# Patient Record
Sex: Male | Born: 1935 | Race: White | Hispanic: No | Marital: Married | Smoking: Former smoker
Health system: Southern US, Community
[De-identification: ages and names within clinical notes are randomized; demographics above are authoritative.]

## PROBLEM LIST (undated history)

## (undated) DIAGNOSIS — G4733 Obstructive sleep apnea (adult) (pediatric): Secondary | ICD-10-CM

## (undated) DIAGNOSIS — E785 Hyperlipidemia, unspecified: Secondary | ICD-10-CM

## (undated) DIAGNOSIS — I2510 Atherosclerotic heart disease of native coronary artery without angina pectoris: Secondary | ICD-10-CM

## (undated) DIAGNOSIS — I4821 Permanent atrial fibrillation: Secondary | ICD-10-CM

## (undated) DIAGNOSIS — Z7901 Long term (current) use of anticoagulants: Secondary | ICD-10-CM

## (undated) DIAGNOSIS — I4891 Unspecified atrial fibrillation: Secondary | ICD-10-CM

## (undated) DIAGNOSIS — I4892 Unspecified atrial flutter: Secondary | ICD-10-CM

## (undated) DIAGNOSIS — I255 Ischemic cardiomyopathy: Secondary | ICD-10-CM

## (undated) DIAGNOSIS — Z95810 Presence of automatic (implantable) cardiac defibrillator: Secondary | ICD-10-CM

## (undated) DIAGNOSIS — E119 Type 2 diabetes mellitus without complications: Secondary | ICD-10-CM

## (undated) DIAGNOSIS — J449 Chronic obstructive pulmonary disease, unspecified: Secondary | ICD-10-CM

## (undated) DIAGNOSIS — I509 Heart failure, unspecified: Secondary | ICD-10-CM

## (undated) DIAGNOSIS — N200 Calculus of kidney: Secondary | ICD-10-CM

## (undated) DIAGNOSIS — M1990 Unspecified osteoarthritis, unspecified site: Secondary | ICD-10-CM

## (undated) DIAGNOSIS — I609 Nontraumatic subarachnoid hemorrhage, unspecified: Secondary | ICD-10-CM

## (undated) DIAGNOSIS — Z9189 Other specified personal risk factors, not elsewhere classified: Secondary | ICD-10-CM

## (undated) DIAGNOSIS — I219 Acute myocardial infarction, unspecified: Secondary | ICD-10-CM

## (undated) DIAGNOSIS — I428 Other cardiomyopathies: Secondary | ICD-10-CM

## (undated) HISTORY — DX: Unspecified atrial fibrillation: I48.91

## (undated) HISTORY — DX: Obstructive sleep apnea (adult) (pediatric): G47.33

## (undated) HISTORY — PX: VASECTOMY: SHX75

## (undated) HISTORY — DX: Hyperlipidemia, unspecified: E78.5

## (undated) HISTORY — DX: Atherosclerotic heart disease of native coronary artery without angina pectoris: I25.10

## (undated) HISTORY — DX: Long term (current) use of anticoagulants: Z79.01

## (undated) HISTORY — PX: CATARACT EXTRACTION W/ INTRAOCULAR LENS  IMPLANT, BILATERAL: SHX1307

## (undated) SURGERY — CYSTOSCOPY, WITH CALCULUS REMOVAL USING BASKET
Anesthesia: Spinal | Laterality: Right

---

## 1976-04-08 HISTORY — PX: KNEE ARTHROPLASTY: SHX992

## 1996-04-08 DIAGNOSIS — I219 Acute myocardial infarction, unspecified: Secondary | ICD-10-CM

## 1996-04-08 HISTORY — DX: Acute myocardial infarction, unspecified: I21.9

## 1996-07-14 HISTORY — PX: CORONARY ANGIOPLASTY WITH STENT PLACEMENT: SHX49

## 2000-08-08 ENCOUNTER — Ambulatory Visit (HOSPITAL_COMMUNITY): Admission: RE | Admit: 2000-08-08 | Discharge: 2000-08-08 | Payer: Self-pay | Admitting: Neurology

## 2000-08-08 ENCOUNTER — Encounter: Payer: Self-pay | Admitting: Neurology

## 2001-10-15 ENCOUNTER — Encounter: Payer: Self-pay | Admitting: Emergency Medicine

## 2001-10-15 ENCOUNTER — Emergency Department (HOSPITAL_COMMUNITY): Admission: EM | Admit: 2001-10-15 | Discharge: 2001-10-15 | Payer: Self-pay | Admitting: Emergency Medicine

## 2003-01-23 ENCOUNTER — Emergency Department (HOSPITAL_COMMUNITY): Admission: EM | Admit: 2003-01-23 | Discharge: 2003-01-23 | Payer: Self-pay | Admitting: Emergency Medicine

## 2003-02-07 HISTORY — PX: CARDIAC CATHETERIZATION: SHX172

## 2003-02-08 ENCOUNTER — Ambulatory Visit (HOSPITAL_COMMUNITY): Admission: RE | Admit: 2003-02-08 | Discharge: 2003-02-09 | Payer: Self-pay | Admitting: Cardiovascular Disease

## 2003-09-13 ENCOUNTER — Ambulatory Visit (HOSPITAL_COMMUNITY): Admission: RE | Admit: 2003-09-13 | Discharge: 2003-09-13 | Payer: Self-pay | Admitting: Internal Medicine

## 2003-11-15 ENCOUNTER — Ambulatory Visit (HOSPITAL_COMMUNITY): Admission: RE | Admit: 2003-11-15 | Discharge: 2003-11-15 | Payer: Self-pay | Admitting: Family Medicine

## 2003-11-17 ENCOUNTER — Ambulatory Visit (HOSPITAL_COMMUNITY): Admission: RE | Admit: 2003-11-17 | Discharge: 2003-11-17 | Payer: Self-pay | Admitting: Family Medicine

## 2006-10-30 ENCOUNTER — Ambulatory Visit (HOSPITAL_COMMUNITY): Admission: RE | Admit: 2006-10-30 | Discharge: 2006-10-30 | Payer: Self-pay | Admitting: Family Medicine

## 2006-11-06 ENCOUNTER — Inpatient Hospital Stay (HOSPITAL_COMMUNITY): Admission: AD | Admit: 2006-11-06 | Discharge: 2006-11-08 | Payer: Self-pay | Admitting: Family Medicine

## 2006-11-07 ENCOUNTER — Ambulatory Visit: Payer: Self-pay | Admitting: Internal Medicine

## 2006-11-08 ENCOUNTER — Ambulatory Visit: Payer: Self-pay | Admitting: Gastroenterology

## 2006-11-12 ENCOUNTER — Encounter (HOSPITAL_COMMUNITY): Admission: RE | Admit: 2006-11-12 | Discharge: 2006-12-12 | Payer: Self-pay | Admitting: Internal Medicine

## 2007-04-09 HISTORY — PX: CHOLECYSTECTOMY: SHX55

## 2007-05-22 ENCOUNTER — Ambulatory Visit: Payer: Self-pay | Admitting: Cardiology

## 2007-06-19 ENCOUNTER — Ambulatory Visit: Payer: Self-pay | Admitting: Cardiology

## 2007-10-06 ENCOUNTER — Emergency Department (HOSPITAL_COMMUNITY): Admission: EM | Admit: 2007-10-06 | Discharge: 2007-10-06 | Payer: Self-pay | Admitting: Emergency Medicine

## 2007-10-16 ENCOUNTER — Emergency Department (HOSPITAL_COMMUNITY): Admission: EM | Admit: 2007-10-16 | Discharge: 2007-10-16 | Payer: Self-pay | Admitting: Emergency Medicine

## 2007-10-26 ENCOUNTER — Ambulatory Visit (HOSPITAL_COMMUNITY): Admission: RE | Admit: 2007-10-26 | Discharge: 2007-10-26 | Payer: Self-pay | Admitting: Family Medicine

## 2008-05-26 ENCOUNTER — Inpatient Hospital Stay (HOSPITAL_COMMUNITY): Admission: EM | Admit: 2008-05-26 | Discharge: 2008-05-27 | Payer: Self-pay | Admitting: Emergency Medicine

## 2008-08-10 ENCOUNTER — Ambulatory Visit (HOSPITAL_COMMUNITY): Admission: RE | Admit: 2008-08-10 | Discharge: 2008-08-10 | Payer: Self-pay | Admitting: Family Medicine

## 2008-08-14 ENCOUNTER — Observation Stay (HOSPITAL_COMMUNITY): Admission: EM | Admit: 2008-08-14 | Discharge: 2008-08-16 | Payer: Self-pay | Admitting: Emergency Medicine

## 2008-08-15 ENCOUNTER — Ambulatory Visit: Payer: Self-pay | Admitting: Gastroenterology

## 2008-08-16 ENCOUNTER — Ambulatory Visit: Payer: Self-pay | Admitting: Internal Medicine

## 2008-08-22 ENCOUNTER — Ambulatory Visit: Payer: Self-pay | Admitting: Cardiology

## 2008-08-22 ENCOUNTER — Encounter: Payer: Self-pay | Admitting: Physician Assistant

## 2008-08-23 ENCOUNTER — Ambulatory Visit (HOSPITAL_COMMUNITY): Admission: RE | Admit: 2008-08-23 | Discharge: 2008-08-23 | Payer: Self-pay | Admitting: Cardiology

## 2008-08-23 ENCOUNTER — Encounter: Payer: Self-pay | Admitting: Cardiology

## 2008-08-23 ENCOUNTER — Ambulatory Visit: Payer: Self-pay | Admitting: Cardiovascular Disease

## 2008-08-31 ENCOUNTER — Encounter (INDEPENDENT_AMBULATORY_CARE_PROVIDER_SITE_OTHER): Payer: Self-pay | Admitting: General Surgery

## 2008-08-31 ENCOUNTER — Ambulatory Visit: Payer: Self-pay | Admitting: Cardiology

## 2008-08-31 ENCOUNTER — Ambulatory Visit (HOSPITAL_COMMUNITY): Admission: RE | Admit: 2008-08-31 | Discharge: 2008-09-01 | Payer: Self-pay | Admitting: General Surgery

## 2008-09-23 ENCOUNTER — Encounter: Payer: Self-pay | Admitting: Gastroenterology

## 2008-10-07 DIAGNOSIS — E119 Type 2 diabetes mellitus without complications: Secondary | ICD-10-CM | POA: Insufficient documentation

## 2008-10-07 DIAGNOSIS — I2510 Atherosclerotic heart disease of native coronary artery without angina pectoris: Secondary | ICD-10-CM | POA: Insufficient documentation

## 2008-10-07 DIAGNOSIS — G4733 Obstructive sleep apnea (adult) (pediatric): Secondary | ICD-10-CM

## 2008-12-20 ENCOUNTER — Ambulatory Visit (HOSPITAL_COMMUNITY): Admission: RE | Admit: 2008-12-20 | Discharge: 2008-12-20 | Payer: Self-pay | Admitting: Ophthalmology

## 2009-01-03 ENCOUNTER — Ambulatory Visit (HOSPITAL_COMMUNITY): Admission: RE | Admit: 2009-01-03 | Discharge: 2009-01-03 | Payer: Self-pay | Admitting: Ophthalmology

## 2010-04-08 DIAGNOSIS — Z7901 Long term (current) use of anticoagulants: Secondary | ICD-10-CM

## 2010-04-08 HISTORY — DX: Long term (current) use of anticoagulants: Z79.01

## 2010-07-13 LAB — BASIC METABOLIC PANEL
BUN: 8 mg/dL (ref 6–23)
Calcium: 9.5 mg/dL (ref 8.4–10.5)
GFR calc non Af Amer: >60 mL/min (ref >60)
Glucose, Bld: 157 mg/dL — ABNORMAL HIGH (ref 70–99)

## 2010-07-13 LAB — GLUCOSE, CAPILLARY: Glucose-Capillary: 119 mg/dL — ABNORMAL HIGH (ref 70–99)

## 2010-07-17 LAB — POCT CARDIAC MARKERS
CKMB, poc: 3.8 ng/mL (ref 1.0–8.0)
Myoglobin, poc: 193 ng/mL (ref 12–200)
Troponin i, poc: <0.05 ng/mL (ref 0.00–0.09)

## 2010-07-17 LAB — BASIC METABOLIC PANEL
BUN: 18 mg/dL (ref 6–23)
CO2: 26 mEq/L (ref 19–32)
CO2: 30 mEq/L (ref 19–32)
CO2: 28 mEq/L (ref 19–32)
Calcium: 8.8 mg/dL (ref 8.4–10.5)
Chloride: 101 mEq/L (ref 96–112)
Chloride: 101 mEq/L (ref 96–112)
Chloride: 103 mEq/L (ref 96–112)
Creatinine, Ser: 0.88 mg/dL (ref 0.4–1.5)
Creatinine, Ser: 1.07 mg/dL (ref 0.4–1.5)
GFR calc Af Amer: >60 mL/min (ref >60)
GFR calc Af Amer: >60 mL/min (ref >60)
GFR calc non Af Amer: >60 mL/min (ref >60)
GFR calc non Af Amer: >60 mL/min (ref >60)
Glucose, Bld: 120 mg/dL — ABNORMAL HIGH (ref 70–99)
Glucose, Bld: 158 mg/dL — ABNORMAL HIGH (ref 70–99)
Potassium: 4.5 mEq/L (ref 3.5–5.1)
Potassium: 4.9 mEq/L (ref 3.5–5.1)
Potassium: 5.1 mEq/L (ref 3.5–5.1)
Sodium: 138 mEq/L (ref 135–145)

## 2010-07-17 LAB — HEPATIC FUNCTION PANEL
ALT: 40 U/L (ref 0–53)
Albumin: 3.7 g/dL (ref 3.5–5.2)
Albumin: 3.1 g/dL — ABNORMAL LOW (ref 3.5–5.2)
Alkaline Phosphatase: 43 U/L (ref 39–117)
Alkaline Phosphatase: 59 U/L (ref 39–117)
Bilirubin, Direct: 0.2 mg/dL (ref 0.0–0.3)
Bilirubin, Direct: 0.2 mg/dL (ref 0.0–0.3)
Bilirubin, Direct: 0.1 mg/dL (ref 0.0–0.3)
Indirect Bilirubin: 0.3 mg/dL (ref 0.3–0.9)
Indirect Bilirubin: 0.6 mg/dL (ref 0.3–0.9)
Total Bilirubin: 0.5 mg/dL (ref 0.3–1.2)
Total Bilirubin: 0.8 mg/dL (ref 0.3–1.2)
Total Bilirubin: 0.3 mg/dL (ref 0.3–1.2)

## 2010-07-17 LAB — COMPREHENSIVE METABOLIC PANEL
AST: 50 U/L — ABNORMAL HIGH (ref 0–37)
Albumin: 3.7 g/dL (ref 3.5–5.2)
Calcium: 9.8 mg/dL (ref 8.4–10.5)
Creatinine, Ser: 1.81 mg/dL — ABNORMAL HIGH (ref 0.4–1.5)
GFR calc Af Amer: 45 mL/min — ABNORMAL LOW (ref >60)
Total Protein: 5.8 g/dL — ABNORMAL LOW (ref 6.0–8.3)

## 2010-07-17 LAB — DIFFERENTIAL
Basophils Absolute: 0 10*3/uL (ref 0.0–0.1)
Basophils Relative: 0 % (ref 0–1)
Basophils Relative: 0 % (ref 0–1)
Eosinophils Absolute: 0.2 10*3/uL (ref 0.0–0.7)
Eosinophils Absolute: 0.2 10*3/uL (ref 0.0–0.7)
Eosinophils Relative: 2 % (ref 0–5)
Eosinophils Relative: 3 % (ref 0–5)
Eosinophils Relative: 1 % (ref 0–5)
Eosinophils Relative: 3 % (ref 0–5)
Lymphocytes Relative: 22 % (ref 12–46)
Lymphocytes Relative: 13 % (ref 12–46)
Lymphs Abs: 1.3 10*3/uL (ref 0.7–4.0)
Lymphs Abs: 2.9 10*3/uL (ref 0.7–4.0)
Lymphs Abs: 2.9 10*3/uL (ref 0.7–4.0)
Monocytes Absolute: 0.5 10*3/uL (ref 0.1–1.0)
Monocytes Absolute: 0.6 10*3/uL (ref 0.1–1.0)
Monocytes Absolute: 0.6 10*3/uL (ref 0.1–1.0)
Monocytes Absolute: 0.8 10*3/uL (ref 0.1–1.0)
Monocytes Relative: 8 % (ref 3–12)
Monocytes Relative: 11 % (ref 3–12)
Monocytes Relative: 9 % (ref 3–12)
Neutro Abs: 4.6 10*3/uL (ref 1.7–7.7)
Neutro Abs: 8.2 10*3/uL — ABNORMAL HIGH (ref 1.7–7.7)
Neutrophils Relative %: 53 % (ref 43–77)
Neutrophils Relative %: 54 % (ref 43–77)

## 2010-07-17 LAB — HEMOGLOBIN A1C
Hgb A1c MFr Bld: 9.2 % — ABNORMAL HIGH (ref 4.6–6.1)
Mean Plasma Glucose: 217 mg/dL
Mean Plasma Glucose: 217 mg/dL

## 2010-07-17 LAB — CBC
HCT: 38.5 % — ABNORMAL LOW (ref 39.0–52.0)
HCT: 41.5 % (ref 39.0–52.0)
Hemoglobin: 13.9 g/dL (ref 13.0–17.0)
Hemoglobin: 14.4 g/dL (ref 13.0–17.0)
Hemoglobin: 14.7 g/dL (ref 13.0–17.0)
MCHC: 35.5 g/dL (ref 30.0–36.0)
MCHC: 34.5 g/dL (ref 30.0–36.0)
MCHC: 34.7 g/dL (ref 30.0–36.0)
MCV: 99.2 fL (ref 78.0–100.0)
MCV: 99.3 fL (ref 78.0–100.0)
MCV: 99.5 fL (ref 78.0–100.0)
Platelets: 156 10*3/uL (ref 150–400)
RBC: 4.34 MIL/uL (ref 4.22–5.81)
RBC: 4.18 MIL/uL — ABNORMAL LOW (ref 4.22–5.81)
RBC: 4.21 MIL/uL — ABNORMAL LOW (ref 4.22–5.81)
RDW: 13.5 % (ref 11.5–15.5)
RDW: 13.2 % (ref 11.5–15.5)
WBC: 10.1 10*3/uL (ref 4.0–10.5)
WBC: 7.4 10*3/uL (ref 4.0–10.5)

## 2010-07-17 LAB — GLUCOSE, CAPILLARY
Glucose-Capillary: 120 mg/dL — ABNORMAL HIGH (ref 70–99)
Glucose-Capillary: 125 mg/dL — ABNORMAL HIGH (ref 70–99)
Glucose-Capillary: 174 mg/dL — ABNORMAL HIGH (ref 70–99)
Glucose-Capillary: 157 mg/dL — ABNORMAL HIGH (ref 70–99)
Glucose-Capillary: 160 mg/dL — ABNORMAL HIGH (ref 70–99)
Glucose-Capillary: 162 mg/dL — ABNORMAL HIGH (ref 70–99)

## 2010-07-17 LAB — PROTIME-INR
INR: 1.1 (ref 0.00–1.49)
Prothrombin Time: 14.4 seconds (ref 11.6–15.2)

## 2010-07-17 LAB — URINALYSIS, ROUTINE W REFLEX MICROSCOPIC
Nitrite: NEGATIVE
Protein, ur: NEGATIVE mg/dL
Specific Gravity, Urine: >1.03 — ABNORMAL HIGH (ref 1.005–1.030)
Urobilinogen, UA: 0.2 mg/dL (ref 0.0–1.0)

## 2010-07-17 LAB — LIPASE, BLOOD: Lipase: 18 U/L (ref 11–59)

## 2010-08-21 NOTE — Group Therapy Note (Signed)
NAMEMARGIE, BRINK                  ACCOUNT NO.:  [id number]   MEDICAL RECORD NO.:  [id number]          PATIENT TYPE:  INP   LOCATION:  IC02                          FACILITY:  [facility]   PHYSICIAN:  [name], MD   DATE OF BIRTH:  [birth year]   DATE OF PROCEDURE:  DATE OF DISCHARGE:                                 PROGRESS NOTE   This patient was admitted with bronchitis, COPD, subconjunctival  hemorrhage left eye.  He continues to have some cough and congestion,  upper respiratory passages   OBJECTIVE:  VITAL SIGNS:  Blood pressure 101/58, respirations 18, pulse  71, temperature 98.  LUNGS:  Rhonchi and wheezes bilaterally.  HEART:  Regular rhythm.  ABDOMEN:  No palpable organs or masses.   ASSESSMENT:  The patient was admitted with subconjunctival hemorrhage  left eye, bronchitis, and emphysema.   PLAN:  To continue current regimen.      [name], MD  Electronically Signed     AGM/MEDQ  D:  05/27/2008  T:  05/27/2008  Job:  [id number]

## 2010-08-21 NOTE — H&P (Signed)
[name], [name]                  ACCOUNT NO.:  [id number]   MEDICAL RECORD NO.:  [id number]           PATIENT TYPE:  INP   LOCATION:  IC02                          FACILITY:  [facility]   PHYSICIAN:  [name], MD   DATE OF BIRTH:  Aug 26, 1934   DATE OF ADMISSION:  DATE OF DISCHARGE:  LH                              HISTORY & PHYSICAL   A 76 year old white male was brought to the hospital with pain in left  eye and left ear and upper respiratory congestion.  He was seen in  emergency room and evaluated.  Because of his breathing problems, it was  felt he should be admitted to the hospital.  X-rays of chest showed  chronic changes without acute pulmonary disease, borderline heart size.  CT of the head, no acute intracranial abnormality, mild atrophy and  chronic ischemic white matter disease, and chronic right mastoiditis.  The patient has been coughing some as well.   SOCIAL HISTORY:  The patient does not smoke now or drink.   PAST MEDICAL HISTORY:  The patient has a history of coronary artery  disease, hypertension, and previous myocardial infarction.   SURGICAL HISTORY:  The patient has history of coronary stent, knee  surgery, and vasectomy.   ALLERGIES:  NOVOCAIN, nausea and vomiting.   MEDICATION LIST:  1. Metoprolol 25 mg b.i.d.  2. Niaspan 500 mg daily.  3. Simvastatin 40 mg daily.  4. Plavix 75 mg daily.  5. Vitamin E 400 mg daily.  6. Lisinopril 20 mg daily.  7. Fish oil 1000 mg daily.  8. Robitussin every 4-8 hours.   REVIEW OF SYSTEMS:  HEENT:  Negative.  CARDIOPULMONARY:  The patient has  had increased cough and dyspnea for several days.  GI:  No bowel  irregularity or bleeding.  GU:  No dysuria or hematuria.   PHYSICAL EXAMINATION:  VITAL SIGNS:  Blood pressure 101/58, respirations  18, pulse 71, temperature 98.  HEENT:  Eyes:  PERRLA.  The patient has subconjunctival hemorrhage in  the left eye.  NECK:  Supple.  No JVD or thyroid abnormalities.  HEART:   Regular rhythm.  No murmurs.  LUNGS:  Rhonchi and wheezes bilaterally.  ABDOMEN:  No palpable organs or masses.   ASSESSMENT:  The patient was admitted to hospital with:  1. Subconjunctival hemorrhage, left eye.  2. Upper respiratory infection.  3. Bronchitis, asthmatic type.  4. Left base atelectasis.  5. Chronic obstructive pulmonary disease.      [name], MD  Electronically Signed     AGM/MEDQ  D:  05/27/2008  T:  05/27/2008  Job:  [id number]

## 2010-08-21 NOTE — Consult Note (Signed)
NAMEBALRAJ, [name]                  ACCOUNT NO.:  [id number]   MEDICAL RECORD NO.:  [id number]          PATIENT TYPE:  INP   LOCATION:  A206                          FACILITY:  [facility]   PHYSICIAN:  R. Roetta Sessions, M.D. DATE OF BIRTH:  1934-10-20   DATE OF CONSULTATION:  11/07/2006  DATE OF DISCHARGE:                                 CONSULTATION   REASON FOR CONSULTATION:  Epigastric pain, nausea, vomiting.   HISTORY OF PRESENT ILLNESS:  [name] is a very pleasant 76-year-  old Caucasian male who was in his usual state of health until three days  ago when he developed rather acute onset of epigastric medial right  upper quadrant abdominal pain with nausea and belching.  He denies  actually frank vomiting, although he was nauseated.  Symptoms have waxed  and waned over the past couple of days.  States he has been unable to  eat.  When he eats, these symptoms worsen.  He has not had any melena or  hematochezia.  He had a couple of loose stools but denies any  significant watery diarrhea.  He has not had a fever or chills.  Denies  yellow jaundice, clay-colored stools, dark-colored urine.  He had  apparently an upper respiratory tract infection with some congestion a  week earlier, which resolved.   Prior to this week, [name] denies any problems with abdominal pain,  nausea, vomiting, or change in bowel habits.  His appetite previously  has been well maintained, and he continues to be hungry now, although he  has distress if he tries to eat as outlined above.  Back in 2005 he had  a gallbladder ultrasound which was negative.  A CT abdomen and pelvis  this admission failed to demonstrate anything acute or anything that  would explain his current symptoms.  He did have a fatty-appearing liver  on the scan.  He denies reflux symptoms, odynophagia and dysphagia.  There is no history of peptic ulcer disease.  There has been no change  in medications.  He denies NSAID use aside from  one aspirin a day.  The  has been taking Niaspan for the past three years but there has been no  change in that regimen.   Admitting laboratory data did reveal slightly elevated white count of  12.5, H&H of 16.5 and 49.3.  His LFTs were all normal except for  slightly elevated AST of 50 on 11/06/2006.  Cardiac enzymes were normal.  PSA normal at 1.16.   PAST MEDICAL HISTORY:  Coronary artery disease, status post MI in 1998.  He has multivessel coronary disease.   PAST SURGICAL HISTORY:  Left knee surgery in 1970, vasectomy in 1968,  colonoscopy by me with removal of multiple colonic polyps 2005, both  adenomatous and inflammatory.  He is due for surveillance exam in 2010.   MEDICATIONS ON ADMISSION:  Vytorin, Niaspan, aspirin, isosorbide,  verapamil, Plavix.   DRUG ALLERGIES:  No known drug allergies.   FAMILY HISTORY:  Negative for chronic GI or liver illness.   SOCIAL HISTORY:  Patient  does not smoke.  He does not use alcohol or  illicit drugs.  He is married and has a very supportive family.   REVIEW OF SYSTEMS:  Has had recent dyspnea and chest pain on exertion.  No fever or chills.  No change in weight.  Has no clay-colored stools,  dark-colored urine.   PHYSICAL EXAMINATION:  GENERAL:  Alert, conversant gentleman in no acute  distress, accompanied by numerous family members in Room 206.  VITAL SIGNS:  Admission weight 94.9 kg.  Temperature 97.1, pulse 62,  respirations 16, blood pressure 117/63, O2 saturation 95% on room air.  HEENT:  No scleral icterus.  Conjunctivae are pink.  JVD is not  prominent.  Oral cavity with no lesions.  CHEST:  Lungs are clear to auscultation.  CARDIAC:  Regular rate and rhythm without murmur, gallop or rub.  ABDOMEN:  Obese.  Positive bowel sounds.  He has localized  epigastric/right upper quadrant tenderness to palpation.  No obvious  mass or organomegaly.  EXTREMITIES:  No edema.   ADDITIONAL LABORATORY DATA:  Sodium 132, potassium 4.7,  chloride 94, CO2  of 29, glucose 197, BUN 20, creatinine 0.097.  Bilirubin 0.8, alkaline  phosphatase 61, AST 47, total protein 7.1, albumin 4.0.  H&H of 16.5,  49.3, MCV 97.3, platelet count 267,000.   IMPRESSION:  [name] is a very pleasant 76 year old gentleman  admitted to the hospital with fairly acute onset of nausea, vomiting,  epigastric right upper quadrant abdominal pain.  Certainly his symptoms  are exacerbated by eating.  I find his symptoms suspicious for occult  biliary tract disease.  CT of the abdomen did not demonstrate any  obvious explanation for his symptoms.  I doubt he has luminal pathology  in his upper GI tract to account for his symptoms.  He has isolated  elevation of his SGOT which is fairly nonspecific.  There is no evidence  of pancreatitis on recent CT scanning.   RECOMMENDATIONS:  Proceed with a gallbladder ultrasound to evaluate for  occult gallbladder disease.  CT may miss a good 30% of stone disease.  Will go ahead and recheck a hepatic profile now and obtain a serum  amylase and lipase.  He has been started on Protonix 40 mg IV q.12h.  I  agree with this approach for the time being.  Hopefully we can drop him  back to once daily therapy in the near future.  Further recommendations  to follow.  Dr. Cira Servant will be looking in on [name] as needed in my  absence over the weekend.   I would like to thank Dr. Butch Penny for allowing me to see this nice  gentleman.      [name], M.D.  Electronically Signed     RMR/MEDQ  D:  11/07/2006  T:  11/07/2006  Job:  [id number]   cc:   [name], MD  Fax: [phone]

## 2010-08-21 NOTE — Group Therapy Note (Signed)
NAMEMONICA, [name]                  ACCOUNT NO.:  [id number]   MEDICAL RECORD NO.:  [id number]          PATIENT TYPE:  INP   LOCATION:  A206                          FACILITY:  [facility]   PHYSICIAN:  [name], MD   DATE OF BIRTH:  [birth year]   DATE OF PROCEDURE:  DATE OF DISCHARGE:                                 PROGRESS NOTE   This patient was admitted to hospital yesterday with episodes of nausea,  vomiting and mild epigastric pain.  This has been persistent for several  days and he continues to have nausea and episodes of vomiting.   OBJECTIVE:  VITAL SIGNS:  Blood pressure 118/74, respirations 24,  temperature 96.9.   The patient's lab work: CBC 12.5, hemoglobin 16.5, hematocrit 49.3.  Chemistries: Sodium 132, potassium 4.7, chloride 94, CO2 29, glucose  197, BUN 20, creatinine 0.97.  Liver enzymes within normal limits.  Cardiac markers within normal limits.   LUNGS:  Diminished breath sounds bilaterally.  HEART:  Regular rhythm.  ABDOMEN:  Slightly distended with fullness in right upper quadrant.   ASSESSMENT:  Patient admitted with abdominal pain, nausea, vomiting and  He does have a slightly low serum potassium, slightly elevated white  count. Abdominal CT scans pending.   PLAN:  Continue current regimen.  Will obtain reports on abdominal CT  scan, continue IV fluids.  Will replace potassium.      [name], MD  Electronically Signed     AGM/MEDQ  D:  11/07/2006  T:  11/07/2006  Job:  [id number]

## 2010-08-21 NOTE — Consult Note (Signed)
NAMEDONALD, [name]                  ACCOUNT NO.:  [id number]   MEDICAL RECORD NO.:  [id number]          PATIENT TYPE:  INP   LOCATION:  A328                          FACILITY:  [facility]   PHYSICIAN:  Kassie Mends, M.D.      DATE OF BIRTH:  08-10-1934   DATE OF CONSULTATION:  08/15/2008  DATE OF DISCHARGE:                                 CONSULTATION   REFERRING PHYSICIAN:  Angus G. McInnis, MD.   REASON FOR CONSULTATION:  Right upper quadrant pain, nausea and  vomiting.   HISTORY OF PRESENT ILLNESS:  The patient is a pleasant 76 year old obese  Caucasian gentleman who was admitted with a 2-3 day history of  persistent nausea, vomiting and right upper quadrant pain.  He states he  has had some right upper quadrant pain which radiates around the right  flank and into his back for several months.  The family says up to a  year.  He tells me usually his pain is brought on by movement.  He has  more pain if he goes down stairs stepping with the right foot first.  He  has more pain with bowel movements and when he tries to reach around and  clean himself.  He really denies a postprandial component.  He does  usually have some nausea associated with the pain, according to him and  the family.  More recently, his symptoms have been frequent.  He has had  a couple of episodes that seem to be intensified postprandially.  He had  a gallbladder ultrasound last week by Dr. [name] and had a borderline  thick gallbladder wall but no pericholecystic fluid or Murphy's sign.  He also had right rib films which were negative for any significant  findings.  The patient had some blood work as an outpatient but we do  not have these for review at this time.  Friday evening he states that  he went to [employer] and had burgers, fries, apple pie.  He went to bed  that evening.  He woke up the next morning to do a job and felt sick on  his stomach.  He felt more nauseated throughout the day.  He eventually  ate  a hotdog and shortly after that he started vomiting.  He vomited  several times on Saturday.  Sunday he tried to go out to eat for  Mother's Day and shortly after he started having dry heaves.  He noted  that his urine was very dark colored and malodorous.  He denies any  burning or gross hematuria.  He has had a history of urinary frequency  and incontinence, but states that Vesicare has been helpful in  controlling those symptoms.  He is currently on sulfamethoxazole for 14  days for a possible prostate infection.   He denies any fever or chills.  He presented to the emergency department  with intractable vomiting and diaphoresis.  Upon evaluation he was found  to have a normal white count and hemoglobin.  His AST was slightly high  at 50 and this has been seen  back in 2008 as well.  He has a history of  fatty liver.  His creatinine was up at 1.81, his glucose was 298, his  sodium 127.  Lipase was normal.  Today, his sodium is better at 131, his  creatinine is down to 1.18, his glucose is 158.  He had a urinalysis  that showed small bilirubin, trace ketones.  He had a CT this morning  but results are pending.   MEDICATIONS AT HOME:  1. Niaspan 500 mg nightly.  2. Sulfamethoxazole b.i.d.  3. Zocor 40 mg daily.  4. Oxycodone 5/325 mg every 4 hours as needed.  5. Plavix 75 mg daily.  6. Lisinopril 20 mg daily.  7. Metoprolol 25 mg b.i.d.  8. Vesicare 5 mg daily.  9. Fish Oil 1000 mg daily.  10.Vitamin E 400 IU daily.   ALLERGIES:  NOVOCAIN causes nausea and vomiting.   PAST MEDICAL HISTORY:  1. Hypertension.  2. Hyperlipidemia.  3. CAD status post MI in 1998 with stenting in 1998 and 2004.  4. Apparently, diabetes undiagnosed.  5. Knee surgery in the 1970s.  6. Vasectomy.  7. Admission in February 2010 for bronchitis.  8. Colonoscopy in 2005, he had adenomatous polyps and is due for      surveillance in 2010.  The patient states he had a colonoscopy last      year, but I am  not sure if this is true or not.   FAMILY HISTORY:  Negative for chronic GI illnesses, liver disease.   SOCIAL HISTORY:  He is married, he has 3 adult children.  He quit  smoking in 1994.  No alcohol use.  He works with sound systems locally.   REVIEW OF SYSTEMS:  See HPI for GI.  In addition, he denies any  heartburn, dysphagia, or odynophagia.  His bowel movements generally  occur twice daily.  No blood in the stool or melena.  GU:  See HPI.  CARDIOPULMONARY:  Denies shortness of breath or chest pain.   PHYSICAL EXAMINATION:  Temperature 98, pulse 72, respirations 20, blood  pressure is 93/55, O2 saturations 96% on room air.  Height 64 inches,  weight 94.3 kg.  GENERAL:  A pleasant, obese Caucasian gentleman in no acute distress.  He is accompanied by his wife, daughter and grandson.  SKIN:  Warm and dry, no jaundice.  HEENT:  Sclerae nonicteric.  Oropharyngeal mucosa moist and pink.  No  lesions, erythema or exudate.  No lymphadenopathy, thyromegaly.  CHEST:  Lungs are clear to auscultation.  CARDIAC EXAM:  Reveals regular rate and rhythm.  No murmurs, rubs or  gallops.  ABDOMEN:  Obese, positive bowel sounds.  He has ventral weakness with  some herniation, nontender, easily reducible.  He has mild right upper  quadrant tenderness to deep palpation.  Do not appreciate organomegaly  or masses but limited due to body habitus.  No abdominal bruits.  LOWER EXTREMITIES:  No edema.   LABS:  As outlined above.   IMPRESSION:  The patient is a 76 year old gentleman with right upper  quadrant abdominal discomfort, nausea and vomiting.  His right upper  quadrant abdominal pain has been occurring for several months, often  associated with movement or bowel movement.  More recently, may have  been associated with meals.  He has had recent right upper quadrant pain  which he describes as mild, associated with intractable vomiting.  Differential diagnosis at this point of his abdominal pain  would include  musculoskeletal pain,  underlying gallbladder disease.  Nausea, vomiting  may be related to foodborne illness for recurrent episode or even  underlying gastroparesis, biliary disease.  Recent abdominal ultrasound  failed to show acute cholecystitis.  He had a borderline gallbladder  wall thickness, but no other signs to indicate acute cholecystitis.   He appears to have undiagnosed diabetes.  His glucose was up on  admission and was up back in 2008 as well.   Fatty liver with elevated AST in the 50 range.  This was also 50 in July  of 2008.   RECOMMENDATIONS:  1. Will follow up CT findings and make further recommendations.  Based      on findings, he may need to have a HIDA scan plus or minus EGD to      sort out his symptoms.  2. Hemoglobin A1c.  3. PPI.  4. Will recheck his LFTs in the morning.   I would like to thank Dr. Butch Penny for allowing [place] to take part in  the care of this patient.   ADDENDUM [id number]:  OPV in AUG 2010, RE: abd pain      Tana Coast, P.A.      Kassie Mends, M.D.  Electronically Signed    LL/MEDQ  D:  08/15/2008  T:  08/15/2008  Job:  [id number]   cc:   [name], MD  Fax: [phone]

## 2010-08-21 NOTE — Group Therapy Note (Signed)
[name], [name]                  ACCOUNT NO.:  [id number]   MEDICAL RECORD NO.:  [id number]          PATIENT TYPE:  INP   LOCATION:  A328                          FACILITY:  [facility]   PHYSICIAN:  [name], MD   DATE OF BIRTH:  12-25-1934   DATE OF PROCEDURE:  DATE OF DISCHARGE:                                 PROGRESS NOTE   This patient was admitted to the hospital with abdominal pain, nausea,  and vomiting.  Pain this morning has resolved some.  Pain was  characterized as right upper quadrant pain, sharp, and stabbing.  He did  apparently have elevated blood sugar early as well but on today's labs  sugar was 158, it had been 298 on admission.  CBC, WBC 10,100 with  hemoglobin 15.5, hematocrit 44.9.  A prior ultrasound showed borderline  wall thickening and gallbladder with absent Murphy sign.   OBJECTIVE:  VITAL SIGNS:  Blood pressure 98/68, respirations 20, pulse  72, temp 97.0.  LUNGS:  Clear to P&A.  HEART:  Regular rhythm.  No murmurs.  ABDOMEN:  No palpable organs or masses.   ASSESSMENT:  The patient was admitted with upper abdominal pain right  upper quadrant, acute gastritis to rule out underlying gallbladder  disease.   PLAN:  To obtain GI consult.  Continue current intravenous fluids.      [name], MD  Electronically Signed     AGM/MEDQ  D:  08/15/2008  T:  08/15/2008  Job:  [id number]

## 2010-08-21 NOTE — Letter (Signed)
May 22, 2007    [name], MD  [address]  [place], [place] [address]   RE:  CEVIN, RUBINSTEIN  MRN:  [id number]  /  DOB:  [birth year]   Dear Thalia Party:   Mr. Tumbleson was evaluated in the office in consultation today at your kind  request.  As you know, this nice gentleman presented with an acute  myocardial infarction in 1998 requiring stent placement in the proximal  LAD and percutaneous angioplasty of the circumflex.  An abnormal stress  test in 2004 lead to repeat coronary angiography revealing a mid 95% LAD  lesion and a 70% RCA stenosis.  He was subsequently scheduled for  intervention.  At that time, there had been substantial spontaneous  improvement in the LAD.  A stent was placed in the RCA.  He has done  well with no cardiopulmonary symptoms since.  He discontinued followup  with [facility] as the result of a billing dispute.   His cardiovascular risk factors include only dyslipidemia and obesity.  He does not recall being told of hypertension.  He has had no problems  with diabetes.  He was last admitted to the hospital in mid 2008 with GI  symptoms.  These resolved spontaneously.  Admission labs at that time  revealed sodium of 132, glucose of 197, normal LFTs and normal cardiac  markers.   PAST MEDICAL HISTORY:  Past medical history is notable for a vasectomy  and left knee surgery in 1970.   ALLERGIES:  The patient reports an allergy to NOVOCAIN.   CURRENT MEDICATIONS:  1. Include simvastatin 40 mg daily.  2. Niaspan 500 mg daily at bedtime.  3. Verapamil 120 mg daily.  4. Metoprolol 25 mg b.i.d.  5. Isosorbide 20 mg b.i.d.  6. Clopidogrel 75 mg daily.  7. Lisinopril 20 mg daily.   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  He exercises regularly  at the gym.  He is married with 3 adult children.   FAMILY HISTORY:  Father died at advanced age with myocardial  infarction.  Mother died in her 98s with congestive heart failure.  Of 8 siblings,  all are deceased.  Three had coronary disease, 1 neoplastic disease and  1 pneumonia.   REVIEW OF SYSTEMS:  Is positive for the need for corrective lenses,  stable weight and appetite.  All other systems reviewed and are  negative.   PHYSICAL EXAMINATION:  GENERAL:  On exam, a pleasant, garrulous  gentleman in no acute distress.  VITAL SIGNS:  The weight is 223, blood pressure 100/70, heart rate 75  and regular, respirations 18.  NECK:  No jugular venous distention; normal carotid upstrokes without  bruits.  HEENT:  Anicteric sclerae; normal lids and conjunctivae; normal oral  mucosa.  LUNGS:  Clear.  CARDIOVASCULAR:  Normal first and second heart sounds; fourth heart  sound present.  ABDOMEN:  Soft and nontender; obese; no organomegaly.  EXTREMITIES:  No edema; distal pulses intact.  NEUROLOGICAL:  Symmetric strength and tone; normal cranial nerves.  SKIN:  No significant lesions.  ENDOCRINE:  No thyromegaly.  HEMATOPOIETIC:  No adenopathy.   EKG:  Normal sinus rhythm; borderline left atrial abnormality; prior  anteroseptal myocardial infarction.  No prior tracing for comparison.   IMPRESSION:  Mr. Fitzgerald is doing quite well overall.  In the absence of a  history of hypertension and with a quite low  blood pressure and no  cardiac symptoms, he is probably taking more medication than is  absolutely necessary.  We will stop isosorbide and verapamil and have  him monitor blood pressure.  A last fasting lipid profile and metabolic  profile will be  obtained as well as a hemoglobin A1c level.  I will reassess this nice  gentleman in 1 month and then annually thereafter.   Thanks so much for sending him to see me.    Sincerely,      [name], MD, [facility]  Electronically Signed    RMR/MedQ  DD: 05/22/2007  DT: 05/24/2007  Job #: [id number]

## 2010-08-21 NOTE — Group Therapy Note (Signed)
NAMEARISTIDIS, TALERICO                  ACCOUNT NO.:  [id number]   MEDICAL RECORD NO.:  [id number]          PATIENT TYPE:  INP   LOCATION:  A206                          FACILITY:  [facility]   PHYSICIAN:  [name], MD   DATE OF BIRTH:  December 22, 1934   DATE OF PROCEDURE:  DATE OF DISCHARGE:  11/08/2006                                 PROGRESS NOTE   This patient was admitted to hospital with episodes of nausea,  eructation, and epigastric discomfort.  The patient has had a CT, which  was essentially normal.  Also ultrasound of abdomen was essentially  normal with no evidence of gallstones.  Lipase was 25, amylase was 42.  The patient has been seen by gastroenterology service as well remains on  IV fluids.  Seems to be feeling some better.   OBJECTIVE:  VITAL SIGNS:  Blood pressure 128/80, respirations 24, pulse  72, temperature 97.2.  LUNGS:  Clear to P&A.  HEART:  Regular rhythm.  ABDOMEN:  No palpable organs or masses   ASSESSMENT:  Patient admitted with vomiting, epigastric discomfort,  probable gastritis.  The patient does have a history of coronary artery  disease and myocardial infarction in 1998.   PLAN:  To advance diet and attempt to get patient discharged.      [name], MD  Electronically Signed     AGM/MEDQ  D:  11/08/2006  T:  11/08/2006  Job:  [id number]

## 2010-08-21 NOTE — Group Therapy Note (Signed)
NAMEZACHARIUS, FUNARI                  ACCOUNT NO.:  [id number]   MEDICAL RECORD NO.:  [id number]          PATIENT TYPE:  INP   LOCATION:  A328                          FACILITY:  [facility]   PHYSICIAN:  [name], MD   DATE OF BIRTH:  1934-05-24   DATE OF PROCEDURE:  DATE OF DISCHARGE:                                 PROGRESS NOTE   The patient was admitted to the hospital with abdominal pain, nausea,  and vomiting.  Pain was characterized as right upper quadrant pain,  sharp, and stabbing.  He did have elevated blood sugar.  The patient is  showing improvement.  He did have CT of the abdomen done yesterday,  which showed fatty infiltration of liver.  No acute intra-abdominal  abnormalities.  Prior ultrasound showed thickening of gallbladder wall  without evidence of stones.   OBJECTIVE:  VITAL SIGNS:  Blood pressure 101/58, respiration 20, pulse  65, and temp 97.6.   LABORATORY DATA:  WBC 7.4 with 14.4 hemoglobin and 41.5 hematocrit.  Sugars ranged from 157-186.  Chemistries remain within normal range.  Hemoglobin A1c is 9.2.   ASSESSMENT:  The patient is being evaluated for right upper quadrant  pain, recent acute nausea and vomiting, rule out underlying gallbladder  disease.  The patient is being scheduled for hepatobiliary scan today.  Continue current regimen, otherwise.      [name], MD  Electronically Signed     AGM/MEDQ  D:  08/16/2008  T:  08/16/2008  Job:  [id number]

## 2010-08-21 NOTE — Letter (Signed)
June 19, 2007    [name], MD  [address]  [place], [place] [address]   RE:  [name], [name]  MRN:  [id number]  /  DOB:  1934-06-16   Dear Thalia Party:   Mr. Able returns to the office for continued management of  cardiovascular risk factors.  Since his last visit, he has done well.  He has recorded blood pressures with systolics ranging from 120-140 and  diastolics from 70-91.  He notes no symptoms.  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  He can walk hills or walk  up a flight of stairs.  He does yard work without difficulty.   MEDICATIONS:  Unchanged from his last visit except for discontinuation  of verapamil and isosorbide.   PHYSICAL EXAMINATION:  VITAL SIGNS:  Weight is 228, 5 pounds more than  at his last visit.  Blood pressure 100/70, heart rate 65 and regular,  respirations 18.  NECK:  No jugular venous distention; no carotid bruits.  LUNGS:  Clear.  CARDIAC:  Normal first and second heart sounds; fourth heart sound  present.  ABDOMEN:  Obese; otherwise benign.  EXTREMITIES:  No edema.   IMPRESSION:  Mr. Barham is doing well on an abbreviated medical regime.  I am not inclined to reduce his medications further.  Control of  hyperlipidemia is excellent.  I suggested that he  discontinue vitamin E and we will plan to see this nice gentleman again  in 1 year.  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  I suspect he will need a stress test, but he can  take that up with the examiner should he proceed with an application for  a commercial driver's license.    Sincerely,      [name], MD, [facility]  Electronically Signed    RMR/MedQ  DD: 06/19/2007  DT: 06/20/2007  Job #: [id number]

## 2010-08-21 NOTE — H&P (Signed)
NAMERAYWOOD, WAILES                  ACCOUNT NO.:  [id number]   MEDICAL RECORD NO.:  [id number]          PATIENT TYPE:  INP   LOCATION:  A328                          FACILITY:  [facility]   PHYSICIAN:  Mila Homer. Sudie Bailey, M.D.DATE OF BIRTH:  [birth year]   DATE OF ADMISSION:  08/14/2008  DATE OF DISCHARGE:  LH                              HISTORY & PHYSICAL   HISTORY OF PRESENT ILLNESS:  This 76 year old had developed right upper  quadrant abdominal pain mainly noticed after eating, starting at least 3-  6 months prior to admission.  Some of this may have gone on longer since  he was admitted to this hospital in July 2008 with nausea, vomiting, and  epigastric discomfort.  However, at that time it was felt that he had  gastritis and gastroduodenitis.  At that time, his gallbladder  ultrasound was negative and a HIDA scan was discussed.   The patient did see Dr. [name], his LMD, in the office about 5 days  ago, blood work and a gallbladder ultrasound were ordered.  This showed  diffuse fatty infiltration of the liver, and borderline thickening of  the bladder wall without sonographic Murphy sign or pericholecystic  fluid.  He was due to have a followup with Dr. [name] 2 days ago,  particularly if symptoms persisted, however, the patient deferred on  this and then that night developed nausea and vomiting along with the  abdominal pain that persisted yesterday and it was so severe today that  his daughter called me, Sunday morning Mother's Day, and conveyed his  symptoms to me.  In my function as on-call physician for Dr. [name], I  recommended he be seen immediately at [facility]  Emergency Department for further workup.   The patient has a fairly complicated medical history.  He had a major  anterior myocardial infarction in 1998.  He had left anterior descending  stenting for this.  He has had left ventricular dysfunction with  ejection fraction of 30%, no evidence of  stress test in May 2000.  In  2004, he also had abnormal Cardiolite study and found to have 70% right  coronary artery blockage, treated with a CYPHER stent.   Other medical problems include benign essential hypertension,  hyperlipidemia, and obesity.   The patient gives more symptoms today.  He said he has been peeing all  over myself.  The last apparently few months he was seen by Urology for  workup of this last week.  He has been thirsty.  He has had polyuria and  polydipsia.  He also notes he had a sister who had diabetes, went on  developing an infection of her toe, lost her foot, and eventually died  of complications of diabetes.   Other than being weak, he has had no CNS symptomatology.  He notes he  has for some time had 5  bowel movements a day.  He has had urinary  symptoms as mentioned above.  He has had no real problem with starting  and stopping his urine, but has had more of a  problem with holding  urine, but urinates copiously.  He has had no palpitations or chest pain  along with all of this.  His lungs have done well.   CURRENT MEDICATIONS:  1. Metoprolol 25 mg b.i.d.  2. Niaspan 500 mg nightly.  3. Simvastatin 40 mg nightly.  4. Plavix 75 mg daily.  5. Vitamin E 400 mg daily.  6. Lisinopril 20 mg daily.  7. Fish oil 1000 mg daily.  8. Oxycodone and acetaminophen 5/325 q.4 h. pain.  9. Septra.   PHYSICAL EXAMINATION:  GENERAL:  Pleasant, 76 year old man who looked  younger than his stated age.  He is a good historian.  His affect was  normal.  Speech normal.  Sentence structure intact.  VITAL SIGNS:  Temperature is 97.7, blood pressure 103/53, pulse 66,  respiratory rate 20, and O2 sat is 98% on room air.  HEENT:  Mucous membranes appeared to be moist.  SKIN:  Turgor was normal.  HEART:  Regular rhythm, rate of about 80.  LUNGS:  Appeared clear throughout.  He is moving air well.  ABDOMEN:  Soft and obese without definite organomegaly or mass.  He was   tender on palpation on the right upper quadrant.  Bowel sounds are  normal.  EXTREMITIES:  He had 2+ dorsalis pedis pulses bilaterally.  No edema of  the ankles and feet.   His white cell count today is 10,100 with 80% neutrophils, 13 lymphs.  His hemoglobin was 15.5, platelet count of 156,000.  CMP showed a sodium  127, potassium 5.0, chloride 93, glucose 298, BUN 29, and creatinine  1.81.  UA was essentially negative except for glucose in the urine and  trace ketones.   ADMISSION DIAGNOSES:  1. Cholelithiasis, possibly mild cholecystitis.  2. Type 2 diabetes.  3. Morbid obesity (currently he is 5 feet 4 inches and weighs about      225 pounds).  4. Coronary artery disease status post myocardial infarction in 1998      with a stent in 1998 and stent in 2004 and decreased ejection      fraction of 30% at that time.  5. Benign essential hypertension.  6. Hyperlipidemia.  7. Electrolyte abnormalities.   I discussed all with the patient, his wife, and family members.  He will  be admitted to the hospital, on IV fluids, started him on Rocephin a  gram IV q.24 h. and will be seen by his LMD, Dr. [name], tomorrow and  at that time his family can discuss a Careers adviser.  We have already had  partial discussion of this.  His renal function is slightly off but that  may be due to dehydration.  We would like to get a CT scan with  contrast, if his renal function improves tomorrow, also I think  hepatobiliary study.  Meanwhile, we will recheck his glucoses in a.c.  and h.s. and put him on sensitive sliding scale insulin.  I have added  A1c and a C peptide to his blood tests.  Further, we will have the  dietician to see him concerning his diet.  He is a devotee of sausage  biscuits, cream potatoes and such and we will need to make significant  changes in his diet in order to control his diabetes.  All this was  discussed at length. The time of discussion with the PA working on the  case,  nursing, family, ER physician, and reviewing electronic record was  1 hour.  Mila Homer. Sudie Bailey, M.D.  Electronically Signed    SDK/MEDQ  D:  08/14/2008  T:  08/15/2008  Job:  [id number]

## 2010-08-21 NOTE — Op Note (Signed)
[name], [name]                  ACCOUNT NO.:  [id number]   MEDICAL RECORD NO.:  [id number]          PATIENT TYPE:  OIB   LOCATION:  A313                          FACILITY:  [facility]   PHYSICIAN:  Barbaraann Barthel, M.D. DATE OF BIRTH:  1934/05/24   DATE OF PROCEDURE:  08/31/2008  DATE OF DISCHARGE:                               OPERATIVE REPORT   SURGEON:  Barbaraann Barthel, MD   PREOPERATIVE DIAGNOSIS:  Cholecystitis secondary to biliary dyskinesia.   POSTOPERATIVE DIAGNOSIS:  Cholecystitis secondary to biliary dyskinesia.   PROCEDURE:  Laparoscopic cholecystectomy.   SPECIMEN:  Gallbladder.   WOUND CLASSIFICATION:  Clean contaminated.   Note, this is a 76 year old white male who had recurrent episodes of  right upper quadrant postprandial pain for approximately a year.  He was  recently hospitalized for right upper quadrant pain.  He was found on  sonogram to have some minimal thickening of the gallbladder and fatty  infiltration of the gallbladder; however, on hepatobiliary scan he had a  nonvisualization of his gallbladder which constituted significant change  when he previously had an ejection fraction of 80%.  As his symptoms  were consistent with gallbladder disease, we discussed cholecystectomy  with him.  He needed to be seen preoperatively by the [facility] as he had a previous history of myocardial infarction in 1998 and  had a stent placed at that time as well as a stent placed in 2004.  He  was cleared for surgery.  His preoperative blood sugar as he was a type  2 diabetic was acceptable, and we had discussed complications with him  preoperatively discussing complications not limited to but including  bleeding, infection, damage to bile ducts, perforation of organs, and  transitory diarrhea.  Informed consent was obtained.   GROSS OPERATIVE FINDINGS:  The patient had a great deal of fatty  infiltration of the liver, a small cystic duct which was not  cannulated.  Otherwise, the right upper quadrant appeared to be within normal limits.   TECHNIQUE:  The patient was placed in supine position.  After the  adequate administration of general anesthesia via endotracheal  intubation, a Foley catheter was aseptically inserted.  After prepping  with Betadine solution and draping in the usual manner, a periumbilical  incision was carried out over the superior aspect of the umbilicus  through, and the fascia was grasped with a sharp towel clip and  elevated, and a Veress needle was inserted and confirmed in position  with a saline drop test.  We then placed an 11-mm cannula in the  umbilicus area.  Then, under direct vision placed an 11-mm cannula in  the epigastrium and two 5-mm cannulas in the right upper quadrant  laterally.  The gallbladder was grasped.  Its adhesions were taken down.  The cystic duct and the cystic artery were clearly visualized, triply  silver clipped, and divided.  The gallbladder was removed without  spillage using the hook cautery device from the liver bed and then  removed from the abdomen using the Endosac device.  We then checked for  hemostasis.  We irrigated, and I elected to leave some Surgicel on the  liver bed and a Jackson-Pratt drain in as well which exited through one  of the 5-mm cannula sites laterally.  The abdomen was then desufflated,  and we closed the fascia in the area of the epigastrium and the  umbilicus where the 11-mm cannulas were placed with 0 Polysorb suture,  and then I used 0.5% Sensorcaine to help with postoperative comfort.  The wounds were then closed with a stapling device.  Prior to closure,  all sponge, needle, instrument counts were found to be correct.  The  drain was sutured in place with 3-0 nylon.  The patient received  approximately a liter of crystalloids intraoperatively.  There were no  complications.  We will follow him perioperatively.      Barbaraann Barthel, M.D.   Electronically Signed     WB/MEDQ  D:  08/31/2008  T:  09/01/2008  Job:  [id number]   cc:   [name], MD  Fax: [phone]    Cardiology Group.

## 2010-08-21 NOTE — H&P (Signed)
[name], [name]                  ACCOUNT NO.:  [id number]   MEDICAL RECORD NO.:  [id number]          PATIENT TYPE:  INP   LOCATION:  A206                          FACILITY:  [facility]   PHYSICIAN:  [name], MD   DATE OF BIRTH:  1935-02-19   DATE OF ADMISSION:  11/06/2006  DATE OF DISCHARGE:  LH                              HISTORY & PHYSICAL   HISTORY OF PRESENT ILLNESS:  A 76 year old white male was seen in the  office with a history of nausea, vomiting and epigastric discomfort of  approximately 3 days.  The patient states he has been unable to eat or  drink any liquids.  Has had no bowel movement.  Has episodes of  eructation, and constant nausea in spite of Phenergan.  The patient was  examined in the office and found to have some fullness in the upper  abdomen, particularly right upper quadrant.  He did have a recent  illness approximately one week ago with cough and upper respiratory  congestion.  These symptoms have improved.  The patient does have past  history of coronary artery disease with stents, dyslipidemia and  thrombolic syndrome.  Recent chest x-ray showed no evidence of pneumonia  but mild peribronchial thickening.  The patient did have ultrasound of  gallbladder in 2005 which was essentially negative.  Fatty change noted  in the liver and pancreas at that particular time.  The patient is  admitted for intravenous fluids and further evaluation of upper  abdominal pain and discomfort.   SOCIAL HISTORY:  The patient was a former cigarette smoker.  Does not  smoke now and does not use drugs or alcohol.   FAMILY HISTORY:  See previous record.   PAST MEDICAL AND SURGICAL HISTORY:  1. Left knee surgery in 1978.  2. Vasectomy in 1968.  3. History of coronary artery disease with myocardial infarction 1998.  4. Has two-vessel disease.   ALLERGIES:  No known allergies.   MEDICATION LIST:  1. Vytorin 10/21 daily.  2. Niaspan 500 mg daily.  3. Aspirin 81 mg  daily.  4. Isosorbide 30 mg daily.  5. Verapamil SA 120 mg daily.  6. Plavix 75 mg daily.   ALLERGIES:  Patient has no drug allergies.   REVIEW OF SYSTEMS:  HEENT: Negative.  CARDIOPULMONARY:  The patient has  slight cough.  No chest pain.  GI: Nausea and vomiting over a period of  several days and retching.  Also, constipation for 2-3 days, discomfort  in the upper abdomen.  GU: No dysuria, hematuria.   PHYSICAL EXAMINATION:  GENERAL:  Uncomfortable white male.  VITAL SIGNS:  Blood pressure 110/80, pulse 60, respirations 18,  temperature 98.  HEENT: Eyes: PERRLA.  TMs negative.  Oropharynx benign.  NECK:  Supple.  No JVD or thyroid abnormalities.  HEART:  Regular rhythm.  No murmurs.  No cardiomegaly.  LUNGS:  Clear to P&A.  ABDOMEN:  Distended, fullness in right upper quadrant.  No organomegaly  with exception of possible liver enlargement and minimal tenderness.  EXTREMITIES:  Free of edema.  GENITALIA:  Normal.   DIAGNOSES:  Epigastric pain of undetermined etiology, possible  underlying gallbladder disease or liver disease.  The patient does have  coronary artery disease with previous MI.   PLAN:  Obtain CT scan of abdomen today, start intravenous fluids, IV  Zofran for control of nausea.  Will obtain GI consult.  Continue to  monitor cardiac markers.      [name], MD  Electronically Signed     AGM/MEDQ  D:  11/06/2006  T:  11/07/2006  Job:  [id number]

## 2010-08-21 NOTE — Discharge Summary (Signed)
[name], [name]                  ACCOUNT NO.:  [id number]   MEDICAL RECORD NO.:  [id number]          PATIENT TYPE:  INP   LOCATION:  A206                          FACILITY:  [facility]   PHYSICIAN:  [name], MD   DATE OF BIRTH:  11/27/1934   DATE OF ADMISSION:  11/06/2006  DATE OF DISCHARGE:  08/02/2008LH                               DISCHARGE SUMMARY   DIAGNOSES:  1. Gastritis.  2. Gastroduodenitis.  3. History of hyperlipidemia.  4. History of previous coronary artery disease.  5. Prior coronary angioplasty.   CONDITION:  Stable and improved at the time of his discharge.   This 76 year old white male was seen in the office for nausea, vomiting  and epigastric discomfort for approximately 3 days.  The patient states  he is unable to eat or drink any liquids, he has had no bowel movement.  He has had episodes of eructation and constant nausea in spite of having  Phenergan.  The patient was examined in the office and found to have  some fullness in upper abdomen, particularly the right upper quadrant,  did have recent illness approximately 1 week prior to this admission  with cough and upper respiratory congestion.  These symptoms have  improved.  The patient does have a history of coronary artery disease  with previous stent placement, dyslipidemia, a recent chest x-ray showed  no evidence of pneumonia but mild peribronchial thickening.  The patient  did have ultrasound of the gallbladder in 2005, which was essentially  negative.  Fatty changes were noted in the liver.  The patient was  admitted for intravenous fluids and further evaluation of abdominal pain  and discomfort.   PHYSICAL EXAMINATION:  GENERAL APPEARANCE:  Uncomfortable white male  with constant nausea.  VITAL SIGNS:  Blood pressure 110/80, pulse 60, respiration 18,  temperature 98.  HEENT:  Eyes: PERRLA.  TMs negative.  Oropharynx benign.  NECK:  Supple.  No JVD or thyroid abnormalities.  HEART:  Regular  rhythm, no murmurs.  No cardiomegaly.  LUNGS:  Clear to P&A.  ABDOMEN:  Distended with fullness in right upper quadrant.  No  organomegaly with the exception of possible liver enlargement and  minimal tenderness.  EXTREMITIES:  Free of edema.   LABORATORY DATA:  Admission CBC with wbc 12,500, hemoglobin 16.5,  hematocrit 49.3, 77% neutrophils, 9.6 absolute neutrophils.  Chemistries  with sodium 132, potassium 4.7, chloride 94, CO2 0.29, glucose 197, BUN  20, creatinine 0.97.  GFR greater than 60.  Liver enzymes with SGOT 50,  SGPT 47, alkaline phosphatase 61, bilirubin 0.8.  On November 07, 2006,  SGOT 53, SGPT 47, alkaline phosphatase 54, bilirubin 0.8.  Cardiac  markers with CK 88, CK-MB 2.6, troponin 0.02.  Subsequent markers of CK  58, CK-MB 2.7, a troponin of 0.02.  PSA 1.16.   X-RAYS:  Abdominal CT with no acute finding, fatty liver, small hiatal  hernia, coronary and aortic atherosclerotic vascular disease. Abdominal  ultrasound with no acute findings.   HOSPITAL COURSE:  The patient, at time of his admission, was placed on  half-normal saline 125 mL/hour.  Vital signs were monitored q.i.d.  [facility] standing orders.  Cardiac markers x3.  He was started on IV  Zofran 4 mg every 6 hours p.r.n. for nausea.  His home meds were  continued, Vytorin 10/20 one daily, Niaspan 500 mg h.s., aspirin 81 mg  daily, isosorbide 30 mg daily, verapamil SA 120 mg daily and Plavix 75  mg daily, IV Protonix was started 40 mg every 12 hours.  The patient,  during his hospital stay, improved.  His nausea persisted initially.  The patient was seen by GI service and they ordered a gallbladder  ultrasound which was essentially negative.  A HIDA scan was discussed  and scheduled as an outpatient.  His diet was advanced to a soft diet  and the patient improved with his hospitalization.  The patient remained  in the hospital 2 days and showed gradual improvement.   DISCHARGE MEDICATIONS:  He was discharged  on the following medications  1. Plavix 75 mg daily.  2. Aspirin 81 mg was held.  3. Verapamil SA 120 mg daily.  4. Isosorbide 30 mg daily.  5. Vytorin 10/20 one daily.  6. Niaspan 500 mg one daily.  7. Metoprolol 25 mg daily.  8. Altace 5 mg b.i.d.  9. Protonix 40 mg b.i.d.  10.Phenergan 25 mg every 4 hours p.r.n. for nausea.   The patient was instructed to return to physician's office for follow-  up.      [name], MD  Electronically Signed     AGM/MEDQ  D:  11/25/2006  T:  11/25/2006  Job:  [id number]

## 2010-08-21 NOTE — Assessment & Plan Note (Signed)
[facility]                       [name] CARDIOLOGY OFFICE NOTE   [name], [name]                         MRN:          [id number]  DATE:08/22/2008                            DOB:          October 11, 1934    CARDIOLOGIST:  [name], M.D., [facility].   PRIMARY CARE PHYSICIAN:  [name], M.D.   REASON FOR VISIT:  Preop clearance.   HISTORY OF PRESENT ILLNESS:  [name] is a 76 year old male patient  with a history of coronary artery disease status post large anterior  wall myocardial infarction in April 1998 treated with angioplasty and  stenting of the LAD and angioplasty of the circumflex, who was  previously followed by [name] of the [facility].  He had an abnormal Myoview study in October 2004 that demonstrated  inferolateral ischemia and an EF of 35%.  Diagnostic catheterization  demonstrated an EF of 46% and patent stent in the LAD, new 90-95% mid  LAD, total occlusion of the mid third diagonal, patent circumflex, and a  60-70% stenosis in the proximal to mid RCA.  At his intervention, his 35-  95% mid LAD stenosis had improved to 30-40% stenosis and no intervention  was required.  However, the patient did undergo drug-eluting stent  placement with a Cypher stent to the RCA.  He apparently had Myoview  study with [name] shortly before transferring to [name]  service of [facility].  I believe this was done sometime in  2007, and it demonstrated a large area of scar anteroseptally and  inferiorly extending from the base towards the apex, with minimal peri-  infarction ischemia, primarily in the anterior wall.  His EF on his  nuclear study was 29%.  I do not see any evidence of an echocardiogram  performed in the recent past.  He has recently been diagnosed with  significant cholelithiasis and what appears to be cholestasis by recent  HIDA scan.  He is in need of an elective  cholecystectomy.  In the office  today, he does continue to note right upper quadrant pain.  However, he  denies any chest discomfort or significant shortness of breath.  He  denies any symptoms reminiscent of his previous angina.  He denies any  orthopnea, PND or pedal edema.  He denies syncope.   CURRENT MEDICATIONS:  1. Simvastatin 40 mg daily.  2. Niaspan 500 mg bedtime.  3. Metoprolol 25 mg b.i.d.  4. Plavix 75 mg daily, this has been on hold for the last several days      due to his upcoming surgery.  5. Lisinopril 20 mg daily.  6. Vitamin E.  7. Fish oil 1 g daily.  8. VESIcare 5 mg daily.  9. Metformin 500 mg b.i.d.  10.Tylenol p.r.n.   ALLERGIES:  NOVOCAIN causes nausea and vomiting.   SOCIAL HISTORY:  He is an ex-smoker.  He quit in 1990s after a 40 pack-  year history of smoking.   REVIEW OF SYSTEMS:  Please see HPI.  Denies fevers, cough, melena,  hematochezia.  All other systems  reviewed and negative.   PHYSICAL EXAMINATION:  He is a well-nourished, well-developed male in no  acute stress.  Blood pressure is 88/60, pulse 67, weight 209 pounds.  HEENT:  Normal.  NECK:  Without JVD.  CARDIAC:  Normal S1-S2, regular rate and rhythm.  LUNGS:  Clear to auscultation bilaterally.  ABDOMEN:  Soft, nontender.  EXTREMITIES:  Without edema.  NEUROLOGIC:  He is alert and oriented x3.  Cranial nerves II-XII are  grossly intact.  SKIN:  Warm and dry.   Electrocardiogram reveals sinus rhythm with a heart rate of 67, normal  axis, poor R-wave progression, frequent PVCs, nonspecific ST-T wave  changes.  No significant change since previous tracing dated May 22, 2007.   ASSESSMENT AND PLAN:  1. Cholelithiasis.  The patient needs cholecystectomy.  I discussed      the case further with [name] today.  According to ACC/AHA      guidelines, he requires no further cardiac workup prior to his      noncardiac surgery.  Given his multiple comorbidities and known       history of coronary disease, he is certainly at increased but      acceptable risk.  Our service will certainly be available in the      perioperative period as necessary.  His beta blocker should be      continued.  He should continue on aspirin therapy as well, given      his history of prior PCI and drug-eluting stent placement.  He is      more than 2 years out since his drug-eluting stent placement to the      RCA.  Therefore, it should be safe for him to be off of Plavix for      a short period of time for his upcoming surgery.  This should be      restarted postoperatively when felt to be safe.  The patient has      been advised to go ahead and restart his aspirin 81 mg a day.  We      will update [name] on this.  He will have an echocardiogram      performed tomorrow to reassess his LV function so that we can have      this information in the postoperative period as necessary.  Careful      attention should be given to avoid excessive fluid resuscitation to      avoid acute systolic congestive heart failure.  Our service will      certainly be available in the perioperative period as necessary.  2. Coronary disease status post prior stenting to the left anterior      descending and right coronary artery.  As noted above, he had a      drug-eluting stent placed to the RCA in 2004.  He has been asked to      restart his aspirin while he is off his Plavix.  As noted above,      this should be continued throughout the perioperative period.  3. Ischemic cardiomyopathy.  As noted above, we are going to assess      this further with an echocardiogram to obtain his current LV      function.  When we see him back postoperatively, if his EF remains      below 35%, we can certainly consider adjusting his metoprolol over      to carvedilol and possibly referring him to  electrophysiology for      consideration of prophylactic ICD implantation.  4. Dyslipidemia.  This is followed by Dr.  [name].  His last set of      lipids obtained February 2009 demonstrated optimal LDL of 60.  5. Hypotension.  This patient is asymptomatic.  However, in the      preoperative setting, we will adjust his lisinopril to 10 mg a day      to allow his blood pressure some room to move up prior to surgery.   DISPOSITION:  The patient will be brought back in followup with either  me or Dr. Dietrich Pates 2 weeks postoperatively.  Our service will certainly  be available in the perioperative period as necessary.      Tereso Newcomer, PA-C  Electronically Signed      Jesse Sans. Daleen Squibb, MD, [facility]  Electronically Signed   SW/MedQ  DD: 08/22/2008  DT: 08/22/2008  Job #: [id number]   cc:   [name], MD  Barbaraann Barthel, M.D.

## 2010-08-24 NOTE — Cardiovascular Report (Signed)
NAME:  [name], [name]                            ACCOUNT NO.:  [id number]   MEDICAL RECORD NO.:  [id number]                   PATIENT TYPE:  OIB   LOCATION:  2866                                 FACILITY:  [facility]   PHYSICIAN:  Nicki Guadalajara, M.D.                  DATE OF BIRTH:  Apr 05, 1935   DATE OF PROCEDURE:  02/08/2003  DATE OF DISCHARGE:                              CARDIAC CATHETERIZATION   INDICATIONS:  [name] is a 76 year old gentleman who suffered a large  anterior wall myocardial infarction in April 1998.  At that time, he underwent percutaneous transluminal coronary angioplasty of  his left anterior descending artery.  In April 1998, he also underwent  stenting of his LAD and percutaneous transluminal coronary angioplasty of  his circumflex marginal vessel.  He has a history of obesity, hyperlipidemia  and recently has noticed some episodes of vague chest discomfort.  A  Cardiolite study was done which showed anteroseptal and inferior septal wall  scar extending from base to apex.  However, there now is a new area of  inferolateral and mild lateral ischemia noted at the base towards the mid  ventricular level.  Ejection fraction was 35%.  Because of new ischemia, he  underwent repeat catheterization which was done on February 03, 2003 at the  [facility].  This showed mild LV dysfunction with an EF of 46%  with hypocontractility involving the anterior wall, mid posterior lateral  wall and inferior segment.  He was noted to have two-vessel coronary artery  disease with mild 10-20% distal tapering of his left main.  There was a  widely patent stent in a proximal left anterior descending artery between  the first and second diagonal vessel.  There was smooth intimal hyperplasia  of less than 20% within the segment.  He now had a new 90-95% somewhat  diffuse focal stenosis in the mid LAD between the second and third diagonal  vessel.  The third diagonal vessel  was very small and had a total mid  diagonal occlusion.  His circumflex vessel had no evidence of restenosis at  the site of prior intervention.  The right coronary artery had irregularity  in its proximal to mid segment, but there was a 60-70% stenosis focally just  prior to the crux with 30% scattered irregularity in the distal RCA.  The  patient was started on Plavix therapy as well as increased nitrates.  He is  now referred for coronary intervention.   DESCRIPTION OF PROCEDURE:  After premedication with Valium 5 mg  intravenously, the patient was prepped and draped in the usual fashion.  His  right femoral artery was punctured anteriorly and a 7 French sheath was  inserted.  Attention was initially directed at probable intervention  involving the left anterior descending artery.  For this reason, a 7 [place]  FL-4 guide was inserted.  Several views of the left system, however, did not  show the previously noted 90-95% mid LAD stenosis and this had significantly  improved to perhaps 30-40% and was smooth in contour.  For this reason, it  was not felt that intervention of the LAD vessel was necessary.  Attention  was therefore directed at the right coronary artery.  An FR-4 guide was  used.  A scout angiography again confirmed the 70% stenosis in the region  just proximal to the crux in this patient with new documented ischemia  inferolaterally most likely due to this RCA lesion.  There was no change in  the previously noted 30% mid RCA irregularity as well as scattered 30%  distal RCA irregularities.  The patient had previously consisted to undergo  the STEEPLE trial.  He was enrolled and randomized to receive heparin arm.  Glycoprotein 2b3a inhibition was done with double bolus Integrelin.  A  Patriot wire was advanced down the right coronary artery.  A 3.5 x 81-mm  drug-eluting Cypher stent was then successfully deployed up to 17  atmospheres in this 70% stenotic region.  Post stent  dilatation was done  with a 3.75 x 12-mm Maverick balloon within the stented segment.  Scout  angiography confirmed an excellent angiographic result with 70% diameter  stenosis being reduced to 0%.  ACT at the completion of the study was  therapeutic at 278.  The arterial sheath was sutured in place with plans for  sheath removal later today.   HEMODYNAMIC DATA:  Central aortic pressure was 105/63, mean 80.  During the  procedure, the patient received several doses of intracoronary  nitroglycerin.   At the start of the intervention, the right coronary was a large caliber  vessel that had proximal luminal irregularities.  There was 30% luminal  irregularity in the mid segment followed by 70% stenosis just proximal to  the crux with additional 20-30% narrowings beyond the crux prior to the PDA  take off.  Following primary stenting with post stent dilatation, the 70%  stenosis was reduced to 0%.  There was no evidence for dissection.  There  was TIMI-3 flow.   IMPRESSION:  Successful percutaneous coronary intervention with primary  stenting of the right coronary artery with a 70% stenosis being reduced to  0% utilizing a 3.5 x 18-mm drug-eluting Cypher post dilated utilizing a 3.75  mm Quantum balloon with the patient enrolled in the STEEPLE trial  randomized to the heparin arm and adjunctive 2b3a inhibition with  Integrelin.                                               Nicki Guadalajara, M.D.    TK/MEDQ  D:  02/08/2003  T:  02/08/2003  Job:  [id number]   cc:   [name], M.D.  [address]  [place]  [place] [address]  Fax: [phone]   [facility]

## 2010-08-24 NOTE — Discharge Summary (Signed)
NAMEBREVON, DEWALD                  ACCOUNT NO.:  [id number]   MEDICAL RECORD NO.:  [id number]          PATIENT TYPE:  INP   LOCATION:  IC02                          FACILITY:  [facility]   PHYSICIAN:  [name], MD   DATE OF BIRTH:  [birth year]   DATE OF ADMISSION:  05/26/2008  DATE OF DISCHARGE:  02/19/2010LH                               DISCHARGE SUMMARY   1 day hospitalization.   DIAGNOSES:  1. Bronchitis, asthmatic type.  2. Left base atelectasis.  3. Chronic obstructive pulmonary disease.  4. Subconjunctival hemorrhage.   CONDITION:  Stable and improved at the time of his discharge.   This 76 year old white male was brought to the hospital with pain in his  left eye, left ear, and upper respiratory congestion.  He was seen in  the emergency room and evaluated.  Because of his breathing problems, it  was felt he should be admitted to the hospital.  X-rays of his chest  showed chronic changes without acute pulmonary disease and borderline  heart size.  CT of the head showed no acute intracranial abnormality,  mild atrophy, and chronic ischemic white matter disease, chronic right  mastoiditis.   PHYSICAL EXAMINATION:  VITAL SIGNS:  Blood pressure 101/58, respirations  18, pulse 71, temp 98.  HEENT:  Eyes PERRLA.  The patient has subconjunctival hemorrhage in left  eye.  NECK:  Supple.  No JVD or thyroid abnormalities.  HEART:  Regular rhythm.  No murmurs.  LUNGS:  Rhonchi and wheezes in both lower lung fields.  ABDOMEN:  No palpable organs or masses.   Dictation Ended At [employer].      [name], MD  Electronically Signed     AGM/MEDQ  D:  06/14/2008  T:  06/14/2008  Job:  [id number]

## 2010-08-24 NOTE — Op Note (Signed)
[name], [name]                            ACCOUNT NO.:  [id number]   MEDICAL RECORD NO.:  [id number]                   PATIENT TYPE:  AMB   LOCATION:  DAY                                  FACILITY:  [facility]   PHYSICIAN:  R. Roetta Sessions, M.D.              DATE OF BIRTH:  Jul 26, 1934   DATE OF PROCEDURE:  09/13/2003  DATE OF DISCHARGE:                                 OPERATIVE REPORT   PROCEDURE:  Colonoscopy with snare polypectomy.   INDICATIONS FOR PROCEDURE:  The patient is a 76 year old gentleman sent over  by the courtesy of Dr. Butch Penny for colorectal cancer screening.  He is  devoid of any lower GI tract symptoms.  He has never had a colonoscopy.  There is no family history of colorectal neoplasia.  Colonoscopy is now  being done as a standard screening maneuver.  This approach has been  discussed with the patient at length.  The potential risks, benefits, and  alternatives have been reviewed and questions answered.   PROCEDURE:  O2 saturation, blood pressure, pulses, and respirations were  monitored throughout the entirety of the procedure.  Conscious sedation was  with Versed 3 mg IV, Demerol 50 mg IV in divided doses.  The instrument used  was the Olympus video chip system.   FINDINGS:  Digital rectal examination revealed no abnormalities.   ENDOSCOPIC FINDINGS:  The prep was adequate.   Rectum:  Examination of the rectal mucosa including retroflex view of the  anal verge revealed no abnormalities.   Colon:  The colonic mucosa was surveyed from the rectosigmoid junction  through the left, transverse, right colon to the area of the appendiceal  orifice, ileocecal valve, and cecum.  These structures were well-seen and  photographed for the record.  From this level, the scope was slowly  withdrawn.  All previously mentioned mucosal surfaces were again seen.  The  patient was noted to have pancolonic diverticula.  The patient had a 7-mm  polyp on a stalk at the  mid-ascending colon which was cold snared.  The  patient had a second 5-mm polyp at the splenic flexure and a third polyp  measuring 5-mm in the mid-descending colon, all of which were cold snared  and recovered.  The remainder of the colonic mucosa appeared normal.  The  patient tolerated the procedure well and was reactive in endoscopy.   IMPRESSION:  1. Normal rectum.  2. Pancolonic diverticula.  3. Polyps throughout the colon, as outlined above, removed with the snare.   RECOMMENDATIONS:  1. No aspirin for the next five days.  He may continue Plavix.  2. Follow up on pathology.  3. Diverticulosis literature provided to [name].  4. Further recommendations to follow.      ___________________________________________  Jonathon Bellows, M.D.   RMR/MEDQ  D:  09/13/2003  T:  09/14/2003  Job:  [id number]   cc:   Nicki Guadalajara, M.D.  [phone] [address]  [place], [place] [address]  Fax: [phone]   [name], M.D.  [address]  [place]  [place] [address]  Fax: [phone]

## 2010-08-24 NOTE — Discharge Summary (Signed)
   [name], [name]                            ACCOUNT NO.:  [id number]   MEDICAL RECORD NO.:  [id number]                   PATIENT TYPE:  OIB   LOCATION:  6533                                 FACILITY:  [facility]   PHYSICIAN:  Nicki Guadalajara, M.D.                  DATE OF BIRTH:  1935/03/08   DATE OF ADMISSION:  02/08/2003  DATE OF DISCHARGE:  02/09/2003                                 DISCHARGE SUMMARY   DISCHARGE DIAGNOSES:  1. Abnormal Cardiolite study, catheterization this admission revealing a 70%     right coronary artery treated with CYPHER stent.  2. History of coronary disease, anterior wall myocardial infarction in 1998     treated with left anterior descending stenting.  3. Left-ventricular dysfunction, ejection fraction 30% by stress test in May     of 2000.  4. Hypertension.  5. Hyperlipidemia.  6. Obesity.   HOSPITAL COURSE:  The patient was admitted for elective intervention after  having heart catheterization February 03, 2003, at [facility].  This revealed 80% distal RCA, 10-20% left main narrowing, and patent LAD  stent site, but a 95% mid LAD.  EF at the time of his catheterization was  41%.  He was admitted for elective LAD and RCA intervention, but the LAD was  now 30-40% on February 08, 2003.  The RCA was dilated and stented.  The plan  is for continued medical therapy.  He was discharged February 09, 2003,   DISCHARGE MEDICATIONS:  1. Imdur 60 mg a day.  2. Coated aspirin once a day.  3. Plavix 75 mg once a day.  4. Lipitor 10 mg a day.  5. Niacin 500 mg h.s.  6. Altace 5 mg a day.  7. Verapamil 120 mg a day.  8. Toprol XL 25 mg a day.  9. Nitroglycerin sublingual p.r.n.   LABORATORY DATA:  White count 9.4, hemoglobin 14.9, hematocrit 43.2,  platelets 204.  Sodium 139, potassium 4.5, BUN 12, creatinine 1.0, INR 1.1.  CK-MB and troponin negative x1.   EKG showed sinus rhythm, poor anterior R wave progression.   DISPOSITION:  The patient is  discharged in stable condition.    FOLLOWUP:  He will follow up with the office PA on November 17, at 9:15 a.m.  and then [name] in about one month.      Abelino Derrick, P.A.                      Nicki Guadalajara, M.D.    Lenard Lance  D:  02/09/2003  T:  02/10/2003  Job:  [id number]

## 2010-08-24 NOTE — Op Note (Signed)
NAMEMELBURN, TREIBER                            ACCOUNT NO.:  [id number]   MEDICAL RECORD NO.:  [id number]                   PATIENT TYPE:  AMB   LOCATION:  DAY                                  FACILITY:  [facility]   PHYSICIAN:  R. Roetta Sessions, M.D.              DATE OF BIRTH:  1934/10/03   DATE OF PROCEDURE:  09/13/2003  DATE OF DISCHARGE:                                 OPERATIVE REPORT   PROCEDURE:  Colonoscopy with snare polypectomy.   INDICATIONS:  The patient is a 76 year old gentleman sent by Dr. [name] for  colorectal cancer screening.  He has never had any bowel symptoms and no  family history of colorectal neoplasia.  Never has had colon imaging  previously.  Colonoscopy is now being done as a standard screening maneuver.  This procedure has been discussed with the patient at length at the bedside.  The potential risks, benefits and alternatives have been reviewed.  Please  see the documentation in the medical record for more information.   DESCRIPTION OF PROCEDURE:  Oxygen saturation, blood pressure, pulse and  respiration were monitored   Dictation cut off here.      ___________________________________________                                            Jonathon Bellows, M.D.   RMR/MEDQ  D:  09/13/2003  T:  09/14/2003  Job:  [id number]

## 2010-08-24 NOTE — Discharge Summary (Signed)
NAMEKORI, COLIN                  ACCOUNT NO.:  [id number]   MEDICAL RECORD NO.:  [id number]          PATIENT TYPE:  INP   LOCATION:  IC02                          FACILITY:  [facility]   PHYSICIAN:  [name], MD   DATE OF BIRTH:  02/14/1935   DATE OF ADMISSION:  05/26/2008  DATE OF DISCHARGE:  02/19/2010LH                               DISCHARGE SUMMARY   ADDENDUM   HOSPITAL COURSE:  This patient on admission was placed on a heart-  healthy diet, IV fluids at 100 mL/hour, vital signs q.i.d.  He was  placed on:  1. IV Rocephin 1 g daily.  2. Zithromax 250 mg daily.  3. Albuterol/Atrovent neb treatments every 4 hours while awake.  4. Robitussin teaspoon q.4 h. p.r.n. for cough.  5. Continued on metoprolol 25 mg b.i.d.  6. Niaspan 500 mg at bedtime.  7. Simvastatin 40 mg bedtime.  8. Lisinopril 20 mg daily.   This patient rapidly improved to his hospital stay.  He was able to be  discharged after 1 day of hospitalization.  He was discharged to home  on:  1. Metoprolol 25 mg b.i.d.  2. Niaspan 500 mg daily.  3. Simvastatin 40 mg daily.  4. Plavix 75 mg daily.  5. Vitamin D 400 mg daily.  6. Lisinopril 20 mg daily.  7. Fish oil 1000 mg daily.  8. Robitussin teaspoon every 4 hours p.r.n. for cough.  9. Levaquin 500 mg daily.      [name], MD  Electronically Signed     AGM/MEDQ  D:  06/23/2008  T:  06/23/2008  Job:  [id number]

## 2010-10-05 ENCOUNTER — Emergency Department (HOSPITAL_COMMUNITY)
Admission: EM | Admit: 2010-10-05 | Discharge: 2010-10-06 | Disposition: A | Discharge status: Home / Self Care | Payer: Medicare Other | Attending: Emergency Medicine | Admitting: Emergency Medicine

## 2010-10-05 ENCOUNTER — Emergency Department (HOSPITAL_COMMUNITY): Payer: Medicare Other

## 2010-10-05 DIAGNOSIS — Y93H2 Activity, gardening and landscaping: Secondary | ICD-10-CM | POA: Insufficient documentation

## 2010-10-05 DIAGNOSIS — Z9861 Coronary angioplasty status: Secondary | ICD-10-CM | POA: Insufficient documentation

## 2010-10-05 DIAGNOSIS — Z79899 Other long term (current) drug therapy: Secondary | ICD-10-CM | POA: Insufficient documentation

## 2010-10-05 DIAGNOSIS — E785 Hyperlipidemia, unspecified: Secondary | ICD-10-CM | POA: Insufficient documentation

## 2010-10-05 DIAGNOSIS — R110 Nausea: Secondary | ICD-10-CM | POA: Insufficient documentation

## 2010-10-05 DIAGNOSIS — R070 Pain in throat: Secondary | ICD-10-CM | POA: Insufficient documentation

## 2010-10-05 DIAGNOSIS — R5383 Other fatigue: Secondary | ICD-10-CM | POA: Insufficient documentation

## 2010-10-05 DIAGNOSIS — R5381 Other malaise: Secondary | ICD-10-CM | POA: Insufficient documentation

## 2010-10-05 DIAGNOSIS — X30XXXA Exposure to excessive natural heat, initial encounter: Secondary | ICD-10-CM | POA: Insufficient documentation

## 2010-10-05 DIAGNOSIS — I2510 Atherosclerotic heart disease of native coronary artery without angina pectoris: Secondary | ICD-10-CM | POA: Insufficient documentation

## 2010-10-05 DIAGNOSIS — T675XXA Heat exhaustion, unspecified, initial encounter: Secondary | ICD-10-CM | POA: Insufficient documentation

## 2010-10-05 DIAGNOSIS — Z7982 Long term (current) use of aspirin: Secondary | ICD-10-CM | POA: Insufficient documentation

## 2010-10-06 LAB — DIFFERENTIAL
Basophils Relative: 0 % (ref 0–1)
Eosinophils Absolute: 0.1 10*3/uL (ref 0.0–0.7)
Lymphs Abs: 2.8 10*3/uL (ref 0.7–4.0)
Monocytes Relative: 9 % (ref 3–12)
Neutro Abs: 5.8 10*3/uL (ref 1.7–7.7)
Neutrophils Relative %: 61 % (ref 43–77)

## 2010-10-06 LAB — CBC
Hemoglobin: 13.8 g/dL (ref 13.0–17.0)
MCV: 96.8 fL (ref 78.0–100.0)
Platelets: 167 10*3/uL (ref 150–400)
RBC: 4.04 MIL/uL — ABNORMAL LOW (ref 4.22–5.81)
WBC: 9.5 10*3/uL (ref 4.0–10.5)

## 2010-10-06 LAB — URINALYSIS, ROUTINE W REFLEX MICROSCOPIC
Glucose, UA: NEGATIVE mg/dL
Leukocytes, UA: NEGATIVE
pH: 5.5 (ref 5.0–8.0)

## 2010-10-06 LAB — URINE MICROSCOPIC-ADD ON

## 2010-10-06 LAB — BASIC METABOLIC PANEL
CO2: 29 mEq/L (ref 19–32)
Chloride: 100 mEq/L (ref 96–112)
Glucose, Bld: 140 mg/dL — ABNORMAL HIGH (ref 70–99)
Sodium: 138 mEq/L (ref 135–145)

## 2011-01-21 LAB — CARDIAC PANEL(CRET KIN+CKTOT+MB+TROPI)
CK, MB: 2.7
Relative Index: INVALID
Total CK: 58
Total CK: 88
Troponin I: 0.02

## 2011-01-21 LAB — COMPREHENSIVE METABOLIC PANEL
ALT: 47
BUN: 20
Calcium: 9.5
Glucose, Bld: 197 — ABNORMAL HIGH
Sodium: 132 — ABNORMAL LOW
Total Protein: 7.1

## 2011-01-21 LAB — DIFFERENTIAL
Lymphs Abs: 1.9
Monocytes Relative: 7
Neutro Abs: 9.6 — ABNORMAL HIGH
Neutrophils Relative %: 77

## 2011-01-21 LAB — CBC
Hemoglobin: 16.5
MCHC: 33.6
RDW: 13.8

## 2011-01-21 LAB — HEPATIC FUNCTION PANEL
Albumin: 3.7
Total Bilirubin: 0.8
Total Protein: 6.2

## 2011-01-21 LAB — LIPASE, BLOOD: Lipase: 25

## 2011-01-21 LAB — PSA
PSA: 1.1
PSA: 1.16

## 2011-01-21 LAB — AMYLASE: Amylase: 42

## 2011-02-15 ENCOUNTER — Other Ambulatory Visit: Payer: Self-pay

## 2011-02-15 ENCOUNTER — Inpatient Hospital Stay (HOSPITAL_COMMUNITY): Payer: Medicare Other

## 2011-02-15 ENCOUNTER — Encounter: Payer: Self-pay | Admitting: *Deleted

## 2011-02-15 ENCOUNTER — Inpatient Hospital Stay (HOSPITAL_COMMUNITY)
Admission: EM | Admit: 2011-02-15 | Discharge: 2011-02-20 | DRG: 310 | Disposition: A | Discharge status: Home / Self Care | Payer: Medicare Other | Attending: Family Medicine | Admitting: Family Medicine

## 2011-02-15 DIAGNOSIS — I4891 Unspecified atrial fibrillation: Secondary | ICD-10-CM

## 2011-02-15 DIAGNOSIS — I2510 Atherosclerotic heart disease of native coronary artery without angina pectoris: Secondary | ICD-10-CM | POA: Diagnosis present

## 2011-02-15 DIAGNOSIS — I517 Cardiomegaly: Secondary | ICD-10-CM

## 2011-02-15 DIAGNOSIS — I10 Essential (primary) hypertension: Secondary | ICD-10-CM | POA: Diagnosis present

## 2011-02-15 DIAGNOSIS — E119 Type 2 diabetes mellitus without complications: Secondary | ICD-10-CM | POA: Diagnosis present

## 2011-02-15 DIAGNOSIS — I4821 Permanent atrial fibrillation: Secondary | ICD-10-CM

## 2011-02-15 DIAGNOSIS — I252 Old myocardial infarction: Secondary | ICD-10-CM

## 2011-02-15 DIAGNOSIS — I2589 Other forms of chronic ischemic heart disease: Secondary | ICD-10-CM | POA: Diagnosis present

## 2011-02-15 DIAGNOSIS — E876 Hypokalemia: Secondary | ICD-10-CM | POA: Diagnosis not present

## 2011-02-15 DIAGNOSIS — E785 Hyperlipidemia, unspecified: Secondary | ICD-10-CM | POA: Diagnosis present

## 2011-02-15 HISTORY — DX: Permanent atrial fibrillation: I48.21

## 2011-02-15 HISTORY — DX: Chronic obstructive pulmonary disease, unspecified: J44.9

## 2011-02-15 HISTORY — DX: Unspecified osteoarthritis, unspecified site: M19.90

## 2011-02-15 LAB — BASIC METABOLIC PANEL
Calcium: 9.6 mg/dL (ref 8.4–10.5)
Creatinine, Ser: 0.85 mg/dL (ref 0.50–1.35)
GFR calc Af Amer: >90 mL/min (ref >90)

## 2011-02-15 LAB — URINALYSIS, ROUTINE W REFLEX MICROSCOPIC
Nitrite: NEGATIVE
Urobilinogen, UA: 0.2 mg/dL (ref 0.0–1.0)

## 2011-02-15 LAB — LIPID PANEL
HDL: 38 mg/dL — ABNORMAL LOW (ref >39)
LDL Cholesterol: 48 mg/dL (ref 0–99)
Total CHOL/HDL Ratio: 3.1 RATIO
VLDL: 31 mg/dL (ref 0–40)

## 2011-02-15 LAB — HEPATIC FUNCTION PANEL
AST: 20 U/L (ref 0–37)
Albumin: 3.8 g/dL (ref 3.5–5.2)
Alkaline Phosphatase: 62 U/L (ref 39–117)
Total Bilirubin: 0.9 mg/dL (ref 0.3–1.2)

## 2011-02-15 LAB — CARDIAC PANEL(CRET KIN+CKTOT+MB+TROPI)
CK, MB: 5.9 ng/mL — ABNORMAL HIGH (ref 0.3–4.0)
Troponin I: <0.3 ng/mL (ref <0.30)

## 2011-02-15 LAB — CBC
HCT: 45.9 % (ref 39.0–52.0)
MCHC: 33.1 g/dL (ref 30.0–36.0)
MCV: 98.7 fL (ref 78.0–100.0)
RDW: 13.9 % (ref 11.5–15.5)

## 2011-02-15 LAB — URINE MICROSCOPIC-ADD ON

## 2011-02-15 LAB — GLUCOSE, CAPILLARY: Glucose-Capillary: 174 mg/dL — ABNORMAL HIGH (ref 70–99)

## 2011-02-15 LAB — TROPONIN I: Troponin I: <0.3 ng/mL (ref <0.30)

## 2011-02-15 LAB — DIFFERENTIAL
Basophils Absolute: 0 10*3/uL (ref 0.0–0.1)
Basophils Relative: 0 % (ref 0–1)
Eosinophils Absolute: 0.1 10*3/uL (ref 0.0–0.7)
Eosinophils Relative: 1 % (ref 0–5)
Monocytes Absolute: 0.8 10*3/uL (ref 0.1–1.0)

## 2011-02-15 LAB — TSH: TSH: 1.509 u[IU]/mL (ref 0.350–4.500)

## 2011-02-15 MED ORDER — POTASSIUM CHLORIDE CRYS ER 20 MEQ PO TBCR
40.0000 meq | EXTENDED_RELEASE_TABLET | Freq: Once | ORAL | Status: AC
  Administered 2011-02-15: 40 meq via ORAL
  Filled 2011-02-15: qty 2

## 2011-02-15 MED ORDER — CARVEDILOL 12.5 MG PO TABS
12.5000 mg | ORAL_TABLET | Freq: Two times a day (BID) | ORAL | Status: DC
  Administered 2011-02-15 – 2011-02-16 (×2): 12.5 mg via ORAL
  Filled 2011-02-15 (×2): qty 1

## 2011-02-15 MED ORDER — TAMSULOSIN HCL 0.4 MG PO CAPS
0.4000 mg | ORAL_CAPSULE | Freq: Every day | ORAL | Status: DC
  Administered 2011-02-15 – 2011-02-20 (×6): 0.4 mg via ORAL
  Filled 2011-02-15 (×6): qty 1

## 2011-02-15 MED ORDER — SODIUM CHLORIDE 0.9 % IV BOLUS (SEPSIS)
500.0000 mL | Freq: Once | INTRAVENOUS | Status: AC
  Administered 2011-02-15: 500 mL via INTRAVENOUS

## 2011-02-15 MED ORDER — DILTIAZEM HCL 25 MG/5ML IV SOLN
25.0000 mg | Freq: Once | INTRAVENOUS | Status: AC
  Administered 2011-02-15: 25 mg via INTRAVENOUS
  Filled 2011-02-15: qty 5

## 2011-02-15 MED ORDER — FUROSEMIDE 10 MG/ML IJ SOLN
40.0000 mg | Freq: Once | INTRAMUSCULAR | Status: AC
  Administered 2011-02-15: 40 mg via INTRAVENOUS
  Filled 2011-02-15: qty 4

## 2011-02-15 MED ORDER — WARFARIN SODIUM 5 MG PO TABS
5.0000 mg | ORAL_TABLET | Freq: Once | ORAL | Status: AC
  Administered 2011-02-15: 5 mg via ORAL
  Filled 2011-02-15: qty 1

## 2011-02-15 MED ORDER — PRAVASTATIN SODIUM 40 MG PO TABS
40.0000 mg | ORAL_TABLET | Freq: Every day | ORAL | Status: DC
  Administered 2011-02-15 – 2011-02-19 (×5): 40 mg via ORAL
  Filled 2011-02-15 (×6): qty 1

## 2011-02-15 MED ORDER — ENOXAPARIN SODIUM 100 MG/ML ~~LOC~~ SOLN
1.0000 mg/kg | Freq: Two times a day (BID) | SUBCUTANEOUS | Status: DC
  Administered 2011-02-15 – 2011-02-19 (×9): 90 mg via SUBCUTANEOUS
  Filled 2011-02-15 (×10): qty 1

## 2011-02-15 MED ORDER — SODIUM CHLORIDE 0.9 % IV SOLN
INTRAVENOUS | Status: DC
  Administered 2011-02-15: 12:00:00 via INTRAVENOUS

## 2011-02-15 MED ORDER — METFORMIN HCL 500 MG PO TABS
500.0000 mg | ORAL_TABLET | Freq: Two times a day (BID) | ORAL | Status: DC
  Administered 2011-02-15 – 2011-02-20 (×11): 500 mg via ORAL
  Filled 2011-02-15 (×11): qty 1

## 2011-02-15 MED ORDER — DILTIAZEM HCL ER COATED BEADS 180 MG PO CP24
180.0000 mg | ORAL_CAPSULE | Freq: Every day | ORAL | Status: DC
Start: 2011-02-15 — End: 2011-02-20
  Administered 2011-02-15 – 2011-02-20 (×6): 180 mg via ORAL
  Filled 2011-02-15 (×7): qty 1

## 2011-02-15 MED ORDER — ENOXAPARIN SODIUM 100 MG/ML ~~LOC~~ SOLN
1.0000 mg/kg | Freq: Once | SUBCUTANEOUS | Status: AC
  Administered 2011-02-15: 90 mg via SUBCUTANEOUS
  Filled 2011-02-15: qty 1

## 2011-02-15 MED ORDER — FUROSEMIDE 10 MG/ML IJ SOLN
40.0000 mg | Freq: Every day | INTRAMUSCULAR | Status: DC
  Administered 2011-02-16 – 2011-02-18 (×3): 40 mg via INTRAVENOUS
  Filled 2011-02-15 (×3): qty 4

## 2011-02-15 MED ORDER — SODIUM CHLORIDE 0.9 % IV SOLN
Freq: Once | INTRAVENOUS | Status: AC
  Administered 2011-02-15: 09:00:00 via INTRAVENOUS

## 2011-02-15 MED ORDER — LISINOPRIL 5 MG PO TABS
5.0000 mg | ORAL_TABLET | Freq: Two times a day (BID) | ORAL | Status: DC
  Administered 2011-02-15 – 2011-02-16 (×2): 5 mg via ORAL
  Filled 2011-02-15 (×2): qty 1

## 2011-02-15 MED ORDER — ASPIRIN EC 81 MG PO TBEC
81.0000 mg | DELAYED_RELEASE_TABLET | Freq: Every day | ORAL | Status: DC
  Administered 2011-02-15 – 2011-02-19 (×5): 81 mg via ORAL
  Filled 2011-02-15 (×5): qty 1

## 2011-02-15 MED ORDER — DILTIAZEM HCL 25 MG/5ML IV SOLN
20.0000 mg | Freq: Once | INTRAVENOUS | Status: AC
  Administered 2011-02-15: 20 mg via INTRAVENOUS
  Filled 2011-02-15: qty 5

## 2011-02-15 NOTE — ED Notes (Signed)
Pt states SOB x 2 days.

## 2011-02-15 NOTE — ED Provider Notes (Signed)
History  Scribed for [employer]. Colon Branch, MD, the patient was seen in room APA15. This chart was scribed by Hillery Hunter.   CSN: [id number] Arrival date & time: 02/15/2011  7:51 AM   First MD Initiated Contact with Patient 02/15/11 [phone]      Chief Complaint  Patient presents with  . Shortness of Breath   The history is provided by the patient.    [name] is a 76 y.o. male who presents to the Emergency Department complaining of shortness of breath for two days. He states that he was reclining and watching television two days ago when he first felt short of breath. He describes associated chest pressure across the middle of his chest that worsens with occasional coughing. He states he felt cold two nights ago and hot last night with sweating and difficulty sleeping due to new orthopnea. He denies palpitations, nausea, vomiting, prior dx of afib. He has had a history of MI in 1998 and cardiac stents placed. He reports that he quit smoking cigarettes and quit drinking alcohol 18 years ago.  Time seen:  Past Medical History  Diagnosis Date  . Myocardial infarct   . Diabetes mellitus     Past Surgical History  Procedure Date  . Cholecystectomy   . Knee surgery   . Vasectomy   Stents placed [facility] - last seen in 2008)  No family history on file.  History  Substance Use Topics  . Smoking status: Former Games developer  . Smokeless tobacco: Not on file  . Alcohol Use: No      Review of Systems  Constitutional: Positive for chills and diaphoresis.  Respiratory: Positive for cough and shortness of breath.   Cardiovascular: Positive for chest pain. Negative for palpitations and leg swelling.  Gastrointestinal: Negative for nausea, vomiting and abdominal pain.  All other systems reviewed and are negative.    Allergies  Novocain  Home Medications   Current Outpatient Rx  Name Route Sig Dispense Refill  . ASPIRIN EC 81 MG PO TBEC Oral Take 81 mg by  mouth daily.      Marland Kitchen CLOPIDOGREL BISULFATE 75 MG PO TABS Oral Take 75 mg by mouth daily.      Marland Kitchen LISINOPRIL 20 MG PO TABS Oral Take 10 mg by mouth daily.      Marland Kitchen METFORMIN HCL 500 MG PO TABS Oral Take 500 mg by mouth 2 (two) times daily with a meal.      . METOPROLOL TARTRATE 25 MG PO TABS Oral Take 25 mg by mouth 2 (two) times daily.      Marland Kitchen NIACIN (ANTIHYPERLIPIDEMIC) 500 MG PO TBCR Oral Take 500 mg by mouth at bedtime.      Marland Kitchen FISH OIL BURP-LESS 1000 MG PO CAPS Oral Take 1,000 mg by mouth daily.      Marland Kitchen VITAMIN E 400 UNITS PO CAPS Oral Take 400 Units by mouth daily.        Triage vitals: BP 111/76  Pulse 147  Temp(Src) 97.8 F (36.6 C) (Oral)  Resp 16  Ht 5\' 4"  (1.626 m)  Wt 200 lb (90.719 kg)  BMI 34.33 kg/m2  SpO2 93%  Physical Exam  Constitutional: He is oriented to person, place, and time. He appears well-developed and well-nourished.  HENT:  Head: Normocephalic and atraumatic.  Eyes: Conjunctivae are normal. Right eye exhibits no discharge. Left eye exhibits no discharge.  Neck: Neck supple. No tracheal deviation present. No thyromegaly present.  No carotid bruits  Cardiovascular: An irregularly irregular rhythm present.  Pulmonary/Chest: He is in respiratory distress (mild). He has rales (both bases L > R).       Speaks in full sentences  Abdominal: Soft. He exhibits no distension. There is no tenderness.       obese  Musculoskeletal: He exhibits edema (1+ pitting BLE).  Neurological: He is alert and oriented to person, place, and time.  Skin: Skin is warm and dry.  Psychiatric: He has a normal mood and affect. His behavior is normal.    ED Course  Procedures   Labs Reviewed  CBC - Abnormal; Notable for the following:    WBC 10.8 (*)    All other components within normal limits  BASIC METABOLIC PANEL - Abnormal; Notable for the following:    Glucose, Bld 182 (*)    GFR calc non Af Amer 84 (*)    All other components within normal limits  URINALYSIS, ROUTINE W  REFLEX MICROSCOPIC - Abnormal; Notable for the following:    Color, Urine STRAW (*)    Specific Gravity, Urine >1.030 (*)    Hgb urine dipstick MODERATE (*)    Bilirubin Urine SMALL (*)    Ketones, ur TRACE (*)    Protein, ur 30 (*)    All other components within normal limits  URINE MICROSCOPIC-ADD ON - Abnormal; Notable for the following:    Bacteria, UA FEW (*)    All other components within normal limits  DIFFERENTIAL  TROPONIN I   No results found.   OTHER DATA REVIEWED: Nursing notes, vital signs, and past medical records reviewed.   DIAGNOSTIC STUDIES: Oxygen Saturation is 93% on room air, hypoxic by my interpretation.    08:05. EKG interpreted by EDMD: Afib with rapid ventricular response. Incomplete LBBB, flattening of ST waves in inferior and lateral leads. Compared with Aug 14, 2008: Afib is new.  ED COURSE / COORDINATION OF CARE: 08:20. Cardizem 20mg  IV bolus, IV NS 500cc bolus, diltiazem (CARDIZEM) injection 20 mg ; 0.9 % sodium chloride infusion ; CBC ; Differential ; Basic metabolic panel ; Troponin I ; Urinalysis with microscopic.  09:38. Rechecked patient. Cardiac monitor shows atrial fibrillation with improved rate between 88 and 95 beats per minute. Patient feels much improved and reports shortness of breath is improved.  Repeat pulse Ox shows 95% on 2L nasal cannula which is improved and adequate.  09:42. Discussed with Dr. Delbert Harness who agreed to admit patient.   MDM  Patient presents with shortness of breath in the setting of atrial fibrillation which is new for this patient. Cardizem effected a decrease in rate, improvement in breathing. O2 sats improved. Arrangement for admission with PCP. Dr. Janna Arch and cardiology consult with [place].Pt stable in ED with no significant deterioration in condition.The patient appears reasonably screened and/or stabilized for discharge and I doubt any other medical condition or other [facility] requiring further screening,  evaluation, or treatment in the ED at this time prior to discharge.  No diagnosis found. MDM Reviewed: nursing note and vitals Reviewed previous: labs Interpretation: labs, ECG and x-ray Total time providing critical care: 40.   I personally performed the services described in this documentation, which was scribed in my presence. The recorded information has been reviewed and considered.       Nicoletta Dress. Colon Branch, MD 02/15/11 [phone]

## 2011-02-15 NOTE — ED Notes (Signed)
Waiting for pharmacy to bring ordered Cardizem.

## 2011-02-15 NOTE — Progress Notes (Signed)
*  PRELIMINARY RESULTS* Echocardiogram 2D Echocardiogram has been performed.  Conrad Alachua 02/15/2011, 2:10 PM

## 2011-02-15 NOTE — Consult Note (Addendum)
ANTICOAGULATION CONSULT NOTE - Initial Consult  Pharmacy Consult for Lovenox and Warfarin Indication: atrial fibrillation  Allergies  Allergen Reactions  . Novocain    Patient Measurements: Height: 5\' 4"  (162.6 cm) Weight: 200 lb 9.9 oz (91 kg) IBW/kg (Calculated) : 59.2   Vital Signs: Temp: 97.3 F (36.3 C) (11/09 1114) Temp src: Oral (11/09 1114) BP: 108/72 mmHg (11/09 1114) Pulse Rate: 104  (11/09 1114)  Labs:  [facility] 02/15/11 0824  HGB 15.2  HCT 45.9  PLT 183  APTT --  LABPROT --  INR --  HEPARINUNFRC --  CREATININE 0.85  CKTOTAL --  CKMB --  TROPONINI <0.30   Estimated Creatinine Clearance: 77.5 ml/min (by C-G formula based on Cr of 0.85).  Medical History: Past Medical History  Diagnosis Date  . Myocardial infarct   . Diabetes mellitus   . Dysrhythmia     afib  . Coronary artery disease   . Shortness of breath    Medications:  Scheduled:    . sodium chloride   Intravenous Once  . aspirin EC  81 mg Oral Daily  . diltiazem  180 mg Oral Daily  . diltiazem  20 mg Intravenous Once  . diltiazem  25 mg Intravenous Once  . enoxaparin (LOVENOX) injection  1 mg/kg Subcutaneous Once  . enoxaparin (LOVENOX) injection  1 mg/kg Subcutaneous Q12H  . sodium chloride  500 mL Intravenous Once   Assessment: Renal fxn OK  Goal of Therapy: Full anticoagulation with Lovenox INR 2-3   Plan: Coumadin 5mg  today x 1 Lovenox 1mg /kg sq q12hrs. CBC per protocol  [name] A 02/15/2011,11:37 AM

## 2011-02-15 NOTE — H&P (Signed)
[id number] 

## 2011-02-15 NOTE — Consult Note (Signed)
CARDIOLOGY CONSULT NOTE  Patient ID: ENIS RIECKE MRN: [id number] DOB/AGE: Oct 12, 1934 76 y.o.  Admit date: 02/15/2011 Referring Physician: Delbert Harness Primary PhysicianDONDIEGO,[name] M, MD Primary Cardiologist: Hillarie Harrigan-lost to followup; previously [facility] Reason for Consultation: New Onset Afib Active Problems:  * No active hospital problems. *   HPI:  Mr. Lucena is a 76 year old male patient with a history of coronary artery disease status post large anterior wall myocardial infarction in April 1998 treated with angioplasty and stenting of the LAD.  He subsequently required angioplasty of the circumflex in 2004 and then stent placement in the RCA.  He is followed by Dr. Dietrich Pates. He has a history of ischemic CM with most recent EF of 30-40% in May of 2010.  I have no documentation of follow-up office visits since that time. He states he did not follow-up because he was feeling well and did not feel this to be necessary.   He was admitted through ER with progressive shortness of breath over 2 days with associated chest pressure. The night of admission he had orthopnea, causing him to have to sit in recliner to breath. He felt his chest squeezing when he tried to take a deep breath. On arrival to ER he was found to be in Atrial fib with HR of 147 bpm documented.Marland Kitchen  He was given IV diltiazem 20 mg and is now on po Cardizem CD 180 mg daily per admission orders. He unfortunately did not have CXR in ER or BNP. At this time he is feeling well, breathing much better.  He is unaware of irregular HR or how long he may been in this rhythm.  He denies chest pain other than the pressure he felt trying to breath.   Review of systems complete and found to be negative unless listed above   Past Medical History  Diagnosis Date  . Myocardial infarct   . Diabetes mellitus   . Dysrhythmia     afib  . Coronary artery disease   . Shortness of breath     History reviewed. No pertinent family history.    History   Social History  . Marital Status: Married    Spouse Name: N/A    Number of Children: N/A  . Years of Education: N/A   Occupational History  . Not on file.   Social History Main Topics  . Smoking status: Former Games developer  . Smokeless tobacco: Not on file  . Alcohol Use: No  . Drug Use: No  . Sexually Active:    Other Topics Concern  . Not on file   Social History Narrative  . No narrative on file    Past Surgical History  Procedure Date  . Cholecystectomy   . Knee surgery   . Vasectomy      Prescriptions prior to admission  Medication Sig Dispense Refill  . aspirin EC 81 MG tablet Take 81 mg by mouth daily.        . clopidogrel (PLAVIX) 75 MG tablet Take 75 mg by mouth daily.        Marland Kitchen lisinopril (PRINIVIL,ZESTRIL) 20 MG tablet Take 10 mg by mouth daily.        . metFORMIN (GLUCOPHAGE) 500 MG tablet Take 500 mg by mouth 2 (two) times daily with a meal.        . metoprolol tartrate (LOPRESSOR) 25 MG tablet Take 25 mg by mouth 2 (two) times daily.        . niacin (NIASPAN) 500 MG CR  tablet Take 500 mg by mouth at bedtime.        . Omega-3 Fatty Acids (FISH OIL BURP-LESS) 1000 MG CAPS Take 1,000 mg by mouth daily.        . vitamin E 400 UNIT capsule Take 400 Units by mouth daily.          Physical Exam: Blood pressure 108/72, pulse 104, temperature 97.3 F (36.3 C), temperature source Oral, resp. rate 20, height 5\' 4"  (1.626 m), weight 200 lb 9.9 oz (91 kg), SpO2 94.00%.  General: Well developed, well nourished, in no acute distress Head: Eyes PERRLA, No xanthomas.   Normal cephalic and atramatic  Lungs:  Bibasilar crackles without wheezes or rhonchi Heart: HRIR S1 S2, without MRG.  Pulses are 2+ & equal.           Soft carotid bruit. Mild JVD.  No abdominal bruits. No femoral bruits. Abdomen: Bowel sounds are positive, abdomen soft and non-tender without masses or                  Hernia's noted. Msk:  Back normal, normal gait. Normal strength and tone for  age. Extremities: No clubbing, cyanosis.  DP +1; trace edema Neuro: Alert and oriented X 3. Psych:  Good affect, responds appropriately  Labs:   Lab Results  Component Value Date   WBC 10.8* 02/15/2011   HGB 15.2 02/15/2011   HCT 45.9 02/15/2011   MCV 98.7 02/15/2011   PLT 183 02/15/2011    Lab 02/15/11 0824  NA 137  K 3.7  CL 99  CO2 26  BUN 12  CREATININE 0.85  CALCIUM 9.6  PROT --  BILITOT --  ALKPHOS --  ALT --  AST --  GLUCOSE 182*   Lab Results  Component Value Date   CKTOTAL 58 11/07/2006   CKMB 2.7 11/07/2006   TROPONINI <0.30 02/15/2011    Radiology: Dg Chest 2 View  02/15/2011  *RADIOLOGY REPORT*  Clinical Data: Congestive heart failure  CHEST - 2 VIEW  Comparison: Chest radiograph 08/10/2008  Findings: Cardiac silhouette is similar to prior.  There is a left and right mild basilar atelectasis.  There are small bilateral pleural effusions which are increased compared to prior.  No evidence of pulmonary edema.  No pneumothorax peri  IMPRESSION: 1 .  Basilar atelectasis and small effusions.  2.  Cardiomegaly.  Original Report Authenticated By: Genevive Bi, M.D.   RUE:[id number] fibrillation with rapid ventricular response Incomplete left bundle branch block Nonspecific T wave abnormality Rate of 147 bpm.   Echocardiogram:  Sizable area of the apical infarction; severely impaired left ventricular systolic function; no significant valvular abnormalities.  ASSESSMENT AND PLAN:   1. Atrial fib with RVR: New onset with unknown duration.  He became symptomatic with DOE, and PND over last two days. He cannot tell that his heart rate is irregular.  CHADs Score 3,(HTN, AGE, DM)  possibly 4 if he is found to have CHF.  He is currently rate controlled on p.o. cardizem CD. BP is tolerating this.  Lovenox is being utilized for bridging to coumadin which is being followed by the pharmacy. Consideration for TEE will be decided by Dr.Tarri Guilfoil after echo is read.   2. CAD:  Known  history of anterior wall MI with PCI in 1998, and 2004.  Cardiac enzymes are negative so far. Will continue to cycle these.  Keep on ASA and statin.  Will check fasting lipids and LFTs  3.  CHF:  There  is some evidence of fluid overload, with mild edema in the extremities and crackles in the lungs.  Check CXR and BNP .  He is not on any diuretic at this time. May need to consider this in the setting of Atrial fib precipitating CHF.  Will begin Lasix 40 mg IV daily for now and monitor lytes. Stop IV fluids. Would not check D-dimer as he is already on anticoagulation.   Signed: Bettey Mare. Lyman Bishop NP [facility] 02/15/2011, 12:55 PM   Cardiology Attending Patient interviewed and examined. Discussed with Joni Reining, NP.  Above note annotated and modified based upon my findings.   It is unclear whether atrial fibrillation is the result of decompensation of his long-standing ischemic cardiomyopathy or whether it is the cause of his CHF. In any case, heart rate control remains suboptimal. He has enough  LV dysfunction to merit long-term treatment with beta blockers. Carvedilol will be added to his regime.   Addition of an ACE inhibitor is also appropriate. Anticoagulation has been initiated with full dose enoxaparin pending adjustment of warfarin dosage. Spironolactone will be useful at some point as well. In the absence of pulmonary edema, modest doses of furosemide will be utilized to achieve a gentle diuresis.  Kennedale Bing, MD

## 2011-02-15 NOTE — H&P (Signed)
[name], [name]                  ACCOUNT NO.:  [id number]  MEDICAL RECORD NO.:  [id number]  LOCATION:  A320                          FACILITY:  [facility]  PHYSICIAN:  Melvyn Novas, MDDATE OF BIRTH:  1934/11/22  DATE OF ADMISSION:  02/15/2011 DATE OF DISCHARGE:  LH                             HISTORY & PHYSICAL   The patient is a 76 year old white male status post MI in 1997 status post stenting x2 in 2 different vessels with a history of hypertension, hyperlipidemia, and type 2 diabetes who complained of a 2-day history of increasing dyspnea and orthopnea.  He specifically denied anginal chest pain.  Denied palpitations, syncope, or dizziness.  He was seen in the ER, found to have AFib with a rapid ventricular response, ventricular rate approximately 160, given IV Cardizem, and then subsequently controlled on p.o. Cardizem.  Initial troponins were negative.  He is admitted for control of AFib, which is new presumably several days old and anticoagulation fully with Lovenox and an oral agent.  PAST MEDICAL HISTORY:  Significant for the aforementioned type 2 diabetes, hypertension, erectile dysfunction, COPD, gout, hyperlipidemia, coronary artery disease status post MI in 1998, status post stenting x2.  He also has left knee degenerative joint disease.  PAST SURGICAL HISTORY:  He has had a coronary artery stenting x2.  He has had left knee arthroscopic surgery and a vasectomy and cholecystectomy and cataract excision of the right eye.  ALLERGIES:  He has no known allergies.  CURRENT MEDICINES: 1. Fish oil 1000 mg p.o. daily. 2. Pravachol 40 mg p.o. daily. 3. Niaspan 500 mg p.o. daily. 4. Lisinopril 20/12.5 p.o. daily. 5. Metoprolol 25 mg p.o. b.i.d. 6. Plavix 75 mg p.o. daily. 7. Vitamin E 400 units p.o. daily.  SOCIAL HISTORY:  He smoked for 40 years.  He quit in 1998.  He is currently in [employer] and worked as a Music therapist for many years.  He has a  history of working with asbestos also.  FAMILY HISTORY:  Essentially unremarkable.  REVIEW OF SYSTEMS:  Negative for polyuria, polydipsia, nausea, vomiting, melena, hematemesis, hematochezia, diaphoresis, weight loss, or anginal chest pain.  PHYSICAL EXAMINATION:  VITAL SIGNS:  Blood pressure 108/72, temperature 97.3, pulse 104 and irregularly irregular, respiratory rate is 20. EYES:  PERRLA.  EOMs intact.  Sclerae clear.  Conjunctivae pink. NECK:  No JVD.  No carotid bruits.  No thyromegaly or thyroid bruits. LUNGS:  Diminished breath sounds at the bases.  Prolonged expiratory phase.  No rales, wheeze, or rhonchi appreciable. HEART:  Irregularly irregular.  No S3 gallop auscultated.  No heaves, thrills, or rubs. ABDOMEN:  Obese, soft.  No detectable organomegaly.  No guarding or rebound. EXTREMITIES:  Trace to 1+ pedal edema.  Peripheral pulses are 1+ and intact bilaterally. NEUROLOGIC:  Cranial nerves II through XII grossly intact.  The patient moves all 4 extremities.  Plantars are downgoing.  IMPRESSION: 1. New onset atrial fibrillation with rapid ventricular response,     currently controlled. 2. Coronary artery disease status post myocardial infarction with     subsequent stenting x2 in 2 vessels. 3. Hyperlipidemia. 4. Type 2 diabetes. 5. Hypertension. 6. Forty-pack year  history of smoking, quit in 1998. 7. Presumed benign prostatic hypertrophy. 8. Erectile dysfunction. 9. Left knee degenerative joint disease.  PLAN:  At present is to do cardiac enzymes.  Get 2D echocardiogram, thyroid testing.  Continue Cardizem 180 p.o. daily and all other current medicines.  Cardiology consultation and Lovenox full dose for anticoagulation with subsequent addition of oral agent Coumadin.  We will also add Flomax 0.4 p.o. daily.     Melvyn Novas, MD     RMD/MEDQ  D:  02/15/2011  T:  02/15/2011  Job:  [id number]

## 2011-02-15 NOTE — ED Notes (Signed)
EDP aware of VS after Cardizem given, specialized orders given.

## 2011-02-16 ENCOUNTER — Encounter (HOSPITAL_COMMUNITY): Payer: Self-pay | Admitting: *Deleted

## 2011-02-16 LAB — HEPATIC FUNCTION PANEL
ALT: 12 U/L (ref 0–53)
AST: 15 U/L (ref 0–37)
Bilirubin, Direct: 0.2 mg/dL (ref 0.0–0.3)
Total Bilirubin: 0.8 mg/dL (ref 0.3–1.2)

## 2011-02-16 LAB — CBC
HCT: 41.3 % (ref 39.0–52.0)
Hemoglobin: 13.8 g/dL (ref 13.0–17.0)
MCV: 100.2 fL — ABNORMAL HIGH (ref 78.0–100.0)
RBC: 4.12 MIL/uL — ABNORMAL LOW (ref 4.22–5.81)
WBC: 7.7 10*3/uL (ref 4.0–10.5)

## 2011-02-16 LAB — BASIC METABOLIC PANEL
BUN: 13 mg/dL (ref 6–23)
Creatinine, Ser: 1.05 mg/dL (ref 0.50–1.35)
GFR calc Af Amer: 79 mL/min — ABNORMAL LOW (ref >90)
GFR calc non Af Amer: 68 mL/min — ABNORMAL LOW (ref >90)

## 2011-02-16 LAB — GLUCOSE, CAPILLARY: Glucose-Capillary: 150 mg/dL — ABNORMAL HIGH (ref 70–99)

## 2011-02-16 LAB — CARDIAC PANEL(CRET KIN+CKTOT+MB+TROPI)
CK, MB: 6.3 ng/mL (ref 0.3–4.0)
Relative Index: INVALID (ref 0.0–2.5)

## 2011-02-16 LAB — HEMOGLOBIN A1C: Hgb A1c MFr Bld: 7.2 % — ABNORMAL HIGH (ref <5.7)

## 2011-02-16 MED ORDER — CARVEDILOL 3.125 MG PO TABS
6.2500 mg | ORAL_TABLET | Freq: Two times a day (BID) | ORAL | Status: DC
  Administered 2011-02-17 – 2011-02-18 (×3): 6.25 mg via ORAL
  Filled 2011-02-16 (×4): qty 2

## 2011-02-16 MED ORDER — FUROSEMIDE 10 MG/ML IJ SOLN: 40.0000 mg | Freq: Once | INTRAMUSCULAR | Status: DC

## 2011-02-16 NOTE — Progress Notes (Signed)
[name], [name]                  ACCOUNT NO.:  [id number]  MEDICAL RECORD NO.:  [id number]  LOCATION:  A320                          FACILITY:  [facility]  PHYSICIAN:  Melvyn Novas, MDDATE OF BIRTH:  11/27/1934  DATE OF PROCEDURE:  02/16/2011 DATE OF DISCHARGE:                                PROGRESS NOTE   PROBLEMS: 1. New onset atrial fibrillation with rapid ventricular response. 2. Ischemic cardiomyopathy, status post myocardial infarction in 1998     with severely reduced ejection fraction akinetic anteroseptal wall     and apex and hypokinesis in other segments. 3. Hypertension 4. Hyperlipidemia. 5. Type 2 diabetes.  The patient's rate is controlled with oral Cardizem 180 p.o. daily.  PHYSICAL EXAMINATION:  VITAL SIGNS:  Blood pressure 114/76, temperature 97, pulse is 104 and regular, and respiratory rate is 24. NECK:  No JVD. LUNGS:  Diminished breath sounds at bases.  No rales, wheezes, or rhonchi appreciable. HEART:  Irregular regular.  No S3 auscultated.  Hemoglobin 15.2, potassium 3.9, creatinine 1.05.  PLAN:  Right now is to add Lasix 40 mg IV today and tomorrow just 2 doses, continue Cardizem, will consider carvedilol in addition low dose, continue Lovenox full dose and with bridging Coumadin therapy per pharmacy protocol, and we will monitor electrolytes and hemodynamics.     Melvyn Novas, MD     RMD/MEDQ  D:  02/16/2011  T:  02/16/2011  Job:  [id number]

## 2011-02-16 NOTE — Progress Notes (Signed)
[id number] 

## 2011-02-16 NOTE — Progress Notes (Signed)
CRITICAL VALUE ALERT  Critical value received:  MB 6.3   Date of notification:  02/16/11  Time of notification:  0020  Critical value read back:yes  Nurse who received alert:  N. Phylliss Bob, RN  MD notified (1st page):  Sudie Bailey  Time of first page:  0030  MD notified (2nd page):  Time of second page:  Responding MD:  Sudie Bailey  Time MD responded:  0030  Dr. Sudie Bailey notified of patients critical lab MB 6.3, previous 5.9. Patient sleeping with no complaints. No new orders at this time

## 2011-02-17 LAB — GLUCOSE, CAPILLARY
Glucose-Capillary: 177 mg/dL — ABNORMAL HIGH (ref 70–99)
Glucose-Capillary: 144 mg/dL — ABNORMAL HIGH (ref 70–99)
Glucose-Capillary: 138 mg/dL — ABNORMAL HIGH (ref 70–99)

## 2011-02-17 LAB — HEPATIC FUNCTION PANEL
ALT: 11 U/L (ref 0–53)
Albumin: 3.3 g/dL — ABNORMAL LOW (ref 3.5–5.2)
Alkaline Phosphatase: 58 U/L (ref 39–117)
Total Bilirubin: 0.5 mg/dL (ref 0.3–1.2)
Total Protein: 6.1 g/dL (ref 6.0–8.3)

## 2011-02-17 LAB — BASIC METABOLIC PANEL
BUN: 16 mg/dL (ref 6–23)
CO2: 28 mEq/L (ref 19–32)
Calcium: 9.3 mg/dL (ref 8.4–10.5)
Creatinine, Ser: 0.91 mg/dL (ref 0.50–1.35)
GFR calc non Af Amer: 81 mL/min — ABNORMAL LOW (ref >90)
Glucose, Bld: 165 mg/dL — ABNORMAL HIGH (ref 70–99)
Sodium: 135 mEq/L (ref 135–145)

## 2011-02-17 LAB — CBC
HCT: 40.9 % (ref 39.0–52.0)
Hemoglobin: 13.9 g/dL (ref 13.0–17.0)
MCHC: 34 g/dL (ref 30.0–36.0)
RBC: 4.15 MIL/uL — ABNORMAL LOW (ref 4.22–5.81)

## 2011-02-17 MED ORDER — POTASSIUM CHLORIDE CRYS ER 20 MEQ PO TBCR
20.0000 meq | EXTENDED_RELEASE_TABLET | Freq: Two times a day (BID) | ORAL | Status: DC
  Administered 2011-02-17 – 2011-02-20 (×7): 20 meq via ORAL
  Filled 2011-02-17 (×7): qty 1

## 2011-02-17 MED ORDER — WARFARIN VIDEO
Freq: Once | Status: AC
  Administered 2011-02-17: 1
  Filled 2011-02-17: qty 1

## 2011-02-17 MED ORDER — PATIENT'S GUIDE TO USING COUMADIN BOOK
Freq: Once | Status: AC
  Administered 2011-02-17: 1
  Filled 2011-02-17: qty 1

## 2011-02-17 MED ORDER — WARFARIN SODIUM 10 MG PO TABS
10.0000 mg | ORAL_TABLET | ORAL | Status: AC
  Administered 2011-02-17: 10 mg via ORAL
  Filled 2011-02-17 (×2): qty 1

## 2011-02-17 NOTE — Progress Notes (Signed)
[name], [name]                  ACCOUNT NO.:  [id number]  MEDICAL RECORD NO.:  [id number]  LOCATION:  A320                          FACILITY:  [facility]  PHYSICIAN:  Mila Homer. Sudie Bailey, M.D.DATE OF BIRTH:  Jul 17, 1934  DATE OF PROCEDURE: DATE OF DISCHARGE:                                PROGRESS NOTE   SUBJECTIVE:  The patient says he feels "100% better".  He was having a good deal of shortness of breath and chest heaviness before he came to the hospital and his atrial fibrillation/rapid ventricular response was diagnosed and treated.  OBJECTIVE:  VITAL SIGNS:  Pulse is 106, blood pressure 102/72. GENERAL:  He is sitting up in the chair.  His color is good.  He is oriented and alert. HEART:  Has an irregular irregularity, rate of about 90. LUNGS:  Show decreased breath sounds throughout but he is moving air well.  He has just got trace edema of the ankles.  Today his hemoglobin is 13.9.  His potassium had dropped from 3.9 to 3.3.  ASSESSMENT: 1. Atrial fibrillation/rapid ventricular response, currently rate     controlled. 2. Hypokalemia. 3. Ischemic cardiomyopathy. 4. Benign essential hypertension. 5. Type 2 diabetes.  PLAN:  Add KCl 20 mEq b.i.d.  Recheck a BMP tomorrow.  We discussed his warfarin anticoagulation and the risks and benefits of this.     Mila Homer. Sudie Bailey, M.D.     SDK/MEDQ  D:  02/17/2011  T:  02/17/2011  Job:  [id number]

## 2011-02-17 NOTE — Consult Note (Signed)
ANTICOAGULATION CONSULT NOTE -Consult  Pharmacy Consult for Lovenox and Warfarin Indication: atrial fibrillation  Allergies  Allergen Reactions  . Novocain    Patient Measurements: Height: 5\' 4"  (162.6 cm) Weight: 209 lb 8 oz (95.029 kg) IBW/kg (Calculated) : 59.2   Vital Signs: Temp: 97.6 F (36.4 C) (11/10 2045) Temp src: Oral (11/10 2045) BP: 102/70 mmHg (11/11 0800) Pulse Rate: 106  (11/11 0800)  Labs:  Basename 02/17/11 0415 02/16/11 0650 02/15/11 2313 02/15/11 1540 02/15/11 0824  HGB 13.9 13.8 -- -- --  HCT 40.9 41.3 -- -- 45.9  PLT 183 167 -- -- 183  APTT -- -- -- -- --  LABPROT 15.2 -- -- -- --  INR 1.18 -- -- -- --  HEPARINUNFRC -- -- -- -- --  CREATININE 0.91 1.05 -- -- 0.85  CKTOTAL -- 94 118 103 --  CKMB -- 5.4* 6.3* 5.9* --  TROPONINI -- <0.30 <0.30 <0.30 --   Estimated Creatinine Clearance: 74 ml/min (by C-G formula based on Cr of 0.91).  Medical History: Past Medical History  Diagnosis Date  . Myocardial infarct   . Diabetes mellitus   . Dysrhythmia     afib  . Coronary artery disease   . Shortness of breath   . COPD (chronic obstructive pulmonary disease)   . Gout   . DJD (degenerative joint disease)    Medications:  Scheduled:     . aspirin EC  81 mg Oral Daily  . carvedilol  6.25 mg Oral BID WC  . diltiazem  180 mg Oral Daily  . enoxaparin (LOVENOX) injection  1 mg/kg Subcutaneous Q12H  . furosemide  40 mg Intravenous Daily  . furosemide  40 mg Intravenous Once  . metFORMIN  500 mg Oral BID WC  . pravastatin  40 mg Oral Daily  . Tamsulosin HCl  0.4 mg Oral Daily  . warfarin  10 mg Oral NOW  . DISCONTD: carvedilol  12.5 mg Oral BID WC  . DISCONTD: lisinopril  5 mg Oral BID   Assessment: Renal function parameters are within normal limits and stable. INR subtherapeutic  Goal of Therapy: Full anticoagulation with Lovenox INR 2-3   Plan: Coumadin 10mg  today x 1 now Lovenox 1mg /kg sq q12hrs. CBC per protocol  Gilman Buttner,  [place] J 02/17/2011,8:44 AM

## 2011-02-18 ENCOUNTER — Other Ambulatory Visit: Payer: Self-pay

## 2011-02-18 LAB — GLUCOSE, CAPILLARY
Glucose-Capillary: 194 mg/dL — ABNORMAL HIGH (ref 70–99)
Glucose-Capillary: 155 mg/dL — ABNORMAL HIGH (ref 70–99)

## 2011-02-18 LAB — PROTIME-INR
INR: 1.37 (ref 0.00–1.49)
Prothrombin Time: 17.1 seconds — ABNORMAL HIGH (ref 11.6–15.2)

## 2011-02-18 LAB — CBC
Hemoglobin: 13.5 g/dL (ref 13.0–17.0)
MCH: 32.9 pg (ref 26.0–34.0)
MCHC: 33.3 g/dL (ref 30.0–36.0)
RDW: 13.7 % (ref 11.5–15.5)

## 2011-02-18 LAB — BASIC METABOLIC PANEL
CO2: 30 mEq/L (ref 19–32)
Calcium: 9.7 mg/dL (ref 8.4–10.5)
GFR calc non Af Amer: 82 mL/min — ABNORMAL LOW (ref >90)
Potassium: 3.9 mEq/L (ref 3.5–5.1)
Sodium: 136 mEq/L (ref 135–145)

## 2011-02-18 LAB — HEPATIC FUNCTION PANEL
Albumin: 3.4 g/dL — ABNORMAL LOW (ref 3.5–5.2)
Alkaline Phosphatase: 54 U/L (ref 39–117)
Total Protein: 6.3 g/dL (ref 6.0–8.3)

## 2011-02-18 MED ORDER — DIGOXIN 0.25 MG/ML IJ SOLN
0.5000 mg | Freq: Once | INTRAMUSCULAR | Status: AC
  Administered 2011-02-18: 0.5 mg via INTRAVENOUS

## 2011-02-18 MED ORDER — DIGOXIN 250 MCG PO TABS
0.2500 mg | ORAL_TABLET | Freq: Every day | ORAL | Status: DC
  Administered 2011-02-19 – 2011-02-20 (×2): 0.25 mg via ORAL
  Filled 2011-02-18 (×3): qty 1

## 2011-02-18 MED ORDER — CARVEDILOL 12.5 MG PO TABS
12.5000 mg | ORAL_TABLET | Freq: Two times a day (BID) | ORAL | Status: DC
  Administered 2011-02-19 – 2011-02-20 (×3): 12.5 mg via ORAL
  Filled 2011-02-18 (×3): qty 1

## 2011-02-18 MED ORDER — LISINOPRIL 5 MG PO TABS
2.5000 mg | ORAL_TABLET | Freq: Every day | ORAL | Status: DC
  Administered 2011-02-18 – 2011-02-20 (×3): 2.5 mg via ORAL
  Filled 2011-02-18 (×3): qty 1

## 2011-02-18 MED ORDER — WARFARIN SODIUM 10 MG PO TABS
10.0000 mg | ORAL_TABLET | Freq: Once | ORAL | Status: AC
  Administered 2011-02-18: 10 mg via ORAL
  Filled 2011-02-18: qty 1

## 2011-02-18 MED ORDER — DIGOXIN 0.25 MG/ML IJ SOLN
0.2500 mg | Freq: Once | INTRAMUSCULAR | Status: AC
  Administered 2011-02-18: 0.25 mg via INTRAVENOUS
  Filled 2011-02-18 (×2): qty 2

## 2011-02-18 MED ORDER — SPIRONOLACTONE 25 MG PO TABS
25.0000 mg | ORAL_TABLET | Freq: Every day | ORAL | Status: DC
  Administered 2011-02-18 – 2011-02-20 (×3): 25 mg via ORAL
  Filled 2011-02-18 (×3): qty 1

## 2011-02-18 MED ORDER — DIGOXIN 0.25 MG/ML IJ SOLN
0.2500 mg | Freq: Once | INTRAMUSCULAR | Status: AC
  Administered 2011-02-19: 0.25 mg via INTRAVENOUS
  Filled 2011-02-18: qty 2

## 2011-02-18 MED ORDER — FUROSEMIDE 40 MG PO TABS
40.0000 mg | ORAL_TABLET | Freq: Two times a day (BID) | ORAL | Status: DC
  Administered 2011-02-18 – 2011-02-20 (×4): 40 mg via ORAL
  Filled 2011-02-18 (×4): qty 1

## 2011-02-18 NOTE — Progress Notes (Addendum)
SUBJECTIVE:Feels better, breathing better. Wants to go home.  Active Problems:  Atrial fibrillation with RVR  CARDIOMYOPATHY, ISCHEMIC  Filed Vitals:   02/17/11 1500 02/17/11 1609 02/17/11 1700 02/18/11 0524  BP: 100/70  94/68 125/84  Pulse: 114  100 100  Temp: 98 F (36.7 C)   97.4 F (36.3 C)  TempSrc:    Oral  Resp: 18     Height:      Weight:      SpO2: 94% 97%  95%    Intake/Output Summary (Last 24 hours) at 02/18/11 1637 Last data filed at 02/18/11 1500  Gross per 24 hour  Intake 1078.33 ml  Output      0 ml  Net 1078.33 ml   WT: On admission 200 lbs.    LABS: Basic Metabolic Panel:  Basename 02/18/11 0401 02/17/11 0415  NA 136 135  K 3.9 3.3*  CL 99 100  CO2 30 28  GLUCOSE 153* 165*  BUN 15 16  CREATININE 0.89 0.91  CALCIUM 9.7 9.3  MG -- --  PHOS -- --   Liver Function Tests:  [facility] 02/18/11 0401 02/17/11 0415  AST 18 13  ALT 13 11  ALKPHOS 54 58  BILITOT 0.3 0.5  PROT 6.3 6.1  ALBUMIN 3.4* 3.3*   CBC:  Basename 02/18/11 0401 02/17/11 0415  WBC 7.6 7.7  NEUTROABS -- --  HGB 13.5 13.9  HCT 40.6 40.9  MCV 99.0 98.6  PLT 163 183   Cardiac Enzymes:  Basename 02/16/11 0650 02/15/11 2313  CKTOTAL 94 118  CKMB 5.4* 6.3*  CKMBINDEX -- --  TROPONINI <0.30 <0.30   ECHO:02/15/2011  Left ventricle: The cavity size was mildly dilated. Systolic function appeared to be severely reduced. Akinesis of the distal anteroseptal and apical myocardium. Hypokinesis of the proximal septum and lateral wall. - Aortic valve: Mildly calcified annulus. Mildly thickened, mildly calcified leaflets. - Mitral valve: Calcified annulus. - Left atrium: The atrium was mildly to moderately dilated. - Pericardium, extracardiac: A trivial pericardial effusion was identified along the right ventricular free wall.    RADIOLOGY: Dg Chest 2 View  02/15/2011  *RADIOLOGY REPORT*  Clinical Data: Congestive heart failure  CHEST - 2 VIEW  Comparison: Chest radiograph  08/10/2008  Findings: Cardiac silhouette is similar to prior.  There is a left and right mild basilar atelectasis.  There are small bilateral pleural effusions which are increased compared to prior.  No evidence of pulmonary edema.  No pneumothorax peri  IMPRESSION: 1 .  Basilar atelectasis and small effusions.  2.  Cardiomegaly.  Original Report Authenticated By: Genevive Bi, M.D.    PHYSICAL EXAM General: Well developed, well nourished, in no acute distress Head: Eyes PERRLA, No xanthomas.   Normal cephalic and atramatic Patient Active Problem List  Diagnoses  . DIABETES MELLITUS, TYPE II  . HYPERLIPIDEMIA-MIXED  . OBSTRUCTIVE SLEEP APNEA  . CARDIOMYOPATHY, ISCHEMIC  . Atrial fibrillation with RVR    Patient Active Problem List  Diagnoses  . DIABETES MELLITUS, TYPE II  . HYPERLIPIDEMIA-MIXED  . OBSTRUCTIVE SLEEP APNEA  . CARDIOMYOPATHY, ISCHEMIC  . Atrial fibrillation with RVR    Lungs: Clear bilaterally to auscultation and percussion. Heart: HRRR S1 S2, No MRG .  Pulses are 2+ & equal.            No carotid bruit. No JVD.  No abdominal bruits. No femoral bruits. Abdomen: Bowel sounds are positive, abdomen soft and non-tender without masses or  Hernia's noted. Msk:  Back normal, normal gait. Normal strength and tone for age. Extremities: No clubbing, cyanosis or edema.  DP +1 Neuro: Alert and oriented X 3. Psych:  Good affect, responds appropriately  TELEMETRY: Reviewed telemetry pt in Atrial fib rates 100-120 bpm.  ASSESSMENT AND PLAN:  1. Atrial fib with RVR: Rate is not well controlled at present. Will increase carvedilol to 12.5 mg BID.  Uncertain what EF % is as not clearly documented on recent echo.  Only states that EF appears to be severely reduced.  Would not recommend CCB increase in this setting. Add Digoxin and spironolactone to medication regimen.  Consider TEE for better view of EF vs Muga scan at [name] discretion for better  measurements. Repeat BMET in am and d/c IV fluids. He continues on coumadin per pharmacy. Will need close follow-up as OP in our coumadin clinic.  2. CAD: Had a large anterior wall myocardial infarction in April 1998 treated with angioplasty and stenting of the LAD. He subsequently required angioplasty of the circumflex in 2004 and then stent placement in the RCA.He has a history of ischemic CM with most recent EF of 30-40% in May of 2010. May consider repeat catheterization at Dr.Jaydrian Corpening's discretion.   3. CHF: He appears compensated at present.  LEE is much improved, however, there is some dusky color in dependent position. Decreased EF may be contributing to this.  I will take him off IV lasix and place him on po dosing at  40 mg BID (formerly on 40 mg IV BID). Will reinstate daily wts and I/O as this is not being consistantly completed. Wt is up 9 lbs since recorded wt on admission. Uncertain what his dry wt will eventually be.In 2010 wt was 94.3 kg (207 lbs).   Cardiology Attending Patient interviewed and examined. Discussed with Joni Reining, NP.  Above note annotated and modified based upon my findings.  Mr. Pondexter is improved, but ventricular rate is still too high.  Agree with plans for digoxin load and up titration of carvedilol while decreasing the dose of diltiazem. Urine output has not been measured. Weight has actually increased.  Chest x-ray showed only small pleural effusions; obtaining a repeat study would not likely provide [place] with much information regarding Mr. Iwasaki's fluid status. Further adjustments in medications can likely be performed successfully on an outpatient basis. Discharge likely can proceed in the morning, especially if heart rate is adequately controlled.   Bing, MD 02/18/2011, 5:53 PM

## 2011-02-18 NOTE — Clinical Documentation Improvement (Signed)
CHF DOCUMENTATION CLARIFICATION QUERY  Please update your documentation within the medical record to reflect your response to this query.                                                                                     02/18/11  Dear Dr.[facility],  In a better effort to capture your patient's severity of illness, reflect appropriate length of stay and utilization of resources, a review of the patient medical record has revealed the following indicators.    Based on your clinical judgment, please clarify and document in a progress note and/or discharge summary the clinical condition associated with the following supporting information:  In responding to this query please exercise your independent judgment.  The fact that a query is asked, does not imply that any particular answer is desired or expected.  Possible Clinical Conditions?  Chronic Systolic Congestive Heart Failure Chronic Diastolic Congestive Heart Failure Chronic Systolic & Diastolic Congestive Heart Failure Acute Systolic Congestive Heart Failure Acute Diastolic Congestive Heart Failure Acute Systolic & Diastolic Congestive Heart Failure Acute on Chronic Systolic Congestive Heart Failure Acute on Chronic Diastolic Congestive Heart Failure Acute on Chronic Systolic & Diastolic  Congestive Heart Failure Other Condition________________________________________ Cannot Clinically Determine  Clinical Information:   Risk Factors: 11/9 Cardiology consult one of the diagnosis is CHF Newly diagnosed A Fib with RVR Hx of Ischemic cardiomyopathy  Signs & Symptoms: Fluid overload Mild JVD Trace edema Cardiomegaly on CXR  Diagnostics: Lab: BNP: 2507  Echo results: EF: May 2010=30-40%  EKG: A Fib w/RVR  Radiology: CXR=Cardiomegaly  Treatment: Diuretics: IV Lasix 40mg  daily   Reviewed: No response to query  Thank You,  Sincerely, Harless Litten RN, MSN Clinical Documentation  Specialist Office# [phone] Health Information Management [place]

## 2011-02-18 NOTE — Consult Note (Addendum)
ANTICOAGULATION CONSULT NOTE -Consult  Pharmacy Consult for Lovenox and Warfarin Indication: atrial fibrillation  Allergies  Allergen Reactions  . Novocain    Patient Measurements: Height: 5\' 4"  (162.6 cm) Weight: 209 lb 8 oz (95.029 kg) IBW/kg (Calculated) : 59.2   Vital Signs: Temp: 97.4 F (36.3 C) (11/12 0524) Temp src: Oral (11/12 0524) BP: 125/84 mmHg (11/12 0524) Pulse Rate: 100  (11/12 0524)  Labs:  Basename 02/18/11 0401 02/17/11 0415 02/16/11 0650 02/15/11 2313 02/15/11 1540  HGB 13.5 13.9 -- -- --  HCT 40.6 40.9 41.3 -- --  PLT 163 183 167 -- --  APTT -- -- -- -- --  LABPROT 17.1* 15.2 -- -- --  INR 1.37 1.18 -- -- --  HEPARINUNFRC -- -- -- -- --  CREATININE 0.89 0.91 1.05 -- --  CKTOTAL -- -- 94 118 103  CKMB -- -- 5.4* 6.3* 5.9*  TROPONINI -- -- <0.30 <0.30 <0.30   Estimated Creatinine Clearance: 75.7 ml/min (by C-G formula based on Cr of 0.89).  Medical History: Past Medical History  Diagnosis Date  . Myocardial infarct   . Diabetes mellitus   . Dysrhythmia     afib  . Coronary artery disease   . Shortness of breath   . COPD (chronic obstructive pulmonary disease)   . Gout   . DJD (degenerative joint disease)    Medications:  Scheduled:     . aspirin EC  81 mg Oral Daily  . carvedilol  6.25 mg Oral BID WC  . diltiazem  180 mg Oral Daily  . enoxaparin (LOVENOX) injection  1 mg/kg Subcutaneous Q12H  . furosemide  40 mg Intravenous Daily  . furosemide  40 mg Intravenous Once  . metFORMIN  500 mg Oral BID WC  . patient's guide to using coumadin book   Does not apply Once  . potassium chloride  20 mEq Oral BID  . pravastatin  40 mg Oral Daily  . Tamsulosin HCl  0.4 mg Oral Daily  . warfarin  10 mg Oral NOW  . warfarin   Does not apply Once   Assessment: Renal function parameters are within normal limits and stable. INR subtherapeutic  Goal of Therapy: Full anticoagulation with Lovenox INR 2-3   Plan: Coumadin 10mg  today. Lovenox  1mg /kg sq q12hrs. CBC per protocol Patient and family educated.  Gilman Buttner, [place] J 02/18/2011,8:29 AM

## 2011-02-18 NOTE — Progress Notes (Signed)
[name], [name]                  ACCOUNT NO.:  [id number]  MEDICAL RECORD NO.:  [id number]  LOCATION:  A320                          FACILITY:  [facility]  PHYSICIAN:  Melvyn Novas, MDDATE OF BIRTH:  1934-10-15  DATE OF PROCEDURE: DATE OF DISCHARGE:                                PROGRESS NOTE   The patient has AFib with rapid ventricular response, better controlled, right now on carvedilol and diltiazem.  Blood pressure 125/84, temperature 97.4, pulse is 100 and regular, O2 sat is 95%.  Hemoglobin 13.5, creatinine 0.89.  The patient has less dyspnea.  Neck shows no JVD.  No carotid bruits.  No thyromegaly.  No thyroid bruits.  Lungs show diminished breath sounds at bases.  No rales, wheeze, or rhonchi appreciable.  Heart is regular rate and rhythm.  No S3 or gallop auscultated.  The patient has ischemic cardiomyopathy, severe depressed LV dysfunction from MIs in 1998.  He is status post stenting.  He likewise has type 2 diabetes, hypertension, hyperlipidemia.  All his other factors are well controlled.  PLAN:  Right now is to continue Lovenox full dose and Coumadin as per pharmacy protocol.  Allow the Coumadin to become therapeutic.  PT/INR is 1.37 this morning.     Melvyn Novas, MD     RMD/MEDQ  D:  02/18/2011  T:  02/18/2011  Job:  [phone]

## 2011-02-18 NOTE — Progress Notes (Signed)
[id number] 

## 2011-02-19 LAB — CBC
HCT: 45.4 % (ref 39.0–52.0)
Hemoglobin: 15.1 g/dL (ref 13.0–17.0)
MCV: 98.3 fL (ref 78.0–100.0)
WBC: 9 10*3/uL (ref 4.0–10.5)

## 2011-02-19 LAB — GLUCOSE, CAPILLARY
Glucose-Capillary: 187 mg/dL — ABNORMAL HIGH (ref 70–99)
Glucose-Capillary: 161 mg/dL — ABNORMAL HIGH (ref 70–99)

## 2011-02-19 LAB — BASIC METABOLIC PANEL
BUN: 16 mg/dL (ref 6–23)
Chloride: 96 mEq/L (ref 96–112)
Glucose, Bld: 148 mg/dL — ABNORMAL HIGH (ref 70–99)
Potassium: 3.8 mEq/L (ref 3.5–5.1)

## 2011-02-19 LAB — PRO B NATRIURETIC PEPTIDE: Pro B Natriuretic peptide (BNP): 1521 pg/mL — ABNORMAL HIGH (ref 0–125)

## 2011-02-19 MED ORDER — WARFARIN SODIUM 7.5 MG PO TABS
7.5000 mg | ORAL_TABLET | Freq: Once | ORAL | Status: AC
  Administered 2011-02-19: 7.5 mg via ORAL
  Filled 2011-02-19: qty 1

## 2011-02-19 MED ORDER — SODIUM CHLORIDE 0.9 % IJ SOLN
INTRAMUSCULAR | Status: AC
  Administered 2011-02-19: 10 mL
  Filled 2011-02-19: qty 3

## 2011-02-19 NOTE — Progress Notes (Signed)
SUBJECTIVE: I am ready to go home. I miss my friends, my animals!  No chest pain or shortness of breath. He denies any palpitations.  Filed Vitals:   02/19/11 0226 02/19/11 0428 02/19/11 0816 02/19/11 0844  BP:    110/68  Pulse: 82   110  Temp:    96.4 F (35.8 C)  TempSrc:    Axillary  Resp:    20  Height:      Weight:  91.445 kg (201 lb 9.6 oz)    SpO2:   96% 94%    Intake/Output Summary (Last 24 hours) at 02/19/11 0939 Last data filed at 02/19/11 0700  Gross per 24 hour  Intake    634 ml  Output      0 ml  Net    634 ml    LABS: Basic Metabolic Panel:  Basename 02/19/11 0500 02/19/11 0450 02/18/11 0401  NA -- 137 136  K -- 3.8 3.9  CL -- 96 99  CO2 -- 31 30  GLUCOSE -- 148* 153*  BUN -- 16 15  CREATININE -- 0.94 0.89  CALCIUM -- 10.2 9.7  MG 1.3* -- --  PHOS -- -- --   Liver Function Tests:  Basename 02/18/11 0401 02/17/11 0415  AST 18 13  ALT 13 11  ALKPHOS 54 58  BILITOT 0.3 0.5  PROT 6.3 6.1  ALBUMIN 3.4* 3.3*   No results found for this basename: LIPASE:2,AMYLASE:2 in the last 72 hours CBC:  Basename 02/19/11 0450 02/18/11 0401  WBC 9.0 7.6  NEUTROABS -- --  HGB 15.1 13.5  HCT 45.4 40.6  MCV 98.3 99.0  PLT 177 163   Cardiac Enzymes: No results found for this basename: CKTOTAL:3,CKMB:3,CKMBINDEX:3,TROPONINI:3 in the last 72 hours BNP:  Basename 02/19/11 0451  POCBNP 1521.0*   D-Dimer: No results found for this basename: DDIMER:2 in the last 72 hours Hemoglobin A1C: No results found for this basename: HGBA1C in the last 72 hours Fasting Lipid Panel: No results found for this basename: CHOL,HDL,LDLCALC,TRIG,CHOLHDL,LDLDIRECT in the last 72 hours Thyroid Function Tests: No results found for this basename: TSH,T4TOTAL,FREET3,T3FREE,THYROIDAB in the last 72 hours Anemia Panel: No results found for this basename: VITAMINB12,FOLATE,FERRITIN,TIBC,IRON,RETICCTPCT in the last 72 hours  RADIOLOGY: Dg Chest 2 View  02/15/2011  *RADIOLOGY  REPORT*  Clinical Data: Congestive heart failure  CHEST - 2 VIEW  Comparison: Chest radiograph 08/10/2008  Findings: Cardiac silhouette is similar to prior.  There is a left and right mild basilar atelectasis.  There are small bilateral pleural effusions which are increased compared to prior.  No evidence of pulmonary edema.  No pneumothorax peri  IMPRESSION: 1 .  Basilar atelectasis and small effusions.  2.  Cardiomegaly.  Original Report Authenticated By: Genevive Bi, M.D.    PHYSICAL EXAM General: Well developed, well nourished, in no acute distress, obese Head: Eyes PERRLA, No xanthomas.   Normal cephalic and atramatic  Lungs: Clear bilaterally to auscultation and percussion. Heart: U. Regular rate and rhythm, rate about 100-110 beats per minute S1 S2, with soft S4 murmur.  Pulses are 2+ & equal.            No carotid bruit. No JVD.  No abdominal bruits. No femoral bruits. Abdomen: Bowel sounds are positive, abdomen soft and non-tender without masses or                  Hernia's noted. Msk:  Back normal, normal gait. Normal strength and tone for age. Extremities: No clubbing, cyanosis ,  1+ pretibial edema.  DP +1 Neuro: Alert and oriented X 3. Psych:  Good affect, responds appropriately  TELEMETRY: Reviewed telemetry pt in Atrial fibrillation with a rate in the 100-110 range.  ASSESSMENT AND PLAN:  Active Problems:  CARDIOMYOPATHY, ISCHEMIC  Atrial fibrillation with RVR  His heart rate is still not well controlled. He was loaded with digoxin yesterday and receiving 0.25 mg this morning. It is carvedilol is also increased. Her continue on Lasix 40 mg twice a day. I suspect he will go home tomorrow. Continue overlapping Lovenox and Coumadin. We will see in the morning.  Valera Castle, MD 02/19/2011 9:39 AM

## 2011-02-19 NOTE — Progress Notes (Signed)
[id number] 

## 2011-02-19 NOTE — Progress Notes (Signed)
NAMEJACORION, [name]                  ACCOUNT NO.:  [id number]  MEDICAL RECORD NO.:  [id number]  LOCATION:  A320                          FACILITY:  [facility]  PHYSICIAN:  Melvyn Novas, MDDATE OF BIRTH:  1935-02-09  DATE OF PROCEDURE: DATE OF DISCHARGE:                                PROGRESS NOTE   Problems are AFib with rapid ventricular response, ischemic cardiomyopathy with severely depressed LV function due to anterior wall anteroapical MI in 1998, coronary artery disease, status post stenting of the LAD in 1998 and PTCA of circumflex in 2004 and subsequent stenting of RCA.  The patient has less dyspnea and no orthopnea, PND. No anginal chest pain.  Cardiac enzymes serially were negative.  Lungs showed diminished breath sounds at the bases, prolonged expiratory phase, scattered rhonchi and no rales, no wheeze appreciable.  Heart irregular regular and no S3 appreciated.  Blood pressure is 112/77, temperature 97.4, pulse is 82 and regular and respiratory rate is 18. INR is 1.73 currently on Lovenox full dose as well as Coumadin titrated by pharmacy.  PLAN:  Right now is to continue Coumadin until therapeutic.  Lasix increased to 40 p.o. b.i.d.  We will have to monitor her potassium and magnesium and consider possible cardiac catheterizations as per Dr. Dietrich Pates.  She deems clinically necessary, increase carvedilol to 12.5 p.o. b.i.d. continue Cardizem.  Monitor diabetes and lipids as well as sleep apnea.  We will maintain the patient on hospice 1 more day with BMET in a.m.     Melvyn Novas, MD     RMD/MEDQ  D:  02/19/2011  T:  02/19/2011  Job:  [id number]

## 2011-02-19 NOTE — Consult Note (Signed)
ANTICOAGULATION CONSULT NOTE -Consult  Pharmacy Consult for Lovenox and Warfarin Indication: atrial fibrillation  Allergies  Allergen Reactions  . Novocain    Patient Measurements: Height: 5\' 4"  (162.6 cm) Weight: 201 lb 9.6 oz (91.445 kg) IBW/kg (Calculated) : 59.2   Vital Signs: Temp: 96.4 F (35.8 C) (11/13 0844) Temp src: Axillary (11/13 0844) BP: 110/68 mmHg (11/13 0844) Pulse Rate: 110  (11/13 0844)  Labs:  Basename 02/19/11 0450 02/18/11 0401 02/17/11 0415  HGB 15.1 13.5 --  HCT 45.4 40.6 40.9  PLT 177 163 183  APTT -- -- --  LABPROT 20.6* 17.1* 15.2  INR 1.73* 1.37 1.18  HEPARINUNFRC -- -- --  CREATININE 0.94 0.89 0.91  CKTOTAL -- -- --  CKMB -- -- --  TROPONINI -- -- --   Estimated Creatinine Clearance: 70.3 ml/min (by C-G formula based on Cr of 0.94).  Medical History: Past Medical History  Diagnosis Date  . Myocardial infarct   . Diabetes mellitus   . Dysrhythmia     afib  . Coronary artery disease   . Shortness of breath   . COPD (chronic obstructive pulmonary disease)   . Gout   . DJD (degenerative joint disease)    Medications:  Scheduled:     . aspirin EC  81 mg Oral Daily  . carvedilol  12.5 mg Oral BID WC  . digoxin  0.5 mg Intravenous Once   Followed by  . digoxin  0.25 mg Intravenous Once   Followed by  . digoxin  0.25 mg Intravenous Once  . digoxin  0.25 mg Oral Daily  . diltiazem  180 mg Oral Daily  . enoxaparin (LOVENOX) injection  1 mg/kg Subcutaneous Q12H  . furosemide  40 mg Oral BID  . lisinopril  2.5 mg Oral Daily  . metFORMIN  500 mg Oral BID WC  . potassium chloride  20 mEq Oral BID  . pravastatin  40 mg Oral Daily  . sodium chloride      . spironolactone  25 mg Oral Daily  . Tamsulosin HCl  0.4 mg Oral Daily  . warfarin  10 mg Oral ONCE-1800  . warfarin  7.5 mg Oral ONCE-1800  . DISCONTD: carvedilol  6.25 mg Oral BID WC  . DISCONTD: furosemide  40 mg Intravenous Daily  . DISCONTD: furosemide  40 mg Intravenous  Once   Assessment: Renal function parameters are within normal limits and stable. INR subtherapeutic but approaching goal  Goal of Therapy: Full anticoagulation with Lovenox INR 2-3   Plan: Coumadin 7.5mg  today. Lovenox 1mg /kg sq q12hrs. CBC per protocol Patient and family educated.  [name] A 02/19/2011,9:01 AM

## 2011-02-19 NOTE — Progress Notes (Signed)
UR Chart Review Completed  

## 2011-02-20 LAB — CBC
MCV: 97.3 fL (ref 78.0–100.0)
Platelets: 173 10*3/uL (ref 150–400)
RBC: 4.44 MIL/uL (ref 4.22–5.81)
WBC: 9 10*3/uL (ref 4.0–10.5)

## 2011-02-20 MED ORDER — CARVEDILOL 12.5 MG PO TABS: 12.5000 mg | ORAL_TABLET | Freq: Two times a day (BID) | ORAL | Status: DC

## 2011-02-20 MED ORDER — TAMSULOSIN HCL 0.4 MG PO CAPS: 0.4000 mg | ORAL_CAPSULE | Freq: Every day | ORAL | Status: DC

## 2011-02-20 MED ORDER — SPIRONOLACTONE 25 MG PO TABS: 25.0000 mg | ORAL_TABLET | Freq: Every day | ORAL | Status: DC

## 2011-02-20 MED ORDER — PRAVASTATIN SODIUM 40 MG PO TABS: 40.0000 mg | ORAL_TABLET | Freq: Every day | ORAL | Status: DC

## 2011-02-20 MED ORDER — DILTIAZEM HCL ER COATED BEADS 180 MG PO CP24: 180.0000 mg | ORAL_CAPSULE | Freq: Every day | ORAL | Status: DC

## 2011-02-20 MED ORDER — ACETAMINOPHEN 325 MG PO TABS
650.0000 mg | ORAL_TABLET | Freq: Four times a day (QID) | ORAL | Status: DC | PRN
  Filled 2011-02-20: qty 2

## 2011-02-20 MED ORDER — WARFARIN SODIUM 5 MG PO TABS: 5.0000 mg | ORAL_TABLET | Freq: Every day | ORAL | Status: DC

## 2011-02-20 MED ORDER — FUROSEMIDE 40 MG PO TABS: 40.0000 mg | ORAL_TABLET | Freq: Two times a day (BID) | ORAL | Status: DC

## 2011-02-20 MED ORDER — POTASSIUM CHLORIDE CRYS ER 20 MEQ PO TBCR: 20.0000 meq | EXTENDED_RELEASE_TABLET | Freq: Two times a day (BID) | ORAL | Status: DC

## 2011-02-20 MED ORDER — DIGOXIN 250 MCG PO TABS: 0.2500 mg | ORAL_TABLET | Freq: Every day | ORAL | Status: DC

## 2011-02-20 NOTE — Discharge Summary (Signed)
NAMEANTIONO, ETTINGER                  ACCOUNT NO.:  [id number]  MEDICAL RECORD NO.:  [id number]  LOCATION:  A320                          FACILITY:  [facility]  PHYSICIAN:  Melvyn Novas, MDDATE OF BIRTH:  January 17, 1935  DATE OF ADMISSION:  02/15/2011 DATE OF DISCHARGE:  LH                              DISCHARGE SUMMARY   The patient has ischemic cardiomyopathy with severe LV dysfunction from MI in 1998 with severe anteroapical akinesis.  Also had stenting of his RCA and circumflex artery subsequently.  Has hypertension, hyperlipidemia, and diabetes.  The patient was admitted to hospital with increasing dyspnea over 48 hours preceding admission, found to be in AFib with a rapid ventricular response.  He came in and his rate was lowered, was given IV and then p.o.  Cardizem with the addition of digoxin and continuation of Lopressor.  His cardiac enzymes were negative.  He is hemodynamically stable.  His symptoms resolved over the ensuing 24-36 hours.  He was kept in the hospital, placed on Lovenox full dose anticoagulation, and then added Coumadin.  His Coumadin was therapeutic on the day of admission with an INR of 2.87.  His renal function was normal.  Hemoglobin was within normal parameters and blood pressure was 111/74.  The patient will be discharged on the following medicines: 1. Carvedilol 12.5 mg p.o. b.i.d. 2. Digoxin 0.25 mg p.o. daily. 3. Diltiazem CD 180 p.o. daily. 4. Lasix 40 mg p.o. b.i.d. 5. KCl 20 mEq p.o. b.i.d. 6. Pravachol 40 mg p.o. daily. 7. Spironolactone 25 mg p.o. daily. 8. Flomax 0.4 mg p.o. daily. 9. Coumadin 5 mg p.o. daily.  He will follow up my office in 3 days' time for checking the PT and INR.     Melvyn Novas, MD     RMD/MEDQ  D:  02/20/2011  T:  02/20/2011  Job:  [id number]

## 2011-02-20 NOTE — Progress Notes (Signed)
Pt discharged home via family; Pt and family given and explained all discharge instructions, carenotes, and prescriptions; pt and family stated understanding and denied questions/concerns; all f/u appointments in place; IV removed without complicaitons; pt stable at time of discharge  

## 2011-03-06 ENCOUNTER — Encounter: Payer: Self-pay | Admitting: Adult Health

## 2011-03-06 ENCOUNTER — Other Ambulatory Visit: Payer: Self-pay | Admitting: *Deleted

## 2011-03-06 ENCOUNTER — Ambulatory Visit (INDEPENDENT_AMBULATORY_CARE_PROVIDER_SITE_OTHER): Payer: Medicare Other | Admitting: Adult Health

## 2011-03-06 DIAGNOSIS — I4891 Unspecified atrial fibrillation: Secondary | ICD-10-CM

## 2011-03-06 DIAGNOSIS — I10 Essential (primary) hypertension: Secondary | ICD-10-CM

## 2011-03-06 DIAGNOSIS — I2589 Other forms of chronic ischemic heart disease: Secondary | ICD-10-CM

## 2011-03-06 MED ORDER — FUROSEMIDE 40 MG PO TABS: 40.0000 mg | ORAL_TABLET | Freq: Two times a day (BID) | ORAL | Status: DC

## 2011-03-06 NOTE — Patient Instructions (Signed)
Your physician recommends that you schedule a follow-up appointment in: 2 weeks  Your physician has recommended you make the following change in your medication:  RESTART LASIX (furosemide) - fluid pill - 40 mg daily  Low Salt Diet (See instructions)  Your physician recommends that you return for lab work in: 1 week (BMET)  Keep track of your weight daily and return record with you at your follow up appointment

## 2011-03-06 NOTE — Assessment & Plan Note (Signed)
Heart rate is well controlled at present on Digoxin and Coreg.  No changes in medications.  He follows Dr. Delbert Harness for PT/INR.

## 2011-03-06 NOTE — Progress Notes (Signed)
HPI: [name] is a 76 y/o patient of Dr. Dietrich [name] we are seeing on hospital follow-up after consultation for atrial fib and systolic CHF.  He has a history of CAD with large anterior wall Mi in 4/98 with stent to LAD, PCI of Cx i and PCI of RCA in 2004. He has significantly reduced systolic fx, but most recent echo was not able to calculate. Last echo in May 2010 reported EF of 30-40%.  He was appropriately diuresed during hospitalization and also placed on coumadin.  He is followed by Dr. Delbert [name] for PT/INR.  On follow-up with Dr. Delbert [name], he was taken completely off of lasix.  He had been on 40 mg BID.  He has since gained approx 6 lbs. He admits to dietary noncompliance over Thanksgiving holiday.  He denies chest pain, or significant shortness of breath.   Allergies  Allergen Reactions  . Novocain     Current Outpatient Prescriptions  Medication Sig Dispense Refill  . carvedilol (COREG) 12.5 MG tablet Take 1 tablet (12.5 mg total) by mouth 2 (two) times daily with a meal.  60 tablet  5  . digoxin (LANOXIN) 0.25 MG tablet Take 1 tablet (0.25 mg total) by mouth daily.  30 tablet  3  . diltiazem (CARDIZEM CD) 180 MG 24 hr capsule Take 1 capsule (180 mg total) by mouth daily.  30 capsule  5  . metFORMIN (GLUCOPHAGE) 500 MG tablet Take 500 mg by mouth 2 (two) times daily with a meal.        . Omega-3 Fatty Acids (FISH OIL BURP-LESS) 1000 MG CAPS Take 1,000 mg by mouth daily.        . pravastatin (PRAVACHOL) 40 MG tablet Take 1 tablet (40 mg total) by mouth daily.  30 tablet  3  . spironolactone (ALDACTONE) 25 MG tablet Take 1 tablet (25 mg total) by mouth daily.  30 tablet  3  . Tamsulosin HCl (FLOMAX) 0.4 MG CAPS Take 1 capsule (0.4 mg total) by mouth daily.  30 capsule  3  . vitamin E 400 UNIT capsule Take 400 Units by mouth daily.        Marland Kitchen warfarin (COUMADIN) 5 MG tablet Take 1 tablet (5 mg total) by mouth daily.  30 tablet  2    Past Medical History  Diagnosis Date  . Myocardial  infarct   . Diabetes mellitus   . Dysrhythmia     afib  . Coronary artery disease   . Shortness of breath   . COPD (chronic obstructive pulmonary disease)   . Gout   . DJD (degenerative joint disease)     Past Surgical History  Procedure Date  . Cholecystectomy   . Knee surgery   . Vasectomy     ZOX:WRUEAV of systems complete and found to be negative unless listed above PHYSICAL EXAM BP 112/76  Pulse 85  Ht 5\' 5"  (1.651 m)  Wt 201 lb (91.173 kg)  BMI 33.45 kg/m2  SpO2 96%  General: Well developed, well nourished, in no acute distress Head: Eyes PERRLA, No xanthomas.   Normal cephalic and atramatic Neck obese. Lungs: Clear bilaterally to auscultation and percussion. Heart: HRIR S1 S2, soft 1/6 systolic murmur.  Pulses are 2+ & equal.            No carotid bruit. No JVD.  No abdominal bruits. No femoral bruits. Abdomen: Bowel sounds are positive, abdomen soft, mild distention, and non-tender without masses or  Hernia's noted. Msk:  Back normal, normal gait. Normal strength and tone for age. Extremities: No clubbing, cyanosis 1+ edema ankles, nonpitting edema pretibial .  DP +1 Neuro: Alert and oriented X 3. Psych:  Good affect, responds appropriately  ZOX:WRUEAV fibrillation with flutter waves as well. Rate of 87 bpm.Lateral ST abnormal.  ASSESSMENT AND PLAN

## 2011-03-06 NOTE — Assessment & Plan Note (Signed)
He has gained approx 6 lbs since being taken off of lasix by Dr. Delbert Harness. With systolic dysfx and atrial fib, will place him back on lasix 40 mg once daily. He is advised to weigh himself daily on a digital scale, given written instructions on a low salt diet.  He will have a BMET in one week and follow with [place] in 2 weeks. I would like his weight to be 197 lbs as baseline.

## 2011-03-12 ENCOUNTER — Encounter: Payer: Self-pay | Admitting: Adult Health

## 2011-03-14 ENCOUNTER — Encounter: Payer: Self-pay | Admitting: Adult Health

## 2011-03-19 ENCOUNTER — Encounter: Payer: Self-pay | Admitting: *Deleted

## 2011-03-19 ENCOUNTER — Other Ambulatory Visit: Payer: Self-pay | Admitting: *Deleted

## 2011-03-19 ENCOUNTER — Encounter: Payer: Self-pay | Admitting: Adult Health

## 2011-03-19 ENCOUNTER — Ambulatory Visit (INDEPENDENT_AMBULATORY_CARE_PROVIDER_SITE_OTHER): Payer: Medicare Other | Admitting: Adult Health

## 2011-03-19 DIAGNOSIS — I10 Essential (primary) hypertension: Secondary | ICD-10-CM

## 2011-03-19 DIAGNOSIS — I2589 Other forms of chronic ischemic heart disease: Secondary | ICD-10-CM

## 2011-03-19 DIAGNOSIS — I4891 Unspecified atrial fibrillation: Secondary | ICD-10-CM

## 2011-03-19 NOTE — Assessment & Plan Note (Signed)
He is doing well. Lungs are clear, weight is down. I have reviewed recent labs. K+ 4.4, Creatinine 1.41.  Mg+ low at 1.2.  Will add magnesium 400 mg BID to medication regimen. Will repeat his labs in one month. He has been having some complaints of constipation. Magnesium should help with this.  If not, he is advised to take on occasional stool softner. He is advised to continue daily wts and salt restriction. Will see him in 6 weeks. Plan on repeating echo soon.

## 2011-03-19 NOTE — Patient Instructions (Signed)
Your physician recommends that you schedule a follow-up appointment in: 6 weeks  Your physician recommends that you return for lab work in: 1 month   Your physician has recommended you make the following change in your medication: Start Magnesium 400 mg twice a day

## 2011-03-19 NOTE — Progress Notes (Signed)
HPI: [name] is a pleasant, talkative 76 y/o patient of Dr. Dietrich Pates we are following for atrial fib and systolic dysfunction, CAD with large anterior wall MI in 4/98 and stent to the LAD, PCI of the CX with later PCI of the RCA in 2004. Miost recent echo in November was unable to calculate EF, but reported as significantly reduced. Prior echo had his EF of 30-40%. He is doing well and is following a low salt diet. He has lost an additional 7 lbs since being seen last and is weighing himself everyday. He denies shortness of breath or dizziness.  He is medically complaint.  Allergies  Allergen Reactions  . Novocain     Current Outpatient Prescriptions  Medication Sig Dispense Refill  . carvedilol (COREG) 12.5 MG tablet Take 1 tablet (12.5 mg total) by mouth 2 (two) times daily with a meal.  60 tablet  5  . digoxin (LANOXIN) 0.25 MG tablet Take 1 tablet (0.25 mg total) by mouth daily.  30 tablet  3  . diltiazem (CARDIZEM CD) 180 MG 24 hr capsule Take 1 capsule (180 mg total) by mouth daily.  30 capsule  5  . furosemide (LASIX) 40 MG tablet Take 40 mg by mouth daily.        . magnesium oxide (MAG-OX) 400 MG tablet Take 400 mg by mouth 2 (two) times daily.        . metFORMIN (GLUCOPHAGE) 500 MG tablet Take 500 mg by mouth 2 (two) times daily with a meal.        . Omega-3 Fatty Acids (FISH OIL BURP-LESS) 1000 MG CAPS Take 1,000 mg by mouth daily.        . pravastatin (PRAVACHOL) 40 MG tablet Take 1 tablet (40 mg total) by mouth daily.  30 tablet  3  . spironolactone (ALDACTONE) 25 MG tablet Take 1 tablet (25 mg total) by mouth daily.  30 tablet  3  . Tamsulosin HCl (FLOMAX) 0.4 MG CAPS Take 1 capsule (0.4 mg total) by mouth daily.  30 capsule  3  . vitamin E 400 UNIT capsule Take 400 Units by mouth daily.        Marland Kitchen warfarin (COUMADIN) 5 MG tablet Take 1 tablet (5 mg total) by mouth daily.  30 tablet  2    Past Medical History  Diagnosis Date  . Myocardial infarct   . Diabetes mellitus   .  Dysrhythmia     afib  . Coronary artery disease   . Shortness of breath   . COPD (chronic obstructive pulmonary disease)   . Gout   . DJD (degenerative joint disease)     Past Surgical History  Procedure Date  . Cholecystectomy   . Knee surgery   . Vasectomy     EAV:WUJWJX of systems complete and found to be negative unless listed above PHYSICAL EXAM BP 108/62  Pulse 64  Ht 5\' 4"  (1.626 m)  Wt 194 lb (87.998 kg)  BMI 33.30 kg/m2  General: Well developed, well nourished, in no acute distress Head: Eyes PERRLA, No xanthomas.   Normal cephalic and atramatic  Lungs: Clear bilaterally to auscultation and percussion. Heart: Irregular rhythm with soft 1/6 systolic murmur, without .  Pulses are 2+ & equal.            No carotid bruit. No JVD.  No abdominal bruits. No femoral bruits. Abdomen: Bowel sounds are positive, abdomen soft and non-tender without masses or  Hernia's noted. Msk:  Back normal, normal gait. Normal strength and tone for age. Extremities: No clubbing, cyanosis or edema.  DP +1 Neuro: Alert and oriented X 3. Psych:  Good affect, responds appropriately   ASSESSMENT AND PLAN

## 2011-03-19 NOTE — Assessment & Plan Note (Signed)
Heart rate is well controlled on digoxin and carvedilol. He is asymptomatic. He is followed by Dr. Delbert Harness for INR checks.

## 2011-04-07 ENCOUNTER — Emergency Department (HOSPITAL_COMMUNITY)
Admission: EM | Admit: 2011-04-07 | Discharge: 2011-04-07 | Disposition: A | Discharge status: Home / Self Care | Payer: Medicare Other | Attending: Emergency Medicine | Admitting: Emergency Medicine

## 2011-04-07 ENCOUNTER — Emergency Department (HOSPITAL_COMMUNITY): Payer: Medicare Other

## 2011-04-07 ENCOUNTER — Encounter (HOSPITAL_COMMUNITY): Payer: Self-pay | Admitting: Emergency Medicine

## 2011-04-07 DIAGNOSIS — J4489 Other specified chronic obstructive pulmonary disease: Secondary | ICD-10-CM | POA: Insufficient documentation

## 2011-04-07 DIAGNOSIS — J449 Chronic obstructive pulmonary disease, unspecified: Secondary | ICD-10-CM | POA: Insufficient documentation

## 2011-04-07 DIAGNOSIS — R197 Diarrhea, unspecified: Secondary | ICD-10-CM | POA: Insufficient documentation

## 2011-04-07 DIAGNOSIS — I252 Old myocardial infarction: Secondary | ICD-10-CM | POA: Insufficient documentation

## 2011-04-07 DIAGNOSIS — J069 Acute upper respiratory infection, unspecified: Secondary | ICD-10-CM | POA: Insufficient documentation

## 2011-04-07 DIAGNOSIS — N390 Urinary tract infection, site not specified: Secondary | ICD-10-CM | POA: Insufficient documentation

## 2011-04-07 DIAGNOSIS — I2510 Atherosclerotic heart disease of native coronary artery without angina pectoris: Secondary | ICD-10-CM | POA: Insufficient documentation

## 2011-04-07 DIAGNOSIS — Z79899 Other long term (current) drug therapy: Secondary | ICD-10-CM | POA: Insufficient documentation

## 2011-04-07 DIAGNOSIS — E119 Type 2 diabetes mellitus without complications: Secondary | ICD-10-CM | POA: Insufficient documentation

## 2011-04-07 LAB — COMPREHENSIVE METABOLIC PANEL
BUN: 15 mg/dL (ref 6–23)
CO2: 25 mEq/L (ref 19–32)
Chloride: 95 mEq/L — ABNORMAL LOW (ref 96–112)
Creatinine, Ser: 1.09 mg/dL (ref 0.50–1.35)
GFR calc Af Amer: 75 mL/min — ABNORMAL LOW (ref >90)
GFR calc non Af Amer: 65 mL/min — ABNORMAL LOW (ref >90)
Glucose, Bld: 203 mg/dL — ABNORMAL HIGH (ref 70–99)
Total Bilirubin: 0.6 mg/dL (ref 0.3–1.2)

## 2011-04-07 LAB — DIFFERENTIAL
Basophils Relative: 0 % (ref 0–1)
Lymphocytes Relative: 24 % (ref 12–46)
Monocytes Absolute: 1 10*3/uL (ref 0.1–1.0)
Monocytes Relative: 11 % (ref 3–12)
Neutro Abs: 6.3 10*3/uL (ref 1.7–7.7)

## 2011-04-07 LAB — URINALYSIS, ROUTINE W REFLEX MICROSCOPIC
Leukocytes, UA: NEGATIVE
Protein, ur: 30 mg/dL — AB
Urobilinogen, UA: 0.2 mg/dL (ref 0.0–1.0)

## 2011-04-07 LAB — CBC
HCT: 50 % (ref 39.0–52.0)
Hemoglobin: 17.3 g/dL — ABNORMAL HIGH (ref 13.0–17.0)
MCHC: 34.6 g/dL (ref 30.0–36.0)

## 2011-04-07 LAB — PROTIME-INR: Prothrombin Time: 17.2 seconds — ABNORMAL HIGH (ref 11.6–15.2)

## 2011-04-07 LAB — GLUCOSE, CAPILLARY: Glucose-Capillary: 212 mg/dL — ABNORMAL HIGH (ref 70–99)

## 2011-04-07 MED ORDER — DEXTROSE 5 % IV SOLN
INTRAVENOUS | Status: AC
  Filled 2011-04-07: qty 10

## 2011-04-07 MED ORDER — HYDROCODONE-ACETAMINOPHEN 5-325 MG PO TABS
1.0000 | ORAL_TABLET | Freq: Once | ORAL | Status: AC
  Administered 2011-04-07: 1 via ORAL
  Filled 2011-04-07: qty 1

## 2011-04-07 MED ORDER — LEVOFLOXACIN 500 MG PO TABS: 500.0000 mg | ORAL_TABLET | Freq: Every day | ORAL | Status: AC

## 2011-04-07 MED ORDER — DEXTROSE 5 % IV SOLN
1.0000 g | Freq: Once | INTRAVENOUS | Status: AC
  Administered 2011-04-07: 1 g via INTRAVENOUS
  Filled 2011-04-07: qty 10

## 2011-04-07 MED ORDER — SODIUM CHLORIDE 0.9 % IV BOLUS (SEPSIS): 1000.0000 mL | Freq: Once | INTRAVENOUS | Status: DC

## 2011-04-07 MED ORDER — SODIUM CHLORIDE 0.9 % IV BOLUS (SEPSIS)
1000.0000 mL | Freq: Once | INTRAVENOUS | Status: AC
  Administered 2011-04-07: 1000 mL via INTRAVENOUS

## 2011-04-07 NOTE — ED Notes (Signed)
Family at bedside. Pt resting 

## 2011-04-07 NOTE — ED Notes (Signed)
Received report and agree with previous assessment

## 2011-04-07 NOTE — ED Notes (Addendum)
Patient c/o nausea, vomiting, diarrhea, unable to swallow, weakness, dizziness, urinary incontinence, and fevers. Fever was 101.7 x2 days ago per patient. Patient reports taking tylenol at 1100 this am. Patient also taking mucinex every 4 hours.

## 2011-04-07 NOTE — ED Provider Notes (Signed)
History     CSN: [id number]  Arrival date & time 04/07/11  1500   First MD Initiated Contact with Patient 04/07/11 1632      Chief Complaint  Patient presents with  . Nausea  . Emesis  . Urinary Incontinence  . Diarrhea    (Consider location/radiation/quality/duration/timing/severity/associated sxs/prior treatment) Patient is a 76 y.o. male presenting with vomiting and diarrhea. The history is provided by the patient (The patient complains of weakness nausea vomiting diarrhea and cough).  Emesis  This is a new problem. The current episode started yesterday. The problem occurs 2 to 4 times per day. The problem has not changed since onset.The emesis has an appearance of stomach contents. Associated symptoms include diarrhea. Pertinent negatives include no abdominal pain, no cough and no headaches.  Diarrhea The primary symptoms include vomiting and diarrhea. Primary symptoms do not include fatigue, abdominal pain or rash.  The illness does not include back pain.    Past Medical History  Diagnosis Date  . Myocardial infarct   . Diabetes mellitus   . Dysrhythmia     afib  . Coronary artery disease   . Shortness of breath   . COPD (chronic obstructive pulmonary disease)   . Gout   . DJD (degenerative joint disease)     Past Surgical History  Procedure Date  . Cholecystectomy   . Knee surgery   . Vasectomy   . Coronary angioplasty with stent placement     x3    Family History  Problem Relation Age of Onset  . Heart failure Mother   . Heart failure Father     History  Substance Use Topics  . Smoking status: Former Smoker -- 1.0 packs/day for 40 years    Types: Cigarettes    Quit date: 04/08/1992  . Smokeless tobacco: Never Used  . Alcohol Use: No      Review of Systems  Constitutional: Negative for fatigue.  HENT: Negative for congestion, sinus pressure and ear discharge.   Eyes: Negative for discharge.  Respiratory: Negative for cough.     Cardiovascular: Negative for chest pain.  Gastrointestinal: Positive for vomiting and diarrhea. Negative for abdominal pain.  Genitourinary: Negative for frequency and hematuria.  Musculoskeletal: Negative for back pain.  Skin: Negative for rash.  Neurological: Negative for seizures and headaches.  Hematological: Negative.   Psychiatric/Behavioral: Negative for hallucinations.    Allergies  Novocain  Home Medications   Current Outpatient Rx  Name Route Sig Dispense Refill  . CARVEDILOL 12.5 MG PO TABS Oral Take 1 tablet (12.5 mg total) by mouth 2 (two) times daily with a meal. 60 tablet 5  . DIGOXIN 0.25 MG PO TABS Oral Take 1 tablet (0.25 mg total) by mouth daily. 30 tablet 3  . DILTIAZEM HCL ER COATED BEADS 180 MG PO CP24 Oral Take 1 capsule (180 mg total) by mouth daily. 30 capsule 5  . FUROSEMIDE 40 MG PO TABS Oral Take 40 mg by mouth daily.      Marland Kitchen MAGNESIUM OXIDE 400 MG PO TABS Oral Take 400 mg by mouth 2 (two) times daily.      Marland Kitchen METFORMIN HCL 500 MG PO TABS Oral Take 500 mg by mouth 2 (two) times daily with a meal.      . FISH OIL BURP-LESS 1000 MG PO CAPS Oral Take 1,000 mg by mouth daily.      Marland Kitchen PRAVASTATIN SODIUM 40 MG PO TABS Oral Take 1 tablet (40 mg total) by mouth daily.  30 tablet 3  . SPIRONOLACTONE 25 MG PO TABS Oral Take 1 tablet (25 mg total) by mouth daily. 30 tablet 3  . TAMSULOSIN HCL 0.4 MG PO CAPS Oral Take 1 capsule (0.4 mg total) by mouth daily. 30 capsule 3  . VITAMIN E 400 UNITS PO CAPS Oral Take 400 Units by mouth daily.      . WARFARIN SODIUM 5 MG PO TABS Oral Take 5 mg by mouth daily. Patient takes 1 tablet 1 day the 1/2 tablet for the next 2 days then repeat cycle.... Today 04/07/11 Patient took 1/2 tablet this morning...     . LEVOFLOXACIN 500 MG PO TABS Oral Take 1 tablet (500 mg total) by mouth daily. 10 tablet 0    BP 100/74  Pulse 63  Temp(Src) 98.1 F (36.7 C) (Oral)  Resp 20  Ht 5\' 5"  (1.651 m)  Wt 185 lb (83.915 kg)  BMI 30.79 kg/m2   SpO2 95%  Physical Exam  Constitutional: He is oriented to person, place, and time. He appears well-developed.  HENT:  Head: Normocephalic and atraumatic.  Eyes: Conjunctivae and EOM are normal. No scleral icterus.  Neck: Neck supple. No thyromegaly present.  Cardiovascular: Normal rate and regular rhythm.  Exam reveals no gallop and no friction rub.   No murmur heard. Pulmonary/Chest: No stridor. He has no wheezes. He has no rales. He exhibits no tenderness.  Abdominal: He exhibits no distension. There is no tenderness. There is no rebound.  Musculoskeletal: Normal range of motion. He exhibits no edema.  Lymphadenopathy:    He has no cervical adenopathy.  Neurological: He is oriented to person, place, and time. Coordination normal.  Skin: No rash noted. No erythema.  Psychiatric: He has a normal mood and affect. His behavior is normal.    ED Course  Procedures (including critical care time)  Labs Reviewed  GLUCOSE, CAPILLARY - Abnormal; Notable for the following:    Glucose-Capillary 212 (*)    All other components within normal limits  CBC - Abnormal; Notable for the following:    Hemoglobin 17.3 (*)    Platelets 147 (*)    All other components within normal limits  COMPREHENSIVE METABOLIC PANEL - Abnormal; Notable for the following:    Sodium 132 (*)    Chloride 95 (*)    Glucose, Bld 203 (*)    GFR calc non Af Amer 65 (*)    GFR calc Af Amer 75 (*)    All other components within normal limits  URINALYSIS, ROUTINE W REFLEX MICROSCOPIC - Abnormal; Notable for the following:    Color, Urine AMBER (*) BIOCHEMICALS MAY BE AFFECTED BY COLOR   Specific Gravity, Urine >1.030 (*)    Hgb urine dipstick LARGE (*)    Bilirubin Urine MODERATE (*)    Ketones, ur TRACE (*)    Protein, ur 30 (*)    All other components within normal limits  PROTIME-INR - Abnormal; Notable for the following:    Prothrombin Time 17.2 (*)    All other components within normal limits  URINE  MICROSCOPIC-ADD ON - Abnormal; Notable for the following:    Bacteria, UA MANY (*)    Casts HYALINE CASTS (*)    All other components within normal limits  DIFFERENTIAL  POCT CBG MONITORING  URINE CULTURE   Dg Abd Acute W/chest  04/07/2011  *RADIOLOGY REPORT*  Clinical Data: Nausea and vomiting.  ACUTE ABDOMEN SERIES (ABDOMEN 2 VIEW & CHEST 1 VIEW) 04/07/2011:  Comparison: Acute abdomen  series 10/05/2010.  CT abdomen pelvis 08/15/2008.  Two-view chest x-ray 02/15/2011.  Findings: Bowel gas pattern unremarkable without evidence of obstruction or significant ileus.  Expected stool burden.  Surgical clips in the right upper quadrant from prior cholecystectomy.  No evidence of free air or significant air fluid levels on the erect image; air-fluid levels in the colon consistent with liquid stool. Phleboliths in the pelvis.  Calcifications overlying the lower poles of both kidneys.  No visible opaque ureteral calculi. Degenerative changes involving the lower thoracic and lumbar spine.  Cardiac silhouette mildly enlarged but stable.  Thoracic aorta tortuous, unchanged.  Scarring in the left lung base, unchanged. Lungs otherwise clear.  IMPRESSION:  1.  No acute abdominal abnormality. 2.  Bilateral lower pole renal calculi.  No visible opaque ureteral calculus on either side. 3.  Stable mild cardiomegaly and left basilar scarring.  No acute cardiopulmonary disease.  Original Report Authenticated By: Arnell Sieving, M.D.     1. UTI (lower urinary tract infection)   2. URI (upper respiratory infection)      I spoke with his md and he will follow up the pt this week.  Pt felt good at dc and bp was 100/70 standing. MDM          Benny Lennert, MD 04/07/11 [phone]

## 2011-04-09 LAB — URINE CULTURE
Culture  Setup Time: 201212302015
Culture: NO GROWTH

## 2011-04-22 ENCOUNTER — Other Ambulatory Visit: Payer: Self-pay | Admitting: Adult Health

## 2011-04-23 LAB — BASIC METABOLIC PANEL
BUN: 16 mg/dL (ref 6–23)
CO2: 27 mEq/L (ref 19–32)
Calcium: 9.7 mg/dL (ref 8.4–10.5)
Creat: 1.15 mg/dL (ref 0.50–1.35)

## 2011-04-29 ENCOUNTER — Encounter: Payer: Self-pay | Admitting: Cardiology

## 2011-04-29 ENCOUNTER — Ambulatory Visit (INDEPENDENT_AMBULATORY_CARE_PROVIDER_SITE_OTHER): Payer: Medicare Other | Admitting: Cardiology

## 2011-04-29 VITALS — BP 103/73 | HR 64 | Resp 18 | Ht 64.0 in | Wt 195.0 lb

## 2011-04-29 DIAGNOSIS — Z0189 Encounter for other specified special examinations: Secondary | ICD-10-CM

## 2011-04-29 DIAGNOSIS — I2510 Atherosclerotic heart disease of native coronary artery without angina pectoris: Secondary | ICD-10-CM

## 2011-04-29 DIAGNOSIS — E119 Type 2 diabetes mellitus without complications: Secondary | ICD-10-CM

## 2011-04-29 DIAGNOSIS — G4733 Obstructive sleep apnea (adult) (pediatric): Secondary | ICD-10-CM

## 2011-04-29 DIAGNOSIS — J449 Chronic obstructive pulmonary disease, unspecified: Secondary | ICD-10-CM | POA: Insufficient documentation

## 2011-04-29 DIAGNOSIS — I4891 Unspecified atrial fibrillation: Secondary | ICD-10-CM

## 2011-04-29 DIAGNOSIS — Z7901 Long term (current) use of anticoagulants: Secondary | ICD-10-CM

## 2011-04-29 NOTE — Progress Notes (Signed)
Patient ID: [name], male   DOB: 06/03/1934, 77 y.o.   MRN: [id number] HPI: Scheduled return visit for this very nice gentleman with a remote myocardial infarction, ischemic cardiomyopathy and recent onset of atrial fibrillation.  With control of heart rate, all symptoms have resolved.  He remains fairly active, including performing some light resistance exercises daily and donating significant work effort to charity.  He has chronic class II exertional dyspnea, and but denies chest discomfort, orthopnea, PND or ankle edema.  He has a noted no palpitations and has experienced no lightheadedness or syncope.  Patient was seen in the emergency department for an apparent urinary tract infection.  Pyuria was demonstrated, but culture was negative.  He was treated with antibiotics, apparently with resolution.  Prior to Admission medications   Medication Sig Start Date End Date Taking? Authorizing Provider  carvedilol (COREG) 12.5 MG tablet Take 1 tablet (12.5 mg total) by mouth 2 (two) times daily with a meal. 02/20/11 02/20/12 Yes [name], MD  digoxin (LANOXIN) 0.25 MG tablet Take 1 tablet (0.25 mg total) by mouth daily. 02/20/11 02/20/12 Yes [name], MD  diltiazem (CARDIZEM CD) 180 MG 24 hr capsule Take 1 capsule (180 mg total) by mouth daily. 02/20/11 02/20/12 Yes [name], MD  furosemide (LASIX) 40 MG tablet Take 40 mg by mouth daily.     Yes Historical Provider, MD  magnesium oxide (MAG-OX) 400 MG tablet Take 400 mg by mouth 2 (two) times daily.     Yes Historical Provider, MD  metFORMIN (GLUCOPHAGE) 500 MG tablet Take 500 mg by mouth 2 (two) times daily with a meal.     Yes Historical Provider, MD  Omega-3 Fatty Acids (FISH OIL BURP-LESS) 1000 MG CAPS Take 1,000 mg by mouth daily.     Yes Historical Provider, MD  pravastatin (PRAVACHOL) 40 MG tablet Take 1 tablet (40 mg total) by mouth daily. 02/20/11 02/20/12 Yes [name], MD  spironolactone (ALDACTONE) 25  MG tablet Take 1 tablet (25 mg total) by mouth daily. 02/20/11 02/20/12 Yes [name], MD  Tamsulosin HCl (FLOMAX) 0.4 MG CAPS Take 1 capsule (0.4 mg total) by mouth daily. 02/20/11  Yes [name], MD  vitamin E 400 UNIT capsule Take 400 Units by mouth daily.     Yes Historical Provider, MD  warfarin (COUMADIN) 5 MG tablet Take 5 mg by mouth daily. Patient takes 1 tablet 1 day the 1/2 tablet for the next 2 days then repeat cycle.... Today 04/07/11 Patient took 1/2 tablet this morning...  02/20/11 02/20/12 Yes [name], MD    Allergies  Allergen Reactions  . Novocain   Past medical history, social history, and family history reviewed and updated.  ROS: See history of present illness.  PHYSICAL EXAM: BP 103/73  Pulse 64  Resp 18  Ht 5\' 4"  (1.626 m)  Wt 88.451 kg (195 lb)  BMI 33.47 kg/m2  General-Well developed; no acute distress Body habitus-mildly obese Neck-No JVD; no carotid bruits Lungs-clear lung fields; resonant to percussion Cardiovascular-normal PMI; normal S1 and S2; irregular rhythm Abdomen-normal bowel sounds; soft and non-tender without masses or organomegaly; ventral hernia present Musculoskeletal-No deformities, no cyanosis or clubbing Neurologic-Normal cranial nerves; symmetric strength and tone Skin-Warm, no significant lesions Extremities-distal pulses 1-2+; no edema  EKG: Atrial fibrillation with controlled ventricular response; ventricular rate of 79 bpm; incomplete left bundle branch block; nonspecific ST-T wave abnormality; delayed R-wave progression  ASSESSMENT AND PLAN:  [name],  MD 04/29/2011 10:57 AM

## 2011-04-29 NOTE — Patient Instructions (Signed)
Your physician recommends that you schedule a follow-up appointment in: 9 months (you will receive a letter)  Your physician has requested that you have an echocardiogram. Echocardiography is a painless test that uses sound waves to create images of your heart. It provides your doctor with information about the size and shape of your heart and how well your heart's chambers and valves are working. This procedure takes approximately one hour. There are no restrictions for this procedure. In 1 month  Your physician recommends that you return for lab work in: 3 and 6 months (you will receive a letter)

## 2011-04-29 NOTE — Assessment & Plan Note (Signed)
Diabetic control appears adequate.  Dr. Janna Arch is managing this problem.

## 2011-04-29 NOTE — Assessment & Plan Note (Signed)
CBC and FOBT will be monitored to exclude occult GI blood loss.

## 2011-04-29 NOTE — Assessment & Plan Note (Signed)
Patient has no symptoms at present to suggest myocardial ischemia.  He has not required intervention for coronary disease since 2004; however, recent echocardiogram indicated progression of ischemic cardiomyopathy with a decrease in ejection fraction from 35% to 25%.  This may have been related to the onset of atrial fibrillation.  A repeat echocardiogram will be obtained to reassess ejection fraction.  If severe impairment persists, patient will be referred to Dr. Ladona Ridgel for consideration of an AICD.

## 2011-04-29 NOTE — Assessment & Plan Note (Signed)
Patient is asymptomatic with respect to lung disease at present.

## 2011-04-29 NOTE — Assessment & Plan Note (Addendum)
Atrial fibrillation persists, but patient appears to have no symptoms referable to his arrhythmia now that heart rate has been adequately controlled.  He is doing well with chronic anticoagulation, which will be continued.

## 2011-04-30 ENCOUNTER — Encounter: Payer: Self-pay | Admitting: *Deleted

## 2011-05-29 ENCOUNTER — Other Ambulatory Visit: Payer: Self-pay | Admitting: *Deleted

## 2011-05-29 DIAGNOSIS — I10 Essential (primary) hypertension: Secondary | ICD-10-CM

## 2011-05-30 ENCOUNTER — Ambulatory Visit (HOSPITAL_COMMUNITY)
Admission: RE | Admit: 2011-05-30 | Discharge: 2011-05-30 | Disposition: A | Discharge status: Home / Self Care | Payer: Medicare Other | Source: Ambulatory Visit | Attending: Cardiology | Admitting: Cardiology

## 2011-05-30 DIAGNOSIS — J4489 Other specified chronic obstructive pulmonary disease: Secondary | ICD-10-CM | POA: Insufficient documentation

## 2011-05-30 DIAGNOSIS — I4891 Unspecified atrial fibrillation: Secondary | ICD-10-CM | POA: Insufficient documentation

## 2011-05-30 DIAGNOSIS — J449 Chronic obstructive pulmonary disease, unspecified: Secondary | ICD-10-CM | POA: Insufficient documentation

## 2011-05-30 DIAGNOSIS — G4733 Obstructive sleep apnea (adult) (pediatric): Secondary | ICD-10-CM | POA: Insufficient documentation

## 2011-05-30 DIAGNOSIS — E119 Type 2 diabetes mellitus without complications: Secondary | ICD-10-CM | POA: Insufficient documentation

## 2011-05-30 DIAGNOSIS — I059 Rheumatic mitral valve disease, unspecified: Secondary | ICD-10-CM

## 2011-05-30 DIAGNOSIS — I2510 Atherosclerotic heart disease of native coronary artery without angina pectoris: Secondary | ICD-10-CM | POA: Insufficient documentation

## 2011-05-30 NOTE — Progress Notes (Signed)
*  PRELIMINARY RESULTS* Echocardiogram 2D Echocardiogram has been performed.  Conrad Halstead 05/30/2011, 8:54 AM

## 2011-06-03 ENCOUNTER — Encounter: Payer: Self-pay | Admitting: *Deleted

## 2011-06-24 ENCOUNTER — Encounter (HOSPITAL_COMMUNITY): Payer: Self-pay | Admitting: *Deleted

## 2011-06-24 ENCOUNTER — Emergency Department (HOSPITAL_COMMUNITY): Payer: Medicare Other

## 2011-06-24 ENCOUNTER — Inpatient Hospital Stay (HOSPITAL_COMMUNITY)
Admission: EM | Admit: 2011-06-24 | Discharge: 2011-06-29 | DRG: 669 | Disposition: A | Discharge status: Home / Self Care | Payer: Medicare Other | Attending: Family Medicine | Admitting: Family Medicine

## 2011-06-24 DIAGNOSIS — I252 Old myocardial infarction: Secondary | ICD-10-CM

## 2011-06-24 DIAGNOSIS — I4821 Permanent atrial fibrillation: Secondary | ICD-10-CM | POA: Diagnosis present

## 2011-06-24 DIAGNOSIS — E875 Hyperkalemia: Secondary | ICD-10-CM | POA: Diagnosis present

## 2011-06-24 DIAGNOSIS — M109 Gout, unspecified: Secondary | ICD-10-CM | POA: Diagnosis present

## 2011-06-24 DIAGNOSIS — R791 Abnormal coagulation profile: Secondary | ICD-10-CM | POA: Diagnosis present

## 2011-06-24 DIAGNOSIS — I5022 Chronic systolic (congestive) heart failure: Secondary | ICD-10-CM | POA: Diagnosis present

## 2011-06-24 DIAGNOSIS — N200 Calculus of kidney: Secondary | ICD-10-CM | POA: Diagnosis present

## 2011-06-24 DIAGNOSIS — I2510 Atherosclerotic heart disease of native coronary artery without angina pectoris: Secondary | ICD-10-CM | POA: Diagnosis present

## 2011-06-24 DIAGNOSIS — G4733 Obstructive sleep apnea (adult) (pediatric): Secondary | ICD-10-CM | POA: Diagnosis present

## 2011-06-24 DIAGNOSIS — J4489 Other specified chronic obstructive pulmonary disease: Secondary | ICD-10-CM | POA: Diagnosis present

## 2011-06-24 DIAGNOSIS — E119 Type 2 diabetes mellitus without complications: Secondary | ICD-10-CM | POA: Diagnosis present

## 2011-06-24 DIAGNOSIS — R310 Gross hematuria: Secondary | ICD-10-CM | POA: Diagnosis present

## 2011-06-24 DIAGNOSIS — E785 Hyperlipidemia, unspecified: Secondary | ICD-10-CM | POA: Diagnosis present

## 2011-06-24 DIAGNOSIS — I2589 Other forms of chronic ischemic heart disease: Secondary | ICD-10-CM | POA: Diagnosis present

## 2011-06-24 DIAGNOSIS — M1990 Unspecified osteoarthritis, unspecified site: Secondary | ICD-10-CM | POA: Diagnosis present

## 2011-06-24 DIAGNOSIS — Z6832 Body mass index (BMI) 32.0-32.9, adult: Secondary | ICD-10-CM

## 2011-06-24 DIAGNOSIS — Z7901 Long term (current) use of anticoagulants: Secondary | ICD-10-CM

## 2011-06-24 DIAGNOSIS — I509 Heart failure, unspecified: Secondary | ICD-10-CM | POA: Diagnosis present

## 2011-06-24 DIAGNOSIS — N201 Calculus of ureter: Principal | ICD-10-CM | POA: Diagnosis present

## 2011-06-24 DIAGNOSIS — I4891 Unspecified atrial fibrillation: Secondary | ICD-10-CM | POA: Diagnosis present

## 2011-06-24 DIAGNOSIS — J449 Chronic obstructive pulmonary disease, unspecified: Secondary | ICD-10-CM | POA: Diagnosis present

## 2011-06-24 DIAGNOSIS — E669 Obesity, unspecified: Secondary | ICD-10-CM | POA: Diagnosis present

## 2011-06-24 DIAGNOSIS — Z87891 Personal history of nicotine dependence: Secondary | ICD-10-CM

## 2011-06-24 HISTORY — DX: Heart failure, unspecified: I50.9

## 2011-06-24 HISTORY — DX: Acute myocardial infarction, unspecified: I21.9

## 2011-06-24 LAB — CBC
HCT: 46 % (ref 39.0–52.0)
MCH: 33.8 pg (ref 26.0–34.0)
MCHC: 35 g/dL (ref 30.0–36.0)
MCV: 96.6 fL (ref 78.0–100.0)
Platelets: 176 10*3/uL (ref 150–400)
RDW: 13.3 % (ref 11.5–15.5)

## 2011-06-24 LAB — COMPREHENSIVE METABOLIC PANEL
AST: 35 U/L (ref 0–37)
Alkaline Phosphatase: 58 U/L (ref 39–117)
CO2: 24 mEq/L (ref 19–32)
Chloride: 93 mEq/L — ABNORMAL LOW (ref 96–112)
Creatinine, Ser: 0.98 mg/dL (ref 0.50–1.35)
GFR calc non Af Amer: 79 mL/min — ABNORMAL LOW (ref >90)
Potassium: 5.5 mEq/L — ABNORMAL HIGH (ref 3.5–5.1)
Total Bilirubin: 0.9 mg/dL (ref 0.3–1.2)

## 2011-06-24 LAB — URINALYSIS, ROUTINE W REFLEX MICROSCOPIC
Glucose, UA: NEGATIVE mg/dL
Ketones, ur: 40 mg/dL — AB
Protein, ur: 30 mg/dL — AB
Urobilinogen, UA: 0.2 mg/dL (ref 0.0–1.0)

## 2011-06-24 LAB — DIFFERENTIAL
Basophils Absolute: 0 10*3/uL (ref 0.0–0.1)
Eosinophils Absolute: 0 10*3/uL (ref 0.0–0.7)
Eosinophils Relative: 0 % (ref 0–5)
Monocytes Absolute: 0.5 10*3/uL (ref 0.1–1.0)

## 2011-06-24 LAB — URINE MICROSCOPIC-ADD ON

## 2011-06-24 MED ORDER — HYDROMORPHONE HCL PF 1 MG/ML IJ SOLN
1.0000 mg | Freq: Once | INTRAMUSCULAR | Status: AC
  Administered 2011-06-24: 1 mg via INTRAVENOUS
  Filled 2011-06-24: qty 1

## 2011-06-24 MED ORDER — SODIUM CHLORIDE 0.9 % IV BOLUS (SEPSIS)
250.0000 mL | Freq: Once | INTRAVENOUS | Status: AC
  Administered 2011-06-24: 250 mL via INTRAVENOUS

## 2011-06-24 MED ORDER — IOHEXOL 300 MG/ML  SOLN: 100.0000 mL | Freq: Once | INTRAMUSCULAR | Status: DC | PRN

## 2011-06-24 MED ORDER — SODIUM CHLORIDE 0.9 % IV SOLN
INTRAVENOUS | Status: DC
  Administered 2011-06-24: 23:00:00 via INTRAVENOUS

## 2011-06-24 MED ORDER — ONDANSETRON HCL 4 MG/2ML IJ SOLN
4.0000 mg | Freq: Once | INTRAMUSCULAR | Status: AC
  Administered 2011-06-24: 4 mg via INTRAVENOUS
  Filled 2011-06-24: qty 2

## 2011-06-24 MED ORDER — IOHEXOL 300 MG/ML  SOLN
100.0000 mL | Freq: Once | INTRAMUSCULAR | Status: AC | PRN
  Administered 2011-06-24: 100 mL via INTRAVENOUS

## 2011-06-24 NOTE — ED Notes (Signed)
Hematuria 3 days ago, low abdominal pain getting worse

## 2011-06-24 NOTE — ED Notes (Signed)
Pt medicated for pain and nausea.  Tolerating oral CT prep at this time.

## 2011-06-24 NOTE — ED Provider Notes (Signed)
History   This chart was scribed for Shelda Jakes, MD by Sofie Rower. The patient was seen in room APA01/APA01 and the patient's care was started at 9:18PM.    CSN: [id number]  Arrival date & time 06/24/11  1859   First MD Initiated Contact with Patient 06/24/11 2056      Chief Complaint  Patient presents with  . Abdominal Pain    (Consider location/radiation/quality/duration/timing/severity/associated sxs/prior treatment) HPI  [name] is a 77 y.o. male who presents to the Emergency Department complaining of moderate, constant abdominal pain located suprapubically onset today days ago with associated symptoms of hematuria, vomiting, dry mouth. Pt states he passed blood Saturday night, and pain began this morning (10:30AM), pain progressively became worse.  Pt denies any symptoms like these before, diarrhea, back pain, neck pain, sore throat, cough, congestion, headache, fever, chest pain, difficulty breathing, rash, swelling in the legs.   PCP is Dr. Janna Arch.   Past Medical History  Diagnosis Date  . Arteriosclerotic cardiovascular disease (ASCVD)   . Diabetes mellitus   . Atrial fibrillation     Onset in 2012  . COPD (chronic obstructive pulmonary disease)   . Gout   . DJD (degenerative joint disease)   . Hyperlipidemia   . Obstructive sleep apnea   . Chronic anticoagulation 2012    2012    Past Surgical History  Procedure Date  . Cholecystectomy   . Knee surgery   . Vasectomy     Family History  Problem Relation Age of Onset  . Heart failure Mother   . Heart failure Father     History  Substance Use Topics  . Smoking status: Former Smoker -- 1.0 packs/day for 40 years    Types: Cigarettes    Quit date: 04/08/1992  . Smokeless tobacco: Never Used  . Alcohol Use: No      Review of Systems  All other systems reviewed and are negative.    10 Systems reviewed and are negative for acute change except as noted in the HPI.   Allergies    Novocain  Home Medications   Current Outpatient Rx  Name Route Sig Dispense Refill  . CARVEDILOL 12.5 MG PO TABS Oral Take 1 tablet (12.5 mg total) by mouth 2 (two) times daily with a meal. 60 tablet 5  . DIGOXIN 0.25 MG PO TABS Oral Take 1 tablet (0.25 mg total) by mouth daily. 30 tablet 3  . DILTIAZEM HCL ER COATED BEADS 180 MG PO CP24 Oral Take 1 capsule (180 mg total) by mouth daily. 30 capsule 5  . FUROSEMIDE 40 MG PO TABS Oral Take 40 mg by mouth daily.      Marland Kitchen MAGNESIUM OXIDE 400 MG PO TABS Oral Take 400 mg by mouth 2 (two) times daily.      Marland Kitchen METFORMIN HCL 500 MG PO TABS Oral Take 500 mg by mouth 2 (two) times daily with a meal.      . FISH OIL BURP-LESS 1000 MG PO CAPS Oral Take 1,000 mg by mouth daily.      Marland Kitchen PRAVASTATIN SODIUM 40 MG PO TABS Oral Take 40 mg by mouth at bedtime.    . SPIRONOLACTONE 25 MG PO TABS Oral Take 1 tablet (25 mg total) by mouth daily. 30 tablet 3  . TAMSULOSIN HCL 0.4 MG PO CAPS Oral Take 1 capsule (0.4 mg total) by mouth daily. 30 capsule 3  . VITAMIN E 400 UNITS PO CAPS Oral Take 400 Units by  mouth daily.      . WARFARIN SODIUM 5 MG PO TABS Oral Take 5 mg by mouth daily. Patient takes 1 tablet (5mg ) 1 day then 1/2 tablet (2.5mg ) for the next 2 days then repeat cycle      BP 135/83  Pulse 67  Resp 16  Ht 5' 4.5" (1.638 m)  Wt 190 lb (86.183 kg)  BMI 32.11 kg/m2  SpO2 99%  Physical Exam  Nursing note and vitals reviewed. Constitutional: He is oriented to person, place, and time. He appears well-developed and well-nourished.  HENT:  Head: Normocephalic and atraumatic.  Right Ear: External ear normal.  Left Ear: External ear normal.  Nose: Nose normal.  Eyes: Conjunctivae and EOM are normal. No scleral icterus.  Neck: Normal range of motion. Neck supple. No thyromegaly present.  Cardiovascular: Normal rate, regular rhythm and normal heart sounds.  Exam reveals no gallop and no friction rub.   No murmur heard. Pulmonary/Chest: Breath sounds  normal. No stridor. He has no wheezes. He has no rales. He exhibits no tenderness.  Abdominal: Bowel sounds are normal. He exhibits no distension. There is tenderness (LLQ, RLQ, Suprapubically.). There is no rebound.  Musculoskeletal: Normal range of motion. He exhibits no edema.  Lymphadenopathy:    He has no cervical adenopathy.  Neurological: He is oriented to person, place, and time. Coordination normal.  Skin: Skin is warm and dry. No rash noted. No erythema.  Psychiatric: He has a normal mood and affect. His behavior is normal.    ED Course  Procedures (including critical care time)  DIAGNOSTIC STUDIES: Oxygen Saturation is 99% on room air, normal by my interpretation.    COORDINATION OF CARE:   Results for orders placed during the hospital encounter of 06/24/11  COMPREHENSIVE METABOLIC PANEL      Component Value Range   Sodium 133 (*) 135 - 145 (mEq/L)   Potassium 5.5 (*) 3.5 - 5.1 (mEq/L)   Chloride 93 (*) 96 - 112 (mEq/L)   CO2 24  19 - 32 (mEq/L)   Glucose, Bld 260 (*) 70 - 99 (mg/dL)   BUN 19  6 - 23 (mg/dL)   Creatinine, Ser 1.61  0.50 - 1.35 (mg/dL)   Calcium 09.6  8.4 - 10.5 (mg/dL)   Total Protein 7.6  6.0 - 8.3 (g/dL)   Albumin 4.2  3.5 - 5.2 (g/dL)   AST 35  0 - 37 (U/L)   ALT 29  0 - 53 (U/L)   Alkaline Phosphatase 58  39 - 117 (U/L)   Total Bilirubin 0.9  0.3 - 1.2 (mg/dL)   GFR calc non Af Amer 79 (*) >90 (mL/min)   GFR calc Af Amer >90  >90 (mL/min)  URINALYSIS, ROUTINE W REFLEX MICROSCOPIC      Component Value Range   Color, Urine BROWN (*) YELLOW    APPearance CLOUDY (*) CLEAR    Specific Gravity, Urine >1.030 (*) 1.005 - 1.030    pH 5.5  5.0 - 8.0    Glucose, UA NEGATIVE  NEGATIVE (mg/dL)   Hgb urine dipstick LARGE (*) NEGATIVE    Bilirubin Urine SMALL (*) NEGATIVE    Ketones, ur 40 (*) NEGATIVE (mg/dL)   Protein, ur 30 (*) NEGATIVE (mg/dL)   Urobilinogen, UA 0.2  0.0 - 1.0 (mg/dL)   Nitrite NEGATIVE  NEGATIVE    Leukocytes, UA NEGATIVE   NEGATIVE   CBC      Component Value Range   WBC 13.2 (*) 4.0 - 10.5 (  K/uL)   RBC 4.76  4.22 - 5.81 (MIL/uL)   Hemoglobin 16.1  13.0 - 17.0 (g/dL)   HCT 95.6  21.3 - 08.6 (%)   MCV 96.6  78.0 - 100.0 (fL)   MCH 33.8  26.0 - 34.0 (pg)   MCHC 35.0  30.0 - 36.0 (g/dL)   RDW 57.8  46.9 - 62.9 (%)   Platelets 176  150 - 400 (K/uL)  DIFFERENTIAL      Component Value Range   Neutrophils Relative 84 (*) 43 - 77 (%)   Neutro Abs 11.1 (*) 1.7 - 7.7 (K/uL)   Lymphocytes Relative 12  12 - 46 (%)   Lymphs Abs 1.6  0.7 - 4.0 (K/uL)   Monocytes Relative 4  3 - 12 (%)   Monocytes Absolute 0.5  0.1 - 1.0 (K/uL)   Eosinophils Relative 0  0 - 5 (%)   Eosinophils Absolute 0.0  0.0 - 0.7 (K/uL)   Basophils Relative 0  0 - 1 (%)   Basophils Absolute 0.0  0.0 - 0.1 (K/uL)  LIPASE, BLOOD      Component Value Range   Lipase 18  11 - 59 (U/L)  URINE MICROSCOPIC-ADD ON      Component Value Range   Squamous Epithelial / LPF RARE  RARE    WBC, UA 0-2  <3 (WBC/hpf)   RBC / HPF TOO NUMEROUS TO COUNT  <3 (RBC/hpf)   Bacteria, UA FEW (*) RARE    Urine-Other YEAST    POTASSIUM      Component Value Range   Potassium 5.8 (*) 3.5 - 5.1 (mEq/L)   Ct Abdomen Pelvis W Contrast  06/24/2011  *RADIOLOGY REPORT*  Clinical Data: Abdominal pain  CT ABDOMEN AND PELVIS WITH CONTRAST  Technique:  Multidetector CT imaging of the abdomen and pelvis was performed following the standard protocol during bolus administration of intravenous contrast.  Contrast: OMNIPAQUE IOHEXOL 300 MG/ML IJ SOLN  Comparison: 08/15/2008  Findings: The lung bases appear clear.  No pericardial or pleural effusion.  RCA, LAD, and left circumflex coronary artery calcifications noted.  No pericardial or pleural effusion noted.  Diffuse fatty infiltration of the liver is identified.  No focal liver abnormalities.  The spleen is normal.  The adrenal glands are normal.  Prior cholecystectomy.  The pancreas is unremarkable.  The adrenal glands both  appear normal.  There is a nonobstructing stone within the inferior pole of the left kidney measuring 4.4 mm, image 45.  Within the upper pole of the right kidney there is a small stone measuring 3.5 mm.  Within the distal third of the right ureter there is a 5-2 mm stone, image 77.  This causes a right-sided hydroureter and periureteral fat stranding.  The urinary bladder appears normal.  Prostate gland and seminal vesicles are normal.  The stomach and the small bowel loops are unremarkable.  The appendix is identified and appears normal.  Multiple colonic diverticula identified without acute inflammation.  No free fluid or fluid collections within the abdomen or pelvis.  IMPRESSION:  1.  Distal right ureteral calculus measures 5.2 mm. 2.  Prior cholecystectomy. 3.  Fatty infiltration of the liver. 4.  There is atherosclerosis of the thoracic aorta, the great vessels of the mediastinum and the coronary arteries, including calcified atherosclerotic plaque in the RCA, LAD, and left circumflex coronary arteries.  Atherosclerosis, including three-vessel coronary artery disease. Please note that although the presence of coronary artery calcium documents the presence of coronary  artery disease, the severity of this disease and any potential stenosis cannot be assessed on this non-gated CT examination.  Assessment for potential risk factor modification, dietary therapy or pharmacologic therapy may be warranted, if clinically indicated.  Original Report Authenticated By: Rosealee Albee, M.D.      Labs Reviewed  COMPREHENSIVE METABOLIC PANEL - Abnormal; Notable for the following:    Sodium 133 (*)    Potassium 5.5 (*)    Chloride 93 (*)    Glucose, Bld 260 (*)    GFR calc non Af Amer 79 (*)    All other components within normal limits  URINALYSIS, ROUTINE W REFLEX MICROSCOPIC - Abnormal; Notable for the following:    Color, Urine BROWN (*) BIOCHEMICALS MAY BE AFFECTED BY COLOR   APPearance CLOUDY (*)     Specific Gravity, Urine >1.030 (*)    Hgb urine dipstick LARGE (*)    Bilirubin Urine SMALL (*)    Ketones, ur 40 (*)    Protein, ur 30 (*)    All other components within normal limits  CBC - Abnormal; Notable for the following:    WBC 13.2 (*)    All other components within normal limits  DIFFERENTIAL - Abnormal; Notable for the following:    Neutrophils Relative 84 (*)    Neutro Abs 11.1 (*)    All other components within normal limits  URINE MICROSCOPIC-ADD ON - Abnormal; Notable for the following:    Bacteria, UA FEW (*)    All other components within normal limits  POTASSIUM - Abnormal; Notable for the following:    Potassium 5.8 (*)    All other components within normal limits  LIPASE, BLOOD  URINE CULTURE   Ct Abdomen Pelvis W Contrast  06/24/2011  *RADIOLOGY REPORT*  Clinical Data: Abdominal pain  CT ABDOMEN AND PELVIS WITH CONTRAST  Technique:  Multidetector CT imaging of the abdomen and pelvis was performed following the standard protocol during bolus administration of intravenous contrast.  Contrast: OMNIPAQUE IOHEXOL 300 MG/ML IJ SOLN  Comparison: 08/15/2008  Findings: The lung bases appear clear.  No pericardial or pleural effusion.  RCA, LAD, and left circumflex coronary artery calcifications noted.  No pericardial or pleural effusion noted.  Diffuse fatty infiltration of the liver is identified.  No focal liver abnormalities.  The spleen is normal.  The adrenal glands are normal.  Prior cholecystectomy.  The pancreas is unremarkable.  The adrenal glands both appear normal.  There is a nonobstructing stone within the inferior pole of the left kidney measuring 4.4 mm, image 45.  Within the upper pole of the right kidney there is a small stone measuring 3.5 mm.  Within the distal third of the right ureter there is a 5-2 mm stone, image 77.  This causes a right-sided hydroureter and periureteral fat stranding.  The urinary bladder appears normal.  Prostate gland and seminal  vesicles are normal.  The stomach and the small bowel loops are unremarkable.  The appendix is identified and appears normal.  Multiple colonic diverticula identified without acute inflammation.  No free fluid or fluid collections within the abdomen or pelvis.  IMPRESSION:  1.  Distal right ureteral calculus measures 5.2 mm. 2.  Prior cholecystectomy. 3.  Fatty infiltration of the liver. 4.  There is atherosclerosis of the thoracic aorta, the great vessels of the mediastinum and the coronary arteries, including calcified atherosclerotic plaque in the RCA, LAD, and left circumflex coronary arteries.  Atherosclerosis, including three-vessel coronary artery disease.  Please note that although the presence of coronary artery calcium documents the presence of coronary artery disease, the severity of this disease and any potential stenosis cannot be assessed on this non-gated CT examination.  Assessment for potential risk factor modification, dietary therapy or pharmacologic therapy may be warranted, if clinically indicated.  Original Report Authenticated By: Rosealee Albee, M.D.     1. Hyperkalemia   2. Ureteral stone     9:20PM- EDP at bedside discusses treatment plan.   MDM  CT scan clearly depicts right-sided distal ureter stone 5.2 mm and hematuria however patient has elevated potassium has not been elevated in the past BUN and creatinine are normal however GFR is a little abnormal may be related to some obstruction from the stone. Patient will require admission given Kayexalate for hyperkalemia here will need a monitored bed will be [facility] him overnight. Kayexalate ordered here in the emergency department orally. Patient's pain under control from the kidney stone. Other medications that he on that could be playing a role would be [email or web address]. Also patient is on Lasix but if anything that would make potassium lower.  Initially thought the potassium to be elevated due to  hemolysis or was repeated there went from 5.5-5.8 thus requiring the admission for observation. Patient will be admitted to an observation bed.      I personally performed the services described in this documentation, which was scribed in my presence. The recorded information has been reviewed and considered.     Shelda Jakes, MD 06/25/11 [phone]

## 2011-06-24 NOTE — ED Notes (Signed)
Pt reporting pain in lower abdomen.  Reporting nausea as well.  No vomiting.  Pt did report passing blood in urine on Friday, but states none at present.  Reports pain is localized to lower abdomen, denies pain in back.

## 2011-06-25 ENCOUNTER — Other Ambulatory Visit: Payer: Self-pay

## 2011-06-25 ENCOUNTER — Encounter (HOSPITAL_COMMUNITY): Payer: Self-pay | Admitting: *Deleted

## 2011-06-25 DIAGNOSIS — E875 Hyperkalemia: Secondary | ICD-10-CM | POA: Diagnosis present

## 2011-06-25 DIAGNOSIS — N201 Calculus of ureter: Secondary | ICD-10-CM | POA: Diagnosis present

## 2011-06-25 LAB — COMPREHENSIVE METABOLIC PANEL
Alkaline Phosphatase: 48 U/L (ref 39–117)
BUN: 19 mg/dL (ref 6–23)
CO2: 26 mEq/L (ref 19–32)
Chloride: 93 mEq/L — ABNORMAL LOW (ref 96–112)
Creatinine, Ser: 1.49 mg/dL — ABNORMAL HIGH (ref 0.50–1.35)
GFR calc Af Amer: 52 mL/min — ABNORMAL LOW (ref >90)
GFR calc non Af Amer: 44 mL/min — ABNORMAL LOW (ref >90)
Glucose, Bld: 191 mg/dL — ABNORMAL HIGH (ref 70–99)
Potassium: 4.4 mEq/L (ref 3.5–5.1)
Total Bilirubin: 0.8 mg/dL (ref 0.3–1.2)

## 2011-06-25 LAB — GLUCOSE, CAPILLARY: Glucose-Capillary: 171 mg/dL — ABNORMAL HIGH (ref 70–99)

## 2011-06-25 LAB — URINE CULTURE

## 2011-06-25 LAB — CBC
MCV: 97.3 fL (ref 78.0–100.0)
Platelets: 155 10*3/uL (ref 150–400)
RDW: 13.4 % (ref 11.5–15.5)
WBC: 13.4 10*3/uL — ABNORMAL HIGH (ref 4.0–10.5)

## 2011-06-25 LAB — PROTIME-INR
INR: 2.3 — ABNORMAL HIGH (ref 0.00–1.49)
Prothrombin Time: 25.7 seconds — ABNORMAL HIGH (ref 11.6–15.2)

## 2011-06-25 LAB — MRSA PCR SCREENING: MRSA by PCR: NEGATIVE

## 2011-06-25 LAB — POTASSIUM: Potassium: 5.8 mEq/L — ABNORMAL HIGH (ref 3.5–5.1)

## 2011-06-25 MED ORDER — ONDANSETRON HCL 4 MG/2ML IJ SOLN: 4.0000 mg | Freq: Three times a day (TID) | INTRAMUSCULAR | Status: DC | PRN

## 2011-06-25 MED ORDER — ALBUTEROL SULFATE (5 MG/ML) 0.5% IN NEBU: 2.5000 mg | INHALATION_SOLUTION | RESPIRATORY_TRACT | Status: DC | PRN

## 2011-06-25 MED ORDER — CIPROFLOXACIN IN D5W 400 MG/200ML IV SOLN
INTRAVENOUS | Status: AC
  Filled 2011-06-25: qty 200

## 2011-06-25 MED ORDER — SODIUM POLYSTYRENE SULFONATE 15 GM/60ML PO SUSP
15.0000 g | Freq: Once | ORAL | Status: AC
  Administered 2011-06-25: 30 g via ORAL
  Filled 2011-06-25: qty 120

## 2011-06-25 MED ORDER — FUROSEMIDE 40 MG PO TABS
40.0000 mg | ORAL_TABLET | Freq: Every day | ORAL | Status: DC
  Administered 2011-06-25 – 2011-06-29 (×4): 40 mg via ORAL
  Filled 2011-06-25 (×4): qty 1

## 2011-06-25 MED ORDER — WARFARIN SODIUM 2.5 MG PO TABS: 2.5000 mg | ORAL_TABLET | Freq: Once | ORAL | Status: AC

## 2011-06-25 MED ORDER — OXYCODONE HCL 5 MG PO TABS
5.0000 mg | ORAL_TABLET | ORAL | Status: DC | PRN
  Administered 2011-06-26 – 2011-06-29 (×3): 5 mg via ORAL
  Filled 2011-06-25 (×3): qty 1

## 2011-06-25 MED ORDER — SODIUM CHLORIDE 0.45 % IV SOLN
INTRAVENOUS | Status: DC
  Administered 2011-06-25: 03:00:00 via INTRAVENOUS
  Administered 2011-06-27: 20 mL/h via INTRAVENOUS

## 2011-06-25 MED ORDER — SODIUM CHLORIDE 0.9 % IJ SOLN
3.0000 mL | Freq: Two times a day (BID) | INTRAMUSCULAR | Status: DC
  Administered 2011-06-25 – 2011-06-26 (×4): 3 mL via INTRAVENOUS
  Filled 2011-06-25 (×5): qty 3

## 2011-06-25 MED ORDER — MAGNESIUM OXIDE 400 MG PO TABS
400.0000 mg | ORAL_TABLET | Freq: Two times a day (BID) | ORAL | Status: DC
  Administered 2011-06-25 – 2011-06-29 (×9): 400 mg via ORAL
  Filled 2011-06-25 (×9): qty 1

## 2011-06-25 MED ORDER — CIPROFLOXACIN IN D5W 400 MG/200ML IV SOLN
400.0000 mg | Freq: Two times a day (BID) | INTRAVENOUS | Status: DC
  Administered 2011-06-25 – 2011-06-29 (×9): 400 mg via INTRAVENOUS
  Filled 2011-06-25 (×13): qty 200

## 2011-06-25 MED ORDER — HYDROMORPHONE HCL PF 1 MG/ML IJ SOLN
1.0000 mg | Freq: Once | INTRAMUSCULAR | Status: AC
  Administered 2011-06-25: 1 mg via INTRAVENOUS
  Filled 2011-06-25 (×2): qty 1

## 2011-06-25 MED ORDER — SIMVASTATIN 20 MG PO TABS: 20.0000 mg | ORAL_TABLET | Freq: Every day | ORAL | Status: DC

## 2011-06-25 MED ORDER — DILTIAZEM HCL ER COATED BEADS 180 MG PO CP24
180.0000 mg | ORAL_CAPSULE | Freq: Every day | ORAL | Status: DC
  Administered 2011-06-25 – 2011-06-29 (×4): 180 mg via ORAL
  Filled 2011-06-25 (×4): qty 1

## 2011-06-25 MED ORDER — ONDANSETRON HCL 4 MG/2ML IJ SOLN
4.0000 mg | Freq: Four times a day (QID) | INTRAMUSCULAR | Status: DC | PRN
  Administered 2011-06-25 – 2011-06-28 (×4): 4 mg via INTRAVENOUS
  Filled 2011-06-25 (×3): qty 2

## 2011-06-25 MED ORDER — WARFARIN - PHARMACIST DOSING INPATIENT: Freq: Every day | Status: DC

## 2011-06-25 MED ORDER — CARVEDILOL 12.5 MG PO TABS
12.5000 mg | ORAL_TABLET | Freq: Two times a day (BID) | ORAL | Status: DC
  Administered 2011-06-25 – 2011-06-29 (×6): 12.5 mg via ORAL
  Filled 2011-06-25 (×7): qty 1

## 2011-06-25 MED ORDER — HYDROMORPHONE HCL PF 1 MG/ML IJ SOLN
1.0000 mg | INTRAMUSCULAR | Status: DC | PRN
  Administered 2011-06-25 – 2011-06-28 (×4): 1 mg via INTRAVENOUS
  Filled 2011-06-25 (×4): qty 1

## 2011-06-25 MED ORDER — ONDANSETRON HCL 4 MG/2ML IJ SOLN
4.0000 mg | Freq: Once | INTRAMUSCULAR | Status: AC
  Administered 2011-06-25: 4 mg via INTRAVENOUS
  Filled 2011-06-25 (×2): qty 2

## 2011-06-25 MED ORDER — ACETAMINOPHEN 650 MG RE SUPP: 650.0000 mg | Freq: Four times a day (QID) | RECTAL | Status: DC | PRN

## 2011-06-25 MED ORDER — TAMSULOSIN HCL 0.4 MG PO CAPS
0.4000 mg | ORAL_CAPSULE | Freq: Every day | ORAL | Status: DC
  Administered 2011-06-25 – 2011-06-29 (×4): 0.4 mg via ORAL
  Filled 2011-06-25 (×4): qty 1

## 2011-06-25 MED ORDER — SODIUM POLYSTYRENE SULFONATE 15 GM/60ML PO SUSP: 15.0000 g | Freq: Once | ORAL | Status: DC

## 2011-06-25 MED ORDER — INSULIN ASPART 100 UNIT/ML ~~LOC~~ SOLN
0.0000 [IU] | Freq: Three times a day (TID) | SUBCUTANEOUS | Status: DC
  Administered 2011-06-25: 5 [IU] via SUBCUTANEOUS
  Administered 2011-06-25: 3 [IU] via SUBCUTANEOUS
  Administered 2011-06-25: 5 [IU] via SUBCUTANEOUS
  Administered 2011-06-26: 3 [IU] via SUBCUTANEOUS
  Administered 2011-06-26: 5 [IU] via SUBCUTANEOUS
  Administered 2011-06-26: 3 [IU] via SUBCUTANEOUS
  Administered 2011-06-27: 5 [IU] via SUBCUTANEOUS
  Administered 2011-06-27: 3 [IU] via SUBCUTANEOUS
  Administered 2011-06-27: 11 [IU] via SUBCUTANEOUS
  Administered 2011-06-28: 5 [IU] via SUBCUTANEOUS
  Administered 2011-06-28 – 2011-06-29 (×2): 3 [IU] via SUBCUTANEOUS

## 2011-06-25 MED ORDER — ACETAMINOPHEN 325 MG PO TABS: 650.0000 mg | ORAL_TABLET | Freq: Four times a day (QID) | ORAL | Status: DC | PRN

## 2011-06-25 MED ORDER — ONDANSETRON HCL 4 MG PO TABS: 4.0000 mg | ORAL_TABLET | Freq: Four times a day (QID) | ORAL | Status: DC | PRN

## 2011-06-25 MED ORDER — ATORVASTATIN CALCIUM 10 MG PO TABS
10.0000 mg | ORAL_TABLET | Freq: Every day | ORAL | Status: DC
  Administered 2011-06-25 – 2011-06-28 (×4): 10 mg via ORAL
  Filled 2011-06-25 (×4): qty 1

## 2011-06-25 MED ORDER — HYDROMORPHONE HCL PF 1 MG/ML IJ SOLN: 1.0000 mg | INTRAMUSCULAR | Status: DC | PRN

## 2011-06-25 MED ORDER — SODIUM CHLORIDE 0.9 % IV SOLN: INTRAVENOUS | Status: DC

## 2011-06-25 NOTE — Progress Notes (Signed)
[id number] 

## 2011-06-25 NOTE — Progress Notes (Signed)
UR Chart Review Completed  

## 2011-06-25 NOTE — H&P (Signed)
Story [name] is an 77 y.o. male.    PCP: [name], MD, MD   Chief Complaint: Abdominal pain, nausea, vomiting, blood in the urine  HPI: This is a 77 year old, Caucasian male, with past medical history of native fibrillation, congestive heart failure, which is systolic in nature, Diabetes, Coronary artery disease who was in his usual state of health about 3 days ago, when he started noticing blood in the urine. Then, he did not have any further bleeding on the following day. And, then today he started having pain in the abdomen. Located mostly in the lower abdomen, but started radiating towards the rest of his abdomen. The pain was 10 out of 10 in intensity. He had one episode of bilious emesis. He felt like he needed to go to the bathroom to have a bowel movement, but was unable to do so. Had painful urination. Denies any fever, but has been having chills. He had a bowel movement earlier today, which was loose, which is chronic for him. Since his symptoms were getting worse, he decided to come in to the hospital. He's feeling somewhat better at this time. We were called for admission because of his elevated potassium levels.   Home Medications: Prior to Admission medications   Medication Sig Start Date End Date Taking? Authorizing Provider  carvedilol (COREG) 12.5 MG tablet Take 1 tablet (12.5 mg total) by mouth 2 (two) times daily with a meal. 02/20/11 02/20/12 Yes [name], MD  digoxin (LANOXIN) 0.25 MG tablet Take 1 tablet (0.25 mg total) by mouth daily. 02/20/11 02/20/12 Yes [name], MD  diltiazem (CARDIZEM CD) 180 MG 24 hr capsule Take 1 capsule (180 mg total) by mouth daily. 02/20/11 02/20/12 Yes [name], MD  furosemide (LASIX) 40 MG tablet Take 40 mg by mouth daily.     Yes Historical Provider, MD  magnesium oxide (MAG-OX) 400 MG tablet Take 400 mg by mouth 2 (two) times daily.     Yes Historical Provider, MD  metFORMIN (GLUCOPHAGE) 500 MG tablet  Take 500 mg by mouth 2 (two) times daily with a meal.     Yes Historical Provider, MD  Omega-3 Fatty Acids (FISH OIL BURP-LESS) 1000 MG CAPS Take 1,000 mg by mouth daily.     Yes Historical Provider, MD  pravastatin (PRAVACHOL) 40 MG tablet Take 40 mg by mouth at bedtime. 02/20/11 02/20/12 Yes [name], MD  spironolactone (ALDACTONE) 25 MG tablet Take 1 tablet (25 mg total) by mouth daily. 02/20/11 02/20/12 Yes [name], MD  Tamsulosin HCl (FLOMAX) 0.4 MG CAPS Take 1 capsule (0.4 mg total) by mouth daily. 02/20/11  Yes [name], MD  vitamin E 400 UNIT capsule Take 400 Units by mouth daily.     Yes Historical Provider, MD  warfarin (COUMADIN) 5 MG tablet Take 5 mg by mouth daily. Patient takes 1 tablet (5mg ) 1 day then 1/2 tablet (2.5mg ) for the next 2 days then repeat cycle 02/20/11 02/20/12 Yes [name], MD    Allergies:  Allergies  Allergen Reactions  . Novocain     Past Medical History: Past Medical History  Diagnosis Date  . Arteriosclerotic cardiovascular disease (ASCVD)   . Diabetes mellitus   . Atrial fibrillation     Onset in 2012  . COPD (chronic obstructive pulmonary disease)   . Gout   . DJD (degenerative joint disease)   . Hyperlipidemia   . Obstructive sleep apnea   . Chronic  anticoagulation 2012    2012    Past Surgical History  Procedure Date  . Cholecystectomy   . Knee surgery   . Vasectomy     Social History:  reports that he quit smoking about 19 years ago. His smoking use included Cigarettes. He has a 40 pack-year smoking history. He has never used smokeless tobacco. He reports that he does not drink alcohol or use illicit drugs.  Family History:  Family History  Problem Relation Age of Onset  . Heart failure Mother   . Heart failure Father     Review of Systems - History obtained from the patient General ROS: negative Psychological ROS: negative Ophthalmic ROS: negative ENT ROS: negative Allergy and  Immunology ROS: negative Hematological and Lymphatic ROS: negative Endocrine ROS: negative Respiratory ROS: no cough, shortness of breath, or wheezing Cardiovascular ROS: no chest pain or dyspnea on exertion Gastrointestinal ROS: positive for - nausea/vomiting Genito-Urinary ROS: positive for - dysuria, hematuria and urinary frequency/urgency Musculoskeletal ROS: negative Neurological ROS: negative Dermatological ROS: negative  Physical Examination Blood pressure 135/83, pulse 67, resp. rate 16, height 5' 4.5" (1.638 m), weight 86.183 kg (190 lb), SpO2 99.00%.  General appearance: alert, cooperative, appears stated age, no distress and moderately obese Head: Normocephalic, without obvious abnormality, atraumatic Eyes: conjunctivae/corneas clear. PERRL, EOM's intact.  Throat: lips, mucosa, and tongue normal; teeth and gums normal Neck: no adenopathy, no carotid bruit, no JVD, supple, symmetrical, trachea midline and thyroid not enlarged, symmetric, no tenderness/mass/nodules Resp: clear to auscultation bilaterally Cardio: regular rate and rhythm, S1, S2 normal, no murmur, click, rub or gallop GI: soft, tenderness, was present in the suprapubic area without any rebound, rigidity, or guarding. No masses, or organomegaly was appreciated. Extremities: extremities normal, atraumatic, no cyanosis or edema Pulses: 2+ and symmetric Skin: Skin color, texture, turgor normal. No rashes or lesions Lymph nodes: Cervical, supraclavicular, and axillary nodes normal. Neurologic: Grossly normal  Laboratory Data: Results for orders placed during the hospital encounter of 06/24/11 (from the past 48 hour(s))  COMPREHENSIVE METABOLIC PANEL     Status: Abnormal   Collection Time   06/24/11  9:31 PM      Component Value Range Comment   Sodium 133 (*) 135 - 145 (mEq/L)    Potassium 5.5 (*) 3.5 - 5.1 (mEq/L)    Chloride 93 (*) 96 - 112 (mEq/L)    CO2 24  19 - 32 (mEq/L)    Glucose, Bld 260 (*) 70 - 99  (mg/dL)    BUN 19  6 - 23 (mg/dL)    Creatinine, Ser 8.29  0.50 - 1.35 (mg/dL)    Calcium 56.2  8.4 - 10.5 (mg/dL)    Total Protein 7.6  6.0 - 8.3 (g/dL)    Albumin 4.2  3.5 - 5.2 (g/dL)    AST 35  0 - 37 (U/L)    ALT 29  0 - 53 (U/L)    Alkaline Phosphatase 58  39 - 117 (U/L)    Total Bilirubin 0.9  0.3 - 1.2 (mg/dL)    GFR calc non Af Amer 79 (*) >90 (mL/min)    GFR calc Af Amer >90  >90 (mL/min)   CBC     Status: Abnormal   Collection Time   06/24/11  9:31 PM      Component Value Range Comment   WBC 13.2 (*) 4.0 - 10.5 (K/uL)    RBC 4.76  4.22 - 5.81 (MIL/uL)    Hemoglobin 16.1  13.0 - 17.0 (  g/dL)    HCT 16.1  09.6 - 04.5 (%)    MCV 96.6  78.0 - 100.0 (fL)    MCH 33.8  26.0 - 34.0 (pg)    MCHC 35.0  30.0 - 36.0 (g/dL)    RDW 40.9  81.1 - 91.4 (%)    Platelets 176  150 - 400 (K/uL)   DIFFERENTIAL     Status: Abnormal   Collection Time   06/24/11  9:31 PM      Component Value Range Comment   Neutrophils Relative 84 (*) 43 - 77 (%)    Neutro Abs 11.1 (*) 1.7 - 7.7 (K/uL)    Lymphocytes Relative 12  12 - 46 (%)    Lymphs Abs 1.6  0.7 - 4.0 (K/uL)    Monocytes Relative 4  3 - 12 (%)    Monocytes Absolute 0.5  0.1 - 1.0 (K/uL)    Eosinophils Relative 0  0 - 5 (%)    Eosinophils Absolute 0.0  0.0 - 0.7 (K/uL)    Basophils Relative 0  0 - 1 (%)    Basophils Absolute 0.0  0.0 - 0.1 (K/uL)   LIPASE, BLOOD     Status: Normal   Collection Time   06/24/11  9:31 PM      Component Value Range Comment   Lipase 18  11 - 59 (U/L)   URINALYSIS, ROUTINE W REFLEX MICROSCOPIC     Status: Abnormal   Collection Time   06/24/11 10:21 PM      Component Value Range Comment   Color, Urine BROWN (*) YELLOW  BIOCHEMICALS MAY BE AFFECTED BY COLOR   APPearance CLOUDY (*) CLEAR     Specific Gravity, Urine >1.030 (*) 1.005 - 1.030     pH 5.5  5.0 - 8.0     Glucose, UA NEGATIVE  NEGATIVE (mg/dL)    Hgb urine dipstick LARGE (*) NEGATIVE     Bilirubin Urine SMALL (*) NEGATIVE     Ketones, ur 40 (*)  NEGATIVE (mg/dL)    Protein, ur 30 (*) NEGATIVE (mg/dL)    Urobilinogen, UA 0.2  0.0 - 1.0 (mg/dL)    Nitrite NEGATIVE  NEGATIVE     Leukocytes, UA NEGATIVE  NEGATIVE    URINE MICROSCOPIC-ADD ON     Status: Abnormal   Collection Time   06/24/11 10:21 PM      Component Value Range Comment   Squamous Epithelial / LPF RARE  RARE     WBC, UA 0-2  <3 (WBC/hpf)    RBC / HPF TOO NUMEROUS TO COUNT  <3 (RBC/hpf)    Bacteria, UA FEW (*) RARE     Urine-Other YEAST     POTASSIUM     Status: Abnormal   Collection Time   06/25/11 12:29 AM      Component Value Range Comment   Potassium 5.8 (*) 3.5 - 5.1 (mEq/L)     Radiology Reports: Ct Abdomen Pelvis W Contrast  06/24/2011  *RADIOLOGY REPORT*  Clinical Data: Abdominal pain  CT ABDOMEN AND PELVIS WITH CONTRAST  Technique:  Multidetector CT imaging of the abdomen and pelvis was performed following the standard protocol during bolus administration of intravenous contrast.  Contrast: OMNIPAQUE IOHEXOL 300 MG/ML IJ SOLN  Comparison: 08/15/2008  Findings: The lung bases appear clear.  No pericardial or pleural effusion.  RCA, LAD, and left circumflex coronary artery calcifications noted.  No pericardial or pleural effusion noted.  Diffuse fatty infiltration of the liver is identified.  No focal liver abnormalities.  The spleen is normal.  The adrenal glands are normal.  Prior cholecystectomy.  The pancreas is unremarkable.  The adrenal glands both appear normal.  There is a nonobstructing stone within the inferior pole of the left kidney measuring 4.4 mm, image 45.  Within the upper pole of the right kidney there is a small stone measuring 3.5 mm.  Within the distal third of the right ureter there is a 5-2 mm stone, image 77.  This causes a right-sided hydroureter and periureteral fat stranding.  The urinary bladder appears normal.  Prostate gland and seminal vesicles are normal.  The stomach and the small bowel loops are unremarkable.  The appendix is  identified and appears normal.  Multiple colonic diverticula identified without acute inflammation.  No free fluid or fluid collections within the abdomen or pelvis.  IMPRESSION:  1.  Distal right ureteral calculus measures 5.2 mm. 2.  Prior cholecystectomy. 3.  Fatty infiltration of the liver. 4.  There is atherosclerosis of the thoracic aorta, the great vessels of the mediastinum and the coronary arteries, including calcified atherosclerotic plaque in the RCA, LAD, and left circumflex coronary arteries.  Atherosclerosis, including three-vessel coronary artery disease. Please note that although the presence of coronary artery calcium documents the presence of coronary artery disease, the severity of this disease and any potential stenosis cannot be assessed on this non-gated CT examination.  Assessment for potential risk factor modification, dietary therapy or pharmacologic therapy may be warranted, if clinically indicated.  Original Report Authenticated By: Rosealee Albee, M.D.    Electrocardiogram: EKG shows atrial fibrillation at 96 beats per minute. Normal axis. Intervals appear to be normal. No Q waves. No concerning ST or T-wave changes are noted.  Assessment/Plan  Principal Problem:  *Hyperkalemia Active Problems:  DIABETES MELLITUS, TYPE II  OBSTRUCTIVE SLEEP APNEA  Arteriosclerotic cardiovascular disease (ASCVD)  Atrial fibrillation  COPD (chronic obstructive pulmonary disease)  Chronic anticoagulation  Ureterolithiasis   #1 hyperkalemia: This may be due to the fact, that he is on spironolactone. No other etiology is found at this time. He'll be given Kayexalate and his potassium level will be rechecked. Spironolactone will be held for now along with his digoxin.  #2 ureterolithiasis: This is the reason for his nausea, vomiting, and abdominal pain and hematuria. He has a 5.2 mm stone in the right ureter. Hopefully, he'll pass the stone soon. If his symptoms do not improve, urology  opinion may be considered in the morning.  #3 history of atrial fibrillation on chronic anticoagulation: His heart rate is well controlled. PT/INR will be checked. Diltiazem will be continued along with the beta blocker.  #4 systolic CHF, chronic: He appears to be well compensated. His EF is notably about 35% based on echocardiogram done in February of this year. He is followed by Dr. Dietrich Pates, and plans are for consideration of AICD if his EF does not improve. It remains unclear why the patient is not on ACE inhibitors, but with the hyperkalemia, I would not initiate at this time. And, I will defer this issue to his cardiologist and his primary care physician.  #5 history of coronary artery disease, is stable.   #6 history of, diabetes. He'll be put on a sliding scale insulin. Hold his metformin due to IV contrast that was given for CT.  Is a full code. Use CPAP for his sleep apnea.  Further management decisions will depend on results of further testing and patient's response to treatment.  Dr. Delbert Harness will assume care in the morning.  [facility]  [facility] Pager [phone]  06/25/2011, 2:21 AM

## 2011-06-25 NOTE — Progress Notes (Signed)
PATIENT IS REFUSING TO WEAR CPAP HS HE STATED AND HIS FAMILY THAT HE DOESN'T WEAR IT AT HOME AND HE DOES NOT WANT TO WEAR IT HERE. RN IS AWARE

## 2011-06-25 NOTE — ED Notes (Signed)
EKG completed, pt medicated for pain, nausea and elevated K. Pt tolerated.

## 2011-06-25 NOTE — Consult Note (Signed)
[phone]

## 2011-06-25 NOTE — ED Notes (Signed)
Pt reporting improvement in pain level. Denies nausea at present time.  Pt resting with eyes closed, respirations regular even and unlabored.  Pain and nausea medication declined at this time.

## 2011-06-25 NOTE — Progress Notes (Addendum)
ANTICOAGULATION CONSULT NOTE - Initial Consult  Pharmacy Consult for  Warfarin Indication: atrial fibrillation  Allergies  Allergen Reactions  . Novocain     Patient Measurements: Height: 5' 4.5" (163.8 cm) Weight: 194 lb 3.6 oz (88.1 kg) IBW/kg (Calculated) : 60.35    Vital Signs: Temp: 98.1 F (36.7 C) (03/19 0400) Temp src: Oral (03/19 0400) BP: 105/68 mmHg (03/19 0700) Pulse Rate: 89  (03/19 0700)  Labs:  Basename 06/25/11 0306 06/24/11 2131  HGB -- 16.1  HCT -- 46.0  PLT -- 176  APTT -- --  LABPROT 25.7* --  INR 2.30* --  HEPARINUNFRC -- --  CREATININE -- 0.98  CKTOTAL -- --  CKMB -- --  TROPONINI -- --   Estimated Creatinine Clearance: 66.9 ml/min (by C-G formula based on Cr of 0.98).  Medical History: Past Medical History  Diagnosis Date  . Arteriosclerotic cardiovascular disease (ASCVD)   . Diabetes mellitus   . Atrial fibrillation     Onset in 2012  . COPD (chronic obstructive pulmonary disease)   . Gout   . DJD (degenerative joint disease)   . Hyperlipidemia   . Obstructive sleep apnea   . Chronic anticoagulation 2012    2012  . Myocardial infarction   . CHF (congestive heart failure)     Medications:  Scheduled:    . carvedilol  12.5 mg Oral BID WC  . ciprofloxacin  400 mg Intravenous Q12H  . diltiazem  180 mg Oral Daily  . furosemide  40 mg Oral Daily  . HYDROmorphone  1 mg Intravenous Once  . HYDROmorphone  1 mg Intravenous Once  . HYDROmorphone  1 mg Intravenous Once  . insulin aspart  0-15 Units Subcutaneous TID WC  . magnesium oxide  400 mg Oral BID  . ondansetron  4 mg Intravenous Once  . ondansetron  4 mg Intravenous Once  . simvastatin  20 mg Oral q1800  . sodium chloride  250 mL Intravenous Once  . sodium chloride  3 mL Intravenous Q12H  . sodium polystyrene  15 g Oral Once  . Tamsulosin HCl  0.4 mg Oral Daily  . warfarin  2.5 mg Oral ONCE-1800  . Warfarin - Pharmacist Dosing Inpatient   Does not apply q1800  .  DISCONTD: sodium chloride   Intravenous STAT  . DISCONTD: sodium polystyrene  15 g Oral Once    Assessment:  Therapeutic INR. Continuation of home therapy. Home dose is a three day cycle of 5 mg-2.5 mg-2.5 mg. Blood in urine noted on admission. RN to clarify later today if Warfarin to be continued (proceed per Dr. Janna Arch).  Goal of Therapy:  INR 2-3   Plan:  Warfarin 2.5 mg today. Daily INR.  Caryl Asp 06/25/2011,8:44 AM

## 2011-06-25 NOTE — ED Notes (Signed)
Attempted to call report to RN on admitting unit.  Nurse unavailable.  Left message.

## 2011-06-25 NOTE — Progress Notes (Signed)
   CARE MANAGEMENT NOTE 06/25/2011  Patient:  [name], [name]   Account Number:  [id number]  Date Initiated:  06/25/2011  Documentation initiated by:  Sharrie Rothman  Subjective/Objective Assessment:   Pt admitted from home with hyperkalemia and kidney stones. Pt lives with wife and will return home at discharge.     Action/Plan:   Pt has no CM needs at this time. Urology consult.   Anticipated DC Date:  07/02/2011   Anticipated DC Plan:  HOME/SELF CARE      DC Planning Services  CM consult      Choice offered to / List presented to:             Status of service:  Completed, signed off Medicare Important Message given?   (If response is "NO", the following Medicare IM given date fields will be blank) Date Medicare IM given:   Date Additional Medicare IM given:    Discharge Disposition:  HOME/SELF CARE  Per UR Regulation:    If discussed at Long Length of Stay Meetings, dates discussed:    Comments:  06/25/11 1634 Arlyss Queen, RN BSN Cm Pt admitted from home with hyperkalemia and kidney stones. Pt has no CM needs.

## 2011-06-25 NOTE — Progress Notes (Signed)
Patient is refusing to wear cpap mask and bp cuff.  States "I don't want to wear anything in here, none of this stuff".  Patient currently agrees to wear monitor for heart but will not/refuses to wear anything else.

## 2011-06-26 LAB — BASIC METABOLIC PANEL
Calcium: 8.8 mg/dL (ref 8.4–10.5)
GFR calc Af Amer: 45 mL/min — ABNORMAL LOW (ref >90)
GFR calc non Af Amer: 39 mL/min — ABNORMAL LOW (ref >90)
Potassium: 3.6 mEq/L (ref 3.5–5.1)
Sodium: 131 mEq/L — ABNORMAL LOW (ref 135–145)

## 2011-06-26 LAB — GLUCOSE, CAPILLARY
Glucose-Capillary: 173 mg/dL — ABNORMAL HIGH (ref 70–99)
Glucose-Capillary: 222 mg/dL — ABNORMAL HIGH (ref 70–99)

## 2011-06-26 LAB — PROTIME-INR
INR: 2.03 — ABNORMAL HIGH (ref 0.00–1.49)
Prothrombin Time: 23.3 seconds — ABNORMAL HIGH (ref 11.6–15.2)

## 2011-06-26 NOTE — Progress Notes (Signed)
[phone]

## 2011-06-26 NOTE — Consult Note (Signed)
NAMELYAN, MOYANO                  ACCOUNT NO.:  [id number]  MEDICAL RECORD NO.:  [id number]  LOCATION:  IC01                          FACILITY:  [facility]  PHYSICIAN:  Ky Barban, M.D.DATE OF BIRTH:  1934/06/29  DATE OF CONSULTATION: DATE OF DISCHARGE:                                CONSULTATION   CHIEF COMPLAINT:  Gross hematuria, suprapubic pain, nausea, vomiting.  [name] is a 77 year old gentleman, who presented to the emergency room with severe pain in the suprapubic area.  He has other problems, history of having congestive heart failure, atrial fibrillation, diabetes, coronary artery disease, but the pain was in the suprapubic area.  It did not radiate.  Later on, it radiated in the right side.  He also started to have gross hematuria on and off.  He is not having any hematuria or any pain today.  CT scan was done, it showed that he has distal right ureteral calculus, 5.2 mm and also having smaller bilateral renal calculi.  The patient is admitted for further management.  He is taking several medications.  He was taking tablets for his diabetes, but he has been started on insulin coverage.  His past medical history is significant.  He has a history of cardiovascular atherosclerotic disease, diabetes, atrial fibrillation. He also has COPD, gout, degenerative joint disease, hyperlipidemia, obstructive sleep apnea.  He is on chronic anticoagulation.  SURGICAL HISTORY:  Include cholecystectomy, knee surgery, and vasectomy.  FAMILY HISTORY:  No history of prostate cancer.  SOCIAL HISTORY:  He quit smoking 19 years ago.  He does not drink any alcohol or take any illicit drugs.  REVIEW OF SYSTEMS:  Otherwise unremarkable.  PHYSICAL EXAMINATION:  GENERAL:  Moderately built and obese male, not in acute distress. VITAL SIGNS:  Blood pressure 135/83, temperature is normal. ABDOMEN:  Soft.  Liver, spleen, and kidneys are not palpable. EXTERNAL GENITALIA:  Circumcised,  meatus adequate.  Testicles are normal. RECTAL:  Deferred. EXTREMITIES:  Normal.  LABORATORY DATA:  Sodium 133, potassium 5.5, chloride 93, CO2 is 24, glucose 260, BUN is 19, creatinine 0.98.  WBC count is 13.2, hematocrit is 46%.  Urinalysis shows nitrite negative, does have a large amount of blood in it.  IMPRESSION:  Distal right ureteral calculus, bilateral small renal calculi, diabetes, cardiovascular atherosclerotic disease, atrial fibrillation, chronic obstructive pulmonary disease.  I think I want to go ahead and do stone basket.  Procedure limitation and complication discussed briefly with the patient and his wife.  I told them if they have a question, they can come in the morning in my office to talk about it.  I will schedule it for Friday.  In the mean time, I want to see if his Coumadin can be discontinued.  His INR is 2.5.  I appreciate Dr. Janna Arch.     Ky Barban, M.D.     MIJ/MEDQ  D:  06/25/2011  T:  06/26/2011  Job:  [id number]  cc:   Melvyn Novas, MD Fax: [phone]

## 2011-06-26 NOTE — Progress Notes (Signed)
DR JAVAID IN TO TALK WITH PT AND HIS WIFE ABOUT PLANNED SURGERY IN AM TO REMOVE KIDNEY STONE. PROCEDURE AND POSSIBLE COMPLICATIONS REVIEWED. PT AND WIFE UNDERSTOOD.  PT WILL BE NPO AFTER MN AND WILL HAVE A PT/INR DRAWN AT 0900 06/27/11. RESULTS TO BE CALLED TO DR JAVAID IN OR.

## 2011-06-26 NOTE — Progress Notes (Signed)
[name], [name]                  ACCOUNT NO.:  [id number]  MEDICAL RECORD NO.:  [id number]  LOCATION:                                 FACILITY:  PHYSICIAN:  Melvyn Novas, MDDATE OF BIRTH:  [birth year]  DATE OF PROCEDURE: DATE OF DISCHARGE:                                PROGRESS NOTE   The patient has chronic atrial fibrillation, anticoagulation on Coumadin, ischemic cardiomyopathy, ejection fraction 40-45%, hyperlipidemia, insulin-dependent diabetes, BPH.  The patient presented with hematuria last night, RBCs TNTC and right ureteral 5 mm calculus, pain, nausea, vomiting, so forth currently.  Pain is controlled.  INR is 2.3.  The patient was placed on Cipro 400 IV q.12h.  On admission, he also had hyperkalemia, currently potassium 4.4, WBCs 13.4, hemoglobin 15.5, creatinine 1.49.  PHYSICAL EXAMINATION:  NECK:  Shows no JVD. LUNGS:  Clear to A and P.  No rales, wheeze, or rhonchi. HEART:  Regular rhythm.  No S3 or S4. ABDOMEN:  Benign.  Plan right now is to continue Cipro, monitor PT/INR daily per Coumadin per pharmacy consult, obtain Urology consult for possible lithotripsy or surgical intervention if they deem appropriate or necessary.  Continue IV fluids.  The patient was on spironolactone, currently potassium is fine.  We will not add any further orders.     Melvyn Novas, MD     RMD/MEDQ  D:  06/25/2011  T:  06/25/2011  Job:  [id number]

## 2011-06-27 LAB — GLUCOSE, CAPILLARY
Glucose-Capillary: 248 mg/dL — ABNORMAL HIGH (ref 70–99)
Glucose-Capillary: 215 mg/dL — ABNORMAL HIGH (ref 70–99)

## 2011-06-27 LAB — PROTIME-INR
Prothrombin Time: 20.8 seconds — ABNORMAL HIGH (ref 11.6–15.2)
Prothrombin Time: 19.9 seconds — ABNORMAL HIGH (ref 11.6–15.2)

## 2011-06-27 LAB — BASIC METABOLIC PANEL
BUN: 21 mg/dL (ref 6–23)
Creatinine, Ser: 1.82 mg/dL — ABNORMAL HIGH (ref 0.50–1.35)
GFR calc Af Amer: 40 mL/min — ABNORMAL LOW (ref >90)
GFR calc non Af Amer: 35 mL/min — ABNORMAL LOW (ref >90)

## 2011-06-27 MED ORDER — SODIUM CHLORIDE 0.9 % IJ SOLN
INTRAMUSCULAR | Status: AC
  Administered 2011-06-27: 10 mL
  Filled 2011-06-27: qty 3

## 2011-06-27 MED ORDER — VITAMIN K1 10 MG/ML IJ SOLN: 10.0000 mg | Freq: Once | INTRAMUSCULAR | Status: DC

## 2011-06-27 MED ORDER — POTASSIUM CHLORIDE 10 MEQ/100ML IV SOLN
INTRAVENOUS | Status: AC
  Administered 2011-06-27: 10 meq via INTRAVENOUS
  Filled 2011-06-27: qty 400

## 2011-06-27 MED ORDER — POTASSIUM CHLORIDE 10 MEQ/100ML IV SOLN
10.0000 meq | INTRAVENOUS | Status: AC
  Administered 2011-06-27 (×4): 10 meq via INTRAVENOUS

## 2011-06-27 MED ORDER — VITAMIN K1 10 MG/ML IJ SOLN
1.0000 mg | Freq: Once | INTRAMUSCULAR | Status: AC
  Administered 2011-06-27: 1 mg via SUBCUTANEOUS
  Filled 2011-06-27: qty 1

## 2011-06-27 MED ORDER — VITAMIN K1 1 MG/0.5ML IJ SOLN
1.0000 mg | Freq: Once | INTRAMUSCULAR | Status: DC
  Filled 2011-06-27: qty 0.5

## 2011-06-27 NOTE — Progress Notes (Signed)
[phone]

## 2011-06-27 NOTE — Progress Notes (Signed)
UR Chart Review Completed  

## 2011-06-27 NOTE — Progress Notes (Signed)
Pt transferred to room 330. Report called to [facility] RN on 300.the patient ALERT AND ORIENTED. IV IN RT HAND AND AC BOTH PATENT. DENIES ANY PAIN. PT NOW FOR STONE EXTRACTION TOMORROW 06/28/11 AT 1300.

## 2011-06-27 NOTE — Progress Notes (Signed)
ANTICOAGULATION CONSULT NOTE   Pharmacy Consult for  Warfarin Indication: atrial fibrillation  Allergies  Allergen Reactions  . Novocain    Patient Measurements: Height: 5' 4.5" (163.8 cm) Weight: 194 lb 3.6 oz (88.1 kg) IBW/kg (Calculated) : 60.35    Vital Signs: Temp: 97.5 F (36.4 C) (03/21 0747) Temp src: Oral (03/21 0747) Pulse Rate: 123  (03/21 0800)  Labs:  [name] 06/27/11 0901 06/27/11 0526 06/26/11 0414 06/25/11 1125 06/24/11 2131  HGB -- -- -- 15.5 16.1  HCT -- -- -- 44.0 46.0  PLT -- -- -- 155 176  APTT -- 35 -- -- --  LABPROT 19.9* 20.8* 23.3* -- --  INR 1.66* 1.76* 2.03* -- --  HEPARINUNFRC -- -- -- -- --  CREATININE -- 1.82* 1.67* 1.49* --  CKTOTAL -- -- -- -- --  CKMB -- -- -- -- --  TROPONINI -- -- -- -- --   Estimated Creatinine Clearance: 36 ml/min (by C-G formula based on Cr of 1.82).  Medical History: Past Medical History  Diagnosis Date  . Arteriosclerotic cardiovascular disease (ASCVD)   . Diabetes mellitus   . Atrial fibrillation     Onset in 2012  . COPD (chronic obstructive pulmonary disease)   . Gout   . DJD (degenerative joint disease)   . Hyperlipidemia   . Obstructive sleep apnea   . Chronic anticoagulation 2012    2012  . Myocardial infarction   . CHF (congestive heart failure)    Medications:  Scheduled:     . atorvastatin  10 mg Oral q1800  . carvedilol  12.5 mg Oral BID WC  . ciprofloxacin  400 mg Intravenous Q12H  . diltiazem  180 mg Oral Daily  . furosemide  40 mg Oral Daily  . insulin aspart  0-15 Units Subcutaneous TID WC  . magnesium oxide  400 mg Oral BID  . phytonadione  1 mg Subcutaneous Once  . potassium chloride  10 mEq Intravenous Q1 Hr x 4  . sodium chloride  3 mL Intravenous Q12H  . Tamsulosin HCl  0.4 mg Oral Daily  . DISCONTD: phytonadione  1 mg Subcutaneous Once  . DISCONTD: phytonadione  10 mg Subcutaneous Once   Assessment: Coumadin currently on hold in anticipation of surgical procedure on  Friday per Urology.  INR trending down and pt given Vitamin K per MD  Goal of Therapy:  INR 2-3   Plan: Hold coumadin until after procedure.  Will await instructions from MD to resume. Daily INR.  Margo Aye, Zamiah Tollett A 06/27/2011,10:24 AM

## 2011-06-27 NOTE — Progress Notes (Signed)
Pt not wearing cpap

## 2011-06-27 NOTE — Progress Notes (Signed)
[id number] 

## 2011-06-27 NOTE — Progress Notes (Signed)
NAMELAVONTE, PALOS                  ACCOUNT NO.:  [id number]  MEDICAL RECORD NO.:  [id number]  LOCATION:  IC01                          FACILITY:  [facility]  PHYSICIAN:  Melvyn Novas, MDDATE OF BIRTH:  [birth year]  DATE OF PROCEDURE: DATE OF DISCHARGE:                                PROGRESS NOTE   The patient is status post surgical procedure for removal of right ureteral stone, renal colic.  VITAL SIGNS:  Blood pressure is 109/80, temperature 97.8, pulse 73 and regular, respiratory rate is 13 LUNGS:  Clear.  No rales, wheezes, or rhonchi. HEART:  Regular rate and rhythm.  No murmurs, gallops, or rubs. ABDOMEN:  Benign.  Potassium down today 3.0.  INR 1.76  PLAN:  Right now is to give KCl 10 mEq x4 runs.  Give vitamin K 2 mg supplementally right now subcu and proceed with surgical procedure after correction of electrolyte abnormalities.     Melvyn Novas, MD     RMD/MEDQ  D:  06/27/2011  T:  06/27/2011  Job:  [id number]

## 2011-06-27 NOTE — Progress Notes (Signed)
Inpatient Diabetes Program Recommendations  AACE/ADA: New Consensus Statement on Inpatient Glycemic Control  Target Ranges:  Prepandial:   less than 140 mg/dL      Peak postprandial:   less than 180 mg/dL (1-2 hours)      Critically ill patients:  140 - 180 mg/dL  Pager:  [phone] Hours:  8 am-10pm   Reason for Visit: Elevated glucose  Results for [name], [name] (MRN [id number]) as of 06/27/2011 08:52  Ref. Range 06/26/2011 07:50 06/26/2011 11:20 06/26/2011 16:33 06/26/2011 21:11  Glucose-Capillary Latest Range: 70-99 mg/dL 564 (H) 332 (H) 951 (H) 222 (H)    Inpatient Diabetes Program Recommendations Correction (SSI): Increase to Resistant Novolog correction q4hrs

## 2011-06-27 NOTE — Progress Notes (Signed)
RN CALLED OR. SURGERY HAS BEEN CANCELED UNTIL TOMORROM AT 1300.PT AND FAMILY IMFORMED. PT IS VERY UPSET, BUT COOPERATIVE.

## 2011-06-27 NOTE — Progress Notes (Signed)
[name], [name]                  ACCOUNT NO.:  [id number]  MEDICAL RECORD NO.:  [id number]  LOCATION:  IC01                          FACILITY:  [facility]  PHYSICIAN:  Ky Barban, M.D.DATE OF BIRTH:  [birth year]  DATE OF PROCEDURE: DATE OF DISCHARGE:                                PROGRESS NOTE   [name] is doing well.  Still having pain and I talked to Dr. Janna Arch.  He already stopped his Coumadin and we are going to repeat his protime in the morning.  If it is elevated, then I would not be able to do stone basket.  We have to do it next day, but we will see what his protime is.  I have told this to the patient.  He should be n.p.o. after midnight.     Ky Barban, M.D.     MIJ/MEDQ  D:  06/26/2011  T:  06/27/2011  Job:  [id number]

## 2011-06-27 NOTE — Progress Notes (Signed)
[name], [name]                  ACCOUNT NO.:  [id number]  MEDICAL RECORD NO.:  [id number]  LOCATION:                                 FACILITY:  PHYSICIAN:  Melvyn Novas, MDDATE OF BIRTH:  09-19-1934  DATE OF PROCEDURE: DATE OF DISCHARGE:                                PROGRESS NOTE   The patient has right ureteral calculus, renal colic, was anticoagulated on Coumadin for atrial fibrillation.  Coumadin has been held in the plans of doing a stone basket retrieval if the patient has not passed stone within the next 48 hours.  PHYSICAL EXAMINATION:  VITAL SIGNS:  Blood pressure 128/80, pulse is 70 and irregularly irregular.  No S3 gallop auscultated. LUNGS:  Clear to A and P.  No rales, wheeze, rhonchi. HEART:  Unremarkable. ABDOMEN:  Essentially benign.  PLAN:  Right now is to continue Cipro, hold Coumadin, and await urologic intervention if necessary.     Melvyn Novas, MD     RMD/MEDQ  D:  06/26/2011  T:  06/26/2011  Job:  [id number]

## 2011-06-28 ENCOUNTER — Encounter (HOSPITAL_COMMUNITY): Payer: Self-pay | Admitting: Anesthesiology

## 2011-06-28 ENCOUNTER — Encounter (HOSPITAL_COMMUNITY)
Admission: EM | Disposition: A | Discharge status: Home / Self Care | Payer: Self-pay | Source: Home / Self Care | Attending: Family Medicine

## 2011-06-28 ENCOUNTER — Encounter (HOSPITAL_COMMUNITY): Payer: Self-pay | Admitting: *Deleted

## 2011-06-28 ENCOUNTER — Inpatient Hospital Stay (HOSPITAL_COMMUNITY): Payer: Medicare Other | Admitting: Anesthesiology

## 2011-06-28 ENCOUNTER — Inpatient Hospital Stay (HOSPITAL_COMMUNITY): Payer: Medicare Other

## 2011-06-28 HISTORY — PX: CYSTOSCOPY/RETROGRADE/URETEROSCOPY: SHX5316

## 2011-06-28 HISTORY — PX: STONE EXTRACTION WITH BASKET: SHX5318

## 2011-06-28 LAB — BASIC METABOLIC PANEL
Calcium: 8.9 mg/dL (ref 8.4–10.5)
Creatinine, Ser: 2.03 mg/dL — ABNORMAL HIGH (ref 0.50–1.35)
GFR calc Af Amer: 35 mL/min — ABNORMAL LOW (ref >90)
GFR calc non Af Amer: 31 mL/min — ABNORMAL LOW (ref >90)
Sodium: 134 mEq/L — ABNORMAL LOW (ref 135–145)

## 2011-06-28 LAB — GLUCOSE, CAPILLARY: Glucose-Capillary: 284 mg/dL — ABNORMAL HIGH (ref 70–99)

## 2011-06-28 LAB — PROTIME-INR
INR: 1.25 (ref 0.00–1.49)
Prothrombin Time: 16 seconds — ABNORMAL HIGH (ref 11.6–15.2)

## 2011-06-28 SURGERY — CYSTOSCOPY/RETROGRADE/URETEROSCOPY
Anesthesia: Spinal | Site: Ureter | Laterality: Right | Wound class: Clean Contaminated

## 2011-06-28 MED ORDER — PROPOFOL 10 MG/ML IV EMUL
INTRAVENOUS | Status: DC | PRN
  Administered 2011-06-28: 20 ug/kg/min via INTRAVENOUS

## 2011-06-28 MED ORDER — SODIUM CHLORIDE 0.9 % IR SOLN
Status: DC | PRN
  Administered 2011-06-28: 3000 mL

## 2011-06-28 MED ORDER — MIDAZOLAM HCL 2 MG/2ML IJ SOLN
1.0000 mg | INTRAMUSCULAR | Status: DC | PRN
  Administered 2011-06-28: 2 mg via INTRAVENOUS

## 2011-06-28 MED ORDER — FENTANYL CITRATE 0.05 MG/ML IJ SOLN: 25.0000 ug | INTRAMUSCULAR | Status: DC | PRN

## 2011-06-28 MED ORDER — FENTANYL CITRATE 0.05 MG/ML IJ SOLN
INTRAMUSCULAR | Status: DC | PRN
  Administered 2011-06-28: 30 ug via INTRAVENOUS
  Administered 2011-06-28: 20 ug via INTRATHECAL
  Administered 2011-06-28: 50 ug via INTRAVENOUS

## 2011-06-28 MED ORDER — SODIUM CHLORIDE 0.9 % IJ SOLN
INTRAMUSCULAR | Status: AC
  Filled 2011-06-28: qty 3

## 2011-06-28 MED ORDER — BUPIVACAINE IN DEXTROSE 0.75-8.25 % IT SOLN
INTRATHECAL | Status: AC
  Filled 2011-06-28: qty 2

## 2011-06-28 MED ORDER — EPHEDRINE SULFATE 50 MG/ML IJ SOLN
INTRAMUSCULAR | Status: AC
  Filled 2011-06-28: qty 1

## 2011-06-28 MED ORDER — MIDAZOLAM HCL 2 MG/2ML IJ SOLN
INTRAMUSCULAR | Status: AC
  Administered 2011-06-28: 2 mg via INTRAVENOUS
  Filled 2011-06-28: qty 2

## 2011-06-28 MED ORDER — BUPIVACAINE HCL 0.75 % IJ SOLN
INTRAMUSCULAR | Status: DC | PRN
  Administered 2011-06-28: 13 mg via INTRATHECAL

## 2011-06-28 MED ORDER — LACTATED RINGERS IV SOLN
INTRAVENOUS | Status: DC
  Administered 2011-06-28: 1000 mL via INTRAVENOUS
  Administered 2011-06-28: 16:00:00 via INTRAVENOUS

## 2011-06-28 MED ORDER — IOHEXOL 350 MG/ML SOLN
INTRAVENOUS | Status: DC | PRN
  Administered 2011-06-28: 50 mL

## 2011-06-28 MED ORDER — FENTANYL CITRATE 0.05 MG/ML IJ SOLN
INTRAMUSCULAR | Status: AC
  Filled 2011-06-28: qty 2

## 2011-06-28 MED ORDER — EPHEDRINE SULFATE 50 MG/ML IJ SOLN
INTRAMUSCULAR | Status: DC | PRN
  Administered 2011-06-28 (×5): 5 mg via INTRAVENOUS

## 2011-06-28 MED ORDER — LIDOCAINE HCL (PF) 1 % IJ SOLN
INTRAMUSCULAR | Status: AC
  Filled 2011-06-28: qty 5

## 2011-06-28 MED ORDER — ACETAMINOPHEN 325 MG PO TABS: 325.0000 mg | ORAL_TABLET | ORAL | Status: DC | PRN

## 2011-06-28 MED ORDER — ONDANSETRON HCL 4 MG/2ML IJ SOLN
4.0000 mg | Freq: Once | INTRAMUSCULAR | Status: AC | PRN
  Filled 2011-06-28: qty 2

## 2011-06-28 MED ORDER — PROPOFOL 10 MG/ML IV EMUL
INTRAVENOUS | Status: AC
  Filled 2011-06-28: qty 20

## 2011-06-28 SURGICAL SUPPLY — 29 items
BAG DRAIN URO TABLE W/ADPT NS (DRAPE) ×3 IMPLANT
BAG DRN 8 ADPR NS SKTRN CSTL (DRAPE) ×2
CATH 5 FR WEDGE TIP (UROLOGICAL SUPPLIES) ×3 IMPLANT
CATH OPEN TIP 5FR (CATHETERS) ×2 IMPLANT
CLOTH BEACON ORANGE TIMEOUT ST (SAFETY) ×3 IMPLANT
DECANTER SPIKE VIAL GLASS SM (MISCELLANEOUS) ×1 IMPLANT
DILATOR BALLN URETERAL SET (BALLOONS) IMPLANT
DILATOR UROMAX ULTRA (MISCELLANEOUS) ×2 IMPLANT
FLOOR PAD 36X40 (MISCELLANEOUS)
GLOVE BIO SURGEON STRL SZ7 (GLOVE) ×3 IMPLANT
GLOVE BIOGEL PI IND STRL 7.0 (GLOVE) ×2 IMPLANT
GLOVE BIOGEL PI INDICATOR 7.0 (GLOVE) ×2
GLOVE ECLIPSE 6.5 STRL STRAW (GLOVE) ×2 IMPLANT
GLOVE EXAM NITRILE MD LF STRL (GLOVE) ×2 IMPLANT
GOWN STRL REIN XL XLG (GOWN DISPOSABLE) ×5 IMPLANT
IV NS IRRIG 3000ML ARTHROMATIC (IV SOLUTION) ×8 IMPLANT
KIT ROOM TURNOVER AP CYSTO (KITS) ×3 IMPLANT
LASER FIBER DISP (UROLOGICAL SUPPLIES) IMPLANT
LASER FIBER DISP 1000U (UROLOGICAL SUPPLIES) IMPLANT
MANIFOLD NEPTUNE II (INSTRUMENTS) ×3 IMPLANT
PACK CYSTO (CUSTOM PROCEDURE TRAY) ×3 IMPLANT
PAD ARMBOARD 7.5X6 YLW CONV (MISCELLANEOUS) ×3 IMPLANT
PAD FLOOR 36X40 (MISCELLANEOUS) ×1 IMPLANT
STENT PERCUFLEX 4.8FRX24 (STENTS) ×2 IMPLANT
STONE RETRIEVAL GEMINI 2.4 FR (MISCELLANEOUS) ×3 IMPLANT
TAPE CLOTH SILK CARING 3INX10 (GAUZE/BANDAGES/DRESSINGS) ×2 IMPLANT
TOWEL OR 17X26 4PK STRL BLUE (TOWEL DISPOSABLE) ×3 IMPLANT
WATER STERILE IRR 1000ML POUR (IV SOLUTION) ×3 IMPLANT
WIRE GUIDE BENTSON .035 15CM (WIRE) ×3 IMPLANT

## 2011-06-28 NOTE — Clinical Documentation Improvement (Signed)
RENAL FAILURE DOCUMENTATION CLARIFICATION QUERY  THIS DOCUMENT IS NOT A PERMANENT PART OF THE MEDICAL RECORD  TO RESPOND TO THE THIS QUERY, FOLLOW THE INSTRUCTIONS BELOW:  1. If needed, update documentation for the patient's encounter via the notes activity.  2. Access this query again and click edit on the In [employer].  3. After updating, or not, click F2 to complete all highlighted (required) fields concerning your review. Select "additional documentation in the medical record" OR "no additional documentation provided".  4. Click Sign note button.  5. The deficiency will fall out of your In Basket *Please let us know if you are not able to complete this workflow by phone or e-mail (listed below).  Please update your documentation within the medical record to reflect your response to this query.                                                                                    06/28/11  Dear Dr. Janna Arch,  In a better effort to capture your patient's severity of illness, reflect appropriate length of stay and utilization of resources, a review of the patient medical record has revealed the following indicators.  Please clarify and document in a progress note and/or discharge summary the clinical condition associated with the following supporting information. Please exercise your independent judgment.  The fact that a query is asked, does not imply that any particular answer is desired or expected.  Possible Clinical Conditions?  Acute Renal Failure Acute Kidney Injury Acute Tubular Necrosis Acute on Chronic Renal Failure Chronic Renal Failure Other Condition_____________ Cannot Clinically Determine   Clinical Information:  Risk Factors: Ureterolithiasis  Signs and Symptoms: Abd pain hematuria  Diagnostics: Component   Latest Ref Rng  06/24/2011  06/25/2011  06/26/2011  06/27/2011  06/28/2011 BUN    6 - 23 mg/dL   19   19   19    21   26  (H)   Creat    0.50 - 1.35 mg/dL  1.61    0.96 (H)  0.45 (H) 1.82 (H)  2.03 (H) 26 (H)  GFR calc non Af Amer >90 mL/min   79 (L)   44 (L)   39 (L)  35 (L)   31 (L)   Urinalysis  Component   Latest Ref Rng  06/24/2011  Color, Urine   YELLOW   BROWN (A)  Appearance   CLEAR   CLOUDY (A)  Specific Gravity, Urine 1.005 - 1.030   >1.030 (H)  PH    5.0 - 8.0   5.5  Glucose, UA   NEGATIVE mg/dL  NEGATIVE  Hgb urine dipstick  NEGATIVE   LARGE (A)  Bilirubin Urine   NEGATIVE   SMALL (A)  Ketones, ur   NEGATIVE mg/dL  40 (A)  Protein    NEGATIVE mg/dL  30 (A)  Urobilinogen, UA  0.0 - 1.0 mg/dL  0.2  Nitrite    NEGATIVE   NEGATIVE  Leukocytes, UA  NEGATIVE   NEGATIVE   Treatments: Urology consult Scheduled for basket extraction                    You  may use possible, probable, or suspect with inpatient documentation. possible, probable, suspected diagnoses MUST be documented at the time of discharge  Reviewed: No response by MD  Thank Bonita Quin,  Harless Litten RN, MSN Clinical Documentation Specialist: Office# [phone] [facility] Information Management [address]

## 2011-06-28 NOTE — Transfer of Care (Signed)
Immediate Anesthesia Transfer of Care Note  Patient: [name]  Procedure(s) Performed: Procedure(s) (LRB): CYSTOSCOPY/RETROGRADE/URETEROSCOPY (Right) STONE EXTRACTION WITH BASKET (Right)  Patient Location: PACU  Anesthesia Type: Spinal  Level of Consciousness: awake, alert  and oriented  Airway & Oxygen Therapy: Patient Spontanous Breathing and Patient connected to nasal cannula oxygen  Post-op Assessment: Report given to PACU RN  Post vital signs: Reviewed and stable  Complications: No apparent anesthesia complications

## 2011-06-28 NOTE — Anesthesia Procedure Notes (Addendum)
Spinal  Patient location during procedure: OR Start time: 06/28/2011 2:26 PM Staffing CRNA/Resident: Glynn Octave E Preanesthetic Checklist Completed: patient identified, site marked, surgical consent, pre-op evaluation, timeout performed, IV checked, risks and benefits discussed and monitors and equipment checked Spinal Block Patient position: right lateral decubitus Prep: Betadine Patient monitoring: heart rate, cardiac monitor, continuous pulse ox and blood pressure Approach: right paramedian Location: L3-4 Injection technique: single-shot Needle Needle type: Pencan  Needle gauge: 24 G Needle length: 9 cm Assessment Sensory level: T8 Additional Notes ATTEMPTS:1 TRAY [id number] TRAY EXPIRATION DATE:02/2012

## 2011-06-28 NOTE — Progress Notes (Signed)
[id number] 

## 2011-06-28 NOTE — Progress Notes (Signed)
[name], [name]                  ACCOUNT NO.:  [id number]  MEDICAL RECORD NO.:  [id number]  LOCATION:  A330                          FACILITY:  [facility]  PHYSICIAN:  Melvyn Novas, MDDATE OF BIRTH:  Oct 09, 1934  DATE OF PROCEDURE:  06/28/2011 DATE OF DISCHARGE:                                PROGRESS NOTE   SUBJECTIVE:  The patient has chronic atrial fibrillation, on anticoagulation, now off Coumadin for 2-1/2 days.  INR down to 1.66 despite vitamin K 2 mg yesterday.  The patient may be scheduled for of basket retrieval of right ureteral stone, I am not sure.  The patient was given 4 runs of potassium yesterday.  We will check his BMET this morning.  OBJECTIVE:  NECK:  No JVD. LUNGS:  Clear to A and P with diminished breath sounds at bases. HEART:  Irregularly regular.  No S3 or gallop.  ASSESSMENT AND PLAN:  The patient appears hemodynamically stable. Hemoglobin 15.5.  We will check BMET this morning and daily Pro-Times.     Melvyn Novas, MD     RMD/MEDQ  D:  06/28/2011  T:  06/28/2011  Job:  [id number]

## 2011-06-28 NOTE — Progress Notes (Signed)
Patient not in need of a CPAP unit, patient has had a sleep study and worn a unit in the past. He stated after he lost a large amount of weight he has had no need for any further assistance of a CPAP unit.

## 2011-06-28 NOTE — Progress Notes (Signed)
[name], [name]                  ACCOUNT NO.:  [id number]  MEDICAL RECORD NO.:  [id number]  LOCATION:  A330                          FACILITY:  [facility]  PHYSICIAN:  Ky Barban, M.D.DATE OF BIRTH:  12/27/1934  DATE OF PROCEDURE: DATE OF DISCHARGE:                                PROGRESS NOTE   Mr. Martino clinically is doing well, still having pain.  He was given IV pain medicine early this morning.  His INR is still elevated, it is 1.66 and pro time is 19.9, so the anesthesiologist told me that he cannot give him spinal anesthesia, so we canceled his procedure today.  He is on the schedule back again for tomorrow and hopefully by tomorrow, his INR will be better.     Ky Barban, M.D.     MIJ/MEDQ  D:  06/27/2011  T:  06/28/2011  Job:  [id number]

## 2011-06-28 NOTE — Addendum Note (Signed)
Addendum  created 06/28/11 1600 by Moshe Salisbury, CRNA   Modules edited:Anesthesia Medication Administration

## 2011-06-28 NOTE — Brief Op Note (Signed)
06/24/2011 - 06/28/2011  3:53 PM  PATIENT:  [name]  77 y.o. male  PRE-OPERATIVE DIAGNOSIS:  distal right ureteral stone  POST-OPERATIVE DIAGNOSIS:  distal right ureteral stone  PROCEDURE:  Procedure(s) (LRB): CYSTOSCOPY/RETROGRADE/URETEROSCOPY (Right) STONE EXTRACTION WITH BASKET (Right)  SURGEON:  Surgeon(s) and Role:    * Ky Barban, MD - Primary  PHYSICIAN ASSISTANT:   ASSISTANTS: none   ANESTHESIA:   spinal  EBL:  Total I/O In: 1000 [I.V.:1000] Out: -   BLOOD ADMINISTERED:none  DRAINS: none   LOCAL MEDICATIONS USED:  NONE  SPECIMEN:  Source of Specimen:  I gave the stone to pt to bring it in office for analysis  DISPOSITION OF SPECIMEN:  N/A  COUNTS:  YES  TOURNIQUET:  * No tourniquets in log *  DICTATION: .Other Dictation: Dictation Number dictated but forgot to get the #  PLAN OF CARE: Admit to inpatient   PATIENT DISPOSITION:  PACU - hemodynamically stable.   Delay start of Pharmacological VTE agent (>24hrs) due to surgical blood loss or risk of bleeding

## 2011-06-28 NOTE — Addendum Note (Signed)
Addendum  created 06/28/11 1601 by Moshe Salisbury, CRNA   Modules edited:Anesthesia Medication Administration

## 2011-06-28 NOTE — Anesthesia Postprocedure Evaluation (Signed)
  Anesthesia Post-op Note  Patient: [name]  Procedure(s) Performed: Procedure(s) (LRB): CYSTOSCOPY/RETROGRADE/URETEROSCOPY (Right) STONE EXTRACTION WITH BASKET (Right)  Patient Location: PACU  Anesthesia Type: Spinal  Level of Consciousness: awake, alert  and oriented  Airway and Oxygen Therapy: Patient Spontanous Breathing and Patient connected to nasal cannula oxygen  Post-op Pain: none  Post-op Assessment: Post-op Vital signs reviewed, Patient's Cardiovascular Status Stable, Respiratory Function Stable, Patent Airway and No signs of Nausea or vomiting  Post-op Vital Signs: Reviewed and stable  Complications: No apparent anesthesia complications T-12

## 2011-06-28 NOTE — Anesthesia Preprocedure Evaluation (Signed)
Anesthesia Evaluation  Patient identified by MRN, date of birth, ID band Patient awake    Reviewed: Allergy & Precautions, H&P , NPO status , Patient's Chart, lab work & pertinent test results, reviewed documented beta blocker date and time   Airway Mallampati: II TM Distance: >3 FB Neck ROM: Full    Dental No notable dental hx.    Pulmonary sleep apnea , COPD   Pulmonary exam normal       Cardiovascular + Past MI and +CHF + dysrhythmias Atrial Fibrillation Rhythm:Irregular Rate:Normal     Neuro/Psych negative neurological ROS  negative psych ROS   GI/Hepatic   Endo/Other  Diabetes mellitus-, Well Controlled, Type 2, Insulin Dependent  Renal/GU Renal InsufficiencyRenal disease     Musculoskeletal  (+) Arthritis -, Osteoarthritis,    Abdominal Normal abdominal exam  (+)   Peds  Hematology negative hematology ROS (+)   Anesthesia Other Findings   Reproductive/Obstetrics                           Anesthesia Physical Anesthesia Plan  ASA: III  Anesthesia Plan: Spinal   Post-op Pain Management:    Induction: Intravenous  Airway Management Planned: Nasal Cannula  Additional Equipment:   Intra-op Plan:   Post-operative Plan:   Informed Consent: I have reviewed the patients History and Physical, chart, labs and discussed the procedure including the risks, benefits and alternatives for the proposed anesthesia with the patient or authorized representative who has indicated his/her understanding and acceptance.     Plan Discussed with: CRNA  Anesthesia Plan Comments:         Anesthesia Quick Evaluation

## 2011-06-29 LAB — PROTIME-INR: INR: 1.2 (ref 0.00–1.49)

## 2011-06-29 LAB — GLUCOSE, CAPILLARY: Glucose-Capillary: 177 mg/dL — ABNORMAL HIGH (ref 70–99)

## 2011-06-29 NOTE — Addendum Note (Signed)
Addendum  created 06/29/11 1159 by Franco Nones, CRNA   Modules edited:Notes Section

## 2011-06-29 NOTE — Op Note (Signed)
[name], DAFT                  ACCOUNT NO.:  [id number]  MEDICAL RECORD NO.:  [id number]  LOCATION:  A330                          FACILITY:  [facility]  PHYSICIAN:  Ky Barban, M.D.DATE OF BIRTH:  10/31/1934  DATE OF PROCEDURE:  06/28/2011 DATE OF DISCHARGE:                              OPERATIVE REPORT   PREOPERATIVE DIAGNOSIS:  Right distal ureteral calculus.  POSTOPERATIVE DIAGNOSIS:  Right distal ureteral calculus.  PROCEDURE:  Cystoscopy, right retrograde pyelogram, ureteroscopic stone basket extraction, insertion of double-J stent size 5-French 24 cm with string attached.  ANESTHESIA:  Spinal.  DESCRIPTION OF PROCEDURE:  The patient under spinal anesthesia, in lithotomy position, after usual prep and drape, a #25 cystoscope introduced into the bladder.  Right ureteral orifice was catheterized with a wedge catheter.  Hypaque was injected under fluoroscopic control. The dye goes up into the upper ureter and the renal pelvis.  In the distal ureter, I did not see any filling defect, but I was not sure. Ureter was slightly dilated, so I decided to take a look.  A guidewire was passed up into the renal pelvis.  Over the guidewire, 15 balloon was introduced and intramural ureter was dilated.  Once the intramural ureter was dilated, I used ureteroscope short rigid that was introduced and I can see the stone in the right ureterovesical junction.  A stone basket was introduced under direct vision, and the stone was extracted without any difficulty.  Then, I inserted the ureteroscope, looked into the distal part of the ureter, it looks fine.  There was no residual stone.  I decided to leave a double-J stent, so the ureteroscope was removed, and guidewire was back fed into the cystoscope and a 5-French 24-cm double-J stent was positioned between the renal pelvis and the bladder.  The nice loop was obtained in the renal pelvis and the bladder.  String was taped to his private  area. All the instruments were removed.  The patient left the operating room in satisfactory condition.     Ky Barban, M.D.     MIJ/MEDQ  D:  06/28/2011  T:  06/29/2011  Job:  [id number]  cc:   Melvyn Novas, MD Fax: [phone]

## 2011-06-29 NOTE — Discharge Summary (Signed)
[name], [name]                  ACCOUNT NO.:  [id number]  MEDICAL RECORD NO.:  [id number]  LOCATION:  A330                          FACILITY:  [facility]  PHYSICIAN:  Melvyn Novas, MDDATE OF BIRTH:  [birth year]  DATE OF ADMISSION:  06/24/2011 DATE OF DISCHARGE:  LH                              DISCHARGE SUMMARY   The patient is a 77 year old white male with chronic atrial fibrillation on anticoagulation, hypertension, hyperlipidemia, and type 2 non-insulin- dependent diabetes, who was admitted with a right ureteral stone, renal colic.  He was given analgesia, fluids and he was failed to pass the stones.  Seen in consultation by Urology, who we decided to stop Coumadin.  On the 3rd hospital day, he was went for surgical procedure for balloon extraction of the right ureteral stone.  This seemed to go well without complications.  Bilateral retrograde pyelogram which revealed good flow anterograde and retrograde.  The patient was essentially asymptomatic.  He was told to resume his Coumadin and he was subsequently discharged on the following medicines: 1. Carvedilol 12.5 mg p.o. b.i.d. 2. Digoxin 0.25 mg p.o. daily. 3. Cardizem CD 180 p.o. daily. 4. Lasix 40 mg p.o. daily. 5. Magnesium oxide 400 mg p.o. daily. 6. Glucophage 500 mg p.o. b.i.d. 7. Omega-3 1000 mg p.o. b.i.d. 8. Pravachol 40 mg p.o. daily 9. Spironolactone 25 mg p.o. daily. 10.Flomax 0.4 mg p.o. daily. 11.Vitamin E 400 units daily. 12.Coumadin 4 mg p.o. daily.     Melvyn Novas, MD     RMD/MEDQ  D:  06/29/2011  T:  06/29/2011  Job:  [id number]

## 2011-06-29 NOTE — Anesthesia Postprocedure Evaluation (Signed)
Anesthesia Post Note  Patient: [name]  Procedure(s) Performed: Procedure(s) (LRB): CYSTOSCOPY/RETROGRADE/URETEROSCOPY (Right) STONE EXTRACTION WITH BASKET (Right)  Anesthesia type: Spinal  Patient location: 330  Post pain: Pain level controlled  Post assessment: Post-op Vital signs reviewed, Patient's Cardiovascular Status Stable, Respiratory Function Stable, Patent Airway, No signs of Nausea or vomiting and Pain level controlled  Last Vitals:  Filed Vitals:   06/29/11 0422  BP: 101/61  Pulse: 89  Temp: 36.7 C  Resp: 16    Post vital signs: Reviewed and stable  Level of consciousness: awake and alert   Complications: No apparent anesthesia complications

## 2011-07-01 LAB — GLUCOSE, CAPILLARY: Glucose-Capillary: 186 mg/dL — ABNORMAL HIGH (ref 70–99)

## 2011-07-01 NOTE — Progress Notes (Signed)
Discharge summary sent to payer through [facility]  

## 2011-07-02 ENCOUNTER — Encounter (HOSPITAL_COMMUNITY): Payer: Self-pay | Admitting: Urology

## 2011-07-26 ENCOUNTER — Other Ambulatory Visit: Payer: Self-pay | Admitting: *Deleted

## 2011-07-26 DIAGNOSIS — I10 Essential (primary) hypertension: Secondary | ICD-10-CM

## 2011-08-02 ENCOUNTER — Encounter: Payer: Self-pay | Admitting: *Deleted

## 2011-08-06 ENCOUNTER — Encounter: Payer: Self-pay | Admitting: *Deleted

## 2011-08-27 ENCOUNTER — Other Ambulatory Visit (HOSPITAL_COMMUNITY): Payer: Self-pay | Admitting: Family Medicine

## 2011-08-27 DIAGNOSIS — N6320 Unspecified lump in the left breast, unspecified quadrant: Secondary | ICD-10-CM

## 2011-08-28 ENCOUNTER — Ambulatory Visit (HOSPITAL_COMMUNITY)
Admission: RE | Admit: 2011-08-28 | Discharge: 2011-08-28 | Disposition: A | Discharge status: Home / Self Care | Payer: Medicare Other | Source: Ambulatory Visit | Attending: Family Medicine | Admitting: Family Medicine

## 2011-08-28 DIAGNOSIS — N62 Hypertrophy of breast: Secondary | ICD-10-CM | POA: Insufficient documentation

## 2011-08-28 DIAGNOSIS — N63 Unspecified lump in unspecified breast: Secondary | ICD-10-CM | POA: Insufficient documentation

## 2011-08-28 DIAGNOSIS — N6320 Unspecified lump in the left breast, unspecified quadrant: Secondary | ICD-10-CM

## 2011-10-28 ENCOUNTER — Other Ambulatory Visit: Payer: Self-pay | Admitting: *Deleted

## 2011-10-28 ENCOUNTER — Other Ambulatory Visit: Payer: Self-pay | Admitting: Cardiology

## 2011-10-28 DIAGNOSIS — I10 Essential (primary) hypertension: Secondary | ICD-10-CM

## 2011-10-28 LAB — BASIC METABOLIC PANEL
CO2: 29 mEq/L (ref 19–32)
Calcium: 9.3 mg/dL (ref 8.4–10.5)
Chloride: 100 mEq/L (ref 96–112)
Sodium: 138 mEq/L (ref 135–145)

## 2011-10-29 ENCOUNTER — Telehealth: Payer: Self-pay | Admitting: Cardiology

## 2011-10-29 NOTE — Telephone Encounter (Signed)
Patient's wife was given lab results this morning but did not understand them.  Patient would like to speak with nurse about results. / tg

## 2011-10-29 NOTE — Telephone Encounter (Signed)
Advised pt that lab work was ok

## 2011-11-01 ENCOUNTER — Encounter: Payer: Self-pay | Admitting: Adult Health

## 2011-11-01 ENCOUNTER — Ambulatory Visit (INDEPENDENT_AMBULATORY_CARE_PROVIDER_SITE_OTHER): Payer: Medicare Other | Admitting: Adult Health

## 2011-11-01 VITALS — BP 126/68 | HR 79 | Ht 64.5 in | Wt 199.8 lb

## 2011-11-01 DIAGNOSIS — I4891 Unspecified atrial fibrillation: Secondary | ICD-10-CM

## 2011-11-01 DIAGNOSIS — I2510 Atherosclerotic heart disease of native coronary artery without angina pectoris: Secondary | ICD-10-CM

## 2011-11-01 DIAGNOSIS — I7090 Unspecified atherosclerosis: Secondary | ICD-10-CM

## 2011-11-01 NOTE — Assessment & Plan Note (Addendum)
He has no symptoms to suggest cardiac  Ischemia. Repeat echocardiogram completed in February of 2013, read by Dr. Dietrich Pates, revealed upper normal left ventricular chamber size, with mild left ventricular hypertrophy. The LVEF was reduced in the range of 25-30%. There is akinesis and scarring of the anterior septal and apical wall, akinesis of the inferior apical wall as well. Left atrium was moderately dilated. He did state that April fibrillation was present limiting the assessment of diastolic function.  No medication changes are recommended at this time. Low EF probably related to atrial fibrillation. No evidence of CHF. Weight is stable. Review of labs completed on 7/22/ 2013 per Dr. Roe Coombs [place]'s office demonstrating sodium of 138, potassium 4.5, creatinine 0.95, we will continue to monitor this patient closely. He will follow with Dr. Dietrich Pates on next appointment.

## 2011-11-01 NOTE — Progress Notes (Signed)
HPI:  [name] is a 77 year old patient of Dr.  Bing, that we follow for ongoing assessment and treatment of myocardial infarction, ischemic cardiomyopathy, with recent onset of atrial fibrillation. The patient is followed by Dr. Janna Arch for Coumadin dosing adjustments. The patient has been doing well without complaints of rapid heart rhythm, bleeding, or dyspnea on exertion. He is interested in joining a research study for diabetic patients on metformin. This is being done through [facility], in [place]. He denies any complaints of chest discomfort or fatigue. He is in good spirits and is quite talkative.  Allergies  Allergen Reactions  . Procaine Hcl     Current Outpatient Prescriptions  Medication Sig Dispense Refill  . carvedilol (COREG) 12.5 MG tablet Take 1 tablet (12.5 mg total) by mouth 2 (two) times daily with a meal.  60 tablet  5  . digoxin (LANOXIN) 0.25 MG tablet Take 1 tablet (0.25 mg total) by mouth daily.  30 tablet  3  . diltiazem (CARDIZEM CD) 180 MG 24 hr capsule Take 1 capsule (180 mg total) by mouth daily.  30 capsule  5  . FORA V12 BLOOD GLUCOSE TEST test strip 1 each by Other route as needed.       [name] Devices (LANCING DEVICE) MISC 1 each by Other route as needed.       . magnesium oxide (MAG-OX) 400 MG tablet Take 400 mg by mouth 2 (two) times daily.        . metFORMIN (GLUCOPHAGE) 500 MG tablet Take 500 mg by mouth 2 (two) times daily with a meal.        . Omega-3 Fatty Acids (FISH OIL BURP-LESS) 1000 MG CAPS Take 1,000 mg by mouth daily.        . pravastatin (PRAVACHOL) 40 MG tablet Take 40 mg by mouth at bedtime.      . Tamsulosin HCl (FLOMAX) 0.4 MG CAPS Take 1 capsule (0.4 mg total) by mouth daily.  30 capsule  3  . vitamin E 400 UNIT capsule Take 400 Units by mouth daily.        Marland Kitchen warfarin (COUMADIN) 4 MG tablet Take 4 mg by mouth daily.      [name] 3.75 G PACK Take 3.75 g by mouth daily.         Past Medical History  Diagnosis  Date  . Arteriosclerotic cardiovascular disease (ASCVD)   . Diabetes mellitus   . Atrial fibrillation     Onset in 2012  . COPD (chronic obstructive pulmonary disease)   . Gout   . DJD (degenerative joint disease)   . Hyperlipidemia   . Obstructive sleep apnea   . Chronic anticoagulation 2012    2012  . Myocardial infarction   . CHF (congestive heart failure)     Past Surgical History  Procedure Date  . Cholecystectomy   . Knee surgery   . Vasectomy   . Cystoscopy/retrograde/ureteroscopy 06/28/2011    Procedure: CYSTOSCOPY/RETROGRADE/URETEROSCOPY;  Surgeon: Ky Barban, MD;  Location: [facility];  Service: Urology;  Laterality: Right;  . Stone extraction with basket 06/28/2011    Procedure: STONE EXTRACTION WITH BASKET;  Surgeon: Ky Barban, MD;  Location: [facility];  Service: Urology;  Laterality: Right;  specimen given to family per MD    ZOX:WRUEAV of systems complete and found to be negative unless listed above PHYSICAL EXAM BP 126/68  Pulse 79  Ht 5' 4.5" (1.638 m)  Wt 199 lb 12.8 oz (90.629 kg)  BMI 33.77 kg/m2  SpO2 94%  General: Well developed, well nourished, in no acute distress, obsese Head: Eyes PERRLA, No xanthomas.   Normal cephalic and atraumatic  Lungs: Clear bilaterally to auscultation and percussion. Heart: HRRR S1 S2, irregular at times.  Pulses are 2+ & equal.            No carotid bruit. No JVD.  No abdominal bruits. No femoral bruits. Abdomen: Bowel sounds are positive, abdomen soft and non-tender without masses or  Hernia's noted. Central obesity. Msk:  Back normal, normal gait. Normal strength and tone for age. Extremities: No clubbing, cyanosis or edema.  DP +1 Neuro: Alert and oriented X 3. Psych:  Good affect, responds appropriately :  ASSESSMENT AND PLAN

## 2011-11-01 NOTE — Patient Instructions (Addendum)
Your physician recommends that you schedule a follow-up appointment in: 6 months  

## 2011-11-01 NOTE — Assessment & Plan Note (Signed)
Heart rate is well-controlled. He has no complaints of rapid heart rate or palpitations. He is followed by Dr. Delbert Harness in his office for Coumadin dosing. No evidence of bleeding. No evidence of fatigue. Would continue current medications as they are working well for him with no symptoms. He will be seen in 6 months unless he requires cardiac evaluation sooner.

## 2012-02-02 ENCOUNTER — Emergency Department (HOSPITAL_COMMUNITY)
Admission: EM | Admit: 2012-02-02 | Discharge: 2012-02-03 | Disposition: A | Discharge status: Home / Self Care | Payer: Medicare Other | Attending: Emergency Medicine | Admitting: Emergency Medicine

## 2012-02-02 ENCOUNTER — Other Ambulatory Visit: Payer: Self-pay

## 2012-02-02 ENCOUNTER — Emergency Department (HOSPITAL_COMMUNITY): Payer: Medicare Other

## 2012-02-02 ENCOUNTER — Encounter (HOSPITAL_COMMUNITY): Payer: Self-pay | Admitting: *Deleted

## 2012-02-02 DIAGNOSIS — R51 Headache: Secondary | ICD-10-CM | POA: Insufficient documentation

## 2012-02-02 DIAGNOSIS — Z79899 Other long term (current) drug therapy: Secondary | ICD-10-CM | POA: Insufficient documentation

## 2012-02-02 DIAGNOSIS — I2510 Atherosclerotic heart disease of native coronary artery without angina pectoris: Secondary | ICD-10-CM | POA: Insufficient documentation

## 2012-02-02 DIAGNOSIS — I4891 Unspecified atrial fibrillation: Secondary | ICD-10-CM | POA: Insufficient documentation

## 2012-02-02 DIAGNOSIS — I252 Old myocardial infarction: Secondary | ICD-10-CM | POA: Insufficient documentation

## 2012-02-02 DIAGNOSIS — M109 Gout, unspecified: Secondary | ICD-10-CM | POA: Insufficient documentation

## 2012-02-02 DIAGNOSIS — E785 Hyperlipidemia, unspecified: Secondary | ICD-10-CM | POA: Insufficient documentation

## 2012-02-02 DIAGNOSIS — J4489 Other specified chronic obstructive pulmonary disease: Secondary | ICD-10-CM | POA: Insufficient documentation

## 2012-02-02 DIAGNOSIS — J069 Acute upper respiratory infection, unspecified: Secondary | ICD-10-CM | POA: Insufficient documentation

## 2012-02-02 DIAGNOSIS — Z7901 Long term (current) use of anticoagulants: Secondary | ICD-10-CM | POA: Insufficient documentation

## 2012-02-02 DIAGNOSIS — G4733 Obstructive sleep apnea (adult) (pediatric): Secondary | ICD-10-CM | POA: Insufficient documentation

## 2012-02-02 DIAGNOSIS — D689 Coagulation defect, unspecified: Secondary | ICD-10-CM | POA: Insufficient documentation

## 2012-02-02 DIAGNOSIS — M1990 Unspecified osteoarthritis, unspecified site: Secondary | ICD-10-CM | POA: Insufficient documentation

## 2012-02-02 DIAGNOSIS — I509 Heart failure, unspecified: Secondary | ICD-10-CM | POA: Insufficient documentation

## 2012-02-02 DIAGNOSIS — N390 Urinary tract infection, site not specified: Secondary | ICD-10-CM | POA: Insufficient documentation

## 2012-02-02 DIAGNOSIS — J449 Chronic obstructive pulmonary disease, unspecified: Secondary | ICD-10-CM | POA: Insufficient documentation

## 2012-02-02 DIAGNOSIS — Z87891 Personal history of nicotine dependence: Secondary | ICD-10-CM | POA: Insufficient documentation

## 2012-02-02 DIAGNOSIS — E119 Type 2 diabetes mellitus without complications: Secondary | ICD-10-CM | POA: Insufficient documentation

## 2012-02-02 LAB — URINALYSIS, ROUTINE W REFLEX MICROSCOPIC
Nitrite: NEGATIVE
Specific Gravity, Urine: 1.025 (ref 1.005–1.030)
Urobilinogen, UA: 0.2 mg/dL (ref 0.0–1.0)
pH: 6 (ref 5.0–8.0)

## 2012-02-02 LAB — CBC WITH DIFFERENTIAL/PLATELET
Basophils Absolute: 0 10*3/uL (ref 0.0–0.1)
Basophils Relative: 0 % (ref 0–1)
Eosinophils Absolute: 0.1 10*3/uL (ref 0.0–0.7)
Hemoglobin: 15.7 g/dL (ref 13.0–17.0)
MCH: 33.2 pg (ref 26.0–34.0)
MCHC: 34.4 g/dL (ref 30.0–36.0)
Monocytes Relative: 9 % (ref 3–12)
Neutro Abs: 6.8 10*3/uL (ref 1.7–7.7)
Neutrophils Relative %: 72 % (ref 43–77)
Platelets: 148 10*3/uL — ABNORMAL LOW (ref 150–400)
RDW: 13.5 % (ref 11.5–15.5)

## 2012-02-02 LAB — COMPREHENSIVE METABOLIC PANEL
ALT: 24 U/L (ref 0–53)
AST: 34 U/L (ref 0–37)
Albumin: 4 g/dL (ref 3.5–5.2)
Alkaline Phosphatase: 61 U/L (ref 39–117)
BUN: 10 mg/dL (ref 6–23)
Chloride: 97 mEq/L (ref 96–112)
Potassium: 4.1 mEq/L (ref 3.5–5.1)
Sodium: 136 mEq/L (ref 135–145)
Total Bilirubin: 0.8 mg/dL (ref 0.3–1.2)
Total Protein: 7 g/dL (ref 6.0–8.3)

## 2012-02-02 LAB — URINE MICROSCOPIC-ADD ON

## 2012-02-02 MED ORDER — CEPHALEXIN 500 MG PO CAPS: 500.0000 mg | ORAL_CAPSULE | Freq: Three times a day (TID) | ORAL | Status: DC

## 2012-02-02 MED ORDER — SODIUM CHLORIDE 0.9 % IV BOLUS (SEPSIS)
1000.0000 mL | Freq: Once | INTRAVENOUS | Status: AC
  Administered 2012-02-02: 1000 mL via INTRAVENOUS

## 2012-02-02 NOTE — ED Notes (Signed)
Per nurse - okay to give patient some ice chips.

## 2012-02-02 NOTE — ED Notes (Signed)
Family members requested an unopened box of Kleenex and cups of ice.

## 2012-02-02 NOTE — ED Provider Notes (Signed)
History   This chart was scribed for Benny Lennert, MD by Melba Coon. The patient was seen in room [facility] and the patient's care was started at 8:57PM.    CSN: [id number]  Arrival date & time 02/02/12  1858   First MD Initiated Contact with Patient 02/02/12 2056      Chief Complaint  Patient presents with  . Headache  . Shortness of Breath  . Fatigue  . Nasal Congestion    (Consider location/radiation/quality/duration/timing/severity/associated sxs/prior treatment) Patient is a 77 y.o. male presenting with headaches and shortness of breath. The history is provided by the patient. No language interpreter was used.  Headache  This is a new problem. The current episode started 2 days ago. The problem occurs constantly. The problem has been gradually worsening. The headache is associated with coughing. The pain is moderate. Associated symptoms include shortness of breath. Pertinent negatives include no fever.  Shortness of Breath  The current episode started 2 days ago. The problem occurs occasionally. The problem has been unchanged. The problem is mild. Nothing relieves the symptoms. Associated symptoms include rhinorrhea, cough and shortness of breath. Pertinent negatives include no chest pain and no fever. There was no intake of a foreign body. His past medical history does not include asthma.  He has not had a flu shot this year. No other pertinent medical symptoms.  Past Medical History  Diagnosis Date  . Arteriosclerotic cardiovascular disease (ASCVD)   . Diabetes mellitus   . Atrial fibrillation     Onset in 2012  . COPD (chronic obstructive pulmonary disease)   . Gout   . DJD (degenerative joint disease)   . Hyperlipidemia   . Obstructive sleep apnea   . Chronic anticoagulation 2012    2012  . Myocardial infarction   . CHF (congestive heart failure)     Past Surgical History  Procedure Date  . Cholecystectomy   . Knee surgery   . Vasectomy   .  Cystoscopy/retrograde/ureteroscopy 06/28/2011    Procedure: CYSTOSCOPY/RETROGRADE/URETEROSCOPY;  Surgeon: Ky Barban, MD;  Location: [facility];  Service: Urology;  Laterality: Right;  . Stone extraction with basket 06/28/2011    Procedure: STONE EXTRACTION WITH BASKET;  Surgeon: Ky Barban, MD;  Location: [facility];  Service: Urology;  Laterality: Right;  specimen given to family per MD    Family History  Problem Relation Age of Onset  . Heart failure Mother   . Heart failure Father     History  Substance Use Topics  . Smoking status: Former Smoker -- 1.0 packs/day for 40 years    Types: Cigarettes    Quit date: 04/08/1992  . Smokeless tobacco: Never Used  . Alcohol Use: No      Review of Systems  Constitutional: Negative for fever and fatigue.  HENT: Positive for congestion, rhinorrhea and sneezing. Negative for sinus pressure and ear discharge.   Eyes: Negative for discharge.  Respiratory: Positive for cough and shortness of breath.   Cardiovascular: Negative for chest pain.  Gastrointestinal: Negative for abdominal pain and diarrhea.  Genitourinary: Positive for hematuria (per daughter of pt). Negative for frequency.  Musculoskeletal: Negative for back pain.  Skin: Negative for rash.  Neurological: Positive for headaches. Negative for seizures.  Hematological: Negative.   Psychiatric/Behavioral: Negative for hallucinations.  All other systems reviewed and are negative.    Allergies  Procaine hcl  Home Medications   Current Outpatient Rx  Name Route Sig Dispense Refill  . CARVEDILOL 12.5 MG  PO TABS Oral Take 1 tablet (12.5 mg total) by mouth 2 (two) times daily with a meal. 60 tablet 5  . DIGOXIN 0.25 MG PO TABS Oral Take 1 tablet (0.25 mg total) by mouth daily. 30 tablet 3  . DILTIAZEM HCL ER COATED BEADS 180 MG PO CP24 Oral Take 1 capsule (180 mg total) by mouth daily. 30 capsule 5  . FORA V12 BLOOD GLUCOSE TEST VI STRP Other 1 each by Other route as  needed.     Marland Kitchen LANCING DEVICE MISC Other 1 each by Other route as needed.     Marland Kitchen MAGNESIUM OXIDE 400 MG PO TABS Oral Take 400 mg by mouth 2 (two) times daily.      Marland Kitchen METFORMIN HCL 500 MG PO TABS Oral Take 500 mg by mouth 2 (two) times daily with a meal.      . FISH OIL BURP-LESS 1000 MG PO CAPS Oral Take 1,000 mg by mouth daily.      Marland Kitchen PRAVASTATIN SODIUM 40 MG PO TABS Oral Take 40 mg by mouth at bedtime.    . TAMSULOSIN HCL 0.4 MG PO CAPS Oral Take 1 capsule (0.4 mg total) by mouth daily. 30 capsule 3  . VITAMIN E 400 UNITS PO CAPS Oral Take 400 Units by mouth daily.      . WARFARIN SODIUM 4 MG PO TABS Oral Take 4 mg by mouth daily.    Lilian Kapur 3.75 G PO PACK Oral Take 3.75 g by mouth daily.       BP 134/73  Pulse 76  Temp 98.7 F (37.1 C) (Oral)  Resp 20  Ht 5\' 5"  (1.651 m)  Wt 200 lb (90.719 kg)  BMI 33.28 kg/m2  SpO2 96%  Physical Exam  Nursing note and vitals reviewed. Constitutional: He is oriented to person, place, and time. He appears well-developed.  HENT:  Head: Normocephalic and atraumatic.  Eyes: Conjunctivae normal and EOM are normal. No scleral icterus.  Neck: Neck supple. No thyromegaly present.  Cardiovascular: Normal rate and regular rhythm.  Exam reveals no gallop and no friction rub.   No murmur heard. Pulmonary/Chest: No stridor. He has no wheezes. He has no rales. He exhibits no tenderness.  Abdominal: He exhibits no distension. There is no tenderness. There is no rebound.  Musculoskeletal: Normal range of motion. He exhibits no edema.  Lymphadenopathy:    He has no cervical adenopathy.  Neurological: He is oriented to person, place, and time. Coordination normal.  Skin: No rash noted. No erythema.  Psychiatric: He has a normal mood and affect. His behavior is normal.    ED Course  Procedures (including critical care time)  DIAGNOSTIC STUDIES: Oxygen Saturation is 99% on room air, normal by my interpretation.    COORDINATION OF CARE:  9:00PM - IV  fluids, CXR, blood w/u, and UA will be ordered for Mr Bodin.   Labs Reviewed  URINALYSIS, ROUTINE W REFLEX MICROSCOPIC - Abnormal; Notable for the following:    Hgb urine dipstick LARGE (*)     Bilirubin Urine SMALL (*)     All other components within normal limits  URINE MICROSCOPIC-ADD ON   No results found.   No diagnosis found.    MDM    The chart was scribed for me under my direct supervision.  I personally performed the history, physical, and medical decision making and all procedures in the evaluation of this patient.Benny Lennert, MD 02/02/12 [phone]

## 2012-02-02 NOTE — ED Notes (Signed)
Pt c/o headache, runny nose, ear ache, throat hurts, decreased appetite, sob, and feels like his lower legs are giving out on him.

## 2012-02-03 NOTE — ED Notes (Signed)
Pt discharged. Pt stable at time of discharge. Medications reviewed pt has no questions regarding discharge at this time. Pt voiced understanding of discharge instructions.  

## 2012-02-04 LAB — URINE CULTURE

## 2012-02-17 ENCOUNTER — Encounter: Payer: Self-pay | Admitting: Cardiology

## 2012-02-19 LAB — URINALYSIS, ROUTINE W REFLEX MICROSCOPIC

## 2012-02-27 ENCOUNTER — Emergency Department (HOSPITAL_COMMUNITY): Payer: Medicare Other

## 2012-02-27 ENCOUNTER — Observation Stay (HOSPITAL_COMMUNITY)
Admission: EM | Admit: 2012-02-27 | Discharge: 2012-02-28 | DRG: 086 | Disposition: A | Discharge status: Home / Self Care | Payer: Medicare Other | Attending: General Surgery | Admitting: General Surgery

## 2012-02-27 ENCOUNTER — Encounter (HOSPITAL_COMMUNITY): Payer: Self-pay | Admitting: *Deleted

## 2012-02-27 DIAGNOSIS — S066X0A Traumatic subarachnoid hemorrhage without loss of consciousness, initial encounter: Principal | ICD-10-CM | POA: Diagnosis present

## 2012-02-27 DIAGNOSIS — M109 Gout, unspecified: Secondary | ICD-10-CM | POA: Diagnosis present

## 2012-02-27 DIAGNOSIS — Z87442 Personal history of urinary calculi: Secondary | ICD-10-CM

## 2012-02-27 DIAGNOSIS — Z87891 Personal history of nicotine dependence: Secondary | ICD-10-CM

## 2012-02-27 DIAGNOSIS — Y9289 Other specified places as the place of occurrence of the external cause: Secondary | ICD-10-CM

## 2012-02-27 DIAGNOSIS — W19XXXA Unspecified fall, initial encounter: Secondary | ICD-10-CM

## 2012-02-27 DIAGNOSIS — I4891 Unspecified atrial fibrillation: Secondary | ICD-10-CM | POA: Diagnosis present

## 2012-02-27 DIAGNOSIS — W1809XA Striking against other object with subsequent fall, initial encounter: Secondary | ICD-10-CM | POA: Diagnosis present

## 2012-02-27 DIAGNOSIS — S2249XA Multiple fractures of ribs, unspecified side, initial encounter for closed fracture: Secondary | ICD-10-CM | POA: Diagnosis present

## 2012-02-27 DIAGNOSIS — E875 Hyperkalemia: Secondary | ICD-10-CM | POA: Diagnosis present

## 2012-02-27 DIAGNOSIS — E785 Hyperlipidemia, unspecified: Secondary | ICD-10-CM | POA: Diagnosis present

## 2012-02-27 DIAGNOSIS — I252 Old myocardial infarction: Secondary | ICD-10-CM

## 2012-02-27 DIAGNOSIS — S1093XA Contusion of unspecified part of neck, initial encounter: Secondary | ICD-10-CM | POA: Diagnosis present

## 2012-02-27 DIAGNOSIS — J4489 Other specified chronic obstructive pulmonary disease: Secondary | ICD-10-CM | POA: Diagnosis present

## 2012-02-27 DIAGNOSIS — G4733 Obstructive sleep apnea (adult) (pediatric): Secondary | ICD-10-CM | POA: Diagnosis present

## 2012-02-27 DIAGNOSIS — Z79899 Other long term (current) drug therapy: Secondary | ICD-10-CM

## 2012-02-27 DIAGNOSIS — S2239XA Fracture of one rib, unspecified side, initial encounter for closed fracture: Secondary | ICD-10-CM

## 2012-02-27 DIAGNOSIS — S2242XA Multiple fractures of ribs, left side, initial encounter for closed fracture: Secondary | ICD-10-CM | POA: Diagnosis present

## 2012-02-27 DIAGNOSIS — S066X9A Traumatic subarachnoid hemorrhage with loss of consciousness of unspecified duration, initial encounter: Secondary | ICD-10-CM | POA: Diagnosis present

## 2012-02-27 DIAGNOSIS — IMO0002 Reserved for concepts with insufficient information to code with codable children: Secondary | ICD-10-CM | POA: Diagnosis present

## 2012-02-27 DIAGNOSIS — S0003XA Contusion of scalp, initial encounter: Secondary | ICD-10-CM | POA: Diagnosis present

## 2012-02-27 DIAGNOSIS — E119 Type 2 diabetes mellitus without complications: Secondary | ICD-10-CM | POA: Diagnosis present

## 2012-02-27 DIAGNOSIS — J449 Chronic obstructive pulmonary disease, unspecified: Secondary | ICD-10-CM | POA: Diagnosis present

## 2012-02-27 DIAGNOSIS — I2510 Atherosclerotic heart disease of native coronary artery without angina pectoris: Secondary | ICD-10-CM | POA: Diagnosis present

## 2012-02-27 LAB — BASIC METABOLIC PANEL
BUN: 14 mg/dL (ref 6–23)
CO2: 27 mEq/L (ref 19–32)
Calcium: 9.4 mg/dL (ref 8.4–10.5)
Chloride: 99 mEq/L (ref 96–112)
Creatinine, Ser: 0.92 mg/dL (ref 0.50–1.35)

## 2012-02-27 LAB — CBC WITH DIFFERENTIAL/PLATELET
Basophils Relative: 0 % (ref 0–1)
Eosinophils Relative: 1 % (ref 0–5)
HCT: 41.4 % (ref 39.0–52.0)
Hemoglobin: 13.7 g/dL (ref 13.0–17.0)
Lymphocytes Relative: 17 % (ref 12–46)
MCHC: 33.1 g/dL (ref 30.0–36.0)
MCV: 97.4 fL (ref 78.0–100.0)
Monocytes Absolute: 0.8 10*3/uL (ref 0.1–1.0)
Monocytes Relative: 9 % (ref 3–12)
Neutro Abs: 6 10*3/uL (ref 1.7–7.7)

## 2012-02-27 MED ORDER — SODIUM CHLORIDE 0.9 % IV SOLN
Freq: Once | INTRAVENOUS | Status: AC
  Administered 2012-02-27: 22:00:00 via INTRAVENOUS

## 2012-02-27 MED ORDER — HYDROMORPHONE HCL PF 1 MG/ML IJ SOLN
0.5000 mg | Freq: Once | INTRAMUSCULAR | Status: AC
  Administered 2012-02-27: 0.5 mg via INTRAVENOUS
  Filled 2012-02-27: qty 1

## 2012-02-27 MED ORDER — ONDANSETRON HCL 4 MG/2ML IJ SOLN
4.0000 mg | Freq: Once | INTRAMUSCULAR | Status: AC
  Administered 2012-02-27: 4 mg via INTRAVENOUS
  Filled 2012-02-27: qty 2

## 2012-02-27 NOTE — ED Notes (Addendum)
Placed on oxygen at 2 lpm via [place].  O2 saturations noted to occasionally drop to 87%, improved with deep breathing and coughing.

## 2012-02-27 NOTE — ED Notes (Signed)
Remains neurologically intact.  PEARL.  Prefers to lay almost flat related to the pain in his left chest.  Family at bedside.

## 2012-02-27 NOTE — ED Notes (Signed)
Pt has several small abrasions on both knees and hands and down the arms.  Superficial laceration to the left shin.  Abrasions to the back of the head.  C/o left knee pain, and left rib pain.  Denies loss of consciousness at time of fall.

## 2012-02-27 NOTE — ED Notes (Signed)
States fell down an 8 foot embankment; pt states he had pulled over to use the bathroom and fell backward. C/o left rib pain, right hip pain, left lower leg pain.

## 2012-02-27 NOTE — Consult Note (Addendum)
History    CSN: [id number]  Arrival date & time 02/27/12 1924  First MD Initiated Contact with Patient 02/27/12 1943  Chief Complaint   Patient presents with   .  Fall    (Consider location/radiation/quality/duration/timing/severity/associated sxs/prior  treatment)  Patient is a 77 y.o. male presenting with fall. The history is provided by the patient (the pt states he fell down a hill and hit his head. no loc). No language interpreter was used.  Fall  The accident occurred 3 to 5 hours ago. The fall occurred while walking. He fell from an unknown height. He landed on grass. There was no blood loss. The point of impact was the head. The pain is present in the head (chest also hurts). The pain is at a severity of 3/10. The pain is moderate. He was not ambulatory at the scene. There was no entrapment after the fall. There was no drug use involved in the accident. Associated symptoms include headaches. Pertinent negatives include no abdominal pain and no hematuria. I was called by Dr. Agapito Games because of [facility] identified on CT.  Given that patient is on coumadin, we will transfer to [facility] and reverse anticoagulation, observe and repeat head CT in am to make sure SAH is stable.  Past Medical History   Diagnosis  Date   .  Arteriosclerotic cardiovascular disease (ASCVD)    .  Diabetes mellitus    .  Atrial fibrillation      Onset in 2012   .  COPD (chronic obstructive pulmonary disease)    .  Gout    .  DJD (degenerative joint disease)    .  Hyperlipidemia    .  Obstructive sleep apnea    .  Chronic anticoagulation  2012     2012   .  Myocardial infarction    .  CHF (congestive heart failure)     Past Surgical History   Procedure  Date   .  Cholecystectomy    .  Knee surgery    .  Vasectomy    .  Cystoscopy/retrograde/ureteroscopy  06/28/2011     Procedure: CYSTOSCOPY/RETROGRADE/URETEROSCOPY; Surgeon: Ky Barban, MD; Location: [facility]; Service: Urology; Laterality: Right;   .  Stone  extraction with basket  06/28/2011     Procedure: STONE EXTRACTION WITH BASKET; Surgeon: Ky Barban, MD; Location: [facility]; Service: Urology; Laterality: Right; specimen given to family per MD    Family History   Problem  Relation  Age of Onset   .  Heart failure  Mother    .  Heart failure  Father     History   Substance Use Topics   .  Smoking status:  Former Smoker -- 1.0 packs/day for 40 years     Types:  Cigarettes     Quit date:  04/08/1992   .  Smokeless tobacco:  Never Used   .  Alcohol Use:  No     Review of Systems  Constitutional: Negative for fatigue.  HENT: Negative for congestion, sinus pressure and ear discharge.  Eyes: Negative for discharge.  Respiratory: Negative for cough.  Cardiovascular: Positive for chest pain.  Gastrointestinal: Negative for abdominal pain and diarrhea.  Genitourinary: Negative for frequency and hematuria.  Musculoskeletal: Negative for back pain.  Skin: Negative for rash.  Neurological: Positive for headaches. Negative for seizures.  Hematological: Negative.  Psychiatric/Behavioral: Negative for hallucinations.   Allergies   Procaine hcl  Home Medications    Current Outpatient Rx  Name   Route   Sig   Dispense   Refill   .  CARVEDILOL 12.5 MG PO TABS   Oral   Take 1 tablet (12.5 mg total) by mouth 2 (two) times daily with a meal.   60 tablet   5   .  CEPHALEXIN 500 MG PO CAPS   Oral   Take 1 capsule (500 mg total) by mouth 3 (three) times daily.   21 capsule   0   .  DIGOXIN 0.25 MG PO TABS   Oral   Take 1 tablet (0.25 mg total) by mouth daily.   30 tablet   3   .  DILTIAZEM HCL ER COATED BEADS 180 MG PO CP24   Oral   Take 1 capsule (180 mg total) by mouth daily.   30 capsule   5   .  FORA V12 BLOOD GLUCOSE TEST VI STRP   Other   1 each by Other route as needed.       Marland Kitchen  LANCING DEVICE MISC   Other   1 each by Other route as needed.       Marland Kitchen  MAGNESIUM OXIDE 400 MG PO TABS   Oral   Take 400 mg by mouth 2 (two) times daily.         Marland Kitchen  METFORMIN HCL 500 MG PO TABS   Oral   Take 500 mg by mouth 2 (two) times daily with a meal.       .  FISH OIL BURP-LESS 1000 MG PO CAPS   Oral   Take 1,000 mg by mouth daily.       Marland Kitchen  PRAVASTATIN SODIUM 40 MG PO TABS   Oral   Take 40 mg by mouth at bedtime.       Marland Kitchen  VITAMIN E 400 UNITS PO CAPS   Oral   Take 400 Units by mouth daily.       .  WARFARIN SODIUM 4 MG PO TABS   Oral   Take 2-4 mg by mouth daily. Patient takes 1 tablet daily then 1/2 tablet the next day then 1/2 tablet the next day then bact to 1 tablet       .  WELCHOL 3.75 G PO PACK   Oral   Take 3.75 g by mouth daily.        BP 147/90  Pulse 87  Temp 97.7 F (36.5 C) (Oral)  Resp 20  SpO2 95%  Physical Exam  Constitutional: He is oriented to person, place, and time. He appears well-developed.  HENT:  Head: Normocephalic.  Contusion to occipital head  Eyes: Conjunctivae normal and EOM are normal. No scleral icterus.  Neck: Neck supple. No thyromegaly present.  Cardiovascular: Normal rate and regular rhythm. Exam reveals no gallop and no friction rub.  No murmur heard.  Pulmonary/Chest: No stridor. He has no wheezes. He has no rales. He exhibits tenderness.  Left lateral chest tender  Abdominal: He exhibits no distension. There is no tenderness. There is no rebound.  Musculoskeletal: Normal range of motion. He exhibits no edema.  Lymphadenopathy:  He has no cervical adenopathy.  Neurological: He is oriented to person, place, and time. Coordination normal.  Skin: No rash noted. No erythema.  Psychiatric: He has a normal mood and affect. His behavior is normal.   ED Course   Procedures (including critical care time)   Labs Reviewed   CBC WITH DIFFERENTIAL   BASIC METABOLIC PANEL  PROTIME-INR    Dg Ribs Unilateral W/chest Left  02/27/2012 *RADIOLOGY REPORT* Clinical Data: Post fall, now with pelvic left lateral and posterior rib pain LEFT RIBS AND CHEST - 3+ VIEW Comparison: 02/02/2012; 01/15/2011; 05/26/2008  Findings: Grossly unchanged enlarged cardiac silhouette and mediastinal contours with atherosclerotic calcifications within the thoracic aorta. Pulmonary venous congestion without frank evidence of edema. Grossly unchanged perihilar and bibasilar opacities. No pleural effusion or pneumothorax. Nondisplaced fractures of the lateral aspect of the left third, fifth, sixth and seventh ribs. IMPRESSION: 1. Nondisplaced fractures of the lateral aspect of the left third, fifth, sixth and seventh ribs. 2. Stable findings of enlarged cardiac silhouette and pulmonary venous congestion without frank evidence of pulmonary edema. Original Report Authenticated By: Tacey Ruiz, MD  Dg Pelvis 1-2 Views  02/27/2012 *RADIOLOGY REPORT* Clinical Data: FALL TODAY. PT C/O PELVIC PAIN AND LT LATERAL AND POSTERIOR RIB PAIN. PELVIS - 1-2 VIEW Comparison: 06/24/2011 Findings: Vascular calcifications noted. Lower lumbar facet arthropathy noted. No fracture or acute bony findings. IMPRESSION: 1. No acute bony findings. Lower lumbar facet arthropathy. 2. Atherosclerosis. Original Report Authenticated By: Gaylyn Rong, M.D.  Ct Head Wo Contrast  02/27/2012 *RADIOLOGY REPORT* Clinical Data: Larey Seat backwards, posterior head and neck pain CT HEAD WITHOUT CONTRAST CT CERVICAL SPINE WITHOUT CONTRAST Technique: Multidetector CT imaging of the head and cervical spine was performed following the standard protocol without intravenous contrast. Multiplanar CT image reconstructions of the cervical spine were also generated. Comparison: 05/26/2008 CT HEAD Findings: Generalized atrophy. Normal ventricular morphology. No midline shift or mass effect. Otherwise normal appearance of brain parenchyma. No mass lesion or evidence of acute infarction. New small focus of extra-axial high attenuation is identified at the high right frontal region 14 x 8 mm in size, measuring 73 Hounsfield units average attenuation, most consistent with a small focus of acute  subarachnoid hemorrhage. No additional extra-axial blood identified. Mucosal thickening left maxillary sinus. Skull intact. IMPRESSION: Generalized atrophy. Small focus of acute subarachnoid hemorrhage at the high right frontal region. CT CERVICAL SPINE Findings: Visualized skull base intact. Scattered atherosclerotic calcifications of the carotid systems. Small bilateral pleural effusions noted at lung apices. Vertebral body and disc space heights maintained. Tiny anterior spurs at C6-C7 endplates. Prevertebral soft tissues normal thickness. No acute fracture, subluxation or bone destruction. Small soft tissue calcification in the posterior cervical region incidentally noted. IMPRESSION: No acute cervical spine abnormalities. Critical Value/emergent results were called by telephone at the time of interpretation on 02/27/2012 at 2048 hrs to Dr. Estell Harpin, who verbally acknowledged these results. Original Report Authenticated By: Ulyses Southward, M.D.   1.  Subarachnoid hematoma   2.  Rib fractures   3.  Fall    I spoke with dr. Agapito Games who agreed with transfer to [facility] to admit. He also wanted to give 2 units of ffp in inr 2. Dr. Janee Morn called and accepted the pt   MDM    Monitor in NICU with frequent neuro checks, reversal of anticoagulation and repeat head CT in am. Given current INR of 1.58, we do not need to reverse patient's coumadin, but can let the levels drift down, since he is currently subtherapeutic.

## 2012-02-27 NOTE — ED Provider Notes (Signed)
History     CSN: [id number]  Arrival date & time 02/27/12  1924   First MD Initiated Contact with Patient 02/27/12 1943      Chief Complaint  Patient presents with  . Fall    (Consider location/radiation/quality/duration/timing/severity/associated sxs/prior treatment) Patient is a 77 y.o. male presenting with fall. The history is provided by the patient (the pt states he fell down a hill and hit his head.  no loc). No language interpreter was used.  Fall The accident occurred 3 to 5 hours ago. The fall occurred while walking. He fell from an unknown height. He landed on grass. There was no blood loss. The point of impact was the head. The pain is present in the head (chest also hurts). The pain is at a severity of 3/10. The pain is moderate. He was not ambulatory at the scene. There was no entrapment after the fall. There was no drug use involved in the accident. Associated symptoms include headaches. Pertinent negatives include no abdominal pain and no hematuria.    Past Medical History  Diagnosis Date  . Arteriosclerotic cardiovascular disease (ASCVD)   . Diabetes mellitus   . Atrial fibrillation     Onset in 2012  . COPD (chronic obstructive pulmonary disease)   . Gout   . DJD (degenerative joint disease)   . Hyperlipidemia   . Obstructive sleep apnea   . Chronic anticoagulation 2012    2012  . Myocardial infarction   . CHF (congestive heart failure)     Past Surgical History  Procedure Date  . Cholecystectomy   . Knee surgery   . Vasectomy   . Cystoscopy/retrograde/ureteroscopy 06/28/2011    Procedure: CYSTOSCOPY/RETROGRADE/URETEROSCOPY;  Surgeon: Ky Barban, MD;  Location: [facility];  Service: Urology;  Laterality: Right;  . Stone extraction with basket 06/28/2011    Procedure: STONE EXTRACTION WITH BASKET;  Surgeon: Ky Barban, MD;  Location: [facility];  Service: Urology;  Laterality: Right;  specimen given to family per MD    Family History  Problem  Relation Age of Onset  . Heart failure Mother   . Heart failure Father     History  Substance Use Topics  . Smoking status: Former Smoker -- 1.0 packs/day for 40 years    Types: Cigarettes    Quit date: 04/08/1992  . Smokeless tobacco: Never Used  . Alcohol Use: No      Review of Systems  Constitutional: Negative for fatigue.  HENT: Negative for congestion, sinus pressure and ear discharge.   Eyes: Negative for discharge.  Respiratory: Negative for cough.   Cardiovascular: Positive for chest pain.  Gastrointestinal: Negative for abdominal pain and diarrhea.  Genitourinary: Negative for frequency and hematuria.  Musculoskeletal: Negative for back pain.  Skin: Negative for rash.  Neurological: Positive for headaches. Negative for seizures.  Hematological: Negative.   Psychiatric/Behavioral: Negative for hallucinations.    Allergies  Procaine hcl  Home Medications   Current Outpatient Rx  Name  Route  Sig  Dispense  Refill  . CARVEDILOL 12.5 MG PO TABS   Oral   Take 1 tablet (12.5 mg total) by mouth 2 (two) times daily with a meal.   60 tablet   5   . CEPHALEXIN 500 MG PO CAPS   Oral   Take 1 capsule (500 mg total) by mouth 3 (three) times daily.   21 capsule   0   . DIGOXIN 0.25 MG PO TABS   Oral   Take 1  tablet (0.25 mg total) by mouth daily.   30 tablet   3   . DILTIAZEM HCL ER COATED BEADS 180 MG PO CP24   Oral   Take 1 capsule (180 mg total) by mouth daily.   30 capsule   5   . FORA V12 BLOOD GLUCOSE TEST VI STRP   Other   1 each by Other route as needed.          Marland Kitchen LANCING DEVICE MISC   Other   1 each by Other route as needed.          Marland Kitchen MAGNESIUM OXIDE 400 MG PO TABS   Oral   Take 400 mg by mouth 2 (two) times daily.           Marland Kitchen METFORMIN HCL 500 MG PO TABS   Oral   Take 500 mg by mouth 2 (two) times daily with a meal.           . FISH OIL BURP-LESS 1000 MG PO CAPS   Oral   Take 1,000 mg by mouth daily.           Marland Kitchen  PRAVASTATIN SODIUM 40 MG PO TABS   Oral   Take 40 mg by mouth at bedtime.         Marland Kitchen VITAMIN E 400 UNITS PO CAPS   Oral   Take 400 Units by mouth daily.           . WARFARIN SODIUM 4 MG PO TABS   Oral   Take 2-4 mg by mouth daily. Patient takes 1 tablet daily then 1/2 tablet the next day then 1/2 tablet the next day then bact to 1 tablet         . WELCHOL 3.75 G PO PACK   Oral   Take 3.75 g by mouth daily.            BP 147/90  Pulse 87  Temp 97.7 F (36.5 C) (Oral)  Resp 20  SpO2 95%  Physical Exam  Constitutional: He is oriented to person, place, and time. He appears well-developed.  HENT:  Head: Normocephalic.       Contusion to occipital head  Eyes: Conjunctivae normal and EOM are normal. No scleral icterus.  Neck: Neck supple. No thyromegaly present.  Cardiovascular: Normal rate and regular rhythm.  Exam reveals no gallop and no friction rub.   No murmur heard. Pulmonary/Chest: No stridor. He has no wheezes. He has no rales. He exhibits tenderness.       Left lateral chest tender  Abdominal: He exhibits no distension. There is no tenderness. There is no rebound.  Musculoskeletal: Normal range of motion. He exhibits no edema.  Lymphadenopathy:    He has no cervical adenopathy.  Neurological: He is oriented to person, place, and time. Coordination normal.  Skin: No rash noted. No erythema.  Psychiatric: He has a normal mood and affect. His behavior is normal.    ED Course  Procedures (including critical care time)   Labs Reviewed  CBC WITH DIFFERENTIAL  BASIC METABOLIC PANEL  PROTIME-INR   Dg Ribs Unilateral W/chest Left  02/27/2012  *RADIOLOGY REPORT*  Clinical Data: Post fall, now with pelvic left lateral and posterior rib pain  LEFT RIBS AND CHEST - 3+ VIEW  Comparison: 02/02/2012; 01/15/2011; 05/26/2008  Findings:  Grossly unchanged enlarged cardiac silhouette and mediastinal contours with atherosclerotic calcifications within the thoracic aorta.   Pulmonary venous congestion without frank evidence of edema.  Grossly unchanged perihilar and bibasilar opacities.  No pleural effusion or pneumothorax.  Nondisplaced fractures of the lateral aspect of the left third, fifth, sixth and seventh ribs.  IMPRESSION: 1.  Nondisplaced fractures of the lateral aspect of the left third, fifth, sixth and seventh ribs. 2.  Stable findings of enlarged cardiac silhouette and pulmonary venous congestion without frank evidence of pulmonary edema.   Original Report Authenticated By: Tacey Ruiz, MD    Dg Pelvis 1-2 Views  02/27/2012  *RADIOLOGY REPORT*  Clinical Data: FALL TODAY. PT C/O PELVIC PAIN AND LT LATERAL AND POSTERIOR RIB PAIN.  PELVIS - 1-2 VIEW  Comparison: 06/24/2011  Findings: Vascular calcifications noted.  Lower lumbar facet arthropathy noted.  No fracture or acute bony findings.  IMPRESSION:  1.  No acute bony findings.  Lower lumbar facet arthropathy. 2.  Atherosclerosis.   Original Report Authenticated By: Gaylyn Rong, M.D.    Ct Head Wo Contrast  02/27/2012  *RADIOLOGY REPORT*  Clinical Data:  Larey Seat backwards, posterior head and neck pain  CT HEAD WITHOUT CONTRAST CT CERVICAL SPINE WITHOUT CONTRAST  Technique:  Multidetector CT imaging of the head and cervical spine was performed following the standard protocol without intravenous contrast.  Multiplanar CT image reconstructions of the cervical spine were also generated.  Comparison:   05/26/2008  CT HEAD  Findings: Generalized atrophy. Normal ventricular morphology. No midline shift or mass effect. Otherwise normal appearance of brain parenchyma. No mass lesion or evidence of acute infarction. New small focus of extra-axial high attenuation is identified at the high right frontal region 14 x 8 mm in size, measuring 73 Hounsfield units average attenuation, most consistent with a small focus of acute subarachnoid hemorrhage. No additional extra-axial blood identified. Mucosal thickening left maxillary  sinus. Skull intact.  IMPRESSION: Generalized atrophy. Small focus of acute subarachnoid hemorrhage at the high right frontal region.  CT CERVICAL SPINE  Findings: Visualized skull base intact. Scattered atherosclerotic calcifications of the carotid systems. Small bilateral pleural effusions noted at lung apices. Vertebral body and disc space heights maintained. Tiny anterior spurs at C6-C7 endplates. Prevertebral soft tissues normal thickness. No acute fracture, subluxation or bone destruction. Small soft tissue calcification in the posterior cervical region incidentally noted.  IMPRESSION: No acute cervical spine abnormalities.  Critical Value/emergent results were called by telephone at the time of interpretation on 02/27/2012 at 2048 hrs to Dr. Estell Harpin, who verbally acknowledged these results.   Original Report Authenticated By: Ulyses Southward, M.D.      1. Subarachnoid hematoma   2. Rib fractures   3. Fall    I spoke with dr. Venetia Maxon who agreed with transfer to [facility] to admit.  He also wanted to give 2 units of ffp in inr 2.  Dr. Janee Morn called and accepted the pt CRITICAL CARE Performed by: Elea Holtzclaw L   Total critical care time40  Critical care time was exclusive of separately billable procedures and treating other patients.  Critical care was necessary to treat or prevent imminent or life-threatening deterioration.  Critical care was time spent personally by me on the following activities: development of treatment plan with patient and/or surrogate as well as nursing, discussions with consultants, evaluation of patient's response to treatment, examination of patient, obtaining history from patient or surrogate, ordering and performing treatments and interventions, ordering and review of laboratory studies, ordering and review of radiographic studies, pulse oximetry and re-evaluation of patient's condition.\   MDM  Benny Lennert, MD 02/27/12 2130

## 2012-02-28 ENCOUNTER — Encounter (HOSPITAL_COMMUNITY): Payer: Self-pay

## 2012-02-28 ENCOUNTER — Inpatient Hospital Stay (HOSPITAL_COMMUNITY): Payer: Medicare Other

## 2012-02-28 DIAGNOSIS — W19XXXA Unspecified fall, initial encounter: Secondary | ICD-10-CM | POA: Diagnosis present

## 2012-02-28 DIAGNOSIS — S066XAA Traumatic subarachnoid hemorrhage with loss of consciousness status unknown, initial encounter: Secondary | ICD-10-CM | POA: Diagnosis present

## 2012-02-28 DIAGNOSIS — S066X9A Traumatic subarachnoid hemorrhage with loss of consciousness of unspecified duration, initial encounter: Secondary | ICD-10-CM

## 2012-02-28 DIAGNOSIS — S2239XA Fracture of one rib, unspecified side, initial encounter for closed fracture: Secondary | ICD-10-CM

## 2012-02-28 DIAGNOSIS — S2242XA Multiple fractures of ribs, left side, initial encounter for closed fracture: Secondary | ICD-10-CM | POA: Diagnosis present

## 2012-02-28 LAB — CBC
MCHC: 32.6 g/dL (ref 30.0–36.0)
Platelets: 136 10*3/uL — ABNORMAL LOW (ref 150–400)
RDW: 14 % (ref 11.5–15.5)
WBC: 10.5 10*3/uL (ref 4.0–10.5)

## 2012-02-28 LAB — BASIC METABOLIC PANEL
Chloride: 100 mEq/L (ref 96–112)
GFR calc Af Amer: >90 mL/min (ref >90)
GFR calc non Af Amer: 88 mL/min — ABNORMAL LOW (ref >90)
Potassium: 3.8 mEq/L (ref 3.5–5.1)

## 2012-02-28 LAB — GLUCOSE, CAPILLARY
Glucose-Capillary: 137 mg/dL — ABNORMAL HIGH (ref 70–99)
Glucose-Capillary: 202 mg/dL — ABNORMAL HIGH (ref 70–99)

## 2012-02-28 LAB — PROTIME-INR
INR: 1.63 — ABNORMAL HIGH (ref 0.00–1.49)
Prothrombin Time: 18.8 seconds — ABNORMAL HIGH (ref 11.6–15.2)

## 2012-02-28 LAB — HEMOGLOBIN A1C: Hgb A1c MFr Bld: 8.1 % — ABNORMAL HIGH (ref <5.7)

## 2012-02-28 MED ORDER — ONDANSETRON HCL 4 MG PO TABS: 4.0000 mg | ORAL_TABLET | Freq: Four times a day (QID) | ORAL | Status: DC | PRN

## 2012-02-28 MED ORDER — INSULIN ASPART 100 UNIT/ML ~~LOC~~ SOLN
0.0000 [IU] | Freq: Three times a day (TID) | SUBCUTANEOUS | Status: DC
  Administered 2012-02-28: 3 [IU] via SUBCUTANEOUS

## 2012-02-28 MED ORDER — DILTIAZEM HCL ER COATED BEADS 180 MG PO CP24
180.0000 mg | ORAL_CAPSULE | Freq: Every day | ORAL | Status: DC
  Administered 2012-02-28: 180 mg via ORAL
  Filled 2012-02-28: qty 1

## 2012-02-28 MED ORDER — OXYCODONE HCL 5 MG PO TABS: 5.0000 mg | ORAL_TABLET | ORAL | Status: DC | PRN

## 2012-02-28 MED ORDER — DIGOXIN 250 MCG PO TABS: 0.2500 mg | ORAL_TABLET | Freq: Every day | ORAL | Status: DC

## 2012-02-28 MED ORDER — OXYCODONE HCL 5 MG PO TABS: 10.0000 mg | ORAL_TABLET | ORAL | Status: DC | PRN

## 2012-02-28 MED ORDER — IPRATROPIUM-ALBUTEROL 18-103 MCG/ACT IN AERO
2.0000 | INHALATION_SPRAY | Freq: Four times a day (QID) | RESPIRATORY_TRACT | Status: DC
  Administered 2012-02-28: 2 via RESPIRATORY_TRACT
  Filled 2012-02-28: qty 14.7

## 2012-02-28 MED ORDER — CARVEDILOL 12.5 MG PO TABS: 12.5000 mg | ORAL_TABLET | Freq: Two times a day (BID) | ORAL | Status: DC

## 2012-02-28 MED ORDER — PANTOPRAZOLE SODIUM 40 MG PO TBEC
40.0000 mg | DELAYED_RELEASE_TABLET | Freq: Every day | ORAL | Status: DC
  Administered 2012-02-28: 40 mg via ORAL
  Filled 2012-02-28: qty 1

## 2012-02-28 MED ORDER — PANTOPRAZOLE SODIUM 40 MG IV SOLR
40.0000 mg | Freq: Every day | INTRAVENOUS | Status: DC
  Filled 2012-02-28: qty 40

## 2012-02-28 MED ORDER — ACETAMINOPHEN 325 MG PO TABS: 650.0000 mg | ORAL_TABLET | ORAL | Status: DC | PRN

## 2012-02-28 MED ORDER — DIGOXIN 250 MCG PO TABS
0.2500 mg | ORAL_TABLET | Freq: Every day | ORAL | Status: DC
  Administered 2012-02-28: 0.25 mg via ORAL
  Filled 2012-02-28: qty 1

## 2012-02-28 MED ORDER — DILTIAZEM HCL ER COATED BEADS 180 MG PO CP24: 180.0000 mg | ORAL_CAPSULE | Freq: Every day | ORAL | Status: DC

## 2012-02-28 MED ORDER — HYDROMORPHONE HCL PF 1 MG/ML IJ SOLN
0.5000 mg | INTRAMUSCULAR | Status: DC | PRN
  Administered 2012-02-28 (×2): 0.5 mg via INTRAVENOUS
  Filled 2012-02-28 (×2): qty 1

## 2012-02-28 MED ORDER — MAGNESIUM OXIDE 400 (241.3 MG) MG PO TABS
400.0000 mg | ORAL_TABLET | Freq: Two times a day (BID) | ORAL | Status: DC
  Administered 2012-02-28: 400 mg via ORAL
  Filled 2012-02-28 (×2): qty 1

## 2012-02-28 MED ORDER — ONDANSETRON HCL 4 MG/2ML IJ SOLN: 4.0000 mg | Freq: Four times a day (QID) | INTRAMUSCULAR | Status: DC | PRN

## 2012-02-28 MED ORDER — TRAMADOL HCL 50 MG PO TABS: 50.0000 mg | ORAL_TABLET | Freq: Four times a day (QID) | ORAL | Status: DC | PRN

## 2012-02-28 MED ORDER — POTASSIUM CHLORIDE IN NACL 20-0.9 MEQ/L-% IV SOLN
INTRAVENOUS | Status: DC
  Administered 2012-02-28: 02:00:00 via INTRAVENOUS
  Filled 2012-02-28 (×3): qty 1000

## 2012-02-28 MED ORDER — PRAVASTATIN SODIUM 40 MG PO TABS: 40.0000 mg | ORAL_TABLET | Freq: Every day | ORAL | Status: DC

## 2012-02-28 MED ORDER — CARVEDILOL 12.5 MG PO TABS
12.5000 mg | ORAL_TABLET | Freq: Two times a day (BID) | ORAL | Status: DC
  Administered 2012-02-28: 12.5 mg via ORAL
  Filled 2012-02-28 (×3): qty 1

## 2012-02-28 NOTE — Discharge Summary (Signed)
Physician Discharge Summary  Patient ID: [name] MRN: [id number] DOB/AGE: 05/04/1934 77 y.o.  Admit date: 02/27/2012 Discharge date: 02/28/2012  Discharge Diagnoses Patient Active Problem List   Diagnosis Date Noted  . Fall 02/28/2012  . Traumatic subarachnoid hemorrhage 02/28/2012  . Multiple fractures of ribs of left side 02/28/2012  . Hyperkalemia 06/25/2011  . Ureterolithiasis 06/25/2011  . Laboratory test 04/29/2011  . COPD (chronic obstructive pulmonary disease)   . Chronic anticoagulation   . Atrial fibrillation 02/15/2011  . DIABETES MELLITUS, TYPE II 10/07/2008  . OBSTRUCTIVE SLEEP APNEA 10/07/2008  . Arteriosclerotic cardiovascular disease (ASCVD) 10/07/2008    Consultants Dr. Maeola Harman for neurosurgery   Procedures None   HPI: Patient was driving and he felt a very urgent need to have a bowel movement. He did not feel he could make it to a restroom. He pulled the car over and walked down into the woods. He sat down to go to the bathroom and he fell over backwards striking his head and left side. His grandson came down and help him. He did not have a loss of consciousness. He went to [facility] emergency department for evaluation. He was found to have right frontal subarachnoid hemorrhage and fractures of the left third, fifth, sixth, and seventh rib. Accepted him in transfer to the trauma service. He was seen by Dr. Venetia Maxon from neurosurgery. In light of his subtherapeutic INR, Dr. Venetia Maxon recommends allowing that to drift down gradually and checking followup CT in the morning.   Hospital Course: [name] did very well in the hospital. His pain was controlled and his follow-up CT was improved. He was evaluated by physical therapy and passed with flying colors. He never suffered any neurologic or respiratory compromise. He was able to be discharged in stable condition.   The patient mentions he has an appointment next week with his PCP. In light of his subtherapeutic  INR on admission as well as his history of subtherapeutic INR's we recommend he discuss with his PCP his current treatment with coumadin and whether it needs to be adjusted or altered. Neurosurgery is comfortable with starting anticoagulation again at the time of that appointment.      Medication List     As of 02/28/2012  3:43 PM    STOP taking these medications         warfarin 4 MG tablet   Commonly known as: COUMADIN      TAKE these medications         carvedilol 12.5 MG tablet   Commonly known as: COREG   Take 1 tablet (12.5 mg total) by mouth 2 (two) times daily with a meal.      cephALEXin 500 MG capsule   Commonly known as: KEFLEX   Take 1 capsule (500 mg total) by mouth 3 (three) times daily.      digoxin 0.25 MG tablet   Commonly known as: LANOXIN   Take 1 tablet (0.25 mg total) by mouth daily.      diltiazem 180 MG 24 hr capsule   Commonly known as: CARDIZEM CD   Take 1 capsule (180 mg total) by mouth daily.      FISH OIL BURP-LESS 1000 MG Caps   Take 1,000 mg by mouth daily.      magnesium oxide 400 MG tablet   Commonly known as: MAG-OX   Take 400 mg by mouth 2 (two) times daily.      metFORMIN 500 MG tablet   Commonly  known as: GLUCOPHAGE   Take 500 mg by mouth 2 (two) times daily with a meal.      pravastatin 40 MG tablet   Commonly known as: PRAVACHOL   Take 1 tablet (40 mg total) by mouth at bedtime.      traMADol 50 MG tablet   Commonly known as: ULTRAM   Take 1-2 tablets (50-100 mg total) by mouth every 6 (six) hours as needed for pain.      vitamin E 400 UNIT capsule   Take 400 Units by mouth daily.      WELCHOL 3.75 G Pack   Generic drug: Colesevelam HCl   Take 3.75 g by mouth daily.             Follow-up Information    Follow up with DONDIEGO,[name] M, MD. Schedule an appointment as soon as possible for a visit in 2 weeks.   Contact information:   [address] [place] [address] [phone]       Call [facility]. (As needed)    Contact information:   [address] [place] [address] [phone]          Signed: Freeman Caldron, PA-C Pager: [phone] [facility] Pager: [phone]  02/28/2012, 3:43 PM

## 2012-02-28 NOTE — Progress Notes (Signed)
UR completed 

## 2012-02-28 NOTE — Progress Notes (Signed)
Subjective: Complains of soreness over left ribs. Otherwise he seems to be ok and he wants to go home  Objective: Vital signs in last 24 hours: Temp:  [97.7 F (36.5 C)-98.2 F (36.8 C)] 98.2 F (36.8 C) (11/21 2221) Pulse Rate:  [63-87] 65  (11/22 0700) Resp:  [9-20] 10  (11/22 0700) BP: (108-148)/(60-90) 110/78 mmHg (11/22 0700) SpO2:  [93 %-100 %] 100 % (11/22 0700) Weight:  [199 lb 11.8 oz (90.6 kg)] 199 lb 11.8 oz (90.6 kg) (11/21 2300)    Intake/Output from previous day: 11/21 0701 - 11/22 0700 In: 60 [I.V.:60] Out: 200 [Urine:200] Intake/Output this shift:    Resp: clear to auscultation bilaterally GI: soft, non-tender; bowel sounds normal; no masses,  no organomegaly  Lab Results:   [facility] 02/28/12 0425 02/27/12 2115  WBC 10.5 8.2  HGB 13.4 13.7  HCT 41.1 41.4  PLT 136* 146*   BMET  Basename 02/28/12 0425 02/27/12 2115  NA 135 135  K 3.8 4.0  CL 100 99  CO2 25 27  GLUCOSE 164* 263*  BUN 12 14  CREATININE 0.75 0.92  CALCIUM 8.8 9.4   PT/INR  Basename 02/28/12 0425 02/27/12 2115  LABPROT 18.8* 18.4*  INR 1.63* 1.58*   ABG No results found for this basename: PHART:2,PCO2:2,PO2:2,HCO3:2 in the last 72 hours  Studies/Results: Dg Ribs Unilateral W/chest Left  02/27/2012  *RADIOLOGY REPORT*  Clinical Data: Post fall, now with pelvic left lateral and posterior rib pain  LEFT RIBS AND CHEST - 3+ VIEW  Comparison: 02/02/2012; 01/15/2011; 05/26/2008  Findings:  Grossly unchanged enlarged cardiac silhouette and mediastinal contours with atherosclerotic calcifications within the thoracic aorta.  Pulmonary venous congestion without frank evidence of edema.  Grossly unchanged perihilar and bibasilar opacities.  No pleural effusion or pneumothorax.  Nondisplaced fractures of the lateral aspect of the left third, fifth, sixth and seventh ribs.  IMPRESSION: 1.  Nondisplaced fractures of the lateral aspect of the left third, fifth, sixth and seventh ribs. 2.   Stable findings of enlarged cardiac silhouette and pulmonary venous congestion without frank evidence of pulmonary edema.   Original Report Authenticated By: Tacey Ruiz, MD    Dg Pelvis 1-2 Views  02/27/2012  *RADIOLOGY REPORT*  Clinical Data: FALL TODAY. PT C/O PELVIC PAIN AND LT LATERAL AND POSTERIOR RIB PAIN.  PELVIS - 1-2 VIEW  Comparison: 06/24/2011  Findings: Vascular calcifications noted.  Lower lumbar facet arthropathy noted.  No fracture or acute bony findings.  IMPRESSION:  1.  No acute bony findings.  Lower lumbar facet arthropathy. 2.  Atherosclerosis.   Original Report Authenticated By: Gaylyn Rong, M.D.    Ct Head Wo Contrast  02/27/2012  *RADIOLOGY REPORT*  Clinical Data:  Larey Seat backwards, posterior head and neck pain  CT HEAD WITHOUT CONTRAST CT CERVICAL SPINE WITHOUT CONTRAST  Technique:  Multidetector CT imaging of the head and cervical spine was performed following the standard protocol without intravenous contrast.  Multiplanar CT image reconstructions of the cervical spine were also generated.  Comparison:   05/26/2008  CT HEAD  Findings: Generalized atrophy. Normal ventricular morphology. No midline shift or mass effect. Otherwise normal appearance of brain parenchyma. No mass lesion or evidence of acute infarction. New small focus of extra-axial high attenuation is identified at the high right frontal region 14 x 8 mm in size, measuring 73 Hounsfield units average attenuation, most consistent with a small focus of acute subarachnoid hemorrhage. No additional extra-axial blood identified. Mucosal thickening left maxillary sinus. Skull intact.  IMPRESSION: Generalized atrophy. Small focus of acute subarachnoid hemorrhage at the high right frontal region.  CT CERVICAL SPINE  Findings: Visualized skull base intact. Scattered atherosclerotic calcifications of the carotid systems. Small bilateral pleural effusions noted at lung apices. Vertebral body and disc space heights maintained.  Tiny anterior spurs at C6-C7 endplates. Prevertebral soft tissues normal thickness. No acute fracture, subluxation or bone destruction. Small soft tissue calcification in the posterior cervical region incidentally noted.  IMPRESSION: No acute cervical spine abnormalities.  Critical Value/emergent results were called by telephone at the time of interpretation on 02/27/2012 at 2048 hrs to Dr. Estell Harpin, who verbally acknowledged these results.   Original Report Authenticated By: Ulyses Southward, M.D.     Anti-infectives: Anti-infectives    None      Assessment/Plan: s/p * No surgery found * Advance diet PT eval to make sure he is strong and steady on his feet SSI for his diabetes If he does well it may be possible for him to be discharged later today  LOS: 1 day    TOTH III,PAUL S 02/28/2012

## 2012-02-28 NOTE — Progress Notes (Signed)
Subjective: Patient reports "I've felt bad, I've felt worse, but right now I feel ok"  Objective: Vital signs in last 24 hours: Temp:  [97.7 F (36.5 C)-98.2 F (36.8 C)] 98.2 F (36.8 C) (11/21 2221) Pulse Rate:  [63-87] 65  (11/22 0700) Resp:  [9-20] 10  (11/22 0700) BP: (108-148)/(60-90) 110/78 mmHg (11/22 0700) SpO2:  [93 %-100 %] 100 % (11/22 0700) Weight:  [90.6 kg (199 lb 11.8 oz)] 90.6 kg (199 lb 11.8 oz) (11/21 2300)  Intake/Output from previous day: 11/21 0701 - 11/22 0700 In: 60 [I.V.:60] Out: 200 [Urine:200] Intake/Output this shift:    Awakens to voice. Reports no pain. PEARL. No drift. MAEW. CT reviewed by Dr. Venetia Maxon - stable.  Lab Results:  Basename 02/28/12 0425 02/27/12 2115  WBC 10.5 8.2  HGB 13.4 13.7  HCT 41.1 41.4  PLT 136* 146*   BMET  Basename 02/28/12 0425 02/27/12 2115  NA 135 135  K 3.8 4.0  CL 100 99  CO2 25 27  GLUCOSE 164* 263*  BUN 12 14  CREATININE 0.75 0.92  CALCIUM 8.8 9.4    Studies/Results: Dg Ribs Unilateral W/chest Left  02/27/2012  *RADIOLOGY REPORT*  Clinical Data: Post fall, now with pelvic left lateral and posterior rib pain  LEFT RIBS AND CHEST - 3+ VIEW  Comparison: 02/02/2012; 01/15/2011; 05/26/2008  Findings:  Grossly unchanged enlarged cardiac silhouette and mediastinal contours with atherosclerotic calcifications within the thoracic aorta.  Pulmonary venous congestion without frank evidence of edema.  Grossly unchanged perihilar and bibasilar opacities.  No pleural effusion or pneumothorax.  Nondisplaced fractures of the lateral aspect of the left third, fifth, sixth and seventh ribs.  IMPRESSION: 1.  Nondisplaced fractures of the lateral aspect of the left third, fifth, sixth and seventh ribs. 2.  Stable findings of enlarged cardiac silhouette and pulmonary venous congestion without frank evidence of pulmonary edema.   Original Report Authenticated By: Tacey Ruiz, MD    Dg Pelvis 1-2 Views  02/27/2012  *RADIOLOGY  REPORT*  Clinical Data: FALL TODAY. PT C/O PELVIC PAIN AND LT LATERAL AND POSTERIOR RIB PAIN.  PELVIS - 1-2 VIEW  Comparison: 06/24/2011  Findings: Vascular calcifications noted.  Lower lumbar facet arthropathy noted.  No fracture or acute bony findings.  IMPRESSION:  1.  No acute bony findings.  Lower lumbar facet arthropathy. 2.  Atherosclerosis.   Original Report Authenticated By: Gaylyn Rong, M.D.    Ct Head Wo Contrast  02/27/2012  *RADIOLOGY REPORT*  Clinical Data:  Larey Seat backwards, posterior head and neck pain  CT HEAD WITHOUT CONTRAST CT CERVICAL SPINE WITHOUT CONTRAST  Technique:  Multidetector CT imaging of the head and cervical spine was performed following the standard protocol without intravenous contrast.  Multiplanar CT image reconstructions of the cervical spine were also generated.  Comparison:   05/26/2008  CT HEAD  Findings: Generalized atrophy. Normal ventricular morphology. No midline shift or mass effect. Otherwise normal appearance of brain parenchyma. No mass lesion or evidence of acute infarction. New small focus of extra-axial high attenuation is identified at the high right frontal region 14 x 8 mm in size, measuring 73 Hounsfield units average attenuation, most consistent with a small focus of acute subarachnoid hemorrhage. No additional extra-axial blood identified. Mucosal thickening left maxillary sinus. Skull intact.  IMPRESSION: Generalized atrophy. Small focus of acute subarachnoid hemorrhage at the high right frontal region.  CT CERVICAL SPINE  Findings: Visualized skull base intact. Scattered atherosclerotic calcifications of the carotid systems. Small bilateral pleural  effusions noted at lung apices. Vertebral body and disc space heights maintained. Tiny anterior spurs at C6-C7 endplates. Prevertebral soft tissues normal thickness. No acute fracture, subluxation or bone destruction. Small soft tissue calcification in the posterior cervical region incidentally noted.   IMPRESSION: No acute cervical spine abnormalities.  Critical Value/emergent results were called by telephone at the time of interpretation on 02/27/2012 at 2048 hrs to Dr. Estell Harpin, who verbally acknowledged these results.   Original Report Authenticated By: Ulyses Southward, M.D.     Assessment/Plan: Improving  LOS: 1 day  Continue supportive care. Stable neurologically; NS will sign off. Patient will need to review anticoagulation with his PCP (Dr. Delbert Harness).  He has been sub-therapeutic on coumadin, but states he is compliant with medication usage.  Because of fall, he may need to continue on sub-therapeutic dosing, but if falls infrequent, then probably should go on higher dose of coumadin. An alternative option would be xeralto or another newer agent for which blood levels are monitored.    [name] 02/28/2012, 7:56 AM

## 2012-02-28 NOTE — H&P (Addendum)
[name] is an 77 y.o. male.   Chief Complaint: L rib pain HPI: Patient was driving and he felt a very urgent need to have a bowel movement. He did not feel he could make it to a restroom. He pulled the car over and walked down into the woods. He sat down to go to the bathroom and he fell over backwards striking his head and left side. His grandson came down and help him. He did not have a loss of consciousness. He went to [facility] emergency department for evaluation. He was found to have right frontal subarachnoid hemorrhage and fractures of the left third, Fifth, sixth, and seventh rib.  Accepted him in transfer to the trauma service. He was seen by Dr. Venetia Maxon from neurosurgery. In light of his subtherapeutic INR, Dr. Venetia Maxon recommends allowing that to drift down gradually and checking followup CT in the morning.  Past Medical History  Diagnosis Date  . Arteriosclerotic cardiovascular disease (ASCVD)   . Diabetes mellitus   . Atrial fibrillation     Onset in 2012  . COPD (chronic obstructive pulmonary disease)   . Gout   . DJD (degenerative joint disease)   . Hyperlipidemia   . Obstructive sleep apnea   . Chronic anticoagulation 2012    2012  . Myocardial infarction   . CHF (congestive heart failure)     Past Surgical History  Procedure Date  . Cholecystectomy   . Knee surgery   . Vasectomy   . Cystoscopy/retrograde/ureteroscopy 06/28/2011    Procedure: CYSTOSCOPY/RETROGRADE/URETEROSCOPY;  Surgeon: Ky Barban, MD;  Location: [facility];  Service: Urology;  Laterality: Right;  . Stone extraction with basket 06/28/2011    Procedure: STONE EXTRACTION WITH BASKET;  Surgeon: Ky Barban, MD;  Location: [facility];  Service: Urology;  Laterality: Right;  specimen given to family per MD    Family History  Problem Relation Age of Onset  . Heart failure Mother   . Heart failure Father    Social History:  reports that he quit smoking about 19 years ago. His smoking use included  Cigarettes. He has a 40 pack-year smoking history. He has never used smokeless tobacco. He reports that he does not drink alcohol or use illicit drugs.  Allergies:  Allergies  Allergen Reactions  . Procaine Hcl     Medications Prior to Admission  Medication Sig Dispense Refill  . warfarin (COUMADIN) 4 MG tablet Take 2-4 mg by mouth daily. Patient takes 1 tablet daily then 1/2 tablet the next day then 1/2 tablet the next day then bact to 1 tablet      . carvedilol (COREG) 12.5 MG tablet Take 1 tablet (12.5 mg total) by mouth 2 (two) times daily with a meal.  60 tablet  5  . cephALEXin (KEFLEX) 500 MG capsule Take 1 capsule (500 mg total) by mouth 3 (three) times daily.  21 capsule  0  . digoxin (LANOXIN) 0.25 MG tablet Take 1 tablet (0.25 mg total) by mouth daily.  30 tablet  3  . diltiazem (CARDIZEM CD) 180 MG 24 hr capsule Take 1 capsule (180 mg total) by mouth daily.  30 capsule  5  . magnesium oxide (MAG-OX) 400 MG tablet Take 400 mg by mouth 2 (two) times daily.        . metFORMIN (GLUCOPHAGE) 500 MG tablet Take 500 mg by mouth 2 (two) times daily with a meal.        . Omega-3 Fatty Acids (  FISH OIL BURP-LESS) 1000 MG CAPS Take 1,000 mg by mouth daily.        . pravastatin (PRAVACHOL) 40 MG tablet Take 40 mg by mouth at bedtime.      . vitamin E 400 UNIT capsule Take 400 Units by mouth daily.        Lilian Kapur 3.75 G PACK Take 3.75 g by mouth daily.         Results for orders placed during the hospital encounter of 02/27/12 (from the past 48 hour(s))  CBC WITH DIFFERENTIAL     Status: Abnormal   Collection Time   02/27/12  9:15 PM      Component Value Range Comment   WBC 8.2  4.0 - 10.5 K/uL    RBC 4.25  4.22 - 5.81 MIL/uL    Hemoglobin 13.7  13.0 - 17.0 g/dL    HCT 40.9  81.1 - 91.4 %    MCV 97.4  78.0 - 100.0 fL    MCH 32.2  26.0 - 34.0 pg    MCHC 33.1  30.0 - 36.0 g/dL    RDW 78.2  95.6 - 21.3 %    Platelets 146 (*) 150 - 400 K/uL    Neutrophils Relative 73  43 - 77 %     Neutro Abs 6.0  1.7 - 7.7 K/uL    Lymphocytes Relative 17  12 - 46 %    Lymphs Abs 1.4  0.7 - 4.0 K/uL    Monocytes Relative 9  3 - 12 %    Monocytes Absolute 0.8  0.1 - 1.0 K/uL    Eosinophils Relative 1  0 - 5 %    Eosinophils Absolute 0.1  0.0 - 0.7 K/uL    Basophils Relative 0  0 - 1 %    Basophils Absolute 0.0  0.0 - 0.1 K/uL   BASIC METABOLIC PANEL     Status: Abnormal   Collection Time   02/27/12  9:15 PM      Component Value Range Comment   Sodium 135  135 - 145 mEq/L    Potassium 4.0  3.5 - 5.1 mEq/L    Chloride 99  96 - 112 mEq/L    CO2 27  19 - 32 mEq/L    Glucose, Bld 263 (*) 70 - 99 mg/dL    BUN 14  6 - 23 mg/dL    Creatinine, Ser 0.86  0.50 - 1.35 mg/dL    Calcium 9.4  8.4 - 57.8 mg/dL    GFR calc non Af Amer 80 (*) >90 mL/min    GFR calc Af Amer >90  >90 mL/min   PROTIME-INR     Status: Abnormal   Collection Time   02/27/12  9:15 PM      Component Value Range Comment   Prothrombin Time 18.4 (*) 11.6 - 15.2 seconds    INR 1.58 (*) 0.00 - 1.49    Dg Ribs Unilateral W/chest Left  02/27/2012  *RADIOLOGY REPORT*  Clinical Data: Post fall, now with pelvic left lateral and posterior rib pain  LEFT RIBS AND CHEST - 3+ VIEW  Comparison: 02/02/2012; 01/15/2011; 05/26/2008  Findings:  Grossly unchanged enlarged cardiac silhouette and mediastinal contours with atherosclerotic calcifications within the thoracic aorta.  Pulmonary venous congestion without frank evidence of edema.  Grossly unchanged perihilar and bibasilar opacities.  No pleural effusion or pneumothorax.  Nondisplaced fractures of the lateral aspect of the left third, fifth, sixth and seventh ribs.  IMPRESSION: 1.  Nondisplaced fractures of the lateral aspect of the left third, fifth, sixth and seventh ribs. 2.  Stable findings of enlarged cardiac silhouette and pulmonary venous congestion without frank evidence of pulmonary edema.   Original Report Authenticated By: Tacey Ruiz, MD    Dg Pelvis 1-2  Views  02/27/2012  *RADIOLOGY REPORT*  Clinical Data: FALL TODAY. PT C/O PELVIC PAIN AND LT LATERAL AND POSTERIOR RIB PAIN.  PELVIS - 1-2 VIEW  Comparison: 06/24/2011  Findings: Vascular calcifications noted.  Lower lumbar facet arthropathy noted.  No fracture or acute bony findings.  IMPRESSION:  1.  No acute bony findings.  Lower lumbar facet arthropathy. 2.  Atherosclerosis.   Original Report Authenticated By: Gaylyn Rong, M.D.    Ct Head Wo Contrast  02/27/2012  *RADIOLOGY REPORT*  Clinical Data:  Larey Seat backwards, posterior head and neck pain  CT HEAD WITHOUT CONTRAST CT CERVICAL SPINE WITHOUT CONTRAST  Technique:  Multidetector CT imaging of the head and cervical spine was performed following the standard protocol without intravenous contrast.  Multiplanar CT image reconstructions of the cervical spine were also generated.  Comparison:   05/26/2008  CT HEAD  Findings: Generalized atrophy. Normal ventricular morphology. No midline shift or mass effect. Otherwise normal appearance of brain parenchyma. No mass lesion or evidence of acute infarction. New small focus of extra-axial high attenuation is identified at the high right frontal region 14 x 8 mm in size, measuring 73 Hounsfield units average attenuation, most consistent with a small focus of acute subarachnoid hemorrhage. No additional extra-axial blood identified. Mucosal thickening left maxillary sinus. Skull intact.  IMPRESSION: Generalized atrophy. Small focus of acute subarachnoid hemorrhage at the high right frontal region.  CT CERVICAL SPINE  Findings: Visualized skull base intact. Scattered atherosclerotic calcifications of the carotid systems. Small bilateral pleural effusions noted at lung apices. Vertebral body and disc space heights maintained. Tiny anterior spurs at C6-C7 endplates. Prevertebral soft tissues normal thickness. No acute fracture, subluxation or bone destruction. Small soft tissue calcification in the posterior cervical  region incidentally noted.  IMPRESSION: No acute cervical spine abnormalities.  Critical Value/emergent results were called by telephone at the time of interpretation on 02/27/2012 at 2048 hrs to Dr. Estell Harpin, who verbally acknowledged these results.   Original Report Authenticated By: Ulyses Southward, M.D.     Review of Systems  Constitutional: Negative.   HENT:       HA  Eyes: Negative.   Respiratory:       See above  Cardiovascular: Positive for chest pain.       Left chest pain with inspiration  Gastrointestinal:       Urgency to have a bowel movement after eating since his gallbladder was removed  Genitourinary:       History of kidney stones  Musculoskeletal:       Multiple abrasions  bilateral upper extremities, bilateral thighs  Skin: Negative.   Neurological: Negative.   Endo/Heme/Allergies: Bruises/bleeds easily.    Blood pressure 119/76, pulse 75, temperature 98.2 F (36.8 C), temperature source Oral, resp. rate 19, height 5\' 5"  (1.651 m), weight 90.6 kg (199 lb 11.8 oz), SpO2 96.00%. Physical Exam  Constitutional: He appears well-developed and well-nourished. No distress.  HENT:  Mouth/Throat: No oropharyngeal exudate.       Small abrasion left scalp  Eyes: EOM are normal. Pupils are equal, round, and reactive to light. Right eye exhibits no discharge. Left eye exhibits no discharge. No scleral icterus.  Neck: Normal range of motion. Neck  supple. No tracheal deviation present. No thyromegaly present.  Cardiovascular: Intact distal pulses.   No murmur heard.      Irregularly irregular  Respiratory: Effort normal. No stridor. No respiratory distress. He has no wheezes. He has no rales.       Left chest wall tenderness  GI: Soft. He exhibits no distension. There is no tenderness. There is no rebound and no guarding.  Musculoskeletal: Normal range of motion.       Multiple abrasions right hand, left elbow, bilateral proximal thighs, left calf  Neurological: He has normal  strength. No sensory deficit. GCS eye subscore is 4. GCS verbal subscore is 5. GCS motor subscore is 6.  Skin: He is not diaphoretic.       See above     Assessment/Plan Status post fall with left rib fractures3, 5-7, Traumatic brain injury with Right frontal subarachnoid hemorrhage. Plan admission to the neurosurgical intensive care unit. Appreciate Dr. Fredrich Birks consultation. He recommends allowing INR to normalize gradually and followup CT scan of the head in the morning. Patient will need pulmonary toilet for his rib fractures. We will continue home medications as well with the exception of Coumadin.  Maelyn Berrey E 02/28/2012, 12:44 AM

## 2012-02-28 NOTE — Clinical Social Work Note (Signed)
Clinical Social Work Department BRIEF PSYCHOSOCIAL ASSESSMENT 02/28/2012  Patient:  [name], [name]     Account Number:  [id number]     Admit date:  02/27/2012  Clinical Social Worker:  Pearson Forster  Date/Time:  02/28/2012 03:15 PM  Referred by:  Physician  Date Referred:  02/28/2012 Referred for  Psychosocial assessment   Other Referral:   Interview type:  Patient Other interview type:    PSYCHOSOCIAL DATA Living Status:  WIFE Admitted from facility:   Level of care:   Primary support name:  Llanas,Margaret  [phone] Primary support relationship to patient:  SPOUSE Degree of support available:   Adequate    CURRENT CONCERNS Current Concerns  None Noted   Other Concerns:   Patient plans to discharge today    SOCIAL WORK ASSESSMENT / PLAN Clinical Social Worker met with patient at bedside to offer support and discuss patient plans at discharge.  Patient states that he was on his way home from his cousins funeral when they stopped to eat dinner at [facility].  Patient was driving from [employer] back home when he felt a sudden urge to go to bathroom and had to pull over.  Patient went to squat on the side of the road when he fell backwards down an embankment.  Patient states that ever since he had his gallbladder out 4-5 years ago "when I gotta go, I gotta go."  Patient family was in the car and assisted patient back up the embankment and into the car.  Patient drove himself and the family to [facility] to be checked out. Patient was then transferred to [facility] for neurological observation.  Patient states "I feel stupid and next time I will carry toilet paper, flash light, and a rope."    Clinical Social Worker inquired about patient current substance use.  Patient clearly states, "I haven't had a drop of alcohol since 1989 and I haven't had a single cigarette since 1994.  Patient is proud of his accomplishments and his family has been a big supporter of his decisions.   SBIRT complete.  No resources needed at this time.  Patient with orders to discharge home and transportation is on the way.    Clinical Social Worker signing off due to no further social work needs identified at this time.   Assessment/plan status:  No Further Intervention Required Other assessment/ plan:   Information/referral to community resources:   Patient states that him and his family are very resourceful and have been successful this far.  Further resources declined.    PATIENT'S/FAMILY'S RESPONSE TO PLAN OF CARE: Patient alert and oriented sitting up in the chair. Patient is very animated with his story but feels "silly" for how it happened.  Patient with good family support. Patient has a son that he adopted when he was 6 and 2 grown daugthers.  Patient is proud of his family and feels that they will support him when he returns home.  Patient verbalized his appreciation for CSW support and involvement.    Macario Golds, [place] [phone]

## 2012-02-28 NOTE — Progress Notes (Signed)
Patient being discharged home. Patient given discharge instructions. Patient and family verbalizes understanding of discharge instructions. IV taken out and belongings sent with patient. Patient taken out via wheelchair by NT.

## 2012-02-28 NOTE — Evaluation (Signed)
Physical Therapy Evaluation Patient Details Name: [name] MRN: [id number] DOB: 20-Jan-1935 Today's Date: 02/28/2012 Time: 1340-1409 PT Time Calculation (min): 29 min  PT Assessment / Plan / Recommendation Clinical Impression  77 yo adm after circumstancial fall (in a ditch) with Rt frontal SAH and Lt rib fxs. Mobility only slightly impaired due to pain and is near baseline. OK to d/c home from PT standpoint. No followup needed.    PT Assessment  Patent does not need any further PT services    Follow Up Recommendations  No PT follow up    Does the patient have the potential to tolerate intense rehabilitation      Barriers to Discharge        Equipment Recommendations  None recommended by PT    Recommendations for Other Services     Frequency      Precautions / Restrictions Precautions Precautions: None   Pertinent Vitals/Pain Rib pain, not rated      Mobility  Bed Mobility Bed Mobility: Not assessed Details for Bed Mobility Assistance: Pt reports he used HOB elevated to exit hospital bed; discussed option of sleeping in a recliner vs transitioning sit to side and side to sit Transfers Transfers: Sit to Stand;Stand to Sit Sit to Stand: 7: Independent;Without upper extremity assist;From toilet Stand to Sit: 7: Independent;Without upper extremity assist;To chair/3-in-1 Ambulation/Gait Ambulation/Gait Assistance: 7: Independent Ambulation Distance (Feet): 150 Feet Assistive device: None Ambulation/Gait Assistance Details: wide base of support with incr lateral sway (as prior to injury per pt) Gait Pattern: Decreased stride length;Lateral trunk lean to right;Lateral trunk lean to left;Wide base of support    Shoulder Instructions     Exercises     PT Diagnosis:    PT Problem List:   PT Treatment Interventions:     PT Goals    Visit Information  Last PT Received On: 02/28/12 Assistance Needed: +1    Subjective Data  Subjective: Pt reports he feels no  difference in mobility except he is sore all over Patient Stated Goal: go home to his beautiful wife   Prior Functioning  Home Living Lives With: Spouse Available Help at Discharge: Family;Available 24 hours/day (wife can only provide supervision (and has dementia)) Type of Home: Mobile home Home Access: Stairs to enter Entrance Stairs-Number of Steps: 1 Entrance Stairs-Rails: None Home Layout: One level Bathroom Shower/Tub: Engineer, manufacturing systems: Standard Home Adaptive Equipment: Crutches;Straight cane Prior Function Level of Independence: Independent Able to Take Stairs?: Yes Driving: Yes 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) Communication Communication: No difficulties Dominant Hand: Right    Cognition  Overall Cognitive Status: Appears within functional limits for tasks assessed/performed Arousal/Alertness: Awake/alert Orientation Level: Oriented X4 / Intact Behavior During Session: [facility] for tasks performed    Extremity/Trunk Assessment Right Lower Extremity Assessment RLE ROM/Strength/Tone: [facility] for tasks assessed Left Lower Extremity Assessment LLE ROM/Strength/Tone: WFL for tasks assessed   Balance Balance Balance Assessed: Yes Standardized Balance Assessment Standardized Balance Assessment: Berg Balance Test Berg Balance Test Sit to Stand: Able to stand without using hands and stabilize independently Standing Unsupported: Able to stand safely 2 minutes Sitting with Back Unsupported but Feet Supported on Floor or Stool: Able to sit safely and securely 2 minutes Stand to Sit: Sits safely with minimal use of hands Transfers: Able to transfer safely, minor use of hands Standing Unsupported with Eyes Closed: Able to stand 10 seconds safely Standing Ubsupported with Feet Together: Able to place feet together independently but unable to hold for 30  seconds From Standing, Reach Forward with Outstretched Arm: Can reach confidently >25 cm (10") From Standing  Position, Pick up Object from Floor: Able to pick up shoe safely and easily From Standing Position, Turn to Look Behind Over each Shoulder: Looks behind from both sides and weight shifts well Turn 360 Degrees: Able to turn 360 degrees safely in 4 seconds or less Standing Unsupported, Alternately Place Feet on Step/Stool: Able to stand independently and safely and complete 8 steps in 20 seconds Standing Unsupported, One Foot in Front: Able to place foot tandem independently and hold 30 seconds Standing on One Leg: Tries to lift leg/unable to hold 3 seconds but remains standing independently Total Score: 51   End of Session PT - End of Session Equipment Utilized During Treatment: Gait belt Activity Tolerance: Patient tolerated treatment well Patient left: in chair;with call bell/phone within reach Nurse Communication: Mobility status;Other (comment) (Ok to d/c from PT standpoint)  GP     Zakhi Dupre 02/28/2012, 2:26 PM  Pager [phone]

## 2012-03-03 ENCOUNTER — Telehealth: Payer: Self-pay | Admitting: General Surgery

## 2012-03-03 ENCOUNTER — Telehealth (HOSPITAL_COMMUNITY): Payer: Self-pay | Admitting: General Surgery

## 2012-03-03 ENCOUNTER — Telehealth (INDEPENDENT_AMBULATORY_CARE_PROVIDER_SITE_OTHER): Payer: Self-pay

## 2012-03-03 NOTE — Telephone Encounter (Signed)
The daughter has called upset that her dad was discharged so quickly after a head injury.  He has broken ribs and has fallen twice.  She was concerned that a rib could puncture his lungs.  I offered to have someone from the trauma service call her.  She has already called [phone] and a message was sent to Meagan to call her.  I paged Dr Janee Morn to call her

## 2012-03-03 NOTE — Telephone Encounter (Signed)
I returned a call to his daughter regarding how he is doing.  She is very angry that he was discharged "too soon".  After a lengthy discussion, it seems the patient is now doing OK with his pain controlled on Tramadol.  MS OK.  She requests Dr. Lindie Spruce call her tomorrow to discuss the situation.  I strongly advised her to call EMS or take him to the ED if he has any problems.  I will ask Dr. Lindie Spruce to call her tomorrow.

## 2012-03-06 ENCOUNTER — Encounter (HOSPITAL_COMMUNITY): Payer: Self-pay | Admitting: Emergency Medicine

## 2012-03-06 ENCOUNTER — Emergency Department (HOSPITAL_COMMUNITY)
Admission: EM | Admit: 2012-03-06 | Discharge: 2012-03-06 | Disposition: A | Discharge status: Home / Self Care | Payer: Medicare Other | Attending: Emergency Medicine | Admitting: Emergency Medicine

## 2012-03-06 ENCOUNTER — Emergency Department (HOSPITAL_COMMUNITY): Payer: Medicare Other

## 2012-03-06 DIAGNOSIS — F0781 Postconcussional syndrome: Secondary | ICD-10-CM | POA: Insufficient documentation

## 2012-03-06 DIAGNOSIS — R110 Nausea: Secondary | ICD-10-CM | POA: Insufficient documentation

## 2012-03-06 DIAGNOSIS — Z79899 Other long term (current) drug therapy: Secondary | ICD-10-CM | POA: Insufficient documentation

## 2012-03-06 DIAGNOSIS — M1990 Unspecified osteoarthritis, unspecified site: Secondary | ICD-10-CM | POA: Insufficient documentation

## 2012-03-06 DIAGNOSIS — E119 Type 2 diabetes mellitus without complications: Secondary | ICD-10-CM | POA: Insufficient documentation

## 2012-03-06 DIAGNOSIS — Z87891 Personal history of nicotine dependence: Secondary | ICD-10-CM | POA: Insufficient documentation

## 2012-03-06 DIAGNOSIS — J449 Chronic obstructive pulmonary disease, unspecified: Secondary | ICD-10-CM | POA: Insufficient documentation

## 2012-03-06 DIAGNOSIS — E785 Hyperlipidemia, unspecified: Secondary | ICD-10-CM | POA: Insufficient documentation

## 2012-03-06 DIAGNOSIS — G4730 Sleep apnea, unspecified: Secondary | ICD-10-CM | POA: Insufficient documentation

## 2012-03-06 DIAGNOSIS — I2510 Atherosclerotic heart disease of native coronary artery without angina pectoris: Secondary | ICD-10-CM | POA: Insufficient documentation

## 2012-03-06 DIAGNOSIS — I509 Heart failure, unspecified: Secondary | ICD-10-CM | POA: Insufficient documentation

## 2012-03-06 DIAGNOSIS — I252 Old myocardial infarction: Secondary | ICD-10-CM | POA: Insufficient documentation

## 2012-03-06 DIAGNOSIS — D689 Coagulation defect, unspecified: Secondary | ICD-10-CM | POA: Insufficient documentation

## 2012-03-06 DIAGNOSIS — I4891 Unspecified atrial fibrillation: Secondary | ICD-10-CM | POA: Insufficient documentation

## 2012-03-06 DIAGNOSIS — Z862 Personal history of diseases of the blood and blood-forming organs and certain disorders involving the immune mechanism: Secondary | ICD-10-CM | POA: Insufficient documentation

## 2012-03-06 DIAGNOSIS — J4489 Other specified chronic obstructive pulmonary disease: Secondary | ICD-10-CM | POA: Insufficient documentation

## 2012-03-06 DIAGNOSIS — Z8639 Personal history of other endocrine, nutritional and metabolic disease: Secondary | ICD-10-CM | POA: Insufficient documentation

## 2012-03-06 LAB — CBC WITH DIFFERENTIAL/PLATELET
Hemoglobin: 14.9 g/dL (ref 13.0–17.0)
Lymphocytes Relative: 31 % (ref 12–46)
Lymphs Abs: 2.4 10*3/uL (ref 0.7–4.0)
Monocytes Relative: 9 % (ref 3–12)
Neutro Abs: 4.5 10*3/uL (ref 1.7–7.7)
Neutrophils Relative %: 59 % (ref 43–77)
Platelets: 168 10*3/uL (ref 150–400)
RBC: 4.59 MIL/uL (ref 4.22–5.81)
WBC: 7.7 10*3/uL (ref 4.0–10.5)

## 2012-03-06 LAB — BASIC METABOLIC PANEL
BUN: 10 mg/dL (ref 6–23)
CO2: 30 mEq/L (ref 19–32)
Calcium: 9.9 mg/dL (ref 8.4–10.5)
Chloride: 98 mEq/L (ref 96–112)
Creatinine, Ser: 0.92 mg/dL (ref 0.50–1.35)

## 2012-03-06 MED ORDER — ONDANSETRON HCL 4 MG PO TABS: 4.0000 mg | ORAL_TABLET | Freq: Four times a day (QID) | ORAL | Status: DC | PRN

## 2012-03-06 NOTE — ED Provider Notes (Signed)
History     CSN: [id number]  Arrival date & time 03/06/12  1409   First MD Initiated Contact with Patient 03/06/12 1444      Chief Complaint  Patient presents with  . Fall    (Consider location/radiation/quality/duration/timing/severity/associated sxs/prior treatment) Patient is a 77 y.o. male presenting with fall. The history is provided by the patient.  Fall  He had fallen 8 days ago and was seen in emergency department and found the broken ribs and bleeding around the brain in transfer to [facility]. He was discharged to [facility] 7 days ago. The day after he went home, he got dizzy on one occasion when he stood up. He also got dizzy on standing up 2 days ago. There were no other episodes of dizziness. He continues to have intermittent headaches which have been as severe as 10/10 and are currently rated at 7/10. He has nausea when he takes his pain medication, but the pain medication doesn't good job of relieving pain. He denies vomiting and he has not fallen. His headaches got worse after a near fall 2 days ago. He is very upset because after one doctor had discharged him from the hospital, another doctor told him that he shouldn't have been discharged. He also says that his insurance company told him he shouldn't have been discharged. He is also continuing to have pain in the left rib cage where he had fractured his ribs.  Past Medical History  Diagnosis Date  . Arteriosclerotic cardiovascular disease (ASCVD)   . Diabetes mellitus   . Atrial fibrillation     Onset in 2012  . COPD (chronic obstructive pulmonary disease)   . Gout   . DJD (degenerative joint disease)   . Hyperlipidemia   . Obstructive sleep apnea   . Chronic anticoagulation 2012    2012  . Myocardial infarction   . CHF (congestive heart failure)     Past Surgical History  Procedure Date  . Cholecystectomy   . Knee surgery   . Vasectomy   . Cystoscopy/retrograde/ureteroscopy 06/28/2011    Procedure: CYSTOSCOPY/RETROGRADE/URETEROSCOPY;  Surgeon: Ky Barban, MD;  Location: [facility];  Service: Urology;  Laterality: Right;  . Stone extraction with basket 06/28/2011    Procedure: STONE EXTRACTION WITH BASKET;  Surgeon: Ky Barban, MD;  Location: [facility];  Service: Urology;  Laterality: Right;  specimen given to family per MD    Family History  Problem Relation Age of Onset  . Heart failure Mother   . Heart failure Father     History  Substance Use Topics  . Smoking status: Former Smoker -- 1.0 packs/day for 40 years    Types: Cigarettes    Quit date: 04/08/1992  . Smokeless tobacco: Never Used  . Alcohol Use: No      Review of Systems  Cardiovascular: Positive for leg swelling.  All other systems reviewed and are negative.    Allergies  Procaine hcl and Tramadol  Home Medications   Current Outpatient Rx  Name  Route  Sig  Dispense  Refill  . CARVEDILOL 12.5 MG PO TABS   Oral   Take 1 tablet (12.5 mg total) by mouth 2 (two) times daily with a meal.   60 tablet   5   . DIGOXIN 0.25 MG PO TABS   Oral   Take 1 tablet (0.25 mg total) by mouth daily.   30 tablet   3   . DILTIAZEM HCL ER COATED BEADS 180  MG PO CP24   Oral   Take 1 capsule (180 mg total) by mouth daily.   30 capsule   5   . MAGNESIUM OXIDE 400 MG PO TABS   Oral   Take 400 mg by mouth 2 (two) times daily.           Marland Kitchen METFORMIN HCL 500 MG PO TABS   Oral   Take 500 mg by mouth 2 (two) times daily with a meal.           . FISH OIL BURP-LESS 1000 MG PO CAPS   Oral   Take 1,000 mg by mouth daily.           Marland Kitchen PRAVASTATIN SODIUM 40 MG PO TABS   Oral   Take 1 tablet (40 mg total) by mouth at bedtime.         Marland Kitchen VITAMIN E 400 UNITS PO CAPS   Oral   Take 400 Units by mouth daily.           Lilian Kapur 3.75 G PO PACK   Oral   Take 3.75 g by mouth daily.            BP 120/77  Pulse 62  Temp 97.5 F (36.4 C) (Oral)  Resp 19  Ht 5\' 4"  (1.626 m)  Wt 197 lb  (89.359 kg)  BMI 33.82 kg/m2  SpO2 97%  Physical Exam  Nursing note and vitals reviewed. 77 year old male, resting comfortably and in no acute distress. Vital signs are normal. Oxygen saturation is 97%, which is normal. Head is normocephalic and atraumatic. PERRLA, EOMI. Oropharynx is clear. Fundi show no hemorrhage, exudate, or papilledema. Neck is nontender and supple without adenopathy or JVD. Back is nontender and there is no CVA tenderness. Lungs are clear without rales, wheezes, or rhonchi. Chest is moderately tender on the left side consistent with recent rib fractures. Heart has regular rate and rhythm without murmur. Abdomen is soft, flat, nontender without masses or hepatosplenomegaly and peristalsis is normoactive. Extremities have  2+ pitting edema, no cyanosis. There is full passive range of motion.. Skin is warm and dry without rash. Neurologic: Mental status is normal, cranial nerves are intact, there are no motor or sensory deficits.   ED Course  Procedures (including critical care time)  Results for orders placed during the hospital encounter of 03/06/12  CBC WITH DIFFERENTIAL      Component Value Range   WBC    4.0 - 10.5 K/uL   Value: PATIENT IDENTIFICATION ERROR. PLEASE DISREGARD RESULTS. ACCOUNT WILL BE CREDITED.   RBC 4.93  4.22 - 5.81 MIL/uL   Hemoglobin 14.6  13.0 - 17.0 g/dL   HCT 40.3  47.4 - 25.9 %   MCV 83.0  78.0 - 100.0 fL   MCH 29.6  26.0 - 34.0 pg   MCHC 35.7  30.0 - 36.0 g/dL   RDW 56.3  87.5 - 64.3 %   Platelets 261  150 - 400 K/uL   Neutrophils Relative 72  43 - 77 %   Neutro Abs 5.2  1.7 - 7.7 K/uL   Lymphocytes Relative 19  12 - 46 %   Lymphs Abs 1.4  0.7 - 4.0 K/uL   Monocytes Relative 8  3 - 12 %   Monocytes Absolute 0.6  0.1 - 1.0 K/uL   Eosinophils Relative 1  0 - 5 %   Eosinophils Absolute 0.1  0.0 - 0.7 K/uL   Basophils Relative 0  0 - 1 %   Basophils Absolute 0.0  0.0 - 0.1 K/uL   Ct Head Wo Contrast  03/06/2012  *RADIOLOGY  REPORT*  Clinical Data: Pain, altered mental status  CT HEAD WITHOUT CONTRAST  Technique:  Contiguous axial images were obtained from the base of the skull through the vertex without contrast.  Comparison: 02/28/2012  Findings: Prior focal subarachnoid hemorrhage overlying the right frontal vertex has resolved.  No evidence of parenchymal hemorrhage or extra-axial fluid collection. No mass lesion, mass effect, or midline shift.  No CT evidence of acute infarction.  Mild global cortical atrophy with overlying extra-axial prominence.  The visualized paranasal sinuses are essentially clear.  Minimal partial opacification of the right mastoid air cells, unchanged.  No evidence of calvarial fracture.  IMPRESSION: Prior focal subarachnoid hemorrhage overlying the right frontal vertex has resolved.  No evidence of acute intracranial abnormality.   Original Report Authenticated By: Charline Bills, M.D.     Images viewed by me.   Date: 03/06/2012  Rate: 55  Rhythm: atrial fibrillation  QRS Axis: normal  Intervals: normal  ST/T Wave abnormalities: nonspecific ST/T changes  Conduction Disutrbances:nonspecific intraventricular conduction delay  Narrative Interpretation: Atrial fibrillation, nonspecific intraventricular conduction delay, nonspecific ST and T changes. When compared with ECG of 02/02/2012, no significant changes are seen. Please note that there are 2 ECGs that are labeled as belonging to this patient on  02/02/2012, and one of them is significantly different from all the other patient's ECGs and is probably mislabeled and belongs to another patient.   Old EKG Reviewed: unchanged    1. Post concussion syndrome   2. Nausea       MDM  Ongoing headache status post traumatic subarachnoid hemorrhage. I'm not sure what the cause of his 2 episodes of dizziness where. Old records are reviewed, and he was admitted with a traumatic subarachnoid hemorrhage and traumatic rib fractures 8 days ago and  discharged 7 days ago after he seemed to be doing much better. CT scan will be repeated beer. Because of his episodes of dizziness, orthostatic vital signs will be obtained and CBC and basic metabolic panel will be checked.  CT shows complete resolution of subarachnoid bleeding. Orthostatic vital signs are unremarkable. CBC is normal. Electrolytes are pending at this point and she will be discharged if no significant findings. He is requesting medication for nausea so prescription is written for ondansetron and he is to followup with his PCP and with his neurosurgeon.     Dione Booze, MD 03/06/12 [phone]

## 2012-03-06 NOTE — ED Notes (Signed)
Pt with c/o HA since Wednesday, continued left rib pain since fall, recent admission at [facility] due "bleeding on the brain"

## 2012-03-06 NOTE — ED Notes (Signed)
Pt fell x 1 week ago and d/c yesterday due to bleeding in brain. Pt daughter states a doctor at [facility] told her to bring him to ed today for a repeat ct head to check the bleeding. Pt alert/oriented. Denies any changes since last fall. Was taken off coumadin last Thursday. Has been dizzy since fall but better. C/o intermittant headache, now "slightly". Broken ribs from fall also.

## 2012-03-06 NOTE — ED Provider Notes (Signed)
I was asked by Dr Preston Fleeting to evaluate the electrolytes prior to D/C. The glucose is mildly elevated, near baseline values. No intervention required.  [name] Melter, MD 03/06/12 [phone]

## 2012-03-07 LAB — CBC WITH DIFFERENTIAL/PLATELET

## 2012-03-24 ENCOUNTER — Encounter (HOSPITAL_COMMUNITY): Payer: Self-pay

## 2012-03-24 ENCOUNTER — Emergency Department (HOSPITAL_COMMUNITY): Payer: Medicare Other

## 2012-03-24 ENCOUNTER — Observation Stay (HOSPITAL_COMMUNITY)
Admission: EM | Admit: 2012-03-24 | Discharge: 2012-03-26 | Disposition: A | Discharge status: Home Health | Payer: Medicare Other | Attending: Family Medicine | Admitting: Family Medicine

## 2012-03-24 ENCOUNTER — Telehealth: Payer: Self-pay | Admitting: *Deleted

## 2012-03-24 ENCOUNTER — Telehealth: Payer: Self-pay | Admitting: Adult Health

## 2012-03-24 DIAGNOSIS — I4891 Unspecified atrial fibrillation: Secondary | ICD-10-CM | POA: Insufficient documentation

## 2012-03-24 DIAGNOSIS — I509 Heart failure, unspecified: Secondary | ICD-10-CM | POA: Insufficient documentation

## 2012-03-24 DIAGNOSIS — J441 Chronic obstructive pulmonary disease with (acute) exacerbation: Principal | ICD-10-CM | POA: Insufficient documentation

## 2012-03-24 DIAGNOSIS — E119 Type 2 diabetes mellitus without complications: Secondary | ICD-10-CM | POA: Insufficient documentation

## 2012-03-24 DIAGNOSIS — R0602 Shortness of breath: Secondary | ICD-10-CM | POA: Insufficient documentation

## 2012-03-24 DIAGNOSIS — I2589 Other forms of chronic ischemic heart disease: Secondary | ICD-10-CM | POA: Insufficient documentation

## 2012-03-24 LAB — PROTIME-INR: INR: 1.16 (ref 0.00–1.49)

## 2012-03-24 LAB — BASIC METABOLIC PANEL
BUN: 12 mg/dL (ref 6–23)
CO2: 30 mEq/L (ref 19–32)
GFR calc non Af Amer: 64 mL/min — ABNORMAL LOW (ref >90)
Glucose, Bld: 184 mg/dL — ABNORMAL HIGH (ref 70–99)
Potassium: 4.1 mEq/L (ref 3.5–5.1)
Sodium: 139 mEq/L (ref 135–145)

## 2012-03-24 LAB — CBC WITH DIFFERENTIAL/PLATELET
Eosinophils Absolute: 0.1 10*3/uL (ref 0.0–0.7)
Eosinophils Relative: 1 % (ref 0–5)
Hemoglobin: 14.9 g/dL (ref 13.0–17.0)
Lymphocytes Relative: 23 % (ref 12–46)
Lymphs Abs: 1.8 10*3/uL (ref 0.7–4.0)
MCH: 32 pg (ref 26.0–34.0)
MCV: 97.8 fL (ref 78.0–100.0)
Monocytes Relative: 9 % (ref 3–12)
Platelets: 154 10*3/uL (ref 150–400)
RBC: 4.65 MIL/uL (ref 4.22–5.81)
WBC: 7.8 10*3/uL (ref 4.0–10.5)

## 2012-03-24 LAB — MAGNESIUM: Magnesium: 1.5 mg/dL (ref 1.5–2.5)

## 2012-03-24 LAB — GLUCOSE, CAPILLARY: Glucose-Capillary: 121 mg/dL — ABNORMAL HIGH (ref 70–99)

## 2012-03-24 MED ORDER — ALBUTEROL SULFATE (5 MG/ML) 0.5% IN NEBU
2.5000 mg | INHALATION_SOLUTION | Freq: Three times a day (TID) | RESPIRATORY_TRACT | Status: DC
  Administered 2012-03-25 – 2012-03-26 (×5): 2.5 mg via RESPIRATORY_TRACT
  Filled 2012-03-24 (×6): qty 0.5

## 2012-03-24 MED ORDER — ASPIRIN 81 MG PO CHEW
81.0000 mg | CHEWABLE_TABLET | Freq: Every day | ORAL | Status: DC
  Administered 2012-03-24 – 2012-03-26 (×3): 81 mg via ORAL
  Filled 2012-03-24 (×4): qty 1

## 2012-03-24 MED ORDER — METFORMIN HCL 500 MG PO TABS
500.0000 mg | ORAL_TABLET | Freq: Two times a day (BID) | ORAL | Status: DC
  Administered 2012-03-24 – 2012-03-26 (×5): 500 mg via ORAL
  Filled 2012-03-24 (×5): qty 1

## 2012-03-24 MED ORDER — METOPROLOL TARTRATE 25 MG PO TABS
25.0000 mg | ORAL_TABLET | Freq: Two times a day (BID) | ORAL | Status: DC
  Administered 2012-03-24 – 2012-03-26 (×5): 25 mg via ORAL
  Filled 2012-03-24 (×5): qty 1

## 2012-03-24 MED ORDER — IPRATROPIUM BROMIDE 0.02 % IN SOLN
0.5000 mg | RESPIRATORY_TRACT | Status: DC
  Administered 2012-03-24: 0.5 mg via RESPIRATORY_TRACT
  Filled 2012-03-24 (×2): qty 2.5

## 2012-03-24 MED ORDER — ENOXAPARIN SODIUM 100 MG/ML ~~LOC~~ SOLN
90.0000 mg | Freq: Two times a day (BID) | SUBCUTANEOUS | Status: DC
  Administered 2012-03-24 – 2012-03-26 (×4): 90 mg via SUBCUTANEOUS
  Filled 2012-03-24 (×4): qty 1

## 2012-03-24 MED ORDER — CARVEDILOL 12.5 MG PO TABS
12.5000 mg | ORAL_TABLET | Freq: Two times a day (BID) | ORAL | Status: DC
  Administered 2012-03-24 – 2012-03-26 (×4): 12.5 mg via ORAL
  Filled 2012-03-24 (×4): qty 1

## 2012-03-24 MED ORDER — IPRATROPIUM BROMIDE 0.02 % IN SOLN
0.5000 mg | Freq: Once | RESPIRATORY_TRACT | Status: AC
  Administered 2012-03-24: 0.5 mg via RESPIRATORY_TRACT
  Filled 2012-03-24: qty 2.5

## 2012-03-24 MED ORDER — WARFARIN - PHARMACIST DOSING INPATIENT
Freq: Every day | Status: DC
  Administered 2012-03-25: 18:00:00

## 2012-03-24 MED ORDER — DIGOXIN 125 MCG PO TABS
0.1250 mg | ORAL_TABLET | Freq: Every day | ORAL | Status: DC
  Administered 2012-03-24 – 2012-03-26 (×3): 0.125 mg via ORAL
  Filled 2012-03-24 (×3): qty 1

## 2012-03-24 MED ORDER — IPRATROPIUM-ALBUTEROL 0.5-2.5 (3) MG/3ML IN SOLN: 3.0000 mL | RESPIRATORY_TRACT | Status: DC

## 2012-03-24 MED ORDER — ALBUTEROL SULFATE (5 MG/ML) 0.5% IN NEBU
2.5000 mg | INHALATION_SOLUTION | RESPIRATORY_TRACT | Status: DC
  Administered 2012-03-24: 2.5 mg via RESPIRATORY_TRACT
  Filled 2012-03-24 (×2): qty 0.5

## 2012-03-24 MED ORDER — TAMSULOSIN HCL 0.4 MG PO CAPS
0.4000 mg | ORAL_CAPSULE | Freq: Every day | ORAL | Status: DC
  Administered 2012-03-24 – 2012-03-26 (×3): 0.4 mg via ORAL
  Filled 2012-03-24 (×3): qty 1

## 2012-03-24 MED ORDER — WARFARIN SODIUM 7.5 MG PO TABS
7.5000 mg | ORAL_TABLET | Freq: Once | ORAL | Status: AC
  Administered 2012-03-24: 7.5 mg via ORAL
  Filled 2012-03-24: qty 1

## 2012-03-24 MED ORDER — OXYCODONE-ACETAMINOPHEN 5-325 MG PO TABS: 1.0000 | ORAL_TABLET | ORAL | Status: DC | PRN

## 2012-03-24 MED ORDER — ALBUTEROL SULFATE (5 MG/ML) 0.5% IN NEBU
5.0000 mg | INHALATION_SOLUTION | Freq: Once | RESPIRATORY_TRACT | Status: AC
  Administered 2012-03-24: 5 mg via RESPIRATORY_TRACT
  Filled 2012-03-24: qty 1

## 2012-03-24 MED ORDER — IPRATROPIUM BROMIDE 0.02 % IN SOLN
0.5000 mg | Freq: Three times a day (TID) | RESPIRATORY_TRACT | Status: DC
  Administered 2012-03-25 – 2012-03-26 (×6): 0.5 mg via RESPIRATORY_TRACT
  Filled 2012-03-24 (×5): qty 2.5

## 2012-03-24 NOTE — ED Provider Notes (Signed)
History   This chart was scribed for Donnetta Hutching, MD by Sofie Rower, ED Scribe. The patient was seen in room [facility] and the patient's care was started at 10:02AM.    CSN: [id number]  Arrival date & time 03/24/12  0945   First MD Initiated Contact with Patient 03/24/12 1002      Chief Complaint  Patient presents with  . Shortness of Breath    (Consider location/radiation/quality/duration/timing/severity/associated sxs/prior treatment) The history is provided by the patient and the spouse. No language interpreter was used.    [name] is a 77 y.o. male , with a hx of brain bleed, atrial fibrillation, COPD, and rib fracture (02/27/12), who presents to the Emergency Department complaining of sudden, progressively worsening, shortness of breath, onset one week ago. The pt reports he was evaluated by Dr. Janna Arch yesterday, 03/23/12, where he was placed on Coumadin. Furthermore, the informs he is experiencing a constant, moderate rib pain, located at the left rib cage, due to falling, while trying to relieve is bowels on 02/27/12. The pt reports he cannot walk a long distance before he becomes tired and short of breath. Modifying factors include certain movements and positions which intensify the rib pain.  The pt does not smoke or drink alcohol.   PCP is Dr. Janna Arch.    Past Medical History  Diagnosis Date  . Arteriosclerotic cardiovascular disease (ASCVD)   . Diabetes mellitus   . Atrial fibrillation     Onset in 2012  . COPD (chronic obstructive pulmonary disease)   . Gout   . DJD (degenerative joint disease)   . Hyperlipidemia   . Obstructive sleep apnea   . Chronic anticoagulation 2012    2012  . Myocardial infarction   . CHF (congestive heart failure)     Past Surgical History  Procedure Date  . Cholecystectomy   . Knee surgery   . Vasectomy   . Cystoscopy/retrograde/ureteroscopy 06/28/2011    Procedure: CYSTOSCOPY/RETROGRADE/URETEROSCOPY;  Surgeon: Ky Barban, MD;  Location: [facility];  Service: Urology;  Laterality: Right;  . Stone extraction with basket 06/28/2011    Procedure: STONE EXTRACTION WITH BASKET;  Surgeon: Ky Barban, MD;  Location: [facility];  Service: Urology;  Laterality: Right;  specimen given to family per MD    Family History  Problem Relation Age of Onset  . Heart failure Mother   . Heart failure Father     History  Substance Use Topics  . Smoking status: Former Smoker -- 1.0 packs/day for 40 years    Types: Cigarettes    Quit date: 04/08/1992  . Smokeless tobacco: Never Used  . Alcohol Use: No      Review of Systems  Respiratory: Positive for shortness of breath.   All other systems reviewed and are negative.    Allergies  Procaine hcl and Tramadol  Home Medications   Current Outpatient Rx  Name  Route  Sig  Dispense  Refill  . CARVEDILOL 12.5 MG PO TABS   Oral   Take 1 tablet (12.5 mg total) by mouth 2 (two) times daily with a meal.   60 tablet   5   . COLESEVELAM HCL 625 MG PO TABS   Oral   Take 1,875 mg by mouth as directed. Takes 4 tablets day 1, then 2 tablets for the next two days. Then repeat.         Marland Kitchen DIGOXIN 0.25 MG PO TABS   Oral   Take 1 tablet (  0.25 mg total) by mouth daily.   30 tablet   3   . DOCUSATE SODIUM 100 MG PO CAPS   Oral   Take 100 mg by mouth 2 (two) times daily.         Marland Kitchen MAGNESIUM OXIDE 400 MG PO TABS   Oral   Take 400 mg by mouth 2 (two) times daily.           Marland Kitchen METFORMIN HCL 500 MG PO TABS   Oral   Take 500 mg by mouth 2 (two) times daily with a meal.           . METOPROLOL TARTRATE 25 MG PO TABS   Oral   Take 25 mg by mouth 2 (two) times daily.         Marland Kitchen FISH OIL BURP-LESS 1000 MG PO CAPS   Oral   Take 1,000 mg by mouth daily.           . OXYCODONE HCL 5 MG PO TABS   Oral   Take 5 mg by mouth 4 (four) times daily.         Marland Kitchen PRAVASTATIN SODIUM 40 MG PO TABS   Oral   Take 40 mg by mouth daily.         Marland Kitchen TAMSULOSIN HCL 0.4  MG PO CAPS   Oral   Take 0.4 mg by mouth daily after breakfast.         . WARFARIN SODIUM 4 MG PO TABS   Oral   Take 4 mg by mouth daily.           BP 126/100  Pulse 100  Temp 98 F (36.7 C) (Oral)  Ht 5\' 5"  (1.651 m)  Wt 203 lb (92.08 kg)  BMI 33.78 kg/m2  SpO2 95%  Physical Exam  Nursing note and vitals reviewed. Constitutional: He is oriented to person, place, and time. He appears well-developed and well-nourished.       Obese.   HENT:  Head: Normocephalic and atraumatic.  Eyes: Conjunctivae normal and EOM are normal. Pupils are equal, round, and reactive to light.  Neck: Normal range of motion. Neck supple.  Cardiovascular: Normal rate and normal heart sounds.  An irregularly irregular rhythm present.  Pulmonary/Chest: Effort normal and breath sounds normal.  Abdominal: Soft. Bowel sounds are normal.       Protuberant.   Musculoskeletal: Normal range of motion.  Neurological: He is alert and oriented to person, place, and time.  Skin: Skin is warm and dry.  Psychiatric: He has a normal mood and affect.    ED Course  Procedures (including critical care time)  DIAGNOSTIC STUDIES: Oxygen Saturation is 95% on room air, normal by my interpretation.    COORDINATION OF CARE:  10:23 AM- Treatment plan discussed with patient. Pt agrees with treatment.      Results for orders placed during the hospital encounter of 03/24/12  CBC WITH DIFFERENTIAL      Component Value Range   WBC 7.8  4.0 - 10.5 K/uL   RBC 4.65  4.22 - 5.81 MIL/uL   Hemoglobin 14.9  13.0 - 17.0 g/dL   HCT 25.9  56.3 - 87.5 %   MCV 97.8  78.0 - 100.0 fL   MCH 32.0  26.0 - 34.0 pg   MCHC 32.7  30.0 - 36.0 g/dL   RDW 64.3  32.9 - 51.8 %   Platelets 154  150 - 400 K/uL   Neutrophils Relative 67  43 - 77 %   Neutro Abs 5.2  1.7 - 7.7 K/uL   Lymphocytes Relative 23  12 - 46 %   Lymphs Abs 1.8  0.7 - 4.0 K/uL   Monocytes Relative 9  3 - 12 %   Monocytes Absolute 0.7  0.1 - 1.0 K/uL    Eosinophils Relative 1  0 - 5 %   Eosinophils Absolute 0.1  0.0 - 0.7 K/uL   Basophils Relative 0  0 - 1 %   Basophils Absolute 0.0  0.0 - 0.1 K/uL  BASIC METABOLIC PANEL      Component Value Range   Sodium 139  135 - 145 mEq/L   Potassium 4.1  3.5 - 5.1 mEq/L   Chloride 100  96 - 112 mEq/L   CO2 30  19 - 32 mEq/L   Glucose, Bld 184 (*) 70 - 99 mg/dL   BUN 12  6 - 23 mg/dL   Creatinine, Ser 1.61  0.50 - 1.35 mg/dL   Calcium 9.3  8.4 - 09.6 mg/dL   GFR calc non Af Amer 64 (*) >90 mL/min   GFR calc Af Amer 74 (*) >90 mL/min  TROPONIN I      Component Value Range   Troponin I <0.30  <0.30 ng/mL      Dg Chest Portable 1 View  03/24/2012  *RADIOLOGY REPORT*  Clinical Data: Shortness of breath  PORTABLE CHEST - 1 VIEW  Comparison: 02/27/2012  Findings: Cardiomegaly again noted.  No acute infiltrate or pulmonary edema.  Question small right pleural effusion with blunting of the right costophrenic angle.  IMPRESSION: Cardiomegaly again noted.  No acute infiltrate or pulmonary edema. Question small right pleural effusion.   Original Report Authenticated By: Natasha Mead, M.D.       No diagnosis found.   Date: 03/24/2012  Rate: 111  Rhythm: atrial fibrillation  QRS Axis: normal  Intervals: normal  ST/T Wave abnormalities: normal  Conduction Disutrbances:nonspecific intraventricular conduction delay  Narrative Interpretation:   Old EKG Reviewed: changes noted   MDM  Patient presents with worsening dyspnea.  Diagnostic workup shows no acute problem.  Family is uncomfortable taking him home.  We'll admit.  discussed with internist      I personally performed the services described in this documentation, which was scribed in my presence. The recorded information has been reviewed and is accurate.    Donnetta Hutching, MD 03/24/12 [phone]

## 2012-03-24 NOTE — Progress Notes (Signed)
ANTICOAGULATION CONSULT NOTE - Initial Consult  Pharmacy Consult for Lovenox and Warfarin Indication: atrial fibrillation  Allergies  Allergen Reactions  . Procaine Hcl Nausea And Vomiting  . Tramadol Nausea And Vomiting    Patient Measurements: Height: 5\' 5"  (165.1 cm) Weight: 203 lb (92.08 kg) IBW/kg (Calculated) : 61.5   Vital Signs: Temp: 98 F (36.7 C) (12/17 0954) Temp src: Oral (12/17 0954) BP: 127/89 mmHg (12/17 1630) Pulse Rate: 88  (12/17 1630)  Labs:  Basename 03/24/12 1015  HGB 14.9  HCT 45.5  PLT 154  APTT --  LABPROT 14.6  INR 1.16  HEPARINUNFRC --  CREATININE 1.10  CKTOTAL --  CKMB --  TROPONINI <0.30    Estimated Creatinine Clearance: 60.5 ml/min (by C-G formula based on Cr of 1.1).   Medical History: Past Medical History  Diagnosis Date  . Arteriosclerotic cardiovascular disease (ASCVD)   . Diabetes mellitus   . Atrial fibrillation     Onset in 2012  . COPD (chronic obstructive pulmonary disease)   . Gout   . DJD (degenerative joint disease)   . Hyperlipidemia   . Obstructive sleep apnea   . Chronic anticoagulation 2012    2012  . Myocardial infarction   . CHF (congestive heart failure)     Medications:  Prescriptions prior to admission  Medication Sig Dispense Refill  . carvedilol (COREG) 12.5 MG tablet Take 1 tablet (12.5 mg total) by mouth 2 (two) times daily with a meal.  60 tablet  5  . colesevelam (WELCHOL) 625 MG tablet Take 1,875 mg by mouth as directed. Takes 4 tablets day 1, then 2 tablets for the next two days. Then repeat.      . digoxin (LANOXIN) 0.25 MG tablet Take 1 tablet (0.25 mg total) by mouth daily.  30 tablet  3  . docusate sodium (COLACE) 100 MG capsule Take 100 mg by mouth 2 (two) times daily.      . magnesium oxide (MAG-OX) 400 MG tablet Take 400 mg by mouth 2 (two) times daily.        . metFORMIN (GLUCOPHAGE) 500 MG tablet Take 500 mg by mouth 2 (two) times daily with a meal.        . metoprolol tartrate  (LOPRESSOR) 25 MG tablet Take 25 mg by mouth 2 (two) times daily.      . Omega-3 Fatty Acids (FISH OIL BURP-LESS) 1000 MG CAPS Take 1,000 mg by mouth daily.        Marland Kitchen oxyCODONE (OXY IR/ROXICODONE) 5 MG immediate release tablet Take 5 mg by mouth 4 (four) times daily.      . pravastatin (PRAVACHOL) 40 MG tablet Take 40 mg by mouth daily.      . Tamsulosin HCl (FLOMAX) 0.4 MG CAPS Take 0.4 mg by mouth daily after breakfast.      . warfarin (COUMADIN) 4 MG tablet Take 4 mg by mouth daily.        Assessment: 77yo male on chronic warfarin for AFib.  INR is at baseline on admission.  Cover with full dose Lovenox until INR is therapeutic per MD. Goal of Therapy:  INR 2-3 Monitor platelets by anticoagulation protocol: Yes   Plan: Lovenox 1mg /Kg sq q12hrs until INR therapeutic Coumadin 7.5mg  today x 1  INR daily CBC per protocol  [name] A 03/24/2012,4:59 PM

## 2012-03-24 NOTE — ED Notes (Signed)
Socks given

## 2012-03-24 NOTE — Telephone Encounter (Signed)
Noted pt was treated with nebulizer treatment at 1400, echocardiogram ordered, also noted pt PCP has been changed to pt care per pt is now listed as admitted to [facility], thus pt concerns are being addressed properly

## 2012-03-24 NOTE — Telephone Encounter (Signed)
Received incoming call from pt room in ED and wondered why the ED has done nothing for him, apologized to pt for the frustration however while reviewing the pt current ED notations, the pt has been placed on telemetry monitoring, EKG and Chest X-ray have been performed as well as a IV port has been inserted in case of need for usage in future, pt stated that if someone does not do anything he is going to leave, I advised this is his right as a pt to leave however if he goes to another facility he will more than likely have to wait all over again and further testing or duplicate testing maybe performed thus causing a further delay in his diagnosis, I advised that he is being monitored by the machines as we are talking and the ED MD is taking all of the testing into account before his final DX, pt will need to push the call light to discuss further with the ED nurse, pt then stated "I only see brown curtains in this place, that is all I am seeing right now" this nurse then advised pt it is his right to leave however he will need to sign a form that is called AMA form that states he is leaving against medical advise, pt understood and stated he will wait it out to see what the MD says.

## 2012-03-24 NOTE — ED Notes (Signed)
Complain of being SOB since falling in October

## 2012-03-24 NOTE — Telephone Encounter (Signed)
Patient was seen by PCP yesterday but patient's daughter is still concerned about swelling in his feet and legs. / tg

## 2012-03-24 NOTE — ED Notes (Signed)
Family requesting Dr. Adriana Simas to come in and see pt. Dr. Adriana Simas advised. Family given information as directed.

## 2012-03-24 NOTE — ED Notes (Signed)
Meds admin that were available in ED. Awaiting bed assignment.

## 2012-03-24 NOTE — H&P (Signed)
[id number]  

## 2012-03-24 NOTE — Telephone Encounter (Signed)
Noted pt is currently being treated in [facility] ED to address current s/s

## 2012-03-24 NOTE — Telephone Encounter (Signed)
Called pt and daughter to clarify pt current s/s, pt noted at PCP office yesterday his HR was 110 and he fell in November where he cracked his ribs and he has not felt good every since, however he is now having problems breathing, everytime he takes 3-4 steps he feels that he ran a marathon as well as chest pains daily for a week now, his PCP advised yesterday to come back in a week, also noted his swelling in his feet and legs is worse than yesterday, advised pt he needs to be evaluated in ED, pt declined to to call 911, this nurse offered to call 911 for him and pt again declined, placed pt on hold and called pt daughter to advise, she agreed with ED evaluation however notes he car was lent out to a friend for the next 30 minutes, strongly urged daughter and pt to get to ED asap, pt agreed and pt daughter will take to ED when she gets her car back also stressed importance to pt if his s/s worsen before she can get there to please call 911, pt understood and stated he will call 911 then, however before completion of this note, pt daughter called office back to say her dad called her and is now on his way to [facility] ED, he could not wait for her and did not want to call 911 for EMS, pt was advised by daughter if he feels too bad while driving to the ED he will stop and call 911 from his cell phone, pt concerns will be addressed upon arrival at [facility]
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
## 2012-03-25 ENCOUNTER — Inpatient Hospital Stay (HOSPITAL_COMMUNITY): Payer: Medicare Other

## 2012-03-25 DIAGNOSIS — I059 Rheumatic mitral valve disease, unspecified: Secondary | ICD-10-CM

## 2012-03-25 LAB — BASIC METABOLIC PANEL
BUN: 12 mg/dL (ref 6–23)
CO2: 28 mEq/L (ref 19–32)
Chloride: 101 mEq/L (ref 96–112)
Creatinine, Ser: 0.99 mg/dL (ref 0.50–1.35)
GFR calc Af Amer: >90 mL/min (ref >90)
Potassium: 4 mEq/L (ref 3.5–5.1)

## 2012-03-25 LAB — HEPATIC FUNCTION PANEL
ALT: 13 U/L (ref 0–53)
AST: 19 U/L (ref 0–37)
Alkaline Phosphatase: 70 U/L (ref 39–117)
Indirect Bilirubin: 0.6 mg/dL (ref 0.3–0.9)
Total Protein: 6.2 g/dL (ref 6.0–8.3)

## 2012-03-25 LAB — PRO B NATRIURETIC PEPTIDE: Pro B Natriuretic peptide (BNP): 3742 pg/mL — ABNORMAL HIGH (ref 0–450)

## 2012-03-25 LAB — GLUCOSE, CAPILLARY
Glucose-Capillary: 122 mg/dL — ABNORMAL HIGH (ref 70–99)
Glucose-Capillary: 154 mg/dL — ABNORMAL HIGH (ref 70–99)
Glucose-Capillary: 127 mg/dL — ABNORMAL HIGH (ref 70–99)

## 2012-03-25 LAB — CBC
HCT: 42.3 % (ref 39.0–52.0)
Hemoglobin: 13.9 g/dL (ref 13.0–17.0)
MCV: 98.1 fL (ref 78.0–100.0)
Platelets: 152 10*3/uL (ref 150–400)
RDW: 14.3 % (ref 11.5–15.5)
WBC: 6.8 10*3/uL (ref 4.0–10.5)

## 2012-03-25 LAB — PROTIME-INR: INR: 1.2 (ref 0.00–1.49)

## 2012-03-25 MED ORDER — ALBUTEROL SULFATE (5 MG/ML) 0.5% IN NEBU
2.5000 mg | INHALATION_SOLUTION | RESPIRATORY_TRACT | Status: DC | PRN
  Administered 2012-03-25 (×2): 2.5 mg via RESPIRATORY_TRACT
  Filled 2012-03-25 (×2): qty 0.5

## 2012-03-25 MED ORDER — WARFARIN SODIUM 7.5 MG PO TABS
7.5000 mg | ORAL_TABLET | Freq: Once | ORAL | Status: AC
  Administered 2012-03-25: 7.5 mg via ORAL
  Filled 2012-03-25: qty 1

## 2012-03-25 MED ORDER — SODIUM CHLORIDE 0.9 % IJ SOLN: 3.0000 mL | INTRAMUSCULAR | Status: DC | PRN

## 2012-03-25 MED ORDER — SODIUM CHLORIDE 0.9 % IV SOLN: 250.0000 mL | INTRAVENOUS | Status: DC | PRN

## 2012-03-25 MED ORDER — IPRATROPIUM BROMIDE 0.02 % IN SOLN
0.5000 mg | RESPIRATORY_TRACT | Status: DC | PRN
  Administered 2012-03-25: 0.5 mg via RESPIRATORY_TRACT
  Filled 2012-03-25: qty 2.5

## 2012-03-25 MED ORDER — SODIUM CHLORIDE 0.9 % IJ SOLN
3.0000 mL | Freq: Two times a day (BID) | INTRAMUSCULAR | Status: DC
  Administered 2012-03-25 – 2012-03-26 (×2): 3 mL via INTRAVENOUS

## 2012-03-25 MED ORDER — IOHEXOL 300 MG/ML  SOLN
100.0000 mL | Freq: Once | INTRAMUSCULAR | Status: AC | PRN
  Administered 2012-03-25: 100 mL via INTRAVENOUS

## 2012-03-25 MED ORDER — FUROSEMIDE 20 MG PO TABS
20.0000 mg | ORAL_TABLET | Freq: Every day | ORAL | Status: DC
  Administered 2012-03-25 – 2012-03-26 (×2): 20 mg via ORAL
  Filled 2012-03-25 (×2): qty 1

## 2012-03-25 MED ORDER — SODIUM CHLORIDE 0.9 % IV SOLN
INTRAVENOUS | Status: DC
  Administered 2012-03-25: 10 mL/h via INTRAVENOUS

## 2012-03-25 NOTE — Progress Notes (Signed)
Complained of being unable to get his breath. O2 sats 96% on 2 l [place]. States nebulizer's helped him before. Wasgiven lasix in ED which helped him. Paged Dr Sudie Bailey. Orders received for PRN nebulizer's as well as a BNP and D-dimer.

## 2012-03-25 NOTE — Progress Notes (Signed)
[id number] 

## 2012-03-25 NOTE — Progress Notes (Signed)
UR Chart Review Completed  

## 2012-03-25 NOTE — Progress Notes (Signed)
*  PRELIMINARY RESULTS* Echocardiogram 2D Echocardiogram has been performed.  [name] 03/25/2012, 10:54 AM

## 2012-03-25 NOTE — H&P (Signed)
[name], [name]                  ACCOUNT NO.:  [id number]  MEDICAL RECORD NO.:  [id number]  LOCATION:  A339                          FACILITY:  [facility]  PHYSICIAN:  Melvyn Novas, MDDATE OF BIRTH:  1935-02-05  DATE OF ADMISSION:  03/24/2012 DATE OF DISCHARGE:  LH                             HISTORY & PHYSICAL   The patient is a 77 year old white male with history of chronic atrial fibrillation, status post recent rib fracture after a fall with mild subarachnoid hemorrhage, about a month ago, currently recovering, started on Coumadin again yesterday for his atrial fibrillation.  The patient complains of some increasing dyspnea.  Over the last 2-3 days has some increasing pedal edema, however, he is otherwise hemodynamically stable.  He denies anginal chest pain, orthopnea, PND. He came to the ER and his breathing nebulizer treatments stated it made him feel better.  He will be admitted for dyspnea multifactorial.  He does have 40 pack year history of smoking, chronic atrial fibrillation, and he did not take his medicines today.  There is a question of compliance.  PAST MEDICAL HISTORY:  Significant for atherosclerotic heart disease, diabetes mellitus, chronic atrial fibrillation, COPD, gout, degenerative joint disease, hyperlipidemia, obstructive sleep apnea, chronic anticoagulation, myocardial infarction, congestive heart failure by history.  PAST SURGICAL HISTORY:  Remarkable for cholecystectomy, right knee surgery, arthroscopic vasectomy, and cystoscopy with retrograde, ureteroscopy.  He also had stone extraction with basket in March 2013.  SOCIAL HISTORY:  He smoked 1 pack per day for 40 years.  He quit 15 years ago.  Works as a Technical sales engineer.  CURRENT MEDICINES: 1. Carvedilol 12.5 p.o. b.i.d. 2. Digoxin 0.25 mg p.o. daily. 3. Colace 100 mg p.o. b.i.d. 4. Magnesium oxide 400 mg p.o. b.i.d. 5. Metformin 500 p.o. b.i.d. 6. Metoprolol tartrate 25 p.o. b.i.d. 7. Fish  oil 1000 mg per day b.i.d. 8. Oxycodone 5 mg q.4 h. p.r.n. 9. Pravastatin 40 mg p.o. daily. 10.Flomax 0.4 mg p.o. daily. 11.Warfarin 4 alternating with 2 mg, cycling every 3 days.  This was     just resumed yesterday.  PHYSICAL EXAMINATION:  VITAL SIGNS:  Blood pressure is 128/64, pulse is 90 and irregularly irregular.  He is afebrile.  Respiratory rate is 16. EYES:  PERRLA intact.  Sclerae clear.  Conjunctivae pink. NECK:  No JVD.  No carotid bruits.  No thyromegaly.  No thyroid bruits. LUNGS:  Show no rales, no wheeze or rhonchi appreciable.  Some prolonged expiratory phase. HEART:  Irregularly irregular.  No S3.  No heaves, thrills, or rubs. ABDOMEN:  Soft, nontender.  Bowel sounds normoactive.  No guarding, rebound, mass, or megaly. EXTREMITIES:  2 to 3+ pedal edema which is progressive over 2-3 weeks.  PLAN:  Right now is to resume all current medicines, add nebulizer therapy, get CT angio to rule out pulmonary emboli if D-dimer is elevated and TED hose, anticoagulation with Lovenox and Coumadin at this point for atrial fibrillation and optimize his hemodynamics.     Melvyn Novas, MD     RMD/MEDQ  D:  03/24/2012  T:  03/25/2012  Job:  [id number]

## 2012-03-25 NOTE — Progress Notes (Signed)
ANTICOAGULATION CONSULT NOTE   Pharmacy Consult for Lovenox and Warfarin Indication: atrial fibrillation  Allergies  Allergen Reactions  . Procaine Hcl Nausea And Vomiting  . Tramadol Nausea And Vomiting    Patient Measurements: Height: 5\' 5"  (165.1 cm) Weight: 203 lb (92.08 kg) IBW/kg (Calculated) : 61.5   Vital Signs: Temp: 98.4 F (36.9 C) (12/18 0553) Temp src: Oral (12/18 0553) BP: 115/72 mmHg (12/18 1015) Pulse Rate: 82  (12/18 1015)  Labs:  [facility] 03/25/12 0513 03/24/12 1015  HGB 13.9 14.9  HCT 42.3 45.5  PLT 152 154  APTT -- --  LABPROT 15.0 14.6  INR 1.20 1.16  HEPARINUNFRC -- --  CREATININE 0.99 1.10  CKTOTAL -- --  CKMB -- --  TROPONINI -- <0.30   Estimated Creatinine Clearance: 67.2 ml/min (by C-G formula based on Cr of 0.99).  Medical History: Past Medical History  Diagnosis Date  . Arteriosclerotic cardiovascular disease (ASCVD)   . Diabetes mellitus   . Atrial fibrillation     Onset in 2012  . COPD (chronic obstructive pulmonary disease)   . Gout   . DJD (degenerative joint disease)   . Hyperlipidemia   . Obstructive sleep apnea   . Chronic anticoagulation 2012    2012  . Myocardial infarction   . CHF (congestive heart failure)     Medications:  Prescriptions prior to admission  Medication Sig Dispense Refill  . carvedilol (COREG) 12.5 MG tablet Take 1 tablet (12.5 mg total) by mouth 2 (two) times daily with a meal.  60 tablet  5  . colesevelam (WELCHOL) 625 MG tablet Take 1,875 mg by mouth as directed. Takes 4 tablets day 1, then 2 tablets for the next two days. Then repeat.      . digoxin (LANOXIN) 0.25 MG tablet Take 1 tablet (0.25 mg total) by mouth daily.  30 tablet  3  . docusate sodium (COLACE) 100 MG capsule Take 100 mg by mouth 2 (two) times daily.      . magnesium oxide (MAG-OX) 400 MG tablet Take 400 mg by mouth 2 (two) times daily.        . metFORMIN (GLUCOPHAGE) 500 MG tablet Take 500 mg by mouth 2 (two) times daily with  a meal.        . metoprolol tartrate (LOPRESSOR) 25 MG tablet Take 25 mg by mouth 2 (two) times daily.      . Omega-3 Fatty Acids (FISH OIL BURP-LESS) 1000 MG CAPS Take 1,000 mg by mouth daily.        Marland Kitchen oxyCODONE (OXY IR/ROXICODONE) 5 MG immediate release tablet Take 5 mg by mouth 4 (four) times daily.      . pravastatin (PRAVACHOL) 40 MG tablet Take 40 mg by mouth daily.      . Tamsulosin HCl (FLOMAX) 0.4 MG CAPS Take 0.4 mg by mouth daily after breakfast.      . warfarin (COUMADIN) 4 MG tablet Take 4 mg by mouth daily.        Assessment: 77yo male on chronic warfarin for AFib.  INR is at baseline on admission.  Cover with full dose Lovenox until INR is therapeutic per MD. Goal of Therapy:  INR 2-3 Monitor platelets by anticoagulation protocol: Yes   Plan: Lovenox 1mg /Kg sq q12hrs until INR therapeutic Coumadin 7.5mg  today x 1  INR daily CBC per protocol  [name] A 03/25/2012,11:34 AM

## 2012-03-26 LAB — HEPATIC FUNCTION PANEL
AST: 19 U/L (ref 0–37)
Bilirubin, Direct: 0.2 mg/dL (ref 0.0–0.3)
Indirect Bilirubin: 0.4 mg/dL (ref 0.3–0.9)

## 2012-03-26 LAB — BASIC METABOLIC PANEL
BUN: 14 mg/dL (ref 6–23)
Calcium: 9.8 mg/dL (ref 8.4–10.5)
Creatinine, Ser: 1.05 mg/dL (ref 0.50–1.35)
GFR calc Af Amer: 78 mL/min — ABNORMAL LOW (ref >90)
GFR calc non Af Amer: 67 mL/min — ABNORMAL LOW (ref >90)
Potassium: 3.9 mEq/L (ref 3.5–5.1)

## 2012-03-26 LAB — PROTIME-INR
INR: 1.29 (ref 0.00–1.49)
Prothrombin Time: 15.8 seconds — ABNORMAL HIGH (ref 11.6–15.2)

## 2012-03-26 MED ORDER — IPRATROPIUM BROMIDE 0.02 % IN SOLN: 0.5000 mg | Freq: Four times a day (QID) | RESPIRATORY_TRACT | Status: DC

## 2012-03-26 MED ORDER — ALBUTEROL SULFATE (5 MG/ML) 0.5% IN NEBU: 2.5000 mg | INHALATION_SOLUTION | Freq: Four times a day (QID) | RESPIRATORY_TRACT | Status: DC | PRN

## 2012-03-26 MED ORDER — WARFARIN SODIUM 7.5 MG PO TABS: 7.5000 mg | ORAL_TABLET | Freq: Once | ORAL | Status: DC

## 2012-03-26 MED ORDER — ASPIRIN 81 MG PO CHEW: 81.0000 mg | CHEWABLE_TABLET | Freq: Every day | ORAL | Status: DC

## 2012-03-26 NOTE — Care Management Note (Signed)
    Page 1 of 2   03/26/2012     3:47:43 PM   CARE MANAGEMENT NOTE 03/26/2012  Patient:  [name], [name]   Account Number:  [id number]  Date Initiated:  03/26/2012  Documentation initiated by:  Sharrie Rothman  Subjective/Objective Assessment:   Pt admitted from home with SOB. Pt lives with his wife and will return home at discharge. Pt is independent with ADL's.     Action/Plan:   Prescription given to pt for neb machine. Pt wants to go to [place] to pick up neb machine. HH arranged with [facility] RN. Alroy Bailiff of [facility] is aware and will collect the pts information from the chart.   Anticipated DC Date:  03/26/2012   Anticipated DC Plan:  HOME W HOME HEALTH SERVICES      DC Planning Services  CM consult      [facility] Choice  HOME HEALTH  DURABLE MEDICAL EQUIPMENT   Choice offered to / List presented to:  C-1 Patient   DME arranged  NEBULIZER MACHINE      DME agency  [facility]     HH arranged  HH-1 RN      [facility] agency  [facility]   Status of service:  Completed, signed off Medicare Important Message given?   (If response is "NO", the following Medicare IM given date fields will be blank) Date Medicare IM given:   Date Additional Medicare IM given:    Discharge Disposition:  HOME W HOME HEALTH SERVICES  Per UR Regulation:    If discussed at Long Length of Stay Meetings, dates discussed:    Comments:  03/26/12 1545 Arlyss Queen, RN BSN CM

## 2012-03-26 NOTE — Progress Notes (Signed)
ANTICOAGULATION CONSULT NOTE   Pharmacy Consult for Lovenox and Warfarin Indication: atrial fibrillation  Allergies  Allergen Reactions  . Procaine Hcl Nausea And Vomiting  . Tramadol Nausea And Vomiting    Patient Measurements: Height: 5\' 5"  (165.1 cm) Weight: 203 lb (92.08 kg) IBW/kg (Calculated) : 61.5   Vital Signs: Temp: 97.5 F (36.4 C) (12/19 0551) Temp src: Oral (12/19 0551) BP: 126/88 mmHg (12/19 0551) Pulse Rate: 99  (12/19 0952)  Labs:  [facility] 03/26/12 0515 03/25/12 0513 03/24/12 1015  HGB -- 13.9 14.9  HCT -- 42.3 45.5  PLT -- 152 154  APTT -- -- --  LABPROT 15.8* 15.0 14.6  INR 1.29 1.20 1.16  HEPARINUNFRC -- -- --  CREATININE 1.05 0.99 1.10  CKTOTAL -- -- --  CKMB -- -- --  TROPONINI -- -- <0.30   Estimated Creatinine Clearance: 63.4 ml/min (by C-G formula based on Cr of 1.05).  Medical History: Past Medical History  Diagnosis Date  . Arteriosclerotic cardiovascular disease (ASCVD)   . Diabetes mellitus   . Atrial fibrillation     Onset in 2012  . COPD (chronic obstructive pulmonary disease)   . Gout   . DJD (degenerative joint disease)   . Hyperlipidemia   . Obstructive sleep apnea   . Chronic anticoagulation 2012    2012  . Myocardial infarction   . CHF (congestive heart failure)     Medications:  Prescriptions prior to admission  Medication Sig Dispense Refill  . carvedilol (COREG) 12.5 MG tablet Take 1 tablet (12.5 mg total) by mouth 2 (two) times daily with a meal.  60 tablet  5  . colesevelam (WELCHOL) 625 MG tablet Take 1,875 mg by mouth as directed. Takes 4 tablets day 1, then 2 tablets for the next two days. Then repeat.      . digoxin (LANOXIN) 0.25 MG tablet Take 1 tablet (0.25 mg total) by mouth daily.  30 tablet  3  . docusate sodium (COLACE) 100 MG capsule Take 100 mg by mouth 2 (two) times daily.      . magnesium oxide (MAG-OX) 400 MG tablet Take 400 mg by mouth 2 (two) times daily.        . metFORMIN (GLUCOPHAGE) 500 MG  tablet Take 500 mg by mouth 2 (two) times daily with a meal.        . metoprolol tartrate (LOPRESSOR) 25 MG tablet Take 25 mg by mouth 2 (two) times daily.      . Omega-3 Fatty Acids (FISH OIL BURP-LESS) 1000 MG CAPS Take 1,000 mg by mouth daily.        Marland Kitchen oxyCODONE (OXY IR/ROXICODONE) 5 MG immediate release tablet Take 5 mg by mouth 4 (four) times daily.      . pravastatin (PRAVACHOL) 40 MG tablet Take 40 mg by mouth daily.      . Tamsulosin HCl (FLOMAX) 0.4 MG CAPS Take 0.4 mg by mouth daily after breakfast.      . warfarin (COUMADIN) 4 MG tablet Take 4 mg by mouth daily.        Assessment: 77yo male on chronic warfarin for AFib which was restarted on 12/16 after being held for [facility] after fall x 1 month.  INR still at baseline on admission.   INR rising slowly to goal.  No bleeding noted.  Cover with full dose Lovenox until INR is therapeutic per MD.  Goal of Therapy:  INR 2-3 Monitor platelets by anticoagulation protocol: Yes   Plan: Lovenox 1mg /Kg  sq q12hrs until INR therapeutic Coumadin 7.5mg  today x 1  INR daily CBC per protocol  Elson Clan 03/26/2012,10:05 AM

## 2012-03-26 NOTE — Progress Notes (Signed)
Discharge instructions and prescriptions given, verbalized understanding, out in stable condition via w/c with staff. 

## 2012-03-26 NOTE — Discharge Summary (Signed)
[id number] 

## 2012-03-26 NOTE — Progress Notes (Signed)
[name], [name]                  ACCOUNT NO.:  [id number]  MEDICAL RECORD NO.:  [id number]  LOCATION:  A339                          FACILITY:  [facility]  PHYSICIAN:  Melvyn Novas, MDDATE OF BIRTH:  [birth year]  DATE OF PROCEDURE: DATE OF DISCHARGE:                                PROGRESS NOTE   The patient was admitted with dyspnea, responded somewhat to nebulizer therapy.  Yesterday has ischemic cardiomyopathy, ejection fraction 25- 30%; has COPD, 40-pack-year history of smoking, hypertension, chronic AFib, on anticoagulation, resumed after fall and broken ribs and subarachnoid hemorrhage 1 month ago.  Coumadin was resumed 3 days ago. Currently also covered on Lovenox.  Lungs show diminished breath sounds at bases.  No rales, wheeze, or rhonchi.  Heart; irregularly regular.  No S3.  Blood pressure 98/65, temperature of 98.1, pulse 74 and regular, respiratory rate is 18.  D-dimer mildly elevated at 0.75 with a chest CT angio to rule out any PE.  Continue Lovenox, Coumadin, and nebulizer therapy.  We will look for early discharge if CT angio is negative.     Melvyn Novas, MD     RMD/MEDQ  D:  03/25/2012  T:  03/26/2012  Job:  [id number]

## 2012-03-27 NOTE — Discharge Summary (Signed)
[name], [name]                  ACCOUNT NO.:  [id number]  MEDICAL RECORD NO.:  [id number]  LOCATION:  A339                          FACILITY:  [facility]  PHYSICIAN:  Melvyn Novas, MDDATE OF BIRTH:  [birth year]  DATE OF ADMISSION:  03/24/2012 DATE OF DISCHARGE:  12/19/2013LH                              DISCHARGE SUMMARY   The patient has chronic atrial fibrillation, ischemic cardiomyopathy, ejection fraction of 25-30%.  Had bilateral pedal edema, some anxiety, has some COPD with a 40-pack-year history of smoking.  The patient was admitted, was taken off of Coumadin as a result of subarachnoid hemorrhage approximately 3-4 weeks ago after experiencing fall and fractured ribs.  This healed up well.  He has no neurologic impairment, doing well from that perspective.  He was admitted with some mild CHF and COPD exacerbation.  Given DuoNeb nebulizer, responded dramatically. Given TED hose, which resolved his pedal edema.  There was no evidence of myocardial infarction, was resumed on Coumadin of 4, 2 and 2 mg alternating every 3 days such as this.  The patient understands this, he will receive DuoNeb nebulizer at home, and is to continue all of his other home medicines, which include Coreg 12.5 mg p.o. b.i.d., WelChol 625 mg tablet 3 tablets p.o. b.i.d., digoxin 0.25 mg p.o. daily, Colace 100 mg b.i.d., magnesium oxide 400 mg p.o. b.i.d., metformin 500 p.o. b.i.d., Lopressor tartrate 25 p.o. b.i.d., omega-3 fatty acids 1000 mg p.o. daily, oxycodone 5 mg q.i.d. p.r.n., pravastatin 40 mg p.o. daily, Flomax 0.4 mg p.o. daily, Coumadin 4 mg alternating with 2 mg followed by 2 mg every 3 days rotation, albuterol nebulizer 0.5% 2.5 mg q.i.d. p.r.n., aspirin 81 mg per day, and Atrovent nebulizers 0.02% 0.5 mg q.i.d.  The patient will follow up in the office in 3 days' time to check his PT/INR and clinical status.     Melvyn Novas, MD     RMD/MEDQ  D:  03/26/2012  T:   03/27/2012  Job:  [id number]

## 2012-04-09 ENCOUNTER — Telehealth: Payer: Self-pay | Admitting: *Deleted

## 2012-04-09 NOTE — Telephone Encounter (Signed)
Received incoming call from [place] with [facility] and noted pt has noted a 7lb weightloss in 2 days, including 5lbs in last 24 hours however pt still notes feeling of fluid rushing into his belly when he lays down and a lot of fluid present, lungs are noted as clear via nurse however pt has noted increase in SOB periodically, currently 2-3 plus edema in both legs, pt was taken off lasix however request for lasix/and/or next steps to be called in to [facility], to be advised called into [phone],please advise

## 2012-04-09 NOTE — Telephone Encounter (Signed)
This nurse received verbal instructions from MD Ross to offer pt apt for today, if unavailable to come in, offer an apt for tomorrow, as well as advise if s/s worsen to seek treatment in the ED, pt declined apt for today, accepted apt for 2:30pm tomorrow 04-10-12 with MD Tenny Craw, pt stated " I will go to the ER if I start to feeling worse" Contacted Kristy with [facility] ([facility] and advised update, all instructions understood, MD Tenny Craw advised verbally

## 2012-04-09 NOTE — Telephone Encounter (Signed)
Called pt and [facility] nurse kristy to advise that at this point we have not received a notation from the doctor, advised to reach out to PCP, was advised PCP will not be back in the office until Monday and no other physician available to advise, will have MD PR advise on pt current condition to address at this time and will give [facility] nurse call back with next steps

## 2012-04-09 NOTE — Telephone Encounter (Signed)
Patient set up to be seen in clinic

## 2012-04-10 ENCOUNTER — Ambulatory Visit (INDEPENDENT_AMBULATORY_CARE_PROVIDER_SITE_OTHER): Payer: Medicare Other | Admitting: Internal Medicine

## 2012-04-10 ENCOUNTER — Ambulatory Visit: Payer: Medicare Other | Admitting: Internal Medicine

## 2012-04-10 VITALS — BP 120/80 | HR 66 | Ht 65.0 in | Wt 196.0 lb

## 2012-04-10 DIAGNOSIS — I10 Essential (primary) hypertension: Secondary | ICD-10-CM

## 2012-04-10 LAB — BASIC METABOLIC PANEL
BUN: 12 mg/dL (ref 6–23)
CO2: 33 mEq/L — ABNORMAL HIGH (ref 19–32)
Chloride: 99 mEq/L (ref 96–112)
Glucose, Bld: 151 mg/dL — ABNORMAL HIGH (ref 70–99)
Potassium: 4.2 mEq/L (ref 3.5–5.3)

## 2012-04-10 MED ORDER — FUROSEMIDE 40 MG PO TABS: 40.0000 mg | ORAL_TABLET | ORAL | Status: DC

## 2012-04-10 MED ORDER — POTASSIUM CHLORIDE CRYS ER 20 MEQ PO TBCR: 20.0000 meq | EXTENDED_RELEASE_TABLET | ORAL | Status: DC

## 2012-04-10 NOTE — Patient Instructions (Signed)
Your physician recommends that you schedule a follow-up appointment in: WITH [facility] AFTER LAB DRAW ON 04-17-12  Your physician recommends that you return for lab work in: TODAY (BMET,BNP) SLIPS GIVEN  Your physician recommends that you return for lab work in: ONE WEEK (BMET,BNP) SLIPS TO BE FAXED DRAW ON 04-17-12  Your physician has recommended you make the following change in your medication:   1) START LASIX 40MG  EVERY OTHER DAY 2) START POTASSIUM EVERY OTHER DAY (ON SAME DAY YOU TAKE THE LASIX)

## 2012-04-10 NOTE — Telephone Encounter (Signed)
Pt treated by MD Tenny Craw in office today

## 2012-04-10 NOTE — Progress Notes (Signed)
HPI Patinet is a 78 yo with a history of CAD, ICM and afib  He is followed by Towanda Octave and Harriet Pho.  Last seen in clinic in July 2013.   Patient admitted in Dec with edema.  Had been off coumadin after [facility] discahrge he has not felt well.  Wife and daughter say his weight has been up  IT is now going down. Ankles and legs were very swollen  Now improving. Denies CP Gives out easily.   Allergies  Allergen Reactions  . Procaine Hcl Nausea And Vomiting  . Tramadol Nausea And Vomiting    Current Outpatient Prescriptions  Medication Sig Dispense Refill  . albuterol (PROVENTIL) (5 MG/ML) 0.5% nebulizer solution Take 0.5 mLs (2.5 mg total) by nebulization every 6 (six) hours as needed for wheezing.  20 mL  3  . aspirin 81 MG chewable tablet Chew 1 tablet (81 mg total) by mouth daily.  30 tablet  6  . carvedilol (COREG) 12.5 MG tablet Take 1 tablet (12.5 mg total) by mouth 2 (two) times daily with a meal.  60 tablet  5  . digoxin (LANOXIN) 0.25 MG tablet Take 1 tablet (0.25 mg total) by mouth daily.  30 tablet  3  . docusate sodium (COLACE) 100 MG capsule Take 100 mg by mouth 2 (two) times daily.      Marland Kitchen ipratropium (ATROVENT) 0.02 % nebulizer solution Take 2.5 mLs (0.5 mg total) by nebulization 4 (four) times daily.  75 mL  3  . magnesium oxide (MAG-OX) 400 MG tablet Take 400 mg by mouth 2 (two) times daily.        . metFORMIN (GLUCOPHAGE) 500 MG tablet Take 500 mg by mouth 2 (two) times daily with a meal.        . metoprolol tartrate (LOPRESSOR) 25 MG tablet Take 25 mg by mouth 2 (two) times daily.      . Omega-3 Fatty Acids (FISH OIL BURP-LESS) 1000 MG CAPS Take 1,000 mg by mouth daily.        . pravastatin (PRAVACHOL) 40 MG tablet Take 40 mg by mouth daily.      . Tamsulosin HCl (FLOMAX) 0.4 MG CAPS Take 0.4 mg by mouth daily after breakfast.      . warfarin (COUMADIN) 4 MG tablet Take 4 mg by mouth daily.        Past Medical History  Diagnosis Date  . Arteriosclerotic  cardiovascular disease (ASCVD)   . Diabetes mellitus   . Atrial fibrillation     Onset in 2012  . COPD (chronic obstructive pulmonary disease)   . Gout   . DJD (degenerative joint disease)   . Hyperlipidemia   . Obstructive sleep apnea   . Chronic anticoagulation 2012    2012  . Myocardial infarction   . CHF (congestive heart failure)     Past Surgical History  Procedure Date  . Cholecystectomy   . Knee surgery   . Vasectomy   . Cystoscopy/retrograde/ureteroscopy 06/28/2011    Procedure: CYSTOSCOPY/RETROGRADE/URETEROSCOPY;  Surgeon: Ky Barban, MD;  Location: [facility];  Service: Urology;  Laterality: Right;  . Stone extraction with basket 06/28/2011    Procedure: STONE EXTRACTION WITH BASKET;  Surgeon: Ky Barban, MD;  Location: [facility];  Service: Urology;  Laterality: Right;  specimen given to family per MD    Family History  Problem Relation Age of Onset  . Heart failure Mother   . Heart failure Father     History  Social History  . Marital Status: Married    Spouse Name: N/A    Number of Children: N/A  . Years of Education: N/A   Occupational History  . Not on file.   Social History Main Topics  . Smoking status: Former Smoker -- 1.0 packs/day for 40 years    Types: Cigarettes    Quit date: 04/08/1992  . Smokeless tobacco: Never Used  . Alcohol Use: No  . Drug Use: No  . Sexually Active: No   Other Topics Concern  . Not on file   Social History Narrative  . No narrative on file    Review of Systems:  All systems reviewed.  They are negative to the above problem except as previously stated.  Vital Signs: BP 120/80  Pulse 66  Ht 5\' 5"  (1.651 m)  Wt 196 lb (88.905 kg)  BMI 32.62 kg/m2  SpO2 92%  Physical Exam Patient is in NAD HEENT:  Normocephalic, atraumatic. EOMI, PERRLA.  Neck: JVP is normal.  No bruits.  Lungs: clear to auscultation. No rales no wheezes.  Heart: Irregular rate and rhythm. Normal S1, S2. No S3.   No significant  murmurs. PMI not displaced.  Abdomen:  Supple, nontender. Normal bowel sounds. No masses. No hepatomegaly.  Extremities:   Good distal pulses throughout. 1+ lower extremity edema.  Musculoskeletal :moving all extremities.  Neuro:   alert and oriented x3.  CN II-XII grossly intact.  EKG  Afib 66 bpm.  Nonspecific IVCD  Occasional PVC Assessment and Plan:  1.  SOB  The patinet has some volume increase on exam.  Will check labs before adjusting meds  2.  CAD  I am not convinced of active angina  3.  Afib  Rates controlled  Not on coumadin. Due to [facility]

## 2012-04-14 NOTE — Addendum Note (Signed)
Addended by: Derry Lory A on: 04/14/2012 10:51 AM   Modules accepted: Orders

## 2012-04-21 ENCOUNTER — Ambulatory Visit: Payer: Medicare Other | Admitting: Adult Health

## 2012-04-29 ENCOUNTER — Ambulatory Visit (INDEPENDENT_AMBULATORY_CARE_PROVIDER_SITE_OTHER): Payer: Medicare Other | Admitting: Adult Health

## 2012-04-29 ENCOUNTER — Encounter: Payer: Self-pay | Admitting: Adult Health

## 2012-04-29 VITALS — BP 90/62 | HR 60 | Ht 64.0 in | Wt 187.0 lb

## 2012-04-29 DIAGNOSIS — I7090 Unspecified atherosclerosis: Secondary | ICD-10-CM

## 2012-04-29 DIAGNOSIS — S066X9A Traumatic subarachnoid hemorrhage with loss of consciousness of unspecified duration, initial encounter: Secondary | ICD-10-CM

## 2012-04-29 DIAGNOSIS — I4891 Unspecified atrial fibrillation: Secondary | ICD-10-CM

## 2012-04-29 DIAGNOSIS — I2510 Atherosclerotic heart disease of native coronary artery without angina pectoris: Secondary | ICD-10-CM

## 2012-04-29 MED ORDER — CARVEDILOL 25 MG PO TABS: 25.0000 mg | ORAL_TABLET | Freq: Two times a day (BID) | ORAL | Status: DC

## 2012-04-29 NOTE — Assessment & Plan Note (Addendum)
[name] is on both coreg 12.5 mg BID and metoprolol 25 mg BID. I will d/c the metoprolol and increase coreg to 25 mg BID. Echocardiogram completed in December continues to demonstrate significant systolic dysfunction with  EF of 20%, akinesis of the inferior myocardium, mid and distal anterseptal region and apex. He will be referred to EP for AICD consideration, as medical therapy has failed to iimprove his LV fx.  He denies that his fall in November was related to pre-syncope or syncope. He remembers every detail.   I have spent >45 minutes with this patient and his wife in clinic today.

## 2012-04-29 NOTE — Progress Notes (Deleted)
Name: [name]    DOB: 02-02-1935  Age: 78 y.o.  MR#: [id number]       PCP:  [name], MD      Insurance: @PAYORNAME @   CC:   No chief complaint on file.   VS BP 90/62  Pulse 60  Ht 5\' 4"  (1.626 m)  Wt 187 lb (84.823 kg)  BMI 32.10 kg/m2  Weights Current Weight  04/29/12 187 lb (84.823 kg)  04/10/12 196 lb (88.905 kg)  03/24/12 203 lb (92.08 kg)    Blood Pressure  BP Readings from Last 3 Encounters:  04/29/12 90/62  04/10/12 120/80  03/26/12 114/71     Admit date:  (Not on file) Last encounter with RMR:  03/24/2012   Allergy Allergies  Allergen Reactions  . Procaine Hcl Nausea And Vomiting  . Tramadol Nausea And Vomiting    Current Outpatient Prescriptions  Medication Sig Dispense Refill  . albuterol (PROVENTIL) (5 MG/ML) 0.5% nebulizer solution Take 0.5 mLs (2.5 mg total) by nebulization every 6 (six) hours as needed for wheezing.  20 mL  3  . aspirin 81 MG chewable tablet Chew 1 tablet (81 mg total) by mouth daily.  30 tablet  6  . carvedilol (COREG) 12.5 MG tablet Take 1 tablet (12.5 mg total) by mouth 2 (two) times daily with a meal.  60 tablet  5  . digoxin (LANOXIN) 0.25 MG tablet Take 1 tablet (0.25 mg total) by mouth daily.  30 tablet  3  . docusate sodium (COLACE) 100 MG capsule Take 100 mg by mouth 2 (two) times daily.      . furosemide (LASIX) 40 MG tablet Take 1 tablet (40 mg total) by mouth every other day.  90 tablet  1  . ipratropium (ATROVENT) 0.02 % nebulizer solution Take 2.5 mLs (0.5 mg total) by nebulization 4 (four) times daily.  75 mL  3  . magnesium oxide (MAG-OX) 400 MG tablet Take 400 mg by mouth 2 (two) times daily.        . metFORMIN (GLUCOPHAGE) 500 MG tablet Take 500 mg by mouth 2 (two) times daily with a meal.        . metoprolol tartrate (LOPRESSOR) 25 MG tablet Take 25 mg by mouth 2 (two) times daily.      . Omega-3 Fatty Acids (FISH OIL BURP-LESS) 1000 MG CAPS Take 1,000 mg by mouth daily.        Marland Kitchen oxycodone (OXY-IR) 5 MG  capsule Take 5 mg by mouth every 4 (four) hours as needed.      . potassium chloride SA (K-DUR,KLOR-CON) 20 MEQ tablet Take 1 tablet (20 mEq total) by mouth every other day.  90 tablet  1  . pravastatin (PRAVACHOL) 40 MG tablet Take 40 mg by mouth daily.      . Tamsulosin HCl (FLOMAX) 0.4 MG CAPS Take 0.4 mg by mouth daily after breakfast.      . warfarin (COUMADIN) 4 MG tablet Take 4 mg by mouth daily.        Discontinued Meds:   There are no discontinued medications.  Patient Active Problem List  Diagnosis  . DIABETES MELLITUS, TYPE II  . OBSTRUCTIVE SLEEP APNEA  . Arteriosclerotic cardiovascular disease (ASCVD)  . Atrial fibrillation  . COPD (chronic obstructive pulmonary disease)  . Laboratory test  . Chronic anticoagulation  . Hyperkalemia  . Ureterolithiasis  . Fall  . Traumatic subarachnoid hemorrhage  . Multiple fractures of ribs of left side  LABS Office Visit on 04/10/2012  Component Date Value  . Sodium 04/10/2012 143   . Potassium 04/10/2012 4.2   . Chloride 04/10/2012 99   . CO2 04/10/2012 33*  . Glucose, Bld 04/10/2012 151*  . BUN 04/10/2012 12   . Creat 04/10/2012 1.07   . Calcium 04/10/2012 10.1   . Brain Natriuretic Peptide 04/10/2012 623.8*  Admission on 03/24/2012, Discharged on 03/26/2012  Component Date Value  . WBC 03/24/2012 7.8   . RBC 03/24/2012 4.65   . Hemoglobin 03/24/2012 14.9   . HCT 03/24/2012 45.5   . MCV 03/24/2012 97.8   . [facility] 03/24/2012 32.0   . MCHC 03/24/2012 32.7   . RDW 03/24/2012 14.3   . Platelets 03/24/2012 154   . Neutrophils Relative 03/24/2012 67   . Neutro Abs 03/24/2012 5.2   . Lymphocytes Relative 03/24/2012 23   . Lymphs Abs 03/24/2012 1.8   . Monocytes Relative 03/24/2012 9   . Monocytes Absolute 03/24/2012 0.7   . Eosinophils Relative 03/24/2012 1   . Eosinophils Absolute 03/24/2012 0.1   . Basophils Relative 03/24/2012 0   . Basophils Absolute 03/24/2012 0.0   . Sodium 03/24/2012 139   . Potassium  03/24/2012 4.1   . Chloride 03/24/2012 100   . CO2 03/24/2012 30   . Glucose, Bld 03/24/2012 184*  . BUN 03/24/2012 12   . Creatinine, Ser 03/24/2012 1.10   . Calcium 03/24/2012 9.3   . GFR calc non Af Amer 03/24/2012 64*  . GFR calc Af Amer 03/24/2012 74*  . Troponin I 03/24/2012 <0.30   . Magnesium 03/24/2012 1.5   . TSH 03/24/2012 1.367   . Ammonia 03/24/2012 16   . Prothrombin Time 03/24/2012 14.6   . INR 03/24/2012 1.16   . Sodium 03/25/2012 139   . Potassium 03/25/2012 4.0   . Chloride 03/25/2012 101   . CO2 03/25/2012 28   . Glucose, Bld 03/25/2012 139*  . BUN 03/25/2012 12   . Creatinine, Ser 03/25/2012 0.99   . Calcium 03/25/2012 9.3   . GFR calc non Af Amer 03/25/2012 78*  . GFR calc Af Amer 03/25/2012 >90   . Total Protein 03/25/2012 6.2   . Albumin 03/25/2012 3.7   . AST 03/25/2012 19   . ALT 03/25/2012 13   . Alkaline Phosphatase 03/25/2012 70   . Total Bilirubin 03/25/2012 0.8   . Bilirubin, Direct 03/25/2012 0.2   . Indirect Bilirubin 03/25/2012 0.6   . Digoxin Level 03/25/2012 0.4*  . Prothrombin Time 03/25/2012 15.0   . INR 03/25/2012 1.20   . WBC 03/25/2012 6.8   . RBC 03/25/2012 4.31   . Hemoglobin 03/25/2012 13.9   . HCT 03/25/2012 42.3   . MCV 03/25/2012 98.1   . [facility] 03/25/2012 32.3   . MCHC 03/25/2012 32.9   . RDW 03/25/2012 14.3   . Platelets 03/25/2012 152   . Glucose-Capillary 03/24/2012 121*  . Comment 1 03/24/2012 Notify RN   . Comment 2 03/24/2012 Documented in Chart   . Glucose-Capillary 03/24/2012 160*  . Comment 1 03/24/2012 Notify RN   . Comment 2 03/24/2012 Documented in Chart   . D-Dimer, Quant 03/25/2012 0.75*  . Pro B Natriuretic peptid* 03/25/2012 3742.0*  . Glucose-Capillary 03/25/2012 122*  . Comment 1 03/25/2012 Notify RN   . Comment 2 03/25/2012 Documented in Chart   . Glucose-Capillary 03/25/2012 154*  . Comment 1 03/25/2012 Notify RN   . Comment 2 03/25/2012 Documented in Chart   .  Sodium 03/26/2012 139   .  Potassium 03/26/2012 3.9   . Chloride 03/26/2012 99   . CO2 03/26/2012 30   . Glucose, Bld 03/26/2012 156*  . BUN 03/26/2012 14   . Creatinine, Ser 03/26/2012 1.05   . Calcium 03/26/2012 9.8   . GFR calc non Af Amer 03/26/2012 67*  . GFR calc Af Amer 03/26/2012 78*  . Total Protein 03/26/2012 6.7   . Albumin 03/26/2012 3.8   . AST 03/26/2012 19   . ALT 03/26/2012 14   . Alkaline Phosphatase 03/26/2012 76   . Total Bilirubin 03/26/2012 0.6   . Bilirubin, Direct 03/26/2012 0.2   . Indirect Bilirubin 03/26/2012 0.4   . Prothrombin Time 03/26/2012 15.8*  . INR 03/26/2012 1.29   . Glucose-Capillary 03/25/2012 127*  . Comment 1 03/25/2012 Notify RN   . Comment 2 03/25/2012 Documented in Chart   . Glucose-Capillary 03/25/2012 149*  . Comment 1 03/25/2012 Notify RN   . Comment 2 03/25/2012 Documented in Chart   Admission on 03/06/2012, Discharged on 03/06/2012  Component Date Value  . WBC 03/06/2012 PATIENT IDENTIFICATION ERROR. PLEASE DISREGARD RESULTS. ACCOUNT WILL BE CREDITED.   Marland Kitchen RBC 03/06/2012 PATIENT IDENTIFICATION ERROR. PLEASE DISREGARD RESULTS. ACCOUNT WILL BE CREDITED.   Marland Kitchen Hemoglobin 03/06/2012 PATIENT IDENTIFICATION ERROR. PLEASE DISREGARD RESULTS. ACCOUNT WILL BE CREDITED.   Marland Kitchen HCT 03/06/2012 PATIENT IDENTIFICATION ERROR. PLEASE DISREGARD RESULTS. ACCOUNT WILL BE CREDITED.   Marland Kitchen MCV 03/06/2012 PATIENT IDENTIFICATION ERROR. PLEASE DISREGARD RESULTS. ACCOUNT WILL BE CREDITED.   Marland Kitchen [facility] 03/06/2012 PATIENT IDENTIFICATION ERROR. PLEASE DISREGARD RESULTS. ACCOUNT WILL BE CREDITED.   Marland Kitchen MCHC 03/06/2012 PATIENT IDENTIFICATION ERROR. PLEASE DISREGARD RESULTS. ACCOUNT WILL BE CREDITED.   . RDW 03/06/2012 PATIENT IDENTIFICATION ERROR. PLEASE DISREGARD RESULTS. ACCOUNT WILL BE CREDITED.   Marland Kitchen Platelets 03/06/2012 PATIENT IDENTIFICATION ERROR. PLEASE DISREGARD RESULTS. ACCOUNT WILL BE CREDITED.   Marland Kitchen Neutrophils Relative 03/06/2012 PATIENT IDENTIFICATION ERROR. PLEASE DISREGARD RESULTS. ACCOUNT WILL  BE CREDITED.   Marland Kitchen Neutro Abs 03/06/2012 PATIENT IDENTIFICATION ERROR. PLEASE DISREGARD RESULTS. ACCOUNT WILL BE CREDITED.   Marland Kitchen Lymphocytes Relative 03/06/2012 PATIENT IDENTIFICATION ERROR. PLEASE DISREGARD RESULTS. ACCOUNT WILL BE CREDITED.   Marland Kitchen Lymphs Abs 03/06/2012 PATIENT IDENTIFICATION ERROR. PLEASE DISREGARD RESULTS. ACCOUNT WILL BE CREDITED.   . Monocytes Relative 03/06/2012 PATIENT IDENTIFICATION ERROR. PLEASE DISREGARD RESULTS. ACCOUNT WILL BE CREDITED.   . Monocytes Absolute 03/06/2012 PATIENT IDENTIFICATION ERROR. PLEASE DISREGARD RESULTS. ACCOUNT WILL BE CREDITED.   Marland Kitchen Eosinophils Relative 03/06/2012 PATIENT IDENTIFICATION ERROR. PLEASE DISREGARD RESULTS. ACCOUNT WILL BE CREDITED.   Marland Kitchen Eosinophils Absolute 03/06/2012 PATIENT IDENTIFICATION ERROR. PLEASE DISREGARD RESULTS. ACCOUNT WILL BE CREDITED.   . Basophils Relative 03/06/2012 PATIENT IDENTIFICATION ERROR. PLEASE DISREGARD RESULTS. ACCOUNT WILL BE CREDITED.   . Basophils Absolute 03/06/2012 PATIENT IDENTIFICATION ERROR. PLEASE DISREGARD RESULTS. ACCOUNT WILL BE CREDITED.   . Sodium 03/06/2012 139   . Potassium 03/06/2012 3.9   . Chloride 03/06/2012 98   . CO2 03/06/2012 30   . Glucose, Bld 03/06/2012 145*  . BUN 03/06/2012 10   . Creatinine, Ser 03/06/2012 0.92   . Calcium 03/06/2012 9.9   . GFR calc non Af Amer 03/06/2012 80*  . GFR calc Af Amer 03/06/2012 >90   . WBC 03/06/2012 7.7   . RBC 03/06/2012 4.59   . Hemoglobin 03/06/2012 14.9   . HCT 03/06/2012 44.4   . MCV 03/06/2012 96.7   . [facility] 03/06/2012 32.5   . MCHC 03/06/2012 33.6   . RDW 03/06/2012 14.1   . Platelets 03/06/2012  168   . Neutrophils Relative 03/06/2012 59   . Neutro Abs 03/06/2012 4.5   . Lymphocytes Relative 03/06/2012 31   . Lymphs Abs 03/06/2012 2.4   . Monocytes Relative 03/06/2012 9   . Monocytes Absolute 03/06/2012 0.7   . Eosinophils Relative 03/06/2012 1   . Eosinophils Absolute 03/06/2012 0.1   . Basophils Relative 03/06/2012 0   . Basophils  Absolute 03/06/2012 0.0   Admission on 02/27/2012, Discharged on 02/28/2012  Component Date Value  . WBC 02/27/2012 8.2   . RBC 02/27/2012 4.25   . Hemoglobin 02/27/2012 13.7   . HCT 02/27/2012 41.4   . MCV 02/27/2012 97.4   . [facility] 02/27/2012 32.2   . MCHC 02/27/2012 33.1   . RDW 02/27/2012 13.9   . Platelets 02/27/2012 146*  . Neutrophils Relative 02/27/2012 73   . Neutro Abs 02/27/2012 6.0   . Lymphocytes Relative 02/27/2012 17   . Lymphs Abs 02/27/2012 1.4   . Monocytes Relative 02/27/2012 9   . Monocytes Absolute 02/27/2012 0.8   . Eosinophils Relative 02/27/2012 1   . Eosinophils Absolute 02/27/2012 0.1   . Basophils Relative 02/27/2012 0   . Basophils Absolute 02/27/2012 0.0   . Sodium 02/27/2012 135   . Potassium 02/27/2012 4.0   . Chloride 02/27/2012 99   . CO2 02/27/2012 27   . Glucose, Bld 02/27/2012 263*  . BUN 02/27/2012 14   . Creatinine, Ser 02/27/2012 0.92   . Calcium 02/27/2012 9.4   . GFR calc non Af Amer 02/27/2012 80*  . GFR calc Af Amer 02/27/2012 >90   . Prothrombin Time 02/27/2012 18.4*  . INR 02/27/2012 1.58*  . MRSA by PCR 02/27/2012 NEGATIVE   . Prothrombin Time 02/28/2012 18.8*  . INR 02/28/2012 1.63*  . WBC 02/28/2012 10.5   . RBC 02/28/2012 4.23   . Hemoglobin 02/28/2012 13.4   . HCT 02/28/2012 41.1   . MCV 02/28/2012 97.2   . [facility] 02/28/2012 31.7   . MCHC 02/28/2012 32.6   . RDW 02/28/2012 14.0   . Platelets 02/28/2012 136*  . Sodium 02/28/2012 135   . Potassium 02/28/2012 3.8   . Chloride 02/28/2012 100   . CO2 02/28/2012 25   . Glucose, Bld 02/28/2012 164*  . BUN 02/28/2012 12   . Creatinine, Ser 02/28/2012 0.75   . Calcium 02/28/2012 8.8   . GFR calc non Af Amer 02/28/2012 88*  . GFR calc Af Amer 02/28/2012 >90   . Hemoglobin A1C 02/28/2012 8.1*  . Mean Plasma Glucose 02/28/2012 186*  . Glucose-Capillary 02/28/2012 137*  . Glucose-Capillary 02/28/2012 202*  . Glucose-Capillary 02/28/2012 193*  . Comment 1 02/28/2012 Notify RN    . Comment 2 02/28/2012 Documented in Chart   Admission on 02/02/2012, Discharged on 02/03/2012  Component Date Value  . Color, Urine 02/02/2012 YELLOW   . APPearance 02/02/2012 CLEAR   . Specific Gravity, Urine 02/02/2012 1.025   . pH 02/02/2012 6.0   . Glucose, UA 02/02/2012 NEGATIVE   . Hgb urine dipstick 02/02/2012 LARGE*  . Bilirubin Urine 02/02/2012 SMALL*  . Ketones, ur 02/02/2012 NEGATIVE   . Protein, ur 02/02/2012 NEGATIVE   . Urobilinogen, UA 02/02/2012 0.2   . Nitrite 02/02/2012 NEGATIVE   . Leukocytes, UA 02/02/2012 NEGATIVE   . WBC, UA 02/02/2012 3-6   . RBC / HPF 02/02/2012 11-20   . Bacteria, UA 02/02/2012 RARE   . WBC 02/02/2012 9.5   . RBC 02/02/2012 4.73   . Hemoglobin 02/02/2012  15.7   . HCT 02/02/2012 45.7   . MCV 02/02/2012 96.6   . [facility] 02/02/2012 33.2   . MCHC 02/02/2012 34.4   . RDW 02/02/2012 13.5   . Platelets 02/02/2012 148*  . Neutrophils Relative 02/02/2012 72   . Neutro Abs 02/02/2012 6.8   . Lymphocytes Relative 02/02/2012 18   . Lymphs Abs 02/02/2012 1.7   . Monocytes Relative 02/02/2012 9   . Monocytes Absolute 02/02/2012 0.8   . Eosinophils Relative 02/02/2012 1   . Eosinophils Absolute 02/02/2012 0.1   . Basophils Relative 02/02/2012 0   . Basophils Absolute 02/02/2012 0.0   . Sodium 02/02/2012 136   . Potassium 02/02/2012 4.1   . Chloride 02/02/2012 97   . CO2 02/02/2012 26   . Glucose, Bld 02/02/2012 168*  . BUN 02/02/2012 10   . Creatinine, Ser 02/02/2012 0.88   . Calcium 02/02/2012 10.1   . Total Protein 02/02/2012 7.0   . Albumin 02/02/2012 4.0   . AST 02/02/2012 34   . ALT 02/02/2012 24   . Alkaline Phosphatase 02/02/2012 61   . Total Bilirubin 02/02/2012 0.8   . GFR calc non Af Amer 02/02/2012 82*  . GFR calc Af Amer 02/02/2012 >90   . Specific Gravity, Urine 02/02/2012 PENDING   . Prothrombin Time 02/02/2012 19.0*  . INR 02/02/2012 1.65*  . Specimen Description 02/02/2012 URINE, CLEAN CATCH   . Special Requests  02/02/2012 NONE   . Culture  Setup Time 02/02/2012 02/02/2012 23:45   . Colony Count 02/02/2012 6,000 COLONIES/ML   . Culture 02/02/2012 INSIGNIFICANT GROWTH   . Report Status 02/02/2012 02/04/2012 FINAL      Results for this Opt Visit:     Results for orders placed in visit on 04/10/12  BASIC METABOLIC PANEL      Component Value Range   Sodium 143  135 - 145 mEq/L   Potassium 4.2  3.5 - 5.3 mEq/L   Chloride 99  96 - 112 mEq/L   CO2 33 (*) 19 - 32 mEq/L   Glucose, Bld 151 (*) 70 - 99 mg/dL   BUN 12  6 - 23 mg/dL   Creat 1.61  0.96 - 0.45 mg/dL   Calcium 40.9  8.4 - 81.1 mg/dL  BRAIN NATRIURETIC PEPTIDE      Component Value Range   Brain Natriuretic Peptide 623.8 (*) 0.0 - 100.0 pg/mL    EKG Orders placed in visit on 04/10/12  . EKG 12-LEAD     Prior Assessment and Plan Problem List as of 04/29/2012            Cardiology Problems   Arteriosclerotic cardiovascular disease (ASCVD)   Last Assessment & Plan Note   11/01/2011 Office Visit Addendum 11/01/2011  4:29 PM by Jodelle Gross, NP    He has no symptoms to suggest cardiac  Ischemia. Repeat echocardiogram completed in February of 2013, read by Dr. Dietrich Pates, revealed upper normal left ventricular chamber size, with mild left ventricular hypertrophy. The LVEF was reduced in the range of 25-30%. There is akinesis and scarring of the anterior septal and apical wall, akinesis of the inferior apical wall as well. Left atrium was moderately dilated. He did state that April fibrillation was present limiting the assessment of diastolic function.  No medication changes are recommended at this time. Low EF probably related to atrial fibrillation. No evidence of CHF. Weight is stable. Review of labs completed on 7/22/ 2013 per Dr. Roe Coombs Diego's office demonstrating sodium of  138, potassium 4.5, creatinine 0.95, we will continue to monitor this patient closely. He will follow with Dr. Dietrich Pates on next appointment.    Atrial fibrillation     Last Assessment & Plan Note   11/01/2011 Office Visit Signed 11/01/2011  4:24 PM by Jodelle Gross, NP    Heart rate is well-controlled. He has no complaints of rapid heart rate or palpitations. He is followed by Dr. Delbert Harness in his office for Coumadin dosing. No evidence of bleeding. No evidence of fatigue. Would continue current medications as they are working well for him with no symptoms. He will be seen in 6 months unless he requires cardiac evaluation sooner.    Traumatic subarachnoid hemorrhage     Other   DIABETES MELLITUS, TYPE II   Last Assessment & Plan Note   04/29/2011 Office Visit Signed 04/29/2011  7:12 PM by Kathlen Brunswick, MD    Diabetic control appears adequate.  Dr. Janna Arch is managing this problem.    OBSTRUCTIVE SLEEP APNEA   COPD (chronic obstructive pulmonary disease)   Last Assessment & Plan Note   04/29/2011 Office Visit Signed 04/29/2011  7:11 PM by Kathlen Brunswick, MD    Patient is asymptomatic with respect to lung disease at present.    Laboratory test   Chronic anticoagulation   Last Assessment & Plan Note   04/29/2011 Office Visit Signed 04/29/2011  7:11 PM by Kathlen Brunswick, MD    CBC and FOBT will be monitored to exclude occult GI blood loss.    Hyperkalemia   Ureterolithiasis   Fall   Multiple fractures of ribs of left side       Imaging: No results found.   FRS Calculation: Score not calculated. Missing: Total Cholesterol

## 2012-04-29 NOTE — Assessment & Plan Note (Signed)
Caution with re-institution of anticoagulant in this setting. Dr. Delbert Harness is managing dosing. He will need to come off of the medication prior to ICD placement should this be an option for him.

## 2012-04-29 NOTE — Progress Notes (Signed)
HPI: [name] is a 78 y/o patient of Dr.Rothbart, but most recently seen by Dr.Ross, that we are following for ongoing assessment and treatment of Ischemic CM with systolic dysfunction, chronic atrial fib (coumadin dosing by Dr.Don Cecelia Byars), CAD, with recent history of traumatic subarachnoid hemorrhage after a fall in November 2013. Coumadin was held temporarily but restarted by Dr.DonDiego last month. He has lost 10 lbs since being seen last after medication adjustment of lasix to 40 mg daily from every other day. He denies dizziness, near syncope, palpitations or chest pain. He talks obsessively about fall he took in November.  Allergies  Allergen Reactions  . Procaine Hcl Nausea And Vomiting  . Tramadol Nausea And Vomiting    Current Outpatient Prescriptions  Medication Sig Dispense Refill  . albuterol (PROVENTIL) (5 MG/ML) 0.5% nebulizer solution Take 0.5 mLs (2.5 mg total) by nebulization every 6 (six) hours as needed for wheezing.  20 mL  3  . aspirin 81 MG chewable tablet Chew 1 tablet (81 mg total) by mouth daily.  30 tablet  6  . carvedilol (COREG) 25 MG tablet Take 1 tablet (25 mg total) by mouth 2 (two) times daily with a meal.  60 tablet  5  . digoxin (LANOXIN) 0.25 MG tablet Take 1 tablet (0.25 mg total) by mouth daily.  30 tablet  3  . docusate sodium (COLACE) 100 MG capsule Take 100 mg by mouth 2 (two) times daily.      . furosemide (LASIX) 40 MG tablet Take 1 tablet (40 mg total) by mouth every other day.  90 tablet  1  . ipratropium (ATROVENT) 0.02 % nebulizer solution Take 2.5 mLs (0.5 mg total) by nebulization 4 (four) times daily.  75 mL  3  . magnesium oxide (MAG-OX) 400 MG tablet Take 400 mg by mouth 2 (two) times daily.        . metFORMIN (GLUCOPHAGE) 500 MG tablet Take 500 mg by mouth 2 (two) times daily with a meal.        . Omega-3 Fatty Acids (FISH OIL BURP-LESS) 1000 MG CAPS Take 1,000 mg by mouth daily.        Marland Kitchen oxycodone (OXY-IR) 5 MG capsule Take 5 mg by mouth  every 4 (four) hours as needed.      . potassium chloride SA (K-DUR,KLOR-CON) 20 MEQ tablet Take 1 tablet (20 mEq total) by mouth every other day.  90 tablet  1  . pravastatin (PRAVACHOL) 40 MG tablet Take 40 mg by mouth daily.      . Tamsulosin HCl (FLOMAX) 0.4 MG CAPS Take 0.4 mg by mouth daily after breakfast.      . warfarin (COUMADIN) 4 MG tablet Take 4 mg by mouth daily.        Past Medical History  Diagnosis Date  . Arteriosclerotic cardiovascular disease (ASCVD)   . Diabetes mellitus   . Atrial fibrillation     Onset in 2012  . COPD (chronic obstructive pulmonary disease)   . Gout   . DJD (degenerative joint disease)   . Hyperlipidemia   . Obstructive sleep apnea   . Chronic anticoagulation 2012    2012  . Myocardial infarction   . CHF (congestive heart failure)     Past Surgical History  Procedure Date  . Cholecystectomy   . [name] surgery   . Vasectomy   . Cystoscopy/retrograde/ureteroscopy 06/28/2011    Procedure: CYSTOSCOPY/RETROGRADE/URETEROSCOPY;  Surgeon: Ky Barban, MD;  Location: [facility];  Service: Urology;  Laterality: Right;  . Stone extraction with basket 06/28/2011    Procedure: STONE EXTRACTION WITH BASKET;  Surgeon: Ky Barban, MD;  Location: [facility];  Service: Urology;  Laterality: Right;  specimen given to family per MD    [id number]:[id number] of systems complete and found to be negative unless listed above  PHYSICAL EXAM BP 90/62  Pulse 60  Ht 5\' 4"  (1.626 m)  Wt 187 lb (84.823 kg)  BMI 32.10 kg/m2  General: Well developed, well nourished, in no acute distress Head: Eyes PERRLA, No xanthomas.   Normal cephalic and atramatic  Lungs: Clear bilaterally to auscultation and percussion. Heart: HRIR S1 S2, without MRG.  Pulses are 2+ & equal.            No carotid bruit. No JVD.  No abdominal bruits. No femoral bruits. Abdomen: Bowel sounds are positive, abdomen soft and non-tender without masses or                  Hernia's noted. Msk:  Back  normal, normal gait. Normal strength and tone for age. Extremities: No clubbing, cyanosis or edema.  DP +1 Neuro: Alert and oriented X 3. Psych:  Good affect, responds appropriately    ASSESSMENT AND PLAN

## 2012-04-29 NOTE — Assessment & Plan Note (Addendum)
Heart rate is well controlled on duplication of BB, both metoprolol and coreg along with digoxin. I have taken him off of the metoprolol and increased the dose of carvediolol to 25 mg BID.  Continues on coumadin therapy. Will need close monitoring. Magnesium is 1.5 on recent labs on 1/15. He is on magnesium 400 mg BID, but states he was told to take it daily. We need to have one person adjusting cardiac medications and therapy.

## 2012-04-29 NOTE — Patient Instructions (Addendum)
Your physician recommends that you schedule a follow-up appointment in:  1 - Dr Ladona Ridgel for discussion of Defibrillator placement 2 - Appt with Samara Deist after you have seen Dr Ladona Ridgel  Your physician has recommended you make the following change in your medication:  1 - STOP Metoprolol 2 - INCREASE Coreg to 25 mg twice a day

## 2012-05-08 ENCOUNTER — Other Ambulatory Visit (HOSPITAL_COMMUNITY): Payer: Self-pay | Admitting: Cardiovascular Disease

## 2012-05-08 DIAGNOSIS — I4891 Unspecified atrial fibrillation: Secondary | ICD-10-CM

## 2012-05-21 ENCOUNTER — Encounter (HOSPITAL_COMMUNITY): Payer: Medicare Other

## 2012-06-02 ENCOUNTER — Ambulatory Visit (HOSPITAL_COMMUNITY)
Admission: RE | Admit: 2012-06-02 | Discharge: 2012-06-02 | Disposition: A | Discharge status: Home / Self Care | Payer: Medicare Other | Source: Ambulatory Visit | Attending: Cardiovascular Disease | Admitting: Cardiovascular Disease

## 2012-06-02 DIAGNOSIS — J449 Chronic obstructive pulmonary disease, unspecified: Secondary | ICD-10-CM | POA: Insufficient documentation

## 2012-06-02 DIAGNOSIS — I4891 Unspecified atrial fibrillation: Secondary | ICD-10-CM

## 2012-06-02 DIAGNOSIS — I10 Essential (primary) hypertension: Secondary | ICD-10-CM | POA: Insufficient documentation

## 2012-06-02 DIAGNOSIS — Z8249 Family history of ischemic heart disease and other diseases of the circulatory system: Secondary | ICD-10-CM | POA: Insufficient documentation

## 2012-06-02 DIAGNOSIS — J4489 Other specified chronic obstructive pulmonary disease: Secondary | ICD-10-CM | POA: Insufficient documentation

## 2012-06-02 DIAGNOSIS — E119 Type 2 diabetes mellitus without complications: Secondary | ICD-10-CM | POA: Insufficient documentation

## 2012-06-02 HISTORY — PX: OTHER SURGICAL HISTORY: SHX169

## 2012-06-02 MED ORDER — TECHNETIUM TC 99M SESTAMIBI GENERIC - CARDIOLITE
30.0000 | Freq: Once | INTRAVENOUS | Status: AC | PRN
  Administered 2012-06-02: 30 via INTRAVENOUS

## 2012-06-02 MED ORDER — TECHNETIUM TC 99M SESTAMIBI GENERIC - CARDIOLITE
10.0000 | Freq: Once | INTRAVENOUS | Status: AC | PRN
  Administered 2012-06-02: 10 via INTRAVENOUS

## 2012-06-02 MED ORDER — REGADENOSON 0.4 MG/5ML IV SOLN
0.4000 mg | Freq: Once | INTRAVENOUS | Status: AC
  Administered 2012-06-02: 0.4 mg via INTRAVENOUS

## 2012-06-02 NOTE — Procedures (Addendum)
[place] [facility] [address] [place] [address] [place] [address] [phone]  Cardiology Nuclear Med Study  [address] [name] is a 78 y.o. male     MRN : [id number]     DOB: 1935-01-22  Procedure Date: 06/02/2012  Nuclear Med Background Indication for Stress Test:  Stent Patency and Post Hospital History:  COPD and MI-07/1996;PTCA W/STENT PLACEMENT 07/1996;AF Cardiac Risk Factors: Carotid Disease, Family History - CAD, Hypertension, Lipids, NIDDM and Obesity  Symptoms:  DENIES ANY CURRENT OR RECENT SYMPTOMS   Nuclear Pre-Procedure Caffeine/Decaff Intake:  1:00am NPO After: 11 AM   IV Site: R Antecubital  IV 0.9% NS with Angio Cath:  22g  Chest Size (in):  40" IV Started by: Emmit Pomfret, RN  Height: 5\' 2"  (1.575 m)  Cup Size: n/a  BMI:  Body mass index is 33.65 kg/(m^2). Weight:  184 lb (83.462 kg)   Tech Comments:  N/A    Nuclear Med Study 1 or 2 day study: 1 day  Stress Test Type:  Lexiscan  Order Authorizing Provider:  Benny Lennert   Resting Radionuclide: Technetium 60m Sestamibi  Resting Radionuclide Dose: 10.2 mCi   Stress Radionuclide:  Technetium 13m Sestamibi  Stress Radionuclide Dose: 30.3 mCi           Stress Protocol Rest HR: 71 Stress HR: 77  Rest BP: 107/70 Stress BP: 91/59  Exercise Time (min): n/a METS: n/a   Predicted Max HR: 145 bpm % Max HR: 53.1 bpm Rate Pressure Product: 8239  Dose of Adenosine (mg):  n/a Dose of Lexiscan: 0.4 mg  Dose of Atropine (mg): n/a Dose of Dobutamine: n/a mcg/kg/min (at max HR)  Stress Test Technologist: Esperanza Sheets, CCT Nuclear Technologist: Koren Shiver, CNMT   Rest Procedure:  Myocardial perfusion imaging was performed at rest 45 minutes following the intravenous administration of Technetium 3m Sestamibi. Stress Procedure:  The patient received IV Lexiscan 0.4 mg over 15-seconds.  Technetium 56m Sestamibi injected at 30-seconds.  There were no significant changes with  Lexiscan.  Quantitative spect images were obtained after a 45 minute delay.  Transient Ischemic Dilatation (Normal <1.22):  1.03 Lung/Heart Ratio (Normal <0.45):  0.43 QGS EDV:  n/a ml QGS ESV:  n/a ml LV Ejection Fraction: Study not gated     Rest ECG: Atrial Fibrilliation LBBB  Stress ECG: No significant change from baseline ECG  QPS Raw Data Images:  Normal; no motion artifact; normal heart/lung ratio. Stress Images:  Very large and severe perfusion defect involves the entire apex and the entire mid-distal segemnts of the anterior, anteroseptal, inferoseptal and inferior left ventricular myocardium Rest Images:  Comparison with the stress images reveals no significant change. Subtraction (SDS):  There is a fixed defect that is most consistent with a previous infarction.  Impression Exercise Capacity:  Lexiscan with no exercise. BP Response:  Normal blood pressure response. Clinical Symptoms:  No significant symptoms noted. ECG Impression:  Baseline:  LBBB.  EKG uninterpretable due to LBBB at rest and stress. Comparison with Prior Nuclear Study: No significant change from previous study  Overall Impression:  Low risk stress nuclear study. Extensive scar in the entire LAD and RCA territory, with minimal border zone ischemia  LV Wall Motion:  non-gated study   [name] Senegal, MD  06/02/2012 5:48 PM

## 2012-06-17 ENCOUNTER — Other Ambulatory Visit (HOSPITAL_COMMUNITY): Payer: Self-pay | Admitting: Cardiovascular Disease

## 2012-06-17 DIAGNOSIS — I255 Ischemic cardiomyopathy: Secondary | ICD-10-CM

## 2012-06-18 ENCOUNTER — Ambulatory Visit: Payer: Medicare Other | Admitting: Internal Medicine

## 2012-07-07 ENCOUNTER — Ambulatory Visit (HOSPITAL_COMMUNITY): Payer: Medicare Other

## 2012-07-08 ENCOUNTER — Ambulatory Visit (HOSPITAL_COMMUNITY)
Admission: RE | Admit: 2012-07-08 | Discharge: 2012-07-08 | Disposition: A | Discharge status: Home / Self Care | Payer: Medicare Other | Source: Ambulatory Visit | Attending: Cardiovascular Disease | Admitting: Cardiovascular Disease

## 2012-07-08 DIAGNOSIS — I059 Rheumatic mitral valve disease, unspecified: Secondary | ICD-10-CM | POA: Insufficient documentation

## 2012-07-08 DIAGNOSIS — G4733 Obstructive sleep apnea (adult) (pediatric): Secondary | ICD-10-CM | POA: Insufficient documentation

## 2012-07-08 DIAGNOSIS — I255 Ischemic cardiomyopathy: Secondary | ICD-10-CM

## 2012-07-08 DIAGNOSIS — I2589 Other forms of chronic ischemic heart disease: Secondary | ICD-10-CM | POA: Insufficient documentation

## 2012-07-08 DIAGNOSIS — I079 Rheumatic tricuspid valve disease, unspecified: Secondary | ICD-10-CM | POA: Insufficient documentation

## 2012-07-08 DIAGNOSIS — E119 Type 2 diabetes mellitus without complications: Secondary | ICD-10-CM | POA: Insufficient documentation

## 2012-07-08 HISTORY — PX: US ECHOCARDIOGRAPHY: HXRAD669

## 2012-07-08 NOTE — Progress Notes (Signed)
[facility]   2D echo completed 07/08/2012.   Cindy Isami Mehra, RDCS  

## 2012-08-04 ENCOUNTER — Encounter (HOSPITAL_COMMUNITY): Payer: Self-pay | Admitting: Pharmacy Technician

## 2012-08-05 ENCOUNTER — Other Ambulatory Visit: Payer: Self-pay | Admitting: *Deleted

## 2012-08-05 DIAGNOSIS — I255 Ischemic cardiomyopathy: Secondary | ICD-10-CM

## 2012-08-06 HISTORY — PX: OTHER SURGICAL HISTORY: SHX169

## 2012-08-16 MED ORDER — GENTAMICIN SULFATE 40 MG/ML IJ SOLN
80.0000 mg | INTRAMUSCULAR | Status: DC
  Filled 2012-08-16: qty 2

## 2012-08-16 MED ORDER — CEFAZOLIN SODIUM-DEXTROSE 2-3 GM-% IV SOLR
2.0000 g | INTRAVENOUS | Status: DC
  Filled 2012-08-16 (×2): qty 50

## 2012-08-17 ENCOUNTER — Encounter (HOSPITAL_COMMUNITY): Payer: Self-pay | Admitting: General Practice

## 2012-08-17 ENCOUNTER — Encounter (HOSPITAL_COMMUNITY)
Admission: RE | Disposition: A | Discharge status: Home / Self Care | Payer: Self-pay | Source: Ambulatory Visit | Attending: Cardiovascular Disease

## 2012-08-17 ENCOUNTER — Ambulatory Visit (HOSPITAL_COMMUNITY)
Admission: RE | Admit: 2012-08-17 | Discharge: 2012-08-18 | Disposition: A | Discharge status: Home / Self Care | Payer: Medicare Other | Source: Ambulatory Visit | Attending: Cardiovascular Disease | Admitting: Cardiovascular Disease

## 2012-08-17 DIAGNOSIS — J449 Chronic obstructive pulmonary disease, unspecified: Secondary | ICD-10-CM | POA: Diagnosis present

## 2012-08-17 DIAGNOSIS — I4891 Unspecified atrial fibrillation: Secondary | ICD-10-CM | POA: Insufficient documentation

## 2012-08-17 DIAGNOSIS — I2589 Other forms of chronic ischemic heart disease: Secondary | ICD-10-CM | POA: Insufficient documentation

## 2012-08-17 DIAGNOSIS — I252 Old myocardial infarction: Secondary | ICD-10-CM | POA: Insufficient documentation

## 2012-08-17 DIAGNOSIS — E119 Type 2 diabetes mellitus without complications: Secondary | ICD-10-CM | POA: Diagnosis present

## 2012-08-17 DIAGNOSIS — I428 Other cardiomyopathies: Secondary | ICD-10-CM | POA: Diagnosis present

## 2012-08-17 DIAGNOSIS — I509 Heart failure, unspecified: Secondary | ICD-10-CM | POA: Insufficient documentation

## 2012-08-17 DIAGNOSIS — I4821 Permanent atrial fibrillation: Secondary | ICD-10-CM | POA: Diagnosis present

## 2012-08-17 DIAGNOSIS — Z9189 Other specified personal risk factors, not elsewhere classified: Secondary | ICD-10-CM | POA: Diagnosis present

## 2012-08-17 DIAGNOSIS — I255 Ischemic cardiomyopathy: Secondary | ICD-10-CM

## 2012-08-17 DIAGNOSIS — Z95810 Presence of automatic (implantable) cardiac defibrillator: Secondary | ICD-10-CM | POA: Diagnosis not present

## 2012-08-17 DIAGNOSIS — I2510 Atherosclerotic heart disease of native coronary artery without angina pectoris: Secondary | ICD-10-CM | POA: Diagnosis present

## 2012-08-17 DIAGNOSIS — J4489 Other specified chronic obstructive pulmonary disease: Secondary | ICD-10-CM | POA: Insufficient documentation

## 2012-08-17 DIAGNOSIS — I4892 Unspecified atrial flutter: Secondary | ICD-10-CM | POA: Insufficient documentation

## 2012-08-17 DIAGNOSIS — Z7901 Long term (current) use of anticoagulants: Secondary | ICD-10-CM

## 2012-08-17 HISTORY — DX: Unspecified atrial flutter: I48.92

## 2012-08-17 HISTORY — PX: IMPLANTABLE CARDIOVERTER DEFIBRILLATOR IMPLANT: SHX5473

## 2012-08-17 HISTORY — DX: Other specified personal risk factors, not elsewhere classified: Z91.89

## 2012-08-17 HISTORY — DX: Type 2 diabetes mellitus without complications: E11.9

## 2012-08-17 HISTORY — DX: Calculus of kidney: N20.0

## 2012-08-17 HISTORY — DX: Presence of automatic (implantable) cardiac defibrillator: Z95.810

## 2012-08-17 HISTORY — DX: Ischemic cardiomyopathy: I25.5

## 2012-08-17 HISTORY — PX: CARDIAC DEFIBRILLATOR PLACEMENT: SHX171

## 2012-08-17 HISTORY — DX: Other cardiomyopathies: I42.8

## 2012-08-17 HISTORY — DX: Permanent atrial fibrillation: I48.21

## 2012-08-17 LAB — GLUCOSE, CAPILLARY
Glucose-Capillary: 157 mg/dL — ABNORMAL HIGH (ref 70–99)
Glucose-Capillary: 141 mg/dL — ABNORMAL HIGH (ref 70–99)

## 2012-08-17 LAB — SURGICAL PCR SCREEN
MRSA, PCR: NEGATIVE
Staphylococcus aureus: POSITIVE — AB

## 2012-08-17 SURGERY — IMPLANTABLE CARDIOVERTER DEFIBRILLATOR IMPLANT
Anesthesia: LOCAL

## 2012-08-17 MED ORDER — POTASSIUM CHLORIDE CRYS ER 20 MEQ PO TBCR
20.0000 meq | EXTENDED_RELEASE_TABLET | ORAL | Status: DC
  Administered 2012-08-17: 20 meq via ORAL
  Filled 2012-08-17 (×2): qty 1

## 2012-08-17 MED ORDER — ONDANSETRON HCL 4 MG/2ML IJ SOLN: 4.0000 mg | Freq: Four times a day (QID) | INTRAMUSCULAR | Status: DC | PRN

## 2012-08-17 MED ORDER — HEPARIN (PORCINE) IN NACL 2-0.9 UNIT/ML-% IJ SOLN
INTRAMUSCULAR | Status: AC
  Filled 2012-08-17: qty 500

## 2012-08-17 MED ORDER — METFORMIN HCL 500 MG PO TABS
500.0000 mg | ORAL_TABLET | Freq: Two times a day (BID) | ORAL | Status: DC
  Administered 2012-08-17 – 2012-08-18 (×2): 500 mg via ORAL
  Filled 2012-08-17 (×4): qty 1

## 2012-08-17 MED ORDER — DIGOXIN 250 MCG PO TABS
0.2500 mg | ORAL_TABLET | Freq: Every day | ORAL | Status: DC
  Administered 2012-08-18: 0.25 mg via ORAL
  Filled 2012-08-17: qty 1

## 2012-08-17 MED ORDER — CHLORHEXIDINE GLUCONATE 4 % EX LIQD
60.0000 mL | Freq: Once | CUTANEOUS | Status: DC
  Filled 2012-08-17: qty 60

## 2012-08-17 MED ORDER — ASPIRIN 81 MG PO CHEW
81.0000 mg | CHEWABLE_TABLET | Freq: Every day | ORAL | Status: DC
Start: 2012-08-17 — End: 2012-08-18
  Filled 2012-08-17: qty 1

## 2012-08-17 MED ORDER — FENTANYL CITRATE 0.05 MG/ML IJ SOLN
INTRAMUSCULAR | Status: AC
  Filled 2012-08-17: qty 2

## 2012-08-17 MED ORDER — SODIUM CHLORIDE 0.9 % IV SOLN
INTRAVENOUS | Status: DC
  Administered 2012-08-17: 07:00:00 via INTRAVENOUS

## 2012-08-17 MED ORDER — WARFARIN SODIUM 2 MG PO TABS
2.0000 mg | ORAL_TABLET | ORAL | Status: DC
  Filled 2012-08-17: qty 1

## 2012-08-17 MED ORDER — MAGNESIUM OXIDE 400 (241.3 MG) MG PO TABS
400.0000 mg | ORAL_TABLET | Freq: Two times a day (BID) | ORAL | Status: DC
Start: 2012-08-17 — End: 2012-08-18
  Administered 2012-08-17 – 2012-08-18 (×3): 400 mg via ORAL
  Filled 2012-08-17 (×4): qty 1

## 2012-08-17 MED ORDER — MUPIROCIN 2 % EX OINT
TOPICAL_OINTMENT | CUTANEOUS | Status: AC
  Filled 2012-08-17: qty 44

## 2012-08-17 MED ORDER — CEFAZOLIN SODIUM 1-5 GM-% IV SOLN
1.0000 g | Freq: Four times a day (QID) | INTRAVENOUS | Status: AC
  Administered 2012-08-17 – 2012-08-18 (×3): 1 g via INTRAVENOUS
  Filled 2012-08-17 (×3): qty 50

## 2012-08-17 MED ORDER — FUROSEMIDE 40 MG PO TABS
40.0000 mg | ORAL_TABLET | ORAL | Status: DC
  Administered 2012-08-17: 40 mg via ORAL
  Filled 2012-08-17: qty 1

## 2012-08-17 MED ORDER — SODIUM CHLORIDE 0.9 % IV SOLN: INTRAVENOUS | Status: AC

## 2012-08-17 MED ORDER — INSULIN ASPART 100 UNIT/ML ~~LOC~~ SOLN: 0.0000 [IU] | Freq: Three times a day (TID) | SUBCUTANEOUS | Status: DC

## 2012-08-17 MED ORDER — WARFARIN SODIUM 2 MG PO TABS: 2.0000 mg | ORAL_TABLET | ORAL | Status: DC

## 2012-08-17 MED ORDER — WARFARIN - PHYSICIAN DOSING INPATIENT: Freq: Every day | Status: DC

## 2012-08-17 MED ORDER — MIDAZOLAM HCL 5 MG/5ML IJ SOLN
INTRAMUSCULAR | Status: AC
  Filled 2012-08-17: qty 5

## 2012-08-17 MED ORDER — TAMSULOSIN HCL 0.4 MG PO CAPS
0.4000 mg | ORAL_CAPSULE | Freq: Every day | ORAL | Status: DC
  Administered 2012-08-18: 0.4 mg via ORAL
  Filled 2012-08-17 (×2): qty 1

## 2012-08-17 MED ORDER — MUPIROCIN 2 % EX OINT
TOPICAL_OINTMENT | Freq: Two times a day (BID) | CUTANEOUS | Status: DC
  Administered 2012-08-17: 22:00:00 via NASAL
  Administered 2012-08-17: 1 via NASAL
  Filled 2012-08-17 (×2): qty 22

## 2012-08-17 MED ORDER — LIDOCAINE HCL (PF) 1 % IJ SOLN
INTRAMUSCULAR | Status: AC
Start: 2012-08-17 — End: 2012-08-17
  Filled 2012-08-17: qty 60

## 2012-08-17 MED ORDER — WARFARIN SODIUM 2 MG PO TABS: 2.0000 mg | ORAL_TABLET | Freq: Every day | ORAL | Status: DC

## 2012-08-17 MED ORDER — WARFARIN SODIUM 4 MG PO TABS: 4.0000 mg | ORAL_TABLET | ORAL | Status: DC

## 2012-08-17 MED ORDER — ACETAMINOPHEN 325 MG PO TABS
325.0000 mg | ORAL_TABLET | ORAL | Status: DC | PRN
  Administered 2012-08-18: 650 mg via ORAL
  Filled 2012-08-17: qty 2

## 2012-08-17 MED ORDER — SODIUM CHLORIDE 0.9 % IJ SOLN: 3.0000 mL | INTRAMUSCULAR | Status: DC | PRN

## 2012-08-17 NOTE — CV Procedure (Signed)
[name] Male, 78 y.o., 1934/07/17  Location: [facility]  MRN: [id number]  CSN: [id number]  Admit Dt: 08/17/12   Procedure report  Procedure performed:  1. Implantation of new single chamber cardioverter defibrillator 2. Fluoroscopy 3. Moderate sedation 4. Initial lead and generator testing  Reason for procedure: Primary prevention of sudden cardiac death Ischemic cardiomyopathy, left ventricular ejection fraction less than 30%, Heart failure NYHA class II, on comprehensive medical therapy for over 90 days (MADIT-II) [No history of AFib, on warfarin anticoagulation]  Procedure performed by: Thurmon Fair, MD  Complications: None  Estimated blood loss: <10 mL  Medications administered during procedure: Ancef 2 g intravenously, Lidocaine 1% 30 mL locally,  Fentanyl 50 mcg intravenously Versed 3 mg intravenously  Device details:  Merchandiser, retail model E160 serial number [id number] Right ventricular lead Endotak Reliance Model [id number] serial number [id number]  Procedure details:  After the risks and benefits of the procedure were discussed the patient provided informed consent and was brought to the cardiac cath lab in the fasting state. The patient was prepped and draped in usual sterile fashion. Local anesthesia with 1% lidocaine was administered to to the left infraclavicular area. A 5-6 cm horizontal incision was made parallel with and 2-3 cm caudal to the left clavicle. Using electrocautery and blunt dissection a prepectoral pocket was created down to the level of the pectoralis major muscle fascia. The pocket was carefully inspected for hemostasis. An antibiotic-soaked sponge was placed in the pocket.  Under fluoroscopic guidance and using the modified Seldinger technique a single venipuncture was performed to access the left subclavian vein. No difficulty was encountered accessing the vein.  A J-tip guidewire was subsequently exchanged for a 9.5 [place]  safe sheath.  Under fluoroscopic guidance the ventricular lead was advanced to level of the mid to apical right ventricular septum and thet active-fixation helix was deployed. Prominent current of injury was seen. Satisfactory pacing and sensing parameters were recorded. There was no evidence of diaphragmatic stimulation at maximum device output. The safe sheath was peeled away and the lead was secured in place with 2-0 silk.  In similar fashion the right atrial lead was advanced to the level of the atrial appendage. The active-fixation helix was deployed. There was prominent current of injury. Satisfactory  pacing and sensing parameters were recorded. There was no evidence of diaphragmatic stimulation with pacing at maximum device output. The safe sheath was peeled away and the lead was secured in place with 2-0 silk.  The antibiotic-soaked sponge was removed from the pocket. The pocket was flushed with copious amounts of antibiotic solution. Reinspection showed excellent hemostasis.  The ventricular lead was connected to the generator and appropriate ventricular pacing was seen.  Repeat testing of the lead parameters via telemetry showed excellent values.  The entire system was then carefully inserted in the pocket with care been taking that the lead and device assumed a comfortable position without pressure on the incision. Great care was taken that the leads be located deep to the generator. The pocket was then closed in layers using 2 layers of 2-0 Vicryl and cutaneous staples, after which a sterile dressing was applied.  Defibrillation threshold testing was then performed. After adequate sedation was achieved, ventricular fibrillation was induced with a 1J shock on T method. There was appropriate sensing by the device. There was 1 dropout during the event with sensitivity at 0.6 mV. The arrhythmia was terminated by a single 14J shock. High voltage impedance during the shock  was 55. Retesting of the  leads confirmed normal function.  At the end of the procedure the following lead parameters were encountered:  sensed R waves 15.5 mV impedance 529 ohms threshold 0.8 V at 0.4 ms pulse width. high voltage impedance 55 ohm charge time 2.6s.  Thurmon Fair, MD, [facility] [phone] office [phone] pager

## 2012-08-17 NOTE — H&P (Signed)
Date of Initial H&P: 07/30/2012  History reviewed, patient examined, no change in status, stable for surgery. Primary prevention ICD implantation for ischemic cardiomyopathy (Prior myocardial infarction, left ventricular ejection fraction under 35%, heart failure NYHA class II-III, on comprehensive medical therapy). This procedure has been fully reviewed with the patient and written informed consent has been obtained.  Thurmon Fair, MD, [facility] [phone] office [phone] pager

## 2012-08-17 NOTE — Progress Notes (Addendum)
ICD Criteria  Current LVEF (within 6 months):25-30%  NYHA Functional Classification: Class II  Heart Failure History:  Yes, Duration of heart failure since onset is > 9 months  Non-Ischemic Dilated Cardiomyopathy History:  No.  Atrial Fibrillation/Atrial Flutter:  Yes, A-Fib/A-Flutter type: Permanent (>1 year).  Ventricular Tachycardia History:  No.  Cardiac Arrest History:  No  History of Syndromes with Risk of Sudden Death:  No.  Previous ICD:  No.  Electrophysiology Study: No.  Anticoagulation Therapy:  Patient is on anticoagulation therapy, anticoagulation was NOT held prior to procedure.   Beta Blocker Therapy:  Yes.   Ace Inhibitor/ARB Therapy:  No, Reason not on Ace Inhibitor/ARB therapy:  hypotension

## 2012-08-18 ENCOUNTER — Encounter (HOSPITAL_COMMUNITY): Payer: Self-pay | Admitting: Cardiology

## 2012-08-18 ENCOUNTER — Ambulatory Visit (HOSPITAL_COMMUNITY): Payer: Medicare Other

## 2012-08-18 DIAGNOSIS — Z95810 Presence of automatic (implantable) cardiac defibrillator: Secondary | ICD-10-CM

## 2012-08-18 DIAGNOSIS — Z9189 Other specified personal risk factors, not elsewhere classified: Secondary | ICD-10-CM

## 2012-08-18 DIAGNOSIS — I428 Other cardiomyopathies: Secondary | ICD-10-CM

## 2012-08-18 HISTORY — DX: Presence of automatic (implantable) cardiac defibrillator: Z95.810

## 2012-08-18 HISTORY — DX: Other cardiomyopathies: I42.8

## 2012-08-18 HISTORY — DX: Other specified personal risk factors, not elsewhere classified: Z91.89

## 2012-08-18 LAB — GLUCOSE, CAPILLARY

## 2012-08-18 NOTE — Discharge Summary (Signed)
Physician Discharge Summary  Patient ID: [name] MRN: [id number] DOB/AGE: 1934-08-09 78 y.o.  Admit date: 08/17/2012 Discharge date: 08/18/2012  Discharge Diagnoses:  Principal Problem:   NICM (nonischemic cardiomyopathy), EF 25-30% Active Problems:   At risk for sudden cardiac death   Permanent atrial fibrillation   Chronic anticoagulation, coumadin   DIABETES MELLITUS, TYPE II   Arteriosclerotic cardiovascular disease (ASCVD)   COPD (chronic obstructive pulmonary disease)   S/P ICD (internal cardiac defibrillator) procedure, 08/17/12, [employer] implanted   Discharged Condition: good  Procedures: Dr. Royann [name] implanted ICD: 08/17/12  Device details:  [employer] [place] model E160 serial number [id number]  Right ventricular lead Endotak Reliance Model 0292 serial number [id number]   ICD interrogation 08/18/12 23mv Impedence 550 Threshold 0.8v @ 4ms   Hospital Course: 78 year old WM with hx. Of ischemic CM with systolic dysfunction, chronic atrial fib (coumadin dosing by Dr.Don Cecelia [name]), CAD, with recent history of traumatic subarachnoid hemorrhage after a fall in November 2013. Coumadin was held temporarily but restarted by Dr.DonDiego in Dec.  He was electively scheduled for ICD by Dr. Royann [name] for primary prevention of sudden cardiac death due to ischemic cardiomyopathy, left ventricular ejection fraction less than 30%, Heart failure NYHA class II, on comprehensive medical therapy for over 90 days (MADIT-II).     Pt. tolerated the procedure without complications.  By the next AM he was stable, ambulating without complications.  CXR was without complications. He was seen by Dr. Royann [name] and found stable for discharge.  Dr. Royann [name] discussed discharge instructions at length with the patient.  He will follow up for wound check in 1 week.      Consults: None  Significant Diagnostic Studies:   Clinical Data: Defibrillator implantation 08/18/12.  CHEST - 2  VIEW  Comparison: 04/03/2012  Findings: Single lead defibrillator has been inserted. Borderline cardiomegaly. Pulmonary vascularity is normal and the lungs are clear. No pneumothorax. No acute osseous abnormality.  IMPRESSION: No acute abnormality. Defibrillator in place.    Discharge Exam: Blood pressure 112/51, pulse 74, temperature 97.6 F (36.4 C), temperature source Oral, resp. rate 18, height 5\' 2"  (1.575 m), weight 180 lb (81.647 kg), SpO2 100.00%.   AM exam: PE: General:alert and oriented, no complaints, Pacer site without hematoma, old blood on dressing  Heart:S1S2 RRR  Lungs:clear without rales, rhonchi or wheezes  Abd:+ BS, soft non tender  Ext:no edema  Disposition: 06-Home-Health Care Svc   Future Appointments Provider Department Dept Phone   08/27/2012 3:20 PM [name] Boozer, NP [facility] [place] [phone]       Medication List    TAKE these medications       aspirin 81 MG chewable tablet  Chew 1 tablet (81 mg total) by mouth daily.     digoxin 0.25 MG tablet  Commonly known as:  LANOXIN  Take 1 tablet (0.25 mg total) by mouth daily.     FISH OIL BURP-LESS 1000 MG Caps  Take 2,000 mg by mouth daily.     furosemide 40 MG tablet  Commonly known as:  LASIX  Take 1 tablet (40 mg total) by mouth every other day.     magnesium oxide 400 MG tablet  Commonly known as:  MAG-OX  Take 400 mg by mouth 2 (two) times daily.     metFORMIN 500 MG tablet  Commonly known as:  GLUCOPHAGE  Take 500 mg by mouth 2 (two) times daily with a meal.     oxycodone  5 MG capsule  Commonly known as:  OXY-IR  Take 5 mg by mouth every 4 (four) hours as needed for pain.     potassium chloride SA 20 MEQ tablet  Commonly known as:  K-DUR,KLOR-CON  Take 1 tablet (20 mEq total) by mouth every other day.     pravastatin 40 MG tablet  Commonly known as:  PRAVACHOL  Take 40 mg by mouth daily.     tamsulosin 0.4 MG Caps  Commonly known as:   FLOMAX  Take 0.4 mg by mouth daily after breakfast.     warfarin 4 MG tablet  Commonly known as:  COUMADIN  Take 2-4 mg by mouth daily. Takes 1 tab daily, then 1/2 tablet  For 2 days           Follow-up Information   Follow up with [facility] R, NP On 08/27/2012. (at  3:20 pm  for staple removal.)    Contact information:   [address] [place] [address] [phone]     Discharge Instructions: see ICD discharge instructions.  Signed: Leone [name] Nurse Practitioner-Certified [facility] 08/18/2012, 11:43 AM  Time spent on discharge :40 minutes.

## 2012-08-18 NOTE — Progress Notes (Signed)
Agree with Corliss Blacker note. I have examined the patient, reviewed the device interrogation and chest xray (which show favorable findings). Site appears healthy.  Discharge instructions including wound care, activity restrictions, follow-up and "shock plan" were discussed in detail. Thurmon Fair, MD, [facility] [phone] office [phone] pager

## 2012-08-18 NOTE — Progress Notes (Signed)
Subjective: No complaints  Objective: Vital signs in last 24 hours: Temp:  [97.6 F (36.4 C)-97.7 F (36.5 C)] 97.6 F (36.4 C) (05/13 0655) Pulse Rate:  [50-90] 74 (05/13 1002) Resp:  [18-20] 18 (05/13 0655) BP: (105-126)/(38-89) 112/51 mmHg (05/13 0655) SpO2:  [100 %] 100 % (05/13 0655) Weight change:  Last BM Date: 08/17/12 Intake/Output from previous day: +360 05/12 0701 - 05/13 0700 In: 360 [P.O.:360] Out: -  Intake/Output this shift: Total I/O In: 360 [P.O.:360] Out: -   PE: General:alert and oriented, no complaints,  Pacer site without hematoma, old blood on dressing  Heart:S1S2 RRR Lungs:clear without rales, rhonchi or wheezes Abd:+ BS, soft non tender Ext:no edema   Lab Results  Component Value Date   CHOL 117 02/15/2011   HDL 38* 02/15/2011   LDLCALC 48 02/15/2011   TRIG 155* 02/15/2011   CHOLHDL 3.1 02/15/2011   Lab Results  Component Value Date   HGBA1C 8.1* 02/28/2012     Lab Results  Component Value Date   TSH 1.367 03/24/2012      Studies/Results: Dg Chest 2 View  08/18/2012  *RADIOLOGY REPORT*  Clinical Data: Defibrillator implantation.  CHEST - 2 VIEW  Comparison: 04/03/2012  Findings: Single lead defibrillator has been inserted.  Borderline cardiomegaly.  Pulmonary vascularity is normal and the lungs are clear.  No pneumothorax.  No acute osseous abnormality.  IMPRESSION: No acute abnormality.  Defibrillator in place.   Original Report Authenticated By: Francene Boyers, M.D.    ICD: 08/17/12 Device details:  [employer] [place] model E160 serial number [id number]  Right ventricular lead Endotak Reliance Model [id number] serial number [id number]   ICD interrogation 23mv  Impedence 550  Threshold 0.8v @ 4ms  Medications: I have reviewed the patient's current medications. Scheduled Meds: . aspirin  81 mg Oral Daily  . digoxin  0.25 mg Oral Daily  . furosemide  40 mg Oral QODAY  . insulin aspart  0-9 Units Subcutaneous TID WC  . magnesium oxide   400 mg Oral BID  . metFORMIN  500 mg Oral BID WC  . mupirocin ointment   Nasal BID  . potassium chloride SA  20 mEq Oral QODAY  . tamsulosin  0.4 mg Oral QPC breakfast  . [START ON 08/20/2012] warfarin  2 mg Oral Q3 days  . warfarin  2 mg Oral Q3 days  . [START ON 08/19/2012] warfarin  4 mg Oral Q3 days  . Warfarin - Physician Dosing Inpatient   Does not apply q1800   Continuous Infusions:  PRN Meds:.acetaminophen, ondansetron (ZOFRAN) IV  Assessment/Plan: Principal Problem:   NICM (nonischemic cardiomyopathy), EF 25-30% Active Problems:   At risk for sudden cardiac death   Permanent atrial fibrillation   Chronic anticoagulation, coumadin   DIABETES MELLITUS, TYPE II   Arteriosclerotic cardiovascular disease (ASCVD)   COPD (chronic obstructive pulmonary disease)   S/P ICD (internal cardiac defibrillator) procedure, 08/17/12, [employer] implanted  PLAN:  Ambulate and if stable discharge home.    LOS: 1 day    [facility] R  Nurse Practitioner Certified Pager [phone] 08/18/2012, 10:50 AM

## 2012-08-18 NOTE — Progress Notes (Signed)
Pt discharged to home per MD order. Pt received and reviewed all discharge instructions and medication information including follow-up appointments and prescriptions. Pt also received and reviewed heart failure education.  Pt verbalized understanding.   Pt alert and oriented at discharge with no complaints of pain. Pt escorted to private vehicle via wheelchair by guest services. Efraim Kaufmann

## 2012-08-22 ENCOUNTER — Encounter: Payer: Self-pay | Admitting: *Deleted

## 2012-08-27 ENCOUNTER — Ambulatory Visit (INDEPENDENT_AMBULATORY_CARE_PROVIDER_SITE_OTHER): Payer: Medicare Other | Admitting: Cardiology

## 2012-08-27 ENCOUNTER — Encounter: Payer: Self-pay | Admitting: Cardiology

## 2012-08-27 ENCOUNTER — Encounter: Payer: Self-pay | Admitting: Cardiovascular Disease

## 2012-08-27 VITALS — BP 102/68 | HR 76 | Wt 182.1 lb

## 2012-08-27 DIAGNOSIS — Z7901 Long term (current) use of anticoagulants: Secondary | ICD-10-CM

## 2012-08-27 DIAGNOSIS — I428 Other cardiomyopathies: Secondary | ICD-10-CM

## 2012-08-27 DIAGNOSIS — I4891 Unspecified atrial fibrillation: Secondary | ICD-10-CM

## 2012-08-27 DIAGNOSIS — Z9189 Other specified personal risk factors, not elsewhere classified: Secondary | ICD-10-CM

## 2012-08-27 DIAGNOSIS — Z95810 Presence of automatic (implantable) cardiac defibrillator: Secondary | ICD-10-CM

## 2012-08-27 LAB — POCT INR: INR: 1.5

## 2012-08-27 NOTE — Patient Instructions (Signed)
Wear sling only to remind you not to raise arm. OK to drive. Follow up with Dr. Royann Shivers in 4 weeks for ICD interrogation

## 2012-08-27 NOTE — Assessment & Plan Note (Signed)
S/p ICD palcement, site healing well staples removed.  OK to drive.  Wear sling just to remind you not to raise arm above your head.  Follow up with Dr. Royann Shivers for ICD interrogation in 4 weeks.

## 2012-08-27 NOTE — Assessment & Plan Note (Signed)
INR today stable

## 2012-08-27 NOTE — Progress Notes (Signed)
[facility]  08/27/2012   PCP: [name], MD   Chief Complaint  Patient presents with  . Follow-up    remove staples from ICD.    Primary Cardiologist: [name] ICD: Dr. Royann Shivers  HPI: 78 year old WM with hx. Of ischemic CM with systolic dysfunction, chronic atrial fib (coumadin dosing by Dr.Don Cecelia Byars), CAD, with recent history of traumatic subarachnoid hemorrhage after a fall in November 2013. Coumadin was held temporarily but restarted by Dr.DonDiego in Dec. He was electively scheduled for ICD by Dr. Royann Shivers for primary prevention of sudden cardiac death due to ischemic cardiomyopathy, left ventricular ejection fraction less than 30%, Heart failure NYHA class II, on comprehensive medical therapy for over 90 days (MADIT-II). Pt did well with the procedure and post op and was discharged the next AM.  Here today for staple removal.  No complaints of chest pain or SOB.  We discussed movement of the lt arm and site care.  Staples removed without complications. Site without swelling or redness.  INR was checked. He will continue to follow with PCP for coumadin checks.   Allergies  Allergen Reactions  . Lipitor (Atorvastatin) Other (See Comments)    myalgias   . Procaine Hcl Nausea And Vomiting  . Tramadol Nausea And Vomiting    Current Outpatient Prescriptions  Medication Sig Dispense Refill  . aspirin 81 MG chewable tablet Chew 1 tablet (81 mg total) by mouth daily.  30 tablet  6  . carvedilol (COREG) 12.5 MG tablet Take 12.5 mg by mouth 2 (two) times daily with a meal.      . digoxin (LANOXIN) 0.25 MG tablet Take 1 tablet (0.25 mg total) by mouth daily.  30 tablet  3  . furosemide (LASIX) 40 MG tablet Take 40 mg by mouth every other day.       . magnesium oxide (MAG-OX) 400 MG tablet Take 400 mg by mouth 2 (two) times daily.        . metFORMIN (GLUCOPHAGE) 500 MG tablet Take 500 mg by mouth 2 (two) times daily with a meal.       .  Omega-3 Fatty Acids (FISH OIL BURP-LESS) 1000 MG CAPS Take 2,000 mg by mouth daily.       Mar[place] Kitchen oxycodone (OXY-IR) 5 MG capsule Take 5 mg by mouth every 4 (four) hours as needed for pain.       . potassium chloride SA (K-DUR,KLOR-CON) 20 MEQ tablet Take 1 tablet (20 mEq total) by mouth every other day.  90 tablet  1  . pravastatin (PRAVACHOL) 40 MG tablet Take 40 mg by mouth daily.      . Tamsulosin HCl (FLOMAX) 0.4 MG CAPS Take 0.4 mg by mouth daily after breakfast.      . warfarin (COUMADIN) 4 MG tablet Take 2-4 mg by mouth daily. Takes 1 tab daily, then 1/2 tablet  For 2 days       No current facility-administered medications for this visit.    Past Medical History  Diagnosis Date  . Arteriosclerotic cardiovascular disease (ASCVD)   . COPD (chronic obstructive pulmonary disease)   . Gout   . DJD (degenerative joint disease)   . Hyperlipidemia   . Chronic anticoagulation 2012    2012  . CHF (congestive heart failure)   . Atrial fibrillation     Onset in 2012  . Atrial flutter   . ICD (implantable cardiac defibrillator) in place   . Myocardial infarction 1998  .  Ischemic cardiomyopathy   . Obstructive sleep apnea     "went away when I lost a bunch of weight" (08/17/2012)  . Type II diabetes mellitus   . Kidney stone     "just once" (08/17/2012)  . NICM (nonischemic cardiomyopathy), EF 25-30% 19-Aug-2012  . At risk for sudden cardiac death 08-19-2012  . Permanent atrial fibrillation 02/15/2011    Initial onset in 03/2011 with rapid ventricular response   . S/P ICD (internal cardiac defibrillator) procedure, 08/17/12, [employer] 08-19-12    [employer]    Past Surgical History  Procedure Laterality Date  . Cholecystectomy  2009  . Knee arthroplasty Left 1978    "tendon & cartilege repair" (08/17/2012)  . Vasectomy  ~ 1964  . Cystoscopy/retrograde/ureteroscopy  06/28/2011    Procedure: CYSTOSCOPY/RETROGRADE/URETEROSCOPY;  Surgeon: Ky Barban, MD;  Location: [facility];   Service: Urology;  Laterality: Right;  . Stone extraction with basket  06/28/2011    Procedure: STONE EXTRACTION WITH BASKET;  Surgeon: Ky Barban, MD;  Location: [facility];  Service: Urology;  Laterality: Right;  specimen given to family per MD  . Cardiac defibrillator placement  08/17/2012    [employer]  . Coronary angioplasty with stent placement  07/14/1996    "1" (08/17/2012)  . Cataract extraction w/ intraocular lens  implant, bilateral Bilateral ~ 2011  . US echocardiography  07/08/2012    EF <20%,mild MR,TR,LA severely dilated  . Myoview perfusion scan  06/02/2012    low risk, extensive scar entire LAD & RCA territory  . S/p icd  08/2012    [employer]    GNF:AOZHYQM:VH colds or fevers, no weight changes CV:see HPI PUL:see HPI Neuro:no syncope, no lightheadedness Endo:stable diabetes, no thyroid disease PHYSICAL EXAM BP 102/68  Pulse 76  Wt 182 lb 1.6 oz (82.6 kg)  BMI 33.3 kg/m2 General:Pleasant affect, NAD Skin:Warm and dry, brisk capillary refill, pacer site with well approximated incision, staples removed no redness or swelling. Heart:S1S2 RRR without murmur, gallup, rub or click Lungs:clear without rales, rhonchi, or wheezes Neuro:alert and oriented, MAE, follows commands, + facial symmetry   ASSESSMENT AND PLAN At risk for sudden cardiac death S/p ICD palcement, site healing well staples removed.  OK to drive.  Wear sling just to remind you not to raise arm above your head.  Follow up with Dr. Royann Shivers for ICD interrogation in 4 weeks.  NICM (nonischemic cardiomyopathy), EF 25-30% S/p ICD, stable, no SOB currently.  S/P ICD (internal cardiac defibrillator) procedure, 08/17/12, [employer] implanted stable  Chronic anticoagulation, coumadin INR today stable

## 2012-08-27 NOTE — Assessment & Plan Note (Signed)
stable °

## 2012-08-27 NOTE — Assessment & Plan Note (Signed)
S/p ICD, stable, no SOB currently.

## 2012-09-07 ENCOUNTER — Telehealth: Payer: Self-pay | Admitting: Cardiovascular Disease

## 2012-09-07 NOTE — Telephone Encounter (Signed)
Returned call.  Pt stated he had a defibrillator put in on the 12th of last month.  Stated for the last week to week and half he has been feeling a pain above the site near his neck.  Denied CP or SOB.  Stated he was only rx'd tylenol for pain and he hasn't taken anything other than the two 500 mg tylenol the day of the procedure.  Consulted w/ S. Lelon Perla, CMA (Devices/Monitor) and advised pt come in for evaluation.  Also stated some patients are more sensitive to that area after placement.  Pt informed and offered to schedule appt w/ Dr. Salena Saner.  Pt stated he can't come in until the 19th bc that's when he gets paid.  Appt scheduled for 6.19.14 @ 11:45am w/ Dr. Salena Saner and pt agreed to call back w/ any changes.  He will continue to monitor pain.

## 2012-09-07 NOTE — Telephone Encounter (Signed)
Returning Amber call

## 2012-09-07 NOTE — Telephone Encounter (Signed)
Returned call.  Left message to call back before 4pm.  

## 2012-09-07 NOTE — Telephone Encounter (Signed)
Had defibrilator put in on 08-17-12-been having pain in his neck every since he had it-doesn't feel,sometimes he feels nauasated!Please call asap-very uncomfrortable!

## 2012-09-24 ENCOUNTER — Ambulatory Visit (INDEPENDENT_AMBULATORY_CARE_PROVIDER_SITE_OTHER): Payer: Medicare Other | Admitting: Cardiovascular Disease

## 2012-09-24 ENCOUNTER — Encounter: Payer: Self-pay | Admitting: Cardiovascular Disease

## 2012-09-24 ENCOUNTER — Other Ambulatory Visit: Payer: Self-pay | Admitting: Cardiovascular Disease

## 2012-09-24 VITALS — BP 116/68 | HR 88 | Ht 64.5 in | Wt 189.4 lb

## 2012-09-24 DIAGNOSIS — I5042 Chronic combined systolic (congestive) and diastolic (congestive) heart failure: Secondary | ICD-10-CM

## 2012-09-24 DIAGNOSIS — Z95810 Presence of automatic (implantable) cardiac defibrillator: Secondary | ICD-10-CM

## 2012-09-24 DIAGNOSIS — I4821 Permanent atrial fibrillation: Secondary | ICD-10-CM

## 2012-09-24 DIAGNOSIS — Z9189 Other specified personal risk factors, not elsewhere classified: Secondary | ICD-10-CM

## 2012-09-24 DIAGNOSIS — I428 Other cardiomyopathies: Secondary | ICD-10-CM

## 2012-09-24 DIAGNOSIS — Z789 Other specified health status: Secondary | ICD-10-CM

## 2012-09-24 DIAGNOSIS — I509 Heart failure, unspecified: Secondary | ICD-10-CM

## 2012-09-24 DIAGNOSIS — I4891 Unspecified atrial fibrillation: Secondary | ICD-10-CM

## 2012-09-24 NOTE — Progress Notes (Signed)
In clinic ICD interrogation. Normal device function. Minimal changes made in device settings.

## 2012-09-24 NOTE — Assessment & Plan Note (Signed)
Reinforced need for compliance of medications to avoid unnecessary shocks related to rapid ventricular response to atrial fibrillation. He is on appropriate anticoagulation therapy with warfarin

## 2012-09-24 NOTE — Assessment & Plan Note (Addendum)
[address] [employer] [place] model E 160, single-chamber, serial [id number] implanted 08/17/2012 (MADIT2 criteria, primary prevention). The surgical scar is almost completely sealed and healed. There is no tenderness redness swelling or discharge at the site. The device is indeed slightly closer to the clavicle but does not actually come into contact with it. I advise that he continue taking Tylenol as needed for pain. There is no evidence of infection that I can see.   Device outputs reduced a chronic levels.  Reviewed his "shock plan" also educated regarding remote monitoring. He will do a device validated for 3 months and I'll see him back in the clinic on a yearly basis. He'll otherwise follow with [name] for his coronary issues and congestive heart failure.

## 2012-09-24 NOTE — Progress Notes (Signed)
Patient ID: AMAHRI DENGEL, male   DOB: [birth year], 78 y.o.   MRN: [id number]      Reason for office visit Followup for congestive heart failure and recent defibrillator implantation  It is 1 month since Mr. Demarcus received his single-chamber defibrillator. He has not had any further discharges fever chills were signs of infection locally. He has not had any complaints of shortness of breath at rest or with exertion lower extremity edema with signs of volume retention. Interrogation of the pacemaker today shows excellent findings.  The right ventricular lead impedance is 552 ohms (high-voltage 69 ohms and (hours her rhythm 25 mV the threshold was 0.7 pulse at 0.4 ms pulse width r. There has been no evidence of high ventricular rates and ventricular pacing occurs only 4% of the time (background rhythm is atrial fibrillation); device outputs were lowered to chronic settings. He'll been enrolled in the [employer] remote device followup.  His family told me that he has been complaining of a lot of pain at the fibular site. I discussed with Mr. Longley he tells me that he has taken Tylenol on 2 occasions. Once in the hospital and once a couple of nights ago. He says that he took Tylenol he had no discomfort he slept "like a baby". Do not think he needs any stronger analgesics.  Allergies  Allergen Reactions  . Lipitor (Atorvastatin) Other (See Comments)    myalgias   . Procaine Hcl Nausea And Vomiting  . Tramadol Nausea And Vomiting    Current Outpatient Prescriptions  Medication Sig Dispense Refill  . aspirin 81 MG chewable tablet Chew 1 tablet (81 mg total) by mouth daily.  30 tablet  6  . carvedilol (COREG) 12.5 MG tablet Take 12.5 mg by mouth 2 (two) times daily with a meal.      . digoxin (LANOXIN) 0.25 MG tablet Take 1 tablet (0.25 mg total) by mouth daily.  30 tablet  3  . furosemide (LASIX) 40 MG tablet Take 40 mg by mouth every other day.       . magnesium oxide (MAG-OX) 400 MG tablet Take  400 mg by mouth 2 (two) times daily.        . metFORMIN (GLUCOPHAGE) 500 MG tablet Take 500 mg by mouth 2 (two) times daily with a meal.       . Omega-3 Fatty Acids (FISH OIL BURP-LESS) 1000 MG CAPS Take 2,000 mg by mouth daily.       . potassium chloride SA (K-DUR,KLOR-CON) 20 MEQ tablet Take 1 tablet (20 mEq total) by mouth every other day.  90 tablet  1  . pravastatin (PRAVACHOL) 40 MG tablet Take 40 mg by mouth daily.      . Tamsulosin HCl (FLOMAX) 0.4 MG CAPS Take 0.4 mg by mouth daily after breakfast.      . warfarin (COUMADIN) 4 MG tablet Takes 4 mg for two days, then 2 mg (1/2 tablet) one day.       No current facility-administered medications for this visit.    Past Medical History  Diagnosis Date  . Arteriosclerotic cardiovascular disease (ASCVD)   . COPD (chronic obstructive pulmonary disease)   . Gout   . DJD (degenerative joint disease)   . Hyperlipidemia   . Chronic anticoagulation 2012    2012  . CHF (congestive heart failure)   . Atrial fibrillation     Onset in 2012  . Atrial flutter   . ICD (implantable cardiac defibrillator) in place   .  Myocardial infarction 1998  . Ischemic cardiomyopathy   . Obstructive sleep apnea     "went away when I lost a bunch of weight" (08/17/2012)  . Type II diabetes mellitus   . Kidney stone     "just once" (08/17/2012)  . NICM (nonischemic cardiomyopathy), EF 25-30% 09/06/12  . At risk for sudden cardiac death 2012/09/06  . Permanent atrial fibrillation 02/15/2011    Initial onset in 03/2011 with rapid ventricular response   . S/P ICD (internal cardiac defibrillator) procedure, 08/17/12, [employer] 09-06-2012    [employer]    Past Surgical History  Procedure Laterality Date  . Cholecystectomy  2009  . Knee arthroplasty Left 1978    "tendon & cartilege repair" (08/17/2012)  . Vasectomy  ~ 1964  . Cystoscopy/retrograde/ureteroscopy  06/28/2011    Procedure: CYSTOSCOPY/RETROGRADE/URETEROSCOPY;  Surgeon: Ky Barban, MD;  Location: [facility];  Service: Urology;  Laterality: Right;  . Stone extraction with basket  06/28/2011    Procedure: STONE EXTRACTION WITH BASKET;  Surgeon: Ky Barban, MD;  Location: [facility];  Service: Urology;  Laterality: Right;  specimen given to family per MD  . Cardiac defibrillator placement  08/17/2012    [employer]  . Coronary angioplasty with stent placement  07/14/1996    "1" (08/17/2012)  . Cataract extraction w/ intraocular lens  implant, bilateral Bilateral ~ 2011  . US echocardiography  07/08/2012    EF <20%,mild MR,TR,LA severely dilated  . Myoview perfusion scan  06/02/2012    low risk, extensive scar entire LAD & RCA territory  . S/p icd  08/2012    [employer]    Family History  Problem Relation Age of Onset  . Heart failure Mother   . Heart failure Father     History   Social History  . Marital Status: Married    Spouse Name: N/A    Number of Children: N/A  . Years of Education: N/A   Occupational History  . Not on file.   Social History Main Topics  . Smoking status: Former Smoker -- 2.00 packs/day for 40 years    Types: Cigarettes    Quit date: 04/08/1992  . Smokeless tobacco: Former Neurosurgeon  . Alcohol Use: No     Comment: 08/17/2012 "quit drinking in 1983"  . Drug Use: No  . Sexually Active: No   Other Topics Concern  . Not on file   Social History Narrative  . No narrative on file    Review of systems:    PHYSICAL EXAM BP 116/68  Pulse 88  Ht 5' 4.5" (1.638 m)  Wt 189 lb 6.4 oz (85.911 kg)  BMI 32.02 kg/m2 General: Alert, oriented x3, no distress Head: no evidence of trauma, PERRL, EOMI, no exophtalmos or lid lag, no myxedema, no xanthelasma; normal ears, nose and oropharynx Neck: normal jugular venous pulsations and no hepatojugular reflux; brisk carotid pulses without delay and no carotid bruits Chest: clear to auscultation, no signs of consolidation by percussion or palpation, normal fremitus, symmetrical and full  respiratory excursions The defibrillator site appears healthy. The wound has healed almost completely. There is no evidence of swelling redness discharge or bleeding or bruising. The defibrillator does not appear to be in direct contact with the clavicle. Cardiovascular: Laterally displaced apical impulse, regular rhythm, normal first and second heart sounds, no murmurs, rubs, S4 present Abdomen: no tenderness or distention, no masses by palpation, no abnormal pulsatility or arterial bruits, normal bowel sounds, no hepatosplenomegaly Extremities: no  clubbing, cyanosis or edema; 2+ radial, ulnar and brachial pulses bilaterally; 2+ right femoral, posterior tibial and dorsalis pedis pulses; 2+ left femoral, posterior tibial and dorsalis pedis pulses; no subclavian or femoral bruits Neurological: grossly nonfocal   EKG: Background atrial fibrillation with controlled ventricular rate  Lipid Panel     Component Value Date/Time   CHOL 117 02/15/2011 0824   TRIG 155* 02/15/2011 0824   HDL 38* 02/15/2011 0824   CHOLHDL 3.1 02/15/2011 0824   VLDL 31 02/15/2011 0824   LDLCALC 48 02/15/2011 0824    BMET    Component Value Date/Time   NA 143 04/10/2012 1205   K 4.2 04/10/2012 1205   CL 99 04/10/2012 1205   CO2 33* 04/10/2012 1205   GLUCOSE 151* 04/10/2012 1205   BUN 12 04/10/2012 1205   CREATININE 1.07 04/10/2012 1205   CREATININE 1.05 03/26/2012 0515   CALCIUM 10.1 04/10/2012 1205   GFRNONAA 67* 03/26/2012 0515   GFRAA 78* 03/26/2012 0515     ASSESSMENT AND PLAN S/P ICD (internal cardiac defibrillator) procedure, 08/17/12, [employer] implanted [employer] model E 160, single-chamber, serial [id number] implanted 08/17/2012 (MADIT2 criteria, primary prevention). The surgical scar is almost completely sealed and healed. There is no tenderness redness swelling or discharge at the site. The device is indeed slightly closer to the clavicle but does not actually come into contact with it. I advise  that he continue taking Tylenol as needed for pain. There is no evidence of infection that I can see.   Device outputs reduced a chronic levels.  Reviewed his "shock plan" also educated regarding remote monitoring. He will do a device validated for 3 months and I'll see him back in the clinic on a yearly basis. He'll otherwise follow with [name] for his coronary issues and congestive heart failure.  Permanent atrial fibrillation Reinforced need for compliance of medications to avoid unnecessary shocks related to rapid ventricular response to atrial fibrillation. He is on appropriate anticoagulation therapy with warfarin  Chronic combined systolic and diastolic CHF, NYHA class 2 Ischemic cardiomyopathy; anterior MI in 1998 treated with stent to the LAD; PTCA of circumflex in 2004 and stent to the RCA; EF of 30-40% in 08/2008; EF of 25% on echocardiogram in 03/2011; currently appears clinically euvolemic.      Junious Silk, MD, [facility] [phone] office [phone] pager

## 2012-09-24 NOTE — Assessment & Plan Note (Signed)
Ischemic cardiomyopathy; anterior MI in 1998 treated with stent to the LAD; PTCA of circumflex in 2004 and stent to the RCA; EF of 30-40% in 08/2008; EF of 25% on echocardiogram in 03/2011; currently appears clinically euvolemic.

## 2012-09-24 NOTE — Patient Instructions (Addendum)
Remote monitoring is used to monitor your Pacemaker of ICD from home. This monitoring reduces the number of office visits required to check your device to one time per year. It allows [place] to keep an eye on the functioning of your device to ensure it is working properly. You are scheduled for a device check from home on September 21,2014. You may send your transmission at any time that day. If you have a wireless device, the transmission will be sent automatically. After your physician reviews your transmission, you will receive a postcard with your next transmission date.    Your physician recommends that you schedule a follow-up appointment in: One year

## 2012-09-25 LAB — ICD DEVICE OBSERVATION
DEVICE MODEL ICD: 106496
FVT: 0
MODE SWITCH EPISODES: 0
PACEART VT: 0
RV LEAD AMPLITUDE: 25 mv
RV LEAD THRESHOLD: 0.7 V

## 2012-11-30 ENCOUNTER — Telehealth: Payer: Self-pay | Admitting: Cardiovascular Disease

## 2012-11-30 NOTE — Telephone Encounter (Signed)
Please have Dr C to please call her when he comes in tomorrow morning-she does not want to talk to anybody but DR C.

## 2012-11-30 NOTE — Telephone Encounter (Signed)
Per daughter. Patient "reached to pull up covers and his defibrillator went up to his collarbone and it felt like muscles were tearing and was very painful." Daughter has to leave at 12:15 if she can get a call back prior to that!

## 2012-11-30 NOTE — Telephone Encounter (Signed)
Unlikely he will have damaged the ICD. He can do a remote download and we can check it. He cannot have an MRI. He can have a CT

## 2012-11-30 NOTE — Telephone Encounter (Signed)
Returned call and spoke w/ daughter.  Informed Dr. Salena Saner is not in the office.  Stated she knows.  Stated pt c/o  Pt stated he went to pull the covers up w/ his L hand and it felt like it pushed the defibrillator up about 2 inches.  Stated he moved it down and is fine now.  Pt informed Levora Angel, CMA w/ devices advised he should be seen to check out site.  Pt stated he is okay now and can't come in before Thursday.  Stated tomorrow is his wife's birthday and he already has an appt in [place] on Thursday.  Pt would like Dr. Salena Saner to be notified and informed he will be.  Pt verbalized understanding and agreed w/ plan.  Pt also c/o pain in the back of his head and a blue light in the L eye when he has this pain.  Pt stated Dr. Janna Arch told him he couldn't do an MRI or CT scan of his head b/c of his defibrillator.  Pt informed Dr. Salena Saner will be notified to find out if he can have either test.  Pt verbalized understanding and agreed w/ plan.  Message forwarded to Dr. Royann Shivers.

## 2012-12-11 NOTE — Telephone Encounter (Signed)
Pt would like for Dr. Salena Saner to call her back today. She is upset that he has not called her to tell her why the ICD is moving around in her father's chest. She wants to know why he is having a blue light spells and pain in his head. She wants to speak to Dr. Salena Saner not to a nurse.

## 2012-12-11 NOTE — Telephone Encounter (Signed)
Called, no answer, left message

## 2012-12-11 NOTE — Telephone Encounter (Signed)
Message forwarded to Dr. Croitoru.  

## 2012-12-24 ENCOUNTER — Emergency Department (HOSPITAL_COMMUNITY)
Admission: EM | Admit: 2012-12-24 | Discharge: 2012-12-25 | Disposition: A | Discharge status: Home / Self Care | Payer: Medicare Other | Attending: Emergency Medicine | Admitting: Emergency Medicine

## 2012-12-24 ENCOUNTER — Emergency Department (HOSPITAL_COMMUNITY): Payer: Medicare Other

## 2012-12-24 ENCOUNTER — Encounter (HOSPITAL_COMMUNITY): Payer: Self-pay

## 2012-12-24 DIAGNOSIS — I509 Heart failure, unspecified: Secondary | ICD-10-CM | POA: Insufficient documentation

## 2012-12-24 DIAGNOSIS — I4891 Unspecified atrial fibrillation: Secondary | ICD-10-CM | POA: Insufficient documentation

## 2012-12-24 DIAGNOSIS — M25019 Hemarthrosis, unspecified shoulder: Secondary | ICD-10-CM | POA: Insufficient documentation

## 2012-12-24 DIAGNOSIS — Z95810 Presence of automatic (implantable) cardiac defibrillator: Secondary | ICD-10-CM | POA: Insufficient documentation

## 2012-12-24 DIAGNOSIS — E785 Hyperlipidemia, unspecified: Secondary | ICD-10-CM | POA: Insufficient documentation

## 2012-12-24 DIAGNOSIS — M25011 Hemarthrosis, right shoulder: Secondary | ICD-10-CM

## 2012-12-24 DIAGNOSIS — J449 Chronic obstructive pulmonary disease, unspecified: Secondary | ICD-10-CM | POA: Insufficient documentation

## 2012-12-24 DIAGNOSIS — R110 Nausea: Secondary | ICD-10-CM | POA: Insufficient documentation

## 2012-12-24 DIAGNOSIS — Z8739 Personal history of other diseases of the musculoskeletal system and connective tissue: Secondary | ICD-10-CM | POA: Insufficient documentation

## 2012-12-24 DIAGNOSIS — Z862 Personal history of diseases of the blood and blood-forming organs and certain disorders involving the immune mechanism: Secondary | ICD-10-CM | POA: Insufficient documentation

## 2012-12-24 DIAGNOSIS — J4489 Other specified chronic obstructive pulmonary disease: Secondary | ICD-10-CM | POA: Insufficient documentation

## 2012-12-24 DIAGNOSIS — Z79899 Other long term (current) drug therapy: Secondary | ICD-10-CM | POA: Insufficient documentation

## 2012-12-24 DIAGNOSIS — I252 Old myocardial infarction: Secondary | ICD-10-CM | POA: Insufficient documentation

## 2012-12-24 DIAGNOSIS — Z8639 Personal history of other endocrine, nutritional and metabolic disease: Secondary | ICD-10-CM | POA: Insufficient documentation

## 2012-12-24 DIAGNOSIS — E119 Type 2 diabetes mellitus without complications: Secondary | ICD-10-CM | POA: Insufficient documentation

## 2012-12-24 DIAGNOSIS — Z87891 Personal history of nicotine dependence: Secondary | ICD-10-CM | POA: Insufficient documentation

## 2012-12-24 DIAGNOSIS — Z7982 Long term (current) use of aspirin: Secondary | ICD-10-CM | POA: Insufficient documentation

## 2012-12-24 DIAGNOSIS — Z87442 Personal history of urinary calculi: Secondary | ICD-10-CM | POA: Insufficient documentation

## 2012-12-24 NOTE — ED Notes (Signed)
Pt c/o pain to right shoulder since approx 7pm,.  Pt denies injury

## 2012-12-24 NOTE — ED Notes (Signed)
MD at bedside. 

## 2012-12-24 NOTE — ED Provider Notes (Signed)
CSN: [id number]     Arrival date & time 12/24/12  2234 History   This chart was scribed for Dione Booze, MD by Bennett Scrape, ED Scribe. This patient was seen in room [facility] and the patient's care was started at 11:26 PM.   Chief Complaint  Patient presents with  . Shoulder Pain    The history is provided by the patient. No language interpreter was used.    HPI Comments: [name] is a 78 y.o. male with a h/o ASCVD, COPD and DJD who presents to the Emergency Department complaining of constant right shoulder pain described as a "knot" that started suddenly around 6:30 PM (5 hours ago) this evening. He states that he was sitting at his kitchen table with both hands on his tables talking on the phone. He rates his pain an 11 out of 10 currently and lists nausea as an associated symptom. He admits that movement aggravates his pain and he denies any improving factors. He denies any recent injuries or falls as the trigger of his symptoms. Pt denies taking OTC medications at home to improve symptoms. He reports prior episodes with milder pain. He denies emesis. He reports having a h/o CHF with chronic lower leg swelling. He is currently on Lasix and denies any missed doses. He is also on coumadin currently. He is currently getting cortisone treatments in his knees at [facility] for his arthritis.   PCP is Dr. Delbert Harness  Past Medical History  Diagnosis Date  . Arteriosclerotic cardiovascular disease (ASCVD)   . COPD (chronic obstructive pulmonary disease)   . Gout   . DJD (degenerative joint disease)   . Hyperlipidemia   . Chronic anticoagulation 2012    2012  . CHF (congestive heart failure)   . Atrial fibrillation     Onset in 2012  . Atrial flutter   . ICD (implantable cardiac defibrillator) in place   . Myocardial infarction 1998  . Ischemic cardiomyopathy   . Obstructive sleep apnea     "went away when I lost a bunch of weight" (08/17/2012)  . Type II diabetes mellitus   .  Kidney stone     "just once" (08/17/2012)  . NICM (nonischemic cardiomyopathy), EF 25-30% 2012/09/05  . At risk for sudden cardiac death 09/05/2012  . Permanent atrial fibrillation 02/15/2011    Initial onset in 03/2011 with rapid ventricular response   . S/P ICD (internal cardiac defibrillator) procedure, 08/17/12, [employer] 05-Sep-2012    [employer]   Past Surgical History  Procedure Laterality Date  . Cholecystectomy  2009  . Knee arthroplasty Left 1978    "tendon & cartilege repair" (08/17/2012)  . Vasectomy  ~ 1964  . Cystoscopy/retrograde/ureteroscopy  06/28/2011    Procedure: CYSTOSCOPY/RETROGRADE/URETEROSCOPY;  Surgeon: Ky Barban, MD;  Location: [facility];  Service: Urology;  Laterality: Right;  . Stone extraction with basket  06/28/2011    Procedure: STONE EXTRACTION WITH BASKET;  Surgeon: Ky Barban, MD;  Location: [facility];  Service: Urology;  Laterality: Right;  specimen given to family per MD  . Cardiac defibrillator placement  08/17/2012    [employer]  . Coronary angioplasty with stent placement  07/14/1996    "1" (08/17/2012)  . Cataract extraction w/ intraocular lens  implant, bilateral Bilateral ~ 2011  . US echocardiography  07/08/2012    EF <20%,mild MR,TR,LA severely dilated  . Myoview perfusion scan  06/02/2012    low risk, extensive scar entire LAD & RCA territory  .  S/p icd  08/2012    [employer]   Family History  Problem Relation Age of Onset  . Heart failure Mother   . Heart failure Father    History  Substance Use Topics  . Smoking status: Former Smoker -- 2.00 packs/day for 40 years    Types: Cigarettes    Quit date: 04/08/1992  . Smokeless tobacco: Former Neurosurgeon  . Alcohol Use: No     Comment: 08/17/2012 "quit drinking in 1983"    Review of Systems  Gastrointestinal: Positive for nausea. Negative for vomiting.  Musculoskeletal: Positive for arthralgias. Negative for back pain.  All other systems reviewed and are  negative.    Allergies  Lipitor; Procaine hcl; and Tramadol  Home Medications   Current Outpatient Rx  Name  Route  Sig  Dispense  Refill  . aspirin 81 MG chewable tablet   Oral   Chew 1 tablet (81 mg total) by mouth daily.   30 tablet   6   . carvedilol (COREG) 12.5 MG tablet   Oral   Take 12.5 mg by mouth 2 (two) times daily with a meal.         . digoxin (LANOXIN) 0.25 MG tablet   Oral   Take 1 tablet (0.25 mg total) by mouth daily.   30 tablet   3   . furosemide (LASIX) 40 MG tablet   Oral   Take 40 mg by mouth every other day.          . metFORMIN (GLUCOPHAGE) 500 MG tablet   Oral   Take 500 mg by mouth 2 (two) times daily with a meal.          . Omega-3 Fatty Acids (FISH OIL BURP-LESS) 1000 MG CAPS   Oral   Take 2,000 mg by mouth daily.          . potassium chloride SA (K-DUR,KLOR-CON) 20 MEQ tablet   Oral   Take 1 tablet (20 mEq total) by mouth every other day.   90 tablet   1   . pravastatin (PRAVACHOL) 40 MG tablet   Oral   Take 40 mg by mouth daily.         . Tamsulosin HCl (FLOMAX) 0.4 MG CAPS   Oral   Take 0.4 mg by mouth daily after breakfast.         . warfarin (COUMADIN) 4 MG tablet      Takes 4 mg for two days, then 2 mg (1/2 tablet) one day.         . magnesium oxide (MAG-OX) 400 MG tablet   Oral   Take 400 mg by mouth 2 (two) times daily.            Triage Vitals: BP 132/82  Pulse 53  Temp(Src) 98.1 F (36.7 C) (Oral)  Resp 20  Ht 5' 4.5" (1.638 m)  Wt 183 lb (83.008 kg)  BMI 30.94 kg/m2  SpO2 98%  Physical Exam  Nursing note and vitals reviewed. Constitutional: He is oriented to person, place, and time. He appears well-developed and well-nourished. No distress.  HENT:  Head: Normocephalic and atraumatic.  Eyes: EOM are normal.  Neck: Neck supple. No tracheal deviation present.  Cardiovascular: Normal rate.  An irregular rhythm present.  Pulmonary/Chest: Effort normal and breath sounds normal. No  respiratory distress.  Musculoskeletal:  Mild swelling and diffuse tenderness of the right shoulder with pain on ROM, 2+ pitting edema of the bilateral lower extremities  Neurological: He is alert and oriented to person, place, and time.  Skin: Skin is warm and dry.  Psychiatric: He has a normal mood and affect. His behavior is normal.    ED Course  Procedures (including critical care time)  DIAGNOSTIC STUDIES: Oxygen Saturation is 98% on room air, normal by my interpretation.    COORDINATION OF CARE: 11:30 PM-Discussed treatment plan which includes x-ray of right shoulder with pt at bedside and pt agreed to plan.   Labs Review Labs Reviewed - No data to display Imaging Review Dg Shoulder Right  12/25/2012   CLINICAL DATA:  Right-sided shoulder pain. [place] No priors.  EXAM: RIGHT SHOULDER - 2+ VIEW  COMPARISON:  None.  FINDINGS: Three views of the right shoulder demonstrate no acute displaced fracture, subluxation or dislocation. Degenerative changes of osteoarthritis are noted at the right acromioclavicular joint.  IMPRESSION: 1. No acute radiographic abnormality of the right shoulder. 2. Osteoarthritis at the right acromioclavicular joint.   Electronically Signed   By: Trudie Reed M.D.   On: 12/25/2012 00:47   Images viewed by me.  MDM   1. Hemarthrosis of right shoulder    Right shoulder pain without trauma. Pain is worse with movement. Most likely cause is spontaneous hemorrhage into the joint, consider possibility of dislocation, pathologic fracture.  X-ray shows no fracture or dislocation. INR will be checked and he will need to be treated symptomatically. He is given a dose of oxycodone-acetaminophen.  I personally performed the services described in this documentation, which was scribed in my presence. The recorded information has been reviewed and is accurate.     Dione Booze, MD 12/25/12 [phone]

## 2012-12-25 LAB — PROTIME-INR: INR: 1.76 — ABNORMAL HIGH (ref 0.00–1.49)

## 2012-12-25 MED ORDER — OXYCODONE-ACETAMINOPHEN 5-325 MG PO TABS: 1.0000 | ORAL_TABLET | ORAL | Status: DC | PRN

## 2012-12-25 MED ORDER — OXYCODONE-ACETAMINOPHEN 5-325 MG PO TABS
1.0000 | ORAL_TABLET | Freq: Once | ORAL | Status: AC
  Administered 2012-12-25: 1 via ORAL
  Filled 2012-12-25: qty 1

## 2012-12-28 ENCOUNTER — Ambulatory Visit: Payer: Medicare Other

## 2012-12-28 DIAGNOSIS — I471 Supraventricular tachycardia: Secondary | ICD-10-CM

## 2012-12-28 DIAGNOSIS — I2589 Other forms of chronic ischemic heart disease: Secondary | ICD-10-CM

## 2012-12-28 DIAGNOSIS — I509 Heart failure, unspecified: Secondary | ICD-10-CM

## 2012-12-28 LAB — ICD DEVICE OBSERVATION

## 2013-01-01 MED FILL — Oxycodone w/ Acetaminophen Tab 5-325 MG: ORAL | Qty: 6 | Status: AC

## 2013-01-02 LAB — REMOTE ICD DEVICE
DEV-0020ICD: NEGATIVE
DEVICE MODEL ICD: 106496
FVT: 0
HV IMPEDENCE: 73 Ohm
MODE SWITCH EPISODES: 0
PACEART VT: 0
RV LEAD AMPLITUDE: 13.5 mv
RV LEAD IMPEDENCE ICD: 499 Ohm

## 2013-01-04 ENCOUNTER — Telehealth: Payer: Self-pay | Admitting: *Deleted

## 2013-01-04 ENCOUNTER — Telehealth: Payer: Self-pay | Admitting: Cardiovascular Disease

## 2013-01-04 NOTE — Telephone Encounter (Signed)
Error

## 2013-01-04 NOTE — Telephone Encounter (Signed)
Returned call.  Daughter stated pt with bleeding in shoulder and can't move.  Stated his defibrillator is up on top of his shoulder.  Stated pt also seen in ER where he was told he has bleeding in all of his joints.  Daughter sounds very anxious about pt and only wants pt to see Dr. Salena Saner.  Informed RN will look for a sooner appt, but if not available pt should see another provider tomorrow.  Daughter stated she doesn't want pt to see anyone except Dr. Salena Saner and she wants to be at the appt and can't tomorrow.  Daughter informed RN will call pt to discuss and call her back.  Verbalized understanding.  Call to pt.  Pt stated he knew his daughter called and she makes a big deal every time he coughs or sneezes.  Pt stated he wanted to keep the appt on Thursday w/ Dr. Salena Saner.  Pt declined offer for appt w/ Dr. Salena Saner tomorrow.  Pt also with c/o about Dr. Janna Arch and how he manages his coumadin.  Stated last Monday when he was seen he was told for the first time that he is supposed to come every month to have it checked.  Pt also stated he didn't have these problems w/ bleeding when he was on plavix.  Pt informed we have a pharmacist here who can manage his coumadin if he likes or pt can discuss other options w/ Dr. Salena Saner on Thursday.  Pt verbalized understanding and agreed w/ plan.  Call to daughter and informed pt decided to keep appt on Thursday w/ Dr. Salena Saner.  Also  Informed of pt's concern w/ coumadin mgmt and advised they discuss options at appt.  Verbalized understanding and agreed w/ plan.

## 2013-01-04 NOTE — Telephone Encounter (Signed)
dgt states father needs to be seen.  He is bleeding in his joints (?from coumadin?) and his defribrillator is sitting on his shoulder.  He doesn't have money to pay copay.  He can't sleep. She's worried.

## 2013-01-07 ENCOUNTER — Ambulatory Visit: Payer: Medicare Other | Admitting: Cardiovascular Disease

## 2013-01-18 ENCOUNTER — Encounter (HOSPITAL_BASED_OUTPATIENT_CLINIC_OR_DEPARTMENT_OTHER): Payer: Medicare Other

## 2013-03-22 ENCOUNTER — Ambulatory Visit: Payer: Medicare Other

## 2013-03-22 DIAGNOSIS — I428 Other cardiomyopathies: Secondary | ICD-10-CM

## 2013-03-24 ENCOUNTER — Ambulatory Visit (INDEPENDENT_AMBULATORY_CARE_PROVIDER_SITE_OTHER): Payer: Medicare Other | Admitting: Cardiovascular Disease

## 2013-03-24 VITALS — BP 132/80 | HR 68 | Ht 64.5 in | Wt 192.0 lb

## 2013-03-24 DIAGNOSIS — G4733 Obstructive sleep apnea (adult) (pediatric): Secondary | ICD-10-CM

## 2013-03-24 DIAGNOSIS — I4891 Unspecified atrial fibrillation: Secondary | ICD-10-CM

## 2013-03-24 DIAGNOSIS — I4821 Permanent atrial fibrillation: Secondary | ICD-10-CM

## 2013-03-24 DIAGNOSIS — I255 Ischemic cardiomyopathy: Secondary | ICD-10-CM

## 2013-03-24 DIAGNOSIS — Z95810 Presence of automatic (implantable) cardiac defibrillator: Secondary | ICD-10-CM

## 2013-03-24 DIAGNOSIS — Z7901 Long term (current) use of anticoagulants: Secondary | ICD-10-CM

## 2013-03-24 DIAGNOSIS — E119 Type 2 diabetes mellitus without complications: Secondary | ICD-10-CM

## 2013-03-24 DIAGNOSIS — I2589 Other forms of chronic ischemic heart disease: Secondary | ICD-10-CM

## 2013-03-24 NOTE — Patient Instructions (Addendum)
Your physician recommends that you schedule a follow-up appointment in: May or June 2015 with Dr. Royann Shivers, and August with [name].  Your physician has recommended you make the following change in your medication:take furosemide daily.

## 2013-03-26 ENCOUNTER — Encounter: Payer: Self-pay | Admitting: *Deleted

## 2013-03-26 ENCOUNTER — Encounter: Payer: Self-pay | Admitting: Cardiovascular Disease

## 2013-03-26 DIAGNOSIS — G4733 Obstructive sleep apnea (adult) (pediatric): Secondary | ICD-10-CM | POA: Insufficient documentation

## 2013-03-26 DIAGNOSIS — I255 Ischemic cardiomyopathy: Secondary | ICD-10-CM | POA: Insufficient documentation

## 2013-03-26 LAB — MDC_IDC_ENUM_SESS_TYPE_REMOTE
Battery Remaining Longevity: 12
Brady Statistic RV Percent Paced: 0 %
HighPow Impedance: 67 Ohm
Lead Channel Impedance Value: 452 Ohm
Zone Setting Detection Interval: 300 ms

## 2013-03-26 NOTE — Progress Notes (Signed)
Patient ID: [name], male   DOB: Oct 22, 1934, 78 y.o.   MRN: [id number]     HPI: [name] is a 78 y.o. male who presents to the office for six-month cardiology evaluation.  [name] has a history of an ischemic cardiac myopathy and in April 1998 several large internal myocardial infarction and underwent intervention to the LAD. In April 1999 a stent was placed to the LAD and he had PTCA of his circumflex vessel. In November 2004 he underwent stenting of his right coronary artery. I had not seen him for a 7 year period but ultimately he presented to me in January 2014. Prior to that, he had been hospitalized after a fall and developed rib fractures and a mild subarachnoid hemorrhage. He also was found to be in permanent atrial fibrillation and had been on Coumadin therapy. When I saw him in February 2014 and nuclear study showed extensive scar entire LAD territory as well as a large portion of the RCA territory with minimal borderline ischemia. Ejection fraction was less than 20% on echo Doppler study in April 2014 consequently I recommended he undergo an ICD implantation. This was done by Dr.Croitoru in May 2014.  Additional problems include hypertension, peripheral edema, Coumadin anticoagulation, and hyperlipidemia. He also has obesity. He has a history of obstructive sleep apnea and is on CPAP therapy.  He does admit to frequent having ecchymoses in his right upper arm. He has purposely lost weight from a maximum weight of 245 to approximately 190.  He presently denies chest pain. He denies shortness of breath. He is unaware of any defibrillator discharge. He did see Dr. one month after his ICD implantation.  Past Medical History  Diagnosis Date  . Arteriosclerotic cardiovascular disease (ASCVD)   . COPD (chronic obstructive pulmonary disease)   . Gout   . DJD (degenerative joint disease)   . Hyperlipidemia   . Chronic anticoagulation 2012    2012  . CHF (congestive heart failure)     . Atrial fibrillation     Onset in 2012  . Atrial flutter   . ICD (implantable cardiac defibrillator) in place   . Myocardial infarction 1998  . Ischemic cardiomyopathy   . Obstructive sleep apnea     "went away when I lost a bunch of weight" (08/17/2012)  . Type II diabetes mellitus   . Kidney stone     "just once" (08/17/2012)  . NICM (nonischemic cardiomyopathy), EF 25-30% 2012/09/13  . At risk for sudden cardiac death 13-Sep-2012  . Permanent atrial fibrillation 02/15/2011    Initial onset in 03/2011 with rapid ventricular response   . S/P ICD (internal cardiac defibrillator) procedure, 08/17/12, [facility] 09-13-2012    [facility]    Past Surgical History  Procedure Laterality Date  . Cholecystectomy  2009  . Knee arthroplasty Left 1978    "tendon & cartilege repair" (08/17/2012)  . Vasectomy  ~ 1964  . Cystoscopy/retrograde/ureteroscopy  06/28/2011    Procedure: CYSTOSCOPY/RETROGRADE/URETEROSCOPY;  Surgeon: [name] Barban, MD;  Location: [facility];  Service: Urology;  Laterality: Right;  . Stone extraction with basket  06/28/2011    Procedure: STONE EXTRACTION WITH BASKET;  Surgeon: [name] Barban, MD;  Location: [facility];  Service: Urology;  Laterality: Right;  specimen given to family per MD  . Cardiac defibrillator placement  08/17/2012    [employer]  . Coronary angioplasty with stent placement  07/14/1996    "1" (08/17/2012)  . Cataract extraction w/ intraocular lens  implant, bilateral Bilateral ~ 2011  . US echocardiography  07/08/2012    EF <20%,mild MR,TR,LA severely dilated  . Myoview perfusion scan  06/02/2012    low risk, extensive scar entire LAD & RCA territory  . S/p icd  08/2012    [facility]    Allergies  Allergen Reactions  . Lipitor [Atorvastatin] Other (See Comments)    myalgias   . Procaine Hcl Nausea And Vomiting  . Tramadol Nausea And Vomiting    Current Outpatient Prescriptions  Medication Sig Dispense Refill  . carvedilol (COREG)  12.5 MG tablet Take 12.5 mg by mouth 2 (two) times daily with a meal.      . digoxin (LANOXIN) 0.25 MG tablet Take 1 tablet (0.25 mg total) by mouth daily.  30 tablet  3  . furosemide (LASIX) 40 MG tablet Take 40 mg by mouth daily.       . magnesium oxide (MAG-OX) 400 MG tablet Take 400 mg by mouth 2 (two) times daily.        . metFORMIN (GLUCOPHAGE) 500 MG tablet Take 500 mg by mouth 2 (two) times daily with a meal.       . Omega-3 Fatty Acids (FISH OIL BURP-LESS) 1000 MG CAPS Take 2,000 mg by mouth daily.       . potassium chloride SA (K-DUR,KLOR-CON) 20 MEQ tablet Take 1 tablet (20 mEq total) by mouth every other day.  90 tablet  1  . pravastatin (PRAVACHOL) 40 MG tablet Take 40 mg by mouth daily.      . Tamsulosin HCl (FLOMAX) 0.4 MG CAPS Take 0.4 mg by mouth daily after breakfast.      . warfarin (COUMADIN) 4 MG tablet Takes 4 mg for two days, then 2 mg (1/2 tablet) one day.       No current facility-administered medications for this visit.    History   Social History  . Marital Status: Married    Spouse Name: N/A    Number of Children: N/A  . Years of Education: N/A   Occupational History  . Not on file.   Social History Main Topics  . Smoking status: Former Smoker -- 2.00 packs/day for 40 years    Types: Cigarettes    Quit date: 04/08/1992  . Smokeless tobacco: Former Neurosurgeon  . Alcohol Use: No     Comment: 08/17/2012 "quit drinking in 1983"  . Drug Use: No  . Sexual Activity: No   Other Topics Concern  . Not on file   Social History Narrative  . No narrative on file    Family History  Problem Relation Age of Onset  . Heart failure Mother   . Heart failure Father     ROS is negative for fevers, chills or night sweats. He does admit to achymosis in the right upper arm. He does admit to weight loss purposefully. He denies new vision changes. He denies hearing issues. He denies cough increased sputum production or wheezing. There is no PND or orthopnea. He does note  mild shortness of breath with activity. He denies chest pain. He denies blood in stool or urine. Denies nausea or vomiting or diarrhea. He denies claudication. He denies myalgias. He does note lower extremity edema. He is diabetic. He denies cold or heat intolerance. He denies paresthesias. He denies psychologic issues.  He is on CPAP therapy for his obstructive sleep apnea denies any residual daytime sleepiness the Other comprehensive 12 point system review is negative.  PE BP 132/80  Pulse 68  Ht 5' 4.5" (1.638 m)  Wt 192 lb (87.091 kg)  BMI 32.46 kg/m2  General: Alert, oriented, no distress.  Skin: normal turgor, no rashes; area of ecchymosis in the right upper arm HEENT: Normocephalic, atraumatic. Pupils round and reactive; sclera anicteric;no lid lag.  Nose without nasal septal hypertrophy Mouth/Parynx benign; Mallinpatti scale mild arterial narrowing without hemorrhages or exudate Neck: No JVD, no carotid briuts Lungs: clear to ausculatation and percussion; no wheezing or rales Chest wall: no tenderness to palpitation Heart: RRR, s1 s2 normal 1/6 systolic murmur Abdomen: soft, nontender; no hepatosplenomehaly, BS+; abdominal aorta nontender and not dilated by palpation. Back: no CVA tenderness Pulses 2+ Extremities: 1 a 2+ pitting edema in his lowerextremity,  no clubbing cyanosis, Homan's sign negative  Neurologic: grossly nonfocal Psychologic: normal affect and mood.  ECG: Underlying atrial fibrillation with appropriate Ventricular pacing and sensing.  LABS:  BMET    Component Value Date/Time   NA 143 04/10/2012 1205   K 4.2 04/10/2012 1205   CL 99 04/10/2012 1205   CO2 33* 04/10/2012 1205   GLUCOSE 151* 04/10/2012 1205   BUN 12 04/10/2012 1205   CREATININE 1.07 04/10/2012 1205   CREATININE 1.05 03/26/2012 0515   CALCIUM 10.1 04/10/2012 1205   GFRNONAA 67* 03/26/2012 0515   GFRAA 78* 03/26/2012 0515     Hepatic Function Panel     Component Value Date/Time   PROT 6.7 03/26/2012  0515   ALBUMIN 3.8 03/26/2012 0515   AST 19 03/26/2012 0515   ALT 14 03/26/2012 0515   ALKPHOS 76 03/26/2012 0515   BILITOT 0.6 03/26/2012 0515   BILIDIR 0.2 03/26/2012 0515   IBILI 0.4 03/26/2012 0515     CBC    Component Value Date/Time   WBC 6.8 03/25/2012 0513   RBC 4.31 03/25/2012 0513   HGB 13.9 03/25/2012 0513   HCT 42.3 03/25/2012 0513   PLT 152 03/25/2012 0513   MCV 98.1 03/25/2012 0513   MCH 32.3 03/25/2012 0513   MCHC 32.9 03/25/2012 0513   RDW 14.3 03/25/2012 0513   LYMPHSABS 1.8 03/24/2012 1015   MONOABS 0.7 03/24/2012 1015   EOSABS 0.1 03/24/2012 1015   BASOSABS 0.0 03/24/2012 1015     BNP    Component Value Date/Time   PROBNP 3742.0* 03/25/2012 0513    Lipid Panel     Component Value Date/Time   CHOL 117 02/15/2011 0824   TRIG 155* 02/15/2011 0824   HDL 38* 02/15/2011 0824   CHOLHDL 3.1 02/15/2011 0824   VLDL 31 02/15/2011 0824   LDLCALC 48 02/15/2011 0824     RADIOLOGY: No results found.    ASSESSMENT AND PLAN: ShawnBryan is a very pleasant 78 year old gentleman who has a history of an ischemic cardiomyopathy and suffered his initial anterior wall myocardial infarction in 1998. He has subsequently developed permanent atrial fibrillation and recently underwent ICD implantation by Dr. Royann Shivers with a single chamber ICD for primary prevention per Maditt II criteria. Presently, he is doing well but does have 1-2+ lower extremity edema. I am suggesting increase his Lasix to 40 mg daily from every other day. His blood pressure is well-controlled. He continues to be on pravastatin for lipid lowering therapy. He is compensated without CHF symptoms. I commended him on his continued weight loss. He is on Coumadin anticoagulation. He will followup with Dr. courtroom in May for one-year evaluation following his ICD implantation and I will see him 3 months later for a 9 month followup cardiology  evaluation.     Lennette Bihari, MD, [facility]  03/26/2013 9:50  AM

## 2013-05-28 IMAGING — CT CT ANGIO CHEST
1 of 5 series · 5 of 36 positions shown · IV contrast (omnipaque)
Comparison: Chest radiograph dated 03/24/2012.

CLINICAL DATA: Evaluate for pulmonary embolus.  History of AFib,
COPD, CHF.

CT ANGIOGRAPHY CHEST
TECHNIQUE: Multidetector CT imaging of the chest using the
standard protocol during bolus administration of intravenous
contrast. Multiplanar reconstructed images including MIPs were
obtained and reviewed to evaluate the vascular anatomy.
Contrast: 100mL OMNIPAQUE IOHEXOL 300 MG/ML  SOLN

[Series 4: pe 3.0 b40f · axial · 0.69mm/px · z∈[+650,+818]mm · 5 of 85 slices shown]
[im 15/85  lung]
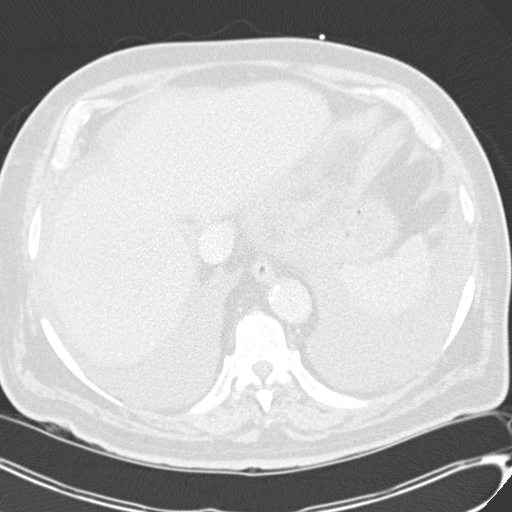
[im 29/85  mediastinal]
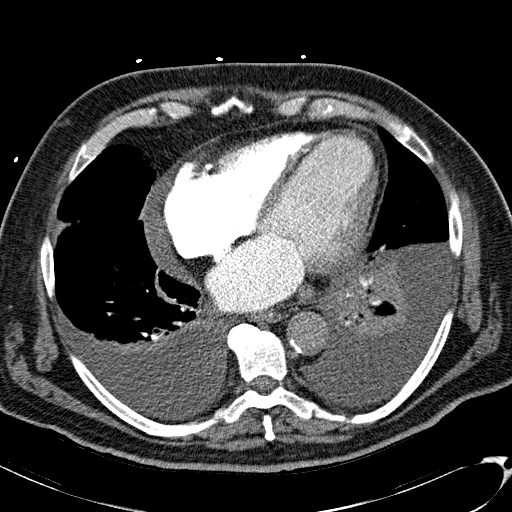
[im 43/85  lung]
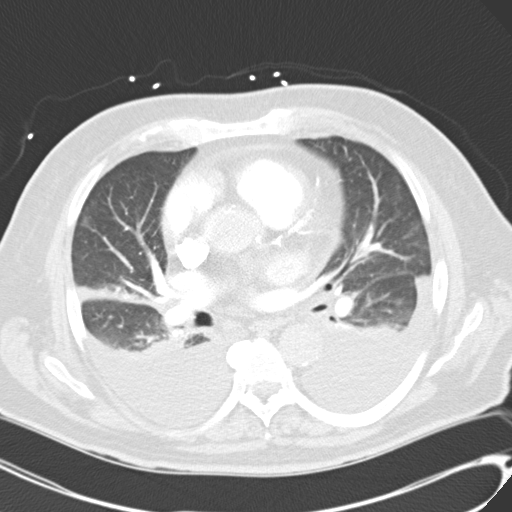
[im 57/85  mediastinal]
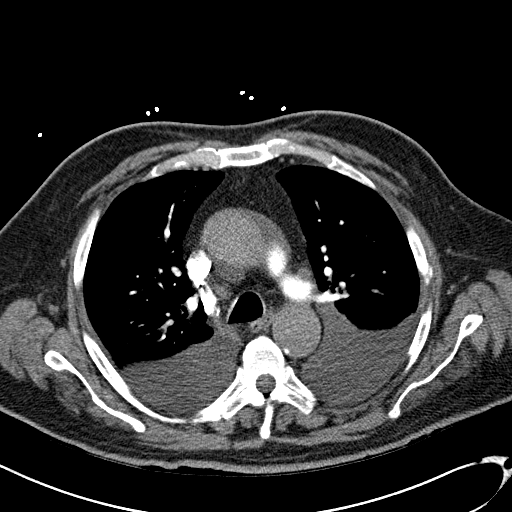
[im 71/85  lung]
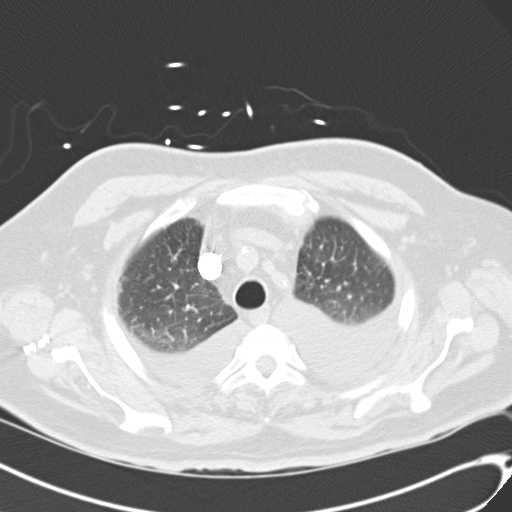

[5 of 36 positions shown; findings below may reference images not displayed]

FINDINGS: No evidence of pulmonary embolism.

Mild interstitial edema.  Moderate bilateral pleural effusions.
Associated lower lobe compressive atelectasis.  No pneumothorax.

Cardiomegaly.  Small pericardial effusion.  Coronary
atherosclerosis.  Atherosclerotic calcifications of the aortic
arch.

No suspicious mediastinal, hilar, or axillary lymphadenopathy.

Mild nodularity of the left inferior thyroid.

Visualized upper abdomen is unremarkable.

Degenerative changes of the visualized thoracolumbar spine.
IMPRESSION: No evidence of pulmonary embolism.

Cardiomegaly with mild interstitial edema and moderate bilateral
pleural effusions.

## 2013-06-23 ENCOUNTER — Ambulatory Visit (INDEPENDENT_AMBULATORY_CARE_PROVIDER_SITE_OTHER): Payer: Medicare HMO | Admitting: *Deleted

## 2013-06-23 DIAGNOSIS — I255 Ischemic cardiomyopathy: Secondary | ICD-10-CM

## 2013-06-23 DIAGNOSIS — I2589 Other forms of chronic ischemic heart disease: Secondary | ICD-10-CM

## 2013-06-23 LAB — PACEMAKER DEVICE OBSERVATION

## 2013-06-26 LAB — MDC_IDC_ENUM_SESS_TYPE_REMOTE
Battery Remaining Longevity: 144 mo
Brady Statistic RV Percent Paced: 8 %
HIGH POWER IMPEDANCE MEASURED VALUE: 85 Ohm
Implantable Pulse Generator Serial Number: 106496
Lead Channel Setting Pacing Amplitude: 2.5 V
Lead Channel Setting Pacing Pulse Width: 0.4 ms
MDC IDC MSMT LEADCHNL RV IMPEDANCE VALUE: 472 Ohm
MDC IDC MSMT LEADCHNL RV SENSING INTR AMPL: 14.5 mV
MDC IDC SESS DTM: 20150318133600
MDC IDC SET ZONE DETECTION INTERVAL: 300 ms

## 2013-07-05 NOTE — Progress Notes (Signed)
ICD remote 

## 2013-07-09 ENCOUNTER — Encounter: Payer: Self-pay | Admitting: *Deleted

## 2013-07-12 ENCOUNTER — Other Ambulatory Visit: Payer: Self-pay | Admitting: *Deleted

## 2013-07-12 MED ORDER — FUROSEMIDE 40 MG PO TABS: 40.0000 mg | ORAL_TABLET | Freq: Every day | ORAL | Status: DC

## 2013-07-12 NOTE — Telephone Encounter (Signed)
Rx refill sent to pt's pharmacy 

## 2013-08-02 ENCOUNTER — Emergency Department (HOSPITAL_COMMUNITY)
Admission: EM | Admit: 2013-08-02 | Discharge: 2013-08-02 | Disposition: A | Discharge status: Home / Self Care | Payer: Medicare HMO | Attending: Emergency Medicine | Admitting: Emergency Medicine

## 2013-08-02 ENCOUNTER — Encounter (HOSPITAL_COMMUNITY): Payer: Self-pay | Admitting: Emergency Medicine

## 2013-08-02 ENCOUNTER — Emergency Department (HOSPITAL_COMMUNITY): Payer: Medicare HMO

## 2013-08-02 DIAGNOSIS — Z8739 Personal history of other diseases of the musculoskeletal system and connective tissue: Secondary | ICD-10-CM | POA: Insufficient documentation

## 2013-08-02 DIAGNOSIS — E119 Type 2 diabetes mellitus without complications: Secondary | ICD-10-CM | POA: Insufficient documentation

## 2013-08-02 DIAGNOSIS — I2510 Atherosclerotic heart disease of native coronary artery without angina pectoris: Secondary | ICD-10-CM | POA: Insufficient documentation

## 2013-08-02 DIAGNOSIS — Y9389 Activity, other specified: Secondary | ICD-10-CM | POA: Insufficient documentation

## 2013-08-02 DIAGNOSIS — I2589 Other forms of chronic ischemic heart disease: Secondary | ICD-10-CM | POA: Insufficient documentation

## 2013-08-02 DIAGNOSIS — J449 Chronic obstructive pulmonary disease, unspecified: Secondary | ICD-10-CM | POA: Insufficient documentation

## 2013-08-02 DIAGNOSIS — S0990XA Unspecified injury of head, initial encounter: Secondary | ICD-10-CM

## 2013-08-02 DIAGNOSIS — I4892 Unspecified atrial flutter: Secondary | ICD-10-CM | POA: Insufficient documentation

## 2013-08-02 DIAGNOSIS — E785 Hyperlipidemia, unspecified: Secondary | ICD-10-CM | POA: Insufficient documentation

## 2013-08-02 DIAGNOSIS — S300XXA Contusion of lower back and pelvis, initial encounter: Secondary | ICD-10-CM | POA: Insufficient documentation

## 2013-08-02 DIAGNOSIS — Z9889 Other specified postprocedural states: Secondary | ICD-10-CM | POA: Insufficient documentation

## 2013-08-02 DIAGNOSIS — J4489 Other specified chronic obstructive pulmonary disease: Secondary | ICD-10-CM | POA: Insufficient documentation

## 2013-08-02 DIAGNOSIS — I428 Other cardiomyopathies: Secondary | ICD-10-CM | POA: Insufficient documentation

## 2013-08-02 DIAGNOSIS — Z96659 Presence of unspecified artificial knee joint: Secondary | ICD-10-CM | POA: Insufficient documentation

## 2013-08-02 DIAGNOSIS — I509 Heart failure, unspecified: Secondary | ICD-10-CM | POA: Insufficient documentation

## 2013-08-02 DIAGNOSIS — Z87891 Personal history of nicotine dependence: Secondary | ICD-10-CM | POA: Insufficient documentation

## 2013-08-02 DIAGNOSIS — Z95810 Presence of automatic (implantable) cardiac defibrillator: Secondary | ICD-10-CM | POA: Insufficient documentation

## 2013-08-02 DIAGNOSIS — Z7901 Long term (current) use of anticoagulants: Secondary | ICD-10-CM | POA: Insufficient documentation

## 2013-08-02 DIAGNOSIS — W010XXA Fall on same level from slipping, tripping and stumbling without subsequent striking against object, initial encounter: Secondary | ICD-10-CM | POA: Insufficient documentation

## 2013-08-02 DIAGNOSIS — Z79899 Other long term (current) drug therapy: Secondary | ICD-10-CM | POA: Insufficient documentation

## 2013-08-02 DIAGNOSIS — I252 Old myocardial infarction: Secondary | ICD-10-CM | POA: Insufficient documentation

## 2013-08-02 DIAGNOSIS — Z87442 Personal history of urinary calculi: Secondary | ICD-10-CM | POA: Insufficient documentation

## 2013-08-02 DIAGNOSIS — Y92009 Unspecified place in unspecified non-institutional (private) residence as the place of occurrence of the external cause: Secondary | ICD-10-CM | POA: Insufficient documentation

## 2013-08-02 DIAGNOSIS — Z9861 Coronary angioplasty status: Secondary | ICD-10-CM | POA: Insufficient documentation

## 2013-08-02 DIAGNOSIS — I4891 Unspecified atrial fibrillation: Secondary | ICD-10-CM | POA: Insufficient documentation

## 2013-08-02 MED ORDER — HYDROCODONE-ACETAMINOPHEN 5-325 MG PO TABS: 1.0000 | ORAL_TABLET | ORAL | Status: DC | PRN

## 2013-08-02 MED ORDER — OXYCODONE-ACETAMINOPHEN 5-325 MG PO TABS: 1.0000 | ORAL_TABLET | ORAL | Status: DC | PRN

## 2013-08-02 MED ORDER — HYDROCODONE-ACETAMINOPHEN 5-325 MG PO TABS
1.0000 | ORAL_TABLET | Freq: Once | ORAL | Status: DC
  Filled 2013-08-02: qty 1

## 2013-08-02 MED ORDER — IBUPROFEN 800 MG PO TABS
ORAL_TABLET | ORAL | Status: AC
  Administered 2013-08-02: 800 mg
  Filled 2013-08-02: qty 1

## 2013-08-02 NOTE — ED Notes (Signed)
Pt c/o fall and with Right hip pain this am around 0900.

## 2013-08-02 NOTE — ED Provider Notes (Signed)
Medical screening examination/treatment/procedure(s) were performed by non-physician practitioner and as supervising physician I was immediately available for consultation/collaboration.   EKG Interpretation None        Auriana Scalia L Ravi Tuccillo, MD 08/02/13 2258 

## 2013-08-02 NOTE — ED Notes (Signed)
Pt was not given the vicodin, says he is driving

## 2013-08-02 NOTE — ED Provider Notes (Signed)
CSN: [id number]     Arrival date & time 08/02/13  1711 History   First MD Initiated Contact with Patient 08/02/13 1900     Chief Complaint  Patient presents with  . Hip Pain     (Consider location/radiation/quality/duration/timing/severity/associated sxs/prior Treatment) HPI Comments: [name] is a 79 y.o. Male presenting with pain do to trauma in his right posterior buttock.  He describes tripping over his dog  About 8:30 this morning landing on his right buttock directly on his wallet and also hit his head on a throw rub in his home.  He denies LOC and denies confusion, headache, dizziness or focal weakness since this event.  His main complaint is right buttock pain which is worse with palpation and with ambulation.  He describes having a large "knot" at the site which is improved since earlier today.  He's had no medications for his injuries prior to arrival.  He is on Coumadin and saw his PCP this morning for lab and reports his INR was 2.5.  He did not discuss today's fall with his PCP.  Pain is worse with movement and palpation and he has taken no medications prior to arrival for pain relief.     The history is provided by the patient.    Past Medical History  Diagnosis Date  . Arteriosclerotic cardiovascular disease (ASCVD)   . COPD (chronic obstructive pulmonary disease)   . Gout   . DJD (degenerative joint disease)   . Hyperlipidemia   . Chronic anticoagulation 2012    2012  . CHF (congestive heart failure)   . Atrial fibrillation     Onset in 2012  . Atrial flutter   . ICD (implantable cardiac defibrillator) in place   . Myocardial infarction 1998  . Ischemic cardiomyopathy   . Obstructive sleep apnea     "went away when I lost a bunch of weight" (08/17/2012)  . Type II diabetes mellitus   . Kidney stone     "just once" (08/17/2012)  . NICM (nonischemic cardiomyopathy), EF 25-30% 2012-09-04  . At risk for sudden cardiac death Sep 04, 2012  . Permanent atrial  fibrillation 02/15/2011    Initial onset in 03/2011 with rapid ventricular response   . S/P ICD (internal cardiac defibrillator) procedure, 08/17/12, [employer] Sep 04, 2012    [facility]   Past Surgical History  Procedure Laterality Date  . Cholecystectomy  2009  . Knee arthroplasty Left 1978    "tendon & cartilege repair" (08/17/2012)  . Vasectomy  ~ 1964  . Cystoscopy/retrograde/ureteroscopy  06/28/2011    Procedure: CYSTOSCOPY/RETROGRADE/URETEROSCOPY;  Surgeon: Ky Barban, MD;  Location: [facility];  Service: Urology;  Laterality: Right;  . Stone extraction with basket  06/28/2011    Procedure: STONE EXTRACTION WITH BASKET;  Surgeon: Ky Barban, MD;  Location: [facility];  Service: Urology;  Laterality: Right;  specimen given to family per MD  . Cardiac defibrillator placement  08/17/2012    [employer]  . Coronary angioplasty with stent placement  07/14/1996    "1" (08/17/2012)  . Cataract extraction w/ intraocular lens  implant, bilateral Bilateral ~ 2011  . US echocardiography  07/08/2012    EF <20%,mild MR,TR,LA severely dilated  . Myoview perfusion scan  06/02/2012    low risk, extensive scar entire LAD & RCA territory  . S/p icd  08/2012    [facility]   Family History  Problem Relation Age of Onset  . Heart failure Mother   . Heart failure Father  History  Substance Use Topics  . Smoking status: Former Smoker -- 2.00 packs/day for 40 years    Types: Cigarettes    Quit date: 04/08/1992  . Smokeless tobacco: Former NeurosurgeonUser  . Alcohol Use: No     Comment: 08/17/2012 "quit drinking in 1983"    Review of Systems  Constitutional: Negative for fever.  Musculoskeletal: Positive for arthralgias. Negative for joint swelling and myalgias.  Neurological: Negative for dizziness, weakness, light-headedness, numbness and headaches.      Allergies  Lipitor; Procaine hcl; and Tramadol  Home Medications   Prior to Admission medications   Medication Sig Start Date  End Date Taking? Authorizing Provider  furosemide (LASIX) 40 MG tablet Take 1 tablet (40 mg total) by mouth daily. 07/12/13  Yes Lennette Biharihomas A Kelly, MD  magnesium oxide (MAG-OX) 400 MG tablet Take 400 mg by mouth 2 (two) times daily.     Yes Historical Provider, MD  metFORMIN (GLUCOPHAGE) 500 MG tablet Take 500 mg by mouth 2 (two) times daily with a meal.    Yes Historical Provider, MD  Omega-3 Fatty Acids (FISH OIL BURP-LESS) 1000 MG CAPS Take 1,200 mg by mouth 2 (two) times daily.    Yes Historical Provider, MD  potassium chloride SA (K-DUR,KLOR-CON) 20 MEQ tablet Take 1 tablet (20 mEq total) by mouth every other day. 04/10/12  Yes Pricilla RifflePaula V Ross, MD  Tamsulosin HCl (FLOMAX) 0.4 MG CAPS Take 0.4 mg by mouth daily after breakfast.   Yes Historical Provider, MD  warfarin (COUMADIN) 4 MG tablet Take 2-4 mg by mouth See admin instructions. Takes 2mg  for two days, then 4mg  (1 tablet) one day.   Yes Historical Provider, MD  carvedilol (COREG) 12.5 MG tablet Take 12.5 mg by mouth 2 (two) times daily with a meal.    Historical Provider, MD  digoxin (LANOXIN) 0.25 MG tablet Take 1 tablet (0.25 mg total) by mouth daily. 02/28/12 03/24/13  Freeman CaldronMichael J. Jeffery, PA-C  oxyCODONE-acetaminophen (PERCOCET/ROXICET) 5-325 MG per tablet Take 1 tablet by mouth every 4 (four) hours as needed for severe pain. 08/02/13   Burgess AmorJulie Terel Bann, PA-C  pravastatin (PRAVACHOL) 40 MG tablet Take 40 mg by mouth daily.    Historical Provider, MD   BP 122/70  Pulse 72  Temp(Src) 97.9 F (36.6 C) (Oral)  Resp 16  Ht 5' 5.5" (1.664 m)  Wt 178 lb (80.74 kg)  BMI 29.16 kg/m2  SpO2 97% Physical Exam  Nursing note and vitals reviewed. Constitutional: He appears well-developed and well-nourished.  HENT:  Head: Normocephalic and atraumatic.  Eyes: Conjunctivae are normal.  Neck: Normal range of motion. Neck supple. No spinous process tenderness and no muscular tenderness present. Normal range of motion present.  Cardiovascular: Normal rate,  regular rhythm, normal heart sounds and intact distal pulses.   Pulmonary/Chest: Effort normal and breath sounds normal. He has no wheezes.  Abdominal: Soft. Bowel sounds are normal. There is no tenderness.  Musculoskeletal: Normal range of motion.       Right hip: He exhibits normal range of motion, normal strength, no tenderness, no bony tenderness, no swelling, no crepitus and no deformity.       Left hip: He exhibits normal range of motion, normal strength, no tenderness, no bony tenderness, no swelling, no crepitus and no deformity.       Back:  Faint ecchymosis right posterior buttock without hematoma.  Neurological: He is alert.  Skin: Skin is warm and dry.  Psychiatric: He has a normal mood and affect.  ED Course  Procedures (including critical care time) Labs Review Labs Reviewed - No data to display  Imaging Review Dg Hip Bilateral W/pelvis  08/02/2013   CLINICAL DATA:  Bilateral hip pain, right greater than left  EXAM: BILATERAL HIP WITH PELVIS - 4+ VIEW  COMPARISON:  DG PELVIS 1-2 VIEWS dated 02/27/2012  FINDINGS: There is no acute fracture or dislocation. The SI joints are unremarkable. The joint spaces are maintained.  There is peripheral vascular atherosclerotic disease.  IMPRESSION: No acute osseous injury of bilateral hips.   Electronically Signed   By: Elige Ko   On: 08/02/2013 18:30   Ct Head Wo Contrast  08/02/2013   CLINICAL DATA:  Fall and hit back of head.  EXAM: CT HEAD WITHOUT CONTRAST  TECHNIQUE: Contiguous axial images were obtained from the base of the skull through the vertex without contrast.  COMPARISON:  03/06/2012  FINDINGS: There is stable cerebral atrophy. No evidence for acute hemorrhage, mass lesion, midline shift, hydrocephalus or large infarct. Visualized sinuses are clear. No acute bone abnormality.  IMPRESSION: Stable head CT.  No acute intracranial abnormality.   Electronically Signed   By: Richarda Overlie M.D.   On: 08/02/2013 20:07     EKG  Interpretation None      MDM   Final diagnoses:  Contusion, buttock  Minor head injury without loss of consciousness    Patients labs and/or radiological studies were viewed and considered during the medical decision making and disposition process. Patient was seen by Dr. Estell Harpin prior to discharge home.  He was prescribed oxycodone, cautioned regarding sedation.  When necessary follow up with his PCP anticipated.    Burgess Amor, PA-C 08/02/13 2051

## 2013-08-02 NOTE — ED Notes (Signed)
Pt tripped over dog, struck back of his head, No LOC.  Alert.Has already had x-ray of his hip

## 2013-08-02 NOTE — Discharge Instructions (Signed)
Head Injury, Adult You have a head injury. Headaches and throwing up (vomiting) are common after a head injury. It should be easy to wake up from sleeping. Sometimes you must stay in the hospital. Most problems happen within the first 24 hours. Side effects may occur up to 7 10 days after the injury.  WHAT ARE THE TYPES OF HEAD INJURIES? Head injuries can be as minor as a bump. Some head injuries can be more severe. More severe head injuries include:  A jarring injury to the brain (concussion).  A bruise of the brain (contusion). This mean there is bleeding in the brain that can cause swelling.  A cracked skull (skull fracture).  Bleeding in the brain that collects, clots, and forms a bump (hematoma). . WHEN SHOULD I GET HELP RIGHT AWAY?   You are confused or sleepy.  You cannot be woken up.  You feel sick to your stomach (nauseous) or keep throwing up.  Your dizziness or unsteadiness is get worse.  You have very bad, lasting headaches that are not helped by medicine.  You cannot use your arms or legs like normal  You cannot walk.  You notice changes in the black spots in the center of the colored part of your eye (pupil).  You have clear or bloody fluid coming from your nose or ears.  You have trouble seeing. During the next 24 hours after the injury, you must stay with someone who can watch you. This person should get help right away (call 911 in the [place] if you start to shake and are not able to control it (seizures), you become pass out, or you are unable to wake up. HOW CAN I PREVENT A HEAD INJURY IN THE FUTURE?  Wear seat belts.  Wear helmets while bike riding and playing sports like football.  Stay away from dangerous activities around the house. WHEN CAN I RETURN TO NORMAL ACTIVITIES AND ATHLETICS? See your doctor before doing these activities. You should not do normal activities or play contact sports until 1 week after the following symptoms have  stopped:  Headache that does not go away.  Dizziness.  Poor attention.  Confusion.  Memory problems.  Sickness to your stomach or throwing up.  Tiredness.  Fussiness.  Bothered by bright lights or loud noises.  Anxiousness or depression.  Restless sleep. MAKE SURE YOU:   Understand these instructions.  Will watch your condition.  Will get help right away if you are not doing well or get worse. Document Released: 03/07/2008 Document Revised: 01/13/2013 Document Reviewed: 11/30/2012 [facility] Patient Information 2014 [place], [place].  Contusion A contusion is a deep bruise. Contusions happen when an injury causes bleeding under the skin. Signs of bruising include pain, puffiness (swelling), and discolored skin. The contusion may turn blue, purple, or yellow. HOME CARE   Put ice on the injured area.  Put ice in a plastic bag.  Place a towel between your skin and the bag.  Leave the ice on for 15-20 minutes, 03-04 times a day.  Only take medicine as told by your doctor.  Rest the injured area.  If possible, raise (elevate) the injured area to lessen puffiness. GET HELP RIGHT AWAY IF:   You have more bruising or puffiness.  You have pain that is getting worse.  Your puffiness or pain is not helped by medicine. MAKE SURE YOU:   Understand these instructions.  Will watch your condition.  Will get help right away if you are not doing  well or get worse. Document Released: 09/11/2007 Document Revised: 06/17/2011 Document Reviewed: 01/28/2011 [facility] Patient Information 2014 [place].   You may take the hydrocodone prescribed for pain relief.  This will make you drowsy - do not drive within 4 hours of taking this medication.  Your x-ray and CT scans are negative for any internal injuries.  Please followup with Dr. Delbert Harnesson Diego if you continue to have symptoms beyond the next week or for any worsened symptoms.

## 2013-10-27 ENCOUNTER — Encounter: Payer: Self-pay | Admitting: *Deleted

## 2013-11-17 ENCOUNTER — Telehealth: Payer: Self-pay | Admitting: Cardiovascular Disease

## 2013-11-18 NOTE — Telephone Encounter (Signed)
Closed encounter °

## 2013-11-24 ENCOUNTER — Telehealth: Payer: Self-pay | Admitting: Cardiovascular Disease

## 2013-11-24 NOTE — Telephone Encounter (Signed)
Pt's daughter called in  insisting that her father be seen by because he stated that he has some burning in his chest and he feels like some wires are poking him. He has a defibrillator that was put in by Dr.C. Please call  Thanks

## 2013-11-24 NOTE — Telephone Encounter (Signed)
Notified daughter of appointment for 11/25/13 at 12 noon - bring med, insurance card , and co- pay INFORMED PATIENT IF ANY MORE PROBLEMS AND CAN NOT WAIT GO TO nearsest ER SHE VERBALIZED UNDERSTANDING

## 2013-11-24 NOTE — Telephone Encounter (Signed)
SPOKE TO daughter . She states that her father has been having a "sharp burning"  Pain in chest near defib. Occurring yesterday while driving. She state he said it poking him. RN asked  If she can speak to patient. Daughter states no he is not at the same place. 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 RN offered for patient to see someone else. Daughter states He does not want to see anyone else. "If Dr Royann Shivers put it in, then he wants to see Dr C only." RN informed daughter will call her back after reviewing schedule. She verbalized understanding.

## 2013-11-24 NOTE — Telephone Encounter (Signed)
Daughter calling again,says pt needs to be seen today.

## 2013-11-25 ENCOUNTER — Encounter: Payer: Self-pay | Admitting: Cardiovascular Disease

## 2013-11-25 ENCOUNTER — Ambulatory Visit (INDEPENDENT_AMBULATORY_CARE_PROVIDER_SITE_OTHER): Payer: Medicare HMO | Admitting: Cardiovascular Disease

## 2013-11-25 VITALS — BP 104/58 | HR 71 | Resp 16 | Ht 65.0 in | Wt 174.7 lb

## 2013-11-25 DIAGNOSIS — I428 Other cardiomyopathies: Secondary | ICD-10-CM

## 2013-11-25 DIAGNOSIS — Z95810 Presence of automatic (implantable) cardiac defibrillator: Secondary | ICD-10-CM

## 2013-11-25 DIAGNOSIS — I5042 Chronic combined systolic (congestive) and diastolic (congestive) heart failure: Secondary | ICD-10-CM

## 2013-11-25 DIAGNOSIS — I255 Ischemic cardiomyopathy: Secondary | ICD-10-CM

## 2013-11-25 DIAGNOSIS — I4891 Unspecified atrial fibrillation: Secondary | ICD-10-CM

## 2013-11-25 DIAGNOSIS — I509 Heart failure, unspecified: Secondary | ICD-10-CM

## 2013-11-25 DIAGNOSIS — I2589 Other forms of chronic ischemic heart disease: Secondary | ICD-10-CM

## 2013-11-25 LAB — PACEMAKER DEVICE OBSERVATION

## 2013-11-25 NOTE — Patient Instructions (Signed)
Remote monitoring is used to monitor your Pacemaker or ICD from home. This monitoring reduces the number of office visits required to check your device to one time per year. It allows [place] to monitor the functioning of your device to ensure it is working properly. You are scheduled for a device check from home on 02-26-2014. You may send your transmission at any time that day. If you have a wireless device, the transmission will be sent automatically. After your physician reviews your transmission, you will receive a postcard with your next transmission date.  Dr. Royann Shivers recommends that you schedule a follow-up appointment in: One year.

## 2013-11-26 ENCOUNTER — Encounter: Payer: Self-pay | Admitting: Cardiovascular Disease

## 2013-11-26 NOTE — Progress Notes (Signed)
Patient ID: [name], male   DOB: Apr 16, 1934, 79 y.o.   MRN: [id number]      Reason for office visit Discomfort at the device site  [facility] asked to be seen today since she has been having some stinging at the site of his defibrillator. He points with the tip of his finger to the medial edge of the scar. This is also the area where the header puts a little pressure on the skin. The site looks very healthy. There is no swelling, redness, warmth or discharge. The device is fairly large and he has a narrow space between his collarbone and humeral head. There may be some discomfort related to this. Interrogation of the device shows normal function and he otherwise has no cardiac complaints. He is physically active without angina or dyspnea. He has not had defibrillator discharges and denies palpitations or syncope   Allergies  Allergen Reactions  . Lipitor [Atorvastatin] Other (See Comments)    myalgias   . Procaine Hcl Nausea And Vomiting  . Tramadol Nausea And Vomiting    Current Outpatient Prescriptions  Medication Sig Dispense Refill  . carvedilol (COREG) 12.5 MG tablet Take 12.5 mg by mouth 2 (two) times daily with a meal.      . digoxin (LANOXIN) 0.25 MG tablet Take 1 tablet (0.25 mg total) by mouth daily.  30 tablet  3  . furosemide (LASIX) 40 MG tablet Take 1 tablet (40 mg total) by mouth daily.  30 tablet  10  . magnesium oxide (MAG-OX) 400 MG tablet Take 400 mg by mouth 2 (two) times daily.        . metFORMIN (GLUCOPHAGE) 500 MG tablet Take 500 mg by mouth 2 (two) times daily with a meal.       . Omega-3 Fatty Acids (FISH OIL BURP-LESS) 1000 MG CAPS Take 1,200 mg by mouth 2 (two) times daily.       Marland Kitchen oxyCODONE-acetaminophen (PERCOCET/ROXICET) 5-325 MG per tablet Take 1 tablet by mouth every 4 (four) hours as needed for severe pain.  20 tablet  0  . pravastatin (PRAVACHOL) 40 MG tablet Take 40 mg by mouth daily.      . Tamsulosin HCl (FLOMAX) 0.4 MG CAPS Take 0.4 mg by mouth daily  after breakfast.      . warfarin (COUMADIN) 4 MG tablet Take 2-4 mg by mouth See admin instructions. Takes 2mg  for two days, then 4mg  (1 tablet) one day.       No current facility-administered medications for this visit.    Past Medical History  Diagnosis Date  . Arteriosclerotic cardiovascular disease (ASCVD)   . COPD (chronic obstructive pulmonary disease)   . Gout   . DJD (degenerative joint disease)   . Hyperlipidemia   . Chronic anticoagulation 2012    2012  . CHF (congestive heart failure)   . Atrial fibrillation     Onset in 2012  . Atrial flutter   . ICD (implantable cardiac defibrillator) in place   . Myocardial infarction 1998  . Ischemic cardiomyopathy   . Obstructive sleep apnea     "went away when I lost a bunch of weight" (08/17/2012)  . Type II diabetes mellitus   . Kidney stone     "just once" (08/17/2012)  . NICM (nonischemic cardiomyopathy), EF 25-30% 08/23/12  . At risk for sudden cardiac death 08-23-2012  . Permanent atrial fibrillation 02/15/2011    Initial onset in 03/2011 with rapid ventricular response   . S/P ICD (  internal cardiac defibrillator) procedure, 08/17/12, [employer] 08/18/2012    [facility]    Past Surgical History  Procedure Laterality Date  . Cholecystectomy  2009  . Knee arthroplasty Left 1978    "tendon & cartilege repair" (08/17/2012)  . Vasectomy  ~ 1964  . Cystoscopy/retrograde/ureteroscopy  06/28/2011    Procedure: CYSTOSCOPY/RETROGRADE/URETEROSCOPY;  Surgeon: Ky Barban, MD;  Location: [facility];  Service: Urology;  Laterality: Right;  . Stone extraction with basket  06/28/2011    Procedure: STONE EXTRACTION WITH BASKET;  Surgeon: Ky Barban, MD;  Location: [facility];  Service: Urology;  Laterality: Right;  specimen given to family per MD  . Cardiac defibrillator placement  08/17/2012    [employer]  . Coronary angioplasty with stent placement  07/14/1996    "1" (08/17/2012)  . Cataract extraction w/ intraocular lens   implant, bilateral Bilateral ~ 2011  . US echocardiography  07/08/2012    EF <20%,mild MR,TR,LA severely dilated  . Myoview perfusion scan  06/02/2012    low risk, extensive scar entire LAD & RCA territory  . S/p icd  08/2012    [employer]    Family History  Problem Relation Age of Onset  . Heart failure Mother   . Heart failure Father     History   Social History  . Marital Status: Married    Spouse Name: N/A    Number of Children: N/A  . Years of Education: N/A   Occupational History  . Not on file.   Social History Main Topics  . Smoking status: Former Smoker -- 2.00 packs/day for 40 years    Types: Cigarettes    Quit date: 04/08/1992  . Smokeless tobacco: Former Neurosurgeon  . Alcohol Use: No     Comment: 08/17/2012 "quit drinking in 1983"  . Drug Use: No  . Sexual Activity: No   Other Topics Concern  . Not on file   Social History Narrative  . No narrative on file    Review of systems: The patient specifically denies any chest pain at rest or with exertion, dyspnea at rest or with exertion, orthopnea, paroxysmal nocturnal dyspnea, syncope, palpitations, focal neurological deficits, intermittent claudication, lower extremity edema, unexplained weight gain, cough, hemoptysis or wheezing.  The patient also denies abdominal pain, nausea, vomiting, dysphagia, diarrhea, constipation, polyuria, polydipsia, dysuria, hematuria, frequency, urgency, abnormal bleeding or bruising, fever, chills, unexpected weight changes, mood swings, change in skin or hair texture, change in voice quality, auditory or visual problems, allergic reactions or rashes, new musculoskeletal complaints other than usual "aches and pains".   PHYSICAL EXAM BP 104/58  Pulse 71  Resp 16  Ht 5\' 5"  (1.651 m)  Wt 174 lb 11.2 oz (79.243 kg)  BMI 29.07 kg/m2 General: Alert, oriented x3, no distress  Head: no evidence of trauma, PERRL, EOMI, no exophtalmos or lid lag, no myxedema, no xanthelasma; normal  ears, nose and oropharynx  Neck: normal jugular venous pulsations and no hepatojugular reflux; brisk carotid pulses without delay and no carotid bruits  Chest: clear to auscultation, no signs of consolidation by percussion or palpation, normal fremitus, symmetrical and full respiratory excursions  The defibrillator site appears healthy.  There is no evidence of swelling redness discharge or bleeding or bruising. The defibrillator does not appear to be in direct contact with the clavicle.  Cardiovascular: Laterally displaced apical impulse, regular rhythm, normal first and second heart sounds, no murmurs, rubs, S4 present  Abdomen: no tenderness or distention, no masses by  palpation, no abnormal pulsatility or arterial bruits, normal bowel sounds, no hepatosplenomegaly  Extremities: no clubbing, cyanosis or edema; 2+ radial, ulnar and brachial pulses bilaterally; 2+ right femoral, posterior tibial and dorsalis pedis pulses; 2+ left femoral, posterior tibial and dorsalis pedis pulses; no subclavian or femoral bruits  Neurological: grossly nonfocal   ECG: Atrial fibrillation with occasional ventricular pacing, nonspecific intraventricular conduction delay with secondary repolarization abnormalities  Lipid Panel     Component Value Date/Time   CHOL 117 02/15/2011 0824   TRIG 155* 02/15/2011 0824   HDL 38* 02/15/2011 0824   CHOLHDL 3.1 02/15/2011 0824   VLDL 31 02/15/2011 0824   LDLCALC 48 02/15/2011 0824    BMET    Component Value Date/Time   NA 143 04/10/2012 1205   K 4.2 04/10/2012 1205   CL 99 04/10/2012 1205   CO2 33* 04/10/2012 1205   GLUCOSE 151* 04/10/2012 1205   BUN 12 04/10/2012 1205   CREATININE 1.07 04/10/2012 1205   CREATININE 1.05 03/26/2012 0515   CALCIUM 10.1 04/10/2012 1205   GFRNONAA 67* 03/26/2012 0515   GFRAA 78* 03/26/2012 0515     ASSESSMENT AND PLAN  Normal appearance of the surgical sites normal device function. No new arrhythmic events detected. No device reprogramming was  necessary and no changes were made to his medications.  No evidence of heart failure exacerbation by exam. No complaints that suggest an active coronary problem  Patient Instructions  Remote monitoring is used to monitor your Pacemaker or ICD from home. This monitoring reduces the number of office visits required to check your device to one time per year. It allows us to monitor the functioning of your device to ensure it is working properly. You are scheduled for a device check from home on 02-26-2014. You may send your transmission at any time that day. If you have a wireless device, the transmission will be sent automatically. After your physician reviews your transmission, you will receive a postcard with your next transmission date.  Dr. Royann Shiversroitoru recommends that you schedule a follow-up appointment in: One year.        Orders Placed This Encounter  Procedures  . EKG 12-Lead   No orders of the defined types were placed in this encounter.    Junious SilkROITORU,Xenia Nile  Maury Groninger, MD, [facility] CenterFACC [facility] [phone] office [phone] pager

## 2013-12-01 ENCOUNTER — Encounter: Payer: Self-pay | Admitting: Cardiovascular Disease

## 2013-12-14 ENCOUNTER — Encounter (HOSPITAL_COMMUNITY): Payer: Self-pay | Admitting: Emergency Medicine

## 2013-12-14 DIAGNOSIS — E785 Hyperlipidemia, unspecified: Secondary | ICD-10-CM | POA: Diagnosis not present

## 2013-12-14 DIAGNOSIS — I509 Heart failure, unspecified: Secondary | ICD-10-CM | POA: Diagnosis not present

## 2013-12-14 DIAGNOSIS — Z7901 Long term (current) use of anticoagulants: Secondary | ICD-10-CM | POA: Insufficient documentation

## 2013-12-14 DIAGNOSIS — Z9861 Coronary angioplasty status: Secondary | ICD-10-CM | POA: Diagnosis not present

## 2013-12-14 DIAGNOSIS — E119 Type 2 diabetes mellitus without complications: Secondary | ICD-10-CM | POA: Diagnosis not present

## 2013-12-14 DIAGNOSIS — J4489 Other specified chronic obstructive pulmonary disease: Secondary | ICD-10-CM | POA: Insufficient documentation

## 2013-12-14 DIAGNOSIS — J449 Chronic obstructive pulmonary disease, unspecified: Secondary | ICD-10-CM | POA: Insufficient documentation

## 2013-12-14 DIAGNOSIS — Z9089 Acquired absence of other organs: Secondary | ICD-10-CM | POA: Diagnosis not present

## 2013-12-14 DIAGNOSIS — N201 Calculus of ureter: Secondary | ICD-10-CM | POA: Diagnosis not present

## 2013-12-14 DIAGNOSIS — I252 Old myocardial infarction: Secondary | ICD-10-CM | POA: Insufficient documentation

## 2013-12-14 DIAGNOSIS — Z87891 Personal history of nicotine dependence: Secondary | ICD-10-CM | POA: Diagnosis not present

## 2013-12-14 DIAGNOSIS — N390 Urinary tract infection, site not specified: Secondary | ICD-10-CM | POA: Diagnosis not present

## 2013-12-14 DIAGNOSIS — Z79899 Other long term (current) drug therapy: Secondary | ICD-10-CM | POA: Insufficient documentation

## 2013-12-14 DIAGNOSIS — I4891 Unspecified atrial fibrillation: Secondary | ICD-10-CM | POA: Diagnosis not present

## 2013-12-14 DIAGNOSIS — R109 Unspecified abdominal pain: Secondary | ICD-10-CM | POA: Insufficient documentation

## 2013-12-14 DIAGNOSIS — Z95810 Presence of automatic (implantable) cardiac defibrillator: Secondary | ICD-10-CM | POA: Insufficient documentation

## 2013-12-14 DIAGNOSIS — Z8739 Personal history of other diseases of the musculoskeletal system and connective tissue: Secondary | ICD-10-CM | POA: Diagnosis not present

## 2013-12-14 NOTE — ED Notes (Signed)
Patient c/o bilateral groin pain since 2100; patient states only getting a few drops of urine out.

## 2013-12-15 ENCOUNTER — Emergency Department (HOSPITAL_COMMUNITY): Payer: Medicare HMO

## 2013-12-15 ENCOUNTER — Emergency Department (HOSPITAL_COMMUNITY)
Admission: EM | Admit: 2013-12-15 | Discharge: 2013-12-15 | Disposition: A | Discharge status: Home / Self Care | Payer: Medicare HMO | Attending: Emergency Medicine | Admitting: Emergency Medicine

## 2013-12-15 DIAGNOSIS — N390 Urinary tract infection, site not specified: Secondary | ICD-10-CM

## 2013-12-15 DIAGNOSIS — N201 Calculus of ureter: Secondary | ICD-10-CM

## 2013-12-15 DIAGNOSIS — R319 Hematuria, unspecified: Secondary | ICD-10-CM

## 2013-12-15 LAB — URINALYSIS, ROUTINE W REFLEX MICROSCOPIC
Glucose, UA: NEGATIVE mg/dL
Leukocytes, UA: NEGATIVE
NITRITE: NEGATIVE
PH: 5 (ref 5.0–8.0)
Protein, ur: 30 mg/dL — AB
Specific Gravity, Urine: >1.03 — ABNORMAL HIGH (ref 1.005–1.030)
UROBILINOGEN UA: 1 mg/dL (ref 0.0–1.0)

## 2013-12-15 LAB — BASIC METABOLIC PANEL
ANION GAP: 13 (ref 5–15)
BUN: 15 mg/dL (ref 6–23)
CALCIUM: 9.5 mg/dL (ref 8.4–10.5)
CO2: 27 mEq/L (ref 19–32)
Chloride: 95 mEq/L — ABNORMAL LOW (ref 96–112)
Creatinine, Ser: 1.13 mg/dL (ref 0.50–1.35)
GFR calc Af Amer: 70 mL/min — ABNORMAL LOW (ref >90)
GFR, EST NON AFRICAN AMERICAN: 61 mL/min — AB (ref >90)
Glucose, Bld: 249 mg/dL — ABNORMAL HIGH (ref 70–99)
Potassium: 4.7 mEq/L (ref 3.7–5.3)
SODIUM: 135 meq/L — AB (ref 137–147)

## 2013-12-15 LAB — CBC WITH DIFFERENTIAL/PLATELET
BASOS ABS: 0 10*3/uL (ref 0.0–0.1)
Basophils Relative: 0 % (ref 0–1)
EOS ABS: 0.1 10*3/uL (ref 0.0–0.7)
EOS PCT: 0 % (ref 0–5)
HCT: 47.2 % (ref 39.0–52.0)
Hemoglobin: 17 g/dL (ref 13.0–17.0)
LYMPHS PCT: 17 % (ref 12–46)
Lymphs Abs: 1.9 10*3/uL (ref 0.7–4.0)
MCH: 35.7 pg — AB (ref 26.0–34.0)
MCHC: 36 g/dL (ref 30.0–36.0)
MCV: 99.2 fL (ref 78.0–100.0)
Monocytes Absolute: 0.6 10*3/uL (ref 0.1–1.0)
Monocytes Relative: 5 % (ref 3–12)
Neutro Abs: 9 10*3/uL — ABNORMAL HIGH (ref 1.7–7.7)
Neutrophils Relative %: 78 % — ABNORMAL HIGH (ref 43–77)
PLATELETS: 134 10*3/uL — AB (ref 150–400)
RBC: 4.76 MIL/uL (ref 4.22–5.81)
RDW: 13.7 % (ref 11.5–15.5)
WBC: 11.6 10*3/uL — AB (ref 4.0–10.5)

## 2013-12-15 LAB — PROTIME-INR
INR: 1.67 — ABNORMAL HIGH (ref 0.00–1.49)
PROTHROMBIN TIME: 19.7 s — AB (ref 11.6–15.2)

## 2013-12-15 LAB — URINE MICROSCOPIC-ADD ON

## 2013-12-15 MED ORDER — ONDANSETRON HCL 4 MG/2ML IJ SOLN
4.0000 mg | Freq: Once | INTRAMUSCULAR | Status: AC
  Administered 2013-12-15: 4 mg via INTRAVENOUS
  Filled 2013-12-15: qty 2

## 2013-12-15 MED ORDER — ONDANSETRON HCL 4 MG PO TABS: 4.0000 mg | ORAL_TABLET | Freq: Four times a day (QID) | ORAL | Status: DC | PRN

## 2013-12-15 MED ORDER — CEPHALEXIN 500 MG PO CAPS: 500.0000 mg | ORAL_CAPSULE | Freq: Four times a day (QID) | ORAL | Status: DC

## 2013-12-15 MED ORDER — MORPHINE SULFATE 4 MG/ML IJ SOLN
4.0000 mg | Freq: Once | INTRAMUSCULAR | Status: AC
  Administered 2013-12-15: 4 mg via INTRAVENOUS
  Filled 2013-12-15: qty 1

## 2013-12-15 MED ORDER — DEXTROSE 5 % IV SOLN
1.0000 g | Freq: Once | INTRAVENOUS | Status: AC
  Administered 2013-12-15: 1 g via INTRAVENOUS
  Filled 2013-12-15: qty 10

## 2013-12-15 MED ORDER — DIPHENHYDRAMINE HCL 50 MG/ML IJ SOLN: 25.0000 mg | Freq: Once | INTRAMUSCULAR | Status: DC

## 2013-12-15 MED ORDER — OXYCODONE-ACETAMINOPHEN 5-325 MG PO TABS: 1.0000 | ORAL_TABLET | ORAL | Status: DC | PRN

## 2013-12-15 MED ORDER — TAMSULOSIN HCL 0.4 MG PO CAPS: 0.4000 mg | ORAL_CAPSULE | Freq: Every day | ORAL | Status: DC

## 2013-12-15 MED ORDER — DIPHENHYDRAMINE HCL 50 MG/ML IJ SOLN
12.5000 mg | Freq: Once | INTRAMUSCULAR | Status: AC
  Administered 2013-12-15: 12.5 mg via INTRAVENOUS

## 2013-12-15 MED ORDER — DIPHENHYDRAMINE HCL 50 MG/ML IJ SOLN
INTRAMUSCULAR | Status: AC
  Filled 2013-12-15: qty 1

## 2013-12-15 NOTE — Discharge Instructions (Signed)
He would CT scan shows a large stone in the distal right ureter. Once this stone passes into the bladder, you should not have any more problems. However, if pain is not being adequately controlled at home, or a few R. vomiting and not able to hold medication down, or if you run a fever, then returned to the ED for hospital admission.  Kidney Stones Kidney stones (urolithiasis) are deposits that form inside your kidneys. The intense pain is caused by the stone moving through the urinary tract. When the stone moves, the ureter goes into spasm around the stone. The stone is usually passed in the urine.  CAUSES   A disorder that makes certain neck glands produce too much parathyroid hormone (primary hyperparathyroidism).  A buildup of uric acid crystals, similar to gout in your joints.  Narrowing (stricture) of the ureter.  A kidney obstruction present at birth (congenital obstruction).  Previous surgery on the kidney or ureters.  Numerous kidney infections. SYMPTOMS   Feeling sick to your stomach (nauseous).  Throwing up (vomiting).  Blood in the urine (hematuria).  Pain that usually spreads (radiates) to the groin.  Frequency or urgency of urination. DIAGNOSIS   Taking a history and physical exam.  Blood or urine tests.  CT scan.  Occasionally, an examination of the inside of the urinary bladder (cystoscopy) is performed. TREATMENT   Observation.  Increasing your fluid intake.  Extracorporeal shock wave lithotripsy--This is a noninvasive procedure that uses shock waves to break up kidney stones.  Surgery may be needed if you have severe pain or persistent obstruction. There are various surgical procedures. Most of the procedures are performed with the use of small instruments. Only small incisions are needed to accommodate these instruments, so recovery time is minimized. The size, location, and chemical composition are all important variables that will determine the  proper choice of action for you. Talk to your health care provider to better understand your situation so that you will minimize the risk of injury to yourself and your kidney.  HOME CARE INSTRUCTIONS   Drink enough water and fluids to keep your urine clear or pale yellow. This will help you to pass the stone or stone fragments.  Strain all urine through the provided strainer. Keep all particulate matter and stones for your health care provider to see. The stone causing the pain may be as small as a grain of salt. It is very important to use the strainer each and every time you pass your urine. The collection of your stone will allow your health care provider to analyze it and verify that a stone has actually passed. The stone analysis will often identify what you can do to reduce the incidence of recurrences.  Only take over-the-counter or prescription medicines for pain, discomfort, or fever as directed by your health care provider.  Make a follow-up appointment with your health care provider as directed.  Get follow-up X-rays if required. The absence of pain does not always mean that the stone has passed. It may have only stopped moving. If the urine remains completely obstructed, it can cause loss of kidney function or even complete destruction of the kidney. It is your responsibility to make sure X-rays and follow-ups are completed. Ultrasounds of the kidney can show blockages and the status of the kidney. Ultrasounds are not associated with any radiation and can be performed easily in a matter of minutes. SEEK MEDICAL CARE IF:  You experience pain that is progressive and unresponsive to  any pain medicine you have been prescribed. SEEK IMMEDIATE MEDICAL CARE IF:   Pain cannot be controlled with the prescribed medicine.  You have a fever or shaking chills.  The severity or intensity of pain increases over 18 hours and is not relieved by pain medicine.  You develop a new onset of abdominal  pain.  You feel faint or pass out.  You are unable to urinate. MAKE SURE YOU:   Understand these instructions.  Will watch your condition.  Will get help right away if you are not doing well or get worse. Document Released: 03/25/2005 Document Revised: 11/25/2012 Document Reviewed: 08/26/2012 [facility] Patient Information 2015 [place], [place]. This information is not intended to replace advice given to you by your health care provider. Make sure you discuss any questions you have with your health care provider.  Urinary Tract Infection Urinary tract infections (UTIs) can develop anywhere along your urinary tract. Your urinary tract is your body's drainage system for removing wastes and extra water. Your urinary tract includes two kidneys, two ureters, a bladder, and a urethra. Your kidneys are a pair of bean-shaped organs. Each kidney is about the size of your fist. They are located below your ribs, one on each side of your spine. CAUSES Infections are caused by microbes, which are microscopic organisms, including fungi, viruses, and bacteria. These organisms are so small that they can only be seen through a microscope. Bacteria are the microbes that most commonly cause UTIs. SYMPTOMS  Symptoms of UTIs may vary by age and gender of the patient and by the location of the infection. Symptoms in young women typically include a frequent and intense urge to urinate and a painful, burning feeling in the bladder or urethra during urination. Older women and men are more likely to be tired, shaky, and weak and have muscle aches and abdominal pain. A fever may mean the infection is in your kidneys. Other symptoms of a kidney infection include pain in your back or sides below the ribs, nausea, and vomiting. DIAGNOSIS To diagnose a UTI, your caregiver will ask you about your symptoms. Your caregiver also will ask to provide a urine sample. The urine sample will be tested for bacteria and white blood cells.  White blood cells are made by your body to help fight infection. TREATMENT  Typically, UTIs can be treated with medication. Because most UTIs are caused by a bacterial infection, they usually can be treated with the use of antibiotics. The choice of antibiotic and length of treatment depend on your symptoms and the type of bacteria causing your infection. HOME CARE INSTRUCTIONS  If you were prescribed antibiotics, take them exactly as your caregiver instructs you. Finish the medication even if you feel better after you have only taken some of the medication.  Drink enough water and fluids to keep your urine clear or pale yellow.  Avoid caffeine, tea, and carbonated beverages. They tend to irritate your bladder.  Empty your bladder often. Avoid holding urine for long periods of time.  Empty your bladder before and after sexual intercourse.  After a bowel movement, women should cleanse from front to back. Use each tissue only once. SEEK MEDICAL CARE IF:   You have back pain.  You develop a fever.  Your symptoms do not begin to resolve within 3 days. SEEK IMMEDIATE MEDICAL CARE IF:   You have severe back pain or lower abdominal pain.  You develop chills.  You have nausea or vomiting.  You have continued  burning or discomfort with urination. MAKE SURE YOU:   Understand these instructions.  Will watch your condition.  Will get help right away if you are not doing well or get worse. Document Released: 01/02/2005 Document Revised: 09/24/2011 Document Reviewed: 05/03/2011 [facility] Patient Information 2015 [place], [place]. This information is not intended to replace advice given to you by your health care provider. Make sure you discuss any questions you have with your health care provider.  Tamsulosin capsules What is this medicine? TAMSULOSIN (tam SOO loe sin) is used to treat enlargement of the prostate gland in men, a condition called benign prostatic hyperplasia or BPH. It is  not for use in women. It works by relaxing muscles in the prostate and bladder neck. This improves urine flow and reduces BPH symptoms. This medicine may be used for other purposes; ask your health care provider or pharmacist if you have questions. COMMON BRAND NAME(S): Flomax What should I tell my health care provider before I take this medicine? They need to know if you have any of the following conditions: -advanced kidney disease -advanced liver disease -low blood pressure -prostate cancer -an unusual or allergic reaction to tamsulosin, sulfa drugs, other medicines, foods, dyes, or preservatives -pregnant or trying to get pregnant -breast-feeding How should I use this medicine? Take this medicine by mouth about 30 minutes after the same meal every day. Follow the directions on the prescription label. Swallow the capsules whole with a glass of water. Do not crush, chew, or open capsules. Do not take your medicine more often than directed. Do not stop taking your medicine unless your doctor tells you to. Talk to your pediatrician regarding the use of this medicine in children. Special care may be needed. Overdosage: If you think you have taken too much of this medicine contact a poison control center or emergency room at once. NOTE: This medicine is only for you. Do not share this medicine with others. What if I miss a dose? If you miss a dose, take it as soon as you can. If it is almost time for your next dose, take only that dose. Do not take double or extra doses. If you stop taking your medicine for several days or more, ask your doctor or health care professional what dose you should start back on. What may interact with this medicine? -cimetidine -fluoxetine -ketoconazole -medicines for erectile disfunction like sildenafil, tadalafil, vardenafil -medicines for high blood pressure -other alpha-blockers like alfuzosin, doxazosin, phentolamine, phenoxybenzamine, prazosin,  terazosin -warfarin This list may not describe all possible interactions. Give your health care provider a list of all the medicines, herbs, non-prescription drugs, or dietary supplements you use. Also tell them if you smoke, drink alcohol, or use illegal drugs. Some items may interact with your medicine. What should I watch for while using this medicine? Visit your doctor or health care professional for regular check ups. You will need lab work done before you start this medicine and regularly while you are taking it. Check your blood pressure as directed. Ask your health care professional what your blood pressure should be, and when you should contact him or her. This medicine may make you feel dizzy or lightheaded. This is more likely to happen after the first dose, after an increase in dose, or during hot weather or exercise. Drinking alcohol and taking some medicines can make this worse. Do not drive, use machinery, or do anything that needs mental alertness until you know how this medicine affects you. Do not  sit or stand up quickly. If you begin to feel dizzy, sit down until you feel better. These effects can decrease once your body adjusts to the medicine. Contact your doctor or health care professional right away if you have an erection that lasts longer than 4 hours or if it becomes painful. This may be a sign of a serious problem and must be treated right away to prevent permanent damage. If you are thinking of having cataract surgery, tell your eye surgeon that you have taken this medicine. What side effects may I notice from receiving this medicine? Side effects that you should report to your doctor or health care professional as soon as possible: -allergic reactions like skin rash or itching, hives, swelling of the lips, mouth, tongue, or throat -breathing problems -change in vision -feeling faint or lightheaded -irregular heartbeat -prolonged or painful erection -weakness Side effects  that usually do not require medical attention (report to your doctor or health care professional if they continue or are bothersome): -back pain -change in sex drive or performance -constipation, nausea or vomiting -cough -drowsy -runny or stuffy nose -trouble sleeping This list may not describe all possible side effects. Call your doctor for medical advice about side effects. You may report side effects to FDA at 1-800-FDA-1088. Where should I keep my medicine? Keep out of the reach of children. Store at room temperature between 15 and 30 degrees C (59 and 86 degrees F). Throw away any unused medicine after the expiration date. NOTE: This sheet is a summary. It may not cover all possible information. If you have questions about this medicine, talk to your doctor, pharmacist, or health care provider.  2015, [employer] Standard. (2012-03-25 14:11:34)  Acetaminophen; Oxycodone tablets What is this medicine? ACETAMINOPHEN; OXYCODONE (a set a MEE noe fen; ox i KOE done) is a pain reliever. It is used to treat mild to moderate pain. This medicine may be used for other purposes; ask your health care provider or pharmacist if you have questions. COMMON BRAND NAME(S): Endocet, Magnacet, Narvox, Percocet, Perloxx, Primalev, Primlev, Roxicet, Xolox What should I tell my health care provider before I take this medicine? They need to know if you have any of these conditions: -brain tumor -Crohn's disease, inflammatory bowel disease, or ulcerative colitis -drug abuse or addiction -head injury -heart or circulation problems -if you often drink alcohol -kidney disease or problems going to the bathroom -liver disease -lung disease, asthma, or breathing problems -an unusual or allergic reaction to acetaminophen, oxycodone, other opioid analgesics, other medicines, foods, dyes, or preservatives -pregnant or trying to get pregnant -breast-feeding How should I use this medicine? Take this medicine  by mouth with a full glass of water. Follow the directions on the prescription label. Take your medicine at regular intervals. Do not take your medicine more often than directed. Talk to your pediatrician regarding the use of this medicine in children. Special care may be needed. Patients over 78 years old may have a stronger reaction and need a smaller dose. Overdosage: If you think you have taken too much of this medicine contact a poison control center or emergency room at once. NOTE: This medicine is only for you. Do not share this medicine with others. What if I miss a dose? If you miss a dose, take it as soon as you can. If it is almost time for your next dose, take only that dose. Do not take double or extra doses. What may interact with this medicine? -alcohol -antihistamines -barbiturates  like amobarbital, butalbital, butabarbital, methohexital, pentobarbital, phenobarbital, thiopental, and secobarbital -benztropine -drugs for bladder problems like solifenacin, trospium, oxybutynin, tolterodine, hyoscyamine, and methscopolamine -drugs for breathing problems like ipratropium and tiotropium -drugs for certain stomach or intestine problems like propantheline, homatropine methylbromide, glycopyrrolate, atropine, belladonna, and dicyclomine -general anesthetics like etomidate, ketamine, nitrous oxide, propofol, desflurane, enflurane, halothane, isoflurane, and sevoflurane -medicines for depression, anxiety, or psychotic disturbances -medicines for sleep -muscle relaxants -naltrexone -narcotic medicines (opiates) for pain -phenothiazines like perphenazine, thioridazine, chlorpromazine, mesoridazine, fluphenazine, prochlorperazine, promazine, and trifluoperazine -scopolamine -tramadol -trihexyphenidyl This list may not describe all possible interactions. Give your health care provider a list of all the medicines, herbs, non-prescription drugs, or dietary supplements you use. Also tell them  if you smoke, drink alcohol, or use illegal drugs. Some items may interact with your medicine. What should I watch for while using this medicine? Tell your doctor or health care professional if your pain does not go away, if it gets worse, or if you have new or a different type of pain. You may develop tolerance to the medicine. Tolerance means that you will need a higher dose of the medication for pain relief. Tolerance is normal and is expected if you take this medicine for a long time. Do not suddenly stop taking your medicine because you may develop a severe reaction. Your body becomes used to the medicine. This does NOT mean you are addicted. Addiction is a behavior related to getting and using a drug for a non-medical reason. If you have pain, you have a medical reason to take pain medicine. Your doctor will tell you how much medicine to take. If your doctor wants you to stop the medicine, the dose will be slowly lowered over time to avoid any side effects. You may get drowsy or dizzy. Do not drive, use machinery, or do anything that needs mental alertness until you know how this medicine affects you. Do not stand or sit up quickly, especially if you are an older patient. This reduces the risk of dizzy or fainting spells. Alcohol may interfere with the effect of this medicine. Avoid alcoholic drinks. There are different types of narcotic medicines (opiates) for pain. If you take more than one type at the same time, you may have more side effects. Give your health care provider a list of all medicines you use. Your doctor will tell you how much medicine to take. Do not take more medicine than directed. Call emergency for help if you have problems breathing. The medicine will cause constipation. Try to have a bowel movement at least every 2 to 3 days. If you do not have a bowel movement for 3 days, call your doctor or health care professional. Do not take Tylenol (acetaminophen) or medicines that have  acetaminophen with this medicine. Too much acetaminophen can be very dangerous. Many nonprescription medicines contain acetaminophen. Always read the labels carefully to avoid taking more acetaminophen. What side effects may I notice from receiving this medicine? Side effects that you should report to your doctor or health care professional as soon as possible: -allergic reactions like skin rash, itching or hives, swelling of the face, lips, or tongue -breathing difficulties, wheezing -confusion -light headedness or fainting spells -severe stomach pain -unusually weak or tired -yellowing of the skin or the whites of the eyes Side effects that usually do not require medical attention (report to your doctor or health care professional if they continue or are bothersome): -dizziness -drowsiness -nausea -vomiting This list may not  describe all possible side effects. Call your doctor for medical advice about side effects. You may report side effects to FDA at 1-800-FDA-1088. Where should I keep my medicine? Keep out of the reach of children. This medicine can be abused. Keep your medicine in a safe place to protect it from theft. Do not share this medicine with anyone. Selling or giving away this medicine is dangerous and against the law. Store at room temperature between 20 and 25 degrees C (68 and 77 degrees F). Keep container tightly closed. Protect from light. This medicine may cause accidental overdose and death if it is taken by other adults, children, or pets. Flush any unused medicine down the toilet to reduce the chance of harm. Do not use the medicine after the expiration date. NOTE: This sheet is a summary. It may not cover all possible information. If you have questions about this medicine, talk to your doctor, pharmacist, or health care provider.  2015, [employer] Standard. (2012-11-16 13:17:35)  Ondansetron tablets What is this medicine? ONDANSETRON (on DAN se tron) is used to  treat nausea and vomiting caused by chemotherapy. It is also used to prevent or treat nausea and vomiting after surgery. This medicine may be used for other purposes; ask your health care provider or pharmacist if you have questions. COMMON BRAND NAME(S): Zofran What should I tell my health care provider before I take this medicine? They need to know if you have any of these conditions: -heart disease -history of irregular heartbeat -liver disease -low levels of magnesium or potassium in the blood -an unusual or allergic reaction to ondansetron, granisetron, other medicines, foods, dyes, or preservatives -pregnant or trying to get pregnant -breast-feeding How should I use this medicine? Take this medicine by mouth with a glass of water. Follow the directions on your prescription label. Take your doses at regular intervals. Do not take your medicine more often than directed. Talk to your pediatrician regarding the use of this medicine in children. Special care may be needed. Overdosage: If you think you have taken too much of this medicine contact a poison control center or emergency room at once. NOTE: This medicine is only for you. Do not share this medicine with others. What if I miss a dose? If you miss a dose, take it as soon as you can. If it is almost time for your next dose, take only that dose. Do not take double or extra doses. What may interact with this medicine? Do not take this medicine with any of the following medications: -apomorphine -certain medicines for fungal infections like fluconazole, itraconazole, ketoconazole, posaconazole, voriconazole -cisapride -dofetilide -dronedarone -pimozide -thioridazine -ziprasidone This medicine may also interact with the following medications: -carbamazepine -certain medicines for depression, anxiety, or psychotic disturbances -fentanyl -linezolid -MAOIs like Carbex, Eldepryl, Marplan, Nardil, and Parnate -methylene blue  (injected into a vein) -other medicines that prolong the QT interval (cause an abnormal heart rhythm) -phenytoin -rifampicin -tramadol This list may not describe all possible interactions. Give your health care provider a list of all the medicines, herbs, non-prescription drugs, or dietary supplements you use. Also tell them if you smoke, drink alcohol, or use illegal drugs. Some items may interact with your medicine. What should I watch for while using this medicine? Check with your doctor or health care professional right away if you have any sign of an allergic reaction. What side effects may I notice from receiving this medicine? Side effects that you should report to your doctor or health care  professional as soon as possible: -allergic reactions like skin rash, itching or hives, swelling of the face, lips or tongue -breathing problems -confusion -dizziness -fast or irregular heartbeat -feeling faint or lightheaded, falls -fever and chills -loss of balance or coordination -seizures -sweating -swelling of the hands or feet -tightness in the chest -tremors -unusually weak or tired Side effects that usually do not require medical attention (report to your doctor or health care professional if they continue or are bothersome): -constipation or diarrhea -headache This list may not describe all possible side effects. Call your doctor for medical advice about side effects. You may report side effects to FDA at 1-800-FDA-1088. Where should I keep my medicine? Keep out of the reach of children. Store between 2 and 30 degrees C (36 and 86 degrees F). Throw away any unused medicine after the expiration date. NOTE: This sheet is a summary. It may not cover all possible information. If you have questions about this medicine, talk to your doctor, pharmacist, or health care provider.  2015, [employer] Standard. (2012-12-30 16:27:45)  Cephalexin tablets or capsules What is this  medicine? CEPHALEXIN (sef a LEX in) is a cephalosporin antibiotic. It is used to treat certain kinds of bacterial infections It will not work for colds, flu, or other viral infections. This medicine may be used for other purposes; ask your health care provider or pharmacist if you have questions. COMMON BRAND NAME(S): Biocef, Keflex, Keftab What should I tell my health care provider before I take this medicine? They need to know if you have any of these conditions: -kidney disease -stomach or intestine problems, especially colitis -an unusual or allergic reaction to cephalexin, other cephalosporins, penicillins, other antibiotics, medicines, foods, dyes or preservatives -pregnant or trying to get pregnant -breast-feeding How should I use this medicine? Take this medicine by mouth with a full glass of water. Follow the directions on the prescription label. This medicine can be taken with or without food. Take your medicine at regular intervals. Do not take your medicine more often than directed. Take all of your medicine as directed even if you think you are better. Do not skip doses or stop your medicine early. Talk to your pediatrician regarding the use of this medicine in children. While this drug may be prescribed for selected conditions, precautions do apply. Overdosage: If you think you have taken too much of this medicine contact a poison control center or emergency room at once. NOTE: This medicine is only for you. Do not share this medicine with others. What if I miss a dose? If you miss a dose, take it as soon as you can. If it is almost time for your next dose, take only that dose. Do not take double or extra doses. There should be at least 4 to 6 hours between doses. What may interact with this medicine? -probenecid -some other antibiotics This list may not describe all possible interactions. Give your health care provider a list of all the medicines, herbs, non-prescription drugs, or  dietary supplements you use. Also tell them if you smoke, drink alcohol, or use illegal drugs. Some items may interact with your medicine. What should I watch for while using this medicine? Tell your doctor or health care professional if your symptoms do not begin to improve in a few days. Do not treat diarrhea with over the counter products. Contact your doctor if you have diarrhea that lasts more than 2 days or if it is severe and watery. If you have diabetes,  you may get a false-positive result for sugar in your urine. Check with your doctor or health care professional. What side effects may I notice from receiving this medicine? Side effects that you should report to your doctor or health care professional as soon as possible: -allergic reactions like skin rash, itching or hives, swelling of the face, lips, or tongue -breathing problems -pain or trouble passing urine -redness, blistering, peeling or loosening of the skin, including inside the mouth -severe or watery diarrhea -unusually weak or tired -yellowing of the eyes, skin Side effects that usually do not require medical attention (report to your doctor or health care professional if they continue or are bothersome): -gas or heartburn -genital or anal irritation -headache -joint or muscle pain -nausea, vomiting This list may not describe all possible side effects. Call your doctor for medical advice about side effects. You may report side effects to FDA at 1-800-FDA-1088. Where should I keep my medicine? Keep out of the reach of children. Store at room temperature between 59 and 86 degrees F (15 and 30 degrees C). Throw away any unused medicine after the expiration date. NOTE: This sheet is a summary. It may not cover all possible information. If you have questions about this medicine, talk to your doctor, pharmacist, or health care provider.  2015, [employer]. (2007-06-29 17:09:13)

## 2013-12-15 NOTE — ED Provider Notes (Signed)
CSN: [id number]     Arrival date & time 12/14/13  2345 History   First MD Initiated Contact with Patient 12/15/13 0133     Chief Complaint  Patient presents with  . Groin Pain     (Consider location/radiation/quality/duration/timing/severity/associated sxs/prior Treatment) Patient is a 79 y.o. male presenting with groin pain. The history is provided by the patient.  Groin Pain  He had onset at about 9 PM of severe pain in his scrotal area without radiation. There is associated nausea but no vomiting. He is noted some tenderness in the suprapubic area. He has noted a sense of urinary urgency but is only able to get a small amount of urine out. He rates pain a 10/10. Nothing makes it better nothing makes it worse. He denies fever, chills, sweats. No constipation or diarrhea.  Past Medical History  Diagnosis Date  . Arteriosclerotic cardiovascular disease (ASCVD)   . COPD (chronic obstructive pulmonary disease)   . Gout   . DJD (degenerative joint disease)   . Hyperlipidemia   . Chronic anticoagulation 2012    2012  . CHF (congestive heart failure)   . Atrial fibrillation     Onset in 2012  . Atrial flutter   . ICD (implantable cardiac defibrillator) in place   . Myocardial infarction 1998  . Ischemic cardiomyopathy   . Obstructive sleep apnea     "went away when I lost a bunch of weight" (08/17/2012)  . Type II diabetes mellitus   . Kidney stone     "just once" (08/17/2012)  . NICM (nonischemic cardiomyopathy), EF 25-30% 2012/08/29  . At risk for sudden cardiac death Aug 29, 2012  . Permanent atrial fibrillation 02/15/2011    Initial onset in 03/2011 with rapid ventricular response   . S/P ICD (internal cardiac defibrillator) procedure, 08/17/12, [facility] Aug 29, 2012    [facility]   Past Surgical History  Procedure Laterality Date  . Cholecystectomy  2009  . Knee arthroplasty Left 1978    "tendon & cartilege repair" (08/17/2012)  . Vasectomy  ~ 1964  .  Cystoscopy/retrograde/ureteroscopy  06/28/2011    Procedure: CYSTOSCOPY/RETROGRADE/URETEROSCOPY;  Surgeon: Ky Barban, MD;  Location: [facility];  Service: Urology;  Laterality: Right;  . Stone extraction with basket  06/28/2011    Procedure: STONE EXTRACTION WITH BASKET;  Surgeon: Ky Barban, MD;  Location: [facility];  Service: Urology;  Laterality: Right;  specimen given to family per MD  . Cardiac defibrillator placement  08/17/2012    [facility]  . Coronary angioplasty with stent placement  07/14/1996    "1" (08/17/2012)  . Cataract extraction w/ intraocular lens  implant, bilateral Bilateral ~ 2011  . US echocardiography  07/08/2012    EF <20%,mild MR,TR,LA severely dilated  . Myoview perfusion scan  06/02/2012    low risk, extensive scar entire LAD & RCA territory  . S/p icd  08/2012    [facility]   Family History  Problem Relation Age of Onset  . Heart failure Mother   . Heart failure Father    History  Substance Use Topics  . Smoking status: Former Smoker -- 2.00 packs/day for 40 years    Types: Cigarettes    Quit date: 04/08/1992  . Smokeless tobacco: Former Neurosurgeon  . Alcohol Use: No     Comment: 08/17/2012 "quit drinking in 1983"    Review of Systems  All other systems reviewed and are negative.     Allergies  Lipitor; Procaine hcl; and Tramadol  Home  Medications   Prior to Admission medications   Medication Sig Start Date End Date Taking? Authorizing Provider  carvedilol (COREG) 12.5 MG tablet Take 12.5 mg by mouth 2 (two) times daily with a meal.    Historical Provider, MD  digoxin (LANOXIN) 0.25 MG tablet Take 1 tablet (0.25 mg total) by mouth daily. 02/28/12 11/25/13  Freeman Caldron, PA-C  furosemide (LASIX) 40 MG tablet Take 1 tablet (40 mg total) by mouth daily. 07/12/13   Lennette Bihari, MD  magnesium oxide (MAG-OX) 400 MG tablet Take 400 mg by mouth 2 (two) times daily.      Historical Provider, MD  metFORMIN (GLUCOPHAGE) 500 MG tablet Take 500 mg by  mouth 2 (two) times daily with a meal.     Historical Provider, MD  Omega-3 Fatty Acids (FISH OIL BURP-LESS) 1000 MG CAPS Take 1,200 mg by mouth 2 (two) times daily.     Historical Provider, MD  oxyCODONE-acetaminophen (PERCOCET/ROXICET) 5-325 MG per tablet Take 1 tablet by mouth every 4 (four) hours as needed for severe pain. 08/02/13   Burgess Amor, PA-C  pravastatin (PRAVACHOL) 40 MG tablet Take 40 mg by mouth daily.    Historical Provider, MD  Tamsulosin HCl (FLOMAX) 0.4 MG CAPS Take 0.4 mg by mouth daily after breakfast.    Historical Provider, MD  warfarin (COUMADIN) 4 MG tablet Take 2-4 mg by mouth See admin instructions. Takes  for two days, then  (1 tablet) one day.    Historical Provider, MD   BP 139/89  Pulse 73  Resp 19  Ht  (1.651 m)  Wt 174 lb (78.926 kg)  BMI 28.96 kg/m2  SpO2 98% Physical Exam  Nursing note and vitals reviewed.  79 year old male, resting comfortably and in no acute distress. Vital signs are normal. Oxygen saturation is 98%, which is normal. Head is normocephalic and atraumatic. PERRLA, EOMI. Oropharynx is clear. Neck is nontender and supple without adenopathy or JVD. Back is nontender and there is no CVA tenderness. Lungs are clear without rales, wheezes, or rhonchi. Chest is nontender. Heart has regular rate and rhythm without murmur. Abdomen is soft, flat, with mild suprapubic tenderness. Bladder does not appear to be distended. There is no rebound or guarding. There are no masses or hepatosplenomegaly and peristalsis is normoactive. Genitalia: Uncircumcised penis, testes descended without masses or tenderness. No hernias identified but there is tenderness to palpation of the right inguinal canal. No tenderness in the left inguinal canal. Extremities have no cyanosis or edema, full range of motion is present. Skin is warm and dry without rash. Neurologic: Mental status is normal, cranial nerves are intact, there are no motor or sensory  deficits.  ED Course  Procedures (including critical care time) Labs Review Results for orders placed during the hospital encounter of 12/15/13  URINALYSIS, ROUTINE W REFLEX MICROSCOPIC      Result Value Ref Range   Color, Urine YELLOW  YELLOW   APPearance CLEAR  CLEAR   Specific Gravity, Urine >1.030 (*) 1.005 - 1.030   pH 5.0  5.0 - 8.0   Glucose, UA NEGATIVE  NEGATIVE mg/dL   Hgb urine dipstick LARGE (*) NEGATIVE   Bilirubin Urine SMALL (*) NEGATIVE   Ketones, ur TRACE (*) NEGATIVE mg/dL   Protein, ur 30 (*) NEGATIVE mg/dL   Urobilinogen, UA 1.0  0.0 - 1.0 mg/dL   Nitrite NEGATIVE  NEGATIVE   Leukocytes, UA NEGATIVE  NEGATIVE  URINE MICROSCOPIC-ADD ON  Result Value Ref Range   Squamous Epithelial / LPF FEW (*) RARE   WBC, UA 0-2  <3 WBC/hpf   RBC / HPF 3-6  <3 RBC/hpf   Bacteria, UA MANY (*) RARE   Urine-Other MICROSCOPIC EXAM PERFORMED ON UNCONCENTRATED URINE    CBC WITH DIFFERENTIAL      Result Value Ref Range   WBC 11.6 (*) 4.0 - 10.5 K/uL   RBC 4.76  4.22 - 5.81 MIL/uL   Hemoglobin 17.0  13.0 - 17.0 g/dL   HCT 16.1  09.6 - 04.5 %   MCV 99.2  78.0 - 100.0 fL   MCH 35.7 (*) 26.0 - 34.0 pg   MCHC 36.0  30.0 - 36.0 g/dL   RDW 40.9  81.1 - 91.4 %   Platelets 134 (*) 150 - 400 K/uL   Neutrophils Relative % 78 (*) 43 - 77 %   Neutro Abs 9.0 (*) 1.7 - 7.7 K/uL   Lymphocytes Relative 17  12 - 46 %   Lymphs Abs 1.9  0.7 - 4.0 K/uL   Monocytes Relative 5  3 - 12 %   Monocytes Absolute 0.6  0.1 - 1.0 K/uL   Eosinophils Relative 0  0 - 5 %   Eosinophils Absolute 0.1  0.0 - 0.7 K/uL   Basophils Relative 0  0 - 1 %   Basophils Absolute 0.0  0.0 - 0.1 K/uL  BASIC METABOLIC PANEL      Result Value Ref Range   Sodium 135 (*) 137 - 147 mEq/L   Potassium 4.7  3.7 - 5.3 mEq/L   Chloride 95 (*) 96 - 112 mEq/L   CO2 27  19 - 32 mEq/L   Glucose, Bld 249 (*) 70 - 99 mg/dL   BUN 15  6 - 23 mg/dL   Creatinine, Ser 7.82  0.50 - 1.35 mg/dL   Calcium 9.5  8.4 - 95.6 mg/dL   GFR  calc non Af Amer 61 (*) >90 mL/min   GFR calc Af Amer 70 (*) >90 mL/min   Anion gap 13  5 - 15  PROTIME-INR      Result Value Ref Range   Prothrombin Time 19.7 (*) 11.6 - 15.2 seconds   INR 1.67 (*) 0.00 - 1.49   Imaging Review Ct Abdomen Pelvis Wo Contrast  12/15/2013   CLINICAL DATA:  Bilateral lower abdominal pain, kidney stones  EXAM: CT ABDOMEN AND PELVIS WITHOUT CONTRAST  TECHNIQUE: Multidetector CT imaging of the abdomen and pelvis was performed following the standard protocol without IV contrast.  COMPARISON:  06/24/2011  FINDINGS: Lung bases are essentially clear.  Cardiomegaly.  ICD leads, incompletely visualized.  Mild hepatic steatosis.  Unenhanced spleen, pancreas, and adrenal glands are within normal limits.  Status post cholecystectomy. No intrahepatic or extrahepatic ductal dilatation.  Two nonobstructing left renal calculi measuring up to 7 mm in the left lower pole (series 2/ image 37). No hydronephrosis.  Moderate right hydronephrosis with surrounding nonspecific perinephric stranding.  No evidence of bowel obstruction. Normal appendix. Extensive colonic diverticulosis, without associated inflammatory changes.  Atherosclerotic calcifications of the abdominal aorta and branch vessels.  No abdominopelvic ascites.  No suspicious abdominopelvic lymphadenopathy.  Prostatomegaly, with enlargement of the central gland which indents the base of the bladder.  10 mm distal right ureteral calculus at the UVJ (series 2/ image 75). Bladder is mildly thick-walled but underdistended.  Degenerative changes of the visualized thoracolumbar spine.  IMPRESSION: 10 mm distal right ureteral calculus at the  UVJ. Moderate right hydronephrosis.  Two nonobstructing left renal calculi measuring up to 7 mm.   Electronically Signed   By: Charline Bills M.D.   On: 12/15/2013 02:52   Images viewed by me.   MDM   Final diagnoses:  Ureterolithiasis  Urinary tract infection with hematuria, site unspecified     Scrotal pain of uncertain cause. Urinalysis has come back showing definite signs of urinary tract infection. Old records are reviewed and he does have known kidney stones he will be sent for CT scan to evaluate whether they may be the cause of his pain. Symptoms may be purely related to UTI. He is started on ceftriaxone.  CT scan shows a large stone in the distal right ureter and it appears to be almost into the bladder. He had good pain relief with the initial dose of morphine and ondansetron. He is going to come back and he is given a second dose of morphine. He is discharged with prescriptions for oxycodone and acetaminophen, ondansetron, tamsulosin, and cephalexin. He is referred to urology for followup.  Dione Booze, MD 12/15/13 [phone]

## 2013-12-16 LAB — URINE CULTURE: Colony Count: 7000

## 2013-12-20 MED FILL — Ondansetron HCl Tab 4 MG: ORAL | Qty: 4 | Status: AC

## 2013-12-20 MED FILL — Oxycodone w/ Acetaminophen Tab 5-325 MG: ORAL | Qty: 6 | Status: AC

## 2013-12-24 ENCOUNTER — Ambulatory Visit: Payer: Medicare HMO | Admitting: Urology

## 2014-01-04 ENCOUNTER — Encounter: Payer: Self-pay | Admitting: Cardiovascular Disease

## 2014-01-04 ENCOUNTER — Ambulatory Visit (INDEPENDENT_AMBULATORY_CARE_PROVIDER_SITE_OTHER): Payer: Medicare HMO | Admitting: Cardiovascular Disease

## 2014-01-04 VITALS — BP 124/83 | HR 78 | Ht 65.0 in | Wt 175.1 lb

## 2014-01-04 DIAGNOSIS — I4821 Permanent atrial fibrillation: Secondary | ICD-10-CM

## 2014-01-04 DIAGNOSIS — I509 Heart failure, unspecified: Secondary | ICD-10-CM

## 2014-01-04 DIAGNOSIS — E119 Type 2 diabetes mellitus without complications: Secondary | ICD-10-CM

## 2014-01-04 DIAGNOSIS — Z95810 Presence of automatic (implantable) cardiac defibrillator: Secondary | ICD-10-CM

## 2014-01-04 DIAGNOSIS — I2510 Atherosclerotic heart disease of native coronary artery without angina pectoris: Secondary | ICD-10-CM

## 2014-01-04 DIAGNOSIS — I2589 Other forms of chronic ischemic heart disease: Secondary | ICD-10-CM

## 2014-01-04 DIAGNOSIS — I4891 Unspecified atrial fibrillation: Secondary | ICD-10-CM

## 2014-01-04 DIAGNOSIS — I255 Ischemic cardiomyopathy: Secondary | ICD-10-CM

## 2014-01-04 DIAGNOSIS — I5042 Chronic combined systolic (congestive) and diastolic (congestive) heart failure: Secondary | ICD-10-CM

## 2014-01-04 DIAGNOSIS — G4733 Obstructive sleep apnea (adult) (pediatric): Secondary | ICD-10-CM

## 2014-01-04 DIAGNOSIS — Z7901 Long term (current) use of anticoagulants: Secondary | ICD-10-CM

## 2014-01-04 NOTE — Patient Instructions (Signed)
Your physician wants you to follow-up in: 6 months. You will receive a reminder letter in the mail two months in advance. If you don't receive a letter, please call our office to schedule the follow-up appointment. No changes have been made today in your therapy.

## 2014-01-05 ENCOUNTER — Encounter: Payer: Self-pay | Admitting: Cardiovascular Disease

## 2014-01-05 DIAGNOSIS — I2510 Atherosclerotic heart disease of native coronary artery without angina pectoris: Secondary | ICD-10-CM | POA: Insufficient documentation

## 2014-01-05 NOTE — Progress Notes (Signed)
Patient ID: [name], male   DOB: November 26, 1934, 79 y.o.   MRN: [id number]     HPI: [name] is a 79 y.o. male who presents to the office for a 29-month cardiology evaluation.  [name] has a history of an ischemic cardiomyopathy and in April 1998 suffered a large anterior wall myocardial infarction and underwent intervention to the LAD. In April 1999 a stent was placed to the LAD and he had PTCA of his circumflex vessel. In November 2004 he underwent stenting of his RCA.  I had not seen him for a 7 year period but ultimately he presented to me in January 2014. Prior to that, he had been hospitalized after a fall and developed rib fractures and a mild subarachnoid hemorrhage. He also was found to be in permanent atrial fibrillation and had been on Coumadin therapy. When I saw him in February 2014 and nuclear study showed extensive scar entire LAD territory as well as a large portion of the RCA territory with minimal borderline ischemia. Ejection fraction was less than 20% on echo Doppler study in April 2014 consequently I recommended he undergo an ICD implantation. This was done by [name] in May 2014.  Additional problems include hypertension, peripheral edema, Coumadin anticoagulation, and hyperlipidemia. He also has obesity. He has a history of obstructive sleep apnea and is on CPAP therapy.  He does admit to frequent having ecchymoses in his right upper arm. He has purposely lost weight from a maximum weight of 245 to approximately 190.  Since I last saw him, he denies any recent episodes of chest pressure.  He is unaware of any defibrillator discharge.  He denies presyncope or syncope.  He saw Dr. Royann [name] on 11/25/2013 for ICD evaluation.  Interrogation of his device showed normal function.  He has permanent atrial fibrillation and is on warfarin anticoagulation.   Past Medical History  Diagnosis Date  . Arteriosclerotic cardiovascular disease (ASCVD)   . COPD (chronic obstructive  pulmonary disease)   . Gout   . DJD (degenerative joint disease)   . Hyperlipidemia   . Chronic anticoagulation 2012    2012  . CHF (congestive heart failure)   . Atrial fibrillation     Onset in 2012  . Atrial flutter   . ICD (implantable cardiac defibrillator) in place   . Myocardial infarction 1998  . Ischemic cardiomyopathy   . Obstructive sleep apnea     "went away when I lost a bunch of weight" (08/17/2012)  . Type II diabetes mellitus   . Kidney stone     "just once" (08/17/2012)  . NICM (nonischemic cardiomyopathy), EF 25-30% 02-Sep-2012  . At risk for sudden cardiac death Sep 02, 2012  . Permanent atrial fibrillation 02/15/2011    Initial onset in 03/2011 with rapid ventricular response   . S/P ICD (internal cardiac defibrillator) procedure, 08/17/12, [facility] Sep 02, 2012    [facility]    Past Surgical History  Procedure Laterality Date  . Cholecystectomy  2009  . Knee arthroplasty Left 1978    "tendon & cartilege repair" (08/17/2012)  . Vasectomy  ~ 1964  . Cystoscopy/retrograde/ureteroscopy  06/28/2011    Procedure: CYSTOSCOPY/RETROGRADE/URETEROSCOPY;  Surgeon: [name] Barban, MD;  Location: [facility];  Service: Urology;  Laterality: Right;  . Stone extraction with basket  06/28/2011    Procedure: STONE EXTRACTION WITH BASKET;  Surgeon: [name] Barban, MD;  Location: [facility];  Service: Urology;  Laterality: Right;  specimen given to family per MD  .  Cardiac defibrillator placement  08/17/2012    [facility]  . Coronary angioplasty with stent placement  07/14/1996    "1" (08/17/2012)  . Cataract extraction w/ intraocular lens  implant, bilateral Bilateral ~ 2011  . US echocardiography  07/08/2012    EF <20%,mild MR,TR,LA severely dilated  . Myoview perfusion scan  06/02/2012    low risk, extensive scar entire LAD & RCA territory  . S/p icd  08/2012    [facility]    Allergies  Allergen Reactions  . Lipitor [Atorvastatin] Other (See Comments)    myalgias     . Procaine Hcl Nausea And Vomiting  . Tramadol Nausea And Vomiting    Current Outpatient Prescriptions  Medication Sig Dispense Refill  . carvedilol (COREG) 12.5 MG tablet Take 12.5 mg by mouth 2 (two) times daily with a meal.      . furosemide (LASIX) 40 MG tablet Take 1 tablet (40 mg total) by mouth daily.  30 tablet  10  . magnesium oxide (MAG-OX) 400 MG tablet Take 400 mg by mouth 2 (two) times daily.        . metFORMIN (GLUCOPHAGE) 500 MG tablet Take 500 mg by mouth 2 (two) times daily with a meal.       . Omega-3 Fatty Acids (FISH OIL BURP-LESS) 1000 MG CAPS Take 1,200 mg by mouth 2 (two) times daily.       . ondansetron (ZOFRAN) 4 MG tablet Take 1 tablet (4 mg total) by mouth every 6 (six) hours as needed for nausea.  4 tablet  0  . ondansetron (ZOFRAN) 4 MG tablet Take 1 tablet (4 mg total) by mouth every 6 (six) hours as needed for nausea.  12 tablet  0  . ONE TOUCH ULTRA TEST test strip       . oxyCODONE-acetaminophen (PERCOCET/ROXICET) 5-325 MG per tablet Take 1 tablet by mouth every 4 (four) hours as needed for severe pain.  20 tablet  0  . oxyCODONE-acetaminophen (PERCOCET/ROXICET) 5-325 MG per tablet Take 1 tablet by mouth every 4 (four) hours as needed.  6 tablet  0  . oxyCODONE-acetaminophen (PERCOCET/ROXICET) 5-325 MG per tablet Take 1 tablet by mouth every 4 (four) hours as needed.  20 tablet  0  . pravastatin (PRAVACHOL) 40 MG tablet Take 40 mg by mouth daily.      . tamsulosin (FLOMAX) 0.4 MG CAPS capsule Take 1 capsule (0.4 mg total) by mouth daily.  10 capsule  0  . Tamsulosin HCl (FLOMAX) 0.4 MG CAPS Take 0.4 mg by mouth daily after breakfast.      . warfarin (COUMADIN) 4 MG tablet Take 2-4 mg by mouth See admin instructions. Takes 2mg  for two days, then 4mg  (1 tablet) one day.      . digoxin (LANOXIN) 0.25 MG tablet Take 1 tablet (0.25 mg total) by mouth daily.  30 tablet  3   No current facility-administered medications for this visit.    History   Social  History  . Marital Status: Married    Spouse Name: N/A    Number of Children: N/A  . Years of Education: N/A   Occupational History  . Not on file.   Social History Main Topics  . Smoking status: Former Smoker -- 2.00 packs/day for 40 years    Types: Cigarettes    Quit date: 04/08/1992  . Smokeless tobacco: Former Neurosurgeon  . Alcohol Use: No     Comment: 08/17/2012 "quit drinking in 1983"  . Drug Use:  No  . Sexual Activity: No   Other Topics Concern  . Not on file   Social History Narrative  . No narrative on file    Family History  Problem Relation Age of Onset  . Heart failure Mother   . Heart failure Father     ROS General: Negative; No fevers, chills, or night sweats; purposeful weight loss from 245 pounds to 175 pounds. HEENT: Negative; No changes in vision or hearing, sinus congestion, difficulty swallowing Pulmonary: Negative; No cough, wheezing, shortness of breath, hemoptysis Cardiovascular: Negative; No chest pain, presyncope, syncope, palpitations GI: Negative; No nausea, vomiting, diarrhea, or abdominal pain GU: Positive for kidney stone; No dysuria, hematuria, or difficulty voiding Musculoskeletal: Negative; no myalgias, joint pain, or weakness Hematologic/Oncology: Negative; no easy bruising, bleeding Endocrine: Positive for diabetes mellitus; no heat/cold intolerance; no diabetes Neuro: Negative; no changes in balance, headaches Skin: Negative; No rashes or skin lesions Psychiatric: Negative; No behavioral problems, depression Sleep: Positive for sleep apnea on CPAP therapy.; No snoring, daytime sleepiness, hypersomnolence, bruxism, restless legs, hypnogognic hallucinations, no cataplexy Other comprehensive 14 point system review is negative.   PE BP 124/83  Pulse 78  Ht 5\' 5"  (1.651 m)  Wt 175 lb 1.6 oz (79.425 kg)  BMI 29.14 kg/m2  General: Alert, oriented, no distress.  Skin: normal turgor, no rashes; area of ecchymosis in the right upper  arm HEENT: Normocephalic, atraumatic. Pupils round and reactive; sclera anicteric;no lid lag.  Nose without nasal septal hypertrophy Mouth/Parynx benign; Mallinpatti scale 3; mild arterial narrowing without hemorrhages or exudate Neck: No JVD, no carotid bruits with normal carotid upstroke Lungs: clear to ausculatation and percussion; no wheezing or rales Chest wall: no tenderness to palpitation Heart: RRR, s1 s2 normal 1/6 systolic murmur; no S3 gallop.  No diastolic murmur, rubs, thrills or heaves. Abdomen: soft, nontender; no hepatosplenomehaly, BS+; abdominal aorta nontender and not dilated by palpation. Back: no CVA tenderness Pulses 2+ Extremities:  Trace edema, significantly improved in his edema in his lower extremity,  no clubbing cyanosis, Homan's sign negative  Neurologic: grossly nonfocal Psychologic: normal affect and mood.  ECG (independently read by me): Permanent atrial fibrillation with a ventricular rate of 78 beats per minute.  Nonspecific ST-T changes.  Appropriate pacing.  Prior ECG: Underlying atrial fibrillation with appropriate Ventricular pacing and sensing.  LABS:  BMET    Component Value Date/Time   NA 135* 12/15/2013 0253   K 4.7 12/15/2013 0253   CL 95* 12/15/2013 0253   CO2 27 12/15/2013 0253   GLUCOSE 249* 12/15/2013 0253   BUN 15 12/15/2013 0253   CREATININE 1.13 12/15/2013 0253   CREATININE 1.07 04/10/2012 1205   CALCIUM 9.5 12/15/2013 0253   GFRNONAA 61* 12/15/2013 0253   GFRAA 70* 12/15/2013 0253     Hepatic Function Panel     Component Value Date/Time   PROT 6.7 03/26/2012 0515   ALBUMIN 3.8 03/26/2012 0515   AST 19 03/26/2012 0515   ALT 14 03/26/2012 0515   ALKPHOS 76 03/26/2012 0515   BILITOT 0.6 03/26/2012 0515   BILIDIR 0.2 03/26/2012 0515   IBILI 0.4 03/26/2012 0515     CBC    Component Value Date/Time   WBC 11.6* 12/15/2013 0253   RBC 4.76 12/15/2013 0253   HGB 17.0 12/15/2013 0253   HCT 47.2 12/15/2013 0253   PLT 134* 12/15/2013 0253   MCV 99.2  12/15/2013 0253   MCH 35.7* 12/15/2013 0253   MCHC 36.0 12/15/2013 0253   RDW  13.7 12/15/2013 0253   LYMPHSABS 1.9 12/15/2013 0253   MONOABS 0.6 12/15/2013 0253   EOSABS 0.1 12/15/2013 0253   BASOSABS 0.0 12/15/2013 0253     BNP    Component Value Date/Time   PROBNP 3742.0* 03/25/2012 0513    Lipid Panel     Component Value Date/Time   CHOL 117 02/15/2011 0824   TRIG 155* 02/15/2011 0824   HDL 38* 02/15/2011 0824   CHOLHDL 3.1 02/15/2011 0824   VLDL 31 02/15/2011 0824   LDLCALC 48 02/15/2011 0824     RADIOLOGY: No results found.    ASSESSMENT AND PLAN: ShawnTroia is a very pleasant 79 year old gentleman who has a history of an ischemic cardiomyopathy and suffered his initial anterior wall myocardial infarction in 1998. He has subsequently developed permanent atrial fibrillation and  underwent ICD implantation by Dr. Royann Shiversroitoru with a single chamber ICD for primary prevention per Maditt II criteria .  He was documented to have severe ischemic heart myopathy and ejection fraction of 20%.. Presently, he is doing well.  When I last saw him, he has significant lower extremity edema, and this has significantly improved with furosemide.  His blood pressure is well-controlled.  He has lost approximately 70 pounds purposefully and feels significantly better.  His current weight now it takes him under the "Obesity.  Threshold "with a body mass index of 29.1. He continues to be on pravastatin for lipid lowering therapy. He is compensated without CHF symptoms.  His atrial fibrillation, ventricular rate is well controlled on his current dose of carvedilol 12.5 mg twice a day and digoxin I commended him on his continued weight loss. He is on Coumadin anticoagulation.  He has a kidney stone that has been documented, but he has not yet passed this.  He has diabetes mellitus and continues on metformin 500 mg twice a day.  I did review recent laboratory from 12/15/2013.  At that time.  His glucose was elevated.  He will  continue with his current medical regimen .  Blood glucose continues to be elevated, dose adjustment of his diabetic regimen will be necessary.   Lennette Biharihomas A. Eshaan Titzer, MD, [facility] HospitalFACC  01/05/2014 7:59 AM

## 2014-01-28 ENCOUNTER — Ambulatory Visit: Payer: Medicare HMO | Admitting: Urology

## 2014-03-17 ENCOUNTER — Encounter (HOSPITAL_COMMUNITY): Payer: Self-pay | Admitting: Cardiovascular Disease

## 2014-04-05 ENCOUNTER — Inpatient Hospital Stay (HOSPITAL_COMMUNITY)
Admission: EM | Admit: 2014-04-05 | Discharge: 2014-04-07 | DRG: 872 | Disposition: A | Discharge status: Home / Self Care | Payer: Medicare HMO | Attending: Family Medicine | Admitting: Family Medicine

## 2014-04-05 ENCOUNTER — Encounter (HOSPITAL_COMMUNITY): Payer: Self-pay | Admitting: *Deleted

## 2014-04-05 ENCOUNTER — Emergency Department (HOSPITAL_COMMUNITY): Payer: Medicare HMO

## 2014-04-05 DIAGNOSIS — Z9189 Other specified personal risk factors, not elsewhere classified: Secondary | ICD-10-CM

## 2014-04-05 DIAGNOSIS — E119 Type 2 diabetes mellitus without complications: Secondary | ICD-10-CM | POA: Diagnosis present

## 2014-04-05 DIAGNOSIS — I2510 Atherosclerotic heart disease of native coronary artery without angina pectoris: Secondary | ICD-10-CM | POA: Diagnosis present

## 2014-04-05 DIAGNOSIS — J449 Chronic obstructive pulmonary disease, unspecified: Secondary | ICD-10-CM | POA: Diagnosis present

## 2014-04-05 DIAGNOSIS — E785 Hyperlipidemia, unspecified: Secondary | ICD-10-CM | POA: Diagnosis present

## 2014-04-05 DIAGNOSIS — R05 Cough: Secondary | ICD-10-CM | POA: Diagnosis not present

## 2014-04-05 DIAGNOSIS — I10 Essential (primary) hypertension: Secondary | ICD-10-CM | POA: Diagnosis present

## 2014-04-05 DIAGNOSIS — Z9861 Coronary angioplasty status: Secondary | ICD-10-CM

## 2014-04-05 DIAGNOSIS — I059 Rheumatic mitral valve disease, unspecified: Secondary | ICD-10-CM

## 2014-04-05 DIAGNOSIS — I429 Cardiomyopathy, unspecified: Secondary | ICD-10-CM

## 2014-04-05 DIAGNOSIS — Z95810 Presence of automatic (implantable) cardiac defibrillator: Secondary | ICD-10-CM | POA: Diagnosis not present

## 2014-04-05 DIAGNOSIS — G4733 Obstructive sleep apnea (adult) (pediatric): Secondary | ICD-10-CM | POA: Diagnosis present

## 2014-04-05 DIAGNOSIS — I249 Acute ischemic heart disease, unspecified: Secondary | ICD-10-CM | POA: Diagnosis present

## 2014-04-05 DIAGNOSIS — I252 Old myocardial infarction: Secondary | ICD-10-CM

## 2014-04-05 DIAGNOSIS — N400 Enlarged prostate without lower urinary tract symptoms: Secondary | ICD-10-CM | POA: Diagnosis present

## 2014-04-05 DIAGNOSIS — I4821 Permanent atrial fibrillation: Secondary | ICD-10-CM | POA: Diagnosis present

## 2014-04-05 DIAGNOSIS — Z7901 Long term (current) use of anticoagulants: Secondary | ICD-10-CM

## 2014-04-05 DIAGNOSIS — Z87442 Personal history of urinary calculi: Secondary | ICD-10-CM | POA: Diagnosis not present

## 2014-04-05 DIAGNOSIS — I482 Chronic atrial fibrillation: Secondary | ICD-10-CM | POA: Diagnosis present

## 2014-04-05 DIAGNOSIS — Z888 Allergy status to other drugs, medicaments and biological substances status: Secondary | ICD-10-CM

## 2014-04-05 DIAGNOSIS — A419 Sepsis, unspecified organism: Principal | ICD-10-CM

## 2014-04-05 DIAGNOSIS — I5042 Chronic combined systolic (congestive) and diastolic (congestive) heart failure: Secondary | ICD-10-CM | POA: Diagnosis present

## 2014-04-05 DIAGNOSIS — I4892 Unspecified atrial flutter: Secondary | ICD-10-CM | POA: Diagnosis present

## 2014-04-05 DIAGNOSIS — M109 Gout, unspecified: Secondary | ICD-10-CM | POA: Diagnosis present

## 2014-04-05 DIAGNOSIS — M1990 Unspecified osteoarthritis, unspecified site: Secondary | ICD-10-CM | POA: Diagnosis present

## 2014-04-05 DIAGNOSIS — Z87891 Personal history of nicotine dependence: Secondary | ICD-10-CM

## 2014-04-05 DIAGNOSIS — I428 Other cardiomyopathies: Secondary | ICD-10-CM

## 2014-04-05 DIAGNOSIS — I255 Ischemic cardiomyopathy: Secondary | ICD-10-CM | POA: Diagnosis present

## 2014-04-05 LAB — CBC WITH DIFFERENTIAL/PLATELET
BASOS ABS: 0 10*3/uL (ref 0.0–0.1)
Basophils Relative: 0 % (ref 0–1)
EOS ABS: 0 10*3/uL (ref 0.0–0.7)
Eosinophils Relative: 0 % (ref 0–5)
HCT: 44.2 % (ref 39.0–52.0)
Hemoglobin: 15.3 g/dL (ref 13.0–17.0)
LYMPHS ABS: 2.1 10*3/uL (ref 0.7–4.0)
Lymphocytes Relative: 20 % (ref 12–46)
MCH: 34.9 pg — ABNORMAL HIGH (ref 26.0–34.0)
MCHC: 34.6 g/dL (ref 30.0–36.0)
MCV: 100.7 fL — ABNORMAL HIGH (ref 78.0–100.0)
Monocytes Absolute: 0.9 10*3/uL (ref 0.1–1.0)
Monocytes Relative: 9 % (ref 3–12)
Neutro Abs: 7.4 10*3/uL (ref 1.7–7.7)
Neutrophils Relative %: 71 % (ref 43–77)
PLATELETS: 105 10*3/uL — AB (ref 150–400)
RBC: 4.39 MIL/uL (ref 4.22–5.81)
RDW: 13.2 % (ref 11.5–15.5)
WBC: 10.4 10*3/uL (ref 4.0–10.5)

## 2014-04-05 LAB — URINALYSIS, ROUTINE W REFLEX MICROSCOPIC
Bilirubin Urine: NEGATIVE
GLUCOSE, UA: NEGATIVE mg/dL
Ketones, ur: 15 mg/dL — AB
LEUKOCYTES UA: NEGATIVE
NITRITE: NEGATIVE
Specific Gravity, Urine: 1.015 (ref 1.005–1.030)
UROBILINOGEN UA: 0.2 mg/dL (ref 0.0–1.0)
pH: 5.5 (ref 5.0–8.0)

## 2014-04-05 LAB — PROTIME-INR
INR: 1.2 (ref 0.00–1.49)
Prothrombin Time: 15.4 seconds — ABNORMAL HIGH (ref 11.6–15.2)

## 2014-04-05 LAB — COMPREHENSIVE METABOLIC PANEL
ALBUMIN: 3.7 g/dL (ref 3.5–5.2)
ALT: 27 U/L (ref 0–53)
ANION GAP: 11 (ref 5–15)
AST: 41 U/L — ABNORMAL HIGH (ref 0–37)
Alkaline Phosphatase: 39 U/L (ref 39–117)
BILIRUBIN TOTAL: 1.6 mg/dL — AB (ref 0.3–1.2)
BUN: 16 mg/dL (ref 6–23)
CO2: 25 mmol/L (ref 19–32)
CREATININE: 1.08 mg/dL (ref 0.50–1.35)
Calcium: 8.8 mg/dL (ref 8.4–10.5)
Chloride: 97 mEq/L (ref 96–112)
GFR calc Af Amer: 74 mL/min — ABNORMAL LOW (ref >90)
GFR calc non Af Amer: 64 mL/min — ABNORMAL LOW (ref >90)
Glucose, Bld: 219 mg/dL — ABNORMAL HIGH (ref 70–99)
Potassium: 4.1 mmol/L (ref 3.5–5.1)
Sodium: 133 mmol/L — ABNORMAL LOW (ref 135–145)
TOTAL PROTEIN: 6.4 g/dL (ref 6.0–8.3)

## 2014-04-05 LAB — INFLUENZA PANEL BY PCR (TYPE A & B)
H1N1 flu by pcr: NOT DETECTED
INFLAPCR: NEGATIVE
Influenza B By PCR: NEGATIVE

## 2014-04-05 LAB — URINE MICROSCOPIC-ADD ON

## 2014-04-05 LAB — DIGOXIN LEVEL: Digoxin Level: 0.7 ng/mL — ABNORMAL LOW (ref 0.8–2.0)

## 2014-04-05 LAB — LIPASE, BLOOD: Lipase: 15 U/L (ref 11–59)

## 2014-04-05 LAB — GLUCOSE, CAPILLARY
GLUCOSE-CAPILLARY: 183 mg/dL — AB (ref 70–99)
Glucose-Capillary: 179 mg/dL — ABNORMAL HIGH (ref 70–99)

## 2014-04-05 LAB — TROPONIN I
Troponin I: 0.06 ng/mL — ABNORMAL HIGH (ref <0.031)
Troponin I: 0.05 ng/mL — ABNORMAL HIGH (ref <0.031)
Troponin I: 0.04 ng/mL — ABNORMAL HIGH (ref <0.031)
Troponin I: 0.04 ng/mL — ABNORMAL HIGH (ref <0.031)

## 2014-04-05 LAB — BRAIN NATRIURETIC PEPTIDE: B NATRIURETIC PEPTIDE 5: 502 pg/mL — AB (ref 0.0–100.0)

## 2014-04-05 LAB — MRSA PCR SCREENING: MRSA by PCR: NEGATIVE

## 2014-04-05 LAB — RAPID STREP SCREEN (MED CTR MEBANE ONLY): Streptococcus, Group A Screen (Direct): NEGATIVE

## 2014-04-05 LAB — LACTIC ACID, PLASMA: Lactic Acid, Venous: 2.2 mmol/L (ref 0.5–2.2)

## 2014-04-05 LAB — PROCALCITONIN: PROCALCITONIN: 0.1 ng/mL

## 2014-04-05 LAB — TSH: TSH: 1.749 u[IU]/mL (ref 0.350–4.500)

## 2014-04-05 MED ORDER — INSULIN ASPART 100 UNIT/ML ~~LOC~~ SOLN
0.0000 [IU] | Freq: Three times a day (TID) | SUBCUTANEOUS | Status: DC
  Administered 2014-04-05: 2 [IU] via SUBCUTANEOUS
  Administered 2014-04-06: 3 [IU] via SUBCUTANEOUS
  Administered 2014-04-06 (×3): 2 [IU] via SUBCUTANEOUS
  Administered 2014-04-07: 5 [IU] via SUBCUTANEOUS
  Administered 2014-04-07: 2 [IU] via SUBCUTANEOUS

## 2014-04-05 MED ORDER — ASPIRIN 300 MG RE SUPP: 300.0000 mg | RECTAL | Status: AC

## 2014-04-05 MED ORDER — ONDANSETRON HCL 4 MG/2ML IJ SOLN: 4.0000 mg | Freq: Four times a day (QID) | INTRAMUSCULAR | Status: DC | PRN

## 2014-04-05 MED ORDER — PERFLUTREN LIPID MICROSPHERE
1.0000 mL | INTRAVENOUS | Status: AC | PRN
  Administered 2014-04-05 (×2): 1 mL via INTRAVENOUS
  Filled 2014-04-05: qty 10

## 2014-04-05 MED ORDER — HEPARIN SODIUM (PORCINE) 5000 UNIT/ML IJ SOLN
5000.0000 [IU] | Freq: Three times a day (TID) | INTRAMUSCULAR | Status: DC
  Administered 2014-04-05 – 2014-04-07 (×3): 5000 [IU] via SUBCUTANEOUS
  Filled 2014-04-05 (×4): qty 1

## 2014-04-05 MED ORDER — SODIUM CHLORIDE 0.9 % IV SOLN
INTRAVENOUS | Status: DC
  Administered 2014-04-05 – 2014-04-06 (×2): via INTRAVENOUS

## 2014-04-05 MED ORDER — TAMSULOSIN HCL 0.4 MG PO CAPS
0.4000 mg | ORAL_CAPSULE | Freq: Every day | ORAL | Status: DC
  Administered 2014-04-05 – 2014-04-07 (×3): 0.4 mg via ORAL
  Filled 2014-04-05 (×3): qty 1

## 2014-04-05 MED ORDER — ACETAMINOPHEN 500 MG PO TABS
1000.0000 mg | ORAL_TABLET | Freq: Once | ORAL | Status: AC
  Administered 2014-04-05: 1000 mg via ORAL
  Filled 2014-04-05: qty 2

## 2014-04-05 MED ORDER — VANCOMYCIN HCL IN DEXTROSE 1-5 GM/200ML-% IV SOLN
1000.0000 mg | Freq: Once | INTRAVENOUS | Status: AC
  Administered 2014-04-05: 1000 mg via INTRAVENOUS
  Filled 2014-04-05: qty 200

## 2014-04-05 MED ORDER — INSULIN ASPART 100 UNIT/ML ~~LOC~~ SOLN
0.0000 [IU] | SUBCUTANEOUS | Status: DC
  Administered 2014-04-05: 2 [IU] via SUBCUTANEOUS

## 2014-04-05 MED ORDER — DIGOXIN 125 MCG PO TABS
0.2500 mg | ORAL_TABLET | Freq: Every day | ORAL | Status: DC
  Administered 2014-04-06 – 2014-04-07 (×2): 0.25 mg via ORAL
  Filled 2014-04-05 (×2): qty 2

## 2014-04-05 MED ORDER — PIPERACILLIN-TAZOBACTAM 3.375 G IVPB
3.3750 g | Freq: Once | INTRAVENOUS | Status: AC
  Administered 2014-04-05: 3.375 g via INTRAVENOUS
  Filled 2014-04-05: qty 50

## 2014-04-05 MED ORDER — ASPIRIN EC 81 MG PO TBEC
81.0000 mg | DELAYED_RELEASE_TABLET | Freq: Every day | ORAL | Status: DC
  Administered 2014-04-06 – 2014-04-07 (×2): 81 mg via ORAL
  Filled 2014-04-05 (×2): qty 1

## 2014-04-05 MED ORDER — NITROGLYCERIN 0.4 MG SL SUBL: 0.4000 mg | SUBLINGUAL_TABLET | SUBLINGUAL | Status: DC | PRN

## 2014-04-05 MED ORDER — SODIUM CHLORIDE 0.9 % IV BOLUS (SEPSIS)
1000.0000 mL | Freq: Once | INTRAVENOUS | Status: AC
  Administered 2014-04-05: 1000 mL via INTRAVENOUS

## 2014-04-05 MED ORDER — TAMSULOSIN HCL 0.4 MG PO CAPS: 0.4000 mg | ORAL_CAPSULE | Freq: Every day | ORAL | Status: DC

## 2014-04-05 MED ORDER — ACETAMINOPHEN 325 MG PO TABS
650.0000 mg | ORAL_TABLET | ORAL | Status: DC | PRN
Start: 2014-04-05 — End: 2014-04-07
  Administered 2014-04-07: 650 mg via ORAL
  Filled 2014-04-05: qty 2

## 2014-04-05 MED ORDER — SODIUM CHLORIDE 0.9 % IV BOLUS (SEPSIS)
500.0000 mL | Freq: Once | INTRAVENOUS | Status: AC
  Administered 2014-04-05: 500 mL via INTRAVENOUS

## 2014-04-05 MED ORDER — ASPIRIN 81 MG PO CHEW
324.0000 mg | CHEWABLE_TABLET | ORAL | Status: AC
  Administered 2014-04-05: 324 mg via ORAL
  Filled 2014-04-05: qty 4

## 2014-04-05 MED ORDER — SODIUM CHLORIDE 0.9 % IV SOLN: INTRAVENOUS | Status: AC

## 2014-04-05 NOTE — Progress Notes (Signed)
PT WILL NEED TO CALL DR Maisie Fus KELLY TO SET UP OUTPATIENT REHAB AFTER HE IS DISCHARGED.

## 2014-04-05 NOTE — H&P (Addendum)
[facility] History and Physical  [name] ZOX:[id number] DOB: 1934/08/07 DOA: 04/05/2014  Referring physician: ED PCP: [name], MD  Specialists: none yet  Chief Complaint: Multiple  HPI:  79 y/o ? isch CM '98 2/2 LAD MI, s/p PTCA/Stent LAD, diag Afib 02/15/11 CHad2Vasc2 score= 4 , EF ? 20% 07/2012 s/p AICD,  OSA on CPAP, HLd,  CPAP, prior smoker 40-pack yr h/o, admission11/21/13 with fall-Rib #'s and resultant Subarachnoid hemorrhage, prior nephrolithiasis 06/2011, diverticulosis came to [facility] ED with malaise starting 03:00 am.   He gives a h/o of feeling "ill" around 12/25-12/26.  He states that he started having an earache on the right side and then started having some throat pain. He started feeling ill and felt discomfort under his right mastoid process. Around that time as well he started having dysuria without frequency. It is noted that patient is on Flomax and Lasix. He states he's had on and off chills and fevers but did not measure his temperature and took an over-the-counter medication called cortisone cold and flu with a red heart on. His daughter adds to the history as patient is a little bit somnolent. In either event he awoke early this morning around 3 AM felt very weak bilaterally without unilateral symptoms suggestive of stroke/TIA and because he felt so weak his wife called 911 and he was brought over to the emergency room for evaluation. Ex line he is not had issues like this before in the past that has had falls wherein he fractured some ribs and had a subretinal hemorrhage. He is usually pretty functional at baseline.  ED work -up Troponin 0.06 EKG shows ST depressions in Precordial ant lateral leads ~ 1 mm, which is a change from  9/15 ekg.  There are no reciprocal changes in lead III Lactic acid 2.2 Pro calcitonin 0.1 WBC 10.4, hemoglobin 15.3, hematocrit 44.2, platelets 105 Sodium 133, potassium 4.1, GFR 64, AST 41, total bili 1.6   Review of  Systems: The patient denies  Chest pain Hematemesis Hematochezia + Dysuria + Aortic + Neck pain No cough No chills + Fever Denies rash Denies sick contact or ill children in the household Denies hematuria Denies blurred vision or double vision Overall just feels weak and has malaise  Past Medical History  Diagnosis Date  . Arteriosclerotic cardiovascular disease (ASCVD)   . COPD (chronic obstructive pulmonary disease)   . Gout   . DJD (degenerative joint disease)   . Hyperlipidemia   . Chronic anticoagulation 2012    2012  . CHF (congestive heart failure)   . Atrial fibrillation     Onset in 2012  . Atrial flutter   . ICD (implantable cardiac defibrillator) in place   . Myocardial infarction 1998  . Ischemic cardiomyopathy   . Obstructive sleep apnea     "went away when I lost a bunch of weight" (08/17/2012)  . Type II diabetes mellitus   . Kidney stone     "just once" (08/17/2012)  . NICM (nonischemic cardiomyopathy), EF 25-30% 2012-08-24  . At risk for sudden cardiac death Aug 24, 2012  . Permanent atrial fibrillation 02/15/2011    Initial onset in 03/2011 with rapid ventricular response   . S/P ICD (internal cardiac defibrillator) procedure, 08/17/12, [employer] 08-24-2012    [employer]   Past Surgical History  Procedure Laterality Date  . Cholecystectomy  2009  . Knee arthroplasty Left 1978    "tendon & cartilege repair" (08/17/2012)  . Vasectomy  ~ 1964  .  Cystoscopy/retrograde/ureteroscopy  06/28/2011    Procedure: CYSTOSCOPY/RETROGRADE/URETEROSCOPY;  Surgeon: Ky Barban, MD;  Location: [facility];  Service: Urology;  Laterality: Right;  . Stone extraction with basket  06/28/2011    Procedure: STONE EXTRACTION WITH BASKET;  Surgeon: Ky Barban, MD;  Location: [facility];  Service: Urology;  Laterality: Right;  specimen given to family per MD  . Cardiac defibrillator placement  08/17/2012    [employer]  . Coronary angioplasty with stent placement   07/14/1996    "1" (08/17/2012)  . Cataract extraction w/ intraocular lens  implant, bilateral Bilateral ~ 2011  . US echocardiography  07/08/2012    EF <20%,mild MR,TR,LA severely dilated  . Myoview perfusion scan  06/02/2012    low risk, extensive scar entire LAD & RCA territory  . S/p icd  08/2012    [employer]  . Implantable cardioverter defibrillator implant N/A 08/17/2012    Procedure: IMPLANTABLE CARDIOVERTER DEFIBRILLATOR IMPLANT;  Surgeon: Thurmon Fair, MD;  Location: [facility] CATH [facility];  Service: Cardiovascular;  Laterality: N/A;   Social History:  History   Social History Narrative    Allergies  Allergen Reactions  . Lipitor [Atorvastatin] Other (See Comments)    myalgias   . Procaine Hcl Nausea And Vomiting  . Tramadol Nausea And Vomiting    Family History  Problem Relation Age of Onset  . Heart failure Mother   . Heart failure Father     Prior to Admission medications   Medication Sig Start Date End Date Taking? Authorizing Provider  carvedilol (COREG) 12.5 MG tablet Take 12.5 mg by mouth 2 (two) times daily with a meal.   Yes Historical Provider, MD  digoxin (LANOXIN) 0.25 MG tablet Take 1 tablet (0.25 mg total) by mouth daily. 02/28/12 04/05/14 Yes Freeman Caldron, PA-C  furosemide (LASIX) 40 MG tablet Take 1 tablet (40 mg total) by mouth daily. 07/12/13  Yes Lennette Bihari, MD  metFORMIN (GLUCOPHAGE) 500 MG tablet Take 500 mg by mouth 2 (two) times daily with a meal.    Yes Historical Provider, MD  pravastatin (PRAVACHOL) 40 MG tablet Take 40 mg by mouth daily.   Yes Historical Provider, MD  tamsulosin (FLOMAX) 0.4 MG CAPS capsule Take 1 capsule (0.4 mg total) by mouth daily. 12/15/13  Yes Dione Booze, MD  warfarin (COUMADIN) 4 MG tablet Take 2-4 mg by mouth See admin instructions. Takes 2mg  for two days, then 4mg  (1 tablet) one day.   Yes Historical Provider, MD  magnesium oxide (MAG-OX) 400 MG tablet Take 400 mg by mouth 2 (two) times daily.      Historical  Provider, MD  Omega-3 Fatty Acids (FISH OIL BURP-LESS) 1000 MG CAPS Take 1,200 mg by mouth 2 (two) times daily.     Historical Provider, MD  ondansetron (ZOFRAN) 4 MG tablet Take 1 tablet (4 mg total) by mouth every 6 (six) hours as needed for nausea. 12/15/13   Dione Booze, MD  ondansetron (ZOFRAN) 4 MG tablet Take 1 tablet (4 mg total) by mouth every 6 (six) hours as needed for nausea. 12/15/13   Dione Booze, MD  ONE TOUCH ULTRA TEST test strip  11/29/13   Historical Provider, MD  oxyCODONE-acetaminophen (PERCOCET/ROXICET) 5-325 MG per tablet Take 1 tablet by mouth every 4 (four) hours as needed for severe pain. 08/02/13   Burgess Amor, PA-C  oxyCODONE-acetaminophen (PERCOCET/ROXICET) 5-325 MG per tablet Take 1 tablet by mouth every 4 (four) hours as needed. 12/15/13   Dione Booze, MD  oxyCODONE-acetaminophen (  PERCOCET/ROXICET) 5-325 MG per tablet Take 1 tablet by mouth every 4 (four) hours as needed. 12/15/13   Dione Boozeavid Glick, MD  Tamsulosin HCl (FLOMAX) 0.4 MG CAPS Take 0.4 mg by mouth daily after breakfast.    Historical Provider, MD   Physical Exam: Filed Vitals:   04/05/14 0630 04/05/14 0700 04/05/14 0710 04/05/14 0716  BP: 106/75 95/59 104/48 104/48  Pulse: 82 50  86  Temp:    99.5 F (37.5 C)  TempSrc:    Rectal  Resp:    22  Height:      Weight:      SpO2: 95% 98%  99%     General:  EOMI, NCAT  Eyes: no pallor no ict  ENT: clear, Both TM's appear red, throat seems a little red withou any swelling of tonsils, no cervical lymphadenopathy  Neck: soft, supple, cannot discern JVD, no bruit  Cardiovascular: s1 s2 no m/r/g  Respiratory: clear, no added sound, no tvr/tvf  Abdomen: soft, NT, ND, obese  Skin: LE's not swollen.  Musculoskeletal: ROM intact   Psychiatric: euthymic and pleasant but somewhat sleepy  Neurologic: weak overall.  No deficit to power however.  Reflexes intact  Labs on Admission:  Basic Metabolic Panel:  Recent Labs Lab 04/05/14 0507  NA 133*  K 4.1  CL  97  CO2 25  GLUCOSE 219*  BUN 16  CREATININE 1.08  CALCIUM 8.8   Liver Function Tests:  Recent Labs Lab 04/05/14 0507  AST 41*  ALT 27  ALKPHOS 39  BILITOT 1.6*  PROT 6.4  ALBUMIN 3.7    Recent Labs Lab 04/05/14 0507  LIPASE 15   No results for input(s): AMMONIA in the last 168 hours. CBC:  Recent Labs Lab 04/05/14 0507  WBC 10.4  NEUTROABS 7.4  HGB 15.3  HCT 44.2  MCV 100.7*  PLT 105*   Cardiac Enzymes:  Recent Labs Lab 04/05/14 0507  TROPONINI 0.06*    BNP (last 3 results) No results for input(s): PROBNP in the last 8760 hours. CBG: No results for input(s): GLUCAP in the last 168 hours.  Radiological Exams on Admission: Dg Chest 2 View  04/05/2014   CLINICAL DATA:  Three-day history of cough  EXAM: CHEST  2 VIEW  COMPARISON:  Aug 18, 2012  FINDINGS: There is no edema or consolidation. Heart is upper normal in size with pulmonary vascularity within normal limits. Pacemaker lead tip is attached to the right ventricle. There is an apparent stent in the left anterior descending coronary artery. There is no appreciable adenopathy. There is atherosclerotic change in the aorta. There is degenerative change in the thoracic spine.  IMPRESSION: No edema or consolidation.   Electronically Signed   By: Bretta BangWilliam  Woodruff M.D.   On: 04/05/2014 07:09    EKG: Independently reviewed. See above  Assessment/Plan Principal Problem:   ACS (acute coronary syndrome) I'm concerned that this patient is having a Q wave infarct and possible type II MI secondary to septic syndrome. He rapidly had a viral illness that has already decompensated his poor reserve. I have asked cardiology to see him. ED nurse is aware to cycle troponins stat with another EKG and I will order a stat echocardiogram. Appreciate greatly cardiology assistance. He may need to be managed at a larger center if this turns out to be an MI. We are limited his hypotension in terms of administering beta blocker and  I am holding off on heparin as well as nitroglycerin at this time  as patient does not complain overtly of chest pain    Sepsis-lactic acidosis type II probably secondary to use of metformin. I'm not completely convinced that he has sepsis however he has been complaining of subjective fever and chills. In addition when he came to the emergency room he had a temperature of 102.6. We will continue vancomycin as well as Zosyn empirically for bacterial infection, in addition I will get a rapid flu test, rapid strep and we will order a viral panel to rule out any other etiology of illness. If nothing turns out from this, we may have to pan scan him to rule out any other possible etiology for fever which would include occult cancer or malignancy. At this stage I do not think we need to worry about autoimmune causes given his age.  If we are limited by use of IV saline to support his blood pressure or if he gets 3 L of IV fluids, we will have to then support his blood pressure with IV further dopamine or levophed.  He will be admitted to step down unit for now and he may need to be upgraded to ICU. I await cardiology seeing the patient for a make a decision as to where to admit him as he may require cardiac cath as well and this cannot occur at [facility] today   Permanent atrial fibrillation, CHad2Vasc2 score= 4, patient at this stage cannot realistically be offered beta blockade or other rate controlling strategies and I'll defer to the expert decision making of cardiology to make that call.  Of note his digitoxin level was subtherapeutic at 0.7 and he will therefore need a load of digoxin if he flips into RVR   NICM (nonischemic cardiomyopathy), EF 25-30%  S/P ICD (internal cardiac defibrillator) procedure, 08/17/12, [employer] implanted--patient has recently had an interrogation in August 2015   Chronic combined systolic and diastolic CHF, NYHA class 2-euvolemic at present.  We will need to carefully  give him IV fluids to support his blood pressure. We will hold off on his Lasix for now   Benign prostatic hyperplasia-continue Flomax   diabetes mellitus type 2-his last A1c was 8.1 which corresponds to a need for insulin.  Please note that his lactic acidosis could be type II secondary to use of metformin and I do not think that patient is septic overtly at this time although he does have fever and other elements of SIRS   CRITICAL CARE Performed by: Rhetta Mura   Total critical care time: 90 minutes  Critical care time was exclusive of separately billable procedures and treating other patients.  Critical care was necessary to treat or prevent imminent or life-threatening deterioration.  Critical care was time spent personally by me on the following activities: development of treatment plan with patient and/or surrogate as well as nursing, discussions with consultants, evaluation of patient's response to treatment, examination of patient, obtaining history from patient or surrogate, ordering and performing treatments and interventions, ordering and review of laboratory studies, ordering and review of radiographic studies, pulse oximetry and re-evaluation of patient's condition.   Discussed with daughter and patient at bedside. He wishes to be full code Disposition pending cardiology input Dr. Tenny Craw will have her slowly spoken with. Patient may need to be transferred to higher level of care with full cardiology support otherwise if she feels he is appropriate for admission to [facility], we will admit him  Rhetta Mura [facility] Pager [phone]  If 7PM-7AM, please  contact night-coverage [email or web address] Password [facility] 04/05/2014, 7:39 AM

## 2014-04-05 NOTE — Progress Notes (Signed)
  Echocardiogram 2D Echocardiogram with Definity has been performed.  Firas Guardado 04/05/2014, 11:06 AM

## 2014-04-05 NOTE — Care Management Utilization Note (Signed)
UR complete 

## 2014-04-05 NOTE — Consult Note (Addendum)
Primary Physician: Primary Cardiologist: Bishop Limbo     HPI: Asked to see about elevation troponin, abnormal EKG Patinet is followed by Bishop Limbo in clinic  Last seen in September 2015 The patinet has a history of  CAD s/p Anterior MI in PCI to LAD in 98.  In 1999 he had stent to LAD and PTCA to Lx   NOvember 2004 PTCA/stent to RCA.  In 2014 (after period of no f/u) the patinet had neclear scan that showed extensive scar in LAD, RCA  LVE was 20% on echo.  Patient underwent ICD placement. The patinet also has a history of HTn, HL , afib (on coumadin), HL, sleep apnea.   The patinet presents today with complaints of feeling ill (earach, throat pain, R sided iscomfort, dysuria, f/c.  He did not eat since yesterday   He denies CP  Breathing is OK          Past Medical History  Diagnosis Date  . Arteriosclerotic cardiovascular disease (ASCVD)   . COPD (chronic obstructive pulmonary disease)   . Gout   . DJD (degenerative joint disease)   . Hyperlipidemia   . Chronic anticoagulation 2012    2012  . CHF (congestive heart failure)   . Atrial fibrillation     Onset in 2012  . Atrial flutter   . ICD (implantable cardiac defibrillator) in place   . Myocardial infarction 1998  . Ischemic cardiomyopathy   . Obstructive sleep apnea     "went away when I lost a bunch of weight" (08/17/2012)  . Type II diabetes mellitus   . Kidney stone     "just once" (08/17/2012)  . NICM (nonischemic cardiomyopathy), EF 25-30% Sep 16, 2012  . At risk for sudden cardiac death 2012-09-16  . Permanent atrial fibrillation 02/15/2011    Initial onset in 03/2011 with rapid ventricular response   . S/P ICD (internal cardiac defibrillator) procedure, 08/17/12, [employer] Sep 16, 2012    [facility]    Medications Prior to Admission  Medication Sig Dispense Refill  . carvedilol (COREG) 12.5 MG tablet Take 12.5 mg by mouth 2 (two) times daily with a meal.    . digoxin (LANOXIN) 0.25 MG tablet Take 1  tablet (0.25 mg total) by mouth daily. 30 tablet 3  . furosemide (LASIX) 40 MG tablet Take 1 tablet (40 mg total) by mouth daily. 30 tablet 10  . magnesium oxide (MAG-OX) 400 MG tablet Take 400 mg by mouth 2 (two) times daily.      . metFORMIN (GLUCOPHAGE) 500 MG tablet Take 500 mg by mouth 2 (two) times daily with a meal.     . Omega-3 Fatty Acids (FISH OIL BURP-LESS) 1000 MG CAPS Take 1,200 mg by mouth 2 (two) times daily.     . pravastatin (PRAVACHOL) 40 MG tablet Take 40 mg by mouth daily.    . Tamsulosin HCl (FLOMAX) 0.4 MG CAPS Take 0.4 mg by mouth daily after breakfast.    . warfarin (COUMADIN) 4 MG tablet Take 2-4 mg by mouth See admin instructions. Takes 2mg  for two days, then 4mg  (1 tablet) one day.    . ondansetron (ZOFRAN) 4 MG tablet Take 1 tablet (4 mg total) by mouth every 6 (six) hours as needed for nausea. (Patient not taking: Reported on 04/05/2014) 4 tablet 0  . ondansetron (ZOFRAN) 4 MG tablet Take 1 tablet (4 mg total) by mouth every 6 (six) hours as needed for nausea. (Patient not taking: Reported on 04/05/2014) 12 tablet  0  . ONE TOUCH ULTRA TEST test strip     . oxyCODONE-acetaminophen (PERCOCET/ROXICET) 5-325 MG per tablet Take 1 tablet by mouth every 4 (four) hours as needed for severe pain. (Patient not taking: Reported on 04/05/2014) 20 tablet 0  . oxyCODONE-acetaminophen (PERCOCET/ROXICET) 5-325 MG per tablet Take 1 tablet by mouth every 4 (four) hours as needed. (Patient not taking: Reported on 04/05/2014) 6 tablet 0  . oxyCODONE-acetaminophen (PERCOCET/ROXICET) 5-325 MG per tablet Take 1 tablet by mouth every 4 (four) hours as needed. (Patient not taking: Reported on 04/05/2014) 20 tablet 0  . tamsulosin (FLOMAX) 0.4 MG CAPS capsule Take 1 capsule (0.4 mg total) by mouth daily. (Patient not taking: Reported on 04/05/2014) 10 capsule 0     . vancomycin  1,000 mg Intravenous Once    Infusions: . sodium chloride      Allergies  Allergen Reactions  . Lipitor  [Atorvastatin] Other (See Comments)    myalgias   . Procaine Hcl Nausea And Vomiting  . Tramadol Nausea And Vomiting    History   Social History  . Marital Status: Married    Spouse Name: N/A    Number of Children: N/A  . Years of Education: N/A   Occupational History  . Not on file.   Social History Main Topics  . Smoking status: Former Smoker -- 2.00 packs/day for 40 years    Types: Cigarettes    Quit date: 04/08/1992  . Smokeless tobacco: Former Neurosurgeon  . Alcohol Use: No     Comment: 08/17/2012 "quit drinking in 1983"  . Drug Use: No  . Sexual Activity: No   Other Topics Concern  . Not on file   Social History Narrative    Family History  Problem Relation Age of Onset  . Heart failure Mother   . Heart failure Father     REVIEW OF SYSTEMS:  All systems reviewed  Negative to the above problem except as noted above.    PHYSICAL EXAM: Filed Vitals:   04/05/14 1230  BP: 104/69  Pulse: 58  Temp:   Resp: 22     Intake/Output Summary (Last 24 hours) at 04/05/14 1306 Last data filed at 04/05/14 0745  Gross per 24 hour  Intake      0 ml  Output    100 ml  Net   -100 ml    General:  Pateint is in NAD  . No respiratory difficulty HEENT: normal Neck: supple. no JVD. Carotids 2+ bilat; no bruits. No lymphadenopathy or thryomegaly appreciated. Cor: PMI nondisplaced. Irregular rate & rhythm. No rubs, gallops or murmurs. Lungs: Rel clear to auscultation.   Abdomen: soft, nontender, nondistended. No hepatosplenomegaly. No bruits or masses. Good bowel sounds. Extremities: no cyanosis, clubbing, rash, edema Neuro: alert & oriented x 3, cranial nerves grossly intact. moves all 4 extremities w/o difficulty. Affect pleasant.  ECG:  Atrial fib 73 bpm  LBBB  Nonspecific ST changes    Results for orders placed or performed during the hospital encounter of 04/05/14 (from the past 24 hour(s))  Comprehensive metabolic panel     Status: Abnormal   Collection Time: 04/05/14   5:07 AM  Result Value Ref Range   Sodium 133 (L) 135 - 145 mmol/L   Potassium 4.1 3.5 - 5.1 mmol/L   Chloride 97 96 - 112 mEq/L   CO2 25 19 - 32 mmol/L   Glucose, Bld 219 (H) 70 - 99 mg/dL   BUN 16 6 - 23 mg/dL  Creatinine, Ser 1.08 0.50 - 1.35 mg/dL   Calcium 8.8 8.4 - 92.1 mg/dL   Total Protein 6.4 6.0 - 8.3 g/dL   Albumin 3.7 3.5 - 5.2 g/dL   AST 41 (H) 0 - 37 U/L   ALT 27 0 - 53 U/L   Alkaline Phosphatase 39 39 - 117 U/L   Total Bilirubin 1.6 (H) 0.3 - 1.2 mg/dL   GFR calc non Af Amer 64 (L) >90 mL/min   GFR calc Af Amer 74 (L) >90 mL/min   Anion gap 11 5 - 15  Lipase, blood     Status: None   Collection Time: 04/05/14  5:07 AM  Result Value Ref Range   Lipase 15 11 - 59 U/L  Troponin I     Status: Abnormal   Collection Time: 04/05/14  5:07 AM  Result Value Ref Range   Troponin I 0.06 (H) <0.031 ng/mL  CBC with Differential     Status: Abnormal   Collection Time: 04/05/14  5:07 AM  Result Value Ref Range   WBC 10.4 4.0 - 10.5 K/uL   RBC 4.39 4.22 - 5.81 MIL/uL   Hemoglobin 15.3 13.0 - 17.0 g/dL   HCT 19.4 17.4 - 08.1 %   MCV 100.7 (H) 78.0 - 100.0 fL   MCH 34.9 (H) 26.0 - 34.0 pg   MCHC 34.6 30.0 - 36.0 g/dL   RDW 44.8 18.5 - 63.1 %   Platelets 105 (L) 150 - 400 K/uL   Neutrophils Relative % 71 43 - 77 %   Neutro Abs 7.4 1.7 - 7.7 K/uL   Lymphocytes Relative 20 12 - 46 %   Lymphs Abs 2.1 0.7 - 4.0 K/uL   Monocytes Relative 9 3 - 12 %   Monocytes Absolute 0.9 0.1 - 1.0 K/uL   Eosinophils Relative 0 0 - 5 %   Eosinophils Absolute 0.0 0.0 - 0.7 K/uL   Basophils Relative 0 0 - 1 %   Basophils Absolute 0.0 0.0 - 0.1 K/uL  Protime-INR     Status: Abnormal   Collection Time: 04/05/14  5:07 AM  Result Value Ref Range   Prothrombin Time 15.4 (H) 11.6 - 15.2 seconds   INR 1.20 0.00 - 1.49  Digoxin level     Status: Abnormal   Collection Time: 04/05/14  5:07 AM  Result Value Ref Range   Digoxin Level 0.7 (L) 0.8 - 2.0 ng/mL  Lactic acid, plasma     Status: None    Collection Time: 04/05/14  5:08 AM  Result Value Ref Range   Lactic Acid, Venous 2.2 0.5 - 2.2 mmol/L  Blood culture (routine x 2)     Status: None (Preliminary result)   Collection Time: 04/05/14  6:11 AM  Result Value Ref Range   Specimen Description Blood    Special Requests Normal    Culture NO GROWTH <24 HRS    Report Status PENDING   Blood culture (routine x 2)     Status: None (Preliminary result)   Collection Time: 04/05/14  6:11 AM  Result Value Ref Range   Specimen Description Blood    Special Requests Normal    Culture NO GROWTH <24 HRS    Report Status PENDING   Procalcitonin - Baseline     Status: None   Collection Time: 04/05/14  7:28 AM  Result Value Ref Range   Procalcitonin 0.10 ng/mL  Urinalysis, Routine w reflex microscopic     Status: Abnormal   Collection Time:  04/05/14  7:43 AM  Result Value Ref Range   Color, Urine YELLOW YELLOW   APPearance CLEAR CLEAR   Specific Gravity, Urine 1.015 1.005 - 1.030   pH 5.5 5.0 - 8.0   Glucose, UA NEGATIVE NEGATIVE mg/dL   Hgb urine dipstick MODERATE (A) NEGATIVE   Bilirubin Urine NEGATIVE NEGATIVE   Ketones, ur 15 (A) NEGATIVE mg/dL   Protein, ur TRACE (A) NEGATIVE mg/dL   Urobilinogen, UA 0.2 0.0 - 1.0 mg/dL   Nitrite NEGATIVE NEGATIVE   Leukocytes, UA NEGATIVE NEGATIVE  Urine microscopic-add on     Status: Abnormal   Collection Time: 04/05/14  7:43 AM  Result Value Ref Range   RBC / HPF 7-10 <3 RBC/hpf   Bacteria, UA FEW (A) RARE  Troponin I     Status: Abnormal   Collection Time: 04/05/14  8:49 AM  Result Value Ref Range   Troponin I 0.05 (H) <0.031 ng/mL  Rapid strep screen     Status: None   Collection Time: 04/05/14  9:01 AM  Result Value Ref Range   Streptococcus, Group A Screen (Direct) NEGATIVE NEGATIVE  Influenza panel by PCR (type A & B, H1N1)     Status: None   Collection Time: 04/05/14  9:01 AM  Result Value Ref Range   Influenza A By PCR NEGATIVE NEGATIVE   Influenza B By PCR NEGATIVE NEGATIVE    H1N1 flu by pcr NOT DETECTED NOT DETECTED   Dg Chest 2 View  04/05/2014   CLINICAL DATA:  Three-day history of cough  EXAM: CHEST  2 VIEW  COMPARISON:  Aug 18, 2012  FINDINGS: There is no edema or consolidation. Heart is upper normal in size with pulmonary vascularity within normal limits. Pacemaker lead tip is attached to the right ventricle. There is an apparent stent in the left anterior descending coronary artery. There is no appreciable adenopathy. There is atherosclerotic change in the aorta. There is degenerative change in the thoracic spine.  IMPRESSION: No edema or consolidation.   Electronically Signed   By: Bretta BangWilliam  Woodruff M.D.   On: 04/05/2014 07:09     ASSESSMENT:  Marcelo Baldyatinet is a 79 yo with known CAD, CHF  Presents with complaints of feeling weak.   On exam  BP 90s to 110s.  Volume appears OK   EKG is non diagnostic.   Echo done  Shows severe LV dysfunction  Ths appears unchanged from previous.  (prelim, full report pending) Troponin minimally elevated  Lactic acid elevated   Overall, it appears trop elevation reflects demand ischemi in setting of possible sepsis.  Would provide supportive Rx (fluids, ABX)  Hydrate.  Follow BP.    Strict I/Os.    2.  CAD  As above  3.  Atrial fib  On anticoagulation at home  Follow  Would prob hold coumadin for right now until cliinical course is clarified.    Dietrich PatesPaula Maryna Yeagle

## 2014-04-05 NOTE — ED Notes (Signed)
MD at bedside. 

## 2014-04-05 NOTE — Progress Notes (Addendum)
PT HAVING 1.25 SEC PAUSES IN HR RHYTHM. [employer] CALLED AND REPRESENTATIVE STATED THAT THIS MEANS VENT RATE SET A 50. IF RATE INCREASED TO 60 IT WOULD CX VENTICULAR RATE TO INCREASE ONLY. PT IS NOT SYMPTOMATIC AT THIS TIME. THIS IS ALSO EFFECTED BY PT HAVING ATRIAL FIB/FLUTTER.

## 2014-04-05 NOTE — ED Provider Notes (Signed)
CSN: [id number]     Arrival date & time 04/05/14  7829 History   First MD Initiated Contact with Patient 04/05/14 0457     Chief Complaint  Patient presents with  . Fall     (Consider location/radiation/quality/duration/timing/severity/associated sxs/prior Treatment) HPI Comments: Patient is a 79 year old male with extensive past medical history including coronary artery disease, COPD, CHF, atrial fibrillation, cardiomyopathy, AICD, diabetes. He presents today with complaints of weakness. He states he started feeling badly several days ago while at a Christmas party. He has felt sick and very weak since that time. He states that his mouth feels very dry and he is urinating frequently. This evening, he attempted to get up to go to the bathroom when his legs would not hold him and he was forced to lower himself to the ground. EMS was called and he was brought for evaluation of this. He denies to me that he actually fell and denies any injury or sustaining any trauma. He states he feels chilled but has denied having a fever.  The history is provided by the patient.    Past Medical History  Diagnosis Date  . Arteriosclerotic cardiovascular disease (ASCVD)   . COPD (chronic obstructive pulmonary disease)   . Gout   . DJD (degenerative joint disease)   . Hyperlipidemia   . Chronic anticoagulation 2012    2012  . CHF (congestive heart failure)   . Atrial fibrillation     Onset in 2012  . Atrial flutter   . ICD (implantable cardiac defibrillator) in place   . Myocardial infarction 1998  . Ischemic cardiomyopathy   . Obstructive sleep apnea     "went away when I lost a bunch of weight" (08/17/2012)  . Type II diabetes mellitus   . Kidney stone     "just once" (08/17/2012)  . NICM (nonischemic cardiomyopathy), EF 25-30% 27-Aug-2012  . At risk for sudden cardiac death Aug 27, 2012  . Permanent atrial fibrillation 02/15/2011    Initial onset in 03/2011 with rapid ventricular response   . S/P ICD  (internal cardiac defibrillator) procedure, 08/17/12, [employer] 08-27-12    [facility]   Past Surgical History  Procedure Laterality Date  . Cholecystectomy  2009  . Knee arthroplasty Left 1978    "tendon & cartilege repair" (08/17/2012)  . Vasectomy  ~ 1964  . Cystoscopy/retrograde/ureteroscopy  06/28/2011    Procedure: CYSTOSCOPY/RETROGRADE/URETEROSCOPY;  Surgeon: Ky Barban, MD;  Location: [facility];  Service: Urology;  Laterality: Right;  . Stone extraction with basket  06/28/2011    Procedure: STONE EXTRACTION WITH BASKET;  Surgeon: Ky Barban, MD;  Location: [facility];  Service: Urology;  Laterality: Right;  specimen given to family per MD  . Cardiac defibrillator placement  08/17/2012    [employer]  . Coronary angioplasty with stent placement  07/14/1996    "1" (08/17/2012)  . Cataract extraction w/ intraocular lens  implant, bilateral Bilateral ~ 2011  . US echocardiography  07/08/2012    EF <20%,mild MR,TR,LA severely dilated  . Myoview perfusion scan  06/02/2012    low risk, extensive scar entire LAD & RCA territory  . S/p icd  08/2012    [employer]  . Implantable cardioverter defibrillator implant N/A 08/17/2012    Procedure: IMPLANTABLE CARDIOVERTER DEFIBRILLATOR IMPLANT;  Surgeon: Thurmon Fair, MD;  Location: [facility];  Service: Cardiovascular;  Laterality: N/A;   Family History  Problem Relation Age of Onset  . Heart failure Mother   . Heart failure  Father    History  Substance Use Topics  . Smoking status: Former Smoker -- 2.00 packs/day for 40 years    Types: Cigarettes    Quit date: 04/08/1992  . Smokeless tobacco: Former Neurosurgeon  . Alcohol Use: No     Comment: 08/17/2012 "quit drinking in 1983"    Review of Systems  Constitutional: Positive for chills and fatigue.  All other systems reviewed and are negative.     Allergies  Lipitor; Procaine hcl; and Tramadol  Home Medications   Prior to Admission medications   Medication Sig  Start Date End Date Taking? Authorizing Provider  carvedilol (COREG) 12.5 MG tablet Take 12.5 mg by mouth 2 (two) times daily with a meal.   Yes Historical Provider, MD  digoxin (LANOXIN) 0.25 MG tablet Take 1 tablet (0.25 mg total) by mouth daily. 02/28/12 04/05/14 Yes Freeman Caldron, PA-C  furosemide (LASIX) 40 MG tablet Take 1 tablet (40 mg total) by mouth daily. 07/12/13  Yes Lennette Bihari, MD  metFORMIN (GLUCOPHAGE) 500 MG tablet Take 500 mg by mouth 2 (two) times daily with a meal.    Yes Historical Provider, MD  pravastatin (PRAVACHOL) 40 MG tablet Take 40 mg by mouth daily.   Yes Historical Provider, MD  tamsulosin (FLOMAX) 0.4 MG CAPS capsule Take 1 capsule (0.4 mg total) by mouth daily. 12/15/13  Yes Dione Booze, MD  warfarin (COUMADIN) 4 MG tablet Take 2-4 mg by mouth See admin instructions. Takes 2mg  for two days, then 4mg  (1 tablet) one day.   Yes Historical Provider, MD  magnesium oxide (MAG-OX) 400 MG tablet Take 400 mg by mouth 2 (two) times daily.      Historical Provider, MD  Omega-3 Fatty Acids (FISH OIL BURP-LESS) 1000 MG CAPS Take 1,200 mg by mouth 2 (two) times daily.     Historical Provider, MD  ondansetron (ZOFRAN) 4 MG tablet Take 1 tablet (4 mg total) by mouth every 6 (six) hours as needed for nausea. 12/15/13   Dione Booze, MD  ondansetron (ZOFRAN) 4 MG tablet Take 1 tablet (4 mg total) by mouth every 6 (six) hours as needed for nausea. 12/15/13   Dione Booze, MD  ONE TOUCH ULTRA TEST test strip  11/29/13   Historical Provider, MD  oxyCODONE-acetaminophen (PERCOCET/ROXICET) 5-325 MG per tablet Take 1 tablet by mouth every 4 (four) hours as needed for severe pain. 08/02/13   Burgess Amor, PA-C  oxyCODONE-acetaminophen (PERCOCET/ROXICET) 5-325 MG per tablet Take 1 tablet by mouth every 4 (four) hours as needed. 12/15/13   Dione Booze, MD  oxyCODONE-acetaminophen (PERCOCET/ROXICET) 5-325 MG per tablet Take 1 tablet by mouth every 4 (four) hours as needed. 12/15/13   Dione Booze, MD   Tamsulosin HCl (FLOMAX) 0.4 MG CAPS Take 0.4 mg by mouth daily after breakfast.    Historical Provider, MD   BP 108/66 mmHg  Pulse 82  Temp(Src) 99 F (37.2 C)  Resp 22  Ht 5' 4.5" (1.638 m)  Wt 171 lb (77.565 kg)  BMI 28.91 kg/m2  SpO2 92% Physical Exam  Constitutional: He is oriented to person, place, and time. He appears well-developed and well-nourished. No distress.  HENT:  Head: Normocephalic and atraumatic.  Mucous membranes appear dry.  Eyes: EOM are normal. Pupils are equal, round, and reactive to light.  Neck: Normal range of motion. Neck supple.  Cardiovascular:  No murmur heard. Heart is irregularly irregular.  Pulmonary/Chest: Effort normal and breath sounds normal. No respiratory distress. He has no wheezes.  Abdominal: Soft. Bowel sounds are normal. He exhibits no distension. There is no tenderness.  Musculoskeletal: Normal range of motion. He exhibits no edema.  Lymphadenopathy:    He has no cervical adenopathy.  Neurological: He is alert and oriented to person, place, and time. No cranial nerve deficit. Coordination normal.  Skin: Skin is warm and dry. He is not diaphoretic.  Nursing note and vitals reviewed.   ED Course  Procedures (including critical care time) Labs Review Labs Reviewed  COMPREHENSIVE METABOLIC PANEL  LIPASE, BLOOD  TROPONIN I  CBC WITH DIFFERENTIAL  URINALYSIS, ROUTINE W REFLEX MICROSCOPIC  LACTIC ACID, PLASMA    Imaging Review No results found.   EKG Interpretation   Date/Time:  Tuesday April 05 2014 08:56:48 EST Ventricular Rate:  73 PR Interval:    QRS Duration: 147 QT Interval:  399 QTC Calculation: 440 R Axis:   30 Text Interpretation:  Atrial fibrillation Ventricular premature complex  IVCD, consider atypical LBBB No significant change since last tracing  Confirmed by WARD,  DO, KRISTEN ([id number]) on 04/05/2014 9:01:20 AM      MDM   Final diagnoses:  None    Patient with extensive past medical history. He  presents today after becoming chilled and weak at home. He attempted to walk to the bathroom, however his legs would not hold him. He arrived here with a fever of 102.6, however remainder of the workup is essentially unremarkable. Urinalysis and chest x-ray do not reveal an obvious source for the fever and blood cultures are pending. Due to his multiple comorbidities, I feel as though admission for observation is indicated. He will be treated with broad-spectrum antibiotics presumptively. I've spoken with Dr. Mahala MenghiniSamtani who agrees to admit.    Geoffery Lyonsouglas Leahanna Buser, MD 04/06/14 [phone]

## 2014-04-05 NOTE — ED Notes (Signed)
Pt brought in by [facility] for c/o fall; pt was found by [facility] at kitchen table, pt c/o sinus pain and sinus pressure; CBG 223

## 2014-04-06 LAB — GLUCOSE, CAPILLARY
GLUCOSE-CAPILLARY: 183 mg/dL — AB (ref 70–99)
Glucose-Capillary: 162 mg/dL — ABNORMAL HIGH (ref 70–99)
Glucose-Capillary: 189 mg/dL — ABNORMAL HIGH (ref 70–99)
Glucose-Capillary: 202 mg/dL — ABNORMAL HIGH (ref 70–99)

## 2014-04-06 LAB — CBC WITH DIFFERENTIAL/PLATELET
BASOS ABS: 0 10*3/uL (ref 0.0–0.1)
Basophils Relative: 0 % (ref 0–1)
EOS PCT: 1 % (ref 0–5)
Eosinophils Absolute: 0.1 10*3/uL (ref 0.0–0.7)
HCT: 40.1 % (ref 39.0–52.0)
Hemoglobin: 13.7 g/dL (ref 13.0–17.0)
LYMPHS ABS: 2.1 10*3/uL (ref 0.7–4.0)
Lymphocytes Relative: 24 % (ref 12–46)
MCH: 34.6 pg — ABNORMAL HIGH (ref 26.0–34.0)
MCHC: 34.2 g/dL (ref 30.0–36.0)
MCV: 101.3 fL — ABNORMAL HIGH (ref 78.0–100.0)
Monocytes Absolute: 0.8 10*3/uL (ref 0.1–1.0)
Monocytes Relative: 9 % (ref 3–12)
NEUTROS PCT: 66 % (ref 43–77)
Neutro Abs: 5.8 10*3/uL (ref 1.7–7.7)
PLATELETS: 95 10*3/uL — AB (ref 150–400)
RBC: 3.96 MIL/uL — AB (ref 4.22–5.81)
RDW: 13.4 % (ref 11.5–15.5)
WBC: 8.7 10*3/uL (ref 4.0–10.5)

## 2014-04-06 LAB — TROPONIN I: Troponin I: 0.03 ng/mL (ref <0.031)

## 2014-04-06 LAB — URINE CULTURE
COLONY COUNT: NO GROWTH
CULTURE: NO GROWTH

## 2014-04-06 LAB — PROCALCITONIN: PROCALCITONIN: 0.18 ng/mL

## 2014-04-06 MED ORDER — SODIUM CHLORIDE 0.9 % IV SOLN: INTRAVENOUS | Status: DC

## 2014-04-06 MED ORDER — WARFARIN - PHARMACIST DOSING INPATIENT: Status: DC

## 2014-04-06 MED ORDER — WARFARIN SODIUM 5 MG PO TABS
5.0000 mg | ORAL_TABLET | Freq: Once | ORAL | Status: AC
  Administered 2014-04-06: 5 mg via ORAL
  Filled 2014-04-06: qty 1

## 2014-04-06 MED ORDER — GUAIFENESIN-DM 100-10 MG/5ML PO SYRP
5.0000 mL | ORAL_SOLUTION | ORAL | Status: DC | PRN
  Administered 2014-04-06 (×3): 5 mL via ORAL
  Filled 2014-04-06 (×3): qty 5

## 2014-04-06 MED ORDER — PIPERACILLIN-TAZOBACTAM 3.375 G IVPB
3.3750 g | Freq: Three times a day (TID) | INTRAVENOUS | Status: DC
  Administered 2014-04-06 – 2014-04-07 (×3): 3.375 g via INTRAVENOUS
  Filled 2014-04-06 (×16): qty 50

## 2014-04-06 MED ORDER — VANCOMYCIN HCL IN DEXTROSE 750-5 MG/150ML-% IV SOLN
750.0000 mg | Freq: Two times a day (BID) | INTRAVENOUS | Status: DC
  Administered 2014-04-06 – 2014-04-07 (×2): 750 mg via INTRAVENOUS
  Filled 2014-04-06 (×10): qty 150

## 2014-04-06 MED ORDER — LORAZEPAM 0.5 MG PO TABS: 0.5000 mg | ORAL_TABLET | Freq: Four times a day (QID) | ORAL | Status: DC | PRN

## 2014-04-06 NOTE — Progress Notes (Signed)
Subjective: Denies CP  Breathing is OK  Wants to go home   Objective: Filed Vitals:   04/06/14 0700 04/06/14 0800 04/06/14 0900 04/06/14 1141  BP: 98/57  94/70   Pulse: 86 89 109   Temp:    98.8 F (37.1 C)  TempSrc:    Oral  Resp: 29 27 25    Height:      Weight:      SpO2: 96% 96% 98%    Weight change: 7 lb 9.2 oz (3.435 kg)  Intake/Output Summary (Last 24 hours) at 04/06/14 1426 Last data filed at 04/06/14 1347  Gross per 24 hour  Intake   2580 ml  Output   1100 ml  Net   1480 ml   Net 1.58 L positive   General: Alert, awake, oriented x3, in no acute distress Neck:  JVP is normal Heart: Regular rate and rhythm, without murmurs, rubs, gallops.  Lungs: Mild wheezing bilaterally   Abdomen  Mild diffuse tenderness Exemities:  No edema.   Neuro: Grossly intact, nonfocal.   Tele  Afib   90s to 100s    Lab Results: Results for orders placed or performed during the hospital encounter of 04/05/14 (from the past 24 hour(s))  Glucose, capillary     Status: Abnormal   Collection Time: 04/05/14  4:11 PM  Result Value Ref Range   Glucose-Capillary 183 (H) 70 - 99 mg/dL  Troponin I-(serum)     Status: Abnormal   Collection Time: 04/05/14  7:44 PM  Result Value Ref Range   Troponin I 0.04 (H) <0.031 ng/mL  Glucose, capillary     Status: Abnormal   Collection Time: 04/05/14 10:10 PM  Result Value Ref Range   Glucose-Capillary 179 (H) 70 - 99 mg/dL  Troponin I-(serum)     Status: None   Collection Time: 04/06/14  1:47 AM  Result Value Ref Range   Troponin I 0.03 <0.031 ng/mL  Procalcitonin     Status: None   Collection Time: 04/06/14  1:47 AM  Result Value Ref Range   Procalcitonin 0.18 ng/mL  CBC with Differential     Status: Abnormal   Collection Time: 04/06/14  7:46 AM  Result Value Ref Range   WBC 8.7 4.0 - 10.5 K/uL   RBC 3.96 (L) 4.22 - 5.81 MIL/uL   Hemoglobin 13.7 13.0 - 17.0 g/dL   HCT 46.2 70.3 - 50.0 %   MCV 101.3 (H) 78.0 - 100.0 fL   MCH 34.6 (H)  26.0 - 34.0 pg   MCHC 34.2 30.0 - 36.0 g/dL   RDW 93.8 18.2 - 99.3 %   Platelets 95 (L) 150 - 400 K/uL   Neutrophils Relative % 66 43 - 77 %   Neutro Abs 5.8 1.7 - 7.7 K/uL   Lymphocytes Relative 24 12 - 46 %   Lymphs Abs 2.1 0.7 - 4.0 K/uL   Monocytes Relative 9 3 - 12 %   Monocytes Absolute 0.8 0.1 - 1.0 K/uL   Eosinophils Relative 1 0 - 5 %   Eosinophils Absolute 0.1 0.0 - 0.7 K/uL   Basophils Relative 0 0 - 1 %   Basophils Absolute 0.0 0.0 - 0.1 K/uL  Glucose, capillary     Status: Abnormal   Collection Time: 04/06/14  8:15 AM  Result Value Ref Range   Glucose-Capillary 183 (H) 70 - 99 mg/dL   Comment 1 Notify RN    Comment 2 Documented in Chart   Glucose, capillary  Status: Abnormal   Collection Time: 04/06/14 11:33 AM  Result Value Ref Range   Glucose-Capillary 162 (H) 70 - 99 mg/dL   Comment 1 Notify RN    Comment 2 Documented in Chart     Studies/Results: Dg Chest 2 View  04/05/2014   CLINICAL DATA:  Three-day history of cough  EXAM: CHEST  2 VIEW  COMPARISON:  Aug 18, 2012  FINDINGS: There is no edema or consolidation. Heart is upper normal in size with pulmonary vascularity within normal limits. Pacemaker lead tip is attached to the right ventricle. There is an apparent stent in the left anterior descending coronary artery. There is no appreciable adenopathy. There is atherosclerotic change in the aorta. There is degenerative change in the thoracic spine.  IMPRESSION: No edema or consolidation.   Electronically Signed   By: Bretta BangWilliam  Woodruff M.D.   On: 04/05/2014 07:09    Medications: Reviewed   @PROBHOSP @  1.  CAD with ischemic cardiomyopathy;  NO evid of active ischemia  Trivial elevation in troponin.  Need to watch IVF   If patient taking PO Check labs in AM  CHF meds on hold except for digoxin  2.  Atrial fibrillation  Rates are not bad  I would resume coumadin since no indicatoin for surgery or for actvie bleeding.    LOS: 1 day   Dietrich Patesaula Milady Fleener 04/06/2014,  2:26 PM

## 2014-04-06 NOTE — Progress Notes (Signed)
ANTIBIOTIC CONSULT NOTE - INITIAL  Pharmacy Consult for Vancomycin and Zosyn  Indication: rule out sepsis  Allergies  Allergen Reactions  . Lipitor [Atorvastatin] Other (See Comments)    myalgias   . Procaine Hcl Nausea And Vomiting  . Tramadol Nausea And Vomiting   Patient Measurements: Height: 5\' 5"  (165.1 cm) Weight: 181 lb 14.1 oz (82.5 kg) IBW/kg (Calculated) : 61.5  Vital Signs: Temp: 97.4 F (36.3 C) (12/30 1446) Temp Source: Oral (12/30 1446) BP: 118/71 mmHg (12/30 1446) Pulse Rate: 107 (12/30 1446) Intake/Output from previous day: 12/29 0701 - 12/30 0700 In: 2680 [P.O.:480; I.V.:2000; IV Piggyback:200] Out: 1200 [Urine:1200] Intake/Output from this shift: Total I/O In: 100 [P.O.:100] Out: -   Labs:  Recent Labs  04/05/14 0507 04/06/14 0746  WBC 10.4 8.7  HGB 15.3 13.7  PLT 105* 95*  CREATININE 1.08  --    Estimated Creatinine Clearance: 56.6 mL/min (by C-G formula based on Cr of 1.08). No results for input(s): VANCOTROUGH, VANCOPEAK, VANCORANDOM, GENTTROUGH, GENTPEAK, GENTRANDOM, TOBRATROUGH, TOBRAPEAK, TOBRARND, AMIKACINPEAK, AMIKACINTROU, AMIKACIN in the last 72 hours.   Microbiology: Recent Results (from the past 720 hour(s))  Blood culture (routine x 2)     Status: None (Preliminary result)   Collection Time: 04/05/14  6:11 AM  Result Value Ref Range Status   Specimen Description BLOOD LEFT ARM  Final   Special Requests BOTTLES DRAWN AEROBIC AND ANAEROBIC 8CC  Final   Culture NO GROWTH 1 DAY  Final   Report Status PENDING  Incomplete  Blood culture (routine x 2)     Status: None (Preliminary result)   Collection Time: 04/05/14  6:11 AM  Result Value Ref Range Status   Specimen Description BLOOD RIGHT ARM  Final   Special Requests BOTTLES DRAWN AEROBIC AND ANAEROBIC 8CC  Final   Culture NO GROWTH 1 DAY  Final   Report Status PENDING  Incomplete  Culture, Urine     Status: None   Collection Time: 04/05/14  8:31 AM  Result Value Ref Range  Status   Specimen Description URINE, CATHETERIZED  Final   Special Requests NONE  Final   Colony Count NO GROWTH Performed at [facility]   Final   Culture NO GROWTH Performed at [facility]   Final   Report Status 04/06/2014 FINAL  Final  Rapid strep screen     Status: None   Collection Time: 04/05/14  9:01 AM  Result Value Ref Range Status   Streptococcus, Group A Screen (Direct) NEGATIVE NEGATIVE Final    Comment: (NOTE) A Rapid Antigen test may result negative if the antigen level in the sample is below the detection level of this test. The FDA has not cleared this test as a stand-alone test therefore the rapid antigen negative result has reflexed to a Group A Strep culture.   Culture, Group A Strep     Status: None (Preliminary result)   Collection Time: 04/05/14  9:01 AM  Result Value Ref Range Status   Specimen Description THROAT  Final   Special Requests NONE  Final   Culture   Final    NO SUSPICIOUS COLONIES, CONTINUING TO HOLD Performed at [facility]    Report Status PENDING  Incomplete  MRSA PCR Screening     Status: None   Collection Time: 04/05/14  2:00 PM  Result Value Ref Range Status   MRSA by PCR NEGATIVE NEGATIVE Final    Comment:        The  GeneXpert MRSA Assay (FDA approved for NASAL specimens only), is one component of a comprehensive MRSA colonization surveillance program. It is not intended to diagnose MRSA infection nor to guide or monitor treatment for MRSA infections.    Medical History: Past Medical History  Diagnosis Date  . Arteriosclerotic cardiovascular disease (ASCVD)   . COPD (chronic obstructive pulmonary disease)   . Gout   . DJD (degenerative joint disease)   . Hyperlipidemia   . Chronic anticoagulation 2012    2012  . CHF (congestive heart failure)   . Atrial fibrillation     Onset in 2012  . Atrial flutter   . ICD (implantable cardiac defibrillator) in place   . Myocardial infarction 1998   . Ischemic cardiomyopathy   . Obstructive sleep apnea     "went away when I lost a bunch of weight" (08/17/2012)  . Type II diabetes mellitus   . Kidney stone     "just once" (08/17/2012)  . NICM (nonischemic cardiomyopathy), EF 25-30% 01-Sep-2012  . At risk for sudden cardiac death 2012-09-01  . Permanent atrial fibrillation 02/15/2011    Initial onset in 03/2011 with rapid ventricular response   . S/P ICD (internal cardiac defibrillator) procedure, 08/17/12, [employer] 09/01/2012    [employer]   Medications:  Scheduled:  . aspirin EC  81 mg Oral Daily  . digoxin  0.25 mg Oral Daily  . heparin  5,000 Units Subcutaneous 3 times per day  . insulin aspart  0-9 Units Subcutaneous TID AC & HS  . piperacillin-tazobactam (ZOSYN)  IV  3.375 g Intravenous 3 times per day  . tamsulosin  0.4 mg Oral QPC breakfast  . vancomycin  750 mg Intravenous Q12H  . warfarin  5 mg Oral Once  . [START ON 04/07/2014] Warfarin - Pharmacist Dosing Inpatient   Does not apply Q24H   Anti-infectives    Start     Dose/Rate Route Frequency Ordered Stop   04/06/14 1500  vancomycin (VANCOCIN) IVPB 750 mg/150 ml premix     750 mg150 mL/hr over 60 Minutes Intravenous Every 12 hours 04/06/14 1450     04/06/14 1500  piperacillin-tazobactam (ZOSYN) IVPB 3.375 g     3.375 g12.5 mL/hr over 240 Minutes Intravenous 3 times per day 04/06/14 1450     04/05/14 0730  vancomycin (VANCOCIN) IVPB 1000 mg/200 mL premix     1,000 mg200 mL/hr over 60 Minutes Intravenous  Once 04/05/14 0728 04/05/14 1315   04/05/14 0730  piperacillin-tazobactam (ZOSYN) IVPB 3.375 g     3.375 g12.5 mL/hr over 240 Minutes Intravenous  Once 04/05/14 0728 04/05/14 0932     Assessment: Okay for Protocol, initial doses given on 12/29 in ED.  79yo male with extensive cardiac history being treated for Sepsis  Goal of Therapy:  Eradicate infection.  Vancomycin trough level 15-20 mcg/ml  Plan:  Zosyn 3.375gm IV every 8 hours. Follow-up micro  data, labs, vitals. Vancomycin 750mg  IV every 12 hours. Measure antibiotic drug levels at steady state Follow up culture results  Mady Gemma 04/06/2014,2:58 PM

## 2014-04-06 NOTE — Progress Notes (Signed)
Report called to V. Dildy, LPN. Patient transferred to [id number] in stable condition via wheelchair.

## 2014-04-06 NOTE — Progress Notes (Signed)
ANTICOAGULATION CONSULT NOTE  Pharmacy Consult for Warfarin Indication: atrial fibrillation  Allergies  Allergen Reactions  . Lipitor [Atorvastatin] Other (See Comments)    myalgias   . Procaine Hcl Nausea And Vomiting  . Tramadol Nausea And Vomiting   Patient Measurements: Height: 5\' 5"  (165.1 cm) Weight: 181 lb 14.1 oz (82.5 kg) IBW/kg (Calculated) : 61.5  Vital Signs: Temp: 98.8 F (37.1 C) (12/30 1141) Temp Source: Oral (12/30 1141) BP: 94/70 mmHg (12/30 0900) Pulse Rate: 109 (12/30 0900)  Labs:  Recent Labs  04/05/14 0507  04/05/14 1359 04/05/14 1944 04/06/14 0147 04/06/14 0746  HGB 15.3  --   --   --   --  13.7  HCT 44.2  --   --   --   --  40.1  PLT 105*  --   --   --   --  95*  LABPROT 15.4*  --   --   --   --   --   INR 1.20  --   --   --   --   --   CREATININE 1.08  --   --   --   --   --   TROPONINI 0.06*  < > 0.04* 0.04* 0.03  --   < > = values in this interval not displayed.  Estimated Creatinine Clearance: 56.6 mL/min (by C-G formula based on Cr of 1.08).   Medical History: Past Medical History  Diagnosis Date  . Arteriosclerotic cardiovascular disease (ASCVD)   . COPD (chronic obstructive pulmonary disease)   . Gout   . DJD (degenerative joint disease)   . Hyperlipidemia   . Chronic anticoagulation 2012    2012  . CHF (congestive heart failure)   . Atrial fibrillation     Onset in 2012  . Atrial flutter   . ICD (implantable cardiac defibrillator) in place   . Myocardial infarction 1998  . Ischemic cardiomyopathy   . Obstructive sleep apnea     "went away when I lost a bunch of weight" (08/17/2012)  . Type II diabetes mellitus   . Kidney stone     "just once" (08/17/2012)  . NICM (nonischemic cardiomyopathy), EF 25-30% 2012/08/24  . At risk for sudden cardiac death Aug 24, 2012  . Permanent atrial fibrillation 02/15/2011    Initial onset in 03/2011 with rapid ventricular response   . S/P ICD (internal cardiac defibrillator) procedure,  08/17/12, [employer] 08-24-2012    [employer]   Medications:  Prescriptions prior to admission  Medication Sig Dispense Refill Last Dose  . carvedilol (COREG) 12.5 MG tablet Take 12.5 mg by mouth 2 (two) times daily with a meal.   04/04/2014 at 2030  . digoxin (LANOXIN) 0.25 MG tablet Take 1 tablet (0.25 mg total) by mouth daily. 30 tablet 3 04/04/2014 at Unknown time  . furosemide (LASIX) 40 MG tablet Take 1 tablet (40 mg total) by mouth daily. 30 tablet 10 04/04/2014 at Unknown time  . magnesium oxide (MAG-OX) 400 MG tablet Take 400 mg by mouth 2 (two) times daily.     04/04/2014 at Unknown time  . metFORMIN (GLUCOPHAGE) 500 MG tablet Take 500 mg by mouth 2 (two) times daily with a meal.    04/04/2014 at Unknown time  . Omega-3 Fatty Acids (FISH OIL BURP-LESS) 1000 MG CAPS Take 1,200 mg by mouth 2 (two) times daily.    04/04/2014 at Unknown time  . pravastatin (PRAVACHOL) 40 MG tablet Take 40 mg by mouth daily.  04/04/2014 at Unknown time  . Tamsulosin HCl (FLOMAX) 0.4 MG CAPS Take 0.4 mg by mouth daily after breakfast.   04/04/2014 at Unknown time  . warfarin (COUMADIN) 4 MG tablet Take 2-4 mg by mouth See admin instructions. Takes 2mg  for two days, then 4mg  (1 tablet) one day.   04/04/2014 at 1000  . ondansetron (ZOFRAN) 4 MG tablet Take 1 tablet (4 mg total) by mouth every 6 (six) hours as needed for nausea. (Patient not taking: Reported on 04/05/2014) 4 tablet 0 Taking  . ondansetron (ZOFRAN) 4 MG tablet Take 1 tablet (4 mg total) by mouth every 6 (six) hours as needed for nausea. (Patient not taking: Reported on 04/05/2014) 12 tablet 0 Taking  . ONE TOUCH ULTRA TEST test strip    Taking  . oxyCODONE-acetaminophen (PERCOCET/ROXICET) 5-325 MG per tablet Take 1 tablet by mouth every 4 (four) hours as needed for severe pain. (Patient not taking: Reported on 04/05/2014) 20 tablet 0 Taking  . oxyCODONE-acetaminophen (PERCOCET/ROXICET) 5-325 MG per tablet Take 1 tablet by mouth every  4 (four) hours as needed. (Patient not taking: Reported on 04/05/2014) 6 tablet 0 Not Taking  . oxyCODONE-acetaminophen (PERCOCET/ROXICET) 5-325 MG per tablet Take 1 tablet by mouth every 4 (four) hours as needed. (Patient not taking: Reported on 04/05/2014) 20 tablet 0 Taking  . tamsulosin (FLOMAX) 0.4 MG CAPS capsule Take 1 capsule (0.4 mg total) by mouth daily. (Patient not taking: Reported on 04/05/2014) 10 capsule 0 Not Taking    Assessment: Okay for Protocol, INR sub-therapeutic on admission.  79 y/o ? diag Afib 02/15/11 CHad2Vasc2 score= 4.  Resume coumadin since no indicatoin for surgery or for actvie bleeding per Cards.   Goal of Therapy:  INR 2-3   Plan:  Warfarin 5mg  PO x 1. Daily PT/INR.  Lamonte RicherHayes, Ashtan Laton R 04/06/2014,2:40 PM

## 2014-04-06 NOTE — Care Management Note (Addendum)
    Page 1 of 1   04/07/2014     12:55:05 PM CARE MANAGEMENT NOTE 04/07/2014  Patient:  [name], [name]   Account Number:  [id number]  Date Initiated:  04/06/2014  Documentation initiated by:  CHILDRESS,JESSICA  Subjective/Objective Assessment:   Pt is admitted with viral illness. Pt is from home with self care. Pt is a retired [employer] and independent at home with self care. Pt lives with wife. Pt plans to dishcarge home with self care. No CM needs at this time.     Action/Plan:   Anticipated DC Date:  04/07/2014   Anticipated DC Plan:  HOME/SELF CARE      DC Planning Services  CM consult      Choice offered to / List presented to:             Status of service:  Completed, signed off Medicare Important Message given?   (If response is "NO", the following Medicare IM given date fields will be blank) Date Medicare IM given:   Medicare IM given by:   Date Additional Medicare IM given:   Additional Medicare IM given by:    Discharge Disposition:  HOME/SELF CARE  Per UR Regulation:  Reviewed for med. necessity/level of care/duration of stay  If discussed at Long Length of Stay Meetings, dates discussed:    Comments:  04/07/2014 1200 Kathyrn Sheriff, RN, MSN, PCCN Pt discharging home with self care. No CM needs at this time. 04/06/2014 0900 Kathyrn Sheriff, RN, MSN, PCCN.

## 2014-04-06 NOTE — Progress Notes (Signed)
Patient alert and oriented this morning systolic of 98 mmHg no significant dyspnea no rigors no chills. Lactic acid within normal limits progesterone minimally elevated 0.18 . Currently on vancomycin and [name] PHX:[id number] DOB: Jun 09, 1934 DOA: 04/05/2014 PCP: [name] Curet, MD             Physical Exam: Blood pressure 116/68, pulse 53, temperature 97.7 F (36.5 C), temperature source Oral, resp. rate 31, height _0  (1.651 [name]), weight 181 lb 14.1 oz (82.5 kg), SpO2 96 %. neck no JVD no carotid bruits no thyromegaly lungs diminished breath sounds in the bases prolonged phase scattered rhonchi no rales appreciable irregular regular no S3 auscultated no heaves or thrills rubs abdomen soft nontender bowel sounds normoactive   Investigations:  Recent Results (from the past 240 hour(s))  Blood culture (routine x 2)     Status: None (Preliminary result)   Collection Time: 04/05/14  6:11 AM  Result Value Ref Range Status   Specimen Description Blood  Final   Special Requests Normal  Final   Culture NO GROWTH <24 HRS  Final   Report Status PENDING  Incomplete  Blood culture (routine x 2)     Status: None (Preliminary result)   Collection Time: 04/05/14  6:11 AM  Result Value Ref Range Status   Specimen Description Blood  Final   Special Requests Normal  Final   Culture NO GROWTH <24 HRS  Final   Report Status PENDING  Incomplete  Rapid strep screen     Status: None   Collection Time: 04/05/14  9:01 AM  Result Value Ref Range Status   Streptococcus, Group A Screen (Direct) NEGATIVE NEGATIVE Final    Comment: (NOTE) A Rapid Antigen test may result negative if the antigen level in the sample is below the detection level of this test. The FDA has not cleared this test as a stand-alone test therefore the rapid antigen negative result has reflexed to a Group A Strep culture.   MRSA PCR Screening     Status: None   Collection Time: 04/05/14  2:00 PM  Result Value  Ref Range Status   MRSA by PCR NEGATIVE NEGATIVE Final    Comment:        The GeneXpert MRSA Assay (FDA approved for NASAL specimens only), is one component of a comprehensive MRSA colonization surveillance program. It is not intended to diagnose MRSA infection nor to guide or monitor treatment for MRSA infections.      Basic Metabolic Panel:  Recent Labs  04/05/14 0507  NA 133*  K 4.1  CL 97  CO2 25  GLUCOSE 219*  BUN 16  CREATININE 1.08  CALCIUM 8.8   Liver Function Tests:  Recent Labs  04/05/14 0507  AST 41*  ALT 27  ALKPHOS 39  BILITOT 1.6*  PROT 6.4  ALBUMIN 3.7     CBC:  Recent Labs  04/05/14 0507  WBC 10.4  NEUTROABS 7.4  HGB 15.3  HCT 44.2  MCV 100.7*  PLT 105*    Dg Chest 2 View  04/05/2014   CLINICAL DATA:  Three-day history of cough  EXAM: CHEST  2 VIEW  COMPARISON:  Aug 18, 2012  FINDINGS: There is no edema or consolidation. Heart is upper normal in size with pulmonary vascularity within normal limits. Pacemaker lead tip is attached to the right ventricle. There is an apparent stent in the left anterior descending coronary artery. There is no appreciable adenopathy. There is atherosclerotic change in  the aorta. There is degenerative change in the thoracic spine.  IMPRESSION: No edema or consolidation.   Electronically Signed   By: Lowella Grip [name].D.   On: 04/05/2014 07:09      Medications:   Impression:  Principal Problem:   ACS (acute coronary syndrome) Active Problems:   Permanent atrial fibrillation   NICM (nonischemic cardiomyopathy), EF 25-30%   At risk for sudden cardiac death   S/P ICD (internal cardiac defibrillator) procedure, 08/17/12, [employer] implanted   Chronic combined systolic and diastolic CHF, NYHA class 2   Sepsis     Plan: Continue dual antibiotics at present monitor CBC and be met today as well as serum magnesium level.  Consultants: Cardiology   Procedures   Antibiotics: Vancomycin  and Zosyn                  Code Status: Full   Family Communication:  Spoke with  Disposition Plan   Time spent: 30 minutes   LOS: 1 day   [name]   04/06/2014, 7:08 AM

## 2014-04-07 LAB — CULTURE, GROUP A STREP

## 2014-04-07 LAB — BASIC METABOLIC PANEL
Anion gap: 9 (ref 5–15)
BUN: 8 mg/dL (ref 6–23)
CO2: 24 mmol/L (ref 19–32)
CREATININE: 0.82 mg/dL (ref 0.50–1.35)
Calcium: 7.9 mg/dL — ABNORMAL LOW (ref 8.4–10.5)
Chloride: 101 mEq/L (ref 96–112)
GFR calc Af Amer: >90 mL/min (ref >90)
GFR, EST NON AFRICAN AMERICAN: 83 mL/min — AB (ref >90)
GLUCOSE: 176 mg/dL — AB (ref 70–99)
Potassium: 3.4 mmol/L — ABNORMAL LOW (ref 3.5–5.1)
SODIUM: 134 mmol/L — AB (ref 135–145)

## 2014-04-07 LAB — PROTIME-INR
INR: 1.28 (ref 0.00–1.49)
PROTHROMBIN TIME: 16.1 s — AB (ref 11.6–15.2)

## 2014-04-07 LAB — BRAIN NATRIURETIC PEPTIDE: B Natriuretic Peptide: 601 pg/mL — ABNORMAL HIGH (ref 0.0–100.0)

## 2014-04-07 LAB — PROCALCITONIN

## 2014-04-07 LAB — GLUCOSE, CAPILLARY
Glucose-Capillary: 179 mg/dL — ABNORMAL HIGH (ref 70–99)
Glucose-Capillary: 255 mg/dL — ABNORMAL HIGH (ref 70–99)

## 2014-04-07 MED ORDER — WARFARIN SODIUM 5 MG PO TABS: 5.0000 mg | ORAL_TABLET | Freq: Once | ORAL | Status: DC

## 2014-04-07 MED ORDER — FUROSEMIDE 10 MG/ML IJ SOLN: 40.0000 mg | Freq: Two times a day (BID) | INTRAMUSCULAR | Status: DC

## 2014-04-07 MED ORDER — AZITHROMYCIN 250 MG PO TABS: ORAL_TABLET | ORAL | Status: DC

## 2014-04-07 MED ORDER — VANCOMYCIN HCL IN DEXTROSE 1-5 GM/200ML-% IV SOLN
1000.0000 mg | Freq: Two times a day (BID) | INTRAVENOUS | Status: DC
  Filled 2014-04-07 (×8): qty 200

## 2014-04-07 MED ORDER — PRAVASTATIN SODIUM 40 MG PO TABS
40.0000 mg | ORAL_TABLET | Freq: Every day | ORAL | Status: DC
  Administered 2014-04-07: 40 mg via ORAL
  Filled 2014-04-07: qty 1

## 2014-04-07 MED ORDER — CARVEDILOL 3.125 MG PO TABS: 3.1250 mg | ORAL_TABLET | Freq: Two times a day (BID) | ORAL | Status: DC

## 2014-04-07 MED ORDER — AZITHROMYCIN 250 MG PO TABS
250.0000 mg | ORAL_TABLET | Freq: Every day | ORAL | Status: DC
  Administered 2014-04-07: 250 mg via ORAL
  Filled 2014-04-07: qty 1

## 2014-04-07 NOTE — Progress Notes (Signed)
Inpatient Diabetes Program Recommendations  AACE/ADA: New Consensus Statement on Inpatient Glycemic Control (2013)  Target Ranges:  Prepandial:   less than 140 mg/dL      Peak postprandial:   less than 180 mg/dL (1-2 hours)      Critically ill patients:  140 - 180 mg/dL   Results for BYNUM, REIGEL (MRN [id number]) as of 04/07/2014 11:56  Ref. Range 04/06/2014 08:15 04/06/2014 11:33 04/06/2014 17:51 04/06/2014 21:01 04/07/2014 07:28 04/07/2014 11:03  Glucose-Capillary Latest Range: 70-99 mg/dL 449 (H) 753 (H) 005 (H) 202 (H) 179 (H) 255 (H)   Diabetes history: DM2 Outpatient Diabetes medications: Metformin 500 mg BID Current orders for Inpatient glycemic control: Novolog 0-9 units TID with meals  Inpatient Diabetes Program Recommendations Insulin - Meal Coverage: Please consider ordering Novolog 3 units TID with meals for meal coverage (in addition to Novolog correction scale).  Thanks, Orlando Penner, RN, MSN, CCRN, CDE Diabetes Coordinator Inpatient Diabetes Program [phone] (Team Pager) [phone] ([facility] office) [phone] [facility] office)

## 2014-04-07 NOTE — Discharge Summary (Signed)
Physician Discharge Summary  [name] JYN:[id number] DOB: 1934-09-23 DOA: 04/05/2014  PCP: [name], MD  Admit date: 04/05/2014 Discharge date: 04/07/2014   Recommendations for Outpatient Follow-up:  Patient will follow up one week's time to assess hemodynamic stability and anticoagulation Discharge Diagnoses:  Principal Problem:   ACS (acute coronary syndrome) Active Problems:   Permanent atrial fibrillation   NICM (nonischemic cardiomyopathy), EF 25-30%   At risk for sudden cardiac death   S/P ICD (internal cardiac defibrillator) procedure, 08/17/12, [facility] implanted   Chronic combined systolic and diastolic CHF, NYHA class 2   Sepsis   Discharge Condition: Good  Filed Weights   04/05/14 1319 04/06/14 0400 04/07/14 0604  Weight: 178 lb 9.2 oz (81 kg) 181 lb 14.1 oz (82.5 kg) 184 lb 11.9 oz (83.8 kg)    History of present illness:  Patient was admitted with hypotension leukocytosis cough consideration given to septicemia however he has an ischemic cardiomyopathy ejection fraction 30% with somewhat hypotensive and treated with vancomycin and Zosyn empirically for possible impending septicemia likewise had minimally elevated troponins turned out not to be in MI 2-D echo revealed EF globally of 30% with multiple regional wall motion abnormalities socially weaned off IV antibiotics and placed on Zithromax empirically was never any objective data evidence of septicemia  Hospital Course:  Patient was treated with IV fluids IV vancomycin and Zosyn during hospital stay for impending septicemia minimally elevated troponins ruled out by cardiology as an MI rather possible demand ischemia and he was hemodynamically stable thereafter anticoagulation was held for 48 hours and resumed  Procedures:  None  Consultations:  Cardiology  Discharge Instructions  Discharge Instructions    Discharge instructions    Complete by:  As directed      Discharge  patient    Complete by:  As directed             Medication List    STOP taking these medications        ondansetron 4 MG tablet  Commonly known as:  ZOFRAN      TAKE these medications        azithromycin 250 MG tablet  Commonly known as:  ZITHROMAX  One tab Orally daily for 5 days     carvedilol 12.5 MG tablet  Commonly known as:  COREG  Take 12.5 mg by mouth 2 (two) times daily with a meal.     digoxin 0.25 MG tablet  Commonly known as:  LANOXIN  Take 1 tablet (0.25 mg total) by mouth daily.     FISH OIL BURP-LESS 1000 MG Caps  Take 1,200 mg by mouth 2 (two) times daily.     furosemide 40 MG tablet  Commonly known as:  LASIX  Take 1 tablet (40 mg total) by mouth daily.     magnesium oxide 400 MG tablet  Commonly known as:  MAG-OX  Take 400 mg by mouth 2 (two) times daily.     metFORMIN 500 MG tablet  Commonly known as:  GLUCOPHAGE  Take 500 mg by mouth 2 (two) times daily with a meal.     ONE TOUCH ULTRA TEST test strip  Generic drug:  glucose blood     oxyCODONE-acetaminophen 5-325 MG per tablet  Commonly known as:  PERCOCET/ROXICET  Take 1 tablet by mouth every 4 (four) hours as needed for severe pain.     pravastatin 40 MG tablet  Commonly known as:  PRAVACHOL  Take 40 mg by  mouth daily.     tamsulosin 0.4 MG Caps capsule  Commonly known as:  FLOMAX  Take 1 capsule (0.4 mg total) by mouth daily.     warfarin 5 MG tablet  Commonly known as:  COUMADIN  Take 1 tablet (5 mg total) by mouth once.       Allergies  Allergen Reactions  . Lipitor [Atorvastatin] Other (See Comments)    myalgias   . Procaine Hcl Nausea And Vomiting  . Tramadol Nausea And Vomiting       Follow-up Information    Follow up with Lennette Bihari, MD.   Specialty:  Cardiology   Why:  need referral for out pt cardiac rehab   Contact information:   [address] [place] [address] [phone]        The results of significant diagnostics from  this hospitalization (including imaging, microbiology, ancillary and laboratory) are listed below for reference.    Significant Diagnostic Studies: Dg Chest 2 View  04/05/2014   CLINICAL DATA:  Three-day history of cough  EXAM: CHEST  2 VIEW  COMPARISON:  Aug 18, 2012  FINDINGS: There is no edema or consolidation. Heart is upper normal in size with pulmonary vascularity within normal limits. Pacemaker lead tip is attached to the right ventricle. There is an apparent stent in the left anterior descending coronary artery. There is no appreciable adenopathy. There is atherosclerotic change in the aorta. There is degenerative change in the thoracic spine.  IMPRESSION: No edema or consolidation.   Electronically Signed   By: Bretta Bang M.D.   On: 04/05/2014 07:09    Microbiology: Recent Results (from the past 240 hour(s))  Blood culture (routine x 2)     Status: None (Preliminary result)   Collection Time: 04/05/14  6:11 AM  Result Value Ref Range Status   Specimen Description BLOOD LEFT ARM  Final   Special Requests BOTTLES DRAWN AEROBIC AND ANAEROBIC 8CC  Final   Culture NO GROWTH 2 DAYS  Final   Report Status PENDING  Incomplete  Blood culture (routine x 2)     Status: None (Preliminary result)   Collection Time: 04/05/14  6:11 AM  Result Value Ref Range Status   Specimen Description BLOOD RIGHT ARM  Final   Special Requests BOTTLES DRAWN AEROBIC AND ANAEROBIC 8CC  Final   Culture NO GROWTH 2 DAYS  Final   Report Status PENDING  Incomplete  Culture, Urine     Status: None   Collection Time: 04/05/14  8:31 AM  Result Value Ref Range Status   Specimen Description URINE, CATHETERIZED  Final   Special Requests NONE  Final   Colony Count NO GROWTH Performed at [facility]   Final   Culture NO GROWTH Performed at [facility]   Final   Report Status 04/06/2014 FINAL  Final  Rapid strep screen     Status: None   Collection Time: 04/05/14  9:01 AM  Result Value Ref  Range Status   Streptococcus, Group A Screen (Direct) NEGATIVE NEGATIVE Final    Comment: (NOTE) A Rapid Antigen test may result negative if the antigen level in the sample is below the detection level of this test. The FDA has not cleared this test as a stand-alone test therefore the rapid antigen negative result has reflexed to a Group A Strep culture.   Culture, Group A Strep     Status: None (Preliminary result)   Collection Time: 04/05/14  9:01 AM  Result Value Ref Range Status   Specimen Description THROAT  Final   Special Requests NONE  Final   Culture   Final    NO SUSPICIOUS COLONIES, CONTINUING TO HOLD [facility]formed at [facility]    Report Status PENDING  Incomplete  MRSA PCR Screening     Status: None   Collection Time: 04/05/14  2:00 PM  Result Value Ref Range Status   MRSA by PCR NEGATIVE NEGATIVE Final    Comment:        The GeneXpert MRSA Assay (FDA approved for NASAL specimens only), is one component of a comprehensive MRSA colonization surveillance program. It is not intended to diagnose MRSA infection nor to guide or monitor treatment for MRSA infections.      Labs: Basic Metabolic Panel:  Recent Labs Lab 04/05/14 0507 04/07/14 0523  NA 133* 134*  K 4.1 3.4*  CL 97 101  CO2 25 24  GLUCOSE 219* 176*  BUN 16 8  CREATININE 1.08 0.82  CALCIUM 8.8 7.9*   Liver Function Tests:  Recent Labs Lab 04/05/14 0507  AST 41*  ALT 27  ALKPHOS 39  BILITOT 1.6*  PROT 6.4  ALBUMIN 3.7    Recent Labs Lab 04/05/14 0507  LIPASE 15   No results for input(s): AMMONIA in the last 168 hours. CBC:  Recent Labs Lab 04/05/14 0507 04/06/14 0746  WBC 10.4 8.7  NEUTROABS 7.4 5.8  HGB 15.3 13.7  HCT 44.2 40.1  MCV 100.7* 101.3*  PLT 105* 95*   Cardiac Enzymes:  Recent Labs Lab 04/05/14 0507 04/05/14 0849 04/05/14 1359 04/05/14 1944 04/06/14 0147  TROPONINI 0.06* 0.05* 0.04* 0.04* 0.03   BNP: BNP (last 3 results) No results for  input(s): PROBNP in the last 8760 hours. CBG:  Recent Labs Lab 04/06/14 1133 04/06/14 1751 04/06/14 2101 04/07/14 0728 04/07/14 1103  GLUCAP 162* 189* 202* 179* 255*       Signed:  Sedonia Kitner M  [facility] Pager: [phone] 04/07/2014, 12:03 PM

## 2014-04-07 NOTE — Progress Notes (Addendum)
Subjective: Patinet says he is feeling a lot better than when he came in  Slept through the night last night.  No CP  Breathing is OK in bed.   Objective: Filed Vitals:   04/06/14 1141 04/06/14 1446 04/06/14 2035 04/07/14 0604  BP:  118/71 130/66 136/78  Pulse:  107 91 86  Temp: 98.8 F (37.1 C) 97.4 F (36.3 C) 97.7 F (36.5 C) 99.1 F (37.3 C)  TempSrc: Oral Oral Oral Oral  Resp:   24 22  Height:      Weight:    184 lb 11.9 oz (83.8 kg)  SpO2:  99% 99% 99%   Weight change: 6 lb 2.8 oz (2.8 kg)  Intake/Output Summary (Last 24 hours) at 04/07/14 1025 Last data filed at 04/07/14 0700  Gross per 24 hour  Intake   2998 ml  Output    625 ml  Net   2373 ml   I/O incomplete  ?3.8 L positive   General: Alert, awake, oriented x3, in no acute distress Neck:  JVP is increased   Heart: irregular rate and rhythm, without murmurs, rubs, gallops.  Lungs: Wheezes  Rales at bases   Abd: Mild diffuse tenderness   Exemities:  Tr edema.   Neuro: Grossly intact, nonfocal.  Tele:  Afib 80s to 120s  Avg less than 100   Lab Results: Results for orders placed or performed during the hospital encounter of 04/05/14 (from the past 24 hour(s))  Glucose, capillary     Status: Abnormal   Collection Time: 04/06/14 11:33 AM  Result Value Ref Range   Glucose-Capillary 162 (H) 70 - 99 mg/dL   Comment 1 Notify RN    Comment 2 Documented in Chart   Glucose, capillary     Status: Abnormal   Collection Time: 04/06/14  5:51 PM  Result Value Ref Range   Glucose-Capillary 189 (H) 70 - 99 mg/dL   Comment 1 Notify RN    Comment 2 Documented in Chart   Glucose, capillary     Status: Abnormal   Collection Time: 04/06/14  9:01 PM  Result Value Ref Range   Glucose-Capillary 202 (H) 70 - 99 mg/dL   Comment 1 Notify RN    Comment 2 Documented in Chart   Procalcitonin     Status: None   Collection Time: 04/07/14  5:23 AM  Result Value Ref Range   Procalcitonin <0.10 ng/mL  Basic metabolic panel      Status: Abnormal   Collection Time: 04/07/14  5:23 AM  Result Value Ref Range   Sodium 134 (L) 135 - 145 mmol/L   Potassium 3.4 (L) 3.5 - 5.1 mmol/L   Chloride 101 96 - 112 mEq/L   CO2 24 19 - 32 mmol/L   Glucose, Bld 176 (H) 70 - 99 mg/dL   BUN 8 6 - 23 mg/dL   Creatinine, Ser 3.81 0.50 - 1.35 mg/dL   Calcium 7.9 (L) 8.4 - 10.5 mg/dL   GFR calc non Af Amer 83 (L) >90 mL/min   GFR calc Af Amer >90 >90 mL/min   Anion gap 9 5 - 15  Protime-INR     Status: Abnormal   Collection Time: 04/07/14  5:23 AM  Result Value Ref Range   Prothrombin Time 16.1 (H) 11.6 - 15.2 seconds   INR 1.28 0.00 - 1.49  Glucose, capillary     Status: Abnormal   Collection Time: 04/07/14  7:28 AM  Result Value Ref Range  Glucose-Capillary 179 (H) 70 - 99 mg/dL    Studies/Results: No results found.  Medications: Reviewed   @PROBHOSP @  1.  CAD  No symptoms to sugg active ischemia.   2  Acute on chronic systolic CHF    Volume status appears on exam  Would diurese now.  Would give 40 IV lasix bid now  (came in on 40 po qd)  Would resume coreg at low dose.  Titrate as bp tolerates.    3.  Atrial fibrillaton.  Rate control and anticoagulaton.  Follow INR    4.  HL  Resume pravastatin.    LOS: 2 days   Dietrich Patesaula Keirstan Iannello 04/07/2014, 10:25 AM

## 2014-04-07 NOTE — Progress Notes (Signed)
Patient discharged with instructions given on medications ,and follow up visits,patient,and family verbalized understanding. Prescription sent to Pharmacy of choice. Accompanied by staff to vehicle.

## 2014-04-07 NOTE — Progress Notes (Signed)
ANTIBIOTIC CONSULT NOTE - follow up  Pharmacy Consult for Vancomycin and Zosyn  Indication: rule out sepsis  Allergies  Allergen Reactions  . Lipitor [Atorvastatin] Other (See Comments)    myalgias   . Procaine Hcl Nausea And Vomiting  . Tramadol Nausea And Vomiting   Patient Measurements: Height: 5\' 5"  (165.1 cm) Weight: 184 lb 11.9 oz (83.8 kg) IBW/kg (Calculated) : 61.5  Vital Signs: Temp: 99.1 F (37.3 C) (12/31 0604) Temp Source: Oral (12/31 0604) BP: 136/78 mmHg (12/31 0604) Pulse Rate: 86 (12/31 0604) Intake/Output from previous day: 12/30 0701 - 12/31 0700 In: 2998 [P.O.:340; I.V.:2208; IV Piggyback:450] Out: 625 [Urine:625] Intake/Output from this shift:    Labs:  Recent Labs  04/05/14 0507 04/06/14 0746 04/07/14 0523  WBC 10.4 8.7  --   HGB 15.3 13.7  --   PLT 105* 95*  --   CREATININE 1.08  --  0.82   Estimated Creatinine Clearance: 75.1 mL/min (by C-G formula based on Cr of 0.82). No results for input(s): VANCOTROUGH, VANCOPEAK, VANCORANDOM, GENTTROUGH, GENTPEAK, GENTRANDOM, TOBRATROUGH, TOBRAPEAK, TOBRARND, AMIKACINPEAK, AMIKACINTROU, AMIKACIN in the last 72 hours.   Microbiology: Recent Results (from the past 720 hour(s))  Blood culture (routine x 2)     Status: None (Preliminary result)   Collection Time: 04/05/14  6:11 AM  Result Value Ref Range Status   Specimen Description BLOOD LEFT ARM  Final   Special Requests BOTTLES DRAWN AEROBIC AND ANAEROBIC 8CC  Final   Culture NO GROWTH 1 DAY  Final   Report Status PENDING  Incomplete  Blood culture (routine x 2)     Status: None (Preliminary result)   Collection Time: 04/05/14  6:11 AM  Result Value Ref Range Status   Specimen Description BLOOD RIGHT ARM  Final   Special Requests BOTTLES DRAWN AEROBIC AND ANAEROBIC 8CC  Final   Culture NO GROWTH 1 DAY  Final   Report Status PENDING  Incomplete  Culture, Urine     Status: None   Collection Time: 04/05/14  8:31 AM  Result Value Ref Range Status    Specimen Description URINE, CATHETERIZED  Final   Special Requests NONE  Final   Colony Count NO GROWTH Performed at [facility]   Final   Culture NO GROWTH Performed at [facility]   Final   Report Status 04/06/2014 FINAL  Final  Rapid strep screen     Status: None   Collection Time: 04/05/14  9:01 AM  Result Value Ref Range Status   Streptococcus, Group A Screen (Direct) NEGATIVE NEGATIVE Final    Comment: (NOTE) A Rapid Antigen test may result negative if the antigen level in the sample is below the detection level of this test. The FDA has not cleared this test as a stand-alone test therefore the rapid antigen negative result has reflexed to a Group A Strep culture.   Culture, Group A Strep     Status: None (Preliminary result)   Collection Time: 04/05/14  9:01 AM  Result Value Ref Range Status   Specimen Description THROAT  Final   Special Requests NONE  Final   Culture   Final    NO SUSPICIOUS COLONIES, CONTINUING TO HOLD Performed at [facility]    Report Status PENDING  Incomplete  MRSA PCR Screening     Status: None   Collection Time: 04/05/14  2:00 PM  Result Value Ref Range Status   MRSA by PCR NEGATIVE NEGATIVE Final    Comment:  The GeneXpert MRSA Assay (FDA approved for NASAL specimens only), is one component of a comprehensive MRSA colonization surveillance program. It is not intended to diagnose MRSA infection nor to guide or monitor treatment for MRSA infections.    Medical History: Past Medical History  Diagnosis Date  . Arteriosclerotic cardiovascular disease (ASCVD)   . COPD (chronic obstructive pulmonary disease)   . Gout   . DJD (degenerative joint disease)   . Hyperlipidemia   . Chronic anticoagulation 2012    2012  . CHF (congestive heart failure)   . Atrial fibrillation     Onset in 2012  . Atrial flutter   . ICD (implantable cardiac defibrillator) in place   . Myocardial infarction 1998  .  Ischemic cardiomyopathy   . Obstructive sleep apnea     "went away when I lost a bunch of weight" (08/17/2012)  . Type II diabetes mellitus   . Kidney stone     "just once" (08/17/2012)  . NICM (nonischemic cardiomyopathy), EF 25-30% 08/18/2012  . At risk for sudden cardiac death 08/18/2012  . Permanent atrial fibrillation 02/15/2011    Initial onset in 03/2011 with rapid ventricular response   . S/P ICD (internal cardiac defibrillator) procedure, 08/17/12, [facility] 08/18/2012    [facility]   Medications:  Scheduled:  . aspirin EC  81 mg Oral Daily  . digoxin  0.25 mg Oral Daily  . heparin  5,000 Units Subcutaneous 3 times per day  . insulin aspart  0-9 Units Subcutaneous TID AC & HS  . piperacillin-tazobactam (ZOSYN)  IV  3.375 g Intravenous 3 times per day  . tamsulosin  0.4 mg Oral QPC breakfast  . vancomycin  1,000 mg Intravenous Q12H  . Warfarin - Pharmacist Dosing Inpatient   Does not apply Q24H   Anti-infectives    Start     Dose/Rate Route Frequency Ordered Stop   04/07/14 1800  vancomycin (VANCOCIN) IVPB 1000 mg/200 mL premix     1,000 mg200 mL/hr over 60 Minutes Intravenous Every 12 hours 04/07/14 0858     04/06/14 1500  vancomycin (VANCOCIN) IVPB 750 mg/150 ml premix  Status:  Discontinued     750 mg150 mL/hr over 60 Minutes Intravenous Every 12 hours 04/06/14 1450 04/07/14 0858   04/06/14 1500  piperacillin-tazobactam (ZOSYN) IVPB 3.375 g     3.375 g12.5 mL/hr over 240 Minutes Intravenous 3 times per day 04/06/14 1450     04/05/14 0730  vancomycin (VANCOCIN) IVPB 1000 mg/200 mL premix     1,000 mg200 mL/hr over 60 Minutes Intravenous  Once 04/05/14 0728 04/05/14 1315   04/05/14 0730  piperacillin-tazobactam (ZOSYN) IVPB 3.375 g     3.375 g12.5 mL/hr over 240 Minutes Intravenous  Once 04/05/14 0728 04/05/14 0932     Assessment: Okay for Protocol, initial doses given on 12/29 in ED.  79yo male with extensive cardiac history being treated for Sepsis.  Wt =  83.8Kg.  Estimated Creatinine Clearance: 75.1 mL/min (by C-G formula based on Cr of 0.82).  Goal of Therapy:  Eradicate infection.  Vancomycin trough level 15-20 mcg/ml  Avoid adverse reactions  Plan:  Zosyn 3.375gm IV every 8 hours. Follow-up micro data, labs, vitals. Vancomycin 1000mg  IV every 12 hours. Measure antibiotic drug levels at steady state Follow up culture results  Valrie HartHall, Kenzee Bassin A 04/07/2014,8:58 AM

## 2014-04-07 NOTE — Progress Notes (Signed)
ANTICOAGULATION CONSULT NOTE  Pharmacy Consult for Warfarin Indication: atrial fibrillation  Allergies  Allergen Reactions  . Lipitor [Atorvastatin] Other (See Comments)    myalgias   . Procaine Hcl Nausea And Vomiting  . Tramadol Nausea And Vomiting   Patient Measurements: Height: 5\' 5"  (165.1 cm) Weight: 184 lb 11.9 oz (83.8 kg) IBW/kg (Calculated) : 61.5  Vital Signs: Temp: 99.1 F (37.3 C) (12/31 0604) Temp Source: Oral (12/31 0604) BP: 136/78 mmHg (12/31 0604) Pulse Rate: 86 (12/31 0604)  Labs:  Recent Labs  04/05/14 0507  04/05/14 1359 04/05/14 1944 04/06/14 0147 04/06/14 0746 04/07/14 0523  HGB 15.3  --   --   --   --  13.7  --   HCT 44.2  --   --   --   --  40.1  --   PLT 105*  --   --   --   --  95*  --   LABPROT 15.4*  --   --   --   --   --  16.1*  INR 1.20  --   --   --   --   --  1.28  CREATININE 1.08  --   --   --   --   --  0.82  TROPONINI 0.06*  < > 0.04* 0.04* 0.03  --   --   < > = values in this interval not displayed.  Estimated Creatinine Clearance: 75.1 mL/min (by C-G formula based on Cr of 0.82).   Medical History: Past Medical History  Diagnosis Date  . Arteriosclerotic cardiovascular disease (ASCVD)   . COPD (chronic obstructive pulmonary disease)   . Gout   . DJD (degenerative joint disease)   . Hyperlipidemia   . Chronic anticoagulation 2012    2012  . CHF (congestive heart failure)   . Atrial fibrillation     Onset in 2012  . Atrial flutter   . ICD (implantable cardiac defibrillator) in place   . Myocardial infarction 1998  . Ischemic cardiomyopathy   . Obstructive sleep apnea     "went away when I lost a bunch of weight" (08/17/2012)  . Type II diabetes mellitus   . Kidney stone     "just once" (08/17/2012)  . NICM (nonischemic cardiomyopathy), EF 25-30% 08/18/2012  . At risk for sudden cardiac death 08/18/2012  . Permanent atrial fibrillation 02/15/2011    Initial onset in 03/2011 with rapid ventricular response   . S/P  ICD (internal cardiac defibrillator) procedure, 08/17/12, [employer] [employer] 08/18/2012    [facility]   Medications:  Prescriptions prior to admission  Medication Sig Dispense Refill Last Dose  . carvedilol (COREG) 12.5 MG tablet Take 12.5 mg by mouth 2 (two) times daily with a meal.   04/04/2014 at 2030  . digoxin (LANOXIN) 0.25 MG tablet Take 1 tablet (0.25 mg total) by mouth daily. 30 tablet 3 04/04/2014 at Unknown time  . furosemide (LASIX) 40 MG tablet Take 1 tablet (40 mg total) by mouth daily. 30 tablet 10 04/04/2014 at Unknown time  . magnesium oxide (MAG-OX) 400 MG tablet Take 400 mg by mouth 2 (two) times daily.     04/04/2014 at Unknown time  . metFORMIN (GLUCOPHAGE) 500 MG tablet Take 500 mg by mouth 2 (two) times daily with a meal.    04/04/2014 at Unknown time  . Omega-3 Fatty Acids (FISH OIL BURP-LESS) 1000 MG CAPS Take 1,200 mg by mouth 2 (two) times daily.    04/04/2014 at  Unknown time  . pravastatin (PRAVACHOL) 40 MG tablet Take 40 mg by mouth daily.   04/04/2014 at Unknown time  . Tamsulosin HCl (FLOMAX) 0.4 MG CAPS Take 0.4 mg by mouth daily after breakfast.   04/04/2014 at Unknown time  . warfarin (COUMADIN) 4 MG tablet Take 2-4 mg by mouth See admin instructions. Takes 2mg  for two days, then 4mg  (1 tablet) one day.   04/04/2014 at 1000  . ondansetron (ZOFRAN) 4 MG tablet Take 1 tablet (4 mg total) by mouth every 6 (six) hours as needed for nausea. (Patient not taking: Reported on 04/05/2014) 4 tablet 0 Taking  . ondansetron (ZOFRAN) 4 MG tablet Take 1 tablet (4 mg total) by mouth every 6 (six) hours as needed for nausea. (Patient not taking: Reported on 04/05/2014) 12 tablet 0 Taking  . ONE TOUCH ULTRA TEST test strip    Taking  . oxyCODONE-acetaminophen (PERCOCET/ROXICET) 5-325 MG per tablet Take 1 tablet by mouth every 4 (four) hours as needed for severe pain. (Patient not taking: Reported on 04/05/2014) 20 tablet 0 Taking  . oxyCODONE-acetaminophen (PERCOCET/ROXICET)  5-325 MG per tablet Take 1 tablet by mouth every 4 (four) hours as needed. (Patient not taking: Reported on 04/05/2014) 6 tablet 0 Not Taking  . oxyCODONE-acetaminophen (PERCOCET/ROXICET) 5-325 MG per tablet Take 1 tablet by mouth every 4 (four) hours as needed. (Patient not taking: Reported on 04/05/2014) 20 tablet 0 Taking  . tamsulosin (FLOMAX) 0.4 MG CAPS capsule Take 1 capsule (0.4 mg total) by mouth daily. (Patient not taking: Reported on 04/05/2014) 10 capsule 0 Not Taking    Assessment: Okay for Protocol, INR sub-therapeutic on admission.  79 y/o ? diag Afib 02/15/11 CHad2Vasc2 score= 4.  Resume coumadin since no indicatoin for surgery or for actvie bleeding per Cards.   Goal of Therapy:  INR 2-3   Plan:  Warfarin 5mg  PO x 1. Daily PT/INR.  Margo Aye, Quandarius Nill A 04/07/2014,9:01 AM

## 2014-04-07 NOTE — Progress Notes (Signed)
Patient was complaining of difficulty breathing, oxygen saturations were 100% on room air. MD was notified and maintenance fluids were changed from 129ml/hr to 80ml/hr. Patient was also placed on 2L of oxygen via nasal cannula for comfort measures. Patient is currently stable and will continue to monitor.  Starla Link, RN

## 2014-04-10 LAB — CULTURE, BLOOD (ROUTINE X 2): CULTURE: NO GROWTH

## 2014-04-11 LAB — CULTURE, BLOOD (ROUTINE X 2)

## 2014-04-20 ENCOUNTER — Telehealth: Payer: Self-pay | Admitting: Cardiovascular Disease

## 2014-04-20 DIAGNOSIS — I255 Ischemic cardiomyopathy: Secondary | ICD-10-CM

## 2014-04-20 NOTE — Telephone Encounter (Signed)
Pt was recently hospitalized 04/05/14 for viral illness, Dr. Tenny Craw was cardiology consult for in-hospital. Pt discharged 12/31.  Dr. Janna Arch had put in request for Cardiac Rehab to f/u on this patient. They called today asking if we had seen an order or seen the patient. He is 2 weeks out from hospital discharge, has not been in office. Do we need to see this patient? If so I can schedule for 1st available or mid-level provider. If not, does he need Cardiac Rehab?

## 2014-04-20 NOTE — Telephone Encounter (Signed)
Graciella Belton is calling to see if [name] wants Mr. Harries to have Cardiac Rehab because they have not gotten the referral yet . Please call [phone] and ask for Dianne .Marland Kitchen Thanks

## 2014-04-20 NOTE — Telephone Encounter (Signed)
Ok for cardiac rehab and ist available appointment with mid-level until I can see him

## 2014-04-21 NOTE — Telephone Encounter (Signed)
Spoke to patient, he preferred to see [name] instead of mlp. Scheduled him, told to call for any acute concerns.  Put in amb ref for cardiac rehab.    Pt voiced understanding.

## 2014-04-26 ENCOUNTER — Encounter (HOSPITAL_COMMUNITY)
Admission: RE | Admit: 2014-04-26 | Discharge: 2014-04-26 | Disposition: A | Discharge status: Home / Self Care | Payer: Self-pay | Source: Ambulatory Visit | Attending: Cardiovascular Disease | Admitting: Cardiovascular Disease

## 2014-05-02 ENCOUNTER — Encounter (HOSPITAL_COMMUNITY): Payer: Self-pay

## 2014-05-04 ENCOUNTER — Encounter (HOSPITAL_COMMUNITY): Admission: RE | Admit: 2014-05-04 | Payer: Self-pay | Source: Ambulatory Visit

## 2014-05-06 ENCOUNTER — Encounter (HOSPITAL_COMMUNITY): Payer: Self-pay

## 2014-05-09 ENCOUNTER — Encounter (HOSPITAL_COMMUNITY): Payer: Self-pay

## 2014-05-11 ENCOUNTER — Encounter (HOSPITAL_COMMUNITY): Payer: Self-pay

## 2014-05-13 ENCOUNTER — Encounter (HOSPITAL_COMMUNITY): Payer: Self-pay

## 2014-05-16 ENCOUNTER — Encounter (HOSPITAL_COMMUNITY): Payer: Self-pay

## 2014-05-18 ENCOUNTER — Encounter (HOSPITAL_COMMUNITY): Payer: Self-pay

## 2014-05-20 ENCOUNTER — Encounter (HOSPITAL_COMMUNITY): Payer: Self-pay

## 2014-05-20 ENCOUNTER — Telehealth: Payer: Self-pay | Admitting: *Deleted

## 2014-05-20 NOTE — Telephone Encounter (Signed)
Returned cardiac rehab prescription signed by [facility].

## 2014-05-23 ENCOUNTER — Encounter (HOSPITAL_COMMUNITY): Payer: Self-pay

## 2014-05-25 ENCOUNTER — Encounter (HOSPITAL_COMMUNITY): Payer: Self-pay

## 2014-05-26 ENCOUNTER — Telehealth: Payer: Self-pay | Admitting: Cardiovascular Disease

## 2014-05-26 ENCOUNTER — Encounter: Payer: Self-pay | Admitting: *Deleted

## 2014-05-27 ENCOUNTER — Encounter (HOSPITAL_COMMUNITY): Payer: Self-pay

## 2014-05-27 ENCOUNTER — Encounter: Payer: Self-pay | Admitting: Cardiovascular Disease

## 2014-05-27 ENCOUNTER — Ambulatory Visit (INDEPENDENT_AMBULATORY_CARE_PROVIDER_SITE_OTHER): Payer: Medicare HMO | Admitting: Cardiovascular Disease

## 2014-05-27 VITALS — BP 112/72 | HR 72 | Ht 64.0 in | Wt 176.8 lb

## 2014-05-27 DIAGNOSIS — Z95810 Presence of automatic (implantable) cardiac defibrillator: Secondary | ICD-10-CM

## 2014-05-27 DIAGNOSIS — I255 Ischemic cardiomyopathy: Secondary | ICD-10-CM

## 2014-05-27 DIAGNOSIS — I482 Chronic atrial fibrillation: Secondary | ICD-10-CM

## 2014-05-27 DIAGNOSIS — G4733 Obstructive sleep apnea (adult) (pediatric): Secondary | ICD-10-CM

## 2014-05-27 DIAGNOSIS — I4821 Permanent atrial fibrillation: Secondary | ICD-10-CM

## 2014-05-27 NOTE — Patient Instructions (Addendum)
Your physician wants you to follow-up in: 9 months or sooner with [name]. You will receive a reminder letter in the mail two months in advance. If you don't receive a letter, please call our office to schedule the follow-up appointment.

## 2014-05-28 ENCOUNTER — Encounter: Payer: Self-pay | Admitting: Cardiovascular Disease

## 2014-05-28 NOTE — Progress Notes (Signed)
Patient ID: [name], male   DOB: 1934-08-15, 80 y.o.   MRN: [id number] Patient ID: [name], male   DOB: 04/13/1934, 80 y.o.   MRN: [id number]     HPI: [name] is a 80 y.o. male who presents to the office for a 60-month cardiology evaluation.  [name] has a history of an ischemic cardiomyopathy. In April 1998 he suffered a large anterior wall myocardial infarction and underwent intervention to the LAD. In April 1999 a stent was placed to the LAD and he had PTCA of his circumflex vessel. In November 2004 he underwent stenting of his RCA.  I had not seen him for a 7 year period but ultimately he presented to me in January 2014. Prior to that, he had been hospitalized after a fall and developed rib fractures and a mild subarachnoid hemorrhage. He also was found to be in permanent atrial fibrillation and had been on Coumadin therapy. When I saw him in February 2014 and nuclear study showed extensive scar entire LAD territory as well as a large portion of the RCA territory with minimal borderline ischemia. Ejection fraction was less than 20% on echo Doppler study in April 2014 consequently I recommended he undergo an ICD implantation. This was done by Dr.Croitoru in May 2014.  Additional problems include hypertension, peripheral edema, Coumadin anticoagulation, and hyperlipidemia. He also has obesity. He has a history of obstructive sleep apnea and is on CPAP therapy.  He does admit to frequent having ecchymoses in his right upper arm. He has purposely lost weight from a maximum weight of 245 to now 176.    He was hospitalized in [facility] in late December 2015  When he was admitted with hypotension, leukocytosis, and cough.  He was treated with vancomycin and Zosyn empirically for possible septicemia.  He had minimal elevation of his troponins.  He was not felt to have an MI.  He underwent a follow-up echo Doppler study at that time on 04/05/2014.  Ejection fraction was 30%.  There was  akinesis of the distal lateral, distal anterior, distal inferior, mid, distal, inferoseptal, and apical walls with diffuse hypokinesis elsewhere.  There was trivial AR, mild MR , and mild left atrial dilatation.   Presently, [name] feels well.  He denies any recurrent weakness. He denies any recent episodes of chest pressure.  He remains relatively active. He denies PND, orthopnea. He is unaware of any defibrillator discharge.  He denies presyncope or syncope.  He saw Dr. Royann Shivers on 11/25/2013 for ICD evaluation.  Interrogation of his device showed normal function.  He has permanent atrial fibrillation and is on warfarin anticoagulation.  He presents for follow-up evaluation    Past Medical History  Diagnosis Date  . Arteriosclerotic cardiovascular disease (ASCVD)   . COPD (chronic obstructive pulmonary disease)   . Gout   . DJD (degenerative joint disease)   . Hyperlipidemia   . Chronic anticoagulation 2012    2012  . CHF (congestive heart failure)   . Atrial fibrillation     Onset in 2012  . Atrial flutter   . ICD (implantable cardiac defibrillator) in place   . Myocardial infarction 1998  . Ischemic cardiomyopathy   . Obstructive sleep apnea     "went away when I lost a bunch of weight" (08/17/2012)  . Type II diabetes mellitus   . Kidney stone     "just once" (08/17/2012)  . NICM (nonischemic cardiomyopathy), EF 25-30% 08/18/2012  .  At risk for sudden cardiac death 2012/09/09  . Permanent atrial fibrillation 02/15/2011    Initial onset in 03/2011 with rapid ventricular response   . S/P ICD (internal cardiac defibrillator) procedure, 08/17/12, [employer] 09-09-12    [employer]    Past Surgical History  Procedure Laterality Date  . Cholecystectomy  2009  . Knee arthroplasty Left 1978    "tendon & cartilege repair" (08/17/2012)  . Vasectomy  ~ 1964  . Cystoscopy/retrograde/ureteroscopy  06/28/2011    Procedure: CYSTOSCOPY/RETROGRADE/URETEROSCOPY;  Surgeon:  Marissa Nestle, MD;  Location: [facility];  Service: Urology;  Laterality: Right;  . Stone extraction with basket  06/28/2011    Procedure: STONE EXTRACTION WITH BASKET;  Surgeon: Marissa Nestle, MD;  Location: [facility];  Service: Urology;  Laterality: Right;  specimen given to family per MD  . Cardiac defibrillator placement  08/17/2012    [employer]  . Coronary angioplasty with stent placement  07/14/1996    "1" (08/17/2012)  . Cataract extraction w/ intraocular lens  implant, bilateral Bilateral ~ 2011  . US echocardiography  07/08/2012    EF <20%,mild MR,TR,LA severely dilated  . Myoview perfusion scan  06/02/2012    low risk, extensive scar entire LAD & RCA territory  . S/p icd  08/2012    [employer]  . Implantable cardioverter defibrillator implant N/A 08/17/2012    Procedure: IMPLANTABLE CARDIOVERTER DEFIBRILLATOR IMPLANT;  Surgeon: Sanda Klein, MD;  Location: [facility];  Service: Cardiovascular;  Laterality: N/A;    Allergies  Allergen Reactions  . Lipitor [Atorvastatin] Other (See Comments)    myalgias   . Procaine Hcl Nausea And Vomiting  . Tramadol Nausea And Vomiting    Current Outpatient Prescriptions  Medication Sig Dispense Refill  . carvedilol (COREG) 12.5 MG tablet Take 12.5 mg by mouth 2 (two) times daily with a meal.    . digoxin (LANOXIN) 0.25 MG tablet Take 1 tablet (0.25 mg total) by mouth daily. 30 tablet 3  . furosemide (LASIX) 40 MG tablet Take 1 tablet (40 mg total) by mouth daily. 30 tablet 10  . magnesium oxide (MAG-OX) 400 MG tablet Take 400 mg by mouth 2 (two) times daily.      . metFORMIN (GLUCOPHAGE) 500 MG tablet Take 500 mg by mouth 2 (two) times daily with a meal.     . Omega-3 Fatty Acids (FISH OIL BURP-LESS) 1000 MG CAPS Take 1,200 mg by mouth 2 (two) times daily.     . ONE TOUCH ULTRA TEST test strip     . pravastatin (PRAVACHOL) 40 MG tablet Take 40 mg by mouth daily.    . tamsulosin (FLOMAX) 0.4 MG CAPS capsule Take 1 capsule (0.4 mg total)  by mouth daily. 10 capsule 0  . warfarin (COUMADIN) 4 MG tablet Day1- $RemoveBefo'4mg'mybBKfSICaw$ , Day2- $Remov'2mg'uueZts$ , Day3- $Remov'2mg'gEKVFu$  Then repeat     No current facility-administered medications for this visit.    History   Social History  . Marital Status: Married    Spouse Name: N/A  . Number of Children: N/A  . Years of Education: N/A   Occupational History  . Not on file.   Social History Main Topics  . Smoking status: Former Smoker -- 2.00 packs/day for 40 years    Types: Cigarettes    Quit date: 04/08/1992  . Smokeless tobacco: Former Systems developer  . Alcohol Use: No     Comment: 08/17/2012 "quit drinking in 1983"  . Drug Use: No  . Sexual Activity: No  Other Topics Concern  . Not on file   Social History Narrative    Family History  Problem Relation Age of Onset  . Heart failure Mother   . Heart failure Father     ROS General: Negative; No fevers, chills, or night sweats; purposeful weight loss from 245 pounds to 175 pounds. HEENT: Negative; No changes in vision or hearing, sinus congestion, difficulty swallowing Pulmonary: Negative; No cough, wheezing, shortness of breath, hemoptysis Cardiovascular: Negative; No chest pain, presyncope, syncope, palpitations GI: Negative; No nausea, vomiting, diarrhea, or abdominal pain GU: Positive for kidney stone; No dysuria, hematuria, or difficulty voiding Musculoskeletal: Negative; no myalgias, joint pain, or weakness Hematologic/Oncology: Negative; no easy bruising, bleeding Endocrine: Positive for diabetes mellitus; no heat/cold intolerance; no diabetes Neuro: Negative; no changes in balance, headaches Skin: Negative; No rashes or skin lesions Psychiatric: Negative; No behavioral problems, depression Sleep: Positive for sleep apnea on CPAP therapy.; No snoring, daytime sleepiness, hypersomnolence, bruxism, restless legs, hypnogognic hallucinations, no cataplexy Other comprehensive 14 point system review is negative.   PE BP 112/72 mmHg  Pulse 72  Ht $R'5\' 4"'ZD$   (1.626 m)  Wt 176 lb 12.8 oz (80.196 kg)  BMI 30.33 kg/m2  General: Alert, oriented, no distress.  Skin: normal turgor, no rashes; area of ecchymosis in the right upper arm HEENT: Normocephalic, atraumatic. Pupils round and reactive; sclera anicteric;no lid lag.  Nose without nasal septal hypertrophy Mouth/Parynx benign; Mallinpatti scale 3; mild arterial narrowing without hemorrhages or exudate Neck: No JVD, no carotid bruits with normal carotid upstroke Lungs: clear to ausculatation and percussion; no wheezing or rales Chest wall: no tenderness to palpitation Heart: RRR, s1 s2 normal 1/6 systolic murmur; no S3 gallop.  No diastolic murmur, rubs, thrills or heaves. Abdomen: soft, nontender; no hepatosplenomehaly, BS+; abdominal aorta nontender and not dilated by palpation. Back: no CVA tenderness Pulses 2+ Extremities:  Trace edema in his lower extremity,  no clubbing cyanosis, Homan's sign negative  Neurologic: grossly nonfocal Psychologic: normal affect and mood.  ECG (independently read by me): Atrial fibrillation at 72 bpm. Nonspecific IVCD. Mild T wave abdomen.  These inferiorly.  September 2015ECG (independently read by me): Permanent atrial fibrillation with a ventricular rate of 78 beats per minute.  Nonspecific ST-T changes.  Appropriate pacing.  Prior ECG: Underlying atrial fibrillation with appropriate Ventricular pacing and sensing.  LABS:  BMET  BMP Latest Ref Rng 04/07/2014 04/05/2014 12/15/2013  Glucose 70 - 99 mg/dL 176(H) 219(H) 249(H)  BUN 6 - 23 mg/dL $Remove'8 16 15  'uzYLuHx$ Creatinine 0.50 - 1.35 mg/dL 0.82 1.08 1.13  Sodium 135 - 145 mmol/L 134(L) 133(L) 135(L)  Potassium 3.5 - 5.1 mmol/L 3.4(L) 4.1 4.7  Chloride 96 - 112 mEq/L 101 97 95(L)  CO2 19 - 32 mmol/L $RemoveB'24 25 27  'vMlmIadC$ Calcium 8.4 - 10.5 mg/dL 7.9(L) 8.8 9.5     Hepatic Function Panel   Hepatic Function Latest Ref Rng 04/05/2014 03/26/2012 03/25/2012  Total Protein 6.0 - 8.3 g/dL 6.4 6.7 6.2  Albumin 3.5 - 5.2 g/dL  3.7 3.8 3.7  AST 0 - 37 U/L 41(H) 19 19  ALT 0 - 53 U/L $Remo'27 14 13  'xEvsU$ Alk Phosphatase 39 - 117 U/L 39 76 70  Total Bilirubin 0.3 - 1.2 mg/dL 1.6(H) 0.6 0.8  Bilirubin, Direct 0.0 - 0.3 mg/dL - 0.2 0.2    CBC  CBC Latest Ref Rng 04/06/2014 04/05/2014 12/15/2013  WBC 4.0 - 10.5 K/uL 8.7 10.4 11.6(H)  Hemoglobin 13.0 - 17.0 g/dL 13.7 15.3  17.0  Hematocrit 39.0 - 52.0 % 40.1 44.2 47.2  Platelets 150 - 400 K/uL 95(L) 105(L) 134(L)   BNP    Component Value Date/Time   PROBNP 3742.0* 03/25/2012 0513    Lipid Panel     Component Value Date/Time   CHOL 117 02/15/2011 0824   TRIG 155* 02/15/2011 0824   HDL 38* 02/15/2011 0824   CHOLHDL 3.1 02/15/2011 0824   VLDL 31 02/15/2011 0824   LDLCALC 48 02/15/2011 0824     RADIOLOGY: No results found.   ASSESSMENT AND PLAN: [name] is a very pleasant 80 year old gentleman who has a history of an ischemic cardiomyopathy who sohffered his initial anterior wall myocardial infarction in 1998. He has subsequently developed permanent atrial fibrillation and  underwent ICD implantation by Dr. Sallyanne Kuster with a single chamber ICD for primary prevention per Maditt II criteria .  He now is permanent atrial fibrillation.  Ventricular rate is well-controlled on his current dose of carvedilol 12.5 mg twice a day.  He also has been taking digoxin 0.25 mg and I have suggested he reduce this to 0.125 mg daily. He has mixed hyperlipidemia and is on omega-3 fatty acids in addition to pravastatin 40 mg.  Dr. Cindie Laroche has checked laboratory and I will try to obtain results concerning his lipid studies which may have been recently done.  He is on Coumadin anticoagulation.  There is no current bleeding.  I reviewed his hospitalization records from [facility].  His echo Doppler study was reviewed and his ejection fraction is 30%.  He denies any awareness of defibrillator discharge. Presently, there is no signs of CHF. His blood pressure is well controlled.  Presently,  he is not on any ACE inhibitor or ARB therapy but this may be worthwhile to reinitiate if he is able to tolerate this.  He continues to use CPAP for his obstructive sleep apnea. He is unaware of any breakthrough snoring. He denies residual daytime sleepiness. He will be seeing Dr. Sallyanne Kuster  for follow-up of his defibrillator in the summer.  I will see him 3 months later for further evaluation.  Time spent: 25 minutes  Troy Sine, MD, [facility]  05/28/2014 11:11 AM

## 2014-05-30 ENCOUNTER — Encounter (HOSPITAL_COMMUNITY): Payer: Self-pay

## 2014-05-30 NOTE — Telephone Encounter (Signed)
Closed encounter °

## 2014-06-01 ENCOUNTER — Encounter (HOSPITAL_COMMUNITY): Payer: Self-pay

## 2014-06-03 ENCOUNTER — Encounter (HOSPITAL_COMMUNITY): Payer: Self-pay

## 2014-06-06 ENCOUNTER — Encounter (HOSPITAL_COMMUNITY): Payer: Self-pay

## 2014-06-08 ENCOUNTER — Encounter (HOSPITAL_COMMUNITY): Payer: Self-pay

## 2014-06-10 ENCOUNTER — Encounter (HOSPITAL_COMMUNITY): Payer: Self-pay

## 2014-06-13 ENCOUNTER — Encounter (HOSPITAL_COMMUNITY): Payer: Self-pay

## 2014-06-15 ENCOUNTER — Encounter (HOSPITAL_COMMUNITY): Payer: Self-pay

## 2014-06-17 ENCOUNTER — Encounter (HOSPITAL_COMMUNITY): Payer: Self-pay

## 2014-06-20 ENCOUNTER — Encounter (HOSPITAL_COMMUNITY): Payer: Self-pay

## 2014-06-22 ENCOUNTER — Encounter (HOSPITAL_COMMUNITY): Payer: Self-pay

## 2014-06-24 ENCOUNTER — Encounter (HOSPITAL_COMMUNITY): Payer: Self-pay

## 2014-07-06 ENCOUNTER — Ambulatory Visit (INDEPENDENT_AMBULATORY_CARE_PROVIDER_SITE_OTHER): Payer: Medicare HMO | Admitting: *Deleted

## 2014-07-06 DIAGNOSIS — I255 Ischemic cardiomyopathy: Secondary | ICD-10-CM

## 2014-07-07 LAB — MDC_IDC_ENUM_SESS_TYPE_REMOTE
HIGH POWER IMPEDANCE MEASURED VALUE: 76 Ohm
Lead Channel Impedance Value: 481 Ohm
Lead Channel Setting Pacing Amplitude: 2.5 V
Lead Channel Setting Pacing Pulse Width: 0.4 ms
MDC IDC MSMT LEADCHNL RV SENSING INTR AMPL: 17.4 mV
MDC IDC PG SERIAL: 106496
MDC IDC SET LEADCHNL RV SENSING SENSITIVITY: 0.4 mV
MDC IDC STAT BRADY RV PERCENT PACED: 9 %
Zone Setting Detection Interval: 300 ms

## 2014-07-07 NOTE — Progress Notes (Signed)
Remote ICD transmission.   

## 2014-07-08 ENCOUNTER — Encounter: Payer: Self-pay | Admitting: Cardiology

## 2014-07-12 ENCOUNTER — Encounter: Payer: Self-pay | Admitting: Cardiovascular Disease

## 2014-10-06 ENCOUNTER — Emergency Department (HOSPITAL_COMMUNITY)
Admission: EM | Admit: 2014-10-06 | Discharge: 2014-10-06 | Disposition: A | Discharge status: Home / Self Care | Payer: Medicare HMO | Attending: Emergency Medicine | Admitting: Emergency Medicine

## 2014-10-06 ENCOUNTER — Emergency Department (HOSPITAL_COMMUNITY): Payer: Medicare HMO

## 2014-10-06 ENCOUNTER — Encounter: Payer: Self-pay | Admitting: Cardiovascular Disease

## 2014-10-06 ENCOUNTER — Telehealth: Payer: Self-pay | Admitting: Cardiology

## 2014-10-06 ENCOUNTER — Ambulatory Visit (INDEPENDENT_AMBULATORY_CARE_PROVIDER_SITE_OTHER): Payer: Medicare HMO | Admitting: *Deleted

## 2014-10-06 ENCOUNTER — Encounter (HOSPITAL_COMMUNITY): Payer: Self-pay | Admitting: Emergency Medicine

## 2014-10-06 DIAGNOSIS — I252 Old myocardial infarction: Secondary | ICD-10-CM | POA: Insufficient documentation

## 2014-10-06 DIAGNOSIS — Y9389 Activity, other specified: Secondary | ICD-10-CM | POA: Diagnosis not present

## 2014-10-06 DIAGNOSIS — Z87891 Personal history of nicotine dependence: Secondary | ICD-10-CM | POA: Insufficient documentation

## 2014-10-06 DIAGNOSIS — Z8739 Personal history of other diseases of the musculoskeletal system and connective tissue: Secondary | ICD-10-CM | POA: Insufficient documentation

## 2014-10-06 DIAGNOSIS — E119 Type 2 diabetes mellitus without complications: Secondary | ICD-10-CM | POA: Insufficient documentation

## 2014-10-06 DIAGNOSIS — I609 Nontraumatic subarachnoid hemorrhage, unspecified: Secondary | ICD-10-CM

## 2014-10-06 DIAGNOSIS — W1839XA Other fall on same level, initial encounter: Secondary | ICD-10-CM | POA: Insufficient documentation

## 2014-10-06 DIAGNOSIS — J449 Chronic obstructive pulmonary disease, unspecified: Secondary | ICD-10-CM | POA: Diagnosis not present

## 2014-10-06 DIAGNOSIS — I255 Ischemic cardiomyopathy: Secondary | ICD-10-CM

## 2014-10-06 DIAGNOSIS — S0990XA Unspecified injury of head, initial encounter: Secondary | ICD-10-CM | POA: Diagnosis not present

## 2014-10-06 DIAGNOSIS — Z95810 Presence of automatic (implantable) cardiac defibrillator: Secondary | ICD-10-CM | POA: Insufficient documentation

## 2014-10-06 DIAGNOSIS — E785 Hyperlipidemia, unspecified: Secondary | ICD-10-CM | POA: Diagnosis not present

## 2014-10-06 DIAGNOSIS — Z8669 Personal history of other diseases of the nervous system and sense organs: Secondary | ICD-10-CM | POA: Diagnosis not present

## 2014-10-06 DIAGNOSIS — Z7901 Long term (current) use of anticoagulants: Secondary | ICD-10-CM | POA: Insufficient documentation

## 2014-10-06 DIAGNOSIS — Z79899 Other long term (current) drug therapy: Secondary | ICD-10-CM | POA: Diagnosis not present

## 2014-10-06 DIAGNOSIS — I4891 Unspecified atrial fibrillation: Secondary | ICD-10-CM | POA: Insufficient documentation

## 2014-10-06 DIAGNOSIS — Y92007 Garden or yard of unspecified non-institutional (private) residence as the place of occurrence of the external cause: Secondary | ICD-10-CM | POA: Insufficient documentation

## 2014-10-06 DIAGNOSIS — Z87442 Personal history of urinary calculi: Secondary | ICD-10-CM | POA: Insufficient documentation

## 2014-10-06 DIAGNOSIS — Y998 Other external cause status: Secondary | ICD-10-CM | POA: Insufficient documentation

## 2014-10-06 DIAGNOSIS — R55 Syncope and collapse: Secondary | ICD-10-CM | POA: Diagnosis not present

## 2014-10-06 DIAGNOSIS — S2232XA Fracture of one rib, left side, initial encounter for closed fracture: Secondary | ICD-10-CM | POA: Insufficient documentation

## 2014-10-06 DIAGNOSIS — W19XXXA Unspecified fall, initial encounter: Secondary | ICD-10-CM

## 2014-10-06 DIAGNOSIS — S24109A Unspecified injury at unspecified level of thoracic spinal cord, initial encounter: Secondary | ICD-10-CM | POA: Diagnosis present

## 2014-10-06 HISTORY — DX: Nontraumatic subarachnoid hemorrhage, unspecified: I60.9

## 2014-10-06 LAB — CBC WITH DIFFERENTIAL/PLATELET
BASOS ABS: 0 10*3/uL (ref 0.0–0.1)
Basophils Relative: 0 % (ref 0–1)
EOS ABS: 0.1 10*3/uL (ref 0.0–0.7)
Eosinophils Relative: 2 % (ref 0–5)
HEMATOCRIT: 44 % (ref 39.0–52.0)
Hemoglobin: 15.7 g/dL (ref 13.0–17.0)
LYMPHS PCT: 30 % (ref 12–46)
Lymphs Abs: 2.2 10*3/uL (ref 0.7–4.0)
MCH: 34.1 pg — AB (ref 26.0–34.0)
MCHC: 35.7 g/dL (ref 30.0–36.0)
MCV: 95.7 fL (ref 78.0–100.0)
MONO ABS: 0.6 10*3/uL (ref 0.1–1.0)
MONOS PCT: 8 % (ref 3–12)
NEUTROS PCT: 60 % (ref 43–77)
Neutro Abs: 4.6 10*3/uL (ref 1.7–7.7)
Platelets: 135 10*3/uL — ABNORMAL LOW (ref 150–400)
RBC: 4.6 MIL/uL (ref 4.22–5.81)
RDW: 13.4 % (ref 11.5–15.5)
WBC: 7.6 10*3/uL (ref 4.0–10.5)

## 2014-10-06 LAB — BASIC METABOLIC PANEL
Anion gap: 11 (ref 5–15)
BUN: 14 mg/dL (ref 6–20)
CALCIUM: 8.7 mg/dL — AB (ref 8.9–10.3)
CHLORIDE: 96 mmol/L — AB (ref 101–111)
CO2: 30 mmol/L (ref 22–32)
Creatinine, Ser: 1.14 mg/dL (ref 0.61–1.24)
GFR calc non Af Amer: 60 mL/min — ABNORMAL LOW (ref >60)
GLUCOSE: 277 mg/dL — AB (ref 65–99)
POTASSIUM: 3.3 mmol/L — AB (ref 3.5–5.1)
Sodium: 137 mmol/L (ref 135–145)

## 2014-10-06 LAB — TROPONIN I: Troponin I: <0.03 ng/mL (ref <0.031)

## 2014-10-06 MED ORDER — HYDROCODONE-ACETAMINOPHEN 5-325 MG PO TABS: 1.0000 | ORAL_TABLET | ORAL | Status: DC | PRN

## 2014-10-06 MED ORDER — ONDANSETRON HCL 4 MG/2ML IJ SOLN
4.0000 mg | Freq: Once | INTRAMUSCULAR | Status: AC
  Administered 2014-10-06: 4 mg via INTRAVENOUS
  Filled 2014-10-06: qty 2

## 2014-10-06 MED ORDER — MORPHINE SULFATE 4 MG/ML IJ SOLN
4.0000 mg | Freq: Once | INTRAMUSCULAR | Status: AC
  Administered 2014-10-06: 4 mg via INTRAVENOUS
  Filled 2014-10-06: qty 1

## 2014-10-06 MED ORDER — HYDROCODONE-ACETAMINOPHEN 5-325 MG PO TABS: 2.0000 | ORAL_TABLET | ORAL | Status: DC | PRN

## 2014-10-06 NOTE — Progress Notes (Signed)
Remote ICD transmission.   

## 2014-10-06 NOTE — ED Provider Notes (Signed)
CSN: [id number]     Arrival date & time 10/06/14  2040 History  This chart was scribed for [name] Crease, MD by [name], ED Scribe. This patient was seen in room APA18/[facility]18 and the patient's care was started at 8:49 PM.  Chief Complaint  Patient presents with  . Loss of Consciousness   The history is provided by the patient and the spouse. No language interpreter was used.     HPI Comments: [name] is a 80 y.o. male brought in by ambulance, who presents to the Emergency Department complaining of upper thoracic back pain s/p ground level fall that occurred earlier today. Pt mentions that he was sitting in a chair outside in his yard, when he fell backwards landing onto his back. He denies LOC. Pt states that he began having a gradual headache shortly afterwards but states it has since resolved. Denies weakness, or numbness, or any other symptoms.   Past Medical History  Diagnosis Date  . Arteriosclerotic cardiovascular disease (ASCVD)   . COPD (chronic obstructive pulmonary disease)   . Gout   . DJD (degenerative joint disease)   . Hyperlipidemia   . Chronic anticoagulation 2012    2012  . CHF (congestive heart failure)   . Atrial fibrillation     Onset in 2012  . Atrial flutter   . ICD (implantable cardiac defibrillator) in place   . Myocardial infarction 1998  . Ischemic cardiomyopathy   . Obstructive sleep apnea     "went away when I lost a bunch of weight" (08/17/2012)  . Type II diabetes mellitus   . Kidney stone     "just once" (08/17/2012)  . NICM (nonischemic cardiomyopathy), EF 25-30% 2012/09/09  . At risk for sudden cardiac death 09-09-12  . Permanent atrial fibrillation 02/15/2011    Initial onset in 03/2011 with rapid ventricular response   . S/P ICD (internal cardiac defibrillator) procedure, 08/17/12, [employer] 09-Sep-2012    [employer]   Past Surgical History  Procedure Laterality Date  . Cholecystectomy  2009  . Knee arthroplasty  Left 1978    "tendon & cartilege repair" (08/17/2012)  . Vasectomy  ~ 1964  . Cystoscopy/retrograde/ureteroscopy  06/28/2011    Procedure: CYSTOSCOPY/RETROGRADE/URETEROSCOPY;  Surgeon: Ky Barban, MD;  Location: [facility];  Service: Urology;  Laterality: Right;  . Stone extraction with basket  06/28/2011    Procedure: STONE EXTRACTION WITH BASKET;  Surgeon: Ky Barban, MD;  Location: [facility];  Service: Urology;  Laterality: Right;  specimen given to family per MD  . Cardiac defibrillator placement  08/17/2012    [employer]  . Coronary angioplasty with stent placement  07/14/1996    "1" (08/17/2012)  . Cataract extraction w/ intraocular lens  implant, bilateral Bilateral ~ 2011  . US echocardiography  07/08/2012    EF <20%,mild MR,TR,LA severely dilated  . Myoview perfusion scan  06/02/2012    low risk, extensive scar entire LAD & RCA territory  . S/p icd  08/2012    [employer]  . Implantable cardioverter defibrillator implant N/A 08/17/2012    Procedure: IMPLANTABLE CARDIOVERTER DEFIBRILLATOR IMPLANT;  Surgeon: Thurmon Fair, MD;  Location: [facility];  Service: Cardiovascular;  Laterality: N/A;   Family History  Problem Relation Age of Onset  . Heart failure Mother   . Heart failure Father    History  Substance Use Topics  . Smoking status: Former Smoker -- 2.00 packs/day for 40 years    Types: Cigarettes  Quit date: 04/08/1992  . Smokeless tobacco: Former Neurosurgeon  . Alcohol Use: No     Comment: 08/17/2012 "quit drinking in 1983"    Review of Systems  Musculoskeletal: Positive for back pain.  Neurological: Positive for headaches. Negative for syncope, weakness and numbness.  All other systems reviewed and are negative.   Allergies  Lipitor; Procaine hcl; and Tramadol  Home Medications   Prior to Admission medications   Medication Sig Start Date End Date Taking? Authorizing Provider  carvedilol (COREG) 12.5 MG tablet Take 12.5 mg by mouth 2 (two) times daily with a  meal.   Yes Historical Provider, MD  digoxin (LANOXIN) 0.25 MG tablet Take 1 tablet (0.25 mg total) by mouth daily. 02/28/12 01/26/15 Yes Freeman Caldron, PA-C  furosemide (LASIX) 40 MG tablet Take 1 tablet (40 mg total) by mouth daily. 07/12/13  Yes Lennette Bihari, MD  magnesium oxide (MAG-OX) 400 MG tablet Take 400 mg by mouth 2 (two) times daily.     Yes Historical Provider, MD  metFORMIN (GLUCOPHAGE) 500 MG tablet Take 500 mg by mouth 2 (two) times daily with a meal.    Yes Historical Provider, MD  Omega-3 Fatty Acids (FISH OIL BURP-LESS) 1000 MG CAPS Take 1,200 mg by mouth 2 (two) times daily.    Yes Historical Provider, MD  pravastatin (PRAVACHOL) 40 MG tablet Take 40 mg by mouth daily.   Yes Historical Provider, MD  tamsulosin (FLOMAX) 0.4 MG CAPS capsule Take 1 capsule (0.4 mg total) by mouth daily. Patient not taking: Reported on 10/06/2014 12/15/13   Dione Booze, MD  warfarin (COUMADIN) 4 MG tablet Take 2-4 mg by mouth daily. Day1- 4mg , Day2- 2mg , Day3- 2mg  Then repeat 05/24/14   Historical Provider, MD   Triage Vitals: BP 142/63 mmHg  Pulse 86  Temp(Src) 98 F (36.7 C) (Oral)  Resp 20  Ht 5\' 4"  (1.626 m)  Wt 165 lb (74.844 kg)  BMI 28.31 kg/m2  SpO2 97%   Physical Exam  Constitutional: He is oriented to person, place, and time. He appears well-developed and well-nourished. No distress.  HENT:  Head: Normocephalic and atraumatic.  Right Ear: Hearing normal.  Left Ear: Hearing normal.  Nose: Nose normal.  Mouth/Throat: Oropharynx is clear and moist and mucous membranes are normal.  Eyes: Conjunctivae and EOM are normal. Pupils are equal, round, and reactive to light.  Neck: Normal range of motion. Neck supple.  Cardiovascular: Normal rate, S1 normal and S2 normal.  An irregular rhythm present. Exam reveals no gallop and no friction rub.   No murmur heard. Pulmonary/Chest: Effort normal and breath sounds normal. No respiratory distress. He exhibits no tenderness.  Abdominal:  Soft. Normal appearance and bowel sounds are normal. There is no hepatosplenomegaly. There is no tenderness. There is no rebound, no guarding, no tenderness at McBurney's point and negative Murphy's sign. No hernia.  Musculoskeletal: Normal range of motion. He exhibits tenderness.  Diffuse upper thoracic paraspinal tenderness.   Neurological: He is alert and oriented to person, place, and time. He has normal strength. No cranial nerve deficit or sensory deficit. Coordination normal. GCS eye subscore is 4. GCS verbal subscore is 5. GCS motor subscore is 6.  Skin: Skin is warm, dry and intact. No rash noted. No cyanosis.  Psychiatric: He has a normal mood and affect. His speech is normal and behavior is normal. Thought content normal.  Nursing note and vitals reviewed.   ED Course  Procedures (including critical care time)  DIAGNOSTIC  STUDIES: Oxygen Saturation is 97% on RA, normal by my interpretation.    COORDINATION OF CARE: 8:54 PM-Discussed treatment plan which includes CXR, CT Head, CT C Spine, DG T Spine, CBC, BMP, Troponin with pt at bedside and pt agreed to plan.   Labs Review Labs Reviewed  CBC WITH DIFFERENTIAL/PLATELET - Abnormal; Notable for the following:    MCH 34.1 (*)    Platelets 135 (*)    All other components within normal limits  BASIC METABOLIC PANEL - Abnormal; Notable for the following:    Potassium 3.3 (*)    Chloride 96 (*)    Glucose, Bld 277 (*)    Calcium 8.7 (*)    GFR calc non Af Amer 60 (*)    All other components within normal limits  TROPONIN I    Imaging Review Dg Chest 2 View  10/06/2014   CLINICAL DATA:  Mid upper back pain after falling out of a chair in the lawn this evening.  EXAM: CHEST  2 VIEW  COMPARISON:  04/05/2014  FINDINGS: There is mild unchanged cardiomegaly. The transvenous lead appears intact. There is no pneumothorax. Mediastinal contours are normal and unchanged. The lungs are clear. There is no effusion. There is an acute  minimally displaced fracture at the lateral aspect of the left fifth rib. Portions of the left second through sixth ribs are obscured by the cardiac pacing device.  IMPRESSION: Minimally displaced fracture at the lateral aspect of the left fifth rib.   Electronically Signed   By: Ellery Plunk M.D.   On: 10/06/2014 21:44   Dg Thoracic Spine 2 View  10/06/2014   CLINICAL DATA:  Mid back pain following falling from chair, initial encounter  EXAM: THORACIC SPINE - 2-3 VIEWS  COMPARISON:  04/05/2014  FINDINGS: Multilevel osteophytic changes are seen. No compression deformities are noted. No paraspinal mass lesion is seen. The visualized rib cage is within normal limits. A pacing device is again seen and stable.  IMPRESSION: No acute abnormality noted.  Multilevel degenerative change is seen.   Electronically Signed   By: Alcide Clever M.D.   On: 10/06/2014 21:47   Ct Head Wo Contrast  10/06/2014   CLINICAL DATA:  Posttraumatic headache after falling out of chair in yard today. No loss of consciousness.  EXAM: CT HEAD WITHOUT CONTRAST  CT CERVICAL SPINE WITHOUT CONTRAST  TECHNIQUE: Multidetector CT imaging of the head and cervical spine was performed following the standard protocol without intravenous contrast. Multiplanar CT image reconstructions of the cervical spine were also generated.  COMPARISON:  CT scan of head of August 02, 2013.  FINDINGS: CT HEAD FINDINGS  Bony calvarium appears intact. Mild diffuse cortical atrophy is noted. No mass effect or midline shift is noted. Ventricular size is within normal limits. There is no evidence of mass lesion, hemorrhage or acute infarction.  CT CERVICAL SPINE FINDINGS  No fracture or spondylolisthesis is noted. Mild degenerative disc disease is noted at C6-7. Posterior facet joints appear normal.  IMPRESSION: Mild diffuse cortical atrophy. No acute intracranial abnormality seen.  Mild degenerative disc disease is noted at C6-7. No acute abnormality seen in cervical  spine.   Electronically Signed   By: Lupita Raider, M.D.   On: 10/06/2014 21:36   Ct Cervical Spine Wo Contrast  10/06/2014   CLINICAL DATA:  Posttraumatic headache after falling out of chair in yard today. No loss of consciousness.  EXAM: CT HEAD WITHOUT CONTRAST  CT CERVICAL SPINE WITHOUT CONTRAST  TECHNIQUE: Multidetector CT imaging of the head and cervical spine was performed following the standard protocol without intravenous contrast. Multiplanar CT image reconstructions of the cervical spine were also generated.  COMPARISON:  CT scan of head of August 02, 2013.  FINDINGS: CT HEAD FINDINGS  Bony calvarium appears intact. Mild diffuse cortical atrophy is noted. No mass effect or midline shift is noted. Ventricular size is within normal limits. There is no evidence of mass lesion, hemorrhage or acute infarction.  CT CERVICAL SPINE FINDINGS  No fracture or spondylolisthesis is noted. Mild degenerative disc disease is noted at C6-7. Posterior facet joints appear normal.  IMPRESSION: Mild diffuse cortical atrophy. No acute intracranial abnormality seen.  Mild degenerative disc disease is noted at C6-7. No acute abnormality seen in cervical spine.   Electronically Signed   By: Lupita Raider, M.D.   On: 10/06/2014 21:36     EKG Interpretation   Date/Time:  Thursday October 06 2014 20:45:08 EDT Ventricular Rate:  78 PR Interval:    QRS Duration: 146 QT Interval:  354 QTC Calculation: 403 R Axis:   2 Text Interpretation:  Atrial fibrillation Left bundle branch block No  significant change since last tracing Confirmed by POLLINA  MD,  CHRISTOPHER [phone]) on 10/06/2014 8:52:35 PM      MDM   Final diagnoses:  Fall  Fall  Fall   rib fracture  Patient presents to the ER for evaluation of a fall. Patient reports that he sat in a chair that was on uneven ground and fell backwards. Patient complaining of pain in his upper back between the shoulder blades and headache. He has a normal neurologic  examination. CT head and cervical spine were unremarkable. Thoracic spine x-ray did not show any acute abnormality. Patient does have a minimally displaced fracture of the fifth rib. This likely explains his pain. Patient will be discharged, treated with analgesia.  I personally performed the services described in this documentation, which was scribed in my presence. The recorded information has been reviewed and is accurate.       [name] Crease, MD 10/06/14 2239

## 2014-10-06 NOTE — Telephone Encounter (Signed)
Attempted to confirm remote transmission with pt. No answer and was unable to leave a message.   

## 2014-10-06 NOTE — ED Notes (Signed)
Per EMS patient fell while sitting in a plastic chair. Patient states pain across mid back and shoulder, pain in front of head. EMS reports Afib on monitor.

## 2014-10-06 NOTE — Discharge Instructions (Signed)
Rib Fracture °A rib fracture is a break or crack in one of the bones of the ribs. The ribs are like a cage that goes around your upper chest. A broken or cracked rib is often painful, but most do not cause other problems. Most rib fractures heal on their own in 1-3 months. °HOME CARE °· Avoid activities that cause pain to the injured area. Protect your injured area. °· Slowly increase activity as told by your doctor. °· Take medicine as told by your doctor. °· Put ice on the injured area for the first 1-2 days after you have been treated or as told by your doctor. °¨ Put ice in a plastic bag. °¨ Place a towel between your skin and the bag. °¨ Leave the ice on for 15-20 minutes at a time, every 2 hours while you are awake. °· Do deep breathing as told by your doctor. You may be told to: °¨ Take deep breaths many times a day. °¨ Cough many times a day while hugging a pillow. °¨ Use a device (incentive spirometer) to perform deep breathing many times a day. °· Drink enough fluids to keep your pee (urine) clear or pale yellow.   °· Do not wear a rib belt or binder. These do not allow you to breathe deeply. °GET HELP RIGHT AWAY IF:  °· You have a fever. °· You have trouble breathing.   °· You cannot stop coughing. °· You cough up thick or bloody spit (mucus).   °· You feel sick to your stomach (nauseous), throw up (vomit), or have belly (abdominal) pain.   °· Your pain gets worse and medicine does not help.   °MAKE SURE YOU:  °· Understand these instructions. °· Will watch your condition. °· Will get help right away if you are not doing well or get worse. °Document Released: 01/02/2008 Document Revised: 07/20/2012 Document Reviewed: 05/27/2012 °ExitCare® Patient Information ©2015 [employer]. This information is not intended to replace advice given to you by your health care provider. Make sure you discuss any questions you have with your health care provider. ° °

## 2014-10-08 ENCOUNTER — Emergency Department (HOSPITAL_COMMUNITY): Payer: Medicare HMO

## 2014-10-08 ENCOUNTER — Emergency Department (HOSPITAL_COMMUNITY)
Admission: EM | Admit: 2014-10-08 | Discharge: 2014-10-08 | Disposition: A | Discharge status: Home / Self Care | Payer: Medicare HMO | Attending: Emergency Medicine | Admitting: Emergency Medicine

## 2014-10-08 ENCOUNTER — Encounter (HOSPITAL_COMMUNITY): Payer: Self-pay | Admitting: *Deleted

## 2014-10-08 DIAGNOSIS — I499 Cardiac arrhythmia, unspecified: Secondary | ICD-10-CM | POA: Diagnosis not present

## 2014-10-08 DIAGNOSIS — J449 Chronic obstructive pulmonary disease, unspecified: Secondary | ICD-10-CM | POA: Insufficient documentation

## 2014-10-08 DIAGNOSIS — Z79899 Other long term (current) drug therapy: Secondary | ICD-10-CM | POA: Diagnosis not present

## 2014-10-08 DIAGNOSIS — I509 Heart failure, unspecified: Secondary | ICD-10-CM | POA: Insufficient documentation

## 2014-10-08 DIAGNOSIS — Z95810 Presence of automatic (implantable) cardiac defibrillator: Secondary | ICD-10-CM | POA: Insufficient documentation

## 2014-10-08 DIAGNOSIS — I252 Old myocardial infarction: Secondary | ICD-10-CM | POA: Insufficient documentation

## 2014-10-08 DIAGNOSIS — I4892 Unspecified atrial flutter: Secondary | ICD-10-CM | POA: Insufficient documentation

## 2014-10-08 DIAGNOSIS — R51 Headache: Secondary | ICD-10-CM | POA: Diagnosis present

## 2014-10-08 DIAGNOSIS — I482 Chronic atrial fibrillation: Secondary | ICD-10-CM | POA: Insufficient documentation

## 2014-10-08 DIAGNOSIS — I4891 Unspecified atrial fibrillation: Secondary | ICD-10-CM | POA: Insufficient documentation

## 2014-10-08 DIAGNOSIS — F0781 Postconcussional syndrome: Secondary | ICD-10-CM | POA: Diagnosis not present

## 2014-10-08 DIAGNOSIS — Z8669 Personal history of other diseases of the nervous system and sense organs: Secondary | ICD-10-CM | POA: Insufficient documentation

## 2014-10-08 DIAGNOSIS — Z9861 Coronary angioplasty status: Secondary | ICD-10-CM | POA: Insufficient documentation

## 2014-10-08 DIAGNOSIS — Z87891 Personal history of nicotine dependence: Secondary | ICD-10-CM | POA: Diagnosis not present

## 2014-10-08 DIAGNOSIS — E119 Type 2 diabetes mellitus without complications: Secondary | ICD-10-CM | POA: Insufficient documentation

## 2014-10-08 DIAGNOSIS — Z87442 Personal history of urinary calculi: Secondary | ICD-10-CM | POA: Diagnosis not present

## 2014-10-08 DIAGNOSIS — I2510 Atherosclerotic heart disease of native coronary artery without angina pectoris: Secondary | ICD-10-CM | POA: Insufficient documentation

## 2014-10-08 DIAGNOSIS — Z8739 Personal history of other diseases of the musculoskeletal system and connective tissue: Secondary | ICD-10-CM | POA: Diagnosis not present

## 2014-10-08 DIAGNOSIS — H9319 Tinnitus, unspecified ear: Secondary | ICD-10-CM | POA: Diagnosis not present

## 2014-10-08 DIAGNOSIS — E785 Hyperlipidemia, unspecified: Secondary | ICD-10-CM | POA: Insufficient documentation

## 2014-10-08 DIAGNOSIS — Z7901 Long term (current) use of anticoagulants: Secondary | ICD-10-CM | POA: Insufficient documentation

## 2014-10-08 LAB — URINALYSIS, ROUTINE W REFLEX MICROSCOPIC
GLUCOSE, UA: NEGATIVE mg/dL
Ketones, ur: NEGATIVE mg/dL
LEUKOCYTES UA: NEGATIVE
Nitrite: NEGATIVE
PH: 5 (ref 5.0–8.0)
Protein, ur: NEGATIVE mg/dL
SPECIFIC GRAVITY, URINE: 1.017 (ref 1.005–1.030)
Urobilinogen, UA: 1 mg/dL (ref 0.0–1.0)

## 2014-10-08 LAB — I-STAT CHEM 8, ED
BUN: 15 mg/dL (ref 6–20)
CREATININE: 1.2 mg/dL (ref 0.61–1.24)
Calcium, Ion: 1.01 mmol/L — ABNORMAL LOW (ref 1.13–1.30)
Chloride: 93 mmol/L — ABNORMAL LOW (ref 101–111)
GLUCOSE: 113 mg/dL — AB (ref 65–99)
HCT: 44 % (ref 39.0–52.0)
Hemoglobin: 15 g/dL (ref 13.0–17.0)
POTASSIUM: 3.3 mmol/L — AB (ref 3.5–5.1)
SODIUM: 137 mmol/L (ref 135–145)
TCO2: 28 mmol/L (ref 0–100)

## 2014-10-08 LAB — URINE MICROSCOPIC-ADD ON

## 2014-10-08 LAB — I-STAT TROPONIN, ED: Troponin i, poc: 0.01 ng/mL (ref 0.00–0.08)

## 2014-10-08 MED ORDER — DEXAMETHASONE SODIUM PHOSPHATE 10 MG/ML IJ SOLN
10.0000 mg | Freq: Once | INTRAMUSCULAR | Status: AC
  Administered 2014-10-08: 10 mg via INTRAMUSCULAR
  Filled 2014-10-08: qty 1

## 2014-10-08 MED ORDER — KETOROLAC TROMETHAMINE 60 MG/2ML IM SOLN: 60.0000 mg | Freq: Once | INTRAMUSCULAR | Status: DC

## 2014-10-08 MED ORDER — POTASSIUM CHLORIDE CRYS ER 20 MEQ PO TBCR
40.0000 meq | EXTENDED_RELEASE_TABLET | Freq: Once | ORAL | Status: AC
  Administered 2014-10-08: 40 meq via ORAL
  Filled 2014-10-08: qty 2

## 2014-10-08 NOTE — Discharge Instructions (Signed)
Concussion °Direct trauma to the head often causes a condition known as a concussion. This injury can temporarily interfere with brain function and may cause you to pass out (lose consciousness). The consequences of a concussion are usually short-term, but repetitive concussions can be very dangerous. If you have multiple concussions, you will have a greater risk of long-term effects, such as slurred speech, slow movements, impaired thinking, or tremors. The severity of a concussion is based on the length and severity of the interference with brain activity. °SYMPTOMS  °Symptoms of a concussion vary depending on the severity of the injury. Very mild concussions may even occur without any noticeable symptoms. Swelling in the area of the injury is not related to the seriousness of the injury.  °· Mild concussion: °¨ Temporary loss of consciousness may or may not occur. °¨ Memory loss (amnesia) for a short time. °¨ Emotional instability. °¨ Confusion. °· Severe concussion: °¨ Usually prolonged loss of consciousness. °¨ Confusion °¨ One pupil (the black part in the middle of the eye) is larger than the other. °¨ Changes in vision (including blurring). °¨ Changes in breathing. °¨ Disturbed balance (equilibrium). °¨ Headaches. °¨ Confusion. °¨ Nausea or vomiting. °¨ Slower reaction time than normal. °¨ Difficulty learning and remembering things you have heard. °CAUSES  °A concussion is the result of trauma to the head. When the head is subjected to such an injury, the brain strikes against the inner wall of the skull. This impact is what causes the damage to the brain. The force of injury is related to severity of injury. The most severe concussions are associated with incidents that involve large impact forces such as motor vehicle accidents. Wearing a helmet will reduce the severity of trauma to the head, but concussions may still occur if you are wearing a helmet. °RISK INCREASES WITH: °· Contact sports (football,  hockey, soccer, rugby, basketball or lacrosse). °· Fighting sports (martial arts or boxing). °· Riding bicycles, motorcycles, or horses (when you ride without a helmet). °PREVENTION °· Wear proper protective headgear and ensure correct fit. °· Wear seat belts when driving and riding in a car. °· Do not drink or use mind-altering drugs and drive. °PROGNOSIS  °Concussions are typically curable if they are recognized and treated early. If a severe concussion or multiple concussions go untreated, then the complications may be life-threatening or cause permanent disability and brain damage. °RELATED COMPLICATIONS  °· Permanent brain damage (slurred speech, slow movement, impaired thinking, or tremors). °· Bleeding under the skull (subdural hemorrhage or hematoma, epidural hematoma). °· Bleeding into the brain. °· Prolonged healing time if usual activities are resumed too soon. °· Infection if skin over the concussion site is broken. °· Increased risk of future concussions (less trauma is required for a second concussion than the first). °TREATMENT  °Treatment initially requires immediate evaluation to determine the severity of the concussion. Occasionally, a hospital stay may be required for observation and treatment.  °Avoid exertion. Bed rest for the first 24-48 hours is recommended.  °Return to play is a controversial subject due to the increased risk for future injury as well as permanent disability and should be discussed at length with your treating caregiver. Many factors such as the severity of the concussion and whether this is the first, second, or third concussion play a role in timing a patient's return to sports.  °MEDICATION  °Do not give any medicine, including non-prescription acetaminophen or aspirin, until the diagnosis is certain. These medicines may mask developing   symptoms.  °SEEK IMMEDIATE MEDICAL CARE IF:  °· Symptoms get worse or do not improve in 24 hours. °· Any of the following symptoms  occur: °¨ Vomiting. °¨ The inability to move arms and legs equally well on both sides. °¨ Fever. °¨ Neck stiffness. °¨ Pupils of unequal size, shape, or reactivity. °¨ Convulsions. °¨ Noticeable restlessness. °¨ Severe headache that persists for longer than 4 hours after injury. °¨ Confusion, disorientation, or mental status changes. °Document Released: 03/25/2005 Document Revised: 01/13/2013 Document Reviewed: 07/07/2008 °ExitCare® Patient Information ©2015 [employer]. This information is not intended to replace advice given to you by your health care provider. Make sure you discuss any questions you have with your health care provider. ° °

## 2014-10-08 NOTE — ED Notes (Signed)
The pt fell June 30th striking his head he was seen at [facility] but they brought him here for a second opinion because his head still hurts.  He points to the back of his head  Where he is hurting.  The family is reporting that he is not acting like himself

## 2014-10-08 NOTE — ED Provider Notes (Signed)
CSN: [id number]     Arrival date & time 10/08/14  0150 History  This chart was scribed for [name] Samons, MD by Evon Slack, ED Scribe. This patient was seen in room A09C/A09C and the patient's care was started at 2:03 AM.    Chief Complaint  Patient presents with  . Headache    Patient is a 80 y.o. male presenting with headaches. The history is provided by the patient. No language interpreter was used.  Headache Pain location:  Occipital Quality:  Dull Radiates to:  Does not radiate Onset quality:  Gradual Duration:  2 days Timing:  Constant Progression:  Unchanged Chronicity:  New Relieved by:  Nothing Associated symptoms: no dizziness, no fever, no near-syncope, no neck pain, no neck stiffness, no numbness, no seizures and no weakness   Risk factors: no anger    HPI Comments: [name] is a 80 y.o. male with PMHx listed below who presents to the Emergency Department complaining of left sided posterior HA onset today.  Pt states that he is hearing a "train" and static in his left ear. Pt states that the pain medication prescribed is not providing any relief. Pt report fall 2 days prior. Pt states that he fractured left rib.   Past Medical History  Diagnosis Date  . Arteriosclerotic cardiovascular disease (ASCVD)   . COPD (chronic obstructive pulmonary disease)   . Gout   . DJD (degenerative joint disease)   . Hyperlipidemia   . Chronic anticoagulation 2012    2012  . CHF (congestive heart failure)   . Atrial fibrillation     Onset in 2012  . Atrial flutter   . ICD (implantable cardiac defibrillator) in place   . Myocardial infarction 1998  . Ischemic cardiomyopathy   . Obstructive sleep apnea     "went away when I lost a bunch of weight" (08/17/2012)  . Type II diabetes mellitus   . Kidney stone     "just once" (08/17/2012)  . NICM (nonischemic cardiomyopathy), EF 25-30% 08-25-2012  . At risk for sudden cardiac death 08/25/12  . Permanent atrial fibrillation  02/15/2011    Initial onset in 03/2011 with rapid ventricular response   . S/P ICD (internal cardiac defibrillator) procedure, 08/17/12, [employer] Aug 25, 2012    [employer]   Past Surgical History  Procedure Laterality Date  . Cholecystectomy  2009  . Knee arthroplasty Left 1978    "tendon & cartilege repair" (08/17/2012)  . Vasectomy  ~ 1964  . Cystoscopy/retrograde/ureteroscopy  06/28/2011    Procedure: CYSTOSCOPY/RETROGRADE/URETEROSCOPY;  Surgeon: Ky Barban, MD;  Location: [facility];  Service: Urology;  Laterality: Right;  . Stone extraction with basket  06/28/2011    Procedure: STONE EXTRACTION WITH BASKET;  Surgeon: Ky Barban, MD;  Location: [facility];  Service: Urology;  Laterality: Right;  specimen given to family per MD  . Cardiac defibrillator placement  08/17/2012    [employer]  . Coronary angioplasty with stent placement  07/14/1996    "1" (08/17/2012)  . Cataract extraction w/ intraocular lens  implant, bilateral Bilateral ~ 2011  . US echocardiography  07/08/2012    EF <20%,mild MR,TR,LA severely dilated  . Myoview perfusion scan  06/02/2012    low risk, extensive scar entire LAD & RCA territory  . S/p icd  08/2012    [employer]  . Implantable cardioverter defibrillator implant N/A 08/17/2012    Procedure: IMPLANTABLE CARDIOVERTER DEFIBRILLATOR IMPLANT;  Surgeon: Thurmon Fair, MD;  Location: [facility];  Service: Cardiovascular;  Laterality: N/A;   Family History  Problem Relation Age of Onset  . Heart failure Mother   . Heart failure Father    History  Substance Use Topics  . Smoking status: Former Smoker -- 2.00 packs/day for 40 years    Types: Cigarettes    Quit date: 04/08/1992  . Smokeless tobacco: Former Neurosurgeon  . Alcohol Use: No     Comment: 08/17/2012 "quit drinking in 1983"    Review of Systems  Constitutional: Negative for fever.  Respiratory: Negative for shortness of breath.   Cardiovascular: Negative for chest pain, leg swelling  and near-syncope.  Musculoskeletal: Negative for neck pain and neck stiffness.  Neurological: Positive for headaches. Negative for dizziness, tremors, seizures, syncope, facial asymmetry, speech difficulty, weakness, light-headedness and numbness.  All other systems reviewed and are negative.    Allergies  Lipitor; Procaine hcl; and Tramadol  Home Medications   Prior to Admission medications   Medication Sig Start Date End Date Taking? Authorizing Provider  carvedilol (COREG) 12.5 MG tablet Take 12.5 mg by mouth 2 (two) times daily with a meal.   Yes Historical Provider, MD  digoxin (LANOXIN) 0.25 MG tablet Take 1 tablet (0.25 mg total) by mouth daily. 02/28/12 01/26/15 Yes Freeman Caldron, PA-C  furosemide (LASIX) 40 MG tablet Take 1 tablet (40 mg total) by mouth daily. 07/12/13  Yes Lennette Bihari, MD  magnesium oxide (MAG-OX) 400 MG tablet Take 400 mg by mouth 2 (two) times daily.     Yes Historical Provider, MD  metFORMIN (GLUCOPHAGE) 500 MG tablet Take 500 mg by mouth 2 (two) times daily with a meal.    Yes Historical Provider, MD  Omega-3 Fatty Acids (FISH OIL BURP-LESS) 1000 MG CAPS Take 1,200 mg by mouth 2 (two) times daily.    Yes Historical Provider, MD  pravastatin (PRAVACHOL) 40 MG tablet Take 40 mg by mouth daily.   Yes Historical Provider, MD  warfarin (COUMADIN) 4 MG tablet Take 2-4 mg by mouth daily. Day1- 4mg , Day2- 2mg , Day3- 2mg  Then repeat 05/24/14  Yes Historical Provider, MD  HYDROcodone-acetaminophen (NORCO/VICODIN) 5-325 MG per tablet Take 2 tablets by mouth every 4 (four) hours as needed for moderate pain. 10/06/14   [name], MD  HYDROcodone-acetaminophen (NORCO/VICODIN) 5-325 MG per tablet Take 1-2 tablets by mouth every 4 (four) hours as needed. Patient not taking: Reported on 10/08/2014 10/06/14   [name], MD  tamsulosin (FLOMAX) 0.4 MG CAPS capsule Take 1 capsule (0.4 mg total) by mouth daily. Patient not taking: Reported on 10/06/2014  12/15/13   Dione Booze, MD   BP 122/58 mmHg  Pulse 83  Resp 16  SpO2 92%   Physical Exam  Constitutional: He is oriented to person, place, and time. He appears well-developed and well-nourished. No distress.  HENT:  Head: Normocephalic and atraumatic.  Right Ear: External ear normal.  Left Ear: External ear normal.  Mouth/Throat: Oropharynx is clear and moist.  Scarring and retraction of right TM. Scarring of left TM.   Eyes: Conjunctivae and EOM are normal.  Neck: Normal range of motion. Neck supple. No tracheal deviation present.  Cardiovascular: Normal rate.  An irregularly irregular rhythm present.  Pulmonary/Chest: Effort normal and breath sounds normal. No respiratory distress. He has no wheezes. He has no rales.  Abdominal: Soft. Bowel sounds are increased. There is no tenderness. There is no rebound and no guarding.  Musculoskeletal: Normal range of motion.  Lymphadenopathy:    He  has no cervical adenopathy.  Neurological: He is alert and oriented to person, place, and time. He has normal reflexes. He displays normal reflexes. No cranial nerve deficit. He exhibits normal muscle tone. Coordination normal.  Skin: Skin is warm and dry.  Psychiatric: He has a normal mood and affect. His behavior is normal.  Nursing note and vitals reviewed.   ED Course  Procedures (including critical care time) DIAGNOSTIC STUDIES: Oxygen Saturation is 92% on RA, low by my interpretation.    COORDINATION OF CARE: 2:11 AM-Discussed treatment plan with pt at bedside and pt agreed to plan.     Labs Review Labs Reviewed  I-STAT CHEM 8, ED - Abnormal; Notable for the following:    Potassium 3.3 (*)    Chloride 93 (*)    Glucose, Bld 113 (*)    Calcium, Ion 1.01 (*)    All other components within normal limits  CBC WITH DIFFERENTIAL/PLATELET  PROTIME-INR  URINALYSIS, ROUTINE W REFLEX MICROSCOPIC (NOT AT [facility])  I-STAT TROPOININ, ED    Imaging Review Dg Chest 2 View  10/06/2014    CLINICAL DATA:  Mid upper back pain after falling out of a chair in the lawn this evening.  EXAM: CHEST  2 VIEW  COMPARISON:  04/05/2014  FINDINGS: There is mild unchanged cardiomegaly. The transvenous lead appears intact. There is no pneumothorax. Mediastinal contours are normal and unchanged. The lungs are clear. There is no effusion. There is an acute minimally displaced fracture at the lateral aspect of the left fifth rib. Portions of the left second through sixth ribs are obscured by the cardiac pacing device.  IMPRESSION: Minimally displaced fracture at the lateral aspect of the left fifth rib.   Electronically Signed   By: Ellery Plunk M.D.   On: 10/06/2014 21:44   Dg Thoracic Spine 2 View  10/06/2014   CLINICAL DATA:  Mid back pain following falling from chair, initial encounter  EXAM: THORACIC SPINE - 2-3 VIEWS  COMPARISON:  04/05/2014  FINDINGS: Multilevel osteophytic changes are seen. No compression deformities are noted. No paraspinal mass lesion is seen. The visualized rib cage is within normal limits. A pacing device is again seen and stable.  IMPRESSION: No acute abnormality noted.  Multilevel degenerative change is seen.   Electronically Signed   By: Alcide Clever M.D.   On: 10/06/2014 21:47   Ct Head Wo Contrast  10/08/2014   CLINICAL DATA:  Status post fall. Left posterior headache and hearing noises on the left. Initial encounter.  EXAM: CT HEAD WITHOUT CONTRAST  TECHNIQUE: Contiguous axial images were obtained from the base of the skull through the vertex without intravenous contrast.  COMPARISON:  CT of the head performed 10/06/2014  FINDINGS: There is no evidence of acute infarction, mass lesion, or intra- or extra-axial hemorrhage on CT.  Prominence of the ventricles and sulci reflects moderate cortical volume loss. Cerebellar atrophy is noted.  The brainstem and fourth ventricle are within normal limits. The basal ganglia are unremarkable in appearance. The cerebral hemispheres  demonstrate grossly normal gray-white differentiation. No mass effect or midline shift is seen.  There is no evidence of fracture; visualized osseous structures are unremarkable in appearance. The visualized portions of the orbits are within normal limits. The paranasal sinuses and mastoid air cells are well-aerated. No significant soft tissue abnormalities are seen.  IMPRESSION: 1. No evidence of traumatic intracranial injury or fracture. 2. Moderate cortical volume loss noted.   Electronically Signed   By: Roanna Raider  M.D.   On: 10/08/2014 02:51   Ct Head Wo Contrast  10/06/2014   CLINICAL DATA:  Posttraumatic headache after falling out of chair in yard today. No loss of consciousness.  EXAM: CT HEAD WITHOUT CONTRAST  CT CERVICAL SPINE WITHOUT CONTRAST  TECHNIQUE: Multidetector CT imaging of the head and cervical spine was performed following the standard protocol without intravenous contrast. Multiplanar CT image reconstructions of the cervical spine were also generated.  COMPARISON:  CT scan of head of [address]iz[address] Chilson 27, 2015.  FINDINGS: CT HEAD FINDINGS  Bony calvarium appears intact. Mild diffuse cortical atrophy is noted. No mass effect or midline shift is noted. Ventricular size is within normal limits. There is no evidence of mass lesion, hemorrhage or acute infarction.  CT CERVICAL SPINE FINDINGS  No fracture or spondylolisthesis is noted. Mild degenerative disc disease is noted at C6-7. Posterior facet joints appear normal.  IMPRESSION: Mild diffuse cortical atrophy. No acute intracranial abnormality seen.  Mild degenerative disc disease is noted at C6-7. No acute abnormality seen in cervical spine.   Electronically Signed   By: Lupita Raider, M.D.   On: 10/06/2014 21:36   Ct Cervical Spine Wo Contrast  10/06/2014   CLINICAL DATA:  Posttraumatic headache after falling out of chair in yard today. No loss of consciousness.  EXAM: CT HEAD WITHOUT CONTRAST  CT CERVICAL SPINE WITHOUT CONTRAST  TECHNIQUE:  Multidetector CT imaging of the head and cervical spine was performed following the standard protocol without intravenous contrast. Multiplanar CT image reconstructions of the cervical spine were also generated.  COMPARISON:  CT scan of head of [address] Hulse 27, 2015.  FINDINGS: CT HEAD FINDINGS  Bony calvarium appears intact. Mild diffuse cortical atrophy is noted. No mass effect or midline shift is noted. Ventricular size is within normal limits. There is no evidence of mass lesion, hemorrhage or acute infarction.  CT CERVICAL SPINE FINDINGS  No fracture or spondylolisthesis is noted. Mild degenerative disc disease is noted at C6-7. Posterior facet joints appear normal.  IMPRESSION: Mild diffuse cortical atrophy. No acute intracranial abnormality seen.  Mild degenerative disc disease is noted at C6-7. No acute abnormality seen in cervical spine.   Electronically Signed   By: Lupita Raider, M.D.   On: 10/06/2014 21:36     EKG Interpretation None      MDM   Final diagnoses:  None   Case d/w Dr. Thad Ranger of neurology.  Likely post concussive take off narcotics,  Agrees with ENT follow up.     All findings discussed with family.  Follow up referral given alternate tylenol and ibuprofen.  Stop narcotics   I personally performed the services described in this documentation, which was scribed in my presence. The recorded information has been reviewed and is accurate.      Cy Blamer, MD 10/08/14 [phone]

## 2014-10-08 NOTE — ED Notes (Signed)
Pt. Unable to void at the moment. 

## 2014-10-08 NOTE — ED Notes (Signed)
Patient transported to CT 

## 2014-10-14 LAB — CUP PACEART REMOTE DEVICE CHECK
Battery Remaining Longevity: 138 mo
Brady Statistic RV Percent Paced: 13 %
HighPow Impedance: 93 Ohm
Lead Channel Impedance Value: 561 Ohm
Lead Channel Pacing Threshold Amplitude: 0.9 V
Lead Channel Pacing Threshold Pulse Width: 0.4 ms
Lead Channel Setting Pacing Pulse Width: 0.4 ms
Lead Channel Setting Sensing Sensitivity: 0.4 mV
MDC IDC MSMT BATTERY REMAINING PERCENTAGE: 100 %
MDC IDC PG SERIAL: 106496
MDC IDC SESS DTM: 20160630163600
MDC IDC SET LEADCHNL RV PACING AMPLITUDE: 2.5 V
Zone Setting Detection Interval: 300 ms

## 2014-10-16 ENCOUNTER — Observation Stay (HOSPITAL_COMMUNITY): Payer: Medicare HMO

## 2014-10-16 ENCOUNTER — Inpatient Hospital Stay (HOSPITAL_COMMUNITY): Payer: Medicare HMO

## 2014-10-16 ENCOUNTER — Inpatient Hospital Stay (HOSPITAL_COMMUNITY)
Admission: EM | Admit: 2014-10-16 | Discharge: 2014-10-18 | DRG: 069 | Disposition: A | Discharge status: Home / Self Care | Payer: Medicare HMO | Attending: Internal Medicine | Admitting: Internal Medicine

## 2014-10-16 ENCOUNTER — Encounter (HOSPITAL_COMMUNITY): Payer: Self-pay

## 2014-10-16 DIAGNOSIS — I248 Other forms of acute ischemic heart disease: Secondary | ICD-10-CM | POA: Diagnosis present

## 2014-10-16 DIAGNOSIS — I609 Nontraumatic subarachnoid hemorrhage, unspecified: Secondary | ICD-10-CM

## 2014-10-16 DIAGNOSIS — M1990 Unspecified osteoarthritis, unspecified site: Secondary | ICD-10-CM | POA: Diagnosis present

## 2014-10-16 DIAGNOSIS — Z961 Presence of intraocular lens: Secondary | ICD-10-CM | POA: Diagnosis present

## 2014-10-16 DIAGNOSIS — G459 Transient cerebral ischemic attack, unspecified: Secondary | ICD-10-CM | POA: Diagnosis present

## 2014-10-16 DIAGNOSIS — R55 Syncope and collapse: Secondary | ICD-10-CM | POA: Diagnosis not present

## 2014-10-16 DIAGNOSIS — R569 Unspecified convulsions: Secondary | ICD-10-CM

## 2014-10-16 DIAGNOSIS — W07XXXA Fall from chair, initial encounter: Secondary | ICD-10-CM | POA: Diagnosis present

## 2014-10-16 DIAGNOSIS — I10 Essential (primary) hypertension: Secondary | ICD-10-CM | POA: Diagnosis present

## 2014-10-16 DIAGNOSIS — Z888 Allergy status to other drugs, medicaments and biological substances status: Secondary | ICD-10-CM

## 2014-10-16 DIAGNOSIS — J438 Other emphysema: Secondary | ICD-10-CM

## 2014-10-16 DIAGNOSIS — Z955 Presence of coronary angioplasty implant and graft: Secondary | ICD-10-CM

## 2014-10-16 DIAGNOSIS — I5022 Chronic systolic (congestive) heart failure: Secondary | ICD-10-CM | POA: Diagnosis present

## 2014-10-16 DIAGNOSIS — I480 Paroxysmal atrial fibrillation: Secondary | ICD-10-CM | POA: Diagnosis present

## 2014-10-16 DIAGNOSIS — I255 Ischemic cardiomyopathy: Secondary | ICD-10-CM | POA: Diagnosis present

## 2014-10-16 DIAGNOSIS — S065X9A Traumatic subdural hemorrhage with loss of consciousness of unspecified duration, initial encounter: Secondary | ICD-10-CM | POA: Diagnosis present

## 2014-10-16 DIAGNOSIS — Z95810 Presence of automatic (implantable) cardiac defibrillator: Secondary | ICD-10-CM | POA: Diagnosis not present

## 2014-10-16 DIAGNOSIS — R7989 Other specified abnormal findings of blood chemistry: Secondary | ICD-10-CM

## 2014-10-16 DIAGNOSIS — Z9841 Cataract extraction status, right eye: Secondary | ICD-10-CM

## 2014-10-16 DIAGNOSIS — R404 Transient alteration of awareness: Secondary | ICD-10-CM | POA: Diagnosis present

## 2014-10-16 DIAGNOSIS — I429 Cardiomyopathy, unspecified: Secondary | ICD-10-CM | POA: Diagnosis present

## 2014-10-16 DIAGNOSIS — Z87891 Personal history of nicotine dependence: Secondary | ICD-10-CM

## 2014-10-16 DIAGNOSIS — G4733 Obstructive sleep apnea (adult) (pediatric): Secondary | ICD-10-CM | POA: Diagnosis present

## 2014-10-16 DIAGNOSIS — I6200 Nontraumatic subdural hemorrhage, unspecified: Secondary | ICD-10-CM

## 2014-10-16 DIAGNOSIS — I6330 Cerebral infarction due to thrombosis of unspecified cerebral artery: Secondary | ICD-10-CM | POA: Diagnosis not present

## 2014-10-16 DIAGNOSIS — J449 Chronic obstructive pulmonary disease, unspecified: Secondary | ICD-10-CM | POA: Diagnosis present

## 2014-10-16 DIAGNOSIS — R778 Other specified abnormalities of plasma proteins: Secondary | ICD-10-CM | POA: Diagnosis present

## 2014-10-16 DIAGNOSIS — Z96652 Presence of left artificial knee joint: Secondary | ICD-10-CM | POA: Diagnosis present

## 2014-10-16 DIAGNOSIS — E1165 Type 2 diabetes mellitus with hyperglycemia: Secondary | ICD-10-CM | POA: Diagnosis present

## 2014-10-16 DIAGNOSIS — Z9842 Cataract extraction status, left eye: Secondary | ICD-10-CM | POA: Diagnosis not present

## 2014-10-16 DIAGNOSIS — E785 Hyperlipidemia, unspecified: Secondary | ICD-10-CM | POA: Diagnosis present

## 2014-10-16 DIAGNOSIS — I447 Left bundle-branch block, unspecified: Secondary | ICD-10-CM | POA: Diagnosis present

## 2014-10-16 DIAGNOSIS — S065XAA Traumatic subdural hemorrhage with loss of consciousness status unknown, initial encounter: Secondary | ICD-10-CM | POA: Diagnosis present

## 2014-10-16 DIAGNOSIS — I252 Old myocardial infarction: Secondary | ICD-10-CM

## 2014-10-16 DIAGNOSIS — I2510 Atherosclerotic heart disease of native coronary artery without angina pectoris: Secondary | ICD-10-CM | POA: Diagnosis present

## 2014-10-16 DIAGNOSIS — IMO0002 Reserved for concepts with insufficient information to code with codable children: Secondary | ICD-10-CM | POA: Diagnosis present

## 2014-10-16 LAB — CBC WITH DIFFERENTIAL/PLATELET
BASOS PCT: 0 % (ref 0–1)
Basophils Absolute: 0 10*3/uL (ref 0.0–0.1)
Eosinophils Absolute: 0.1 10*3/uL (ref 0.0–0.7)
Eosinophils Relative: 1 % (ref 0–5)
HEMATOCRIT: 43 % (ref 39.0–52.0)
Hemoglobin: 15.1 g/dL (ref 13.0–17.0)
LYMPHS PCT: 16 % (ref 12–46)
Lymphs Abs: 1.8 10*3/uL (ref 0.7–4.0)
MCH: 34.1 pg — ABNORMAL HIGH (ref 26.0–34.0)
MCHC: 35.1 g/dL (ref 30.0–36.0)
MCV: 97.1 fL (ref 78.0–100.0)
Monocytes Absolute: 0.8 10*3/uL (ref 0.1–1.0)
Monocytes Relative: 7 % (ref 3–12)
NEUTROS PCT: 76 % (ref 43–77)
Neutro Abs: 8.6 10*3/uL — ABNORMAL HIGH (ref 1.7–7.7)
Platelets: 143 10*3/uL — ABNORMAL LOW (ref 150–400)
RBC: 4.43 MIL/uL (ref 4.22–5.81)
RDW: 13.7 % (ref 11.5–15.5)
WBC: 11.3 10*3/uL — ABNORMAL HIGH (ref 4.0–10.5)

## 2014-10-16 LAB — LIPID PANEL
Cholesterol: 141 mg/dL (ref 0–200)
HDL: 25 mg/dL — AB (ref >40)
LDL CALC: UNDETERMINED mg/dL (ref 0–99)
Total CHOL/HDL Ratio: 5.6 RATIO
Triglycerides: 532 mg/dL — ABNORMAL HIGH (ref <150)
VLDL: UNDETERMINED mg/dL (ref 0–40)

## 2014-10-16 LAB — BASIC METABOLIC PANEL
Anion gap: 10 (ref 5–15)
BUN: 14 mg/dL (ref 6–20)
CALCIUM: 8.8 mg/dL — AB (ref 8.9–10.3)
CHLORIDE: 99 mmol/L — AB (ref 101–111)
CO2: 28 mmol/L (ref 22–32)
CREATININE: 1.06 mg/dL (ref 0.61–1.24)
GFR calc Af Amer: >60 mL/min (ref >60)
GFR calc non Af Amer: >60 mL/min (ref >60)
GLUCOSE: 193 mg/dL — AB (ref 65–99)
Potassium: 3.7 mmol/L (ref 3.5–5.1)
Sodium: 137 mmol/L (ref 135–145)

## 2014-10-16 LAB — GLUCOSE, CAPILLARY
GLUCOSE-CAPILLARY: 261 mg/dL — AB (ref 65–99)
GLUCOSE-CAPILLARY: 145 mg/dL — AB (ref 65–99)
GLUCOSE-CAPILLARY: 200 mg/dL — AB (ref 65–99)
Glucose-Capillary: 175 mg/dL — ABNORMAL HIGH (ref 65–99)
Glucose-Capillary: 201 mg/dL — ABNORMAL HIGH (ref 65–99)

## 2014-10-16 LAB — PROTIME-INR
INR: 2.61 — ABNORMAL HIGH (ref 0.00–1.49)
PROTHROMBIN TIME: 27.6 s — AB (ref 11.6–15.2)

## 2014-10-16 LAB — MRSA PCR SCREENING: MRSA by PCR: NEGATIVE

## 2014-10-16 LAB — DIGOXIN LEVEL
Digoxin Level: 1.4 ng/mL (ref 0.8–2.0)
Digoxin Level: 1.1 ng/mL (ref 0.8–2.0)

## 2014-10-16 LAB — MAGNESIUM: Magnesium: 1.5 mg/dL — ABNORMAL LOW (ref 1.7–2.4)

## 2014-10-16 LAB — TROPONIN I: TROPONIN I: 0.04 ng/mL — AB (ref <0.031)

## 2014-10-16 MED ORDER — FUROSEMIDE 40 MG PO TABS
40.0000 mg | ORAL_TABLET | Freq: Every day | ORAL | Status: DC
  Administered 2014-10-16 – 2014-10-18 (×3): 40 mg via ORAL
  Filled 2014-10-16 (×3): qty 1

## 2014-10-16 MED ORDER — DIGOXIN 125 MCG PO TABS
0.2500 mg | ORAL_TABLET | Freq: Every day | ORAL | Status: DC
  Administered 2014-10-16 – 2014-10-18 (×2): 0.25 mg via ORAL
  Filled 2014-10-16 (×4): qty 1
  Filled 2014-10-16: qty 2

## 2014-10-16 MED ORDER — ACETAMINOPHEN 650 MG RE SUPP: 650.0000 mg | Freq: Four times a day (QID) | RECTAL | Status: DC | PRN

## 2014-10-16 MED ORDER — PRAVASTATIN SODIUM 40 MG PO TABS
40.0000 mg | ORAL_TABLET | Freq: Every day | ORAL | Status: DC
Start: 2014-10-16 — End: 2014-10-18
  Administered 2014-10-16 – 2014-10-18 (×3): 40 mg via ORAL
  Filled 2014-10-16 (×5): qty 1

## 2014-10-16 MED ORDER — SODIUM CHLORIDE 0.9 % IJ SOLN
3.0000 mL | Freq: Two times a day (BID) | INTRAMUSCULAR | Status: DC
  Administered 2014-10-16 – 2014-10-17 (×4): 3 mL via INTRAVENOUS

## 2014-10-16 MED ORDER — SODIUM CHLORIDE 0.9 % IV SOLN
INTRAVENOUS | Status: AC
  Administered 2014-10-16: 06:00:00 via INTRAVENOUS

## 2014-10-16 MED ORDER — ALBUTEROL SULFATE (2.5 MG/3ML) 0.083% IN NEBU: 2.5000 mg | INHALATION_SOLUTION | Freq: Four times a day (QID) | RESPIRATORY_TRACT | Status: DC | PRN

## 2014-10-16 MED ORDER — METFORMIN HCL 500 MG PO TABS
500.0000 mg | ORAL_TABLET | Freq: Two times a day (BID) | ORAL | Status: DC
  Administered 2014-10-16 – 2014-10-18 (×4): 500 mg via ORAL
  Filled 2014-10-16 (×5): qty 1

## 2014-10-16 MED ORDER — INSULIN ASPART 100 UNIT/ML ~~LOC~~ SOLN
0.0000 [IU] | Freq: Three times a day (TID) | SUBCUTANEOUS | Status: DC
  Administered 2014-10-16: 2 [IU] via SUBCUTANEOUS
  Administered 2014-10-16 – 2014-10-17 (×2): 1 [IU] via SUBCUTANEOUS
  Administered 2014-10-17: 3 [IU] via SUBCUTANEOUS
  Administered 2014-10-17: 2 [IU] via SUBCUTANEOUS

## 2014-10-16 MED ORDER — CARVEDILOL 12.5 MG PO TABS
12.5000 mg | ORAL_TABLET | Freq: Two times a day (BID) | ORAL | Status: DC
  Administered 2014-10-17 – 2014-10-18 (×3): 12.5 mg via ORAL
  Filled 2014-10-16 (×5): qty 1

## 2014-10-16 MED ORDER — VITAMIN K1 10 MG/ML IJ SOLN
5.0000 mg | Freq: Once | INTRAMUSCULAR | Status: AC
  Administered 2014-10-16: 5 mg via SUBCUTANEOUS
  Filled 2014-10-16: qty 1

## 2014-10-16 MED ORDER — MAGNESIUM OXIDE 400 (241.3 MG) MG PO TABS
400.0000 mg | ORAL_TABLET | Freq: Two times a day (BID) | ORAL | Status: DC
  Administered 2014-10-16 – 2014-10-18 (×5): 400 mg via ORAL
  Filled 2014-10-16 (×8): qty 1

## 2014-10-16 MED ORDER — ACETAMINOPHEN 325 MG PO TABS
650.0000 mg | ORAL_TABLET | Freq: Four times a day (QID) | ORAL | Status: DC | PRN
Start: 2014-10-16 — End: 2014-10-18
  Administered 2014-10-16 – 2014-10-18 (×5): 650 mg via ORAL
  Filled 2014-10-16 (×6): qty 2

## 2014-10-16 NOTE — H&P (Addendum)
[name] is an 80 y.o. male.     Chief Complaint: couldn't speak HPI: 80 yo male with hx of CHF (EF =30%), Pafib, Dm2, apparently c/o syncope last Thursday, and then tonight apparently had difficulty with getting words out at 11:30 pm last nite  Lasting about 5-10 mintues.  Difficulty with coordination.  + headache.  Pt denies numbness, tingling, focal weakness, incontinence, fever, chills.  CT brain pending.  Pt will be admitted for ? Partial siezure, ? Tia. , syncope.   Past Medical History  Diagnosis Date  . Arteriosclerotic cardiovascular disease (ASCVD)   . COPD (chronic obstructive pulmonary disease)   . Gout   . DJD (degenerative joint disease)   . Hyperlipidemia   . Chronic anticoagulation 2012    2012  . CHF (congestive heart failure)   . Atrial fibrillation     Onset in 2012  . Atrial flutter   . ICD (implantable cardiac defibrillator) in place   . Myocardial infarction 1998  . Ischemic cardiomyopathy   . Obstructive sleep apnea     "went away when I lost a bunch of weight" (08/17/2012)  . Type II diabetes mellitus   . Kidney stone     "just once" (08/17/2012)  . NICM (nonischemic cardiomyopathy), EF 25-30% 2012/08/24  . At risk for sudden cardiac death Aug 24, 2012  . Permanent atrial fibrillation 02/15/2011    Initial onset in 03/2011 with rapid ventricular response   . S/P ICD (internal cardiac defibrillator) procedure, 08/17/12, [employer] 08-24-12    [employer]    Past Surgical History  Procedure Laterality Date  . Cholecystectomy  2009  . Knee arthroplasty Left 1978    "tendon & cartilege repair" (08/17/2012)  . Vasectomy  ~ 1964  . Cystoscopy/retrograde/ureteroscopy  06/28/2011    Procedure: CYSTOSCOPY/RETROGRADE/URETEROSCOPY;  Surgeon: Marissa Nestle, MD;  Location: [facility];  Service: Urology;  Laterality: Right;  . Stone extraction with basket  06/28/2011    Procedure: STONE EXTRACTION WITH BASKET;  Surgeon: Marissa Nestle, MD;  Location: [facility];  Service: Urology;  Laterality: Right;  specimen given to family per MD  . Cardiac defibrillator placement  08/17/2012    [employer]  . Coronary angioplasty with stent placement  07/14/1996    "1" (08/17/2012)  . Cataract extraction w/ intraocular lens  implant, bilateral Bilateral ~ 2011  . US echocardiography  07/08/2012    EF <20%,mild MR,TR,LA severely dilated  . Myoview perfusion scan  06/02/2012    low risk, extensive scar entire LAD & RCA territory  . S/p icd  08/2012    [employer]  . Implantable cardioverter defibrillator implant N/A 08/17/2012    Procedure: IMPLANTABLE CARDIOVERTER DEFIBRILLATOR IMPLANT;  Surgeon: Sanda Klein, MD;  Location: [place];  Service: Cardiovascular;  Laterality: N/A;  . Cardiac catheterization  02/2003    Family History  Problem Relation Age of Onset  . Heart failure Mother   . Heart failure Father    Social History:  reports that he quit smoking about 22 years ago. His smoking use included Cigarettes. He has a 80 pack-year smoking history. He has quit using smokeless tobacco. He reports that he does not drink alcohol or use illicit drugs.  Allergies:  Allergies  Allergen Reactions  . Lipitor [Atorvastatin] Other (See Comments)    myalgias   . Procaine Hcl Nausea And Vomiting  . Tramadol Nausea And Vomiting  Medications reviewed   Results for orders placed or performed during the hospital encounter  of 10/16/14 (from the past 48 hour(s))  Basic metabolic panel     Status: Abnormal   Collection Time: 10/16/14  1:54 AM  Result Value Ref Range   Sodium 137 135 - 145 mmol/L   Potassium 3.7 3.5 - 5.1 mmol/L   Chloride 99 (L) 101 - 111 mmol/L   CO2 28 22 - 32 mmol/L   Glucose, Bld 193 (H) 65 - 99 mg/dL   BUN 14 6 - 20 mg/dL   Creatinine, Ser 1.06 0.61 - 1.24 mg/dL   Calcium 8.8 (L) 8.9 - 10.3 mg/dL   GFR calc non Af Amer >60 >60 mL/min   GFR calc Af Amer >60 >60 mL/min    Comment: (NOTE) The eGFR has been calculated using the CKD  EPI equation. This calculation has not been validated in all clinical situations. eGFR's persistently <60 mL/min signify possible Chronic Kidney Disease.    Anion gap 10 5 - 15  CBC with Differential/Platelet     Status: Abnormal   Collection Time: 10/16/14  1:54 AM  Result Value Ref Range   WBC 11.3 (H) 4.0 - 10.5 K/uL   RBC 4.43 4.22 - 5.81 MIL/uL   Hemoglobin 15.1 13.0 - 17.0 g/dL   HCT 43.0 39.0 - 52.0 %   MCV 97.1 78.0 - 100.0 fL   MCH 34.1 (H) 26.0 - 34.0 pg   MCHC 35.1 30.0 - 36.0 g/dL   RDW 13.7 11.5 - 15.5 %   Platelets 143 (L) 150 - 400 K/uL   Neutrophils Relative % 76 43 - 77 %   Neutro Abs 8.6 (H) 1.7 - 7.7 K/uL   Lymphocytes Relative 16 12 - 46 %   Lymphs Abs 1.8 0.7 - 4.0 K/uL   Monocytes Relative 7 3 - 12 %   Monocytes Absolute 0.8 0.1 - 1.0 K/uL   Eosinophils Relative 1 0 - 5 %   Eosinophils Absolute 0.1 0.0 - 0.7 K/uL   Basophils Relative 0 0 - 1 %   Basophils Absolute 0.0 0.0 - 0.1 K/uL  Protime-INR     Status: Abnormal   Collection Time: 10/16/14  1:54 AM  Result Value Ref Range   Prothrombin Time 27.6 (H) 11.6 - 15.2 seconds   INR 2.61 (H) 0.00 - 1.49  Digoxin level     Status: None   Collection Time: 10/16/14  1:54 AM  Result Value Ref Range   Digoxin Level 1.4 0.8 - 2.0 ng/mL   No results found.  Review of Systems  Constitutional: Negative.   HENT: Negative.   Eyes: Negative.   Respiratory: Negative.   Cardiovascular: Negative.   Gastrointestinal: Negative.   Genitourinary: Negative.   Musculoskeletal: Negative.   Skin: Negative.   Neurological: Positive for loss of consciousness.  Endo/Heme/Allergies: Negative.   Psychiatric/Behavioral: Negative.     Blood pressure 125/78, pulse 80, temperature 98.2 F (36.8 C), temperature source Oral, resp. rate 17, SpO2 99 %. Physical Exam  Constitutional: He is oriented to person, place, and time. He appears well-developed and well-nourished.  HENT:  Head: Normocephalic and atraumatic.   Mouth/Throat: No oropharyngeal exudate.  Eyes: Conjunctivae and EOM are normal. Pupils are equal, round, and reactive to light. No scleral icterus.  Neck: Normal range of motion. Neck supple. No JVD present. No tracheal deviation present. No thyromegaly present.  Cardiovascular: Normal rate and regular rhythm.  Exam reveals no gallop and no friction rub.   No murmur heard. Respiratory: Effort normal and breath sounds normal. No respiratory  distress. He has no wheezes. He has no rales.  GI: Soft. Bowel sounds are normal. He exhibits no distension. There is no tenderness. There is no rebound and no guarding.  Musculoskeletal: Normal range of motion. He exhibits no edema or tenderness.  Lymphadenopathy:    He has no cervical adenopathy.  Neurological: He is alert and oriented to person, place, and time. He has normal reflexes. He displays normal reflexes. No cranial nerve deficit. He exhibits normal muscle tone. Coordination normal.  Skin: Skin is warm and dry. No rash noted. No erythema. No pallor.  Psychiatric: He has a normal mood and affect. His behavior is normal. Judgment and thought content normal.     Assessment/Plan Syncope Tele CT brain Carotid ultrasound Cardiac echo  Inability to speak ? Seizure like actiivity vs tia Check CT brain  Pafib (CHADS2=5) Cont coumadin pharmacy to dose unless there is bleeding on CT brain Then please hold  Dm2 fsbs ac and qhs, iss  CHF (EF 30%) Stable  DVT prophylaxis: SCD  Breniya Goertzen 10/16/2014, 4:31 AM  ADDENDUM CT BRAIN showed subdural Will hold coumadin ED [place] is calling neurosurgery to request consultation TRANSFER to [facility]

## 2014-10-16 NOTE — ED Provider Notes (Signed)
Received call from radiology concerning CT head He has small SDH Pt admitted to [facility] D/w dr Selena Batten Will call nsgy and he dr Selena Batten will arrange txfer to [facility]   Zadie Rhine, MD 10/16/14 [phone]

## 2014-10-16 NOTE — ED Provider Notes (Signed)
CSN: [id number]     Arrival date & time 10/16/14  0115 History   First MD Initiated Contact with Patient 10/16/14 0145     Chief Complaint  Patient presents with  . Loss of Consciousness     Patient is a 80 y.o. male presenting with syncope. The history is provided by the patient.  Loss of Consciousness Episode history:  Single Most recent episode:  Today Timing:  Constant Progression:  Resolved Chronicity:  Recurrent Witnessed: yes   Relieved by:  None tried Worsened by:  Nothing tried Associated symptoms: headaches   Associated symptoms: no chest pain, no fever, no focal weakness, no shortness of breath and no vomiting   Patient presents from home for episode of LOC/alteration in mental status Per patient/family, he was watching "the race" on TV.  At the end of race, he felt like he could not breathe or move.  Family reports he was awake but was unable to move or breath.  He had some tremors but no obvious seizures. He never fell.  He was then placed on the toilet and apparently his color changed and he was not responding to family.  These episodes lasted several minutes.    He is now at baseline No h/o seizure No falls today  He was seen in ED recently for fall.  He hit his head at that time and continues to have HA in his posterior scalp.  No new HA today and no worsening headache.    He has ICD in place but no shocks are reported Past Medical History  Diagnosis Date  . Arteriosclerotic cardiovascular disease (ASCVD)   . COPD (chronic obstructive pulmonary disease)   . Gout   . DJD (degenerative joint disease)   . Hyperlipidemia   . Chronic anticoagulation 2012    2012  . CHF (congestive heart failure)   . Atrial fibrillation     Onset in 2012  . Atrial flutter   . ICD (implantable cardiac defibrillator) in place   . Myocardial infarction 1998  . Ischemic cardiomyopathy   . Obstructive sleep apnea     "went away when I lost a bunch of weight" (08/17/2012)  . Type  II diabetes mellitus   . Kidney stone     "just once" (08/17/2012)  . NICM (nonischemic cardiomyopathy), EF 25-30% 08-19-2012  . At risk for sudden cardiac death 2012-08-19  . Permanent atrial fibrillation 02/15/2011    Initial onset in 03/2011 with rapid ventricular response   . S/P ICD (internal cardiac defibrillator) procedure, 08/17/12, [employer] 08-19-2012    [employer]   Past Surgical History  Procedure Laterality Date  . Cholecystectomy  2009  . Knee arthroplasty Left 1978    "tendon & cartilege repair" (08/17/2012)  . Vasectomy  ~ 1964  . Cystoscopy/retrograde/ureteroscopy  06/28/2011    Procedure: CYSTOSCOPY/RETROGRADE/URETEROSCOPY;  Surgeon: Ky Barban, MD;  Location: [facility];  Service: Urology;  Laterality: Right;  . Stone extraction with basket  06/28/2011    Procedure: STONE EXTRACTION WITH BASKET;  Surgeon: Ky Barban, MD;  Location: [facility];  Service: Urology;  Laterality: Right;  specimen given to family per MD  . Cardiac defibrillator placement  08/17/2012    [employer]  . Coronary angioplasty with stent placement  07/14/1996    "1" (08/17/2012)  . Cataract extraction w/ intraocular lens  implant, bilateral Bilateral ~ 2011  . US echocardiography  07/08/2012    EF <20%,mild MR,TR,LA severely dilated  . Myoview perfusion scan  06/02/2012    low risk, extensive scar entire LAD & RCA territory  . S/p icd  08/2012    [employer]  . Implantable cardioverter defibrillator implant N/A 08/17/2012    Procedure: IMPLANTABLE CARDIOVERTER DEFIBRILLATOR IMPLANT;  Surgeon: Thurmon Fair, MD;  Location: [facility];  Service: Cardiovascular;  Laterality: N/A;  . Cardiac catheterization  02/2003   Family History  Problem Relation Age of Onset  . Heart failure Mother   . Heart failure Father    History  Substance Use Topics  . Smoking status: Former Smoker -- 2.00 packs/day for 40 years    Types: Cigarettes    Quit date: 04/08/1992  . Smokeless tobacco:  Former Neurosurgeon  . Alcohol Use: No     Comment: 08/17/2012 "quit drinking in 1983"    Review of Systems  Constitutional: Negative for fever.  Respiratory: Negative for shortness of breath.   Cardiovascular: Positive for syncope. Negative for chest pain.  Gastrointestinal: Negative for vomiting.  Neurological: Positive for headaches. Negative for focal weakness.  All other systems reviewed and are negative.     Allergies  Lipitor; Procaine hcl; and Tramadol  Home Medications   Prior to Admission medications   Medication Sig Start Date End Date Taking? Authorizing Provider  carvedilol (COREG) 12.5 MG tablet Take 12.5 mg by mouth 2 (two) times daily with a meal.    Historical Provider, MD  digoxin (LANOXIN) 0.25 MG tablet Take 1 tablet (0.25 mg total) by mouth daily. 02/28/12 01/26/15  Freeman Caldron, PA-C  furosemide (LASIX) 40 MG tablet Take 1 tablet (40 mg total) by mouth daily. 07/12/13   Lennette Bihari, MD  HYDROcodone-acetaminophen (NORCO/VICODIN) 5-325 MG per tablet Take 2 tablets by mouth every 4 (four) hours as needed for moderate pain. 10/06/14   [name], MD  HYDROcodone-acetaminophen (NORCO/VICODIN) 5-325 MG per tablet Take 1-2 tablets by mouth every 4 (four) hours as needed. Patient not taking: Reported on 10/08/2014 10/06/14   [name], MD  magnesium oxide (MAG-OX) 400 MG tablet Take 400 mg by mouth 2 (two) times daily.      Historical Provider, MD  metFORMIN (GLUCOPHAGE) 500 MG tablet Take 500 mg by mouth 2 (two) times daily with a meal.     Historical Provider, MD  Omega-3 Fatty Acids (FISH OIL BURP-LESS) 1000 MG CAPS Take 1,200 mg by mouth 2 (two) times daily.     Historical Provider, MD  pravastatin (PRAVACHOL) 40 MG tablet Take 40 mg by mouth daily.    Historical Provider, MD  tamsulosin (FLOMAX) 0.4 MG CAPS capsule Take 1 capsule (0.4 mg total) by mouth daily. Patient not taking: Reported on 10/06/2014 12/15/13   Dione Booze, MD  warfarin (COUMADIN)  4 MG tablet Take 2-4 mg by mouth daily. Day1- , Day2- , Day3-  Then repeat 05/24/14   Historical Provider, MD   BP 117/73 mmHg  Pulse 72  Temp(Src) 98.2 F (36.8 C) (Oral)  Resp 14  SpO2 97% Physical Exam CONSTITUTIONAL: Well developed/well nourished HEAD: Normocephalic/atraumatic, tenderness to posterior scalp EYES: EOMI/PERRL ENMT: Mucous membranes moist NECK: supple no meningeal signs SPINE/BACK:entire spine nontender CV: irregular, no murmurs/rubs/gallops noted LUNGS: Lungs are clear to auscultation bilaterally, no apparent distress ABDOMEN: soft, nontender, no rebound or guarding, bowel sounds noted throughout abdomen GU:no cva tenderness NEURO: Pt is awake/alert/appropriate, moves all extremitiesx4.  No facial droop.  No arm or leg drift.   EXTREMITIES: pulses normal/equal, full ROM SKIN: warm, color normal PSYCH: no abnormalities  of mood noted, alert and oriented to situation  ED Course  Procedures  2:17 AM Pt with unclear cause of alteration in mental status He is now at baseline He was seen in ED on 6/30 and also 7/2.  He had two negative CT head scans.  The first was for fall and the repeat visit for continued HA ? Seizure activity at home Will defer neuroimaging as no new falls, no new weakness and pt at baseline Labs pending Neuro consult He had ICD check on 6/30 that did not reveal any ventricular arrythmia 2:22 AM D/w dr neuro dr Amada Jupiter We discussed case New onset seizure is possible Will defer new neuroimaging but may benefit from EEG though would not be available until Monday Will re-interrogate ICD while awaiting labs 3:51 AM Unable to interrogate ICD but he denies recent ICD shock Pt initially wanted to go home However he then stated he "did not feel right" and requested admission D/w dr Selena Batten.  Will admit for seizure workup May need EEG while inpatient Labs Review Labs Reviewed  CBC WITH DIFFERENTIAL/PLATELET - Abnormal; Notable for the  following:    WBC 11.3 (*)    MCH 34.1 (*)    Platelets 143 (*)    Neutro Abs 8.6 (*)    All other components within normal limits  PROTIME-INR - Abnormal; Notable for the following:    Prothrombin Time 27.6 (*)    INR 2.61 (*)    All other components within normal limits  BASIC METABOLIC PANEL  DIGOXIN LEVEL     EKG Interpretation   Date/Time:  Sunday October 16 2014 01:26:17 EDT Ventricular Rate:  80 PR Interval:    QRS Duration: 141 QT Interval:  385 QTC Calculation: 444 R Axis:   5 Text Interpretation:  Atrial fibrillation Ventricular premature complex  IVCD, consider atypical LBBB No significant change since last tracing  Confirmed by Bebe Shaggy  MD, Dorinda Hill ([id number]) on 10/16/2014 1:35:25 AM      MDM   Final diagnoses:  Transient alteration of awareness  Seizure-like activity    Nursing notes including past medical history and social history reviewed and considered in documentation Labs/vital reviewed myself and considered during evaluation Previous records reviewed and considered     Zadie Rhine, MD 10/16/14 [phone]

## 2014-10-16 NOTE — ED Notes (Signed)
[employer] at [phone] and they are having a representative call me back.

## 2014-10-16 NOTE — Progress Notes (Signed)
[name] - Stepdown/ICU TEAM Progress Note  [name] YDX:[id number] DOB: 06-21-1934 DOA: 10/16/2014 PCP: [name], MD  Admit HPI / Brief Narrative: 80 yo WM PMHx Systolic CHF (EF =72%)/[id number] Cardiomyopathy S/P ICD placement, Paroxysmal A-fib, HTN, HLD, DM type 2, COPD, OSA.  Apparently c/o syncope last Thursday, and then tonight apparently had difficulty with getting words out at 2300 last nite Lasting about 5-10 mintues. Difficulty with coordination. + headache. Pt denies numbness, tingling, focal weakness, incontinence, fever, chills. CT brain pending. Pt will be admitted for ? Partial siezure, ? Tia. , syncope.   HPI/Subjective: 7/10 A/O 4, NAD, negative neuro deficits. States has never had similar symptoms in the past. Patient's only complaint is lingering headache.  Assessment/Plan: Seizure vs TIA vs syncope and collapse -CT scan 7/10 shows 2 minute subdural hematomas.  -Dr. Coletta Memos (Neurosurgery), per patient's RN, RN [name] duodenal hematomas are minute and do not warrant any intervention. -Counseled patient that his SDHs per minute and not sure they could've caused his symptoms however have contacted neurology who will assist in fully evaluating patient. EEG?  SDH -Minute see CT results below -Patient scheduled for repeat head CT in the a.m. to determine interval change  Paroxysmal atrial fibrillation (CHADS2= 5) -EKG from 10/16/14 when compared to EKG from 09/19/14 to change. Continued LBBB, atrial fibrillation, PVCs. -Patient with SDH hold Coumadin -Received Vitamin K 5 mg; recheck INR in the a.m. -Currently rate controlled -Continue digoxin 0.25 mg daily. Obtain digoxin level -Continue Coreg 12.5 mg BID  Systolic CHF/Ischemic cardiomyopathy -Patient currently has ICD in place and states has not discharged -See paroxysmal atrial fibrillation -Echocardiogram pending  Elevated troponin -Most likely secondary to demand ischemia during  patient's seizure/TIA/fall -Trend troponins  COPD -Prior to admission patient's MAR does not reflect treatment. Albuterol nebulizers PRN  Diabetes type 2 uncontrolled -Previous hemoglobin A1c on 02/28/12= 8.1 -Hemoglobin A1c pending -Continue metformin 500 mg BID -Continue sensitive SSI  HLD  -Lipid panel pending -Continue pravastatin 40 mg daily       Code Status: FULL Family Communication:  family present at time of exam Disposition Plan: Per neurology    Consultants: Dr. [name] (neurology) Dr. Coletta Memos (Neurosurgery)  Procedure/Significant Events: 7/10 CT head without contrast;-. Mixed density Lt frontal region subdural hematoma 6 mm  -5 mm Rt frontal subdural hematoma.   Culture   Antibiotics:   DVT prophylaxis: SCD   Devices    LINES / TUBES:      Continuous Infusions: . sodium chloride 75 mL/hr at 10/16/14 8366    Objective: VITAL SIGNS: Temp: 97.8 F (36.6 C) (07/10 0932) Temp Source: Oral (07/10 0932) BP: 108/58 mmHg (07/10 1200) Pulse Rate: 54 (07/10 1200) SPO2; FIO2:   Intake/Output Summary (Last 24 hours) at 10/16/14 1218 Last data filed at 10/16/14 1200  Gross per 24 hour  Intake 426.25 ml  Output      0 ml  Net 426.25 ml     Exam: General: A/O 4, NAD,No acute respiratory distress Eyes: Positive frontal headache, eye pain, negative scleral hemorrhage ENT: Negative Runny nose, negative ear pain, negative tinnitus, negative gingival bleeding Neck:  Negative scars, masses, torticollis, lymphadenopathy, JVD Lungs: Clear to auscultation bilaterally without wheezes or crackles Cardiovascular: Irregular irregular rhythm and rate, without murmur gallop or rub normal S1 and S2 Abdomen:negative abdominal pain, negative dysphagia, Nontender, nondistended, soft, bowel sounds positive, no rebound, no ascites, no appreciable mass Extremities: No significant cyanosis, clubbing, or edema bilateral lower  extremities Psychiatric:  Negative depression, negative anxiety, negative fatigue, negative mania  Neurologic:  Cranial nerves II through XII intact, tongue/uvula midline, all extremities muscle strength 5/5, sensation intact throughout, finger nose finger bilateral within normal limits, quick finger touch bilateral within normal limits, negative dysarthria, negative expressive aphasia, negative receptive aphasia.      Data Reviewed: Basic Metabolic Panel:  Recent Labs Lab 10/16/14 0154  NA 137  K 3.7  CL 99*  CO2 28  GLUCOSE 193*  BUN 14  CREATININE 1.06  CALCIUM 8.8*   Liver Function Tests: No results for input(s): AST, ALT, ALKPHOS, BILITOT, PROT, ALBUMIN in the last 168 hours. No results for input(s): LIPASE, AMYLASE in the last 168 hours. No results for input(s): AMMONIA in the last 168 hours. CBC:  Recent Labs Lab 10/16/14 0154  WBC 11.3*  NEUTROABS 8.6*  HGB 15.1  HCT 43.0  MCV 97.1  PLT 143*   Cardiac Enzymes:  Recent Labs Lab 10/16/14 0552  TROPONINI 0.04*   BNP (last 3 results)  Recent Labs  04/05/14 1400 04/07/14 1027  BNP 502.0* 601.0*    ProBNP (last 3 results) No results for input(s): PROBNP in the last 8760 hours.  CBG:  Recent Labs Lab 10/16/14 0738 10/16/14 1151  GLUCAP 261* 145*    Recent Results (from the past 240 hour(s))  MRSA PCR Screening     Status: None   Collection Time: 10/16/14  9:36 AM  Result Value Ref Range Status   MRSA by PCR NEGATIVE NEGATIVE Final    Comment:        The GeneXpert MRSA Assay (FDA approved for NASAL specimens only), is one component of a comprehensive MRSA colonization surveillance program. It is not intended to diagnose MRSA infection nor to guide or monitor treatment for MRSA infections.      Studies:  Recent x-ray studies have been reviewed in detail by the Attending Physician  Scheduled Meds:  Scheduled Meds: . carvedilol  12.5 mg Oral BID WC  . digoxin  0.25 mg Oral Daily   . furosemide  40 mg Oral Daily  . insulin aspart  0-9 Units Subcutaneous TID WC  . magnesium oxide  400 mg Oral BID  . metFORMIN  500 mg Oral BID WC  . pravastatin  40 mg Oral Daily  . sodium chloride  3 mL Intravenous Q12H    Time spent on care of this patient: 40 mins   WOODS, Roselind Messier , MD  [facility] Office  [phone] Pager [phone]  On-Call/Text Page:      [email or web address]      password [email or web address]  If 7PM-7AM, please contact night-coverage [email or web address] Password [email or web address] 10/16/2014, 12:18 PM   LOS: 0 days   Care during the described time interval was provided by me .  I have reviewed this patient's available data, including medical history, events of note, physical examination, and all test results as part of my evaluation. I have personally reviewed and interpreted all radiology studies.   Carolyne Littles, MD [phone] Pager

## 2014-10-16 NOTE — Consult Note (Signed)
Admission H&P    Chief Complaint: Transient speech difficulty.  HPI: [name] is an 80 y.o. male history diabetes mellitus, hyperlipidemia, COPD, atrial fibrillation, ICD implantation coronary artery disease, and slurred from [facility] for further management of bilateral frontal subdural hematoma as well as evaluation for transient speech output difficulty. She experienced an apparent spell about 10 days ago. He fell from a chair and hit his head. His been complaining of a headache since then. At 11:30 PM last night he had sudden onset of inability to speak as well as inability to make any sounds verbally. There was no apparent focal weakness. He can understand a lot of what was being said to him at that time. Duration is unclear. CT scan of his head showed bilateral frontal subdural hematomas with metoprolol of mixed age involving the left side. He did not have recurrent trauma of his head last night. He's been taking Coumadin for anticoagulation. INR was 2.61. His NIH stroke score at the time of this evaluation was 0. Patient's CT scan is reviewed by neurosurgery. No intervention was indicated.  LSN: 11:30 PM on 10/15/2014 tPA Given: No: Deficits resolved mRankin:  Past Medical History  Diagnosis Date  . Arteriosclerotic cardiovascular disease (ASCVD)   . COPD (chronic obstructive pulmonary disease)   . Gout   . DJD (degenerative joint disease)   . Hyperlipidemia   . Chronic anticoagulation 2012    2012  . CHF (congestive heart failure)   . Atrial fibrillation     Onset in 2012  . Atrial flutter   . ICD (implantable cardiac defibrillator) in place   . Myocardial infarction 1998  . Ischemic cardiomyopathy   . Obstructive sleep apnea     "went away when I lost a bunch of weight" (08/17/2012)  . Type II diabetes mellitus   . Kidney stone     "just once" (08/17/2012)  . NICM (nonischemic cardiomyopathy), EF 25-30% 09/01/2012  . At risk for sudden cardiac death 2012-09-01  . Permanent atrial  fibrillation 02/15/2011    Initial onset in 03/2011 with rapid ventricular response   . S/P ICD (internal cardiac defibrillator) procedure, 08/17/12, [employer] 01-Sep-2012    [employer]    Past Surgical History  Procedure Laterality Date  . Cholecystectomy  2009  . Knee arthroplasty Left 1978    "tendon & cartilege repair" (08/17/2012)  . Vasectomy  ~ 1964  . Cystoscopy/retrograde/ureteroscopy  06/28/2011    Procedure: CYSTOSCOPY/RETROGRADE/URETEROSCOPY;  Surgeon: Marissa Nestle, MD;  Location: [facility];  Service: Urology;  Laterality: Right;  . Stone extraction with basket  06/28/2011    Procedure: STONE EXTRACTION WITH BASKET;  Surgeon: Marissa Nestle, MD;  Location: [facility];  Service: Urology;  Laterality: Right;  specimen given to family per MD  . Cardiac defibrillator placement  08/17/2012    [employer]  . Coronary angioplasty with stent placement  07/14/1996    "1" (08/17/2012)  . Cataract extraction w/ intraocular lens  implant, bilateral Bilateral ~ 2011  . US echocardiography  07/08/2012    EF <20%,mild MR,TR,LA severely dilated  . Myoview perfusion scan  06/02/2012    low risk, extensive scar entire LAD & RCA territory  . S/p icd  08/2012    [employer]  . Implantable cardioverter defibrillator implant N/A 08/17/2012    Procedure: IMPLANTABLE CARDIOVERTER DEFIBRILLATOR IMPLANT;  Surgeon: Sanda Klein, MD;  Location: [facility] CATH [facility];  Service: Cardiovascular;  Laterality: N/A;  . Cardiac catheterization  02/2003  Family History  Problem Relation Age of Onset  . Heart failure Mother   . Heart failure Father    Social History:  reports that he quit smoking about 22 years ago. His smoking use included Cigarettes. He has a 80 pack-year smoking history. He has quit using smokeless tobacco. He reports that he does not drink alcohol or use illicit drugs.  Allergies:  Allergies  Allergen Reactions  . Lipitor [Atorvastatin] Other (See Comments)    myalgias   .  Procaine Hcl Nausea And Vomiting  . Tramadol Nausea And Vomiting    Medications Prior to Admission  Medication Sig Dispense Refill  . carvedilol (COREG) 12.5 MG tablet Take 12.5 mg by mouth 2 (two) times daily with a meal.    . digoxin (LANOXIN) 0.25 MG tablet Take 1 tablet (0.25 mg total) by mouth daily. 30 tablet 3  . furosemide (LASIX) 40 MG tablet Take 1 tablet (40 mg total) by mouth daily. 30 tablet 10  . HYDROcodone-acetaminophen (NORCO/VICODIN) 5-325 MG per tablet Take 2 tablets by mouth [name] 4 (four) hours as needed for moderate pain. 20 tablet 0  . HYDROcodone-acetaminophen (NORCO/VICODIN) 5-325 MG per tablet Take 1-2 tablets by mouth [name] 4 (four) hours as needed. (Patient not taking: Reported on 10/08/2014) 6 tablet 0  . magnesium oxide (MAG-OX) 400 MG tablet Take 400 mg by mouth 2 (two) times daily.      . metFORMIN (GLUCOPHAGE) 500 MG tablet Take 500 mg by mouth 2 (two) times daily with a meal.     . Omega-3 Fatty Acids (FISH OIL BURP-LESS) 1000 MG CAPS Take 1,200 mg by mouth 2 (two) times daily.     . pravastatin (PRAVACHOL) 40 MG tablet Take 40 mg by mouth daily.    . tamsulosin (FLOMAX) 0.4 MG CAPS capsule Take 1 capsule (0.4 mg total) by mouth daily. (Patient not taking: Reported on 10/06/2014) 10 capsule 0  . warfarin (COUMADIN) 4 MG tablet Take 2-4 mg by mouth daily. Day1- $RemoveBefo'4mg'qhrzCEkwDnN$ , Day2- $Remov'2mg'qDnVWb$ , Day3- $Remov'2mg'DvjPNa$  Then repeat      ROS: History obtained from the patient  General ROS: negative for - chills, fatigue, fever, night sweats, weight gain or weight loss Psychological ROS: negative for - behavioral disorder, hallucinations, memory difficulties, mood swings or suicidal ideation Ophthalmic ROS: negative for - blurry vision, double vision, eye pain or loss of vision ENT ROS: negative for - epistaxis, nasal discharge, oral lesions, sore throat, tinnitus or vertigo Allergy and Immunology ROS: negative for - hives or itchy/watery eyes Hematological and Lymphatic ROS: negative for -  bleeding problems, bruising or swollen lymph nodes Endocrine ROS: negative for - galactorrhea, hair pattern changes, polydipsia/polyuria or temperature intolerance Respiratory ROS: negative for - cough, hemoptysis, shortness of breath or wheezing Cardiovascular ROS: negative for - chest pain, dyspnea on exertion, edema or irregular heartbeat Gastrointestinal ROS: negative for - abdominal pain, diarrhea, hematemesis, nausea/vomiting or stool incontinence Genito-Urinary ROS: negative for - dysuria, hematuria, incontinence or urinary frequency/urgency Musculoskeletal ROS: negative for - joint swelling or muscular weakness Neurological ROS: as noted in HPI Dermatological ROS: negative for rash and skin lesion changes  Physical Examination: Blood pressure 108/58, pulse 54, temperature 97.8 F (36.6 C), temperature source Oral, resp. rate 16, height $RemoveBe'5\' 4"'xWvxALHSo$  (1.626 m), weight 76.5 kg (168 lb 10.4 oz), SpO2 97 %.  HEENT-  Normocephalic, no lesions, without obvious abnormality.  Normal external eye and conjunctiva.  Normal TM's bilaterally.  Normal auditory canals and external ears. Normal external nose, mucus membranes  and septum.  Normal pharynx. Neck supple with no masses, nodes, nodules or enlargement. Cardiovascular - regular rate and rhythm, S1, S2 normal, no murmur, click, rub or gallop Lungs - chest clear, no wheezing, rales, normal symmetric air entry Abdomen - soft, non-tender; bowel sounds normal; no masses,  no organomegaly Extremities - no joint deformities, effusion, or inflammation and no edema  Neurologic Examination: Mental Status: Alert, oriented, thought content appropriate.  Speech fluent without evidence of aphasia. Able to follow commands without difficulty. Cranial Nerves: II-Visual fields were normal. III/IV/VI-Pupils were equal and reacted normally to light. Extraocular movements were full and conjugate.    V/VII-no facial numbness and no facial  weakness. VIII-normal. X-normal speech and symmetrical palatal movement. XI: trapezius strength/neck flexion strength normal bilaterally XII-midline tongue extension with normal strength. Motor: 5/5 bilaterally with normal tone and bulk Sensory: Normal throughout. Deep Tendon Reflexes: Trace only and symmetric in upper extremities and absent in lower extremities. Plantars: Flexor bilaterally Cerebellar: Normal finger-to-nose testing. Carotid auscultation: Normal  Results for orders placed or performed during the hospital encounter of 10/16/14 (from the past 48 hour(s))  Basic metabolic panel     Status: Abnormal   Collection Time: 10/16/14  1:54 AM  Result Value Ref Range   Sodium 137 135 - 145 mmol/L   Potassium 3.7 3.5 - 5.1 mmol/L   Chloride 99 (L) 101 - 111 mmol/L   CO2 28 22 - 32 mmol/L   Glucose, Bld 193 (H) 65 - 99 mg/dL   BUN 14 6 - 20 mg/dL   Creatinine, Ser 1.06 0.61 - 1.24 mg/dL   Calcium 8.8 (L) 8.9 - 10.3 mg/dL   GFR calc non Af Amer >60 >60 mL/min   GFR calc Af Amer >60 >60 mL/min    Comment: (NOTE) The eGFR has been calculated using the CKD EPI equation. This calculation has not been validated in all clinical situations. eGFR's persistently <60 mL/min signify possible Chronic Kidney Disease.    Anion gap 10 5 - 15  CBC with Differential/Platelet     Status: Abnormal   Collection Time: 10/16/14  1:54 AM  Result Value Ref Range   WBC 11.3 (H) 4.0 - 10.5 K/uL   RBC 4.43 4.22 - 5.81 MIL/uL   Hemoglobin 15.1 13.0 - 17.0 g/dL   HCT 43.0 39.0 - 52.0 %   MCV 97.1 78.0 - 100.0 fL   MCH 34.1 (H) 26.0 - 34.0 pg   MCHC 35.1 30.0 - 36.0 g/dL   RDW 13.7 11.5 - 15.5 %   Platelets 143 (L) 150 - 400 K/uL   Neutrophils Relative % 76 43 - 77 %   Neutro Abs 8.6 (H) 1.7 - 7.7 K/uL   Lymphocytes Relative 16 12 - 46 %   Lymphs Abs 1.8 0.7 - 4.0 K/uL   Monocytes Relative 7 3 - 12 %   Monocytes Absolute 0.8 0.1 - 1.0 K/uL   Eosinophils Relative 1 0 - 5 %   Eosinophils  Absolute 0.1 0.0 - 0.7 K/uL   Basophils Relative 0 0 - 1 %   Basophils Absolute 0.0 0.0 - 0.1 K/uL  Protime-INR     Status: Abnormal   Collection Time: 10/16/14  1:54 AM  Result Value Ref Range   Prothrombin Time 27.6 (H) 11.6 - 15.2 seconds   INR 2.61 (H) 0.00 - 1.49  Digoxin level     Status: None   Collection Time: 10/16/14  1:54 AM  Result Value Ref Range   Digoxin  Level 1.4 0.8 - 2.0 ng/mL  Troponin I (q 6hr x 3)     Status: Abnormal   Collection Time: 10/16/14  5:52 AM  Result Value Ref Range   Troponin I 0.04 (H) <0.031 ng/mL    Comment:        PERSISTENTLY INCREASED TROPONIN VALUES IN THE RANGE OF 0.04-0.49 ng/mL CAN BE SEEN IN:       -UNSTABLE ANGINA       -CONGESTIVE HEART FAILURE       -MYOCARDITIS       -CHEST TRAUMA       -ARRYHTHMIAS       -LATE PRESENTING MYOCARDIAL INFARCTION       -COPD   CLINICAL FOLLOW-UP RECOMMENDED.   Glucose, capillary     Status: Abnormal   Collection Time: 10/16/14  7:38 AM  Result Value Ref Range   Glucose-Capillary 261 (H) 65 - 99 mg/dL   Comment 1 Notify RN   MRSA PCR Screening     Status: None   Collection Time: 10/16/14  9:36 AM  Result Value Ref Range   MRSA by PCR NEGATIVE NEGATIVE    Comment:        The GeneXpert MRSA Assay (FDA approved for NASAL specimens only), is one component of a comprehensive MRSA colonization surveillance program. It is not intended to diagnose MRSA infection nor to guide or monitor treatment for MRSA infections.   Glucose, capillary     Status: Abnormal   Collection Time: 10/16/14 11:51 AM  Result Value Ref Range   Glucose-Capillary 145 (H) 65 - 99 mg/dL   Dg Chest 2 View  10/16/2014   CLINICAL DATA:  Altered mental status.  Syncope  EXAM: CHEST  2 VIEW  COMPARISON:  10/06/2014  FINDINGS: EKG leads causes significant artifact as they loop across the chest.  Stable positioning of single chamber right ventricular ICD/pacer lead.  Borderline cardiomegaly is stable. Stable, negative aortic  and hilar contours. There is no edema, consolidation, effusion, or pneumothorax. Cholecystectomy clips.  IMPRESSION: No active cardiopulmonary disease.   Electronically Signed   By: Monte Fantasia M.D.   On: 10/16/2014 05:42   Ct Head Wo Contrast  10/16/2014   CLINICAL DATA:  Altered mental status and seizure like activity  EXAM: CT HEAD WITHOUT CONTRAST  TECHNIQUE: Contiguous axial images were obtained from the base of the skull through the vertex without intravenous contrast.  COMPARISON:  10/08/2014  FINDINGS: Skull and Sinuses:Negative for fracture or destructive process. The mastoids, middle ears, and imaged paranasal sinuses are clear.  Orbits: Bilateral cataract resection.  No traumatic findings.  Brain: Multi focal discontinuous subdural hematoma around the left cerebral convexity which is intermediate and high density. The thickest component is in the left frontal region measuring 6 mm and with minimal mass effect on the cortex.  Thin subdural hematoma, mainly isodense, also present around the right frontal convexity.  No evidence of subarachnoid or parenchymal hemorrhage.  Generalized cortical atrophy. No evidence of acute infarct, hydrocephalus, or mass lesion.  Critical Value/emergent results were called by telephone at the time of interpretation on 10/16/2014 at 5:22 am to Dr. Christy Gentles, who verbally acknowledged these results.  IMPRESSION: 1. Mixed density subdural hematoma on the left, 6 mm in the left frontal region. 2. 5 mm right frontal subdural hematoma.   Electronically Signed   By: Monte Fantasia M.D.   On: 10/16/2014 05:25    Assessment: 80 y.o. male with multiple risk factors for stroke and bilateral  subacute frontal subdural hematomas, presenting willing transient relative. TIA cannot be ruled out. Partial seizure is less likely but cannot be ruled out as well.  Stroke Risk Factors - atrial fibrillation, diabetes mellitus, hyperlipidemia and hypertension  Plan: 1. HgbA1c, fasting  lipid panel 2. CT angiogram of head and neck with contrast 3. PT consult, OT consult, Speech consult 4. Echocardiogram 5. Prophylactic therapy-None 6. Risk factor modification 7. Telemetry monitoring  8. EEG routine adult study  C.R. Nicole Kindred, MD [facility] [phone]  10/16/2014, 12:15 PM

## 2014-10-16 NOTE — ED Notes (Signed)
[employer] returned my phone called and apparently we do not have their "little blue bag" so we are not able to interrogate pt's ICD ourselves. If need be the rep will come here and interrogate the pt's ICD. Dr. Bebe Shaggy made aware of the situation and he says we can hold of for right now.

## 2014-10-16 NOTE — Progress Notes (Signed)
I reviewed this patient's chart and saw the patient. I checked with the nursing staff and the patient is going to be transferred to [facility] this morning. He does not appear to have worsening of neurological status. I have ordered repeat CT scan of the brain tomorrow morning and blood work also.

## 2014-10-16 NOTE — ED Provider Notes (Signed)
Discussed CT findings with dr Alphonzo Lemmings He recommends coumadin reversal and monitoring He does not advise operative repair   Zadie Rhine, MD 10/16/14 [phone]

## 2014-10-16 NOTE — ED Notes (Addendum)
Pt now stating he does not want to go home. EDP aware.

## 2014-10-16 NOTE — Consult Note (Signed)
Reason for Consult:subdural hematoma Referring Physician: woods  [name] is an 80 y.o. male.  HPI: whom was admitted to [facility] yesterday after difficulty speaking, hx of a fall, and problems with cognition. Head ct showed small subdural hematomas.   Past Medical History  Diagnosis Date  . Arteriosclerotic cardiovascular disease (ASCVD)   . COPD (chronic obstructive pulmonary disease)   . Gout   . DJD (degenerative joint disease)   . Hyperlipidemia   . Chronic anticoagulation 2012    2012  . CHF (congestive heart failure)   . Atrial fibrillation     Onset in 2012  . Atrial flutter   . ICD (implantable cardiac defibrillator) in place   . Myocardial infarction 1998  . Ischemic cardiomyopathy   . Obstructive sleep apnea     "went away when I lost a bunch of weight" (08/17/2012)  . Type II diabetes mellitus   . Kidney stone     "just once" (08/17/2012)  . NICM (nonischemic cardiomyopathy), EF 25-30% Aug 28, 2012  . At risk for sudden cardiac death 28-Aug-2012  . Permanent atrial fibrillation 02/15/2011    Initial onset in 03/2011 with rapid ventricular response   . S/P ICD (internal cardiac defibrillator) procedure, 08/17/12, [employer] 2012-08-28    [employer]    Past Surgical History  Procedure Laterality Date  . Cholecystectomy  2009  . Knee arthroplasty Left 1978    "tendon & cartilege repair" (08/17/2012)  . Vasectomy  ~ 1964  . Cystoscopy/retrograde/ureteroscopy  06/28/2011    Procedure: CYSTOSCOPY/RETROGRADE/URETEROSCOPY;  Surgeon: Marissa Nestle, MD;  Location: [facility];  Service: Urology;  Laterality: Right;  . Stone extraction with basket  06/28/2011    Procedure: STONE EXTRACTION WITH BASKET;  Surgeon: Marissa Nestle, MD;  Location: [facility];  Service: Urology;  Laterality: Right;  specimen given to family per MD  . Cardiac defibrillator placement  08/17/2012    [employer]  . Coronary angioplasty with stent placement  07/14/1996    "1" (08/17/2012)  .  Cataract extraction w/ intraocular lens  implant, bilateral Bilateral ~ 2011  . US echocardiography  07/08/2012    EF <20%,mild MR,TR,LA severely dilated  . Myoview perfusion scan  06/02/2012    low risk, extensive scar entire LAD & RCA territory  . S/p icd  08/2012    [employer]  . Implantable cardioverter defibrillator implant N/A 08/17/2012    Procedure: IMPLANTABLE CARDIOVERTER DEFIBRILLATOR IMPLANT;  Surgeon: Sanda Klein, MD;  Location: [facility];  Service: Cardiovascular;  Laterality: N/A;  . Cardiac catheterization  02/2003    Family History  Problem Relation Age of Onset  . Heart failure Mother   . Heart failure Father     Social History:  reports that he quit smoking about 22 years ago. His smoking use included Cigarettes. He has a 80 pack-year smoking history. He has quit using smokeless tobacco. He reports that he does not drink alcohol or use illicit drugs.  Allergies:  Allergies  Allergen Reactions  . Lipitor [Atorvastatin] Other (See Comments)    myalgias   . Procaine Hcl Nausea And Vomiting  . Tramadol Nausea And Vomiting    Medications: I have reviewed the patient's current medications.  Results for orders placed or performed during the hospital encounter of 10/16/14 (from the past 48 hour(s))  Basic metabolic panel     Status: Abnormal   Collection Time: 10/16/14  1:54 AM  Result Value Ref Range   Sodium 137 135 - 145  mmol/L   Potassium 3.7 3.5 - 5.1 mmol/L   Chloride 99 (L) 101 - 111 mmol/L   CO2 28 22 - 32 mmol/L   Glucose, Bld 193 (H) 65 - 99 mg/dL   BUN 14 6 - 20 mg/dL   Creatinine, Ser 1.06 0.61 - 1.24 mg/dL   Calcium 8.8 (L) 8.9 - 10.3 mg/dL   GFR calc non Af Amer >60 >60 mL/min   GFR calc Af Amer >60 >60 mL/min    Comment: (NOTE) The eGFR has been calculated using the CKD EPI equation. This calculation has not been validated in all clinical situations. eGFR's persistently <60 mL/min signify possible Chronic Kidney Disease.    Anion  gap 10 5 - 15  CBC with Differential/Platelet     Status: Abnormal   Collection Time: 10/16/14  1:54 AM  Result Value Ref Range   WBC 11.3 (H) 4.0 - 10.5 K/uL   RBC 4.43 4.22 - 5.81 MIL/uL   Hemoglobin 15.1 13.0 - 17.0 g/dL   HCT 43.0 39.0 - 52.0 %   MCV 97.1 78.0 - 100.0 fL   MCH 34.1 (H) 26.0 - 34.0 pg   MCHC 35.1 30.0 - 36.0 g/dL   RDW 13.7 11.5 - 15.5 %   Platelets 143 (L) 150 - 400 K/uL   Neutrophils Relative % 76 43 - 77 %   Neutro Abs 8.6 (H) 1.7 - 7.7 K/uL   Lymphocytes Relative 16 12 - 46 %   Lymphs Abs 1.8 0.7 - 4.0 K/uL   Monocytes Relative 7 3 - 12 %   Monocytes Absolute 0.8 0.1 - 1.0 K/uL   Eosinophils Relative 1 0 - 5 %   Eosinophils Absolute 0.1 0.0 - 0.7 K/uL   Basophils Relative 0 0 - 1 %   Basophils Absolute 0.0 0.0 - 0.1 K/uL  Protime-INR     Status: Abnormal   Collection Time: 10/16/14  1:54 AM  Result Value Ref Range   Prothrombin Time 27.6 (H) 11.6 - 15.2 seconds   INR 2.61 (H) 0.00 - 1.49  Digoxin level     Status: None   Collection Time: 10/16/14  1:54 AM  Result Value Ref Range   Digoxin Level 1.4 0.8 - 2.0 ng/mL  Troponin I (q 6hr x 3)     Status: Abnormal   Collection Time: 10/16/14  5:52 AM  Result Value Ref Range   Troponin I 0.04 (H) <0.031 ng/mL    Comment:        PERSISTENTLY INCREASED TROPONIN VALUES IN THE RANGE OF 0.04-0.49 ng/mL CAN BE SEEN IN:       -UNSTABLE ANGINA       -CONGESTIVE HEART FAILURE       -MYOCARDITIS       -CHEST TRAUMA       -ARRYHTHMIAS       -LATE PRESENTING MYOCARDIAL INFARCTION       -COPD   CLINICAL FOLLOW-UP RECOMMENDED.   Glucose, capillary     Status: Abnormal   Collection Time: 10/16/14  7:38 AM  Result Value Ref Range   Glucose-Capillary 261 (H) 65 - 99 mg/dL   Comment 1 Notify RN   MRSA PCR Screening     Status: None   Collection Time: 10/16/14  9:36 AM  Result Value Ref Range   MRSA by PCR NEGATIVE NEGATIVE    Comment:        The GeneXpert MRSA Assay (FDA approved for NASAL specimens only),  is one component of  a comprehensive MRSA colonization surveillance program. It is not intended to diagnose MRSA infection nor to guide or monitor treatment for MRSA infections.   Glucose, capillary     Status: Abnormal   Collection Time: 10/16/14 11:51 AM  Result Value Ref Range   Glucose-Capillary 145 (H) 65 - 99 mg/dL    Dg Chest 2 View  10/16/2014   CLINICAL DATA:  Altered mental status.  Syncope  EXAM: CHEST  2 VIEW  COMPARISON:  10/06/2014  FINDINGS: EKG leads causes significant artifact as they loop across the chest.  Stable positioning of single chamber right ventricular ICD/pacer lead.  Borderline cardiomegaly is stable. Stable, negative aortic and hilar contours. There is no edema, consolidation, effusion, or pneumothorax. Cholecystectomy clips.  IMPRESSION: No active cardiopulmonary disease.   Electronically Signed   By: Monte Fantasia M.D.   On: 10/16/2014 05:42   Ct Head Wo Contrast  10/16/2014   CLINICAL DATA:  Altered mental status and seizure like activity  EXAM: CT HEAD WITHOUT CONTRAST  TECHNIQUE: Contiguous axial images were obtained from the base of the skull through the vertex without intravenous contrast.  COMPARISON:  10/08/2014  FINDINGS: Skull and Sinuses:Negative for fracture or destructive process. The mastoids, middle ears, and imaged paranasal sinuses are clear.  Orbits: Bilateral cataract resection.  No traumatic findings.  Brain: Multi focal discontinuous subdural hematoma around the left cerebral convexity which is intermediate and high density. The thickest component is in the left frontal region measuring 6 mm and with minimal mass effect on the cortex.  Thin subdural hematoma, mainly isodense, also present around the right frontal convexity.  No evidence of subarachnoid or parenchymal hemorrhage.  Generalized cortical atrophy. No evidence of acute infarct, hydrocephalus, or mass lesion.  Critical Value/emergent results were called by telephone at the time of  interpretation on 10/16/2014 at 5:22 am to Dr. Christy Gentles, who verbally acknowledged these results.  IMPRESSION: 1. Mixed density subdural hematoma on the left, 6 mm in the left frontal region. 2. 5 mm right frontal subdural hematoma.   Electronically Signed   By: Monte Fantasia M.D.   On: 10/16/2014 05:25    Review of Systems  HENT: Negative.   Eyes: Negative.   Cardiovascular: Negative.   Gastrointestinal: Negative.   Musculoskeletal: Positive for falls.  Skin: Negative.   Neurological: Positive for weakness.  Endo/Heme/Allergies: Negative.   Psychiatric/Behavioral: Negative.    Blood pressure 108/58, pulse 54, temperature 97.8 F (36.6 C), temperature source Oral, resp. rate 16, height $RemoveBe'5\' 4"'[id number]$  (1.626 m), weight 76.5 kg (168 lb 10.4 oz), SpO2 97 %. Physical Exam  Constitutional: He is oriented to person, place, and time. He appears well-developed and well-nourished. No distress.  HENT:  Head: Normocephalic and atraumatic.  Eyes: Conjunctivae and EOM are normal. Pupils are equal, round, and reactive to light.  Neck: Normal range of motion. Neck supple.  GI: Soft. Bowel sounds are normal.  Musculoskeletal: Normal range of motion.  Neurological: He is alert and oriented to person, place, and time. He has normal reflexes.  Skin: Skin is warm and dry.  Psychiatric: He has a normal mood and affect. His behavior is normal. Judgment and thought content normal.    Assessment/Plan: These are very small subdurals. He has had his anticoagulation stopped. The reversal protocol was initiated. No intervention indicated. Will sign off.   Shawnte Winton L 10/16/2014, 12:06 PM

## 2014-10-16 NOTE — ED Notes (Signed)
Patient alert and oriented on arrival to the ER. Patient states that he feels fine and wants to go home.

## 2014-10-16 NOTE — ED Notes (Signed)
Called out in reference to a fall. When we got there they said he was unresponsive on the toilet. Blood sugar was 117. Was up and walking around in the hall. Was seen here a week ago for a fall and has been having headaches since then.

## 2014-10-16 NOTE — Discharge Instructions (Signed)
Please be aware you may have another seizure ° °Do not drive until seen by your physician for your condition ° °Do not climb ladders/roofs/trees as a seizure can occur at that height and cause serious harm ° °Do not bathe/swim alone as a seizure can occur and cause serious harm ° °Please followup with your physician or neurologist for further testing and possible treatment ° ° °

## 2014-10-17 ENCOUNTER — Inpatient Hospital Stay (HOSPITAL_COMMUNITY): Payer: Medicare HMO

## 2014-10-17 DIAGNOSIS — I6330 Cerebral infarction due to thrombosis of unspecified cerebral artery: Secondary | ICD-10-CM | POA: Diagnosis present

## 2014-10-17 DIAGNOSIS — R55 Syncope and collapse: Secondary | ICD-10-CM

## 2014-10-17 LAB — COMPREHENSIVE METABOLIC PANEL
ALK PHOS: 50 U/L (ref 38–126)
ALT: 15 U/L — ABNORMAL LOW (ref 17–63)
AST: 18 U/L (ref 15–41)
Albumin: 3.4 g/dL — ABNORMAL LOW (ref 3.5–5.0)
Anion gap: 9 (ref 5–15)
BILIRUBIN TOTAL: 1 mg/dL (ref 0.3–1.2)
BUN: 11 mg/dL (ref 6–20)
CHLORIDE: 99 mmol/L — AB (ref 101–111)
CO2: 32 mmol/L (ref 22–32)
Calcium: 9 mg/dL (ref 8.9–10.3)
Creatinine, Ser: 1.17 mg/dL (ref 0.61–1.24)
GFR calc Af Amer: >60 mL/min (ref >60)
GFR calc non Af Amer: 58 mL/min — ABNORMAL LOW (ref >60)
Glucose, Bld: 184 mg/dL — ABNORMAL HIGH (ref 65–99)
POTASSIUM: 3.5 mmol/L (ref 3.5–5.1)
Sodium: 140 mmol/L (ref 135–145)
Total Protein: 6.1 g/dL — ABNORMAL LOW (ref 6.5–8.1)

## 2014-10-17 LAB — GLUCOSE, CAPILLARY
GLUCOSE-CAPILLARY: 173 mg/dL — AB (ref 65–99)
GLUCOSE-CAPILLARY: 160 mg/dL — AB (ref 65–99)
Glucose-Capillary: 150 mg/dL — ABNORMAL HIGH (ref 65–99)
Glucose-Capillary: 422 mg/dL — ABNORMAL HIGH (ref 65–99)

## 2014-10-17 LAB — CBC
HEMATOCRIT: 43.8 % (ref 39.0–52.0)
Hemoglobin: 15.1 g/dL (ref 13.0–17.0)
MCH: 33.6 pg (ref 26.0–34.0)
MCHC: 34.5 g/dL (ref 30.0–36.0)
MCV: 97.6 fL (ref 78.0–100.0)
PLATELETS: 142 10*3/uL — AB (ref 150–400)
RBC: 4.49 MIL/uL (ref 4.22–5.81)
RDW: 13.6 % (ref 11.5–15.5)
WBC: 8.9 10*3/uL (ref 4.0–10.5)

## 2014-10-17 LAB — HEMOGLOBIN A1C
Hgb A1c MFr Bld: 8.1 % — ABNORMAL HIGH (ref 4.8–5.6)
Hgb A1c MFr Bld: 7.9 % — ABNORMAL HIGH (ref 4.8–5.6)
MEAN PLASMA GLUCOSE: 180 mg/dL
Mean Plasma Glucose: 186 mg/dL

## 2014-10-17 LAB — PROTIME-INR
INR: 1.71 — ABNORMAL HIGH (ref 0.00–1.49)
Prothrombin Time: 20.1 seconds — ABNORMAL HIGH (ref 11.6–15.2)

## 2014-10-17 LAB — GLUCOSE, RANDOM: GLUCOSE: 231 mg/dL — AB (ref 65–99)

## 2014-10-17 MED ORDER — ALBUTEROL SULFATE (2.5 MG/3ML) 0.083% IN NEBU
2.5000 mg | INHALATION_SOLUTION | RESPIRATORY_TRACT | Status: DC | PRN
Start: 2014-10-17 — End: 2014-10-18

## 2014-10-17 MED ORDER — INSULIN ASPART 100 UNIT/ML ~~LOC~~ SOLN
0.0000 [IU] | Freq: Two times a day (BID) | SUBCUTANEOUS | Status: DC
  Administered 2014-10-17: 9 [IU] via SUBCUTANEOUS
  Administered 2014-10-18: 2 [IU] via SUBCUTANEOUS

## 2014-10-17 MED ORDER — PERFLUTREN LIPID MICROSPHERE
1.0000 mL | INTRAVENOUS | Status: DC | PRN
  Administered 2014-10-17: 10 mL via INTRAVENOUS
  Filled 2014-10-17: qty 10

## 2014-10-17 NOTE — Progress Notes (Signed)
STROKE TEAM PROGRESS NOTE    SUBJECTIVE (INTERVAL HISTORY) No family is at the bedside. is at the bedside.  Overall he feels his condition is stable. He informs me that he had a significant fall  one week ago on the back of his head and was taken to [facility]. His previous fall was 3 years ago.   OBJECTIVE Temp:  [97.5 F (36.4 C)-98.6 F (37 C)] 98.6 F (37 C) (07/11 0814) Pulse Rate:  [36-98] 68 (07/11 0814) Cardiac Rhythm:  [-] Atrial fibrillation (07/10 2000) Resp:  [10-18] 16 (07/11 0814) BP: (82-133)/(31-97) 117/60 mmHg (07/11 0814) SpO2:  [94 %-99 %] 97 % (07/11 0814) Weight:  [76.5 kg (168 lb 10.4 oz)-76.7 kg (169 lb 1.5 oz)] 76.7 kg (169 lb 1.5 oz) (07/11 0600)   Recent Labs Lab 10/16/14 1540 10/16/14 2058 10/16/14 2333 10/17/14 0339 10/17/14 0807  GLUCAP 200* 175* 201* 173* 150*    Recent Labs Lab 10/16/14 0154 10/16/14 1200 10/17/14 0211  NA 137  --  140  K 3.7  --  3.5  CL 99*  --  99*  CO2 28  --  32  GLUCOSE 193*  --  184*  BUN 14  --  11  CREATININE 1.06  --  1.17  CALCIUM 8.8*  --  9.0  MG  --  1.5*  --     Recent Labs Lab 10/17/14 0211  AST 18  ALT 15*  ALKPHOS 50  BILITOT 1.0  PROT 6.1*  ALBUMIN 3.4*    Recent Labs Lab 10/16/14 0154 10/17/14 0211  WBC 11.3* 8.9  NEUTROABS 8.6*  --   HGB 15.1 15.1  HCT 43.0 43.8  MCV 97.1 97.6  PLT 143* 142*    Recent Labs Lab 10/16/14 0552  TROPONINI 0.04*    Recent Labs  10/16/14 0154 10/17/14 0211  LABPROT 27.6* 20.1*  INR 2.61* 1.71*       Component Value Date/Time   CHOL 141 10/16/2014 1200   TRIG 532* 10/16/2014 1200   HDL 25* 10/16/2014 1200   CHOLHDL 5.6 10/16/2014 1200   VLDL UNABLE TO CALCULATE IF TRIGLYCERIDE OVER 400 mg/dL 16/01/9603 5409   LDLCALC UNABLE TO CALCULATE IF TRIGLYCERIDE OVER 400 mg/dL 81/19/1478 2956   Lab Results  Component Value Date   HGBA1C 8.1* 02/28/2012   Dg Chest 2 View  10/16/2014   CLINICAL DATA:  Altered mental status.   Syncope  EXAM: CHEST  2 VIEW  COMPARISON:  10/06/2014  FINDINGS: EKG leads causes significant artifact as they loop across the chest.  Stable positioning of single chamber right ventricular ICD/pacer lead.  Borderline cardiomegaly is stable. Stable, negative aortic and hilar contours. There is no edema, consolidation, effusion, or pneumothorax. Cholecystectomy clips.  IMPRESSION: No active cardiopulmonary disease.   Electronically Signed   By: Marnee Spring M.D.   On: 10/16/2014 05:42   Ct Head Wo Contrast  10/17/2014   CLINICAL DATA:  Followup subdural hematoma/subarachnoid hemorrhage. Altered mental status with seizure-like activity.  EXAM: CT HEAD WITHOUT CONTRAST  TECHNIQUE: Contiguous axial images were obtained from the base of the skull through the vertex without intravenous contrast.  COMPARISON:  CT head Aug 16, 2014  FINDINGS: LEFT holo hemispheric acute on chronic subdural hematoma. Intermediate data hypodense RIGHT holo hemispheric 4 mm subdural hematoma. No midline shift. No intraparenchymal hemorrhage. No hydrocephalus. No acute large vascular territory infarct. Basal cisterns are patent. Moderate calcific atherosclerosis of the carotid siphons and to lesser extent included vertebral  arteries.  Status post bilateral ocular lens implants. Paranasal sinuses and mastoid air cells are well aerated. No skull fracture.  IMPRESSION: Evolving acute on chronic LEFT holohemispheric 6 mm subdural hematoma.  5 mm subacute to chronic RIGHT holohemispheric subdural hematoma.  No midline shift.   Electronically Signed   By: Awilda Metro M.D.   On: 10/17/2014 05:07   Ct Head Wo Contrast  10/16/2014   CLINICAL DATA:  Altered mental status and seizure like activity  EXAM: CT HEAD WITHOUT CONTRAST  TECHNIQUE: Contiguous axial images were obtained from the base of the skull through the vertex without intravenous contrast.  COMPARISON:  10/08/2014  FINDINGS: Skull and Sinuses:Negative for fracture or  destructive process. The mastoids, middle ears, and imaged paranasal sinuses are clear.  Orbits: Bilateral cataract resection.  No traumatic findings.  Brain: Multi focal discontinuous subdural hematoma around the left cerebral convexity which is intermediate and high density. The thickest component is in the left frontal region measuring 6 mm and with minimal mass effect on the cortex.  Thin subdural hematoma, mainly isodense, also present around the right frontal convexity.  No evidence of subarachnoid or parenchymal hemorrhage.  Generalized cortical atrophy. No evidence of acute infarct, hydrocephalus, or mass lesion.  Critical Value/emergent results were called by telephone at the time of interpretation on 10/16/2014 at 5:22 am to Dr. Bebe Shaggy, who verbally acknowledged these results.  IMPRESSION: 1. Mixed density subdural hematoma on the left, 6 mm in the left frontal region. 2. 5 mm right frontal subdural hematoma.   Electronically Signed   By: Marnee Spring M.D.   On: 10/16/2014 05:25     PHYSICAL EXAM Pleasant elderly Caucasian male currently not in distress. . Afebrile. Head is nontraumatic. Neck is supple without bruit.    Cardiac exam no murmur or gallop. Lungs are clear to auscultation. Distal pulses are well felt. Neurological Exam :  Awake  Alert oriented x 3. Normal speech and language.eye movements full without nystagmus.fundi were not visualized. Vision acuity and fields appear normal. Hearing is normal. Palatal movements are normal. Face symmetric. Tongue midline. Normal strength, tone, reflexes and coordination. Normal sensation. Gait deferred. ASSESSMENT/PLAN [name] is a 80 y.o. male with history of diabetes mellitus, hyperlipidemia, COPD, atrial fibrillation, ICD implantation coronary artery disease, transferred from [facility] with bilateral frontal SDH.   Transient speech difficulties due to seizure vs side effect of SDH  Check EEG  Post-traumatic bilateral frontal SDH d/t  fall 1 week ago  Resultant  Neuro deficits resolved   Diet heart healthy/carb modified Room service appropriate?: Yes; Fluid consistency:: Thin; Fluid restriction:: 1200 mL Fluid  warfarin prior to admission. Recommend change to eliquis after 1 week  Ok to transfer to the floor from stroke standpoint  Disposition:  Plan return home  Atrial Fibrillation  Home anticoagulation:  Warfarin  Recommend change to eliquis in 1 week   Hypertension  Stable  Hyperlipidemia  Home meds:  pravachol  LDL unable to calculate  Continue statin at discharge  Diabetes  HgbA1c pending   Hospital day # 1 I have personally examined this patient, reviewed notes, independently viewed imaging studies, participated in medical decision making and plan of care. I have made any additions or clarifications directly to the above note. Agree with note above. He had a fall a week unremarkable aside which was likely the cause of his subdural hematoma which appears subacute. He remains at risk for neurological worsening, seizures, hematoma expansion on anticoagulation and needs  close neurological follow-up. Recommend holding anticoagulation for one week and then switching from warfarin to novel anticoagulants as patient has had trouble with fluctuating INRs  Delia Heady, MD Medical Director [facility] Pager: [phone] 10/17/2014 2:09 PM   To contact Stroke Continuity provider, please refer to [email or web address]. After hours, contact General Neurology

## 2014-10-17 NOTE — Progress Notes (Signed)
EEG completed; results pending.    

## 2014-10-17 NOTE — Progress Notes (Signed)
Inpatient Diabetes Program Recommendations  AACE/ADA: New Consensus Statement on Inpatient Glycemic Control (2013)  Target Ranges:  Prepandial:   less than 140 mg/dL      Peak postprandial:   less than 180 mg/dL (1-2 hours)      Critically ill patients:  140 - 180 mg/dL   Review of Glycemic Control:Results for IAN, FRANZONI (MRN [id number]) as of 10/17/2014 13:13  Ref. Range 10/16/2014 20:58 10/16/2014 23:33 10/17/2014 03:39 10/17/2014 08:07 10/17/2014 11:57  Glucose-Capillary Latest Ref Range: 65-99 mg/dL 803 (H) 212 (H) 248 (H) 150 (H) 160 (H)  Results for AHMI, NUNNERY (MRN [id number]) as of 10/17/2014 13:13  Ref. Range 10/16/2014 05:52 10/16/2014 12:00  Hemoglobin A1C Latest Ref Range: 4.8-5.6 % 8.1 (H) 7.9 (H)   Diabetes history: Type 2 Diabetes Outpatient Diabetes medications: Metformin 500 mg bid Current orders for Inpatient glycemic control:  Novolog sensitive tid with meals  May consider adding Lantus 15 units daily while patient is in the hospital.  Thanks, Beryl Meager, RN, BC-ADM Inpatient Diabetes Coordinator Pager [phone] (8a-5p)

## 2014-10-17 NOTE — Progress Notes (Signed)
Utilization Review Completed.Jakwan Sally T7/02/2015  

## 2014-10-17 NOTE — Evaluation (Signed)
Physical Therapy Evaluation Patient Details Name: [name] MRN: [id number] DOB: [birth year] Today's Date: 10/17/2014   History of Present Illness  80 yo M Hx Systolic CHF (EF =78%) / Ischemic Cardiomyopathy S/P ICD placement, Paroxysmal A-fib, HTN, HLD, DM2, COPD, and OSA who experienced syncope ~3 days prior to admit, and then on the night of admit develped difficulty with getting words out for a 5-10 minute period. Difficulty with coordination + headache was also noted.  Clinical Impression  Patient demonstrates modest deficits in mobility as indicated below. May benefit from continued skilled PT to address deficits and maximize function. Will see as indicated and progress as tolerated. Recommend discharge home with family when medically appropriate.    Follow Up Recommendations No PT follow up;Supervision - Intermittent    Equipment Recommendations  None recommended by PT    Recommendations for Other Services       Precautions / Restrictions Precautions Precautions: Fall      Mobility  Bed Mobility Overal bed mobility: Modified Independent                Transfers Overall transfer level: Needs assistance Equipment used: None Transfers: Sit to/from Stand Sit to Stand: Supervision            Ambulation/Gait Ambulation/Gait assistance: Min guard Ambulation Distance (Feet): 180 Feet Assistive device: 1 person hand held assist Gait Pattern/deviations: Step-through pattern;Decreased stride length;Drifts right/left;Narrow base of support Gait velocity: VCs for increased cadence, modest instability noted Gait velocity interpretation: Below normal speed for age/gender General Gait Details: decreased  Stairs            Wheelchair Mobility    Modified Rankin (Stroke Patients Only) Modified Rankin (Stroke Patients Only) Pre-Morbid Rankin Score: No symptoms Modified Rankin: Moderately severe disability     Balance Overall balance assessment: Needs  assistance Sitting-balance support: Feet supported Sitting balance-Leahy Scale: Good     Standing balance support: No upper extremity supported;During functional activity Standing balance-Leahy Scale: Fair Standing balance comment: able to perform SLS with min guard assist                             Pertinent Vitals/Pain Pain Assessment: No/denies pain    Home Living Family/patient expects to be discharged to:: Private residence Living Arrangements: Spouse/significant other Available Help at Discharge: Family;Available 24 hours/day Type of Home: Mobile home Home Access: Stairs to enter Entrance Stairs-Rails: None Entrance Stairs-Number of Steps: 2 Home Layout: One level Home Equipment: Cane - single point      Prior Function Level of Independence: Independent               Hand Dominance   Dominant Hand: Right    Extremity/Trunk Assessment   Upper Extremity Assessment: Overall WFL for tasks assessed           Lower Extremity Assessment: Overall WFL for tasks assessed         Communication   Communication: No difficulties  Cognition Arousal/Alertness: Awake/alert Behavior During Therapy: Impulsive Overall Cognitive Status: No family/caregiver present to determine baseline cognitive functioning                      General Comments      Exercises        Assessment/Plan    PT Assessment Patient needs continued PT services  PT Diagnosis Difficulty walking   PT Problem List Decreased activity tolerance;Decreased balance;Decreased safety awareness  PT  Treatment Interventions DME instruction;Gait training;Stair training;Functional mobility training;Therapeutic activities;Therapeutic exercise;Balance training;Patient/family education   PT Goals (Current goals can be found in the Care Plan section) Acute Rehab PT Goals Patient Stated Goal: to go home today PT Goal Formulation: With patient Time For Goal Achievement:  10/31/14 Potential to Achieve Goals: Good    Frequency Min 3X/week   Barriers to discharge        Co-evaluation               End of Session Equipment Utilized During Treatment: Gait belt Activity Tolerance: Patient tolerated treatment well Patient left: in bed;with call bell/phone within reach (sitting EOB) Nurse Communication: Mobility status         Time: 2458-0998 PT Time Calculation (min) (ACUTE ONLY): 18 min   Charges:   PT Evaluation $Initial PT Evaluation Tier I: 1 Procedure     PT G CodesFabio Asa 11-06-14, 4:23 PM Charlotte Crumb, PT DPT  [phone]

## 2014-10-17 NOTE — Procedures (Signed)
ELECTROENCEPHALOGRAM REPORT   Patient: [name]      Room #: 91M Age: 80 y.o.        Sex: male Referring Physician: Annie Main NP stroke Report Date:  10/17/2014        Interpreting Physician: Omelia Blackwater  History: MANUEL BLEGEN is an 80 y.o. male admitted with bilateral SDH  Medications:  Scheduled: . carvedilol  12.5 mg Oral BID WC  . digoxin  0.25 mg Oral Daily  . furosemide  40 mg Oral Daily  . insulin aspart  0-9 Units Subcutaneous TID WC  . magnesium oxide  400 mg Oral BID  . metFORMIN  500 mg Oral BID WC  . pravastatin  40 mg Oral Daily  . sodium chloride  3 mL Intravenous Q12H    Conditions of Recording:  This is a 16 channel EEG carried out with the patient in the awake state.  Description:  The waking background activity consists of a low voltage, symmetrical, fairly well organized, 8-9 Hz alpha activity, seen from the parieto-occipital and posterior temporal regions.  Low voltage fast activity, poorly organized, is seen anteriorly and is at times superimposed on more posterior regions.  A mixture of theta and alpha rhythms are seen from the central and temporal regions. No focal slowing or epileptiform activity noted.   The patient drowses with slowing to irregular, low voltage theta and beta activity. Normal sleep architecture not observed.   Hyperventilation was not performed. Intermittent photic stimulation not observed.    IMPRESSION: Normal electroencephalogram. There are no focal lateralizing or epileptiform features.   Elspeth Cho, DO [facility] [phone]  If 7pm- 7am, please page neurology on call as listed in AMION. 10/17/2014, 11:41 AM

## 2014-10-17 NOTE — Progress Notes (Signed)
  Echocardiogram 2D Echocardiogram with Definity has been performed.  Nolon Rod 10/17/2014, 1:20 PM

## 2014-10-17 NOTE — Progress Notes (Signed)
Patient arrived to 4N17 at 52. Report given by Renae Fickle from .  Patient alert and oreinted X4 with no c/o pain. Vital signs taken at charted. Telemetry Box 13  applied and CCMD notified. Oriented to room with bed alarm on . Dinner ordered. Family at bedside.

## 2014-10-17 NOTE — Progress Notes (Signed)
[facility] TEAM 1 - Stepdown/ICU TEAM Progress Note  [name] ZOX:[id number] DOB: 21-Jan-1935 DOA: 10/16/2014 PCP: [name], MD  Admit HPI / Brief Narrative: 80 yo M Hx Systolic CHF (EF =81%) / Ischemic Cardiomyopathy S/P ICD placement, Paroxysmal A-fib, HTN, HLD, DM2, COPD, and OSA who experienced syncope ~3 days prior to admit, and then on the night of admit develped difficulty with getting words out for a  5-10 minute period. Difficulty with coordination + headache was also noted.  HPI/Subjective: The pt reports a mild generalized HA.  Denies n/v, chest pain, abdom pain, or sob.  Has not yet been up or around.  Reports good appetite.    Assessment/Plan:  Word finding difficulty - Seizure vs SDH vs TIA -CT scan 7/10 noted 2 minute subdural hematomas - per NS they do not warrant intervention -EEG unrremarkable -TIA w/u underway -Stroke Team following   Post traumatic B frontal SDH -Minute and stable on f/u CT head today  -resume anticoag for Afib w/ Eliquis in 1 week per Neuro recs, but need at assess safety on feet   Paroxysmal atrial fibrillation (CHADS2 VASC = 5) -due to SDH we are holding Coumadin -Currently rate controlled -Continue digoxin 0.25 mg daily - dig slightly high  -Continue Coreg 12.5 mg BID -resume anticoag but w/ Eliquis in 1 week but only if stable on feet (fall risk may be too high to justify)  Systolic CHF / Ischemic cardiomyopathy -Patient currently has ICD in place  -TTE notes EF 20-25% w/ diffuse hypokinesis but w/ no apical thrombus noted -clinically well compensated   Elevated troponin -Most likely secondary to demand ischemia during patient's seizure/TIA/fall -troponins flat   COPD -Prior to admission patient's MAR does not reflect treatment - Albuterol nebulizers PRN  Diabetes type 2 uncontrolled -A1c 8.1 -Continue metformin 500 mg BID -Continue sensitive SSI  HLD  -Lipid panel not helpful -Continue pravastatin 40 mg  daily  Code Status: FULL Family Communication:  No family present at time of exam Disposition Plan: transfer to neuro tele bed - PT/OT evals - possible d/c home in next 24hrs   Consultants: Neurology Neurosurgery  Procedure/Significant Events: 7/10 CT head without contrast Mixed density Lt frontal region subdural hematoma 6 mm - 5 mm Rt frontal subdural hematoma. 8/11 TTE - EF 20-25% - no thrombus   Antibiotics: None  DVT prophylaxis: SCD  Objective: Blood pressure 103/56, pulse 58, temperature 98.3 F (36.8 C), temperature source Oral, resp. rate 14, height  (1.626 m), weight 76.7 kg (169 lb 1.5 oz), SpO2 95 %.  Intake/Output Summary (Last 24 hours) at 10/17/14 1441 Last data filed at 10/17/14 1200  Gross per 24 hour  Intake    840 ml  Output   1625 ml  Net   -785 ml   Exam: General: No acute respiratory distress - alert and conversant  Lungs: Clear to auscultation bilaterally without wheezes or crackles Cardiovascular: Regular rate without murmur gallop or rub normal S1 and S2 Abdomen: Nontender, nondistended, soft, bowel sounds positive, no rebound, no ascites, no appreciable mass Extremities: No significant cyanosis, clubbing, or edema bilateral lower extremities  Data Reviewed: Basic Metabolic Panel:  Recent Labs Lab 10/16/14 0154 10/16/14 1200 10/17/14 0211  NA 137  --  140  K 3.7  --  3.5  CL 99*  --  99*  CO2 28  --  32  GLUCOSE 193*  --  184*  BUN 14  --  11  CREATININE 1.06  --  1.17  CALCIUM 8.8*  --  9.0  MG  --  1.5*  --    Liver Function Tests:  Recent Labs Lab 10/17/14 0211  AST 18  ALT 15*  ALKPHOS 50  BILITOT 1.0  PROT 6.1*  ALBUMIN 3.4*   CBC:  Recent Labs Lab 10/16/14 0154 10/17/14 0211  WBC 11.3* 8.9  NEUTROABS 8.6*  --   HGB 15.1 15.1  HCT 43.0 43.8  MCV 97.1 97.6  PLT 143* 142*   Cardiac Enzymes:  Recent Labs Lab 10/16/14 0552  TROPONINI 0.04*   BNP (last 3 results)  Recent Labs  04/05/14 1400  04/07/14 1027  BNP 502.0* 601.0*    CBG:  Recent Labs Lab 10/16/14 2058 10/16/14 2333 10/17/14 0339 10/17/14 0807 10/17/14 1157  GLUCAP 175* 201* 173* 150* 160*    Recent Results (from the past 240 hour(s))  MRSA PCR Screening     Status: None   Collection Time: 10/16/14  9:36 AM  Result Value Ref Range Status   MRSA by PCR NEGATIVE NEGATIVE Final    Comment:        The GeneXpert MRSA Assay (FDA approved for NASAL specimens only), is one component of a comprehensive MRSA colonization surveillance program. It is not intended to diagnose MRSA infection nor to guide or monitor treatment for MRSA infections.      Studies:  Recent x-ray studies have been reviewed in detail by the Attending Physician  Scheduled Meds:  Scheduled Meds: . carvedilol  12.5 mg Oral BID WC  . digoxin  0.25 mg Oral Daily  . furosemide  40 mg Oral Daily  . insulin aspart  0-9 Units Subcutaneous TID WC  . magnesium oxide  400 mg Oral BID  . metFORMIN  500 mg Oral BID WC  . pravastatin  40 mg Oral Daily  . sodium chloride  3 mL Intravenous Q12H    Time spent on care of this patient: 35 mins  Lonia Blood, MD [facility] For Consults/Admissions - Flow Manager - [phone] Office  [phone]  Contact MD directly via text page:      [email or web address]      password [facility]  10/17/2014, 2:41 PM   LOS: 1 day

## 2014-10-17 NOTE — Progress Notes (Signed)
Pt refuses to wear non-skid socks while ambulating in room.  RN educated pt that it is important to wear the socks to prevent a fall and it is for the pt's safety.  Pt continues to refuse to not wear the socks. Will continue to monitor.  Estanislado Emms, RN

## 2014-10-18 DIAGNOSIS — I6330 Cerebral infarction due to thrombosis of unspecified cerebral artery: Secondary | ICD-10-CM

## 2014-10-18 DIAGNOSIS — I5022 Chronic systolic (congestive) heart failure: Secondary | ICD-10-CM | POA: Diagnosis present

## 2014-10-18 DIAGNOSIS — I609 Nontraumatic subarachnoid hemorrhage, unspecified: Secondary | ICD-10-CM | POA: Diagnosis present

## 2014-10-18 DIAGNOSIS — R404 Transient alteration of awareness: Secondary | ICD-10-CM

## 2014-10-18 DIAGNOSIS — I429 Cardiomyopathy, unspecified: Secondary | ICD-10-CM | POA: Diagnosis present

## 2014-10-18 LAB — GLUCOSE, CAPILLARY
GLUCOSE-CAPILLARY: 150 mg/dL — AB (ref 65–99)
Glucose-Capillary: 184 mg/dL — ABNORMAL HIGH (ref 65–99)

## 2014-10-18 MED ORDER — METFORMIN HCL 500 MG PO TABS: 1000.0000 mg | ORAL_TABLET | Freq: Two times a day (BID) | ORAL | Status: DC

## 2014-10-18 MED ORDER — METFORMIN HCL 1000 MG PO TABS: 1000.0000 mg | ORAL_TABLET | Freq: Two times a day (BID) | ORAL | Status: DC

## 2014-10-18 NOTE — Progress Notes (Signed)
Dc info given, transported to lobby per wc with family , dc'd to care of daughter.

## 2014-10-18 NOTE — Progress Notes (Signed)
STROKE TEAM PROGRESS NOTE    SUBJECTIVE (INTERVAL HISTORY) No family is at the bedside. is at the bedside.  Overall he feels his condition is stable. He informs me that he had a significant fall  one week ago on the back of his head and was taken to [facility]. His previous fall was 3 years ago.   OBJECTIVE: stable no complaints likely going home today Temp:  [97.7 F (36.5 C)-98.8 F (37.1 C)] 98.2 F (36.8 C) (07/12 0959) Pulse Rate:  [51-74] 64 (07/12 0959) Cardiac Rhythm:  [-] Atrial fibrillation (07/12 0834) Resp:  [15-20] 17 (07/12 0959) BP: (97-114)/(48-68) 108/62 mmHg (07/12 0959) SpO2:  [96 %-98 %] 98 % (07/12 0959) Weight:  [170 lb 3.1 oz (77.2 kg)] 170 lb 3.1 oz (77.2 kg) (07/12 0500)   Recent Labs Lab 10/17/14 0807 10/17/14 1157 10/17/14 2120 10/18/14 0646 10/18/14 1023  GLUCAP 150* 160* 422* 150* 184*    Recent Labs Lab 10/16/14 0154 10/16/14 1200 10/17/14 0211 10/17/14 2222  NA 137  --  140  --   K 3.7  --  3.5  --   CL 99*  --  99*  --   CO2 28  --  32  --   GLUCOSE 193*  --  184* 231*  BUN 14  --  11  --   CREATININE 1.06  --  1.17  --   CALCIUM 8.8*  --  9.0  --   MG  --  1.5*  --   --     Recent Labs Lab 10/17/14 0211  AST 18  ALT 15*  ALKPHOS 50  BILITOT 1.0  PROT 6.1*  ALBUMIN 3.4*    Recent Labs Lab 10/16/14 0154 10/17/14 0211  WBC 11.3* 8.9  NEUTROABS 8.6*  --   HGB 15.1 15.1  HCT 43.0 43.8  MCV 97.1 97.6  PLT 143* 142*    Recent Labs Lab 10/16/14 0552  TROPONINI 0.04*    Recent Labs  10/16/14 0154 10/17/14 0211  LABPROT 27.6* 20.1*  INR 2.61* 1.71*       Component Value Date/Time   CHOL 141 10/16/2014 1200   TRIG 532* 10/16/2014 1200   HDL 25* 10/16/2014 1200   CHOLHDL 5.6 10/16/2014 1200   VLDL UNABLE TO CALCULATE IF TRIGLYCERIDE OVER 400 mg/dL 63/78/5885 0277   LDLCALC UNABLE TO CALCULATE IF TRIGLYCERIDE OVER 400 mg/dL 41/28/7867 6720   Lab Results  Component Value Date   HGBA1C 7.9*  10/16/2014   Ct Head Wo Contrast  10/17/2014   CLINICAL DATA:  Followup subdural hematoma/subarachnoid hemorrhage. Altered mental status with seizure-like activity.  EXAM: CT HEAD WITHOUT CONTRAST  TECHNIQUE: Contiguous axial images were obtained from the base of the skull through the vertex without intravenous contrast.  COMPARISON:  CT head Aug 16, 2014  FINDINGS: LEFT holo hemispheric acute on chronic subdural hematoma. Intermediate data hypodense RIGHT holo hemispheric 4 mm subdural hematoma. No midline shift. No intraparenchymal hemorrhage. No hydrocephalus. No acute large vascular territory infarct. Basal cisterns are patent. Moderate calcific atherosclerosis of the carotid siphons and to lesser extent included vertebral arteries.  Status post bilateral ocular lens implants. Paranasal sinuses and mastoid air cells are well aerated. No skull fracture.  IMPRESSION: Evolving acute on chronic LEFT holohemispheric 6 mm subdural hematoma.  5 mm subacute to chronic RIGHT holohemispheric subdural hematoma.  No midline shift.   Electronically Signed   By: Awilda Metro M.D.   On: 10/17/2014 05:07  PHYSICAL EXAM Pleasant elderly Caucasian male currently not in distress. . Afebrile. Head is nontraumatic. Neck is supple without bruit.    Cardiac exam no murmur or gallop. Lungs are clear to auscultation. Distal pulses are well felt. Neurological Exam :  Awake  Alert oriented x 3. Normal speech and language.eye movements full without nystagmus.fundi were not visualized. Vision acuity and fields appear normal. Hearing is normal. Palatal movements are normal. Face symmetric. Tongue midline. Normal strength, tone, reflexes and coordination. Normal sensation. Gait deferred. ASSESSMENT/PLAN [name] is a 80 y.o. male with history of diabetes mellitus, hyperlipidemia, COPD, atrial fibrillation, ICD implantation coronary artery disease, transferred from [facility] with bilateral frontal SDH.   Transient  speech difficulties due to seizure vs side effect of SDH  Check EEG  Post-traumatic bilateral frontal SDH d/t fall 1 week ago  Resultant  Neuro deficits resolved  Diet heart healthy/carb modified Room service appropriate?: Yes; Fluid consistency:: Thin; Fluid restriction:: 1200 mL Fluid  warfarin prior to admission. Recommend change to eliquis after 1 week  Ok to transfer to the floor from stroke standpoint  Disposition:  Plan return home  Atrial Fibrillation  Home anticoagulation:  Warfarin  Recommend change to eliquis in 1 week   Hypertension  Stable  Hyperlipidemia  Home meds:  pravachol  LDL unable to calculate  Continue statin at discharge  Diabetes  HgbA1c pending   Hospital day # 2 I have personally examined this patient, reviewed notes, independently viewed imaging studies, participated in medical decision making and plan of care. I have made any additions or clarifications directly to the above note. Agree with note above. He had a fall a week unremarkable aside which was likely the cause of his subdural hematoma which appears subacute.  Recommend holding anticoagulation for one week and then switching from warfarin to novel anticoagulants as patient has had trouble with fluctuating INRs. Long discussion with patient and wife at the bedside and answered questions. Stroke team will sign off. Kindly call for questions.  Delia Heady, MD Medical Director [facility] Pager: [phone] 10/18/2014 1:36 PM   To contact Stroke Continuity provider, please refer to [email or web address]. After hours, contact General Neurology

## 2014-10-18 NOTE — Progress Notes (Signed)
Physical Therapy Treatment Patient Details Name: [name] MRN: [id number] DOB: [birth year] Today's Date: 10/18/2014    History of Present Illness 80 yo M Hx Systolic CHF (EF =40%) / Ischemic Cardiomyopathy S/P ICD placement, Paroxysmal A-fib, HTN, HLD, DM2, COPD, and OSA who experienced syncope ~3 days prior to admit, and then on the night of admit develped difficulty with getting words out for a 5-10 minute period. Difficulty with coordination + headache was also noted.    PT Comments    Patient progressing well with mobility, performed ambulation in hall without device, no instability present this session. Performed stair negotiation without assist. Anticipate patient will be safe for d/c home.  Follow Up Recommendations  No PT follow up;Supervision - Intermittent     Equipment Recommendations  None recommended by PT    Recommendations for Other Services       Precautions / Restrictions Precautions Precautions: Fall Restrictions Weight Bearing Restrictions: No    Mobility  Bed Mobility               General bed mobility comments: pt seated EOB upon arrival  Transfers Overall transfer level: Needs assistance Equipment used: None Transfers: Sit to/from Stand Sit to Stand: Supervision            Ambulation/Gait Ambulation/Gait assistance: Independent Ambulation Distance (Feet): 210 Feet Assistive device: None Gait Pattern/deviations: Step-through pattern;Decreased stride length;Drifts right/left;Narrow base of support     General Gait Details: modestly decreased   Stairs Stairs: Yes Stairs assistance: Supervision Stair Management: One rail Left;Alternating pattern (step to method for descent) Number of Stairs: 12 General stair comments: Patient performed without physical assist  Wheelchair Mobility    Modified Rankin (Stroke Patients Only)       Balance Overall balance assessment: No apparent balance deficits (not formally  assessed) Sitting-balance support: Feet supported Sitting balance-Leahy Scale: Good     Standing balance support: No upper extremity supported;During functional activity Standing balance-Leahy Scale: Good                      Cognition Arousal/Alertness: Awake/alert Behavior During Therapy: Impulsive Overall Cognitive Status: Within Functional Limits for tasks assessed                      Exercises      General Comments        Pertinent Vitals/Pain Pain Assessment: 0-10 Pain Score: 4  Pain Location: head Pain Descriptors / Indicators: Headache Pain Intervention(s): Patient requesting pain meds-RN notified;Monitored during session    Home Living Family/patient expects to be discharged to:: Private residence Living Arrangements: Spouse/significant other Available Help at Discharge: Family;Available 24 hours/day Type of Home: Mobile home Home Access: Stairs to enter Entrance Stairs-Rails: None Home Layout: One level Home Equipment: Cane - single point      Prior Function Level of Independence: Independent      Comments: drives   PT Goals (current goals can now be found in the care plan section) Acute Rehab PT Goals Patient Stated Goal: to go home PT Goal Formulation: With patient Time For Goal Achievement: 10/31/14 Potential to Achieve Goals: Good Progress towards PT goals: Progressing toward goals    Frequency  Min 3X/week    PT Plan Current plan remains appropriate    Co-evaluation             End of Session Equipment Utilized During Treatment: Gait belt Activity Tolerance: Patient tolerated treatment well Patient left: in chair;with call  bell/phone within reach;with family/visitor present     Time: 0906-0924 PT Time Calculation (min) (ACUTE ONLY): 18 min  Charges:  $Gait Training: 8-22 mins                    G CodesFabio Asa 05-Nov-2014, 9:28 AM Charlotte Crumb, PT DPT  [phone]

## 2014-10-18 NOTE — Care Management Important Message (Signed)
Important Message  Patient Details  Name: PHOENIX DREY MRN: [id number] Date of Birth: [birth year]   Medicare Important Message Given:  Yes-second notification given    Orson Aloe 10/18/2014, 1:06 PM

## 2014-10-18 NOTE — Evaluation (Signed)
Occupational Therapy Evaluation Patient Details Name: Patton NBARACK NICODEMUSN: [id number] DOB: 1934/09/19 Today's Date: 10/18/2014    History of Present Illness 80 yo M Hx Systolic CHF (EF =40%) / Ischemic Cardiomyopathy S/P ICD placement, Paroxysmal A-fib, HTN, HLD, DM2, COPD, and OSA who experienced syncope ~3 days prior to admit, and then on the night of admit develped difficulty with getting words out for a 5-10 minute period. Difficulty with coordination + headache was also noted.   Clinical Impression   Pt and spouse report Pt is back to baseline; denies vision/ sensation/ speech deficits. Pt able to complete ADLs with supervision and no LOB. Education provided on safety to prevent falls at home. No further OT needed at this time.     Follow Up Recommendations  No OT follow up;Supervision/Assistance - 24 hour    Equipment Recommendations  None recommended by OT    Recommendations for Other Services       Precautions / Restrictions Precautions Precautions: Fall Restrictions Weight Bearing Restrictions: No      Mobility Bed Mobility               General bed mobility comments: pt seated EOB upon arrival  Transfers Overall transfer level: Needs assistance Equipment used: None Transfers: Sit to/from Stand Sit to Stand: Supervision              Balance Overall balance assessment: No apparent balance deficits (not formally assessed) Sitting-balance support: Feet supported       Standing balance support: No upper extremity supported;During functional activity                                ADL Overall ADL's : At baseline                                       General ADL Comments: Pt able to complete standing toileting and grooming activities with no LOB. Pt able to step over objects in room with no LOB.     Vision Vision Assessment?: No apparent visual deficits   Perception     Praxis      Pertinent Vitals/Pain Pain  Assessment: No/denies pain     Hand Dominance Right   Extremity/Trunk Assessment Upper Extremity Assessment Upper Extremity Assessment: Overall WFL for tasks assessed   Lower Extremity Assessment Lower Extremity Assessment: Overall WFL for tasks assessed       Communication Communication Communication: No difficulties   Cognition Arousal/Alertness: Awake/alert Behavior During Therapy: Impulsive Overall Cognitive Status: Within Functional Limits for tasks assessed                     General Comments       Exercises       Shoulder Instructions      Home Living Family/patient expects to be discharged to:: Private residence Living Arrangements: Spouse/significant other Available Help at Discharge: Family;Available 24 hours/day Type of Home: Mobile home Home Access: Stairs to enter Entrance Stairs-Number of Steps: 2 Entrance Stairs-Rails: None Home Layout: One level     Bathroom Shower/Tub: Tub/shower unit Shower/tub characteristics: Engineer, building services: Standard     Home Equipment: Cane - single point          Prior Functioning/Environment Level of Independence: Independent        Comments: drives    OT Diagnosis:  Generalized weakness   OT Problem List: Decreased activity tolerance;Decreased safety awareness   OT Treatment/Interventions:      OT Goals(Current goals can be found in the care plan section) Acute Rehab OT Goals Patient Stated Goal: to go home today OT Goal Formulation: With patient Time For Goal Achievement: 11/01/14 Potential to Achieve Goals: Good  OT Frequency:     Barriers to D/C:            Co-evaluation              End of Session Equipment Utilized During Treatment: Gait belt Nurse Communication: Mobility status  Activity Tolerance: Patient tolerated treatment well Patient left: in chair;with call bell/phone within reach;with family/visitor present   Time: 9211-9417 OT Time Calculation (min): 24  min Charges:    G-Codes:    Marden Noble 10/18/2014, 9:10 AM

## 2014-10-18 NOTE — Discharge Summary (Addendum)
Physician Discharge Summary  [name]:[id number] DOB: Aug 01, 1934 DOA: 10/16/2014  PCP: [name] Stalling, MD  Admit date: 10/16/2014 Discharge date: 10/18/2014  Time spent: 40 minutes  Recommendations for Outpatient Follow-up:  Seizure vs TIA vs syncope and collapse; presumed TIA -CT scan 7/10 shows 2 minute subdural hematomas.  -Dr. Coletta Memos (Neurosurgery), per patient's RN, RN [name] SDH are minute and do not warrant any intervention. -EEG; normal -Follow-up in 2 days with Dr. Shon [name] (neurology)/Dr. Arie [name].(Neurology); will need outpatient carotid Doppler study -Patient follow-up with PCP; will need outpatient carotid Dopplers study.   Post traumatic B frontal SDH -Minute see CT results below -Repeat head CT on 7/11 showed no change.  -Patient will follow-up with neurology within 2 days of discharge with Dr. Shon [name] (neurology)/Dr. Arie [name].Neurology). And PCP within a week of discharge. -resume anticoag for Afib w/ Eliquis in 1 week per Neuro recs, but need at assess safety on feet   Paroxysmal atrial fibrillation (CHADS2= 5) -EKG from 10/16/14 when compared to EKG from 09/19/14 to change. Continued LBBB, atrial fibrillation, PVCs. -Patient with SDH, held Coumadin on admission; resume anticoagulation w/ Eliquis in 1 week after cleared by neurology -Currently rate controlled -Continue digoxin 0.25 mg daily. -Continue Coreg 12.5 mg BID  Systolic CHF/Ischemic cardiomyopathy -See paroxysmal atrial fibrillation -Echocardiogram; slight decrease in EF see report below  Elevated troponin -Most likely secondary to demand ischemia during patient's seizure/TIA/fall  COPD -Prior to admission patient's MAR does not reflect treatment. Albuterol nebulizers PRN  Diabetes type 2 uncontrolled -Previous hemoglobin A1c on 02/28/12= 7.9 -Hemoglobin A1c pending -Discharge Metformin 1000 mg BID -PCP to titrate diabetic medication as he is uncontrolled  HLD   -Continue pravastatin 40 mg daily     Discharge Diagnoses:  Active Problems:   Seizure-like activity   Subdural hematoma   Syncope and collapse   Paroxysmal atrial fibrillation   Chronic systolic heart failure   Elevated troponin   Other emphysema   Diabetes type 2, uncontrolled   HLD (hyperlipidemia)   Cerebral thrombosis with cerebral infarction   Transient alteration of awareness   Systolic CHF, chronic   Cardiomyopathy   Subarachnoid hemorrhage   Discharge Condition: Stable  Diet recommendation: Diabetic  Filed Weights   10/16/14 0932 10/17/14 0600 10/18/14 0500  Weight: 76.5 kg (168 lb 10.4 oz) 76.7 kg (169 lb 1.5 oz) 77.2 kg (170 lb 3.1 oz)    History of present illness:  80 yo WM PMHx Systolic CHF (EF =81%)/[id number] Cardiomyopathy S/P ICD placement, Paroxysmal A-fib, HTN, HLD, DM type 2, COPD, OSA.  Apparently c/o syncope last Thursday, and then tonight apparently had difficulty with getting words out at 2300 last nite Lasting about 5-10 mintues. Difficulty with coordination. + headache. Pt denies numbness, tingling, focal weakness, incontinence, fever, chills. CT brain pending. Pt will be admitted for ? Partial siezure, ? Tia. , syncope.  In his hospitalization patient was worked up for CVA vs TIA vs seizure. Patient was asymptomatic by the time he was transferred to [facility], and his workup was negative for cause. Presumed TIA possibly secondary to his subdural hematoma.   Consultants: Dr. Noel [name] (neurology) Dr. Coletta Memos (Neurosurgery)  Procedure/Significant Events: 7/10 CT head without contrast;-. Mixed density Lt frontal region subdural hematoma 6 mm  -5 mm Rt frontal subdural hematoma. 7/11 EEG; normal 7/11 repeat CT head without contrast; no interval change 7/11 echocardiogram;Left ventricle: mild LVH. LVEF= 20% to 25%. Diffuse hypokinesis. Akinesis Anteroseptal and Apical myocardium.- Pulmonary  arteries:  PApeak pressure: 33 mm Hg  (S).     Discharge Exam: Filed Vitals:   10/18/14 0155 10/18/14 0500 10/18/14 0548 10/18/14 0959  BP: 114/66  101/68 108/62  Pulse: 60  56 64  Temp: 97.9 F (36.6 C)  98.8 F (37.1 C) 98.2 F (36.8 C)  TempSrc: Oral  Oral Oral  Resp: 20  20 17   Height:      Weight:  77.2 kg (170 lb 3.1 oz)    SpO2: 97%  98% 98%    General: A/O 4, NAD,No acute respiratory distress Eyes: Positive frontal headache, eye pain, negative scleral hemorrhage ENT: Negative Runny nose, negative ear pain, negative tinnitus, negative gingival bleeding Neck: Negative scars, masses, torticollis, lymphadenopathy, JVD Lungs: Clear to auscultation bilaterally without wheezes or crackles Cardiovascular: Irregular irregular rhythm and rate, without murmur gallop or rub normal S1 and S2 Abdomen:negative abdominal pain, negative dysphagia, Nontender, nondistended, soft, bowel sounds positive, no rebound, no ascites, no appreciable mass Extremities: No significant cyanosis, clubbing, or edema bilateral lower extremities Psychiatric: Negative depression, negative anxiety, negative fatigue, negative mania  Neurologic: Cranial nerves II through XII intact, tongue/uvula midline, all extremities muscle strength 5/5, sensation intact throughout, finger nose finger bilateral within normal limits, quick finger touch bilateral within normal limits, negative dysarthria, negative expressive aphasia, negative receptive aphasia.   Discharge Instructions     Medication List    ASK your doctor about these medications        carvedilol 12.5 MG tablet  Commonly known as:  COREG  Take 12.5 mg by mouth 2 (two) times daily with a meal.     digoxin 0.25 MG tablet  Commonly known as:  LANOXIN  Take 1 tablet (0.25 mg total) by mouth daily.     FISH OIL BURP-LESS 1000 MG Caps  Take 1,200 mg by mouth 2 (two) times daily.     furosemide 40 MG tablet  Commonly known as:  LASIX  Take 1 tablet (40 mg total) by mouth daily.      HYDROcodone-acetaminophen 5-325 MG per tablet  Commonly known as:  NORCO/VICODIN  Take 2 tablets by mouth every 4 (four) hours as needed for moderate pain.     magnesium oxide 400 MG tablet  Commonly known as:  MAG-OX  Take 400 mg by mouth 2 (two) times daily.     metFORMIN 500 MG tablet  Commonly known as:  GLUCOPHAGE  Take 500 mg by mouth 2 (two) times daily with a meal.     pravastatin 40 MG tablet  Commonly known as:  PRAVACHOL  Take 40 mg by mouth daily.     warfarin 4 MG tablet  Commonly known as:  COUMADIN  Take 2-4 mg by mouth daily. Day1- 4mg , Day2- 2mg , Day3- 2mg  Then repeat       Allergies  Allergen Reactions  . Lipitor [Atorvastatin] Other (See Comments)    myalgias   . Percocet [Oxycodone-Acetaminophen] Nausea And Vomiting    Needs to drink milk and take a Zofran  . Procaine Hcl Nausea And Vomiting  . Tramadol Nausea And Vomiting  . Vicodin [Hydrocodone-Acetaminophen] Nausea And Vomiting   Follow-up Information    Follow up with Beryle Beams, MD.   Specialty:  Neurology   Contact information:   [address] [place] [address] [phone]       Follow up with Cira Servant, DO. Call in 2 days.   Specialty:  Neurology   Contact information:   [address] [place] [address] [phone]       Follow up with [name] Stalling, MD. Schedule an appointment as soon as possible for a visit in 1 week.   Specialty:  Internal Medicine   Why:  Follow-up within 7 days with Dr. Gerlene Burdock [facility] hospitalization TIA. Arrange for carotid Doppler study   Contact information:   [address] [place] [address] [phone]        The results of significant diagnostics from this hospitalization (including imaging, microbiology, ancillary and laboratory) are listed below for reference.    Significant Diagnostic Studies: Dg Chest 2 View  10/16/2014   CLINICAL DATA:  Altered mental status.  Syncope  EXAM: CHEST   2 VIEW  COMPARISON:  10/06/2014  FINDINGS: EKG leads causes significant artifact as they loop across the chest.  Stable positioning of single chamber right ventricular ICD/pacer lead.  Borderline cardiomegaly is stable. Stable, negative aortic and hilar contours. There is no edema, consolidation, effusion, or pneumothorax. Cholecystectomy clips.  IMPRESSION: No active cardiopulmonary disease.   Electronically Signed   By: Marnee Spring M.D.   On: 10/16/2014 05:42   Dg Chest 2 View  10/06/2014   CLINICAL DATA:  Mid upper back pain after falling out of a chair in the lawn this evening.  EXAM: CHEST  2 VIEW  COMPARISON:  04/05/2014  FINDINGS: There is mild unchanged cardiomegaly. The transvenous lead appears intact. There is no pneumothorax. Mediastinal contours are normal and unchanged. The lungs are clear. There is no effusion. There is an acute minimally displaced fracture at the lateral aspect of the left fifth rib. Portions of the left second through sixth ribs are obscured by the cardiac pacing device.  IMPRESSION: Minimally displaced fracture at the lateral aspect of the left fifth rib.   Electronically Signed   By: Ellery Plunk M.D.   On: 10/06/2014 21:44   Dg Thoracic Spine 2 View  10/06/2014   CLINICAL DATA:  Mid back pain following falling from chair, initial encounter  EXAM: THORACIC SPINE - 2-3 VIEWS  COMPARISON:  04/05/2014  FINDINGS: Multilevel osteophytic changes are seen. No compression deformities are noted. No paraspinal mass lesion is seen. The visualized rib cage is within normal limits. A pacing device is again seen and stable.  IMPRESSION: No acute abnormality noted.  Multilevel degenerative change is seen.   Electronically Signed   By: Alcide Clever M.D.   On: 10/06/2014 21:47   Ct Head Wo Contrast  10/17/2014   CLINICAL DATA:  Followup subdural hematoma/subarachnoid hemorrhage. Altered mental status with seizure-like activity.  EXAM: CT HEAD WITHOUT CONTRAST  TECHNIQUE:  Contiguous axial images were obtained from the base of the skull through the vertex without intravenous contrast.  COMPARISON:  CT head Aug 16, 2014  FINDINGS: LEFT holo hemispheric acute on chronic subdural hematoma. Intermediate data hypodense RIGHT holo hemispheric 4 mm subdural hematoma. No midline shift. No intraparenchymal hemorrhage. No hydrocephalus. No acute large vascular territory infarct. Basal cisterns are patent. Moderate calcific atherosclerosis of the carotid siphons and to lesser extent included vertebral arteries.  Status post bilateral ocular lens implants. Paranasal sinuses and mastoid air cells are well aerated. No skull fracture.  IMPRESSION: Evolving acute on chronic LEFT holohemispheric 6 mm subdural hematoma.  5 mm subacute to chronic RIGHT holohemispheric subdural hematoma.  No midline shift.   Electronically Signed   By: Awilda Metro M.D.   On: 10/17/2014 05:07   Ct Head Wo Contrast  10/16/2014   CLINICAL DATA:  Altered mental status and seizure like activity  EXAM: CT HEAD WITHOUT CONTRAST  TECHNIQUE: Contiguous axial images were obtained from the base of the skull through the vertex without intravenous contrast.  COMPARISON:  10/08/2014  FINDINGS: Skull and Sinuses:Negative for fracture or destructive process. The mastoids, middle ears, and imaged paranasal sinuses are clear.  Orbits: Bilateral cataract resection.  No traumatic findings.  Brain: Multi focal discontinuous subdural hematoma around the left cerebral convexity which is intermediate and high density. The thickest component is in the left frontal region measuring 6 mm and with minimal mass effect on the cortex.  Thin subdural hematoma, mainly isodense, also present around the right frontal convexity.  No evidence of subarachnoid or parenchymal hemorrhage.  Generalized cortical atrophy. No evidence of acute infarct, hydrocephalus, or mass lesion.  Critical Value/emergent results were called by telephone at the time of  interpretation on 10/16/2014 at 5:22 am to Dr. Bebe Shaggy, who verbally acknowledged these results.  IMPRESSION: 1. Mixed density subdural hematoma on the left, 6 mm in the left frontal region. 2. 5 mm right frontal subdural hematoma.   Electronically Signed   By: Marnee Spring M.D.   On: 10/16/2014 05:25   Ct Head Wo Contrast  10/08/2014   CLINICAL DATA:  Status post fall. Left posterior headache and hearing noises on the left. Initial encounter.  EXAM: CT HEAD WITHOUT CONTRAST  TECHNIQUE: Contiguous axial images were obtained from the base of the skull through the vertex without intravenous contrast.  COMPARISON:  CT of the head performed 10/06/2014  FINDINGS: There is no evidence of acute infarction, mass lesion, or intra- or extra-axial hemorrhage on CT.  Prominence of the ventricles and sulci reflects moderate cortical volume loss. Cerebellar atrophy is noted.  The brainstem and fourth ventricle are within normal limits. The basal ganglia are unremarkable in appearance. The cerebral hemispheres demonstrate grossly normal gray-white differentiation. No mass effect or midline shift is seen.  There is no evidence of fracture; visualized osseous structures are unremarkable in appearance. The visualized portions of the orbits are within normal limits. The paranasal sinuses and mastoid air cells are well-aerated. No significant soft tissue abnormalities are seen.  IMPRESSION: 1. No evidence of traumatic intracranial injury or fracture. 2. Moderate cortical volume loss noted.   Electronically Signed   By: Roanna Raider M.D.   On: 10/08/2014 02:51   Ct Head Wo Contrast  10/06/2014   CLINICAL DATA:  Posttraumatic headache after falling out of chair in yard today. No loss of consciousness.  EXAM: CT HEAD WITHOUT CONTRAST  CT CERVICAL SPINE WITHOUT CONTRAST  TECHNIQUE: Multidetector CT imaging of the head and cervical spine was performed following the standard protocol without intravenous contrast. Multiplanar CT  image reconstructions of the cervical spine were also generated.  COMPARISON:  CT scan of head of August 02, 2013.  FINDINGS: CT HEAD FINDINGS  Bony calvarium appears intact. Mild diffuse cortical atrophy is noted. No mass effect or midline shift is noted. Ventricular size is within normal limits. There is no evidence of mass lesion, hemorrhage or acute infarction.  CT CERVICAL SPINE FINDINGS  No fracture or spondylolisthesis is noted. Mild degenerative disc disease is noted at C6-7. Posterior facet joints appear normal.  IMPRESSION: Mild diffuse cortical atrophy. No acute intracranial abnormality seen.  Mild degenerative disc disease is noted at C6-7. No acute abnormality seen in cervical spine.   Electronically Signed   By: Lupita Raider, M.D.  On: 10/06/2014 21:36   Ct Cervical Spine Wo Contrast  10/06/2014   CLINICAL DATA:  Posttraumatic headache after falling out of chair in yard today. No loss of consciousness.  EXAM: CT HEAD WITHOUT CONTRAST  CT CERVICAL SPINE WITHOUT CONTRAST  TECHNIQUE: Multidetector CT imaging of the head and cervical spine was performed following the standard protocol without intravenous contrast. Multiplanar CT image reconstructions of the cervical spine were also generated.  COMPARISON:  CT scan of head of August 02, 2013.  FINDINGS: CT HEAD FINDINGS  Bony calvarium appears intact. Mild diffuse cortical atrophy is noted. No mass effect or midline shift is noted. Ventricular size is within normal limits. There is no evidence of mass lesion, hemorrhage or acute infarction.  CT CERVICAL SPINE FINDINGS  No fracture or spondylolisthesis is noted. Mild degenerative disc disease is noted at C6-7. Posterior facet joints appear normal.  IMPRESSION: Mild diffuse cortical atrophy. No acute intracranial abnormality seen.  Mild degenerative disc disease is noted at C6-7. No acute abnormality seen in cervical spine.   Electronically Signed   By: Lupita Raider, M.D.   On: 10/06/2014 21:36     Microbiology: Recent Results (from the past 240 hour(s))  MRSA PCR Screening     Status: None   Collection Time: 10/16/14  9:36 AM  Result Value Ref Range Status   MRSA by PCR NEGATIVE NEGATIVE Final    Comment:        The GeneXpert MRSA Assay (FDA approved for NASAL specimens only), is one component of a comprehensive MRSA colonization surveillance program. It is not intended to diagnose MRSA infection nor to guide or monitor treatment for MRSA infections.      Labs: Basic Metabolic Panel:  Recent Labs Lab 10/16/14 0154 10/16/14 1200 10/17/14 0211 10/17/14 2222  NA 137  --  140  --   K 3.7  --  3.5  --   CL 99*  --  99*  --   CO2 28  --  32  --   GLUCOSE 193*  --  184* 231*  BUN 14  --  11  --   CREATININE 1.06  --  1.17  --   CALCIUM 8.8*  --  9.0  --   MG  --  1.5*  --   --    Liver Function Tests:  Recent Labs Lab 10/17/14 0211  AST 18  ALT 15*  ALKPHOS 50  BILITOT 1.0  PROT 6.1*  ALBUMIN 3.4*   No results for input(s): LIPASE, AMYLASE in the last 168 hours. No results for input(s): AMMONIA in the last 168 hours. CBC:  Recent Labs Lab 10/16/14 0154 10/17/14 0211  WBC 11.3* 8.9  NEUTROABS 8.6*  --   HGB 15.1 15.1  HCT 43.0 43.8  MCV 97.1 97.6  PLT 143* 142*   Cardiac Enzymes:  Recent Labs Lab 10/16/14 0552  TROPONINI 0.04*   BNP: BNP (last 3 results)  Recent Labs  04/05/14 1400 04/07/14 1027  BNP 502.0* 601.0*    ProBNP (last 3 results) No results for input(s): PROBNP in the last 8760 hours.  CBG:  Recent Labs Lab 10/17/14 0807 10/17/14 1157 10/17/14 2120 10/18/14 0646 10/18/14 1023  GLUCAP 150* 160* 422* 150* 184*       Signed:  Carolyne Littles, MD [facility] [phone] pager

## 2014-10-19 LAB — GLUCOSE, CAPILLARY: GLUCOSE-CAPILLARY: 214 mg/dL — AB (ref 65–99)

## 2014-10-20 ENCOUNTER — Emergency Department (HOSPITAL_COMMUNITY): Payer: Medicare HMO

## 2014-10-20 ENCOUNTER — Encounter (HOSPITAL_COMMUNITY): Payer: Self-pay | Admitting: Emergency Medicine

## 2014-10-20 ENCOUNTER — Observation Stay (HOSPITAL_COMMUNITY)
Admission: EM | Admit: 2014-10-20 | Discharge: 2014-10-22 | Disposition: A | Discharge status: Home / Self Care | Payer: Medicare HMO | Attending: Internal Medicine | Admitting: Internal Medicine

## 2014-10-20 ENCOUNTER — Inpatient Hospital Stay (HOSPITAL_COMMUNITY): Payer: Medicare HMO

## 2014-10-20 DIAGNOSIS — I255 Ischemic cardiomyopathy: Secondary | ICD-10-CM

## 2014-10-20 DIAGNOSIS — S065XAA Traumatic subdural hemorrhage with loss of consciousness status unknown, initial encounter: Secondary | ICD-10-CM

## 2014-10-20 DIAGNOSIS — M1990 Unspecified osteoarthritis, unspecified site: Secondary | ICD-10-CM | POA: Diagnosis not present

## 2014-10-20 DIAGNOSIS — I481 Persistent atrial fibrillation: Secondary | ICD-10-CM | POA: Diagnosis not present

## 2014-10-20 DIAGNOSIS — R200 Anesthesia of skin: Secondary | ICD-10-CM

## 2014-10-20 DIAGNOSIS — E119 Type 2 diabetes mellitus without complications: Secondary | ICD-10-CM | POA: Diagnosis not present

## 2014-10-20 DIAGNOSIS — Z7901 Long term (current) use of anticoagulants: Secondary | ICD-10-CM | POA: Diagnosis not present

## 2014-10-20 DIAGNOSIS — I249 Acute ischemic heart disease, unspecified: Secondary | ICD-10-CM

## 2014-10-20 DIAGNOSIS — W19XXXA Unspecified fall, initial encounter: Secondary | ICD-10-CM

## 2014-10-20 DIAGNOSIS — R569 Unspecified convulsions: Secondary | ICD-10-CM

## 2014-10-20 DIAGNOSIS — G4733 Obstructive sleep apnea (adult) (pediatric): Secondary | ICD-10-CM | POA: Diagnosis not present

## 2014-10-20 DIAGNOSIS — R404 Transient alteration of awareness: Secondary | ICD-10-CM

## 2014-10-20 DIAGNOSIS — R079 Chest pain, unspecified: Secondary | ICD-10-CM | POA: Diagnosis present

## 2014-10-20 DIAGNOSIS — I4891 Unspecified atrial fibrillation: Secondary | ICD-10-CM | POA: Insufficient documentation

## 2014-10-20 DIAGNOSIS — M109 Gout, unspecified: Secondary | ICD-10-CM | POA: Diagnosis not present

## 2014-10-20 DIAGNOSIS — I609 Nontraumatic subarachnoid hemorrhage, unspecified: Secondary | ICD-10-CM

## 2014-10-20 DIAGNOSIS — N200 Calculus of kidney: Secondary | ICD-10-CM | POA: Diagnosis not present

## 2014-10-20 DIAGNOSIS — I509 Heart failure, unspecified: Secondary | ICD-10-CM | POA: Diagnosis not present

## 2014-10-20 DIAGNOSIS — I480 Paroxysmal atrial fibrillation: Secondary | ICD-10-CM

## 2014-10-20 DIAGNOSIS — R7989 Other specified abnormal findings of blood chemistry: Secondary | ICD-10-CM

## 2014-10-20 DIAGNOSIS — E785 Hyperlipidemia, unspecified: Secondary | ICD-10-CM

## 2014-10-20 DIAGNOSIS — I482 Chronic atrial fibrillation: Secondary | ICD-10-CM

## 2014-10-20 DIAGNOSIS — I428 Other cardiomyopathies: Secondary | ICD-10-CM

## 2014-10-20 DIAGNOSIS — Z9861 Coronary angioplasty status: Secondary | ICD-10-CM | POA: Insufficient documentation

## 2014-10-20 DIAGNOSIS — I2510 Atherosclerotic heart disease of native coronary artery without angina pectoris: Secondary | ICD-10-CM | POA: Diagnosis present

## 2014-10-20 DIAGNOSIS — I5022 Chronic systolic (congestive) heart failure: Secondary | ICD-10-CM | POA: Diagnosis present

## 2014-10-20 DIAGNOSIS — I6330 Cerebral infarction due to thrombosis of unspecified cerebral artery: Secondary | ICD-10-CM

## 2014-10-20 DIAGNOSIS — R55 Syncope and collapse: Principal | ICD-10-CM | POA: Diagnosis present

## 2014-10-20 DIAGNOSIS — I252 Old myocardial infarction: Secondary | ICD-10-CM | POA: Insufficient documentation

## 2014-10-20 DIAGNOSIS — S065X9A Traumatic subdural hemorrhage with loss of consciousness of unspecified duration, initial encounter: Secondary | ICD-10-CM

## 2014-10-20 DIAGNOSIS — Z0189 Encounter for other specified special examinations: Secondary | ICD-10-CM

## 2014-10-20 DIAGNOSIS — Z79899 Other long term (current) drug therapy: Secondary | ICD-10-CM | POA: Diagnosis not present

## 2014-10-20 DIAGNOSIS — I4819 Other persistent atrial fibrillation: Secondary | ICD-10-CM

## 2014-10-20 DIAGNOSIS — I4821 Permanent atrial fibrillation: Secondary | ICD-10-CM | POA: Diagnosis present

## 2014-10-20 DIAGNOSIS — Z95810 Presence of automatic (implantable) cardiac defibrillator: Secondary | ICD-10-CM | POA: Diagnosis present

## 2014-10-20 DIAGNOSIS — I5042 Chronic combined systolic (congestive) and diastolic (congestive) heart failure: Secondary | ICD-10-CM | POA: Diagnosis not present

## 2014-10-20 DIAGNOSIS — R42 Dizziness and giddiness: Secondary | ICD-10-CM | POA: Insufficient documentation

## 2014-10-20 DIAGNOSIS — R778 Other specified abnormalities of plasma proteins: Secondary | ICD-10-CM

## 2014-10-20 DIAGNOSIS — I4892 Unspecified atrial flutter: Secondary | ICD-10-CM | POA: Insufficient documentation

## 2014-10-20 DIAGNOSIS — J449 Chronic obstructive pulmonary disease, unspecified: Secondary | ICD-10-CM | POA: Diagnosis present

## 2014-10-20 DIAGNOSIS — E1165 Type 2 diabetes mellitus with hyperglycemia: Secondary | ICD-10-CM

## 2014-10-20 DIAGNOSIS — I429 Cardiomyopathy, unspecified: Secondary | ICD-10-CM

## 2014-10-20 DIAGNOSIS — Z87891 Personal history of nicotine dependence: Secondary | ICD-10-CM | POA: Insufficient documentation

## 2014-10-20 DIAGNOSIS — Z9189 Other specified personal risk factors, not elsewhere classified: Secondary | ICD-10-CM

## 2014-10-20 DIAGNOSIS — R4020 Unspecified coma: Secondary | ICD-10-CM

## 2014-10-20 DIAGNOSIS — E875 Hyperkalemia: Secondary | ICD-10-CM

## 2014-10-20 DIAGNOSIS — N201 Calculus of ureter: Secondary | ICD-10-CM

## 2014-10-20 DIAGNOSIS — IMO0002 Reserved for concepts with insufficient information to code with codable children: Secondary | ICD-10-CM | POA: Diagnosis present

## 2014-10-20 DIAGNOSIS — J438 Other emphysema: Secondary | ICD-10-CM

## 2014-10-20 LAB — GLUCOSE, CAPILLARY: GLUCOSE-CAPILLARY: 181 mg/dL — AB (ref 65–99)

## 2014-10-20 LAB — CBC
HCT: 44.1 % (ref 39.0–52.0)
HEMOGLOBIN: 15.5 g/dL (ref 13.0–17.0)
MCH: 33.8 pg (ref 26.0–34.0)
MCHC: 35.1 g/dL (ref 30.0–36.0)
MCV: 96.3 fL (ref 78.0–100.0)
PLATELETS: 151 10*3/uL (ref 150–400)
RBC: 4.58 MIL/uL (ref 4.22–5.81)
RDW: 13.6 % (ref 11.5–15.5)
WBC: 9.5 10*3/uL (ref 4.0–10.5)

## 2014-10-20 LAB — PROTIME-INR
INR: 1.1 (ref 0.00–1.49)
Prothrombin Time: 14.4 seconds (ref 11.6–15.2)

## 2014-10-20 LAB — BASIC METABOLIC PANEL
Anion gap: 10 (ref 5–15)
BUN: 13 mg/dL (ref 6–20)
CO2: 28 mmol/L (ref 22–32)
Calcium: 9.4 mg/dL (ref 8.9–10.3)
Chloride: 100 mmol/L — ABNORMAL LOW (ref 101–111)
Creatinine, Ser: 1.04 mg/dL (ref 0.61–1.24)
GFR calc Af Amer: >60 mL/min (ref >60)
GFR calc non Af Amer: >60 mL/min (ref >60)
Glucose, Bld: 226 mg/dL — ABNORMAL HIGH (ref 65–99)
POTASSIUM: 3.5 mmol/L (ref 3.5–5.1)
Sodium: 138 mmol/L (ref 135–145)

## 2014-10-20 LAB — CBG MONITORING, ED: Glucose-Capillary: 191 mg/dL — ABNORMAL HIGH (ref 65–99)

## 2014-10-20 LAB — MAGNESIUM: Magnesium: 1.7 mg/dL (ref 1.7–2.4)

## 2014-10-20 LAB — I-STAT TROPONIN, ED: Troponin i, poc: 0.05 ng/mL (ref 0.00–0.08)

## 2014-10-20 MED ORDER — ENOXAPARIN SODIUM 40 MG/0.4ML ~~LOC~~ SOLN
40.0000 mg | SUBCUTANEOUS | Status: DC
  Administered 2014-10-20: 40 mg via SUBCUTANEOUS
  Filled 2014-10-20: qty 0.4

## 2014-10-20 MED ORDER — MAGNESIUM SULFATE 2 GM/50ML IV SOLN
2.0000 g | Freq: Once | INTRAVENOUS | Status: AC
  Administered 2014-10-20: 2 g via INTRAVENOUS
  Filled 2014-10-20: qty 50

## 2014-10-20 MED ORDER — DIGOXIN 250 MCG PO TABS
0.2500 mg | ORAL_TABLET | Freq: Every day | ORAL | Status: DC
  Administered 2014-10-20 – 2014-10-22 (×3): 0.25 mg via ORAL
  Filled 2014-10-20 (×3): qty 1

## 2014-10-20 MED ORDER — INSULIN ASPART 100 UNIT/ML ~~LOC~~ SOLN
3.0000 [IU] | Freq: Three times a day (TID) | SUBCUTANEOUS | Status: DC
  Administered 2014-10-20 – 2014-10-22 (×6): 3 [IU] via SUBCUTANEOUS

## 2014-10-20 MED ORDER — OMEGA-3-ACID ETHYL ESTERS 1 G PO CAPS
1.0000 g | ORAL_CAPSULE | Freq: Two times a day (BID) | ORAL | Status: DC
  Administered 2014-10-20 – 2014-10-22 (×5): 1 g via ORAL
  Filled 2014-10-20 (×6): qty 1

## 2014-10-20 MED ORDER — LEVETIRACETAM 500 MG PO TABS
500.0000 mg | ORAL_TABLET | Freq: Two times a day (BID) | ORAL | Status: DC
Start: 2014-10-20 — End: 2014-10-22
  Administered 2014-10-20 – 2014-10-21 (×3): 500 mg via ORAL
  Filled 2014-10-20 (×3): qty 1

## 2014-10-20 MED ORDER — CARVEDILOL 12.5 MG PO TABS: 12.5000 mg | ORAL_TABLET | Freq: Two times a day (BID) | ORAL | Status: DC

## 2014-10-20 MED ORDER — FUROSEMIDE 40 MG PO TABS
40.0000 mg | ORAL_TABLET | Freq: Every day | ORAL | Status: DC
  Administered 2014-10-20 – 2014-10-21 (×2): 40 mg via ORAL
  Filled 2014-10-20 (×2): qty 1

## 2014-10-20 MED ORDER — ONDANSETRON HCL 4 MG PO TABS: 4.0000 mg | ORAL_TABLET | Freq: Four times a day (QID) | ORAL | Status: DC | PRN

## 2014-10-20 MED ORDER — ONDANSETRON HCL 4 MG/2ML IJ SOLN
4.0000 mg | Freq: Four times a day (QID) | INTRAMUSCULAR | Status: DC | PRN
Start: 2014-10-20 — End: 2014-10-22

## 2014-10-20 MED ORDER — INSULIN ASPART 100 UNIT/ML ~~LOC~~ SOLN
0.0000 [IU] | Freq: Three times a day (TID) | SUBCUTANEOUS | Status: DC
  Administered 2014-10-20 – 2014-10-21 (×2): 2 [IU] via SUBCUTANEOUS
  Administered 2014-10-21: 1 [IU] via SUBCUTANEOUS
  Administered 2014-10-22: 3 [IU] via SUBCUTANEOUS
  Administered 2014-10-22: 1 [IU] via SUBCUTANEOUS

## 2014-10-20 MED ORDER — ACETAMINOPHEN 325 MG PO TABS
650.0000 mg | ORAL_TABLET | Freq: Once | ORAL | Status: AC
  Administered 2014-10-20: 650 mg via ORAL
  Filled 2014-10-20: qty 2

## 2014-10-20 MED ORDER — SODIUM CHLORIDE 0.9 % IV SOLN: INTRAVENOUS | Status: AC

## 2014-10-20 MED ORDER — PRAVASTATIN SODIUM 40 MG PO TABS
40.0000 mg | ORAL_TABLET | Freq: Every day | ORAL | Status: DC
  Administered 2014-10-20 – 2014-10-22 (×3): 40 mg via ORAL
  Filled 2014-10-20 (×3): qty 1

## 2014-10-20 MED ORDER — SENNOSIDES-DOCUSATE SODIUM 8.6-50 MG PO TABS: 1.0000 | ORAL_TABLET | Freq: Every evening | ORAL | Status: DC | PRN

## 2014-10-20 MED ORDER — MAGNESIUM OXIDE 400 MG PO TABS
400.0000 mg | ORAL_TABLET | Freq: Two times a day (BID) | ORAL | Status: DC
  Administered 2014-10-20 – 2014-10-22 (×5): 400 mg via ORAL
  Filled 2014-10-20 (×10): qty 1

## 2014-10-20 MED ORDER — INSULIN GLARGINE 100 UNIT/ML ~~LOC~~ SOLN
10.0000 [IU] | Freq: Every day | SUBCUTANEOUS | Status: DC
  Administered 2014-10-20 – 2014-10-21 (×2): 10 [IU] via SUBCUTANEOUS
  Filled 2014-10-20 (×3): qty 0.1

## 2014-10-20 MED ORDER — SODIUM CHLORIDE 0.9 % IV BOLUS (SEPSIS)
250.0000 mL | Freq: Once | INTRAVENOUS | Status: AC
  Administered 2014-10-20: 250 mL via INTRAVENOUS

## 2014-10-20 MED ORDER — SODIUM CHLORIDE 0.9 % IV SOLN
INTRAVENOUS | Status: AC
  Administered 2014-10-20: 19:00:00 via INTRAVENOUS

## 2014-10-20 MED ORDER — ENSURE ENLIVE PO LIQD
237.0000 mL | Freq: Two times a day (BID) | ORAL | Status: DC
  Administered 2014-10-21 – 2014-10-22 (×2): 237 mL via ORAL

## 2014-10-20 MED ORDER — LEVETIRACETAM 500 MG PO TABS: 500.0000 mg | ORAL_TABLET | Freq: Two times a day (BID) | ORAL | Status: DC

## 2014-10-20 MED ORDER — FISH OIL BURP-LESS 1000 MG PO CAPS: 1200.0000 mg | ORAL_CAPSULE | Freq: Two times a day (BID) | ORAL | Status: DC

## 2014-10-20 MED ORDER — SODIUM CHLORIDE 0.9 % IJ SOLN
3.0000 mL | Freq: Two times a day (BID) | INTRAMUSCULAR | Status: DC
  Administered 2014-10-20 – 2014-10-22 (×4): 3 mL via INTRAVENOUS

## 2014-10-20 MED FILL — Hydrocodone-Acetaminophen Tab 5-325 MG: ORAL | Qty: 6 | Status: AC

## 2014-10-20 NOTE — Progress Notes (Addendum)
80 y/o ? known history of ischemic cardiomyopathy status post PPM 2014,CAD 1998 status post LAD, RCA stenting 2004, prior subarachnoid hemorrhage sometime in 2014 he had an ICD placed in May 2014, OSA on CPAP, has a history of chronic atrial fibrillation ([place] score above) 5)and was on Coumadin until recent hospital stay7/10-7/12 2016 where in he was admitted with syncopy, incoordination and dysarthria in a setting of posttraumatic frontal SDH and neurosurgery saw the patient and did not recommend any type of surgery. complete workup performed--EEG at that time was normal however carotid studies were pending  represented as per above note by Mrs. York with 2 episodes of transient blacking out where he says he just blacked out and had assisted fall to the floor. - Chest pain, - nausea - cough, - fever, - chills, - diarrhea, - other systemic illness as per above  labs as above are entirely normal PPM interrogatedby [employer] representative-see note from Dr. Mayford Knife Neurology has been consulted as well-might require formal 24-hour EEG as EEG last time was noncontributory On exam do not appreciate any carotid bruit but as this has not been completed as part of his stroke workup we will get this   monitor closely on progressive telemetry with low threshold for intervention by cardiology-I discussed personally with Dr. Mayford Knife and she recommends holding Coreg for now we will also get a magnesium and can continue digoxin. This is a narrow therapeutic index drug and need to be careful using this for rate control-I will get a dig level added onto his meds and labs.  anticipate  least 2 days inpatient stay  Rest as per note as per Ms Marcelline Mates, MD [facility] [phone]

## 2014-10-20 NOTE — Procedures (Signed)
ELECTROENCEPHALOGRAM REPORT   Patient: [name]      Room #: 1O-10 Age: 80 y.o.        Sex: male Referring Physician: Dr Mahala Menghini Report Date:  10/20/2014        Interpreting Physician: Omelia Blackwater  History: [name] is an 80 y.o. male hx of bilateral SDH, A fib admitted with recurrent episodes of abnormal behavior concerning for possible seizure activity  Medications:  Scheduled: . sodium chloride   Intravenous STAT  . digoxin  0.25 mg Oral Daily  . enoxaparin (LOVENOX) injection  40 mg Subcutaneous Q24H  . furosemide  40 mg Oral Daily  . insulin aspart  0-9 Units Subcutaneous TID WC  . insulin aspart  3 Units Subcutaneous TID WC  . insulin glargine  10 Units Subcutaneous QHS  . magnesium oxide  400 mg Oral BID  . omega-3 acid ethyl esters  1 g Oral BID  . pravastatin  40 mg Oral Daily  . sodium chloride  3 mL Intravenous Q12H    Conditions of Recording:  This is a 18 channel EEG carried out with the patient in the drowsy state.  Description:  The waking background activity consists of a low voltage, symmetrical, fairly well organized, 8-10 Hz alpha activity, seen from the parieto-occipital and posterior temporal regions. Intermittent posterior theta activity is noted.  No focal slowing or epileptiform activity is noted. Normal sleep architecture is not observed.   Hyperventilation was not performed.Intermittent photic stimulation was performed but failed to illicit any change in the tracing.    IMPRESSION: Normal electroencephalogram, awake, asleep and with activation procedures. There are no focal lateralizing or epileptiform features.   Elspeth Cho, DO [facility] [phone]  If 7pm- 7am, please page neurology on call as listed in AMION. 10/20/2014, 5:01 PM

## 2014-10-20 NOTE — ED Provider Notes (Signed)
CSN: [id number]     Arrival date & time 10/20/14  0129 History  This chart was scribed for  [name] Baton, MD by [name], ED Scribe. This patient was seen in room B19C/B19C and the patient's care was started at 1:58 AM.    Chief Complaint  Patient presents with  . Seizures    The history is provided by the patient. No language interpreter was used.   [name] is a 80 y.o. male with an ICD and PMHx of MI, CHF, a-fib, atrial flutter, and HLD who presents to the Emergency Department complaining of seizure-like activity tonight just PTA. The pt was watching television when he got up to get ice and his hand went numb. After that he knew that he was "going out" so he sat down on the kitchen floor. When he woke up EMS was present. He notes that after the event he was oriented and wanted to get off of the floor. Associated symptoms include an abrasion to the right hand. Pt notes that he is sore in his groin from sitting on the floor but denies other pain. He has not taken warfarin since being discharged from the hospital 2 days ago where he was treated for a SDH after falling.   Chart review:  Patient seen 6/30 with episode of LOC - w/u neg including ICD interrogation.  Seen on 7/10 found to have small SDHs.  No intervention.  Coumadin stopped.  EEG normal.  Follow-up with neurology as an outpatient.   Past Medical History  Diagnosis Date  . Arteriosclerotic cardiovascular disease (ASCVD)   . COPD (chronic obstructive pulmonary disease)   . Gout   . DJD (degenerative joint disease)   . Hyperlipidemia   . Chronic anticoagulation 2012    2012  . CHF (congestive heart failure)   . Atrial fibrillation     Onset in 2012  . Atrial flutter   . ICD (implantable cardiac defibrillator) in place   . Myocardial infarction 1998  . Ischemic cardiomyopathy   . Obstructive sleep apnea     "went away when I lost a bunch of weight" (08/17/2012)  . Type II diabetes mellitus   . Kidney stone      "just once" (08/17/2012)  . NICM (nonischemic cardiomyopathy), EF 25-30% 15-Sep-2012  . At risk for sudden cardiac death 09-15-2012  . Permanent atrial fibrillation 02/15/2011    Initial onset in 03/2011 with rapid ventricular response   . S/P ICD (internal cardiac defibrillator) procedure, 08/17/12, [employer] Sep 15, 2012    [employer]   Past Surgical History  Procedure Laterality Date  . Cholecystectomy  2009  . Knee arthroplasty Left 1978    "tendon & cartilege repair" (08/17/2012)  . Vasectomy  ~ 1964  . Cystoscopy/retrograde/ureteroscopy  06/28/2011    Procedure: CYSTOSCOPY/RETROGRADE/URETEROSCOPY;  Surgeon: Ky Barban, MD;  Location: [facility];  Service: Urology;  Laterality: Right;  . Stone extraction with basket  06/28/2011    Procedure: STONE EXTRACTION WITH BASKET;  Surgeon: Ky Barban, MD;  Location: [facility];  Service: Urology;  Laterality: Right;  specimen given to family per MD  . Cardiac defibrillator placement  08/17/2012    [employer]  . Coronary angioplasty with stent placement  07/14/1996    "1" (08/17/2012)  . Cataract extraction w/ intraocular lens  implant, bilateral Bilateral ~ 2011  . US echocardiography  07/08/2012    EF <20%,mild MR,TR,LA severely dilated  . Myoview perfusion scan  06/02/2012  low risk, extensive scar entire LAD & RCA territory  . S/p icd  08/2012    [employer]  . Implantable cardioverter defibrillator implant N/A 08/17/2012    Procedure: IMPLANTABLE CARDIOVERTER DEFIBRILLATOR IMPLANT;  Surgeon: Thurmon Fair, MD;  Location: [facility];  Service: Cardiovascular;  Laterality: N/A;  . Cardiac catheterization  02/2003   Family History  Problem Relation Age of Onset  . Heart failure Mother   . Heart failure Father    History  Substance Use Topics  . Smoking status: Former Smoker -- 2.00 packs/day for 40 years    Types: Cigarettes    Quit date: 04/08/1992  . Smokeless tobacco: Former Neurosurgeon  . Alcohol Use: No     Comment:  08/17/2012 "quit drinking in 1983"    Review of Systems  Constitutional: Negative.  Negative for fever.  Respiratory: Negative.  Negative for chest tightness and shortness of breath.   Cardiovascular: Negative.  Negative for chest pain.  Gastrointestinal: Negative.  Negative for abdominal pain.  Genitourinary: Negative.  Negative for dysuria.  Musculoskeletal: Negative for back pain.       Right groin pain  Skin: Positive for wound.       Abrasion right hand  Neurological: Positive for syncope and light-headedness. Negative for weakness, numbness and headaches.  All other systems reviewed and are negative.     Allergies  Lipitor; Percocet; Procaine hcl; Tramadol; and Vicodin  Home Medications   Prior to Admission medications   Medication Sig Start Date End Date Taking? Authorizing Provider  carvedilol (COREG) 12.5 MG tablet Take 12.5 mg by mouth 2 (two) times daily with a meal.   Yes Historical Provider, MD  digoxin (LANOXIN) 0.25 MG tablet Take 1 tablet (0.25 mg total) by mouth daily. 02/28/12 01/26/15 Yes Freeman Caldron, PA-C  furosemide (LASIX) 40 MG tablet Take 1 tablet (40 mg total) by mouth daily. 07/12/13  Yes Lennette Bihari, MD  metFORMIN (GLUCOPHAGE) 1000 MG tablet Take 1 tablet (1,000 mg total) by mouth 2 (two) times daily with a meal. 10/18/14  Yes Drema Dallas, MD  pravastatin (PRAVACHOL) 40 MG tablet Take 40 mg by mouth daily.   Yes Historical Provider, MD  HYDROcodone-acetaminophen (NORCO/VICODIN) 5-325 MG per tablet Take 2 tablets by mouth every 4 (four) hours as needed for moderate pain. 10/06/14   [name], MD  magnesium oxide (MAG-OX) 400 MG tablet Take 400 mg by mouth 2 (two) times daily.      Historical Provider, MD  Omega-3 Fatty Acids (FISH OIL BURP-LESS) 1000 MG CAPS Take 1,200 mg by mouth 2 (two) times daily.     Historical Provider, MD   BP 105/71 mmHg  Pulse 80  Temp(Src) 97.9 F (36.6 C) (Oral)  Resp 20  Ht 5\' 5"  (1.651 m)  Wt 165 lb  (74.844 kg)  BMI 27.46 kg/m2  SpO2 99% Physical Exam  Constitutional: He is oriented to person, place, and time. He appears well-developed.  HENT:  Head: Normocephalic and atraumatic.  Mouth/Throat: Oropharynx is clear and moist.  Eyes: EOM are normal. Pupils are equal, round, and reactive to light.  Neck: Normal range of motion. Neck supple.  Cardiovascular: Normal rate and normal heart sounds.   No murmur heard. Irregular rhythm  Pulmonary/Chest: Effort normal and breath sounds normal. No respiratory distress. He has no wheezes.  Abdominal: Soft. Bowel sounds are normal. There is no tenderness. There is no rebound.  Musculoskeletal: He exhibits no edema.  NOrmal ROM of the  bilateral hips and knees, no deformities  Lymphadenopathy:    He has no cervical adenopathy.  Neurological: He is alert and oriented to person, place, and time.  CN 2-12 intact, 5/5 strength all 4 extremities, no dysmetria to FNF  Skin: Skin is warm and dry.  Abrasion as above  Psychiatric:  Strange affect  Nursing note and vitals reviewed.   ED Course  Procedures   CRITICAL CARE Performed by: [name]   Total critical care time: 35 min  Critical care time was exclusive of separately billable procedures and treating other patients.  Critical care was necessary to treat or prevent imminent or life-threatening deterioration.  Critical care was time spent personally by me on the following activities: development of treatment plan with patient and/or surrogate as well as nursing, discussions with consultants, evaluation of patient's response to treatment, examination of patient, obtaining history from patient or surrogate, ordering and performing treatments and interventions, ordering and review of laboratory studies, ordering and review of radiographic studies, pulse oximetry and re-evaluation of patient's condition.   DIAGNOSTIC STUDIES: Oxygen Saturation is 99% on RA, normal by my  interpretation.    COORDINATION OF CARE: 2:06 AM Discussed treatment plan which includes EKG and lab work with pt at bedside and pt agreed to plan.  Labs Review Labs Reviewed  BASIC METABOLIC PANEL - Abnormal; Notable for the following:    Chloride 100 (*)    Glucose, Bld 226 (*)    All other components within normal limits  CBG MONITORING, ED - Abnormal; Notable for the following:    Glucose-Capillary 191 (*)    All other components within normal limits  CBC  PROTIME-INR  I-STAT TROPOININ, ED    Imaging Review Ct Head Wo Contrast  10/20/2014   CLINICAL DATA:  Come avulsion at home today. No prior history of seizures. Patient has skull fracture and head bleed after a fall on 10/08/2014.  EXAM: CT HEAD WITHOUT CONTRAST  TECHNIQUE: Contiguous axial images were obtained from the base of the skull through the vertex without intravenous contrast.  COMPARISON:  10/17/2014  FINDINGS: Left frontotemporal parietal subdural hematoma with subacute and chronic features. Maximal depth measures about 7 mm. No associated mass effect or midline shift. Subacute appearing right temporal subdural hematoma measuring about 3 mm depth. No associated mass effect or midline shift.  Diffuse atrophy. No ventricular dilatation. Gray-white matter junctions are distinct. No intraparenchymal or ventricular hemorrhage. Basal cisterns are not effaced. Vascular calcifications. No depressed skull fractures. Visualized paranasal sinuses and left mastoid air cells are not opacified. Opacification of some of the right at mastoid air cells. No significant change since previous study.  IMPRESSION: Unchanged appearance of small bilateral subdural hematomas. No evidence of any progression or rebleed. Diffuse atrophy and small vessel ischemic changes.   Electronically Signed   By: Burman Nieves M.D.   On: 10/20/2014 03:21   Dg Hip Unilat With Pelvis 2-3 Views Right  10/20/2014   CLINICAL DATA:  Right hip and groin pain today after  a fall tonight.  EXAM: DG HIP (WITH OR WITHOUT PELVIS) 2-3V RIGHT  COMPARISON:  None.  FINDINGS: There is no evidence of hip fracture or dislocation. There is no evidence of arthropathy or other focal bone abnormality. Vascular calcifications.  IMPRESSION: Negative.   Electronically Signed   By: Burman Nieves M.D.   On: 10/20/2014 05:23     EKG Interpretation   Date/Time:  Thursday October 20 2014 01:44:27 EDT Ventricular Rate:  101  PR Interval:    QRS Duration: 141 QT Interval:  342 QTC Calculation: 402 R Axis:   -28 Text Interpretation:  Atrial fibrillation Left bundle branch block  Confirmed by Kreed Kauffman  MD, Amyre Segundo ([id number]) on 10/20/2014 3:34:20 AM      MDM   Final diagnoses:  Loss of consciousness  Persistent atrial fibrillation   Patient presents with recurrent LOC.  NO reported seizure activity.  Recent admission for TBI and EEG was normal.  Denies CP or SOB.  Nontoxic.  VS reassuring.  W/u including labs, trop. CT head and pelvis reassuring.  Unchanged SDHs.    Discussed w/u with patient.  WIll have [employer] interrogate ICD.  Patient has remained stable in ED.  Per BS rep, Barbara Cower, patient had a run of 15 s of atrial fibrillation w RVR 195-205 at approximately the time of his episode of LOC.  No prior recorded events since Feb (including 7/10 and 6/30).  However, his device is not set to record or respond to rates <200.  Discussed with Dr. Mayford Knife who will discuss with EP regarding adjustment of device and follow-up.  Patient due to follow with Dr. Gerilyn Pilgrim regarding further w/u for seizure vs TIA.  DOes not wish to do so.  Would like to see someone at [facility].  Will give this follow-up information.  Family was updated multiple times.  They do not seem to fully understand what is going on.  Questions were readdressed and clarified multiple times.  I personally performed the services described in this documentation, which was scribed in my presence. The recorded  information has been reviewed and is accurate.      [name] Baton, MD 10/20/14 1101

## 2014-10-20 NOTE — ED Notes (Signed)
MD at bedside. 

## 2014-10-20 NOTE — ED Provider Notes (Signed)
1:09 PM cards has seen, hospitalist to admit.   Clinical Impression 1. Loss of consciousness   2. Persistent atrial fibrillation      Purvis Sheffield, MD 10/20/14 [phone]

## 2014-10-20 NOTE — Consult Note (Signed)
Patient ID: [name] MRN: [id number], DOB/AGE: 12-10-1934   Admit date: 10/20/2014   Primary Physician: [name], MD Primary Cardiologist: Dr. Kelly/Dr. Croitoru  Pt. Profile:  80 y/o male with CAD, ICM w/ EF of 20-25%, s/p ICD, chronic atrial fibrillation, HTN, HLD, DM and recent admission for seizures/ syncope and collapse, presenting back to ED with recurrent syncope.   Problem List  Past Medical History  Diagnosis Date  . Arteriosclerotic cardiovascular disease (ASCVD)   . COPD (chronic obstructive pulmonary disease)   . Gout   . DJD (degenerative joint disease)   . Hyperlipidemia   . Chronic anticoagulation 2012    2012  . CHF (congestive heart failure)   . Atrial fibrillation     Onset in 2012  . Atrial flutter   . ICD (implantable cardiac defibrillator) in place   . Myocardial infarction 1998  . Ischemic cardiomyopathy   . Obstructive sleep apnea     "went away when I lost a bunch of weight" (08/17/2012)  . Type II diabetes mellitus   . Kidney stone     "just once" (08/17/2012)  . NICM (nonischemic cardiomyopathy), EF 25-30% Aug 27, 2012  . At risk for sudden cardiac death 08-27-2012  . Permanent atrial fibrillation 02/15/2011    Initial onset in 03/2011 with rapid ventricular response   . S/P ICD (internal cardiac defibrillator) procedure, 08/17/12, [employer] 08/27/12    [employer]    Past Surgical History  Procedure Laterality Date  . Cholecystectomy  2009  . Knee arthroplasty Left 1978    "tendon & cartilege repair" (08/17/2012)  . Vasectomy  ~ 1964  . Cystoscopy/retrograde/ureteroscopy  06/28/2011    Procedure: CYSTOSCOPY/RETROGRADE/URETEROSCOPY;  Surgeon: Ky Barban, MD;  Location: [facility];  Service: Urology;  Laterality: Right;  . Stone extraction with basket  06/28/2011    Procedure: STONE EXTRACTION WITH BASKET;  Surgeon: Ky Barban, MD;  Location: [facility];  Service: Urology;  Laterality: Right;  specimen given to  family per MD  . Cardiac defibrillator placement  08/17/2012    [employer]  . Coronary angioplasty with stent placement  07/14/1996    "1" (08/17/2012)  . Cataract extraction w/ intraocular lens  implant, bilateral Bilateral ~ 2011  . US echocardiography  07/08/2012    EF <20%,mild MR,TR,LA severely dilated  . Myoview perfusion scan  06/02/2012    low risk, extensive scar entire LAD & RCA territory  . S/p icd  08/2012    [employer]  . Implantable cardioverter defibrillator implant N/A 08/17/2012    Procedure: IMPLANTABLE CARDIOVERTER DEFIBRILLATOR IMPLANT;  Surgeon: Thurmon Fair, MD;  Location: [facility];  Service: Cardiovascular;  Laterality: N/A;  . Cardiac catheterization  02/2003     Allergies  Allergies  Allergen Reactions  . Lipitor [Atorvastatin] Other (See Comments)    myalgias   . Percocet [Oxycodone-Acetaminophen] Nausea And Vomiting    Needs to drink milk and take a Zofran  . Procaine Hcl Nausea And Vomiting  . Tramadol Nausea And Vomiting  . Vicodin [Hydrocodone-Acetaminophen] Nausea And Vomiting    HPI  80 y/o male, followed by [name], with h/o CAD s/p anterior MI in 1998 and multiple interventions to his LAD and RCA,  ischemic cardiomyopathy w/ EF of 20-25% s/p ICD followed by Dr. Thayer Ohm, chronic atrial fibrillation on chronic anticoagulation, HTN, HDL, DM and OSA on CPAP therapy. Over the last several weeks, he has had multiple episodes of syncope and collapse/  seizure. He was  recently admitted for this several days ago from 7/10 -7/12. CT scan of his head 7/10 showed 2 minute subdural hematomas. Per notes, Neurosurgery did not feel these findings warranted any intervention. EEG was normal. His EKGs showed chronic LBBB and 2D echo showed no significant changes compared to prior studies. He was discharged home and instructed to discontinue his Coumadin and start Eliquis in 1 week. It was also recommended that he be evaluated with outpatient carotid dopplers (not  yet obtained).  He presented back to the [facility] ED earlier today with recurrent symptoms. He is a very poor historian and is wife provides most of the details. She notes that he was in his usual state of health until last PM when she witnessed another episode of syncope and collapse. He got up from the sofa and suddenly fell over on to a side table. There was seizure like activity. His wife does not recall of him complaining of any symptoms prior to the event. His wife reports that he was unresponsive for a brief period of time and she called 911 and he was transported to the ED. Repeat head CT is unchanged in appearance compared to scan on 7/10. No evidence of any progression or rebleed. Interrogation of his ICD revealed 15 secs of a rapid rhythm during the time of his episode. No therapy was delivered and tachyarrythmia self terminated. His detection rate is set to > 200 bpm. It is unsure if this was rapid atrial fibrillation vs slow VT. On arrival to the ED, he was in atrial fibrillation with a CVR. CBC and BMP unremarkable.   Home Medications  Prior to Admission medications   Medication Sig Start Date End Date Taking? Authorizing Provider  carvedilol (COREG) 12.5 MG tablet Take 12.5 mg by mouth 2 (two) times daily with a meal.   Yes Historical Provider, MD  digoxin (LANOXIN) 0.25 MG tablet Take 1 tablet (0.25 mg total) by mouth daily. 02/28/12 01/26/15 Yes Freeman Caldron, PA-C  furosemide (LASIX) 40 MG tablet Take 1 tablet (40 mg total) by mouth daily. 07/12/13  Yes Lennette Bihari, MD  metFORMIN (GLUCOPHAGE) 1000 MG tablet Take 1 tablet (1,000 mg total) by mouth 2 (two) times daily with a meal. 10/18/14  Yes Drema Dallas, MD  pravastatin (PRAVACHOL) 40 MG tablet Take 40 mg by mouth daily.   Yes Historical Provider, MD  HYDROcodone-acetaminophen (NORCO/VICODIN) 5-325 MG per tablet Take 2 tablets by mouth every 4 (four) hours as needed for moderate pain. 10/06/14   [name], MD  magnesium  oxide (MAG-OX) 400 MG tablet Take 400 mg by mouth 2 (two) times daily.      Historical Provider, MD  Omega-3 Fatty Acids (FISH OIL BURP-LESS) 1000 MG CAPS Take 1,200 mg by mouth 2 (two) times daily.     Historical Provider, MD    Family History  Family History  Problem Relation Age of Onset  . Heart failure Mother   . Heart failure Father     Social History  History   Social History  . Marital Status: Married    Spouse Name: N/A  . Number of Children: N/A  . Years of Education: N/A   Occupational History  . Not on file.   Social History Main Topics  . Smoking status: Former Smoker -- 2.00 packs/day for 40 years    Types: Cigarettes    Quit date: 04/08/1992  . Smokeless tobacco: Former Neurosurgeon  . Alcohol Use: No  Comment: 08/17/2012 "quit drinking in 1983"  . Drug Use: No  . Sexual Activity: No   Other Topics Concern  . Not on file   Social History Narrative     Review of Systems General:  No chills, fever, night sweats or weight changes.  Cardiovascular:  No chest pain, dyspnea on exertion, edema, orthopnea, palpitations, paroxysmal nocturnal dyspnea. Dermatological: No rash, lesions/masses Respiratory: No cough, dyspnea Urologic: No hematuria, dysuria Abdominal:   No nausea, vomiting, diarrhea, bright red blood per rectum, melena, or hematemesis Neurologic:  No visual changes, wkns, changes in mental status. All other systems reviewed and are otherwise negative except as noted above.  Physical Exam  Blood pressure 96/47, pulse 52, temperature 97.9 F (36.6 C), temperature source Oral, resp. rate 13, height 5\' 5"  (1.651 m), weight 165 lb (74.844 kg), SpO2 97 %.  General: Pleasant, NAD Psych: Normal affect. Neuro: Alert and oriented X 3. Moves all extremities spontaneously. HEENT: Normal  Neck: Supple without bruits or JVD. Lungs:  Resp regular and unlabored, CTA. Heart: irregularly irregular, 1/6 SM  Abdomen: Soft, non-tender, non-distended, BS + x 4.    Extremities: No clubbing, cyanosis or edema. DP/PT/Radials 2+ and equal bilaterally.  Labs  Troponin [facility] of Care Test)  Recent Labs  10/20/14 0217  TROPIPOC 0.05   No results for input(s): CKTOTAL, CKMB, TROPONINI in the last 72 hours. Lab Results  Component Value Date   WBC 9.5 10/20/2014   HGB 15.5 10/20/2014   HCT 44.1 10/20/2014   MCV 96.3 10/20/2014   PLT 151 10/20/2014    Recent Labs Lab 10/17/14 0211  10/20/14 0213  NA 140  --  138  K 3.5  --  3.5  CL 99*  --  100*  CO2 32  --  28  BUN 11  --  13  CREATININE 1.17  --  1.04  CALCIUM 9.0  --  9.4  PROT 6.1*  --   --   BILITOT 1.0  --   --   ALKPHOS 50  --   --   ALT 15*  --   --   AST 18  --   --   GLUCOSE 184*  < > 226*  < > = values in this interval not displayed. Lab Results  Component Value Date   CHOL 141 10/16/2014   HDL 25* 10/16/2014   LDLCALC UNABLE TO CALCULATE IF TRIGLYCERIDE OVER 400 mg/dL 54/12/8117   TRIG 147* 10/16/2014   Lab Results  Component Value Date   DDIMER 0.75* 03/25/2012     Radiology/Studies  Dg Chest 2 View  10/16/2014   CLINICAL DATA:  Altered mental status.  Syncope  EXAM: CHEST  2 VIEW  COMPARISON:  10/06/2014  FINDINGS: EKG leads causes significant artifact as they loop across the chest.  Stable positioning of single chamber right ventricular ICD/pacer lead.  Borderline cardiomegaly is stable. Stable, negative aortic and hilar contours. There is no edema, consolidation, effusion, or pneumothorax. Cholecystectomy clips.  IMPRESSION: No active cardiopulmonary disease.   Electronically Signed   By: Marnee Spring M.D.   On: 10/16/2014 05:42   Dg Chest 2 View  10/06/2014   CLINICAL DATA:  Mid upper back pain after falling out of a chair in the lawn this evening.  EXAM: CHEST  2 VIEW  COMPARISON:  04/05/2014  FINDINGS: There is mild unchanged cardiomegaly. The transvenous lead appears intact. There is no pneumothorax. Mediastinal contours are normal and unchanged. The lungs  are clear. There  is no effusion. There is an acute minimally displaced fracture at the lateral aspect of the left fifth rib. Portions of the left second through sixth ribs are obscured by the cardiac pacing device.  IMPRESSION: Minimally displaced fracture at the lateral aspect of the left fifth rib.   Electronically Signed   By: Ellery Plunk M.D.   On: 10/06/2014 21:44   Dg Thoracic Spine 2 View  10/06/2014   CLINICAL DATA:  Mid back pain following falling from chair, initial encounter  EXAM: THORACIC SPINE - 2-3 VIEWS  COMPARISON:  04/05/2014  FINDINGS: Multilevel osteophytic changes are seen. No compression deformities are noted. No paraspinal mass lesion is seen. The visualized rib cage is within normal limits. A pacing device is again seen and stable.  IMPRESSION: No acute abnormality noted.  Multilevel degenerative change is seen.   Electronically Signed   By: Alcide Clever M.D.   On: 10/06/2014 21:47   Ct Head Wo Contrast  10/20/2014   CLINICAL DATA:  Come avulsion at home today. No prior history of seizures. Patient has skull fracture and head bleed after a fall on 10/08/2014.  EXAM: CT HEAD WITHOUT CONTRAST  TECHNIQUE: Contiguous axial images were obtained from the base of the skull through the vertex without intravenous contrast.  COMPARISON:  10/17/2014  FINDINGS: Left frontotemporal parietal subdural hematoma with subacute and chronic features. Maximal depth measures about 7 mm. No associated mass effect or midline shift. Subacute appearing right temporal subdural hematoma measuring about 3 mm depth. No associated mass effect or midline shift.  Diffuse atrophy. No ventricular dilatation. Gray-white matter junctions are distinct. No intraparenchymal or ventricular hemorrhage. Basal cisterns are not effaced. Vascular calcifications. No depressed skull fractures. Visualized paranasal sinuses and left mastoid air cells are not opacified. Opacification of some of the right at mastoid air cells. No  significant change since previous study.  IMPRESSION: Unchanged appearance of small bilateral subdural hematomas. No evidence of any progression or rebleed. Diffuse atrophy and small vessel ischemic changes.   Electronically Signed   By: Burman Nieves M.D.   On: 10/20/2014 03:21   Ct Head Wo Contrast  10/17/2014   CLINICAL DATA:  Followup subdural hematoma/subarachnoid hemorrhage. Altered mental status with seizure-like activity.  EXAM: CT HEAD WITHOUT CONTRAST  TECHNIQUE: Contiguous axial images were obtained from the base of the skull through the vertex without intravenous contrast.  COMPARISON:  CT head Aug 16, 2014  FINDINGS: LEFT holo hemispheric acute on chronic subdural hematoma. Intermediate data hypodense RIGHT holo hemispheric 4 mm subdural hematoma. No midline shift. No intraparenchymal hemorrhage. No hydrocephalus. No acute large vascular territory infarct. Basal cisterns are patent. Moderate calcific atherosclerosis of the carotid siphons and to lesser extent included vertebral arteries.  Status post bilateral ocular lens implants. Paranasal sinuses and mastoid air cells are well aerated. No skull fracture.  IMPRESSION: Evolving acute on chronic LEFT holohemispheric 6 mm subdural hematoma.  5 mm subacute to chronic RIGHT holohemispheric subdural hematoma.  No midline shift.   Electronically Signed   By: Awilda Metro M.D.   On: 10/17/2014 05:07   Ct Head Wo Contrast  10/16/2014   CLINICAL DATA:  Altered mental status and seizure like activity  EXAM: CT HEAD WITHOUT CONTRAST  TECHNIQUE: Contiguous axial images were obtained from the base of the skull through the vertex without intravenous contrast.  COMPARISON:  10/08/2014  FINDINGS: Skull and Sinuses:Negative for fracture or destructive process. The mastoids, middle ears, and imaged paranasal sinuses are clear.  Orbits: Bilateral cataract resection.  No traumatic findings.  Brain: Multi focal discontinuous subdural hematoma around the left  cerebral convexity which is intermediate and high density. The thickest component is in the left frontal region measuring 6 mm and with minimal mass effect on the cortex.  Thin subdural hematoma, mainly isodense, also present around the right frontal convexity.  No evidence of subarachnoid or parenchymal hemorrhage.  Generalized cortical atrophy. No evidence of acute infarct, hydrocephalus, or mass lesion.  Critical Value/emergent results were called by telephone at the time of interpretation on 10/16/2014 at 5:22 am to Dr. Bebe Shaggy, who verbally acknowledged these results.  IMPRESSION: 1. Mixed density subdural hematoma on the left, 6 mm in the left frontal region. 2. 5 mm right frontal subdural hematoma.   Electronically Signed   By: Marnee Spring M.D.   On: 10/16/2014 05:25   Ct Head Wo Contrast  10/08/2014   CLINICAL DATA:  Status post fall. Left posterior headache and hearing noises on the left. Initial encounter.  EXAM: CT HEAD WITHOUT CONTRAST  TECHNIQUE: Contiguous axial images were obtained from the base of the skull through the vertex without intravenous contrast.  COMPARISON:  CT of the head performed 10/06/2014  FINDINGS: There is no evidence of acute infarction, mass lesion, or intra- or extra-axial hemorrhage on CT.  Prominence of the ventricles and sulci reflects moderate cortical volume loss. Cerebellar atrophy is noted.  The brainstem and fourth ventricle are within normal limits. The basal ganglia are unremarkable in appearance. The cerebral hemispheres demonstrate grossly normal gray-white differentiation. No mass effect or midline shift is seen.  There is no evidence of fracture; visualized osseous structures are unremarkable in appearance. The visualized portions of the orbits are within normal limits. The paranasal sinuses and mastoid air cells are well-aerated. No significant soft tissue abnormalities are seen.  IMPRESSION: 1. No evidence of traumatic intracranial injury or fracture. 2.  Moderate cortical volume loss noted.   Electronically Signed   By: Roanna Raider M.D.   On: 10/08/2014 02:51   Ct Head Wo Contrast  10/06/2014   CLINICAL DATA:  Posttraumatic headache after falling out of chair in yard today. No loss of consciousness.  EXAM: CT HEAD WITHOUT CONTRAST  CT CERVICAL SPINE WITHOUT CONTRAST  TECHNIQUE: Multidetector CT imaging of the head and cervical spine was performed following the standard protocol without intravenous contrast. Multiplanar CT image reconstructions of the cervical spine were also generated.  COMPARISON:  CT scan of head of August 02, 2013.  FINDINGS: CT HEAD FINDINGS  Bony calvarium appears intact. Mild diffuse cortical atrophy is noted. No mass effect or midline shift is noted. Ventricular size is within normal limits. There is no evidence of mass lesion, hemorrhage or acute infarction.  CT CERVICAL SPINE FINDINGS  No fracture or spondylolisthesis is noted. Mild degenerative disc disease is noted at C6-7. Posterior facet joints appear normal.  IMPRESSION: Mild diffuse cortical atrophy. No acute intracranial abnormality seen.  Mild degenerative disc disease is noted at C6-7. No acute abnormality seen in cervical spine.   Electronically Signed   By: Lupita Raider, M.D.   On: 10/06/2014 21:36   Ct Cervical Spine Wo Contrast  10/06/2014   CLINICAL DATA:  Posttraumatic headache after falling out of chair in yard today. No loss of consciousness.  EXAM: CT HEAD WITHOUT CONTRAST  CT CERVICAL SPINE WITHOUT CONTRAST  TECHNIQUE: Multidetector CT imaging of the head and cervical spine was performed following the standard protocol without intravenous contrast.  Multiplanar CT image reconstructions of the cervical spine were also generated.  COMPARISON:  CT scan of head of August 02, 2013.  FINDINGS: CT HEAD FINDINGS  Bony calvarium appears intact. Mild diffuse cortical atrophy is noted. No mass effect or midline shift is noted. Ventricular size is within normal limits. There  is no evidence of mass lesion, hemorrhage or acute infarction.  CT CERVICAL SPINE FINDINGS  No fracture or spondylolisthesis is noted. Mild degenerative disc disease is noted at C6-7. Posterior facet joints appear normal.  IMPRESSION: Mild diffuse cortical atrophy. No acute intracranial abnormality seen.  Mild degenerative disc disease is noted at C6-7. No acute abnormality seen in cervical spine.   Electronically Signed   By: Lupita Raider, M.D.   On: 10/06/2014 21:36   Dg Hip Unilat With Pelvis 2-3 Views Right  10/20/2014   CLINICAL DATA:  Right hip and groin pain today after a fall tonight.  EXAM: DG HIP (WITH OR WITHOUT PELVIS) 2-3V RIGHT  COMPARISON:  None.  FINDINGS: There is no evidence of hip fracture or dislocation. There is no evidence of arthropathy or other focal bone abnormality. Vascular calcifications.  IMPRESSION: Negative.   Electronically Signed   By: Burman Nieves M.D.   On: 10/20/2014 05:23    ECG  Atrial fibrillation. 83 bpm. Chronic LBBB.   Echocardiogram 10/17/14  Study Conclusions  - Left ventricle: The cavity size was normal. Wall thickness was increased in a pattern of mild LVH. Systolic function was severely reduced. The estimated ejection fraction was in the range of 20% to 25%. Diffuse hypokinesis. There is akinesis of the anteroseptal and apical myocardium. - Aortic valve: There was trivial regurgitation. - Mitral valve: Calcified annulus. There was mild regurgitation. - Left atrium: The atrium was mildly dilated. - Pulmonary arteries: Systolic pressure was mildly increased. PA peak pressure: 33 mm Hg (S).  Impressions:  - Global hypokinesis with anteroseptal and apical akinesis; overall severely reduced LV function; definity used with no apical thrombus noted; trace AI; mild MR; mild LAE; mild TR; mildly elevated pulmonary pressure.    ASSESSMENT AND PLAN  1. Tachy Arrhthymias: ICD interrogation reveals recent episode of 15 secs of  a rapid arrhythmia which self terminated w/o need for device therapy. Detection rate is set to > 200 bpm so unsure if this was rapid atrial fibrillation vs VT. Patient is currently stable and in afib with a CVR. He is on BB therapy but unable to increase dose due to soft BP. MD to follow with recommendations.    Signed, Robbie Lis, PA-C 10/20/2014, 10:31 AM

## 2014-10-20 NOTE — H&P (Signed)
[facility] History and Physical                                                                                    [name], is a 80 y.o. male  MRN: [id number]   DOB - 10/28/1934  Admit Date - 10/20/2014  Outpatient Primary MD for the patient is [name], MD  Referring Physician:  Dr. Romeo Apple  Chief Complaint:   Chief Complaint  Patient presents with  . Seizures     HPI  [name]  is a 80 y.o. male, with congestive heart failure (EF of 20-25% in July 2016), atrial fibrillation, ICD in place, type 2 diabetes, COPD and obstructive sleep apnea. He presents with at least 2 similar episodes of neurological change and syncope.  He was admitted to [facility] on July 10.  He states that after watching the race on 7/9 PM he got up to go to the bathroom.  He felt the right side of his face and hand drawing up. He tried to speak to ask for help but could not make a sound, he fell to the ground and had bowel incontinence. In the hospital he was found to have a subdural hematoma which neurosurgery did not feel warranted intervention.  He was seen by neurology and had a negative EEG.  They recommended that he follow-up as an outpatient with Dr.[place]oonquah.  Due to the subdural hematoma he was taken off of Coumadin and was supposed to start Eliquis in 1 week after being cleared by neurology.  Last evening at approximately midnight he was placing ice in his glass when his right hand and arm began to feel as though it was drawing up again. He tried asked for help but again could not speak. He felt as though he was going to fall so he placed his back against the refrigerator and slumped to the floor. His vision went black and he did not remember hitting the floor. According to his wife he lost consciousness.  The patient reported biting the side of his mouth.  He denies ever having previous seizures and has never been on anti-seizure medication.  In the ER his pulse rate has varied  widely from 26-87. His ICD was interrogated and it was learned that the patient had 15 seconds of high radioactivity (pulse greater than 200) at approximately 11:53 PM last night.  His CT head was negative for acute change and showed an unchanged appearance of his small bilateral subdural hematomas.  Cardiology has seen the patient in the emergency department. They recommend holding Coreg, obtaining carotid Dopplers, placing an event monitor outpatient.  Neurology has also seen the patient in the emergency department and their recommendations are pending.   Review of Systems   In addition to the HPI above,  No Fever-chills, No Headache No problems swallowing food or Liquids, No Chest pain, Cough or Shortness of Breath, No Abdominal pain, No Nausea or Vomiting, Bowel movements are regular, No Blood in stool or Urine, No dysuria, No new skin rashes or bruises, No new joints pains-aches,  No new weakness, tingling, numbness in any extremity, No recent weight gain or loss, A  full 10 point Review of Systems was done, except as stated above, all other Review of Systems were negative.  Past Medical History  Past Medical History  Diagnosis Date  . Arteriosclerotic cardiovascular disease (ASCVD)   . COPD (chronic obstructive pulmonary disease)   . Gout   . DJD (degenerative joint disease)   . Hyperlipidemia   . Chronic anticoagulation 2012    2012  . CHF (congestive heart failure)   . Atrial fibrillation     Onset in 2012  . Atrial flutter   . ICD (implantable cardiac defibrillator) in place   . Myocardial infarction 1998  . Ischemic cardiomyopathy   . Obstructive sleep apnea     "went away when I lost a bunch of weight" (08/17/2012)  . Type II diabetes mellitus   . Kidney stone     "just once" (08/17/2012)  . NICM (nonischemic cardiomyopathy), EF 25-30% 09-11-2012  . At risk for sudden cardiac death 09-11-12  . Permanent atrial fibrillation 02/15/2011    Initial onset in 03/2011 with  rapid ventricular response   . S/P ICD (internal cardiac defibrillator) procedure, 08/17/12, [employer] 09-11-2012    [employer]    Past Surgical History  Procedure Laterality Date  . Cholecystectomy  2009  . Knee arthroplasty Left 1978    "tendon & cartilege repair" (08/17/2012)  . Vasectomy  ~ 1964  . Cystoscopy/retrograde/ureteroscopy  06/28/2011    Procedure: CYSTOSCOPY/RETROGRADE/URETEROSCOPY;  Surgeon: Ky Barban, MD;  Location: [facility];  Service: Urology;  Laterality: Right;  . Stone extraction with basket  06/28/2011    Procedure: STONE EXTRACTION WITH BASKET;  Surgeon: Ky Barban, MD;  Location: [facility];  Service: Urology;  Laterality: Right;  specimen given to family per MD  . Cardiac defibrillator placement  08/17/2012    [employer]  . Coronary angioplasty with stent placement  07/14/1996    "1" (08/17/2012)  . Cataract extraction w/ intraocular lens  implant, bilateral Bilateral ~ 2011  . US echocardiography  07/08/2012    EF <20%,mild MR,TR,LA severely dilated  . Myoview perfusion scan  06/02/2012    low risk, extensive scar entire LAD & RCA territory  . S/p icd  08/2012    [employer]  . Implantable cardioverter defibrillator implant N/A 08/17/2012    Procedure: IMPLANTABLE CARDIOVERTER DEFIBRILLATOR IMPLANT;  Surgeon: Thurmon Fair, MD;  Location: [facility]ATH [facility];  Service: Cardiovascular;  Laterality: N/A;  . Cardiac catheterization  02/2003      Social History History  Substance Use Topics  . Smoking status: Former Smoker -- 2.00 packs/day for 40 years    Types: Cigarettes    Quit date: 04/08/1992  . Smokeless tobacco: Former Neurosurgeon  . Alcohol Use: No     Comment: 08/17/2012 "quit drinking in 1983"   lives at home with his wife. Is independent with ADLs.  Family History Family History  Problem Relation Age of Onset  . Heart failure Mother   . Heart failure Father    his father died at age 37 after an MI. His mother died at age 78 with heart  failure.  Prior to Admission medications   Medication Sig Start Date End Date Taking? Authorizing Provider  carvedilol (COREG) 12.5 MG tablet Take 12.5 mg by mouth 2 (two) times daily with a meal.   Yes Historical Provider, MD  digoxin (LANOXIN) 0.25 MG tablet Take 1 tablet (0.25 mg total) by mouth daily. 02/28/12 01/26/15 Yes Freeman Caldron, PA-C  furosemide (LASIX) 40 MG  tablet Take 1 tablet (40 mg total) by mouth daily. 07/12/13  Yes Lennette Bihari, MD  metFORMIN (GLUCOPHAGE) 1000 MG tablet Take 1 tablet (1,000 mg total) by mouth 2 (two) times daily with a meal. 10/18/14  Yes Drema Dallas, MD  pravastatin (PRAVACHOL) 40 MG tablet Take 40 mg by mouth daily.   Yes Historical Provider, MD  magnesium oxide (MAG-OX) 400 MG tablet Take 400 mg by mouth 2 (two) times daily.      Historical Provider, MD  Omega-3 Fatty Acids (FISH OIL BURP-LESS) 1000 MG CAPS Take 1,200 mg by mouth 2 (two) times daily.     Historical Provider, MD    Allergies  Allergen Reactions  . Lipitor [Atorvastatin] Other (See Comments)    myalgias   . Percocet [Oxycodone-Acetaminophen] Nausea And Vomiting    Needs to drink milk and take a Zofran  . Procaine Hcl Nausea And Vomiting  . Tramadol Nausea And Vomiting  . Vicodin [Hydrocodone-Acetaminophen] Nausea And Vomiting    Physical Exam  Vitals  Blood pressure 109/85, pulse 52, temperature 97.9 F (36.6 C), temperature source Oral, resp. rate 18, height 5\' 5"  (1.651 m), weight 76.068 kg (167 lb 11.2 oz), SpO2 95 %.   General: Well-developed male lying in bed in NAD, wife at bedside  Psych:  Normal affect and insight, Not Suicidal or Homicidal, Awake Alert, Oriented X 3.  Neuro:   No F.N deficits, ALL C.Nerves Intact, Strength 5/5 all 4 extremities, Sensation intact all 4 extremities.  ENT:  Ears and Eyes appear Normal, Conjunctivae clear, PER. Moist oral mucosa without erythema or exudates.  Neck:  Supple, No lymphadenopathy appreciated  Respiratory:   Symmetrical chest wall movement, Good air movement bilaterally, CTAB.  Cardiac:  RRR, No Murmurs, no LE edema noted, no JVD.    Abdomen:  Positive bowel sounds, Soft, Non tender, Non distended,  No masses appreciated  Skin:  No Cyanosis, Normal Skin Turgor, No Skin Rash or Bruise.  Extremities:  Able to move all 4. 5/5 strength in each,  no effusions.  Data Review  CBC  Recent Labs Lab 10/16/14 0154 10/17/14 0211 10/20/14 0213  WBC 11.3* 8.9 9.5  HGB 15.1 15.1 15.5  HCT 43.0 43.8 44.1  PLT 143* 142* 151  MCV 97.1 97.6 96.3  MCH 34.1* 33.6 33.8  MCHC 35.1 34.5 35.1  RDW 13.7 13.6 13.6  LYMPHSABS 1.8  --   --   MONOABS 0.8  --   --   EOSABS 0.1  --   --   BASOSABS 0.0  --   --     Chemistries   Recent Labs Lab 10/16/14 0154 10/16/14 1200 10/17/14 0211 10/17/14 2222 10/20/14 0213 10/20/14 1520  NA 137  --  140  --  138  --   K 3.7  --  3.5  --  3.5  --   CL 99*  --  99*  --  100*  --   CO2 28  --  32  --  28  --   GLUCOSE 193*  --  184* 231* 226*  --   BUN 14  --  11  --  13  --   CREATININE 1.06  --  1.17  --  1.04  --   CALCIUM 8.8*  --  9.0  --  9.4  --   MG  --  1.5*  --   --   --  1.7  AST  --   --  18  --   --   --  ALT  --   --  15*  --   --   --   ALKPHOS  --   --  50  --   --   --   BILITOT  --   --  1.0  --   --   --      Coagulation profile  Recent Labs Lab 10/16/14 0154 10/17/14 0211 10/20/14 0213  INR 2.61* 1.71* 1.10     Cardiac Enzymes  Recent Labs Lab 10/16/14 0552  TROPONINI 0.04*     Urinalysis    Component Value Date/Time   COLORURINE YELLOW 10/08/2014 0321   APPEARANCEUR CLEAR 10/08/2014 0321   LABSPEC 1.017 10/08/2014 0321   PHURINE 5.0 10/08/2014 0321   GLUCOSEU NEGATIVE 10/08/2014 0321   HGBUR SMALL* 10/08/2014 0321   BILIRUBINUR SMALL* 10/08/2014 0321   KETONESUR NEGATIVE 10/08/2014 0321   PROTEINUR NEGATIVE 10/08/2014 0321   UROBILINOGEN 1.0 10/08/2014 0321   NITRITE NEGATIVE 10/08/2014 0321    LEUKOCYTESUR NEGATIVE 10/08/2014 0321    Imaging results:   Dg Chest 2 View  10/16/2014   CLINICAL DATA:  Altered mental status.  Syncope  EXAM: CHEST  2 VIEW  COMPARISON:  10/06/2014  FINDINGS: EKG leads causes significant artifact as they loop across the chest.  Stable positioning of single chamber right ventricular ICD/pacer lead.  Borderline cardiomegaly is stable. Stable, negative aortic and hilar contours. There is no edema, consolidation, effusion, or pneumothorax. Cholecystectomy clips.  IMPRESSION: No active cardiopulmonary disease.   Electronically Signed   By: Marnee Spring M.D.   On: 10/16/2014 05:42   Dg Chest 2 View  10/06/2014   CLINICAL DATA:  Mid upper back pain after falling out of a chair in the lawn this evening.  EXAM: CHEST  2 VIEW  COMPARISON:  04/05/2014  FINDINGS: There is mild unchanged cardiomegaly. The transvenous lead appears intact. There is no pneumothorax. Mediastinal contours are normal and unchanged. The lungs are clear. There is no effusion. There is an acute minimally displaced fracture at the lateral aspect of the left fifth rib. Portions of the left second through sixth ribs are obscured by the cardiac pacing device.  IMPRESSION: Minimally displaced fracture at the lateral aspect of the left fifth rib.   Electronically Signed   By: Ellery Plunk M.D.   On: 10/06/2014 21:44   Dg Thoracic Spine 2 View  10/06/2014   CLINICAL DATA:  Mid back pain following falling from chair, initial encounter  EXAM: THORACIC SPINE - 2-3 VIEWS  COMPARISON:  04/05/2014  FINDINGS: Multilevel osteophytic changes are seen. No compression deformities are noted. No paraspinal mass lesion is seen. The visualized rib cage is within normal limits. A pacing device is again seen and stable.  IMPRESSION: No acute abnormality noted.  Multilevel degenerative change is seen.   Electronically Signed   By: Alcide Clever M.D.   On: 10/06/2014 21:47   Ct Head Wo Contrast  10/20/2014   CLINICAL  DATA:  Come avulsion at home today. No prior history of seizures. Patient has skull fracture and head bleed after a fall on 10/08/2014.  EXAM: CT HEAD WITHOUT CONTRAST  TECHNIQUE: Contiguous axial images were obtained from the base of the skull through the vertex without intravenous contrast.  COMPARISON:  10/17/2014  FINDINGS: Left frontotemporal parietal subdural hematoma with subacute and chronic features. Maximal depth measures about 7 mm. No associated mass effect or midline shift. Subacute appearing right temporal subdural hematoma measuring about 3 mm depth. No associated  mass effect or midline shift.  Diffuse atrophy. No ventricular dilatation. Gray-white matter junctions are distinct. No intraparenchymal or ventricular hemorrhage. Basal cisterns are not effaced. Vascular calcifications. No depressed skull fractures. Visualized paranasal sinuses and left mastoid air cells are not opacified. Opacification of some of the right at mastoid air cells. No significant change since previous study.  IMPRESSION: Unchanged appearance of small bilateral subdural hematomas. No evidence of any progression or rebleed. Diffuse atrophy and small vessel ischemic changes.   Electronically Signed   By: Burman Nieves M.D.   On: 10/20/2014 03:21   Ct Head Wo Contrast  10/17/2014   CLINICAL DATA:  Followup subdural hematoma/subarachnoid hemorrhage. Altered mental status with seizure-like activity.  EXAM: CT HEAD WITHOUT CONTRAST  TECHNIQUE: Contiguous axial images were obtained from the base of the skull through the vertex without intravenous contrast.  COMPARISON:  CT head Aug 16, 2014  FINDINGS: LEFT holo hemispheric acute on chronic subdural hematoma. Intermediate data hypodense RIGHT holo hemispheric 4 mm subdural hematoma. No midline shift. No intraparenchymal hemorrhage. No hydrocephalus. No acute large vascular territory infarct. Basal cisterns are patent. Moderate calcific atherosclerosis of the carotid siphons and  to lesser extent included vertebral arteries.  Status post bilateral ocular lens implants. Paranasal sinuses and mastoid air cells are well aerated. No skull fracture.  IMPRESSION: Evolving acute on chronic LEFT holohemispheric 6 mm subdural hematoma.  5 mm subacute to chronic RIGHT holohemispheric subdural hematoma.  No midline shift.   Electronically Signed   By: Awilda Metro M.D.   On: 10/17/2014 05:07   Ct Head Wo Contrast  10/16/2014   CLINICAL DATA:  Altered mental status and seizure like activity  EXAM: CT HEAD WITHOUT CONTRAST  TECHNIQUE: Contiguous axial images were obtained from the base of the skull through the vertex without intravenous contrast.  COMPARISON:  10/08/2014  FINDINGS: Skull and Sinuses:Negative for fracture or destructive process. The mastoids, middle ears, and imaged paranasal sinuses are clear.  Orbits: Bilateral cataract resection.  No traumatic findings.  Brain: Multi focal discontinuous subdural hematoma around the left cerebral convexity which is intermediate and high density. The thickest component is in the left frontal region measuring 6 mm and with minimal mass effect on the cortex.  Thin subdural hematoma, mainly isodense, also present around the right frontal convexity.  No evidence of subarachnoid or parenchymal hemorrhage.  Generalized cortical atrophy. No evidence of acute infarct, hydrocephalus, or mass lesion.  Critical Value/emergent results were called by telephone at the time of interpretation on 10/16/2014 at 5:22 am to Dr. Bebe Shaggy, who verbally acknowledged these results.  IMPRESSION: 1. Mixed density subdural hematoma on the left, 6 mm in the left frontal region. 2. 5 mm right frontal subdural hematoma.   Electronically Signed   By: Marnee Spring M.D.   On: 10/16/2014 05:25   Ct Head Wo Contrast  10/08/2014   CLINICAL DATA:  Status post fall. Left posterior headache and hearing noises on the left. Initial encounter.  EXAM: CT HEAD WITHOUT CONTRAST   TECHNIQUE: Contiguous axial images were obtained from the base of the skull through the vertex without intravenous contrast.  COMPARISON:  CT of the head performed 10/06/2014  FINDINGS: There is no evidence of acute infarction, mass lesion, or intra- or extra-axial hemorrhage on CT.  Prominence of the ventricles and sulci reflects moderate cortical volume loss. Cerebellar atrophy is noted.  The brainstem and fourth ventricle are within normal limits. The basal ganglia are unremarkable in appearance. The cerebral hemispheres  demonstrate grossly normal gray-white differentiation. No mass effect or midline shift is seen.  There is no evidence of fracture; visualized osseous structures are unremarkable in appearance. The visualized portions of the orbits are within normal limits. The paranasal sinuses and mastoid air cells are well-aerated. No significant soft tissue abnormalities are seen.  IMPRESSION: 1. No evidence of traumatic intracranial injury or fracture. 2. Moderate cortical volume loss noted.   Electronically Signed   By: Roanna Raider M.D.   On: 10/08/2014 02:51   Ct Head Wo Contrast  10/06/2014   CLINICAL DATA:  Posttraumatic headache after falling out of chair in yard today. No loss of consciousness.  EXAM: CT HEAD WITHOUT CONTRAST  CT CERVICAL SPINE WITHOUT CONTRAST  TECHNIQUE: Multidetector CT imaging of the head and cervical spine was performed following the standard protocol without intravenous contrast. Multiplanar CT image reconstructions of the cervical spine were also generated.  COMPARISON:  CT scan of head of August 02, 2013.  FINDINGS: CT HEAD FINDINGS  Bony calvarium appears intact. Mild diffuse cortical atrophy is noted. No mass effect or midline shift is noted. Ventricular size is within normal limits. There is no evidence of mass lesion, hemorrhage or acute infarction.  CT CERVICAL SPINE FINDINGS  No fracture or spondylolisthesis is noted. Mild degenerative disc disease is noted at C6-7.  Posterior facet joints appear normal.  IMPRESSION: Mild diffuse cortical atrophy. No acute intracranial abnormality seen.  Mild degenerative disc disease is noted at C6-7. No acute abnormality seen in cervical spine.   Electronically Signed   By: Lupita Raider, M.D.   On: 10/06/2014 21:36   Ct Cervical Spine Wo Contrast  10/06/2014   CLINICAL DATA:  Posttraumatic headache after falling out of chair in yard today. No loss of consciousness.  EXAM: CT HEAD WITHOUT CONTRAST  CT CERVICAL SPINE WITHOUT CONTRAST  TECHNIQUE: Multidetector CT imaging of the head and cervical spine was performed following the standard protocol without intravenous contrast. Multiplanar CT image reconstructions of the cervical spine were also generated.  COMPARISON:  CT scan of head of August 02, 2013.  FINDINGS: CT HEAD FINDINGS  Bony calvarium appears intact. Mild diffuse cortical atrophy is noted. No mass effect or midline shift is noted. Ventricular size is within normal limits. There is no evidence of mass lesion, hemorrhage or acute infarction.  CT CERVICAL SPINE FINDINGS  No fracture or spondylolisthesis is noted. Mild degenerative disc disease is noted at C6-7. Posterior facet joints appear normal.  IMPRESSION: Mild diffuse cortical atrophy. No acute intracranial abnormality seen.  Mild degenerative disc disease is noted at C6-7. No acute abnormality seen in cervical spine.   Electronically Signed   By: Lupita Raider, M.D.   On: 10/06/2014 21:36   Dg Hip Unilat With Pelvis 2-3 Views Right  10/20/2014   CLINICAL DATA:  Right hip and groin pain today after a fall tonight.  EXAM: DG HIP (WITH OR WITHOUT PELVIS) 2-3V RIGHT  COMPARISON:  None.  FINDINGS: There is no evidence of hip fracture or dislocation. There is no evidence of arthropathy or other focal bone abnormality. Vascular calcifications.  IMPRESSION: Negative.   Electronically Signed   By: Burman Nieves M.D.   On: 10/20/2014 05:23    My personal review of EKG: Afib,  LBBB.   Assessment & Plan  Principal Problem:   Syncope and collapse Active Problems:   Arteriosclerotic cardiovascular disease (ASCVD)   Permanent atrial fibrillation   COPD (chronic obstructive pulmonary disease)  Chronic anticoagulation, coumadin   NICM (nonischemic cardiomyopathy), EF 25-30%   S/P ICD (internal cardiac defibrillator) procedure, 08/17/12, [employer] implanted   Obstructive sleep apnea   Diabetes type 2, uncontrolled   Systolic CHF, chronic  Syncope and collapse Possibly multifactorial. The patient has A. fib and has a documented episode of rapid ventricular response last night at approximately the same time that his episode took place.  Also he describes biting the inside of his mouth, previous bowel incontinence with a similar episode, and a postictal period.  Consequently seizure may be a possibility. Both Cardiology and Neurology have been consulted.  Cardiology recommends holding Coreg at this point and likely placing and outpatient event monitor.  Repeat EEG is negative on 7/14. Neurology recommended considering a trial of anticonvulsive therapy which I will order at this time.  Afib Not well controlled. Rate vacillating significantly. Currently not on anticoagulation due to previous SDH. Coumadin was d/c'd on 7/10 for 1 week.  He was supposed to be restarted on Eliquis once cleared by neurology Holding Coreg. Will defer to cardiology for management.  Chronic systolic heart failure LVEF equals 20% Currently stable. Continue Lasix, digoxin. Holding Coreg per cardiology.   Hypomagnesemia Will continue oral supplementation and add 1 dose of IV magnesium.  Uncontrolled type 2 diabetes Check hemoglobin A1c.  Hold metformin. Place on low-dose Lantus and NovoLog sliding scale.  Hyperlipidemia Continue pravastatin  Obstructive sleep apnea Not on C Pap at home.  COPD Not currently wheezing. Does not appear to be on any home medications.  Stable.     Consultants Called:  Cardiology, Neurology  Family Communication:   Wife at bedside  Code Status:  Full code  Condition:  Guarded  Potential Disposition: to  home in 48-72 hours pending workup results.  Time spent in minutes : 60   Algis Downs,  PA-C on 10/20/2014 at 5:34 PM Between 7am to 7pm - Pager - [phone] After 7pm go to [email or web address] - password [email or web address] And look for the night coverage person covering me after hours  [facility]

## 2014-10-20 NOTE — Consult Note (Signed)
NEURO HOSPITALIST CONSULT NOTE   Referring physician:Samtani   Reason for Consult: Possible seizure  HPI:                                                                                                                                          [name] is an 80 y.o. male who was recently seen in hospital on 10/16/14 for transient speech difficulty. Patient has history of Afib, ICD and bilateral frontal SDH. At that time he had a transient period in which he was unable to make sounds or or speak. He had no focal weakness. EEG was obtained on that visit and showed no electrographic seizure. He was discharged from hospital on 7/12 and to follow up with Dr. Everlena Cooper as out patient. Patient was tranfered back to ED today after family had found him with AMS on the floor.  Patient states this occurred last night. He was watching TV and stood up, walked to the freezer and reached up to grab a piece of ICE.  He noted his right arm and hand started to contract and he felt as if he was going to fall.  He quickly turned around and leaned his back upon the fridge and slid to his butt. He at this point lost consciousness. His wife found him on the floor with right arm contracted.  She denies foaming at mouth, eye deviation, urinary incontinence. He states when he awoke it took him about 10-15 minutes to feel his normal self. Currently he is back to his baseline.  Patient also describes an episode on 6/30 when he was seated and passed out while falling backward.  I have no information today about what happened while he was unconscious but it appears that he did enough injury to himself that he was diagnosed with a fractured rib.  Head CT on 6/30 and 7/2 showed no evidence of traumatic intracranial injury.    Echo on 7/11 showed 20-25% EF and afib  Past Medical History  Diagnosis Date  . Arteriosclerotic cardiovascular disease (ASCVD)   . COPD (chronic obstructive pulmonary disease)   . Gout    . DJD (degenerative joint disease)   . Hyperlipidemia   . Chronic anticoagulation 2012    2012  . CHF (congestive heart failure)   . Atrial fibrillation     Onset in 2012  . Atrial flutter   . ICD (implantable cardiac defibrillator) in place   . Myocardial infarction 1998  . Ischemic cardiomyopathy   . Obstructive sleep apnea     "went away when I lost a bunch of weight" (08/17/2012)  . Type II diabetes mellitus   . Kidney stone     "just once" (08/17/2012)  . NICM (nonischemic cardiomyopathy), EF 25-30% 08/18/2012  . At risk  for sudden cardiac death 08/18/2012  . Permanent atrial fibrillation 02/15/2011    Initial onset in 03/2011 with rapid ventricular response   . S/P ICD (internal cardiac defibrillator) procedure, 08/17/12, [employer] 08/18/2012    [employer]    Past Surgical History  Procedure Laterality Date  . Cholecystectomy  2009  . Knee arthroplasty Left 1978    "tendon & cartilege repair" (08/17/2012)  . Vasectomy  ~ 1964  . Cystoscopy/retrograde/ureteroscopy  06/28/2011    Procedure: CYSTOSCOPY/RETROGRADE/URETEROSCOPY;  Surgeon: Ky Barban, MD;  Location: [facility];  Service: Urology;  Laterality: Right;  . Stone extraction with basket  06/28/2011    Procedure: STONE EXTRACTION WITH BASKET;  Surgeon: Ky Barban, MD;  Location: [facility];  Service: Urology;  Laterality: Right;  specimen given to family per MD  . Cardiac defibrillator placement  08/17/2012    [employer]  . Coronary angioplasty with stent placement  07/14/1996    "1" (08/17/2012)  . Cataract extraction w/ intraocular lens  implant, bilateral Bilateral ~ 2011  . US echocardiography  07/08/2012    EF <20%,mild MR,TR,LA severely dilated  . Myoview perfusion scan  06/02/2012    low risk, extensive scar entire LAD & RCA territory  . S/p icd  08/2012    [employer]  . Implantable cardioverter defibrillator implant N/A 08/17/2012    Procedure: IMPLANTABLE CARDIOVERTER DEFIBRILLATOR IMPLANT;   Surgeon: Thurmon Fair, MD;  Location: [facility];  Service: Cardiovascular;  Laterality: N/A;  . Cardiac catheterization  02/2003    Family History  Problem Relation Age of Onset  . Heart failure Mother   . Heart failure Father      Social History:  reports that he quit smoking about 22 years ago. His smoking use included Cigarettes. He has a 80 pack-year smoking history. He has quit using smokeless tobacco. He reports that he does not drink alcohol or use illicit drugs.  Allergies  Allergen Reactions  . Lipitor [Atorvastatin] Other (See Comments)    myalgias   . Percocet [Oxycodone-Acetaminophen] Nausea And Vomiting    Needs to drink milk and take a Zofran  . Procaine Hcl Nausea And Vomiting  . Tramadol Nausea And Vomiting  . Vicodin [Hydrocodone-Acetaminophen] Nausea And Vomiting    MEDICATIONS:                                                                                                                     No current facility-administered medications for this encounter.   Current Outpatient Prescriptions  Medication Sig Dispense Refill  . carvedilol (COREG) 12.5 MG tablet Take 12.5 mg by mouth 2 (two) times daily with a meal.    . digoxin (LANOXIN) 0.25 MG tablet Take 1 tablet (0.25 mg total) by mouth daily. 30 tablet 3  . furosemide (LASIX) 40 MG tablet Take 1 tablet (40 mg total) by mouth daily. 30 tablet 10  . metFORMIN (GLUCOPHAGE) 1000 MG tablet Take 1 tablet (1,000 mg total) by mouth 2 (two) times daily  with a meal. 60 tablet 0  . pravastatin (PRAVACHOL) 40 MG tablet Take 40 mg by mouth daily.    . magnesium oxide (MAG-OX) 400 MG tablet Take 400 mg by mouth 2 (two) times daily.      . Omega-3 Fatty Acids (FISH OIL BURP-LESS) 1000 MG CAPS Take 1,200 mg by mouth 2 (two) times daily.         ROS:                                                                                                                                       History obtained from the  patient  General ROS: negative for - chills, fatigue, fever, night sweats, weight gain or weight loss Psychological ROS: negative for - behavioral disorder, hallucinations, memory difficulties, mood swings or suicidal ideation Ophthalmic ROS: negative for - blurry vision, double vision, eye pain or loss of vision ENT ROS: negative for - epistaxis, nasal discharge, oral lesions, sore throat, tinnitus or vertigo Allergy and Immunology ROS: negative for - hives or itchy/watery eyes Hematological and Lymphatic ROS: negative for - bleeding problems, bruising or swollen lymph nodes Endocrine ROS: negative for - galactorrhea, hair pattern changes, polydipsia/polyuria or temperature intolerance Respiratory ROS: negative for - cough, hemoptysis, shortness of breath or wheezing Cardiovascular ROS: negative for - chest pain, dyspnea on exertion, edema or irregular heartbeat Gastrointestinal ROS: negative for - abdominal pain, diarrhea, hematemesis, nausea/vomiting or stool incontinence Genito-Urinary ROS: negative for - dysuria, hematuria, incontinence or urinary frequency/urgency Musculoskeletal ROS: negative for - joint swelling or muscular weakness Neurological ROS: as noted in HPI Dermatological ROS: negative for rash and skin lesion changes   Blood pressure 113/71, pulse 42, temperature 97.9 F (36.6 C), temperature source Oral, resp. rate 20, height  (1.651 m), weight 74.844 kg (165 lb), SpO2 97 %.   Neurologic Examination:                                                                                                      HEENT-  Normocephalic, no lesions, without obvious abnormality.  Normal external eye and conjunctiva.  Normal TM's bilaterally.  Normal auditory canals and external ears. Normal external nose, mucus membranes and septum.  Normal pharynx. Cardiovascular- irregularly irregular rhythm, pulses palpable throughout   Lungs- chest clear, no wheezing, rales, normal symmetric air  entry Abdomen- normal findings: bowel sounds normal Extremities- no edema Lymph-no adenopathy palpable Musculoskeletal-no joint tenderness, deformity or swelling Skin-warm and dry, no hyperpigmentation, vitiligo,  or suspicious lesions  Neurological Examination Mental Status: Alert, oriented, thought content appropriate.  Speech fluent without evidence of aphasia.  Able to follow 3 step commands without difficulty. Cranial Nerves: II: Discs flat bilaterally; Visual fields grossly normal, pupils equal, round, reactive to light and accommodation III,IV, VI: ptosis not present, extra-ocular motions intact bilaterally V,VII: smile symmetric, facial light touch sensation normal bilaterally VIII: hearing normal bilaterally IX,X: uvula rises symmetrically XI: bilateral shoulder shrug XII: midline tongue extension Motor: Right : Upper extremity   5/5    Left:     Upper extremity   5/5  Lower extremity   5/5     Lower extremity   5/5 Tone and bulk:normal tone throughout; no atrophy noted Sensory: Pinprick and light touch intact throughout, bilaterally Deep Tendon Reflexes: 2+ and symmetric throughout No AJ Plantars: Right: downgoing   Left: downgoing Cerebellar: normal finger-to-nose,  and normal heel-to-shin test Gait: not tested for patient safety    Lab Results: Basic Metabolic Panel:  Recent Labs Lab 10/16/14 0154 10/16/14 1200 10/17/14 0211 10/17/14 2222 10/20/14 0213  NA 137  --  140  --  138  K 3.7  --  3.5  --  3.5  CL 99*  --  99*  --  100*  CO2 28  --  32  --  28  GLUCOSE 193*  --  184* 231* 226*  BUN 14  --  11  --  13  CREATININE 1.06  --  1.17  --  1.04  CALCIUM 8.8*  --  9.0  --  9.4  MG  --  1.5*  --   --   --     Liver Function Tests:  Recent Labs Lab 10/17/14 0211  AST 18  ALT 15*  ALKPHOS 50  BILITOT 1.0  PROT 6.1*  ALBUMIN 3.4*   No results for input(s): LIPASE, AMYLASE in the last 168 hours. No results for input(s): AMMONIA in the last 168  hours.  CBC:  Recent Labs Lab 10/16/14 0154 10/17/14 0211 10/20/14 0213  WBC 11.3* 8.9 9.5  NEUTROABS 8.6*  --   --   HGB 15.1 15.1 15.5  HCT 43.0 43.8 44.1  MCV 97.1 97.6 96.3  PLT 143* 142* 151    Cardiac Enzymes:  Recent Labs Lab 10/16/14 0552  TROPONINI 0.04*    Lipid Panel:  Recent Labs Lab 10/16/14 1200  CHOL 141  TRIG 532*  HDL 25*  CHOLHDL 5.6  VLDL UNABLE TO CALCULATE IF TRIGLYCERIDE OVER 400 mg/dL  LDLCALC UNABLE TO CALCULATE IF TRIGLYCERIDE OVER 400 mg/dL    CBG:  Recent Labs Lab 10/17/14 1612 10/17/14 2120 10/18/14 0646 10/18/14 1023 10/20/14 0242  GLUCAP 214* 422* 150* 184* 191*    Microbiology: Results for orders placed or performed during the hospital encounter of 10/16/14  MRSA PCR Screening     Status: None   Collection Time: 10/16/14  9:36 AM  Result Value Ref Range Status   MRSA by PCR NEGATIVE NEGATIVE Final    Comment:        The GeneXpert MRSA Assay (FDA approved for NASAL specimens only), is one component of a comprehensive MRSA colonization surveillance program. It is not intended to diagnose MRSA infection nor to guide or monitor treatment for MRSA infections.     Coagulation Studies:  Recent Labs  10/20/14 0213  LABPROT 14.4  INR 1.10    Imaging: Ct Head Wo Contrast  10/20/2014   CLINICAL DATA:  Come avulsion at  home today. No prior history of seizures. Patient has skull fracture and head bleed after a fall on 10/08/2014.  EXAM: CT HEAD WITHOUT CONTRAST  TECHNIQUE: Contiguous axial images were obtained from the base of the skull through the vertex without intravenous contrast.  COMPARISON:  10/17/2014  FINDINGS: Left frontotemporal parietal subdural hematoma with subacute and chronic features. Maximal depth measures about 7 mm. No associated mass effect or midline shift. Subacute appearing right temporal subdural hematoma measuring about 3 mm depth. No associated mass effect or midline shift.  Diffuse atrophy.  No ventricular dilatation. Gray-white matter junctions are distinct. No intraparenchymal or ventricular hemorrhage. Basal cisterns are not effaced. Vascular calcifications. No depressed skull fractures. Visualized paranasal sinuses and left mastoid air cells are not opacified. Opacification of some of the right at mastoid air cells. No significant change since previous study.  IMPRESSION: Unchanged appearance of small bilateral subdural hematomas. No evidence of any progression or rebleed. Diffuse atrophy and small vessel ischemic changes.   Electronically Signed   By: Burman Nieves M.D.   On: 10/20/2014 03:21   Dg Hip Unilat With Pelvis 2-3 Views Right  10/20/2014   CLINICAL DATA:  Right hip and groin pain today after a fall tonight.  EXAM: DG HIP (WITH OR WITHOUT PELVIS) 2-3V RIGHT  COMPARISON:  None.  FINDINGS: There is no evidence of hip fracture or dislocation. There is no evidence of arthropathy or other focal bone abnormality. Vascular calcifications.  IMPRESSION: Negative.   Electronically Signed   By: Burman Nieves M.D.   On: 10/20/2014 05:23    Felicie Morn PA-C [facility] [phone]  10/20/2014, 1:40 PM  Patient seen and examined.  Clinical course and management discussed.  Necessary edits performed.  I agree with the above.  Assessment and plan of care developed and discussed below.  Assessment/Plan: 80 year old male presenting after multiple episodes of loss of consciousness.  Patient with SDHs bilaterally, left greater than right.  Episodes have not been particularly stereotypical although there does appear to be some suggestion of involvement of the left hemisphere.  Not enough information is known about the first episode.  Concern remains for the possibility of seizure.  Head CT personally reviewed and shows no progression of the [facility].  No acute changes noted either.  Due to pacemaker patient unable to have a MRI and therefore unable to determine if patient may be  showering emboli.  Patient on no antiplatelet or anticoagulant therapy.  Recommendations: 1.  EEG 2.  Consider a trial of anticonvulsant therapy-Keppra 500mg  BID.  No load required.   3.  Seizure precautions     Thana Farr, MD [facility] [phone]  10/20/2014  4:38 PM

## 2014-10-20 NOTE — ED Notes (Signed)
Pt requesting pain medication. MD made aware. 

## 2014-10-20 NOTE — Evaluation (Signed)
Physical Therapy Evaluation Patient Details Name: [name] MRN: [id number] DOB: 08-15-1934 Today's Date: 10/20/2014   History of Present Illness  Patient is a 80 yo male admitted 10/20/14 with syncope/collapse.  Patient with recent hospitalization 10/16/14 - 10/18/14 with ?TIA.  PMH:  CHF, ICM, ICD placement, PAF, HTN, DM, COPD, OSA  Clinical Impression  Patient is functioning and Mod I to Independent level with all mobility and gait.  No acute PT needs identified - PT will sign off.  Encouraged ambulation in hallway with nursing.    Follow Up Recommendations No PT follow up;Supervision - Intermittent    Equipment Recommendations  None recommended by PT    Recommendations for Other Services       Precautions / Restrictions Precautions Precautions: Fall Restrictions Weight Bearing Restrictions: No      Mobility  Bed Mobility               General bed mobility comments: Patient in chair as PT entered room  Transfers Overall transfer level: Modified independent Equipment used: None Transfers: Sit to/from Stand Sit to Stand: Modified independent (Device/Increase time)            Ambulation/Gait Ambulation/Gait assistance: Independent Ambulation Distance (Feet): 400 Feet Assistive device: None Gait Pattern/deviations: Step-through pattern;Decreased stride length   Gait velocity interpretation: Below normal speed for age/gender General Gait Details: Patient demonstrates good gait pattern, balance, and speed  Stairs            Wheelchair Mobility    Modified Rankin (Stroke Patients Only)       Balance Overall balance assessment: No apparent balance deficits (not formally assessed)                                           Pertinent Vitals/Pain Pain Assessment: No/denies pain    Home Living Family/patient expects to be discharged to:: Private residence Living Arrangements: Spouse/significant other Available Help at  Discharge: Family;Available 24 hours/day Type of Home: Mobile home Home Access: Stairs to enter Entrance Stairs-Rails: None Entrance Stairs-Number of Steps: 2 Home Layout: One level Home Equipment: Cane - single point      Prior Function Level of Independence: Independent         Comments: drives     Hand Dominance   Dominant Hand: Right    Extremity/Trunk Assessment   Upper Extremity Assessment: Overall WFL for tasks assessed           Lower Extremity Assessment: Overall WFL for tasks assessed         Communication   Communication: No difficulties (Patient and wife report speech baseline)  Cognition Arousal/Alertness: Awake/alert Behavior During Therapy: Impulsive Overall Cognitive Status: Within Functional Limits for tasks assessed                      General Comments      Exercises        Assessment/Plan    PT Assessment Patent does not need any further PT services  PT Diagnosis Abnormality of gait   PT Problem List    PT Treatment Interventions     PT Goals (Current goals can be found in the Care Plan section) Acute Rehab PT Goals PT Goal Formulation: All assessment and education complete, DC therapy    Frequency     Barriers to discharge  Co-evaluation               End of Session   Activity Tolerance: Patient tolerated treatment well Patient left: in chair;with call bell/phone within reach;with nursing/sitter in room;with family/visitor present Nurse Communication: Mobility status (Encouraged ambulation in hallway with supervision)         Time: 2952-8413 PT Time Calculation (min) (ACUTE ONLY): 35 min   Charges:   PT Evaluation $Initial PT Evaluation Tier I: 1 Procedure PT Treatments $Gait Training: 8-22 mins   PT G CodesVena [name] 13-Nov-2014, 9:21 PM [name]. [name], [name] Acute Rehab Services Pager [phone]

## 2014-10-20 NOTE — ED Notes (Signed)
Pt admits in room that he had a seizure on Saturday.

## 2014-10-20 NOTE — ED Notes (Signed)
Pt family expressing concerns about not seeing a doctor, and patient expressing feelings of wanting to leave if he gets no medical care. RN and EDP have been in patients room. EDP has explained to patient and family plan of care, and delay. EDP currently at bedside.

## 2014-10-20 NOTE — Progress Notes (Signed)
EEG Completed; Results Pending  

## 2014-10-20 NOTE — ED Notes (Signed)
Per EMS:  Pt from home.  Family found pt convulsing at home tonight after he got up to refill his tea. No history of seizures.  Pt had a fall and was transferred to [facility] on 7/2 for a skull fracture and head bleed.  7/10 DC'd home.  7/12 back for re-assessment.  Tonight, seizure.  Pt denies any pain at this time, EMS states negative stroke screen, pt w/ a hx of a-fib and has a defibrillator.

## 2014-10-20 NOTE — ED Notes (Signed)
Pt provided with ice chips, okay'd by MD

## 2014-10-21 ENCOUNTER — Observation Stay (HOSPITAL_COMMUNITY): Payer: Medicare HMO

## 2014-10-21 ENCOUNTER — Encounter (HOSPITAL_COMMUNITY): Payer: Self-pay

## 2014-10-21 ENCOUNTER — Inpatient Hospital Stay (HOSPITAL_COMMUNITY): Payer: Medicare HMO

## 2014-10-21 DIAGNOSIS — R079 Chest pain, unspecified: Secondary | ICD-10-CM | POA: Diagnosis present

## 2014-10-21 DIAGNOSIS — I429 Cardiomyopathy, unspecified: Secondary | ICD-10-CM

## 2014-10-21 DIAGNOSIS — Z9189 Other specified personal risk factors, not elsewhere classified: Secondary | ICD-10-CM

## 2014-10-21 DIAGNOSIS — I2510 Atherosclerotic heart disease of native coronary artery without angina pectoris: Secondary | ICD-10-CM | POA: Diagnosis not present

## 2014-10-21 DIAGNOSIS — R55 Syncope and collapse: Secondary | ICD-10-CM

## 2014-10-21 DIAGNOSIS — I249 Acute ischemic heart disease, unspecified: Secondary | ICD-10-CM | POA: Diagnosis not present

## 2014-10-21 DIAGNOSIS — I481 Persistent atrial fibrillation: Secondary | ICD-10-CM | POA: Diagnosis not present

## 2014-10-21 DIAGNOSIS — R42 Dizziness and giddiness: Secondary | ICD-10-CM | POA: Diagnosis not present

## 2014-10-21 DIAGNOSIS — J439 Emphysema, unspecified: Secondary | ICD-10-CM | POA: Diagnosis not present

## 2014-10-21 LAB — GLUCOSE, CAPILLARY
GLUCOSE-CAPILLARY: 175 mg/dL — AB (ref 65–99)
GLUCOSE-CAPILLARY: 134 mg/dL — AB (ref 65–99)
GLUCOSE-CAPILLARY: 125 mg/dL — AB (ref 65–99)
GLUCOSE-CAPILLARY: 239 mg/dL — AB (ref 65–99)
Glucose-Capillary: 216 mg/dL — ABNORMAL HIGH (ref 65–99)
Glucose-Capillary: 143 mg/dL — ABNORMAL HIGH (ref 65–99)

## 2014-10-21 LAB — HEMOGLOBIN A1C
HEMOGLOBIN A1C: 8.4 % — AB (ref 4.8–5.6)
MEAN PLASMA GLUCOSE: 194 mg/dL

## 2014-10-21 MED ORDER — CARVEDILOL 3.125 MG PO TABS: 3.1250 mg | ORAL_TABLET | Freq: Two times a day (BID) | ORAL | Status: DC

## 2014-10-21 MED ORDER — FUROSEMIDE 40 MG PO TABS: 20.0000 mg | ORAL_TABLET | Freq: Every day | ORAL | Status: DC

## 2014-10-21 MED ORDER — LEVETIRACETAM 500 MG PO TABS: 500.0000 mg | ORAL_TABLET | Freq: Two times a day (BID) | ORAL | Status: DC

## 2014-10-21 MED ORDER — SODIUM CHLORIDE 0.9 % IV SOLN
1000.0000 mg | Freq: Once | INTRAVENOUS | Status: AC
  Administered 2014-10-21: 1000 mg via INTRAVENOUS
  Filled 2014-10-21: qty 10

## 2014-10-21 MED ORDER — CARVEDILOL 6.25 MG PO TABS: 6.2500 mg | ORAL_TABLET | Freq: Two times a day (BID) | ORAL | Status: DC

## 2014-10-21 NOTE — Progress Notes (Signed)
PROGRESS NOTE  Subjective:   80 y/o male with CAD, ICM w/ EF of 20-25%, s/p ICD, chronic atrial fibrillation, HTN, HLD, DM and recent admission for seizures/ syncope and collapse, presenting back to ED with recurrent syncope.  Syncopal episodes are preceded by neuro / mental status changes.  The ICD interrogation did not reveal any high HR episodes of any concern. Has been seen by neuro and started on Keppra     Objective:    Vital Signs:   Temp:  [97.5 F (36.4 C)-98.5 F (36.9 C)] 97.9 F (36.6 C) (07/15 0735) Pulse Rate:  [26-71] 71 (07/15 0923) Resp:  [9-20] 17 (07/15 0735) BP: (89-113)/(43-85) 89/43 mmHg (07/15 0735) SpO2:  [91 %-99 %] 99 % (07/15 0735) Weight:  [76.068 kg (167 lb 11.2 oz)-76.431 kg (168 lb 8 oz)] 76.431 kg (168 lb 8 oz) (07/15 0500)      24-hour weight change: Weight change: 1.225 kg (2 lb 11.2 oz)  Weight trends: Filed Weights   10/20/14 0144 10/20/14 1439 10/21/14 0500  Weight: 74.844 kg (165 lb) 76.068 kg (167 lb 11.2 oz) 76.431 kg (168 lb 8 oz)    Intake/Output:  07/14 0701 - 07/15 0700 In: 240 [P.O.:240] Out: 300 [Urine:300]     Physical Exam: BP 89/43 mmHg  Pulse 71  Temp(Src) 97.9 F (36.6 C) (Oral)  Resp 17  Ht  (1.651 m)  Wt 76.431 kg (168 lb 8 oz)  BMI 28.04 kg/m2  SpO2 99%  Wt Readings from Last 3 Encounters:  10/21/14 76.431 kg (168 lb 8 oz)  10/18/14 77.2 kg (170 lb 3.1 oz)  10/06/14 74.844 kg (165 lb)    General: Vital signs reviewed and noted.   Head: Normocephalic, atraumatic.  Eyes: conjunctivae/corneas clear.  EOM's intact.   Throat: normal  Neck:  normal   Lungs:    clear   Heart:  irreg. Irreg.   Abdomen:  Soft, non-tender, non-distended    Extremities: No edema    Neurologic: A&O X3, CN II - XII are grossly intact.   Psych: Normal     Labs: BMET:  Recent Labs  10/20/14 0213 10/20/14 1520  NA 138  --   K 3.5  --   CL 100*  --   CO2 28  --   GLUCOSE 226*  --   BUN 13  --     CREATININE 1.04  --   CALCIUM 9.4  --   MG  --  1.7    Liver function tests: No results for input(s): AST, ALT, ALKPHOS, BILITOT, PROT, ALBUMIN in the last 72 hours. No results for input(s): LIPASE, AMYLASE in the last 72 hours.  CBC:  Recent Labs  10/20/14 0213  WBC 9.5  HGB 15.5  HCT 44.1  MCV 96.3  PLT 151    Cardiac Enzymes: No results for input(s): CKTOTAL, CKMB, TROPONINI in the last 72 hours.  Coagulation Studies:  Recent Labs  10/20/14 0213  LABPROT 14.4  INR 1.10    Other: Invalid input(s): POCBNP No results for input(s): DDIMER in the last 72 hours.  Recent Labs  10/20/14 1520  HGBA1C 8.4*   No results for input(s): CHOL, HDL, LDLCALC, TRIG, CHOLHDL in the last 72 hours. No results for input(s): TSH, T4TOTAL, T3FREE, THYROIDAB in the last 72 hours.  Invalid input(s): FREET3 No results for input(s): VITAMINB12, FOLATE, FERRITIN, TIBC, IRON, RETICCTPCT in the last 72 hours.   Other results:  Tele   (  personally reviewed )  - atrial fib    Medications:    Infusions:    Scheduled Medications: . digoxin  0.25 mg Oral Daily  . enoxaparin (LOVENOX) injection  40 mg Subcutaneous Q24H  . feeding supplement (ENSURE ENLIVE)  237 mL Oral BID BM  . insulin aspart  0-9 Units Subcutaneous TID WC  . insulin aspart  3 Units Subcutaneous TID WC  . insulin glargine  10 Units Subcutaneous QHS  . levETIRAcetam  500 mg Oral BID  . magnesium oxide  400 mg Oral BID  . omega-3 acid ethyl esters  1 g Oral BID  . pravastatin  40 mg Oral Daily  . sodium chloride  3 mL Intravenous Q12H    Assessment/ Plan:   Principal Problem:   Syncope and collapse Active Problems:   Arteriosclerotic cardiovascular disease (ASCVD)   Permanent atrial fibrillation   COPD (chronic obstructive pulmonary disease)   Chronic anticoagulation, coumadin   NICM (nonischemic cardiomyopathy), EF 25-30%   S/P ICD (internal cardiac defibrillator) procedure, 08/17/12, [employer] implanted   Obstructive sleep apnea   Diabetes type 2, uncontrolled   Systolic CHF, chronic  1. Syncope - sounds neurologic to me  ICD interrogation is ok Follow up with [name] or PA in the office in several weeks.    Disposition: DC to hom e  Length of Stay: 1  Vesta Mixer, Montez Hageman., MD, [facility] 10/21/2014, 10:45 AM Office [phone] Pager [phone]

## 2014-10-21 NOTE — Progress Notes (Signed)
[facility]                                                                              Patient Demographics  [name], is a 80 y.o. male, DOB - 06/22/1934, ZOX:[id number]  Admit date - 10/20/2014   Admitting Physician Rhetta Mura, MD  Outpatient Primary MD for the patient is [name], MD  LOS - 1   Chief Complaint  Patient presents with  . Seizures       Brief HPI   For complete details please refer to admission H and P, but in briefDock [name] is a 80 y.o. male, with congestive heart failure (EF of 20-25% in July 2016), atrial fibrillation, ICD in place, type 2 diabetes, COPD and obstructive sleep apnea. He presents with at least 2 similar episodes of neurological change and syncope. He was admitted to [facility] on July 10. He states that after watching the race on 7/9 PM he got up to go to the bathroom. He felt the right side of his face and hand drawing up. He tried to speak to ask for help but could not make a sound, he fell to the ground and had bowel incontinence. In the hospital he was found to have a subdural hematoma which neurosurgery did not feel warranted intervention. He was seen by neurology and had a negative EEG. They recommended that he follow-up as an outpatient with Dr.Doonquah. Due to the subdural hematoma he was taken off of Coumadin and was supposed to start Eliquis in 1 week after being cleared by neurology. Last evening at approximately midnight he was placing ice in his glass when his right hand and arm began to feel as though it was drawing up again. He tried asked for help but again could not speak. He felt as though he was going to fall so he placed his back against the refrigerator and slumped to the floor. His vision went black and he did not remember hitting the floor. According to his wife he lost consciousness. The patient reported biting the side of his mouth. He denies ever having previous seizures and  has never been on anti-seizure medication. In the ER his pulse rate has varied widely from 26-87. His ICD was interrogated and it was learned that the patient had 15 seconds of high radioactivity (pulse greater than 200) at approximately 11:53 PM last night. His CT head was negative for acute change and showed an unchanged appearance of his small bilateral subdural hematomas. Cardiology has seen the patient in the emergency department. They recommend holding Coreg, obtaining carotid Dopplers, placing an event monitor outpatient.   Assessment & Plan     Syncope and collapse vs seizure Possibly multifactorial. The patient has A. fib and has a documented episode of rapid ventricular response last night at approximately the same time that his episode took place. Also he describes biting the inside of his mouth, previous bowel incontinence with a similar episode, and a postictal period. Consequently seizure may be a possibility. Both Cardiology and Neurology were consulted. -  Cardiology recommended holding Coreg at this point and likely placing an outpatient event  monitor. - Neurology repeated the EEG which was negative.  - Per neurology recommendations, patient was started on oral Keppra 500 mg twice a day. Patient was recommended no driving, operating heavy machinery or participating in any activities in water or heights.  - I have also made an appointment for him with the neurology, Dr. Debarah Crape with [facility] on 11/08/2014. - Carotid Dopplers were done which showed 1-39% ICA stenosis bilaterally. - On 7/15, prior to discharge, patient had another episode of transient right arm weakness and slurred speech with blank stare, stat CT head was negative. Patient was given 1 dose of IV Keppra per neurology recommendations.  Permanent Afib Cardiology was consulted, patient was seen by Dr Elease Hashimoto and reported thatICD interrogation was okay. Per Dr. Malachy Mood consultation note, patient had high rate  episode on the ICD noted for 15 seconds, intermittently. Patient is on beta blocker Coreg, however due to hypotension, not able to increase the dose. -  Patient will continue digoxin. Recommend outpatient event monitor and possibly EP evaluation outpatient. Patient was orthostatic at the time of admission, Lasix and Coreg were held. - Patient had subdural hematoma from the fall last admission and was discharged on 10/18/14. It was recommended at that time that Coumadin to be stopped as patient has difficulty maintaining INRs and to start NOAC at least 1 week out medically cleared by neurology and fall risk is further assessed. -  Currently BP is somewhat stable Coreg will be restarted however at lower dose 6.25 mg twice a day if BP stays stable. Currently not on anticoagulation due to previous SDH.   Chronic systolic heart failure LVEF equals 20%, currently compensated, due to borderline BP, Lasix is currently held for 48 hours.  Hypomagnesemia 1.7 This was replaced with 1 dose of IV magnesium.  Uncontrolled type 2 diabetes Hemoglobin A1c 8.4, patient will resume outpatient regimen.   Hyperlipidemia Continue pravastatin  Obstructive sleep apnea Not on CPap at home.  COPD Not currently wheezing. Does not appear to be on any home medications. Stable.    Code Status: Full code  Family Communication: Discussed in detail with the patient, all imaging results, lab results explained to the patient and wife   Disposition Plan: Discharge held due to possibility of seizure  Time Spent in minutes   25 minutes  Procedures  EEG  Consults   Neurology Cardiology  DVT Prophylaxis Lovenox  Medications  Scheduled Meds: . digoxin  0.25 mg Oral Daily  . enoxaparin (LOVENOX) injection  40 mg Subcutaneous Q24H  . feeding supplement (ENSURE ENLIVE)  237 mL Oral BID BM  . insulin aspart  0-9 Units Subcutaneous TID WC  . insulin aspart  3 Units Subcutaneous TID WC  . insulin glargine  10  Units Subcutaneous QHS  . levETIRAcetam  500 mg Oral BID  . magnesium oxide  400 mg Oral BID  . omega-3 acid ethyl esters  1 g Oral BID  . pravastatin  40 mg Oral Daily  . sodium chloride  3 mL Intravenous Q12H   Continuous Infusions:  PRN Meds:.ondansetron **OR** ondansetron (ZOFRAN) IV, senna-docusate   Antibiotics   Anti-infectives    None        Subjective:   Tayvon Culley was seen and examined today. Patient was doing well in the a.m. however later in the evening prior to discharge, had a transient right-sided weakness with blank stare and slurred speech. Patient denies dizziness, chest pain, shortness of breath, abdominal pain, N/V/D/C. No acute events  overnight.    Objective:   Blood pressure 116/62, pulse 47, temperature 97.9 F (36.6 C), temperature source Oral, resp. rate 19, height 5\' 5"  (1.651 m), weight 76.431 kg (168 lb 8 oz), SpO2 95 %.  Wt Readings from Last 3 Encounters:  10/21/14 76.431 kg (168 lb 8 oz)  10/18/14 77.2 kg (170 lb 3.1 oz)  10/06/14 74.844 kg (165 lb)     Intake/Output Summary (Last 24 hours) at 10/21/14 1842 Last data filed at 10/21/14 1300  Gross per 24 hour  Intake    480 ml  Output    450 ml  Net     30 ml    Exam  General: Alert and oriented x 3, NAD  HEENT:  PERRLA, EOMI, Anicteric Sclera, mucous membranes moist.   Neck: Supple, no JVD, no masses  CVS: S1 S2 auscultated, no rubs, murmurs or gallops. Regular rate and rhythm.  Respiratory: Clear to auscultation bilaterally, no wheezing, rales or rhonchi  Abdomen: Soft, nontender, nondistended, + bowel sounds  Ext: no cyanosis clubbing or edema  Neuro: AAOx3, Cr N's II- XII. Strength 5/5 upper and lower extremities bilaterally  Skin: No rashes  Psych: Normal affect and demeanor, alert and oriented x3    Data Review   Micro Results Recent Results (from the past 240 hour(s))  MRSA PCR Screening     Status: None   Collection Time: 10/16/14  9:36 AM  Result Value Ref  Range Status   MRSA by PCR NEGATIVE NEGATIVE Final    Comment:        The GeneXpert MRSA Assay (FDA approved for NASAL specimens only), is one component of a comprehensive MRSA colonization surveillance program. It is not intended to diagnose MRSA infection nor to guide or monitor treatment for MRSA infections.     Radiology Reports Dg Chest 2 View  10/16/2014   CLINICAL DATA:  Altered mental status.  Syncope  EXAM: CHEST  2 VIEW  COMPARISON:  10/06/2014  FINDINGS: EKG leads causes significant artifact as they loop across the chest.  Stable positioning of single chamber right ventricular ICD/pacer lead.  Borderline cardiomegaly is stable. Stable, negative aortic and hilar contours. There is no edema, consolidation, effusion, or pneumothorax. Cholecystectomy clips.  IMPRESSION: No active cardiopulmonary disease.   Electronically Signed   By: Marnee Spring M.D.   On: 10/16/2014 05:42   Dg Chest 2 View  10/06/2014   CLINICAL DATA:  Mid upper back pain after falling out of a chair in the lawn this evening.  EXAM: CHEST  2 VIEW  COMPARISON:  04/05/2014  FINDINGS: There is mild unchanged cardiomegaly. The transvenous lead appears intact. There is no pneumothorax. Mediastinal contours are normal and unchanged. The lungs are clear. There is no effusion. There is an acute minimally displaced fracture at the lateral aspect of the left fifth rib. Portions of the left second through sixth ribs are obscured by the cardiac pacing device.  IMPRESSION: Minimally displaced fracture at the lateral aspect of the left fifth rib.   Electronically Signed   By: Ellery Plunk M.D.   On: 10/06/2014 21:44   Dg Thoracic Spine 2 View  10/06/2014   CLINICAL DATA:  Mid back pain following falling from chair, initial encounter  EXAM: THORACIC SPINE - 2-3 VIEWS  COMPARISON:  04/05/2014  FINDINGS: Multilevel osteophytic changes are seen. No compression deformities are noted. No paraspinal mass lesion is seen. The  visualized rib cage is within normal limits. A pacing device is again  seen and stable.  IMPRESSION: No acute abnormality noted.  Multilevel degenerative change is seen.   Electronically Signed   By: Alcide Clever M.D.   On: 10/06/2014 21:47   Ct Head Wo Contrast  10/21/2014   CLINICAL DATA:  Right arm numbness  EXAM: CT HEAD WITHOUT CONTRAST  TECHNIQUE: Contiguous axial images were obtained from the base of the skull through the vertex without intravenous contrast.  COMPARISON:  10/20/2014  FINDINGS: No skull fracture is noted. Paranasal sinuses and mastoid air cells are unremarkable. No intraventricular hemorrhage. No parenchymal hemorrhage. No midline shift. Stable small bilateral subdural hematoma. There is no any change from prior exam.  No acute cortical infarction. No mass lesion is noted on this unenhanced scan.  IMPRESSION: No acute intracranial abnormality. No significant change. Stable small bilateral subdural hematoma.   Electronically Signed   By: Natasha Mead M.D.   On: 10/21/2014 17:44   Ct Head Wo Contrast  10/20/2014   CLINICAL DATA:  Come avulsion at home today. No prior history of seizures. Patient has skull fracture and head bleed after a fall on 10/08/2014.  EXAM: CT HEAD WITHOUT CONTRAST  TECHNIQUE: Contiguous axial images were obtained from the base of the skull through the vertex without intravenous contrast.  COMPARISON:  10/17/2014  FINDINGS: Left frontotemporal parietal subdural hematoma with subacute and chronic features. Maximal depth measures about 7 mm. No associated mass effect or midline shift. Subacute appearing right temporal subdural hematoma measuring about 3 mm depth. No associated mass effect or midline shift.  Diffuse atrophy. No ventricular dilatation. Gray-white matter junctions are distinct. No intraparenchymal or ventricular hemorrhage. Basal cisterns are not effaced. Vascular calcifications. No depressed skull fractures. Visualized paranasal sinuses and left mastoid air  cells are not opacified. Opacification of some of the right at mastoid air cells. No significant change since previous study.  IMPRESSION: Unchanged appearance of small bilateral subdural hematomas. No evidence of any progression or rebleed. Diffuse atrophy and small vessel ischemic changes.   Electronically Signed   By: Burman Nieves M.D.   On: 10/20/2014 03:21   Ct Head Wo Contrast  10/17/2014   CLINICAL DATA:  Followup subdural hematoma/subarachnoid hemorrhage. Altered mental status with seizure-like activity.  EXAM: CT HEAD WITHOUT CONTRAST  TECHNIQUE: Contiguous axial images were obtained from the base of the skull through the vertex without intravenous contrast.  COMPARISON:  CT head Aug 16, 2014  FINDINGS: LEFT holo hemispheric acute on chronic subdural hematoma. Intermediate data hypodense RIGHT holo hemispheric 4 mm subdural hematoma. No midline shift. No intraparenchymal hemorrhage. No hydrocephalus. No acute large vascular territory infarct. Basal cisterns are patent. Moderate calcific atherosclerosis of the carotid siphons and to lesser extent included vertebral arteries.  Status post bilateral ocular lens implants. Paranasal sinuses and mastoid air cells are well aerated. No skull fracture.  IMPRESSION: Evolving acute on chronic LEFT holohemispheric 6 mm subdural hematoma.  5 mm subacute to chronic RIGHT holohemispheric subdural hematoma.  No midline shift.   Electronically Signed   By: Awilda Metro M.D.   On: 10/17/2014 05:07   Ct Head Wo Contrast  10/16/2014   CLINICAL DATA:  Altered mental status and seizure like activity  EXAM: CT HEAD WITHOUT CONTRAST  TECHNIQUE: Contiguous axial images were obtained from the base of the skull through the vertex without intravenous contrast.  COMPARISON:  10/08/2014  FINDINGS: Skull and Sinuses:Negative for fracture or destructive process. The mastoids, middle ears, and imaged paranasal sinuses are clear.  Orbits: Bilateral cataract  resection.  No  traumatic findings.  Brain: Multi focal discontinuous subdural hematoma around the left cerebral convexity which is intermediate and high density. The thickest component is in the left frontal region measuring 6 mm and with minimal mass effect on the cortex.  Thin subdural hematoma, mainly isodense, also present around the right frontal convexity.  No evidence of subarachnoid or parenchymal hemorrhage.  Generalized cortical atrophy. No evidence of acute infarct, hydrocephalus, or mass lesion.  Critical Value/emergent results were called by telephone at the time of interpretation on 10/16/2014 at 5:22 am to Dr. Bebe Shaggy, who verbally acknowledged these results.  IMPRESSION: 1. Mixed density subdural hematoma on the left, 6 mm in the left frontal region. 2. 5 mm right frontal subdural hematoma.   Electronically Signed   By: Marnee Spring M.D.   On: 10/16/2014 05:25   Ct Head Wo Contrast  10/08/2014   CLINICAL DATA:  Status post fall. Left posterior headache and hearing noises on the left. Initial encounter.  EXAM: CT HEAD WITHOUT CONTRAST  TECHNIQUE: Contiguous axial images were obtained from the base of the skull through the vertex without intravenous contrast.  COMPARISON:  CT of the head performed 10/06/2014  FINDINGS: There is no evidence of acute infarction, mass lesion, or intra- or extra-axial hemorrhage on CT.  Prominence of the ventricles and sulci reflects moderate cortical volume loss. Cerebellar atrophy is noted.  The brainstem and fourth ventricle are within normal limits. The basal ganglia are unremarkable in appearance. The cerebral hemispheres demonstrate grossly normal gray-white differentiation. No mass effect or midline shift is seen.  There is no evidence of fracture; visualized osseous structures are unremarkable in appearance. The visualized portions of the orbits are within normal limits. The paranasal sinuses and mastoid air cells are well-aerated. No significant soft tissue abnormalities  are seen.  IMPRESSION: 1. No evidence of traumatic intracranial injury or fracture. 2. Moderate cortical volume loss noted.   Electronically Signed   By: Roanna Raider M.D.   On: 10/08/2014 02:51   Ct Head Wo Contrast  10/06/2014   CLINICAL DATA:  Posttraumatic headache after falling out of chair in yard today. No loss of consciousness.  EXAM: CT HEAD WITHOUT CONTRAST  CT CERVICAL SPINE WITHOUT CONTRAST  TECHNIQUE: Multidetector CT imaging of the head and cervical spine was performed following the standard protocol without intravenous contrast. Multiplanar CT image reconstructions of the cervical spine were also generated.  COMPARISON:  CT scan of head of August 02, 2013.  FINDINGS: CT HEAD FINDINGS  Bony calvarium appears intact. Mild diffuse cortical atrophy is noted. No mass effect or midline shift is noted. Ventricular size is within normal limits. There is no evidence of mass lesion, hemorrhage or acute infarction.  CT CERVICAL SPINE FINDINGS  No fracture or spondylolisthesis is noted. Mild degenerative disc disease is noted at C6-7. Posterior facet joints appear normal.  IMPRESSION: Mild diffuse cortical atrophy. No acute intracranial abnormality seen.  Mild degenerative disc disease is noted at C6-7. No acute abnormality seen in cervical spine.   Electronically Signed   By: Lupita Raider, M.D.   On: 10/06/2014 21:36   Ct Cervical Spine Wo Contrast  10/06/2014   CLINICAL DATA:  Posttraumatic headache after falling out of chair in yard today. No loss of consciousness.  EXAM: CT HEAD WITHOUT CONTRAST  CT CERVICAL SPINE WITHOUT CONTRAST  TECHNIQUE: Multidetector CT imaging of the head and cervical spine was performed following the standard protocol without intravenous contrast. Multiplanar CT image  reconstructions of the cervical spine were also generated.  COMPARISON:  CT scan of head of August 02, 2013.  FINDINGS: CT HEAD FINDINGS  Bony calvarium appears intact. Mild diffuse cortical atrophy is noted. No  mass effect or midline shift is noted. Ventricular size is within normal limits. There is no evidence of mass lesion, hemorrhage or acute infarction.  CT CERVICAL SPINE FINDINGS  No fracture or spondylolisthesis is noted. Mild degenerative disc disease is noted at C6-7. Posterior facet joints appear normal.  IMPRESSION: Mild diffuse cortical atrophy. No acute intracranial abnormality seen.  Mild degenerative disc disease is noted at C6-7. No acute abnormality seen in cervical spine.   Electronically Signed   By: Lupita Raider, M.D.   On: 10/06/2014 21:36   Dg Hip Unilat With Pelvis 2-3 Views Right  10/20/2014   CLINICAL DATA:  Right hip and groin pain today after a fall tonight.  EXAM: DG HIP (WITH OR WITHOUT PELVIS) 2-3V RIGHT  COMPARISON:  None.  FINDINGS: There is no evidence of hip fracture or dislocation. There is no evidence of arthropathy or other focal bone abnormality. Vascular calcifications.  IMPRESSION: Negative.   Electronically Signed   By: Burman Nieves M.D.   On: 10/20/2014 05:23    CBC  Recent Labs Lab 10/16/14 0154 10/17/14 0211 10/20/14 0213  WBC 11.3* 8.9 9.5  HGB 15.1 15.1 15.5  HCT 43.0 43.8 44.1  PLT 143* 142* 151  MCV 97.1 97.6 96.3  MCH 34.1* 33.6 33.8  MCHC 35.1 34.5 35.1  RDW 13.7 13.6 13.6  LYMPHSABS 1.8  --   --   MONOABS 0.8  --   --   EOSABS 0.1  --   --   BASOSABS 0.0  --   --     Chemistries   Recent Labs Lab 10/16/14 0154 10/16/14 1200 10/17/14 0211 10/17/14 2222 10/20/14 0213 10/20/14 1520  NA 137  --  140  --  138  --   K 3.7  --  3.5  --  3.5  --   CL 99*  --  99*  --  100*  --   CO2 28  --  32  --  28  --   GLUCOSE 193*  --  184* 231* 226*  --   BUN 14  --  11  --  13  --   CREATININE 1.06  --  1.17  --  1.04  --   CALCIUM 8.8*  --  9.0  --  9.4  --   MG  --  1.5*  --   --   --  1.7  AST  --   --  18  --   --   --   ALT  --   --  15*  --   --   --   ALKPHOS  --   --  50  --   --   --   BILITOT  --   --  1.0  --   --   --     ------------------------------------------------------------------------------------------------------------------ estimated creatinine clearance is 55.9 mL/min (by C-G formula based on Cr of 1.04). ------------------------------------------------------------------------------------------------------------------  Recent Labs  10/20/14 1520  HGBA1C 8.4*   ------------------------------------------------------------------------------------------------------------------ No results for input(s): CHOL, HDL, LDLCALC, TRIG, CHOLHDL, LDLDIRECT in the last 72 hours. ------------------------------------------------------------------------------------------------------------------ No results for input(s): TSH, T4TOTAL, T3FREE, THYROIDAB in the last 72 hours.  Invalid input(s): FREET3 ------------------------------------------------------------------------------------------------------------------ No results for input(s): VITAMINB12, FOLATE, FERRITIN, TIBC, IRON, RETICCTPCT in the  last 72 hours.  Coagulation profile  Recent Labs Lab 10/16/14 0154 10/17/14 0211 10/20/14 0213  INR 2.61* 1.71* 1.10    No results for input(s): DDIMER in the last 72 hours.  Cardiac Enzymes  Recent Labs Lab 10/16/14 0552  TROPONINI 0.04*   ------------------------------------------------------------------------------------------------------------------ Invalid input(s): POCBNP   Recent Labs  10/20/14 1727 10/20/14 2113 10/21/14 0552 10/21/14 0735 10/21/14 1139 10/21/14 1604  GLUCAP 181* 216* 143* 175* 134* 125*     RAI,RIPUDEEP M.D. [facility] 10/21/2014, 6:42 PM  Pager: [phone] Between 7am to 7pm - call Pager - [phone]  After 7pm go to [email or web address] - password [email or web address]  Call night coverage person covering after 7pm

## 2014-10-21 NOTE — Significant Event (Signed)
Rapid Response Event Note  Overview: Time Called: 1600 Arrival Time: 1605 Event Type: Neurologic  Initial Focused Assessment:  Called to evaluate patient for right arm weakness and slurred speech.  Upon my arrival to patients room, RN and family at bedside.  AS per RN, Patient was getting ready to be discharged where he had a blank stare and stated he could not use his right arm.  RN performed NIHSS of 2 for ataxia and slurred speech.  Patient sitting in chair alert and oriented and is now stating his arm feels better.     Interventions:  Symptoms seem to have resolved, primary RN and neuro MD notified and orders received by primary RN.    Event Summary:  RN to call if assistance needed   at      at          [facility], [place]

## 2014-10-21 NOTE — Progress Notes (Signed)
UR Completed Colvin Blatt Graves-Bigelow, RN,BSN [phone]  

## 2014-10-21 NOTE — Progress Notes (Signed)
Nutrition Brief Note  Patient identified on the Malnutrition Screening Tool (MST) Report. Patient reported weight loss of 81 lbs within the past year. According to weight history below, weight loss has been minimal with no significant weight loss over the past year. Nutrition focused physical exam completed.  No muscle or subcutaneous fat depletion noticed.  Wt Readings from Last 15 Encounters:  10/21/14 168 lb 8 oz (76.431 kg)  10/18/14 170 lb 3.1 oz (77.2 kg)  10/06/14 165 lb (74.844 kg)  05/27/14 176 lb 12.8 oz (80.196 kg)  04/07/14 184 lb 11.9 oz (83.8 kg)  01/04/14 175 lb 1.6 oz (79.425 kg)  12/15/13 174 lb (78.926 kg)  11/25/13 174 lb 11.2 oz (79.243 kg)  08/02/13 178 lb (80.74 kg)  03/24/13 192 lb (87.091 kg)  12/24/12 183 lb (83.008 kg)  09/24/12 189 lb 6.4 oz (85.911 kg)  08/27/12 182 lb 1.6 oz (82.6 kg)  08/17/12 180 lb (81.647 kg)  06/02/12 184 lb (83.462 kg)    Body mass index is 28.04 kg/(m^2). Patient meets criteria for overweight based on current BMI.   Current diet order is CHO modified, patient is consuming approximately 100% of meals at this time. Labs and medications reviewed.   No nutrition interventions warranted at this time. If nutrition issues arise, please consult RD.    Joaquin Courts, RD, LDN, CNSC Pager [phone] After Hours Pager [phone]

## 2014-10-21 NOTE — Progress Notes (Signed)
Pt states he is feeling much better.  Sitting up on side of bed drinking Sprite Zero, very talkative and joking with staff.  Wife at bedside.  Will continue to monitor.  [name]

## 2014-10-21 NOTE — Progress Notes (Signed)
Subjective: Patient this morning with afib and not feeling well but doing much better at this time.  Sitting on side of the bed eating breakfast.  No further episodes of altered awareness.    Objective: Current vital signs: BP 89/43 mmHg  Pulse 71  Temp(Src) 97.9 F (36.6 C) (Oral)  Resp 17  Ht  (1.651 m)  Wt 76.431 kg (168 lb 8 oz)  BMI 28.04 kg/m2  SpO2 99% Vital signs in last 24 hours: Temp:  [97.5 F (36.4 C)-98.5 F (36.9 C)] 97.9 F (36.6 C) (07/15 0735) Pulse Rate:  [26-71] 71 (07/15 0923) Resp:  [9-20] 17 (07/15 0735) BP: (89-113)/(43-85) 89/43 mmHg (07/15 0735) SpO2:  [91 %-99 %] 99 % (07/15 0735) Weight:  [76.068 kg (167 lb 11.2 oz)-76.431 kg (168 lb 8 oz)] 76.431 kg (168 lb 8 oz) (07/15 0500)  Intake/Output from previous day: 07/14 0701 - 07/15 0700 In: 240 [P.O.:240] Out: 300 [Urine:300] Intake/Output this shift:   Nutritional status: Diet Carb Modified Fluid consistency:: Thin; Room service appropriate?: Yes  Neurologic Exam: Mental Status: Alert, oriented, thought content appropriate. Speech fluent without evidence of aphasia. Able to follow 3 step commands without difficulty. Cranial Nerves: II: Discs flat bilaterally; Visual fields grossly normal, pupils equal, round, reactive to light and accommodation III,IV, VI: ptosis not present, extra-ocular motions intact bilaterally V,VII: smile symmetric, facial light touch sensation normal bilaterally VIII: hearing normal bilaterally IX,X: uvula rises symmetrically XI: bilateral shoulder shrug XII: midline tongue extension Motor: Right :Upper extremity 5/5Left: Upper extremity 5/5 Lower extremity 5/5Lower extremity 5/5 Tone and bulk:normal tone throughout; no atrophy noted Sensory: Pinprick and light touch intact throughout, bilaterally Deep Tendon Reflexes: 2+ and symmetric throughout No  AJ  Lab Results: Basic Metabolic Panel:  Recent Labs Lab 10/16/14 0154 10/16/14 1200 10/17/14 0211 10/17/14 2222 10/20/14 0213 10/20/14 1520  NA 137  --  140  --  138  --   K 3.7  --  3.5  --  3.5  --   CL 99*  --  99*  --  100*  --   CO2 28  --  32  --  28  --   GLUCOSE 193*  --  184* 231* 226*  --   BUN 14  --  11  --  13  --   CREATININE 1.06  --  1.17  --  1.04  --   CALCIUM 8.8*  --  9.0  --  9.4  --   MG  --  1.5*  --   --   --  1.7    Liver Function Tests:  Recent Labs Lab 10/17/14 0211  AST 18  ALT 15*  ALKPHOS 50  BILITOT 1.0  PROT 6.1*  ALBUMIN 3.4*   No results for input(s): LIPASE, AMYLASE in the last 168 hours. No results for input(s): AMMONIA in the last 168 hours.  CBC:  Recent Labs Lab 10/16/14 0154 10/17/14 0211 10/20/14 0213  WBC 11.3* 8.9 9.5  NEUTROABS 8.6*  --   --   HGB 15.1 15.1 15.5  HCT 43.0 43.8 44.1  MCV 97.1 97.6 96.3  PLT 143* 142* 151    Cardiac Enzymes:  Recent Labs Lab 10/16/14 0552  TROPONINI 0.04*    Lipid Panel:  Recent Labs Lab 10/16/14 1200  CHOL 141  TRIG 532*  HDL 25*  CHOLHDL 5.6  VLDL UNABLE TO CALCULATE IF TRIGLYCERIDE OVER 400 mg/dL  LDLCALC UNABLE TO CALCULATE IF TRIGLYCERIDE OVER 400 mg/dL  CBG:  Recent Labs Lab 10/20/14 0242 10/20/14 1727 10/20/14 2113 10/21/14 0552 10/21/14 0735  GLUCAP 191* 181* 216* 143* 175*    Microbiology: Results for orders placed or performed during the hospital encounter of 10/16/14  MRSA PCR Screening     Status: None   Collection Time: 10/16/14  9:36 AM  Result Value Ref Range Status   MRSA by PCR NEGATIVE NEGATIVE Final    Comment:        The GeneXpert MRSA Assay (FDA approved for NASAL specimens only), is one component of a comprehensive MRSA colonization surveillance program. It is not intended to diagnose MRSA infection nor to guide or monitor treatment for MRSA infections.     Coagulation Studies:  Recent Labs  10/20/14 0213   LABPROT 14.4  INR 1.10    Imaging: Ct Head Wo Contrast  10/20/2014   CLINICAL DATA:  Come avulsion at home today. No prior history of seizures. Patient has skull fracture and head bleed after a fall on 10/08/2014.  EXAM: CT HEAD WITHOUT CONTRAST  TECHNIQUE: Contiguous axial images were obtained from the base of the skull through the vertex without intravenous contrast.  COMPARISON:  10/17/2014  FINDINGS: Left frontotemporal parietal subdural hematoma with subacute and chronic features. Maximal depth measures about 7 mm. No associated mass effect or midline shift. Subacute appearing right temporal subdural hematoma measuring about 3 mm depth. No associated mass effect or midline shift.  Diffuse atrophy. No ventricular dilatation. Gray-white matter junctions are distinct. No intraparenchymal or ventricular hemorrhage. Basal cisterns are not effaced. Vascular calcifications. No depressed skull fractures. Visualized paranasal sinuses and left mastoid air cells are not opacified. Opacification of some of the right at mastoid air cells. No significant change since previous study.  IMPRESSION: Unchanged appearance of small bilateral subdural hematomas. No evidence of any progression or rebleed. Diffuse atrophy and small vessel ischemic changes.   Electronically Signed   By: Burman Nieves M.D.   On: 10/20/2014 03:21   Dg Hip Unilat With Pelvis 2-3 Views Right  10/20/2014   CLINICAL DATA:  Right hip and groin pain today after a fall tonight.  EXAM: DG HIP (WITH OR WITHOUT PELVIS) 2-3V RIGHT  COMPARISON:  None.  FINDINGS: There is no evidence of hip fracture or dislocation. There is no evidence of arthropathy or other focal bone abnormality. Vascular calcifications.  IMPRESSION: Negative.   Electronically Signed   By: Burman Nieves M.D.   On: 10/20/2014 05:23    Medications:  I have reviewed the patient's current medications. Scheduled: . digoxin  0.25 mg Oral Daily  . enoxaparin (LOVENOX) injection   40 mg Subcutaneous Q24H  . feeding supplement (ENSURE ENLIVE)  237 mL Oral BID BM  . insulin aspart  0-9 Units Subcutaneous TID WC  . insulin aspart  3 Units Subcutaneous TID WC  . insulin glargine  10 Units Subcutaneous QHS  . levETIRAcetam  500 mg Oral BID  . magnesium oxide  400 mg Oral BID  . omega-3 acid ethyl esters  1 g Oral BID  . pravastatin  40 mg Oral Daily  . sodium chloride  3 mL Intravenous Q12H    Assessment/Plan: No further events.  EEG  Unremarkable.  Patient unable to have an MRI.  Although not completely lear on the etiology of the events, it is reasonable to do an antiepileptic trial.  Patient started on Keppra last evening and appears to be tolerating this well.    Recommendations: 1.  Continue Keppra at  500mg  BID 2.  Patient unable to drive, operate heavy machinery, perform activities at heights and participate in water activities until release by outpatient physician.  No further neurologic intervention is recommended at this time.  If further questions arise, please call or page at that time.  Thank you for allowing neurology to participate in the care of this patient.    LOS: 1 day   Thana Farr, MD [facility] [phone] 10/21/2014  10:26 AM

## 2014-10-21 NOTE — Progress Notes (Signed)
Pt sitting in chair, states he is not feeling well after ambulating to the BR to void.  Pt assisted back to bed.  VSS.  EKG shows A-fib/v-paced, HR 56, with PVCs.  Pt states he feels really weak and tired, almost as bad as he felt with prior syncopal episodes at home.  CBG 149.  Tama Gander, NP notified and order for pt only to be OOB with hospital staff assistance.  Bed alarm on.  Pt strongly encouraged not to get OOB without a staff member present.  Pt and wife verbalize understanding.  Alonza Bogus

## 2014-10-21 NOTE — Discharge Summary (Signed)
Physician Discharge Summary   Patient ID: [name] MRN: [id number] DOB/AGE: November 20, 1934 80 y.o.  Admit date: 10/20/2014 Discharge date: 10/21/2014  Primary Care Physician:  [name] Stalling, MD  Discharge Diagnoses:     Seizure  . Syncope and collapse   Orthostatic hypotension  . Permanent atrial fibrillation . Arteriosclerotic cardiovascular disease (ASCVD) . Systolic CHF, chronic . S/P ICD (internal cardiac defibrillator) procedure, 08/17/12, [employer] implanted . Obstructive sleep apnea . Diabetes type 2, uncontrolled . COPD (chronic obstructive pulmonary disease)  Consults:  Cardiology Neurology, Dr. Thad Ranger   Recommendations for Outpatient Follow-up:  Patient was started on Keppra 500 mg twice a day  The ICD interrogation did not reveal any high heart rate episodes of any concern  Patient was recommended no driving, operating heavy machinery or participating in any activities in water or [place] until released by outpatient PCP or neurologist.  Outpatient follow-up was arranged with neurology, Dr. Debarah Crape on 11/08/14  Please note Coreg was decreased to 6.25 mg twice a day due to hypotension  Please note patient was recently discharged on 10/18/14. He had a fall syncope vs TIA and CT scan on 7/10 had shown subdural hematoma. Patient was recommended at that time to resume and liquids one week after per neurology recommendations. He was on Coumadin prior to admission which was held. Patient was recommended to start eliquis one week on 10/26/14 (7 days from prior discharge) after cleared by neurology. Per Dr. Pearlean Brownie, who had followed patient during the previous admission had recommended switching from warfarin to normal anticoagulate and as patient has had trouble with fluctuating INRs.  I have requested [facility] to follow-up patient next week to clarify the anticoagulation, antihypertensives and lasix dosing.   TESTS THAT NEED FOLLOW-UP CBC, BMET   DIET: Heart  healthy diet    Allergies:   Allergies  Allergen Reactions  . Lipitor [Atorvastatin] Other (See Comments)    myalgias   . Percocet [Oxycodone-Acetaminophen] Nausea And Vomiting    Needs to drink milk and take a Zofran  . Procaine Hcl Nausea And Vomiting  . Tramadol Nausea And Vomiting  . Vicodin [Hydrocodone-Acetaminophen] Nausea And Vomiting     Discharge Medications:   Medication List    TAKE these medications        carvedilol 6.25 MG tablet  Commonly known as:  COREG  Take 1 tablet (6.25 mg total) by mouth 2 (two) times daily with a meal.  Start taking on:  10/22/2014     digoxin 0.25 MG tablet  Commonly known as:  LANOXIN  Take 1 tablet (0.25 mg total) by mouth daily.     FISH OIL BURP-LESS 1000 MG Caps  Take 1,200 mg by mouth 2 (two) times daily.     furosemide 40 MG tablet  Commonly known as:  LASIX  Take 0.5 tablets (20 mg total) by mouth daily.  Start taking on:  10/23/2014     levETIRAcetam 500 MG tablet  Commonly known as:  KEPPRA  Take 1 tablet (500 mg total) by mouth 2 (two) times daily.     magnesium oxide 400 MG tablet  Commonly known as:  MAG-OX  Take 400 mg by mouth 2 (two) times daily.     metFORMIN 1000 MG tablet  Commonly known as:  GLUCOPHAGE  Take 1 tablet (1,000 mg total) by mouth 2 (two) times daily with a meal.     pravastatin 40 MG tablet  Commonly known as:  PRAVACHOL  Take 40 mg  by mouth daily.         Brief H and P: For complete details please refer to admission H and P, but in briefDock [name] is a 80 y.o. male, with congestive heart failure (EF of 20-25% in July 2016), atrial fibrillation, ICD in place, type 2 diabetes, COPD and obstructive sleep apnea. He presents with at least 2 similar episodes of neurological change and syncope. He was admitted to [facility] on July 10. He states that after watching the race on 7/9 PM he got up to go to the bathroom. He felt the right side of his face and hand drawing up. He  tried to speak to ask for help but could not make a sound, he fell to the ground and had bowel incontinence. In the hospital he was found to have a subdural hematoma which neurosurgery did not feel warranted intervention. He was seen by neurology and had a negative EEG. They recommended that he follow-up as an outpatient with Dr.[place]qu[place]. Due to the subdural hematoma he was taken off of Coumadin and was supposed to start Eliquis in 1 week after being cleared by neurology. Last evening at approximately midnight he was placing ice in his glass when his right hand and arm began to feel as though it was drawing up again. He tried asked for help but again could not speak. He felt as though he was going to fall so he placed his back against the refrigerator and slumped to the floor. His vision went black and he did not remember hitting the floor. According to his wife he lost consciousness. The patient reported biting the side of his mouth. He denies ever having previous seizures and has never been on anti-seizure medication. In the ER his pulse rate has varied widely from 26-87. His ICD was interrogated and it was learned that the patient had 15 seconds of high radioactivity (pulse greater than 200) at approximately 11:53 PM last night. His CT head was negative for acute change and showed an unchanged appearance of his small bilateral subdural hematomas. Cardiology has seen the patient in the emergency department. They recommend holding Coreg, obtaining carotid Dopplers, placing an event monitor outpatient.  Hospital Course:   Syncope and collapse vs seizure Possibly multifactorial. The patient has A. fib and has a documented episode of rapid ventricular response last night at approximately the same time that his episode took place. Also he describes biting the inside of his mouth, previous bowel incontinence with a similar episode, and a postictal period. Consequently seizure may be a possibility. Both  Cardiology and Neurology were consulted. Cardiology recommended holding Coreg at this point and likely placing an outpatient event monitor.Neurology repeated the EEG which was negative. Per neurology recommendations, patient was started on oral Keppra 500 mg twice a day. Patient was recommended no driving, operating heavy machinery or participating in any activities in water or heights. I have also made an appointment for him with the neurology, Dr. Debarah Crape with [facility] on 11/08/2014. Carotid Dopplers were done which showed 1-39% ICA stenosis bilaterally.  Permanent Afib Cardiology was consulted, patient was seen by Dr Elease Hashimoto prior to discharge and noted that ICD interrogation was okay. Per Dr. Coralee North note for the consultation, patient had high rate episode on the ICD noted for 15 seconds, intermittently. Patient is on beta blocker Coreg, however due to hypotension, not able to increase the dose. Patient will continue digoxin. Recommend outpatient event monitor and possibly EP evaluation outpatient. Patient was  orthostatic at the time of admission, Lasix and Coreg were held. Patient had subdural hematoma from the fall last admission and was discharged on 10/18/14. It was recommended at that time that Coumadin to be stopped as patient has difficulty maintaining INRs and to start NOAC at least 1 week out medically cleared by neurology and fall risk is further assessed. Currently BP is somewhat stable Coreg is restarted however at lower dose 6.25 mg twice a day. Currently not on anticoagulation due to previous SDH.   Chronic systolic heart failure LVEF equals 20%, currently compensated, due to borderline BP, Lasix is currently held for 48 hours.  Hypomagnesemia 1.7 This was replaced with 1 dose of IV magnesium.  Uncontrolled type 2 diabetes Hemoglobin A1c 8.4, patient will resume outpatient regimen.   Hyperlipidemia Continue pravastatin  Obstructive sleep apnea Not on CPap at  home.  COPD Not currently wheezing. Does not appear to be on any home medications. Stable.    Day of Discharge BP 102/53 mmHg  Pulse 71  Temp(Src) 97.9 F (36.6 C) (Oral)  Resp 17  Ht  (1.651 m)  Wt 76.431 kg (168 lb 8 oz)  BMI 28.04 kg/m2  SpO2 99%  Physical Exam: General: Alert and awake oriented x3 not in any acute distress. HEENT: anicteric sclera, pupils reactive to light and accommodation CVS: S1-S2 clear no murmur rubs or gallops Chest: clear to auscultation bilaterally, no wheezing rales or rhonchi Abdomen: soft nontender, nondistended, normal bowel sounds Extremities: no cyanosis, clubbing or edema noted bilaterally Neuro: Cranial nerves II-XII intact, no focal neurological deficits   The results of significant diagnostics from this hospitalization (including imaging, microbiology, ancillary and laboratory) are listed below for reference.    LAB RESULTS: Basic Metabolic Panel:  Recent Labs Lab 10/17/14 0211 10/17/14 2222 10/20/14 0213 10/20/14 1520  NA 140  --  138  --   K 3.5  --  3.5  --   CL 99*  --  100*  --   CO2 32  --  28  --   GLUCOSE 184* 231* 226*  --   BUN 11  --  13  --   CREATININE 1.17  --  1.04  --   CALCIUM 9.0  --  9.4  --   MG  --   --   --  1.7   Liver Function Tests:  Recent Labs Lab 10/17/14 0211  AST 18  ALT 15*  ALKPHOS 50  BILITOT 1.0  PROT 6.1*  ALBUMIN 3.4*   No results for input(s): LIPASE, AMYLASE in the last 168 hours. No results for input(s): AMMONIA in the last 168 hours. CBC:  Recent Labs Lab 10/16/14 0154 10/17/14 0211 10/20/14 0213  WBC 11.3* 8.9 9.5  NEUTROABS 8.6*  --   --   HGB 15.1 15.1 15.5  HCT 43.0 43.8 44.1  MCV 97.1 97.6 96.3  PLT 143* 142* 151   Cardiac Enzymes:  Recent Labs Lab 10/16/14 0552  TROPONINI 0.04*   BNP: Invalid input(s): POCBNP CBG:  Recent Labs Lab 10/21/14 0735 10/21/14 1139  GLUCAP 175* 134*    Significant Diagnostic Studies:  Ct Head Wo  Contrast  10/20/2014   CLINICAL DATA:  Come avulsion at home today. No prior history of seizures. Patient has skull fracture and head bleed after a fall on 10/08/2014.  EXAM: CT HEAD WITHOUT CONTRAST  TECHNIQUE: Contiguous axial images were obtained from the base of the skull through the vertex without intravenous contrast.  COMPARISON:  10/17/2014  FINDINGS: Left frontotemporal parietal subdural hematoma with subacute and chronic features. Maximal depth measures about 7 mm. No associated mass effect or midline shift. Subacute appearing right temporal subdural hematoma measuring about 3 mm depth. No associated mass effect or midline shift.  Diffuse atrophy. No ventricular dilatation. Gray-white matter junctions are distinct. No intraparenchymal or ventricular hemorrhage. Basal cisterns are not effaced. Vascular calcifications. No depressed skull fractures. Visualized paranasal sinuses and left mastoid air cells are not opacified. Opacification of some of the right at mastoid air cells. No significant change since previous study.  IMPRESSION: Unchanged appearance of small bilateral subdural hematomas. No evidence of any progression or rebleed. Diffuse atrophy and small vessel ischemic changes.   Electronically Signed   By: Burman Nieves M.D.   On: 10/20/2014 03:21   Dg Hip Unilat With Pelvis 2-3 Views Right  10/20/2014   CLINICAL DATA:  Right hip and groin pain today after a fall tonight.  EXAM: DG HIP (WITH OR WITHOUT PELVIS) 2-3V RIGHT  COMPARISON:  None.  FINDINGS: There is no evidence of hip fracture or dislocation. There is no evidence of arthropathy or other focal bone abnormality. Vascular calcifications.  IMPRESSION: Negative.   Electronically Signed   By: Burman Nieves M.D.   On: 10/20/2014 05:23    2D ECHO:   Disposition and Follow-up: Discharge Instructions    (HEART FAILURE PATIENTS) Call MD:  Anytime you have any of the following symptoms: 1) 3 pound weight gain in 24 hours or 5 pounds  in 1 week 2) shortness of breath, with or without a dry hacking cough 3) swelling in the hands, feet or stomach 4) if you have to sleep on extra pillows at night in order to breathe.    Complete by:  As directed      Diet Carb Modified    Complete by:  As directed      Discharge instructions    Complete by:  As directed   Please note that you are started on Keppra 500mg  twice a day  No driving, operating any heavy machinery or participating in activities in water or [place] for 6 months or until cleared by your neurologist or primary care physician.  Please take half pill of Lasix and start after 3 days. Please note that Coreg is also decreased now.     Increase activity slowly    Complete by:  As directed             DISPOSITION: Home   DISCHARGE FOLLOW-UP Follow-up Information    Follow up with Levert Feinstein, MD On 11/08/2014.   Specialty:  Neurology   Why:  at 1:00PM. please arrive at 12:30PM    Contact information:   [address] [place] [address] [phone]       Follow up with Nicki Guadalajara A, MD In 1 week.   Specialty:  Cardiology   Why:  office will contact you   Contact information:   [address] [place] [address] [phone]        Time spent on Discharge:  Signed:   Analeah Brame M.D. [facility] 10/21/2014, 2:54 PM Pager: [phone]

## 2014-10-21 NOTE — Progress Notes (Signed)
VASCULAR LAB PRELIMINARY  PRELIMINARY  PRELIMINARY  PRELIMINARY  Carotid duplex  completed.    Preliminary report:  Bilateral:  1-39% ICA stenosis.  Vertebral artery flow is antegrade.      Zaylah Blecha, RVT 10/21/2014, 2:51 PM

## 2014-10-22 DIAGNOSIS — I2510 Atherosclerotic heart disease of native coronary artery without angina pectoris: Secondary | ICD-10-CM | POA: Diagnosis not present

## 2014-10-22 DIAGNOSIS — J439 Emphysema, unspecified: Secondary | ICD-10-CM

## 2014-10-22 DIAGNOSIS — R079 Chest pain, unspecified: Secondary | ICD-10-CM

## 2014-10-22 DIAGNOSIS — R55 Syncope and collapse: Secondary | ICD-10-CM | POA: Diagnosis not present

## 2014-10-22 LAB — GLUCOSE, CAPILLARY
Glucose-Capillary: 134 mg/dL — ABNORMAL HIGH (ref 65–99)
Glucose-Capillary: 131 mg/dL — ABNORMAL HIGH (ref 65–99)
Glucose-Capillary: 205 mg/dL — ABNORMAL HIGH (ref 65–99)

## 2014-10-22 MED ORDER — LEVETIRACETAM 750 MG PO TABS: 750.0000 mg | ORAL_TABLET | Freq: Two times a day (BID) | ORAL | Status: DC

## 2014-10-22 MED ORDER — LEVETIRACETAM 1000 MG PO TABS
1000.0000 mg | ORAL_TABLET | Freq: Two times a day (BID) | ORAL | Status: DC
Start: 2014-10-22 — End: 2014-11-08

## 2014-10-22 MED ORDER — LEVETIRACETAM 500 MG PO TABS
1000.0000 mg | ORAL_TABLET | Freq: Two times a day (BID) | ORAL | Status: DC
  Administered 2014-10-22: 1000 mg via ORAL
  Filled 2014-10-22: qty 2

## 2014-10-22 NOTE — Progress Notes (Signed)
Patient daughter called to ask "where are his medications?". I explained that I was unaware that patient had any home medications with him. There is nothing in the chart for admission about the patient having any home med's with him. This RN called main pharmacy to see if by chance they were down there. Main pharmacy has no record of Patients home med's being sent. This RN called daughter back to update her, and to see if she could double check around the house. Patients daughter states," We gave the med's in a bag to EMS". She also stated that she was in "room 19" in [facility] ED. This RN called the ED to speak to the charge Nurse to see if they by chance had his med's. ED charge Nurse stated that she would "start a hunt for them and get back." This RN notified Charge Nurse on [id number] of situation, and will follow up. This RN called the patients daughter back to update her on the hunt in the ED.

## 2014-10-22 NOTE — Progress Notes (Signed)
Subjective: Transient episode fo right arm weakness/numbness yesterday.   Exam: Filed Vitals:   10/22/14 0727  BP:   Pulse:   Temp: 97.4 F (36.3 C)  Resp:    Gen: In bed, NAD MS: awake, alert, interactive and appropriate HY:QMVHQ, EOMi Motor: MAEW  Sensory:symmetric to LT  Pertinent Labs: LDL 48 Echo, hypokinesis with no clear thrombus Carotid dopplers negative.  A1C 8.4  Impression: 80 yo M With transient episodes of right arm numbness/weakness. The previous combinatino of these symptoms with positive symptoms such as face drawing/tongue biting I think that partial seizure due to his SDH is most likely. If this does represent TIA, he is already on statin, no carotid stenosis. If he is hypoperfusing during these events, this could be a source for these symptoms as well, but BP was normal during event yesterday afternoon. All things being equal I would favor anticoagulation or antiplatelet, but given the recent SDH, would not be more aggressive than the already recommended plan.   Recommendations: 1) increase keppra to 1000mg  bid 2) restart anticoagulation as per previous resommendations.  3) f/u as outpatient with neurology.  4) return to care for any prolonged(> 10 minute) or novel symptoms.    Ritta Slot, MD [facility] [phone]  If 7pm- 7am, please page neurology on call as listed in AMION.

## 2014-10-22 NOTE — Discharge Summary (Signed)
Physician Discharge Summary   Patient ID: [name] [name] MRN: [id number] DOB/AGE: 1935-01-07 80 y.o.  Admit date: 10/20/2014 Discharge date: 10/22/2014  Primary Care Physician:  [name] Stalling, MD  Discharge Diagnoses:     Seizure  . Syncope and collapse   Orthostatic hypotension  . Permanent atrial fibrillation . Arteriosclerotic cardiovascular disease (ASCVD) . Systolic CHF, chronic . S/P ICD (internal cardiac defibrillator) procedure, 08/17/12, [employer] implanted . Obstructive sleep apnea . Diabetes type 2, uncontrolled . COPD (chronic obstructive pulmonary disease)  Consults:  Cardiology Neurology, Dr. Thad [name]   Recommendations for Outpatient Follow-up:  Patient was started on Keppra 1000 mg twice a day  The ICD interrogation did not reveal any high heart rate episodes of any concern  Patient was recommended no driving, operating heavy machinery or participating in any activities in water or [place] until released by outpatient PCP or neurologist.  Outpatient follow-up was arranged with neurology, Dr. Debarah [name] on 11/08/14  Please note Coreg and furosemide are currently on hold due to hypotension.  Please note patient was recently discharged on 10/18/14. He had a fall syncope vs TIA and CT scan on 7/10 had shown subdural hematoma. Patient was recommended at that time to resume and liquids one week after per neurology recommendations. He was on Coumadin prior to admission which was held. Patient was recommended to start eliquis one week on 10/26/14 (7 days from prior discharge) after cleared by neurology. Per Dr. Pearlean [name], who had followed patient during the previous admission had recommended switching from warfarin to normal anticoagulate and as patient has had trouble with fluctuating INRs.  I have requested [facility] care to follow-up patient next week to clarify the anticoagulation, antihypertensives and lasix dosing.   TESTS THAT NEED FOLLOW-UP CBC, BMET   DIET:  Heart healthy diet    Allergies:   Allergies  Allergen Reactions  . Lipitor [Atorvastatin] Other (See Comments)    myalgias   . Percocet [Oxycodone-Acetaminophen] Nausea And Vomiting    Needs to drink milk and take a Zofran  . Procaine Hcl Nausea And Vomiting  . Tramadol Nausea And Vomiting  . Vicodin [Hydrocodone-Acetaminophen] Nausea And Vomiting     Discharge Medications:   Medication List    STOP taking these medications        carvedilol 12.5 MG tablet  Commonly known as:  COREG     furosemide 40 MG tablet  Commonly known as:  LASIX      TAKE these medications        digoxin 0.25 MG tablet  Commonly known as:  LANOXIN  Take 1 tablet (0.25 mg total) by mouth daily.     FISH OIL BURP-LESS 1000 MG Caps  Take 1,200 mg by mouth 2 (two) times daily.     levETIRAcetam 1000 MG tablet  Commonly known as:  KEPPRA  Take 1 tablet (1,000 mg total) by mouth 2 (two) times daily.     magnesium oxide 400 MG tablet  Commonly known as:  MAG-OX  Take 400 mg by mouth 2 (two) times daily.     metFORMIN 1000 MG tablet  Commonly known as:  GLUCOPHAGE  Take 1 tablet (1,000 mg total) by mouth 2 (two) times daily with a meal.     pravastatin 40 MG tablet  Commonly known as:  PRAVACHOL  Take 40 mg by mouth daily.         Brief H and P: For complete details please refer to admission H and P, but in briefDock  [name] is a 80 y.o. male, with congestive heart failure (EF of 20-25% in July 2016), atrial fibrillation, ICD in place, type 2 diabetes, COPD and obstructive sleep apnea. He presents with at least 2 similar episodes of neurological change and syncope. He was admitted to [facility] on July 10. He states that after watching the race on 7/9 PM he got up to go to the bathroom. He felt the right side of his face and hand drawing up. He tried to speak to ask for help but could not make a sound, he fell to the ground and had bowel incontinence. In the hospital he was  found to have a subdural hematoma which neurosurgery did not feel warranted intervention. He was seen by neurology and had a negative EEG. They recommended that he follow-up as an outpatient with [name] [name]. Due to the subdural hematoma he was taken off of Coumadin and was supposed to start Eliquis in 1 week after being cleared by neurology. Last evening at approximately midnight he was placing ice in his glass when his right hand and arm began to feel as though it was drawing up again. He tried asked for help but again could not speak. He felt as though he was going to fall so he placed his back against the refrigerator and slumped to the floor. His vision went black and he did not remember hitting the floor. According to his wife he lost consciousness. The patient reported biting the side of his mouth. He denies ever having previous seizures and has never been on anti-seizure medication. In the ER his pulse rate has varied widely from 26-87. His ICD was interrogated and it was learned that the patient had 15 seconds of high radioactivity (pulse greater than 200) at approximately 11:53 PM last night. His CT head was negative for acute change and showed an unchanged appearance of his small bilateral subdural hematomas. Cardiology has seen the patient in the emergency department. They recommend holding Coreg, obtaining carotid Dopplers, placing an event monitor outpatient.  Hospital Course:   Syncope and collapse vs seizure Possibly multifactorial. The patient has A. fib and has a documented episode of rapid ventricular response last night at approximately the same time that his episode took place. Also he describes biting the inside of his mouth, previous bowel incontinence with a similar episode, and a postictal period. Consequently seizure may be a possibility. Both Cardiology and Neurology were consulted. Cardiology recommended holding Coreg at this point and likely placing an outpatient event  monitor.Neurology repeated the EEG which was negative.  Patient was supposedly being discharged on 7/15 however prior to discharge, he had another spell of transient right arm numbness, weakness and face drawing/tongue biting possibly due to partial seizure. Per Dr. Amada Jupiter, neurology, this could be due to his subdural hematoma or possibility due to hypoperfusion/hypotension. Keppra was increased to 1000 mg twice a day. Repeat CT head was done on 7/15 which showed small bilateral stable hematoma. Patient was recommended no driving, operating heavy machinery or participating in any activities in water or heights. I have also made an appointment for him with the neurology, Dr. Terrace Arabia with [facility] on 11/08/2014. Carotid Dopplers were done which showed 1-39% ICA stenosis bilaterally.  Patient was recommended to return back to ER if his symptoms are prolonged more than 10 minutes. Per Dr. Amada Jupiter, if these episodes represent TIA, would favor anticoagulation or antiplatelets agents however patient had a recent subdural hematoma, would not be more aggressive than  the recommended plan.  Permanent Afib Cardiology was consulted, patient was seen by Dr Elease Hashimoto prior to discharge and noted that ICD interrogation was okay. Per Dr. Coralee North note for the consultation, patient had high rate episode on the ICD noted for 15 seconds, intermittently. Patient is on beta blocker Coreg, however due to hypotension, not able to increase the dose. Patient will continue digoxin. Recommend outpatient event monitor and possibly EP evaluation outpatient. Patient was orthostatic at the time of admission, Lasix and Coreg were held. Patient had subdural hematoma from the fall last admission and was discharged on 10/18/14. It was recommended at that time that Coumadin to be stopped as patient has difficulty maintaining INRs and to start NOAC at least 1 week out medically cleared by neurology and fall risk is further  assessed. Currently not on anticoagulation due to previous SDH. Patient has an appointment with neurology outpatient, I have requested to [facility] heart care to have a earlier appointment next week and to place patient on anticoagulation if appropriate.   Chronic systolic heart failure LVEF equals 20%, currently compensated, due to borderline BP, Lasix is currently held   Hypomagnesemia 1.7 This was replaced with 1 dose of IV magnesium.  Uncontrolled type 2 diabetes Hemoglobin A1c 8.4, patient will resume outpatient regimen.   Hyperlipidemia Continue pravastatin  Obstructive sleep apnea Not on CPap at home.  COPD Not currently wheezing. Does not appear to be on any home medications. Stable.    Day of Discharge BP 106/65 mmHg  Pulse 73  Temp(Src) 97.4 F (36.3 C) (Oral)  Resp 18  Ht  (1.651 m)  Wt 75.8 kg (167 lb 1.7 oz)  BMI 27.81 kg/m2  SpO2 99%  Physical Exam: General: Alert and awake oriented x3 not in any acute distress. HEENT: anicteric sclera, pupils reactive to light and accommodation CVS: S1-S2 clear no murmur rubs or gallops Chest: clear to auscultation bilaterally, no wheezing rales or rhonchi Abdomen: soft nontender, nondistended, normal bowel sounds Extremities: no cyanosis, clubbing or edema noted bilaterally Neuro: Cranial nerves II-XII intact, no focal neurological deficits   The results of significant diagnostics from this hospitalization (including imaging, microbiology, ancillary and laboratory) are listed below for reference.    LAB RESULTS: Basic Metabolic Panel:  Recent Labs Lab 10/17/14 0211 10/17/14 2222 10/20/14 0213 10/20/14 1520  NA 140  --  138  --   K 3.5  --  3.5  --   CL 99*  --  100*  --   CO2 32  --  28  --   GLUCOSE 184* 231* 226*  --   BUN 11  --  13  --   CREATININE 1.17  --  1.04  --   CALCIUM 9.0  --  9.4  --   MG  --   --   --  1.7   Liver Function Tests:  Recent Labs Lab 10/17/14 0211  AST 18  ALT 15*   ALKPHOS 50  BILITOT 1.0  PROT 6.1*  ALBUMIN 3.4*   No results for input(s): LIPASE, AMYLASE in the last 168 hours. No results for input(s): AMMONIA in the last 168 hours. CBC:  Recent Labs Lab 10/16/14 0154 10/17/14 0211 10/20/14 0213  WBC 11.3* 8.9 9.5  NEUTROABS 8.6*  --   --   HGB 15.1 15.1 15.5  HCT 43.0 43.8 44.1  MCV 97.1 97.6 96.3  PLT 143* 142* 151   Cardiac Enzymes:  Recent Labs Lab 10/16/14 0552  TROPONINI 0.04*  BNP: Invalid input(s): POCBNP CBG:  Recent Labs Lab 10/22/14 0549 10/22/14 0726  GLUCAP 134* 131*    Significant Diagnostic Studies:  Ct Head Wo Contrast  10/20/2014   CLINICAL DATA:  Come avulsion at home today. No prior history of seizures. Patient has skull fracture and head bleed after a fall on 10/08/2014.  EXAM: CT HEAD WITHOUT CONTRAST  TECHNIQUE: Contiguous axial images were obtained from the base of the skull through the vertex without intravenous contrast.  COMPARISON:  10/17/2014  FINDINGS: Left frontotemporal parietal subdural hematoma with subacute and chronic features. Maximal depth measures about 7 mm. No associated mass effect or midline shift. Subacute appearing right temporal subdural hematoma measuring about 3 mm depth. No associated mass effect or midline shift.  Diffuse atrophy. No ventricular dilatation. Gray-white matter junctions are distinct. No intraparenchymal or ventricular hemorrhage. Basal cisterns are not effaced. Vascular calcifications. No depressed skull fractures. Visualized paranasal sinuses and left mastoid air cells are not opacified. Opacification of some of the right at mastoid air cells. No significant change since previous study.  IMPRESSION: Unchanged appearance of small bilateral subdural hematomas. No evidence of any progression or rebleed. Diffuse atrophy and small vessel ischemic changes.   Electronically Signed   By: Burman Nieves M.D.   On: 10/20/2014 03:21   Dg Hip Unilat With Pelvis 2-3 Views  Right  10/20/2014   CLINICAL DATA:  Right hip and groin pain today after a fall tonight.  EXAM: DG HIP (WITH OR WITHOUT PELVIS) 2-3V RIGHT  COMPARISON:  None.  FINDINGS: There is no evidence of hip fracture or dislocation. There is no evidence of arthropathy or other focal bone abnormality. Vascular calcifications.  IMPRESSION: Negative.   Electronically Signed   By: Burman Nieves M.D.   On: 10/20/2014 05:23    2D ECHO:   Disposition and Follow-up: Discharge Instructions    (HEART FAILURE PATIENTS) Call MD:  Anytime you have any of the following symptoms: 1) 3 pound weight gain in 24 hours or 5 pounds in 1 week 2) shortness of breath, with or without a dry hacking cough 3) swelling in the hands, feet or stomach 4) if you have to sleep on extra pillows at night in order to breathe.    Complete by:  As directed      Diet Carb Modified    Complete by:  As directed      Discharge instructions    Complete by:  As directed   Please lay flat if you have any episode of dizziness. Please call cardiology office in the morning on Monday to confirm your appointment. You have an appointment with a neurologist for seizures.     Discharge instructions    Complete by:  As directed   No driving, operating any heavy machinery or participating in activities in water or [place] for 6 months or until cleared by your neurologist or primary care physician.     Increase activity slowly    Complete by:  As directed             DISPOSITION: Home   DISCHARGE FOLLOW-UP Follow-up Information    Follow up with Levert Feinstein, MD On 11/08/2014.   Specialty:  Neurology   Why:  at 1:00PM. please arrive at 12:30PM    Contact information:   [address] [place] [address] [phone]       Follow up with Wilburt Finlay, PA-C On 11/08/2014.   Specialties:  Physician Assistant, Radiology, Interventional Cardiology  Why:  at 1:45 pm. Please call on Monday 7/18 to rescheule for earlier appointment. You have  the neurology appointment on the same day.   Contact information:   [address] [place] [address] [phone]        Time spent on Discharge:  Signed:   RAI,RIPUDEEP M.D. [facility] 10/22/2014, 11:50 AM Pager: [phone]

## 2014-10-22 NOTE — Progress Notes (Signed)
Patient AVS as well as all patient education thoroughly discussed. This RN answered all patients questions. Hard script for Keppra given to patient. Patient taken off telemetry, and IV discontinued. Patient discharged home with wife and daughter.

## 2014-10-22 NOTE — Discharge Instructions (Signed)
Nonepileptic Seizures Nonepileptic seizures are seizures that are not caused by abnormal electrical signals in your brain. These seizures often seem like epileptic seizures, but they are not caused by epilepsy.  There are two types of nonepileptic seizures:  A physiologic nonepileptic seizure results from a disruption in your brain.  A psychogenic seizure results from emotional stress. These seizures are sometimes called pseudoseizures. CAUSES  Causes of physiologic nonepileptic seizures include:   Sudden drop in blood pressure.  Low blood sugar.  Low levels of salt (sodium) in your blood.  Low levels of calcium in your blood.  Migraine.  Heart rhythm problems.  Sleep disorders.  Drug and alcohol abuse. Common causes of psychogenic nonepileptic seizures include:  Stress.  Emotional trauma.  Sexual or physical abuse.  Major life events, such as divorce or the death of a loved one.  Mental health disorders, including panic attack and hyperactivity disorder. SIGNS AND SYMPTOMS A nonepileptic seizure can look like an epileptic seizure, including uncontrollable shaking (convulsions), or changes in attention, behavior, or the ability to remain awake and alert. However, there are some differences. Nonepileptic seizures usually:  Do not cause physical injuries.  Start slowly.  Include crying or shrieking.  Last longer than 2 minutes.  Have a short recovery time without headache or exhaustion. DIAGNOSIS  Your health care provider can usually diagnose nonepileptic seizures after taking your medical history and giving you a physical exam. Your health care provider may want to talk to your friends or relatives who have seen you have a seizure.  You may also need to have tests to look for causes of physiologic nonepileptic seizures. This may include an electroencephalogram (EEG), which is a test that measures electrical activity in your brain. If you have had an epileptic  seizure, the results of your EEG will be abnormal. If your health care provider thinks you have had a psychogenic nonepileptic seizure, you may need to see a mental health specialist for an evaluation. TREATMENT  Treatment depends on the type and cause of your seizures.  For physiologic nonepileptic seizures, treatment is aimed at addressing the underlying condition that caused the seizures. These seizures usually stop when the underlying condition is properly treated.  Nonepileptic seizures do not respond to the seizure medicines used to treat epilepsy.  For psychogenic seizures, you may need to work with a mental health specialist. HOME CARE INSTRUCTIONS Home care will depend on the type of nonepileptic seizures you have.   Follow all your health care provider's instructions.  Keep all your follow-up appointments. SEEK MEDICAL CARE IF: You continue to have seizures after treatment. SEEK IMMEDIATE MEDICAL CARE IF:  Your seizures change or become more frequent.  You injure yourself during a seizure.  You have one seizure after another.  You have trouble recovering from a seizure.  You have chest pain or trouble breathing. MAKE SURE YOU:  Understand these instructions.  Will watch your condition.  Will get help right away if you are not doing well or get worse. Document Released: 05/10/2005 Document Revised: 08/09/2013 Document Reviewed: 01/19/2013 [facility] Patient Information 2015 [place], [place]. This information is not intended to replace advice given to you by your health care provider. Make sure you discuss any questions you have with your health care provider.   He had an episode of loss of consciousness. This may be related to your heart. He also need to follow-up with neurology.   Syncope Syncope is a medical term for fainting or passing out. This  means you lose consciousness and drop to the ground. People are generally unconscious for less than 5 minutes. You may  have some muscle twitches for up to 15 seconds before waking up and returning to normal. Syncope occurs more often in older adults, but it can happen to anyone. While most causes of syncope are not dangerous, syncope can be a sign of a serious medical problem. It is important to seek medical care.  CAUSES  Syncope is caused by a sudden drop in blood flow to the brain. The specific cause is often not determined. Factors that can bring on syncope include:  Taking medicines that lower blood pressure.  Sudden changes in posture, such as standing up quickly.  Taking more medicine than prescribed.  Standing in one place for too long.  Seizure disorders.  Dehydration and excessive exposure to heat.  Low blood sugar (hypoglycemia).  Straining to have a bowel movement.  Heart disease, irregular heartbeat, or other circulatory problems.  Fear, emotional distress, seeing blood, or severe pain. SYMPTOMS  Right before fainting, you may:  Feel dizzy or light-headed.  Feel nauseous.  See all white or all black in your field of vision.  Have cold, clammy skin. DIAGNOSIS  Your health care provider will ask about your symptoms, perform a physical exam, and perform an electrocardiogram (ECG) to record the electrical activity of your heart. Your health care provider may also perform other heart or blood tests to determine the cause of your syncope which may include:  Transthoracic echocardiogram (TTE). During echocardiography, sound waves are used to evaluate how blood flows through your heart.  Transesophageal echocardiogram (TEE).  Cardiac monitoring. This allows your health care provider to monitor your heart rate and rhythm in real time.  Holter monitor. This is a portable device that records your heartbeat and can help diagnose heart arrhythmias. It allows your health care provider to track your heart activity for several days, if needed.  Stress tests by exercise or by giving medicine  that makes the heart beat faster. TREATMENT  In most cases, no treatment is needed. Depending on the cause of your syncope, your health care provider may recommend changing or stopping some of your medicines. HOME CARE INSTRUCTIONS  Have someone stay with you until you feel stable.  Do not drive, use machinery, or play sports until your health care provider says it is okay.  Keep all follow-up appointments as directed by your health care provider.  Lie down right away if you start feeling like you might faint. Breathe deeply and steadily. Wait until all the symptoms have passed.  Drink enough fluids to keep your urine clear or pale yellow.  If you are taking blood pressure or heart medicine, get up slowly and take several minutes to sit and then stand. This can reduce dizziness. SEEK IMMEDIATE MEDICAL CARE IF:   You have a severe headache.  You have unusual pain in the chest, abdomen, or back.  You are bleeding from your mouth or rectum, or you have black or tarry stool.  You have an irregular or very fast heartbeat.  You have pain with breathing.  You have repeated fainting or seizure-like jerking during an episode.  You faint when sitting or lying down.  You have confusion.  You have trouble walking.  You have severe weakness.  You have vision problems. If you fainted, call your local emergency services (911 in [place] Do not drive yourself to the hospital.  MAKE SURE YOU:  Understand these  instructions.  Will watch your condition.  Will get help right away if you are not doing well or get worse. Document Released: 03/25/2005 Document Revised: 03/30/2013 Document Reviewed: 05/24/2011 [facility] Patient Information 2015 [place], [place]. This information is not intended to replace advice given to you by your health care provider. Make sure you discuss any questions you have with your health care provider.

## 2014-10-22 NOTE — Progress Notes (Signed)
    SUBJECTIVE:  The patient had another event last night of arm weakness and slurred speech.   The patient was treated with Keppra.  No chest pain.    PHYSICAL EXAM Filed Vitals:   10/22/14 1044 10/22/14 1045 10/22/14 1046 10/22/14 1047  BP:      Pulse: 60 86 62 80  Temp:      TempSrc:      Resp:      Height:      Weight:      SpO2:       General:  No distress Lungs:  Clear Heart:  Irregular Abdomen:  Positive bowel sounds, no rebound no guarding Extremities:  No edema   LABS: Lab Results  Component Value Date   TROPONINI 0.04* 10/16/2014   Results for orders placed or performed during the hospital encounter of 10/20/14 (from the past 24 hour(s))  Glucose, capillary     Status: Abnormal   Collection Time: 10/21/14 11:39 AM  Result Value Ref Range   Glucose-Capillary 134 (H) 65 - 99 mg/dL   Comment 1 Notify RN    Comment 2 Document in Chart   Glucose, capillary     Status: Abnormal   Collection Time: 10/21/14  4:04 PM  Result Value Ref Range   Glucose-Capillary 125 (H) 65 - 99 mg/dL  Glucose, capillary     Status: Abnormal   Collection Time: 10/21/14 10:06 PM  Result Value Ref Range   Glucose-Capillary 239 (H) 65 - 99 mg/dL  Glucose, capillary     Status: Abnormal   Collection Time: 10/22/14  5:49 AM  Result Value Ref Range   Glucose-Capillary 134 (H) 65 - 99 mg/dL  Glucose, capillary     Status: Abnormal   Collection Time: 10/22/14  7:26 AM  Result Value Ref Range   Glucose-Capillary 131 (H) 65 - 99 mg/dL   Comment 1 Notify RN    Comment 2 Document in Chart     Intake/Output Summary (Last 24 hours) at 10/22/14 1104 Last data filed at 10/22/14 0839  Gross per 24 hour  Intake    943 ml  Output    350 ml  Net    593 ml      ASSESSMENT AND PLAN:  ISCHEMIC CARDIOMYOPATHY:  Seems to be euvolemic.  Continue current therapy.   SYNCOPE:   I reviewed all of the strips.  No arrhythmias to explain the events.  He has paced rhythm but the device appears to be  backing up correctly.  No further cardiac work up.  Given subdural the patient is not able to take anticoagulation.  No further work up.    Fayrene Fearing [facility] 10/22/2014 11:04 AM

## 2014-10-23 MED ORDER — MAGNESIUM OXIDE 400 MG PO TABS: 400.0000 mg | ORAL_TABLET | Freq: Two times a day (BID) | ORAL | Status: DC

## 2014-10-23 MED ORDER — DIGOXIN 250 MCG PO TABS: 0.2500 mg | ORAL_TABLET | Freq: Every day | ORAL | Status: DC

## 2014-10-23 MED ORDER — PRAVASTATIN SODIUM 40 MG PO TABS: 40.0000 mg | ORAL_TABLET | Freq: Every day | ORAL | Status: DC

## 2014-10-23 MED ORDER — FISH OIL BURP-LESS 1000 MG PO CAPS: 1200.0000 mg | ORAL_CAPSULE | Freq: Two times a day (BID) | ORAL | Status: DC

## 2014-10-23 MED ORDER — METFORMIN HCL 1000 MG PO TABS: 1000.0000 mg | ORAL_TABLET | Freq: Two times a day (BID) | ORAL | Status: DC

## 2014-10-23 NOTE — Progress Notes (Signed)
Pt's daughter called in regards to pt's home medications being "lost."  She called yesterday after [name] was discharged looking for medications. See note below from [facility]. Called charge nurse in ED to follow up on phone call yesterday evening. She stated she would look for medications but wasn't aware of any being left on the unit.  She informed me that she couldn't locate the medications but would contact the staff members who took care of the [name] during his stay in the ED to see if they knew where the medications would be. I spoke with security who also stated they would try to locate [name]'s medications. I spoke with Dr. Isidoro Donning who discharged the [name] on 7/16, and she called in all necessary prescriptions for the [name] to the pharmacy of his choice. I called [name]'s sister [name] as well as the [name] and let them know the medications were called in and would be ready for pickup this afternoon.

## 2014-10-24 ENCOUNTER — Encounter: Payer: Self-pay | Admitting: Cardiology

## 2014-11-07 ENCOUNTER — Encounter: Payer: Self-pay | Admitting: Cardiovascular Disease

## 2014-11-08 ENCOUNTER — Ambulatory Visit (INDEPENDENT_AMBULATORY_CARE_PROVIDER_SITE_OTHER): Payer: Medicare HMO | Admitting: Neurology

## 2014-11-08 ENCOUNTER — Ambulatory Visit: Payer: Medicare HMO | Admitting: Physician Assistant

## 2014-11-08 ENCOUNTER — Encounter: Payer: Self-pay | Admitting: Neurology

## 2014-11-08 VITALS — BP 97/64 | HR 57 | Ht 65.0 in | Wt 172.0 lb

## 2014-11-08 DIAGNOSIS — G4700 Insomnia, unspecified: Secondary | ICD-10-CM

## 2014-11-08 DIAGNOSIS — G40209 Localization-related (focal) (partial) symptomatic epilepsy and epileptic syndromes with complex partial seizures, not intractable, without status epilepticus: Secondary | ICD-10-CM

## 2014-11-08 DIAGNOSIS — I609 Nontraumatic subarachnoid hemorrhage, unspecified: Secondary | ICD-10-CM | POA: Diagnosis not present

## 2014-11-08 MED ORDER — CLONAZEPAM 0.5 MG PO TABS: 0.5000 mg | ORAL_TABLET | Freq: Every day | ORAL | Status: DC

## 2014-11-08 MED ORDER — LEVETIRACETAM 1000 MG PO TABS: 1000.0000 mg | ORAL_TABLET | Freq: Two times a day (BID) | ORAL | Status: DC

## 2014-11-08 NOTE — Progress Notes (Addendum)
PATIENT: [name] DOB: 01/13/1935  Chief Complaint  Patient presents with  . Seizure-like activity    He is here with his wife, [name] and his daughter, [name]. He has had multiple episodes of loss of consciousness.  He has recently suffered a subdural hematoma.     HISTORICAL  [name] 80 yo RH, wife, daughter, , seen in refer by his primary care physician [name] for seizure, he is accompanied by his wife, and daughter [name] at today's clinical visit  He had past medical history of hypertension, hyperlipidemia, diabetes, coronary artery disease, status post stent, atrial fibrillation, was taking coumadin, stopped in June 30th 2016, not on anticoagulation at this point,  He also had a history of ICD, not MRI candidate, ischemic congestive heart failure, ejection fraction 25-30%.  In October 06 2014, he fell backwards sitting the chair, with transient loss of consciousness for a few minutes, later he was noted by family member to have confusion, could not recognize his friend, he was taken by ambulance to [facility] in October 06 2014, I have reviewed at CAT scan of the brain, mild generalized atrophy, more at bilateral frontal region, no acute intracranial abnormality. He also suffered left fifth rib fracture by chest x-ray.  He was discharged home, in July second 2016, he began to complains of worsening diffuse headaches, presented to local emergency room, I have reviewed CAT scan in July second 2016, there was diffuse bilateral frontal atrophy, no acute lesions.   In July third 2016, while watching TV, he had his first seizure, eyes rolled back, right hand drawn up followed by loss of consciousness tonic-clonic movement.   He had recurrent seizure in October 16 2014, again presented to the emergency room, repeat CAT scan in October 16 2014 showed mixed the density subdural hematoma on the left, 6 mm in the left frontal region.  5 mm right frontal subdural  hematoma. EEG was normal October 17 2014 Due to subdural hematoma, Coumadin was stopped, the plan was to start Eliquis in one wee.  Most recent seizure was October 20 2014, started from his right hand, difficulty controlling his right hand, followed by loss of consciousness, right hand arm contraction, post event confusion lasted 10-15 minutes, he is now taking Keppra 1000 mg twice a day, tolerating it well, he has no recurrent seizure  REVIEW OF SYSTEMS: Full 14 system review of systems performed and notable only for insomnia,  ALLERGIES: Allergies  Allergen Reactions  . Lipitor [Atorvastatin] Other (See Comments)    myalgias   . Percocet [Oxycodone-Acetaminophen] Nausea And Vomiting    Needs to drink milk and take a Zofran  . Procaine Hcl Nausea And Vomiting  . Tramadol Nausea And Vomiting  . Vicodin [Hydrocodone-Acetaminophen] Nausea And Vomiting    HOME MEDICATIONS: Current Outpatient Prescriptions  Medication Sig Dispense Refill  . digoxin (LANOXIN) 0.25 MG tablet Take 1 tablet (0.25 mg total) by mouth daily. 30 tablet 3  . levETIRAcetam (KEPPRA) 1000 MG tablet Take 1 tablet (1,000 mg total) by mouth 2 (two) times daily. 60 tablet 3  . magnesium oxide (MAG-OX) 400 MG tablet Take 1 tablet (400 mg total) by mouth 2 (two) times daily. 60 tablet 2  . metFORMIN (GLUCOPHAGE) 1000 MG tablet Take 1 tablet (1,000 mg total) by mouth 2 (two) times daily with a meal. 60 tablet 3  . pravastatin (PRAVACHOL) 40 MG tablet Take 1 tablet (40 mg total) by mouth daily. 30 tablet 3  PAST MEDICAL HISTORY: Past Medical History  Diagnosis Date  . Arteriosclerotic cardiovascular disease (ASCVD)   . COPD (chronic obstructive pulmonary disease)   . Gout   . DJD (degenerative joint disease)   . Hyperlipidemia   . Chronic anticoagulation 2012    2012  . CHF (congestive heart failure)   . Atrial fibrillation     Onset in 2012  . Atrial flutter   . ICD (implantable cardiac defibrillator) in place    . Myocardial infarction 1998  . Ischemic cardiomyopathy   . Obstructive sleep apnea     "went away when I lost a bunch of weight" (08/17/2012)  . Type II diabetes mellitus   . Kidney stone     "just once" (08/17/2012)  . NICM (nonischemic cardiomyopathy), EF 25-30% 09-10-12  . At risk for sudden cardiac death 2012-09-10  . Permanent atrial fibrillation 02/15/2011    Initial onset in 03/2011 with rapid ventricular response   . S/P ICD (internal cardiac defibrillator) procedure, 08/17/12, [employer] 09/10/2012    [employer]    PAST SURGICAL HISTORY: Past Surgical History  Procedure Laterality Date  . Cholecystectomy  2009  . Knee arthroplasty Left 1978    "tendon & cartilege repair" (08/17/2012)  . Vasectomy  ~ 1964  . Cystoscopy/retrograde/ureteroscopy  06/28/2011    Procedure: CYSTOSCOPY/RETROGRADE/URETEROSCOPY;  Surgeon: Ky Barban, MD;  Location: [facility];  Service: Urology;  Laterality: Right;  . Stone extraction with basket  06/28/2011    Procedure: STONE EXTRACTION WITH BASKET;  Surgeon: Ky Barban, MD;  Location: [facility];  Service: Urology;  Laterality: Right;  specimen given to family per MD  . Cardiac defibrillator placement  08/17/2012    [employer]  . Coronary angioplasty with stent placement  07/14/1996    "1" (08/17/2012)  . Cataract extraction w/ intraocular lens  implant, bilateral Bilateral ~ 2011  . US echocardiography  07/08/2012    EF <20%,mild MR,TR,LA severely dilated  . Myoview perfusion scan  06/02/2012    low risk, extensive scar entire LAD & RCA territory  . S/p icd  08/2012    [employer]  . Implantable cardioverter defibrillator implant N/A 08/17/2012    Procedure: IMPLANTABLE CARDIOVERTER DEFIBRILLATOR IMPLANT;  Surgeon: Thurmon Fair, MD;  Location: [facility] CATH LAB;  Service: Cardiovascular;  Laterality: N/A;  . Cardiac catheterization  02/2003    FAMILY HISTORY: Family History  Problem Relation Age of Onset  . Heart failure Mother    . Heart failure Father     SOCIAL HISTORY:  History   Social History  . Marital Status: Married    Spouse Name: N/A  . Number of Children: 3  . Years of Education: 9th   Occupational History  . Retired    Social History Main Topics  . Smoking status: Former Smoker -- 2.00 packs/day for 40 years    Types: Cigarettes    Quit date: 04/08/1992  . Smokeless tobacco: Former Neurosurgeon  . Alcohol Use: No     Comment: 08/17/2012 "quit drinking in 1983"  . Drug Use: No  . Sexual Activity: No   Other Topics Concern  . Not on file   Social History Narrative   Lives at home with his wife.   Right-handed.   No caffeine use.   PHYSICAL EXAM   Filed Vitals:   11/08/14 1318  BP: 97/64  Pulse: 57  Height: 5\' 5"  (1.651 m)  Weight: 172 lb (78.019 kg)    Not recorded  Body mass index is 28.62 kg/(m^2).  PHYSICAL EXAMNIATION:  Gen: NAD, conversant, well nourised, obese, well groomed                     Cardiovascular: Regular rate rhythm, no peripheral edema, warm, nontender. Eyes: Conjunctivae clear without exudates or hemorrhage Neck: Supple, no carotid bruise. Pulmonary: Clear to auscultation bilaterally   NEUROLOGICAL EXAM:  MENTAL STATUS: Speech:    Speech is normal; fluent and spontaneous with normal comprehension.  Cognition:     Orientation to time, place and person     Normal recent and remote memory     Normal Attention span and concentration     Normal Language, naming, repeating,spontaneous speech     Fund of knowledge   CRANIAL NERVES: CN II: Visual fields are full to confrontation. Fundoscopic exam is normal with sharp discs and no vascular changes. Pupils are round equal and briskly reactive to light. CN III, IV, VI: extraocular movement are normal. No ptosis. CN V: Facial sensation is intact to pinprick in all 3 divisions bilaterally. Corneal responses are intact.  CN VII: Face is symmetric with normal eye closure and smile. CN VIII: Hearing is  normal to rubbing fingers CN IX, X: Palate elevates symmetrically. Phonation is normal. CN XI: Head turning and shoulder shrug are intact CN XII: Tongue is midline with normal movements and no atrophy.  MOTOR: There is no pronator drift of out-stretched arms. Muscle bulk and tone are normal. Muscle strength is normal.  REFLEXES: Reflexes are 2+ and symmetric at the biceps, triceps, knees, and ankles. Plantar responses are flexor.  SENSORY: Intact to light touch, pinprick, position sense, and vibration sense are intact in fingers and toes.  COORDINATION: Rapid alternating movements and fine finger movements are intact. There is no dysmetria on finger-to-nose and heel-knee-shin.    GAIT/STANCE: Posture is normal. Gait is steady with normal steps, base, arm swing, and turning. Heel and toe walking are normal. Mild difficulty with tandem walking  DIAGNOSTIC DATA (LABS, IMAGING, TESTING) - I reviewed patient records, labs, notes, testing and imaging myself where available.   ASSESSMENT AND PLAN  RATHANA VIVEROS is a 80 y.o. male  is complicated past medical history, history of atrial fibrillation, was on chronic Coumadin treatment, ischemic congestive heart failure, ejection fraction 25-30%  Subdural hemorrhage:   Coumadin has been on hold since October 16 2014, he only has small amount of left frontal subdural hematoma, may consider restart anticoagulation at his next cardiology  evaluation in November 21 2014, with his chronic atrial fibrillation, if he has worsening neurological symptoms, such as confusion, seizure, worsening headaches, he should  present to her local emergency room for repeat CAT scan Complex partial seizure:   Doing well at current dose of Keppra 1000 mg twice a day  No driving until seizure free for 6 months Insomnia:  Clonazepam 0.5 milligrams as needed     Terrace Arabia, M.D. Ph.D.  [facility] [address] [place], [place] [address] Ph: [phone] Fax: [phone]  CC: To Dr. Oval [name]

## 2014-11-13 ENCOUNTER — Encounter (HOSPITAL_COMMUNITY): Payer: Self-pay | Admitting: Emergency Medicine

## 2014-11-13 ENCOUNTER — Emergency Department (HOSPITAL_COMMUNITY): Payer: Medicare HMO

## 2014-11-13 ENCOUNTER — Emergency Department (HOSPITAL_COMMUNITY)
Admission: EM | Admit: 2014-11-13 | Discharge: 2014-11-13 | Disposition: A | Discharge status: Home / Self Care | Payer: Medicare HMO | Attending: Emergency Medicine | Admitting: Emergency Medicine

## 2014-11-13 DIAGNOSIS — I252 Old myocardial infarction: Secondary | ICD-10-CM | POA: Diagnosis not present

## 2014-11-13 DIAGNOSIS — Z79899 Other long term (current) drug therapy: Secondary | ICD-10-CM | POA: Diagnosis not present

## 2014-11-13 DIAGNOSIS — E119 Type 2 diabetes mellitus without complications: Secondary | ICD-10-CM | POA: Insufficient documentation

## 2014-11-13 DIAGNOSIS — Z7901 Long term (current) use of anticoagulants: Secondary | ICD-10-CM | POA: Diagnosis not present

## 2014-11-13 DIAGNOSIS — R5383 Other fatigue: Secondary | ICD-10-CM | POA: Diagnosis present

## 2014-11-13 DIAGNOSIS — Z8669 Personal history of other diseases of the nervous system and sense organs: Secondary | ICD-10-CM | POA: Insufficient documentation

## 2014-11-13 DIAGNOSIS — E785 Hyperlipidemia, unspecified: Secondary | ICD-10-CM | POA: Insufficient documentation

## 2014-11-13 DIAGNOSIS — R001 Bradycardia, unspecified: Secondary | ICD-10-CM | POA: Insufficient documentation

## 2014-11-13 DIAGNOSIS — Z87891 Personal history of nicotine dependence: Secondary | ICD-10-CM | POA: Diagnosis not present

## 2014-11-13 DIAGNOSIS — Z87442 Personal history of urinary calculi: Secondary | ICD-10-CM | POA: Insufficient documentation

## 2014-11-13 DIAGNOSIS — I509 Heart failure, unspecified: Secondary | ICD-10-CM | POA: Insufficient documentation

## 2014-11-13 DIAGNOSIS — R531 Weakness: Secondary | ICD-10-CM | POA: Insufficient documentation

## 2014-11-13 DIAGNOSIS — J449 Chronic obstructive pulmonary disease, unspecified: Secondary | ICD-10-CM | POA: Insufficient documentation

## 2014-11-13 DIAGNOSIS — Z8739 Personal history of other diseases of the musculoskeletal system and connective tissue: Secondary | ICD-10-CM | POA: Diagnosis not present

## 2014-11-13 DIAGNOSIS — Z95810 Presence of automatic (implantable) cardiac defibrillator: Secondary | ICD-10-CM | POA: Insufficient documentation

## 2014-11-13 HISTORY — DX: Nontraumatic subarachnoid hemorrhage, unspecified: I60.9

## 2014-11-13 LAB — URINALYSIS, ROUTINE W REFLEX MICROSCOPIC
Bilirubin Urine: NEGATIVE
Glucose, UA: NEGATIVE mg/dL
KETONES UR: NEGATIVE mg/dL
Leukocytes, UA: NEGATIVE
Nitrite: NEGATIVE
PROTEIN: NEGATIVE mg/dL
Specific Gravity, Urine: 1.025 (ref 1.005–1.030)
UROBILINOGEN UA: 0.2 mg/dL (ref 0.0–1.0)
pH: 5.5 (ref 5.0–8.0)

## 2014-11-13 LAB — CBC WITH DIFFERENTIAL/PLATELET
Basophils Absolute: 0 10*3/uL (ref 0.0–0.1)
Basophils Relative: 0 % (ref 0–1)
EOS ABS: 0.2 10*3/uL (ref 0.0–0.7)
Eosinophils Relative: 2 % (ref 0–5)
HCT: 47.9 % (ref 39.0–52.0)
Hemoglobin: 16.3 g/dL (ref 13.0–17.0)
Lymphocytes Relative: 42 % (ref 12–46)
Lymphs Abs: 3.7 10*3/uL (ref 0.7–4.0)
MCH: 34.4 pg — AB (ref 26.0–34.0)
MCHC: 34 g/dL (ref 30.0–36.0)
MCV: 101.1 fL — AB (ref 78.0–100.0)
MONOS PCT: 8 % (ref 3–12)
Monocytes Absolute: 0.7 10*3/uL (ref 0.1–1.0)
NEUTROS ABS: 4.3 10*3/uL (ref 1.7–7.7)
NEUTROS PCT: 48 % (ref 43–77)
Platelets: 141 10*3/uL — ABNORMAL LOW (ref 150–400)
RBC: 4.74 MIL/uL (ref 4.22–5.81)
RDW: 13.5 % (ref 11.5–15.5)
WBC: 8.9 10*3/uL (ref 4.0–10.5)

## 2014-11-13 LAB — COMPREHENSIVE METABOLIC PANEL
ALBUMIN: 4.1 g/dL (ref 3.5–5.0)
ALT: 23 U/L (ref 17–63)
ANION GAP: 11 (ref 5–15)
AST: 28 U/L (ref 15–41)
Alkaline Phosphatase: 62 U/L (ref 38–126)
BUN: 11 mg/dL (ref 6–20)
CO2: 29 mmol/L (ref 22–32)
Calcium: 9.6 mg/dL (ref 8.9–10.3)
Chloride: 100 mmol/L — ABNORMAL LOW (ref 101–111)
Creatinine, Ser: 0.98 mg/dL (ref 0.61–1.24)
GFR calc Af Amer: >60 mL/min (ref >60)
GFR calc non Af Amer: >60 mL/min (ref >60)
Glucose, Bld: 141 mg/dL — ABNORMAL HIGH (ref 65–99)
POTASSIUM: 4.2 mmol/L (ref 3.5–5.1)
SODIUM: 140 mmol/L (ref 135–145)
Total Bilirubin: 0.8 mg/dL (ref 0.3–1.2)
Total Protein: 6.8 g/dL (ref 6.5–8.1)

## 2014-11-13 LAB — PROTIME-INR
INR: 1.08 (ref 0.00–1.49)
PROTHROMBIN TIME: 14.2 s (ref 11.6–15.2)

## 2014-11-13 LAB — BRAIN NATRIURETIC PEPTIDE: B Natriuretic Peptide: 162 pg/mL — ABNORMAL HIGH (ref 0.0–100.0)

## 2014-11-13 LAB — I-STAT TROPONIN, ED: Troponin i, poc: 0.02 ng/mL (ref 0.00–0.08)

## 2014-11-13 LAB — URINE MICROSCOPIC-ADD ON

## 2014-11-13 LAB — DIGOXIN LEVEL: Digoxin Level: 1.3 ng/mL (ref 0.8–2.0)

## 2014-11-13 NOTE — ED Notes (Signed)
Pt to imaging

## 2014-11-13 NOTE — ED Notes (Signed)
MD at bedside. 

## 2014-11-13 NOTE — Discharge Instructions (Signed)
Your sleep medicine (clonazepam) may be making you feel tired and off balance.  Do not take it the next two nights.  When you start taking it again take half a tablet at night time as needed for sleep.     Fatigue Fatigue is a feeling of tiredness, lack of energy, lack of motivation, or feeling tired all the time. Having enough rest, good nutrition, and reducing stress will normally reduce fatigue. Consult your caregiver if it persists. The nature of your fatigue will help your caregiver to find out its cause. The treatment is based on the cause.  CAUSES  There are many causes for fatigue. Most of the time, fatigue can be traced to one or more of your habits or routines. Most causes fit into one or more of three general areas. They are: Lifestyle problems  Sleep disturbances.  Overwork.  Physical exertion.  Unhealthy habits.  Poor eating habits or eating disorders.  Alcohol and/or drug use .  Lack of proper nutrition (malnutrition). Psychological problems  Stress and/or anxiety problems.  Depression.  Grief.  Boredom. Medical Problems or Conditions  Anemia.  Pregnancy.  Thyroid gland problems.  Recovery from major surgery.  Continuous pain.  Emphysema or asthma that is not well controlled  Allergic conditions.  Diabetes.  Infections (such as mononucleosis).  Obesity.  Sleep disorders, such as sleep apnea.  Heart failure or other heart-related problems.  Cancer.  Kidney disease.  Liver disease.  Effects of certain medicines such as antihistamines, cough and cold remedies, prescription pain medicines, heart and blood pressure medicines, drugs used for treatment of cancer, and some antidepressants. SYMPTOMS  The symptoms of fatigue include:   Lack of energy.  Lack of drive (motivation).  Drowsiness.  Feeling of indifference to the surroundings. DIAGNOSIS  The details of how you feel help guide your caregiver in finding out what is causing the  fatigue. You will be asked about your present and past health condition. It is important to review all medicines that you take, including prescription and non-prescription items. A thorough exam will be done. You will be questioned about your feelings, habits, and normal lifestyle. Your caregiver may suggest blood tests, urine tests, or other tests to look for common medical causes of fatigue.  TREATMENT  Fatigue is treated by correcting the underlying cause. For example, if you have continuous pain or depression, treating these causes will improve how you feel. Similarly, adjusting the dose of certain medicines will help in reducing fatigue.  HOME CARE INSTRUCTIONS   Try to get the required amount of good sleep every night.  Eat a healthy and nutritious diet, and drink enough water throughout the day.  Practice ways of relaxing (including yoga or meditation).  Exercise regularly.  Make plans to change situations that cause stress. Act on those plans so that stresses decrease over time. Keep your work and personal routine reasonable.  Avoid street drugs and minimize use of alcohol.  Start taking a daily multivitamin after consulting your caregiver. SEEK MEDICAL CARE IF:   You have persistent tiredness, which cannot be accounted for.  You have fever.  You have unintentional weight loss.  You have headaches.  You have disturbed sleep throughout the night.  You are feeling sad.  You have constipation.  You have dry skin.  You have gained weight.  You are taking any new or different medicines that you suspect are causing fatigue.  You are unable to sleep at night.  You develop any unusual swelling  of your legs or other parts of your body. SEEK IMMEDIATE MEDICAL CARE IF:   You are feeling confused.  Your vision is blurred.  You feel faint or pass out.  You develop severe headache.  You develop severe abdominal, pelvic, or back pain.  You develop chest pain, shortness  of breath, or an irregular or fast heartbeat.  You are unable to pass a normal amount of urine.  You develop abnormal bleeding such as bleeding from the rectum or you vomit blood.  You have thoughts about harming yourself or committing suicide.  You are worried that you might harm someone else. MAKE SURE YOU:   Understand these instructions.  Will watch your condition.  Will get help right away if you are not doing well or get worse. Document Released: 01/20/2007 Document Revised: 06/17/2011 Document Reviewed: 07/27/2013 [facility] Patient Information 2015 [place], [place]. This information is not intended to replace advice given to you by your health care provider. Make sure you discuss any questions you have with your health care provider.

## 2014-11-13 NOTE — ED Notes (Signed)
Pt reports generalized weakness since this morning and c/o "my heart not going, they say i have a low heartrate." Pt alert and oriented, denies c/p.  Pt denies sob.  Pt reports "my heart aint working and my defibrillator aint working worth a [employer]."

## 2014-11-13 NOTE — ED Notes (Signed)
Pt made aware to return if symptoms worsen or if any life threatening symptoms occur.   

## 2014-11-13 NOTE — ED Provider Notes (Signed)
CSN: [id number]     Arrival date & time 11/13/14  0850 History  This chart was scribed for Tilden Fossa, MD by Murriel Hopper, ED Scribe. This patient was seen in room APA02/APA02 and the patient's care was started at 9:05 AM.    Chief Complaint  Patient presents with  . Fatigue     The history is provided by the patient and a relative. No language interpreter was used.   HPI Comments: [name] is a 80 y.o. male who presents to the Emergency Department complaining of constant, worsening generalized weakness with an associated irregular heart rate that has been present since earlier this morning. Pt states, "I'm not worth a damn. I can't stand up or walk good". . Pt denies chest pain, SOB, fever, cough, abdominal pain, diarrhea, dysuria. Patient describes his symptoms as feeling as if he is drunk. He's had nausea.  He denies any focal weakness or numbness. Symptoms are moderate, constant, worsening. He started a sleeping pill, Clonopin, a week ago.    Past Medical History  Diagnosis Date  . Arteriosclerotic cardiovascular disease (ASCVD)   . COPD (chronic obstructive pulmonary disease)   . Gout   . DJD (degenerative joint disease)   . Hyperlipidemia   . Chronic anticoagulation 2012    2012  . CHF (congestive heart failure)   . Atrial fibrillation     Onset in 2012  . Atrial flutter   . ICD (implantable cardiac defibrillator) in place   . Myocardial infarction 1998  . Ischemic cardiomyopathy   . Obstructive sleep apnea     "went away when I lost a bunch of weight" (08/17/2012)  . Type II diabetes mellitus   . Kidney stone     "just once" (08/17/2012)  . NICM (nonischemic cardiomyopathy), EF 25-30% 08-Sep-2012  . At risk for sudden cardiac death 2012-09-08  . Permanent atrial fibrillation 02/15/2011    Initial onset in 03/2011 with rapid ventricular response   . S/P ICD (internal cardiac defibrillator) procedure, 08/17/12, [employer] 09-08-2012    [employer]  .  Bleeding in brain due to brain aneurysm October 06 2014   Past Surgical History  Procedure Laterality Date  . Cholecystectomy  2009  . Knee arthroplasty Left 1978    "tendon & cartilege repair" (08/17/2012)  . Vasectomy  ~ 1964  . Cystoscopy/retrograde/ureteroscopy  06/28/2011    Procedure: CYSTOSCOPY/RETROGRADE/URETEROSCOPY;  Surgeon: Ky Barban, MD;  Location: [facility];  Service: Urology;  Laterality: Right;  . Stone extraction with basket  06/28/2011    Procedure: STONE EXTRACTION WITH BASKET;  Surgeon: Ky Barban, MD;  Location: [facility];  Service: Urology;  Laterality: Right;  specimen given to family per MD  . Cardiac defibrillator placement  08/17/2012    [employer]  . Coronary angioplasty with stent placement  07/14/1996    "1" (08/17/2012)  . Cataract extraction w/ intraocular lens  implant, bilateral Bilateral ~ 2011  . US echocardiography  07/08/2012    EF <20%,mild MR,TR,LA severely dilated  . Myoview perfusion scan  06/02/2012    low risk, extensive scar entire LAD & RCA territory  . S/p icd  08/2012    [employer]  . Implantable cardioverter defibrillator implant N/A 08/17/2012    Procedure: IMPLANTABLE CARDIOVERTER DEFIBRILLATOR IMPLANT;  Surgeon: Thurmon Fair, MD;  Location: [facility];  Service: Cardiovascular;  Laterality: N/A;  . Cardiac catheterization  02/2003   Family History  Problem Relation Age of Onset  . Heart failure  Mother   . Heart failure Father    History  Substance Use Topics  . Smoking status: Former Smoker -- 2.00 packs/day for 40 years    Types: Cigarettes    Quit date: 04/08/1992  . Smokeless tobacco: Former Neurosurgeon  . Alcohol Use: No     Comment: 08/17/2012 "quit drinking in 1983"    Review of Systems  Constitutional: Negative for fever.  Respiratory: Negative for cough and shortness of breath.   Cardiovascular: Positive for palpitations. Negative for chest pain.  Gastrointestinal: Negative for abdominal pain and diarrhea.   Genitourinary: Negative for dysuria.  Neurological: Positive for weakness.  All other systems reviewed and are negative.     Allergies  Lipitor; Percocet; Procaine hcl; Tramadol; and Vicodin  Home Medications   Prior to Admission medications   Medication Sig Start Date End Date Taking? Authorizing Provider  clonazePAM (KLONOPIN) 0.5 MG tablet Take 1 tablet (0.5 mg total) by mouth at bedtime. 11/08/14   Levert Feinstein, MD  digoxin (LANOXIN) 0.25 MG tablet Take 1 tablet (0.25 mg total) by mouth daily. 10/23/14 09/20/17  Ripudeep Jenna Luo, MD  levETIRAcetam (KEPPRA) 1000 MG tablet Take 1 tablet (1,000 mg total) by mouth 2 (two) times daily. 11/08/14   Levert Feinstein, MD  magnesium oxide (MAG-OX) 400 MG tablet Take 1 tablet (400 mg total) by mouth 2 (two) times daily. 10/23/14   Ripudeep Jenna Luo, MD  metFORMIN (GLUCOPHAGE) 1000 MG tablet Take 1 tablet (1,000 mg total) by mouth 2 (two) times daily with a meal. 10/23/14   Ripudeep K Rai, MD  pravastatin (PRAVACHOL) 40 MG tablet Take 1 tablet (40 mg total) by mouth daily. 10/23/14   Ripudeep Jenna Luo, MD   There were no vitals taken for this visit. Physical Exam  Constitutional: He is oriented to person, place, and time. He appears well-developed and well-nourished.  HENT:  Head: Normocephalic and atraumatic.  Eyes: EOM are normal. Pupils are equal, round, and reactive to light.  Cardiovascular:  No murmur heard. Bradycardic and irregular  Pulmonary/Chest: Effort normal. No respiratory distress.  faint crackles in bilateral lateral bases  Abdominal: Soft. There is no tenderness. There is no rebound and no guarding.  Musculoskeletal: He exhibits no edema or tenderness.  Neurological: He is alert and oriented to person, place, and time. No cranial nerve deficit. Coordination normal.  5/5 strength in all four extremities.  Sensation to light touch intact in all four extremities.    Skin: Skin is warm and dry.  Psychiatric: He has a normal mood and affect. His  behavior is normal.  Nursing note and vitals reviewed.   ED Course  Procedures (including critical care time)  DIAGNOSTIC STUDIES: Oxygen Saturation is 100% on room air, normal by my interpretation.    COORDINATION OF CARE: 9:13 AM Discussed treatment plan with pt at bedside and pt agreed to plan.   Labs Review Labs Reviewed  COMPREHENSIVE METABOLIC PANEL - Abnormal; Notable for the following:    Chloride 100 (*)    Glucose, Bld 141 (*)    All other components within normal limits  CBC WITH DIFFERENTIAL/PLATELET - Abnormal; Notable for the following:    MCV 101.1 (*)    MCH 34.4 (*)    Platelets 141 (*)    All other components within normal limits  URINALYSIS, ROUTINE W REFLEX MICROSCOPIC (NOT AT [facility]) - Abnormal; Notable for the following:    Hgb urine dipstick MODERATE (*)    All other components within normal limits  BRAIN NATRIURETIC PEPTIDE - Abnormal; Notable for the following:    B Natriuretic Peptide 162.0 (*)    All other components within normal limits  URINE CULTURE  DIGOXIN LEVEL  PROTIME-INR  URINE MICROSCOPIC-ADD ON  Rosezena Sensor, ED    Imaging Review Dg Chest 2 View  11/13/2014   CLINICAL DATA:  Weakness and dizziness.  EXAM: CHEST  2 VIEW  COMPARISON:  10/16/2014  FINDINGS: Mild enlargement of the cardiopericardial silhouette. No mediastinal or hilar masses or evidence of adenopathy.  Single lead left anterior chest wall pacemaker is stable and well positioned.  Clear lungs.  No pleural effusion or pneumothorax.  Bony thorax is demineralized but grossly intact.  IMPRESSION: No acute cardiopulmonary disease.   Electronically Signed   By: Amie Portland M.D.   On: 11/13/2014 09:55   Ct Head Wo Contrast  11/13/2014   CLINICAL DATA:  Worsening generalized weakness beginning this am. Pt has hx of "bleeding in brain due to aneurysm 10/06/14" listed in history. Also hx of ASCVD,COPD,CHF  EXAM: CT HEAD WITHOUT CONTRAST  TECHNIQUE: Contiguous axial images were  obtained from the base of the skull through the vertex without intravenous contrast.  COMPARISON:  10/21/2014  FINDINGS: Ventricles are normal configuration. There is sulcal enlargement consistent with mild generalized atrophy, stable. There are no parenchymal masses or mass effect. There is no evidence of a cortical infarct. There are no extra-axial masses. Widened extra-axial spaces are noted over the frontal lobes, left greater than right, stable when compared to prior studies.  There is no intracranial hemorrhage.  Visualized sinuses and mastoid air cells are clear.  IMPRESSION: 1. No acute intracranial abnormalities. 2. Stable generalized atrophy.   Electronically Signed   By: Amie Portland M.D.   On: 11/13/2014 10:56     EKG Interpretation   Date/Time:  Sunday November 13 2014 08:55:55 EDT Ventricular Rate:  67 PR Interval:    QRS Duration: 139 QT Interval:  369 QTC Calculation: 389 R Axis:   -24 Text Interpretation:  Atrial fibrillation Nonspecific intraventricular  conduction delay Consider anterior infarct Borderline repolarization  abnormality Confirmed by Lincoln Brigham [id number]) on 11/13/2014 8:58:31 AM      MDM   Final diagnoses:  None    Patient here for evaluation of generalized weakness and feeling unsteady. Patient is nontoxic appearing on examination with no focal neurologic deficits. He is not having any vomiting in the department and he is able to ambulate without difficulty in the department. He did have a transient low blood pressure that improved without intervention. He does not take any antihypertensives. History and presentation is not consistent with serious arrhythmia, sepsis, PE, CVA. Patient has recently started Clonopin, feel this may be contributing to his symptoms. Discussed discontinuing the Klonopin for the next few nights and then restarting at a half dose. Home care and return precautions were discussed.  I personally performed the services described in this  documentation, which was scribed in my presence. The recorded information has been reviewed and is accurate.   Tilden Fossa, MD 11/13/14 [phone]

## 2014-11-13 NOTE — ED Notes (Signed)
Pt showing aggravated behavior, using foul language to doctor and wife.

## 2014-11-15 LAB — URINE CULTURE: Culture: NO GROWTH

## 2014-11-21 ENCOUNTER — Ambulatory Visit (INDEPENDENT_AMBULATORY_CARE_PROVIDER_SITE_OTHER): Payer: Medicare HMO | Admitting: Physician Assistant

## 2014-11-21 ENCOUNTER — Encounter: Payer: Self-pay | Admitting: Physician Assistant

## 2014-11-21 VITALS — BP 110/72 | HR 94 | Ht 64.5 in | Wt 172.8 lb

## 2014-11-21 DIAGNOSIS — I429 Cardiomyopathy, unspecified: Secondary | ICD-10-CM | POA: Diagnosis not present

## 2014-11-21 DIAGNOSIS — I255 Ischemic cardiomyopathy: Secondary | ICD-10-CM

## 2014-11-21 DIAGNOSIS — I428 Other cardiomyopathies: Secondary | ICD-10-CM

## 2014-11-21 DIAGNOSIS — Z95810 Presence of automatic (implantable) cardiac defibrillator: Secondary | ICD-10-CM

## 2014-11-21 DIAGNOSIS — I482 Chronic atrial fibrillation: Secondary | ICD-10-CM

## 2014-11-21 DIAGNOSIS — I2510 Atherosclerotic heart disease of native coronary artery without angina pectoris: Secondary | ICD-10-CM

## 2014-11-21 DIAGNOSIS — I4821 Permanent atrial fibrillation: Secondary | ICD-10-CM

## 2014-11-21 DIAGNOSIS — E785 Hyperlipidemia, unspecified: Secondary | ICD-10-CM

## 2014-11-21 DIAGNOSIS — I5042 Chronic combined systolic (congestive) and diastolic (congestive) heart failure: Secondary | ICD-10-CM

## 2014-11-21 NOTE — Progress Notes (Signed)
Patient ID: [name], male   DOB: 12/28/1934, 80 y.o.   MRN: [id number]    Date:  11/21/2014   ID:  [name], DOB Mar 11, 1935, MRN [id number]  PCP:  [name], MD  Primary Cardiologist:  [name]  Chief Complaint  Patient presents with  . Hospitalization Follow-up    hospital in July, ED 8/7; patient was given Rx for clonazepam but is not taking; complains of lightheadedness/dizziness when turning his head and when getting up     History of Present Illness: [name] is a 80 y.o. male followed by [name], with h/o CAD s/p anterior MI in 1998 and multiple interventions to his LAD and RCA, ischemic cardiomyopathy w/ EF of 20-25% s/p ICD followed by Dr. Thayer Ohm, chronic atrial fibrillation no longer on anticoagulation, HTN, HDL, DM and OSA on CPAP therapy. He was admitted in July 2016 after having multiple episodes of syncope and collapse/ seizure.  CT scan of his head 10/16/2014 showed 2 minute subdural hematomas. Per notes, Neurosurgery did not feel these findings warranted any intervention. EEG was normal. His EKGs showed chronic LBBB and 2D echo showed no significant changes compared to prior studies. His EF was 20-25% with diffuse hypokinesis. There is akinesis of the anterior septal and apical myocardium. Peak PA pressure 33 mmHg.  ICD interrogation revealed recent episode of 15 seconds of rapid arrhythmia which self terminated without device therapy.  Carotid Dopplers revealed the vertebral arteries were patent with antegrade flow. 1-39 percent stenosis involving the right internal carotid artery and the left internal carotidartery.  Patient is here today for posthospital follow-up. Blood pressures well-controlled. He reports a day and a half ago he was at his daughter's house and he was in the crawl space of her home looking for the water shutoff valve and when he turned his head and looked up he became dizzy as though things spinning.  He also had an episode when he was  working in his shower starting to plan to put some railings in he looked up again started falling backwards. Luckily at that point his wife was standing behind him and held him up.  He otherwise denies nausea, vomiting, fever, chest pain, shortness of breath, orthopnea, PND, cough, congestion, abdominal pain, hematochezia, melena, lower extremity edema, claudication.  Wt Readings from Last 3 Encounters:  11/21/14 172 lb 12.8 oz (78.382 kg)  11/08/14 172 lb (78.019 kg)  10/22/14 167 lb 1.7 oz (75.8 kg)     Past Medical History  Diagnosis Date  . Arteriosclerotic cardiovascular disease (ASCVD)   . COPD (chronic obstructive pulmonary disease)   . Gout   . DJD (degenerative joint disease)   . Hyperlipidemia   . Chronic anticoagulation 2012    2012  . CHF (congestive heart failure)   . Atrial fibrillation     Onset in 2012  . Atrial flutter   . ICD (implantable cardiac defibrillator) in place   . Myocardial infarction 1998  . Ischemic cardiomyopathy   . Obstructive sleep apnea     "went away when I lost a bunch of weight" (08/17/2012)  . Type II diabetes mellitus   . Kidney stone     "just once" (08/17/2012)  . NICM (nonischemic cardiomyopathy), EF 25-30% 09-11-12  . At risk for sudden cardiac death 09/11/2012  . Permanent atrial fibrillation 02/15/2011    Initial onset in 03/2011 with rapid ventricular response   . S/P ICD (internal cardiac defibrillator) procedure, 08/17/12, [employer] 09/11/2012  [employer]  . Bleeding in brain due to brain aneurysm October 06 2014    Current Outpatient Prescriptions  Medication Sig Dispense Refill  . digoxin (LANOXIN) 0.25 MG tablet Take 1 tablet (0.25 mg total) by mouth daily. 30 tablet 3  . levETIRAcetam (KEPPRA) 1000 MG tablet Take 1 tablet (1,000 mg total) by mouth 2 (two) times daily. 60 tablet 11  . magnesium oxide (MAG-OX) 400 MG tablet Take 1 tablet (400 mg total) by mouth 2 (two) times daily. 60 tablet 2  . metFORMIN  (GLUCOPHAGE) 1000 MG tablet Take 1 tablet (1,000 mg total) by mouth 2 (two) times daily with a meal. 60 tablet 3  . pravastatin (PRAVACHOL) 40 MG tablet Take 1 tablet (40 mg total) by mouth daily. 30 tablet 3   No current facility-administered medications for this visit.    Allergies:    Allergies  Allergen Reactions  . Lipitor [Atorvastatin] Other (See Comments)    myalgias   . Percocet [Oxycodone-Acetaminophen] Nausea And Vomiting    Needs to drink milk and take a Zofran  . Procaine Hcl Nausea And Vomiting  . Tramadol Nausea And Vomiting  . Vicodin [Hydrocodone-Acetaminophen] Nausea And Vomiting    Social History:  The patient  reports that he quit smoking about 22 years ago. His smoking use included Cigarettes. He has a 80 pack-year smoking history. He has quit using smokeless tobacco. He reports that he does not drink alcohol or use illicit drugs.   Family history:   Family History  Problem Relation Age of Onset  . Heart failure Mother   . Heart failure Father     ROS:  Please see the history of present illness.  All other systems reviewed and negative.   PHYSICAL EXAM: VS:  BP 110/72 mmHg  Pulse 94  Ht 5' 4.5" (1.638 m)  Wt 172 lb 12.8 oz (78.382 kg)  BMI 29.21 kg/m2 Well nourished, well developed, in no acute distress HEENT: Pupils are equal round react to light accommodation extraocular movements are intact.  Neck: no JVDNo cervical lymphadenopathy. Cardiac: Irregular rate and rhythm without murmurs rubs or gallops  Lungs:  clear to auscultation bilaterally, no wheezing, rhonchi or rales Abd: soft, nontender, positive bowel sounds all quadrants, no hepatosplenomegaly Ext: no lower extremity edema.  2+ radial and dorsalis pedis pulses. Skin: warm and dry Neuro:  Grossly normal    ASSESSMENT AND PLAN:   Problem List Items Addressed This Visit    S/P ICD (internal cardiac defibrillator) procedure, 08/17/12, [employer] implanted   Permanent atrial  fibrillation - Primary (Chronic)    Rate well controlled. He's no longer on anticoagulation  as a recent CT scan of his head showed 2 minute subdural hematomas.      RESOLVED: NICM (nonischemic cardiomyopathy), EF 25-30% (Chronic)    Patient appears euvolemic.      HLD (hyperlipidemia)    Continue statin Lipid Panel     Component Value Date/Time   CHOL 141 10/16/2014 1200   TRIG 532* 10/16/2014 1200   HDL 25* 10/16/2014 1200   CHOLHDL 5.6 10/16/2014 1200   VLDL UNABLE TO CALCULATE IF TRIGLYCERIDE OVER 400 mg/dL 27/10/8673 4492   LDLCALC UNABLE TO CALCULATE IF TRIGLYCERIDE OVER 400 mg/dL 01/00/7121 9758   Recheck lipid panel next office visit.       Chronic combined systolic and diastolic CHF, NYHA class 2    Patient appears euvolemic.      Cardiomyopathy, ischemic   Arteriosclerotic cardiovascular disease (ASCVD)  No complaints of angina.

## 2014-11-21 NOTE — Assessment & Plan Note (Signed)
Patient appears euvolemic. 

## 2014-11-21 NOTE — Assessment & Plan Note (Signed)
No complaints of angina. 

## 2014-11-21 NOTE — Assessment & Plan Note (Signed)
Continue statin Lipid Panel     Component Value Date/Time   CHOL 141 10/16/2014 1200   TRIG 532* 10/16/2014 1200   HDL 25* 10/16/2014 1200   CHOLHDL 5.6 10/16/2014 1200   VLDL UNABLE TO CALCULATE IF TRIGLYCERIDE OVER 400 mg/dL 40/98/1191 4782   LDLCALC UNABLE TO CALCULATE IF TRIGLYCERIDE OVER 400 mg/dL 95/62/1308 6578   Recheck lipid panel next office visit.

## 2014-11-21 NOTE — Patient Instructions (Signed)
Your physician recommends that you schedule a follow-up appointment in: 3 months with Dr. Kelly. 

## 2014-11-21 NOTE — Assessment & Plan Note (Signed)
Rate well controlled. He's no longer on anticoagulation  as a recent CT scan of his head showed 2 minute subdural hematomas.

## 2014-12-01 NOTE — Progress Notes (Addendum)
Physical Therapy  Late Note for G-Codes    October 27, 2014 08-05-2111  PT G-Codes **NOT FOR INPATIENT CLASS**  Functional Assessment Tool Used Clinical judgement  Functional Limitation Mobility: Walking and moving around  Mobility: Walking and Moving Around Current Status [phone]) CH  Mobility: Walking and Moving Around Goal Status [phone]) CH  Mobility: Walking and Moving Around Discharge Status [phone]) CH  Durenda Hurt. Renaldo Fiddler, [facility] Pager [phone]

## 2014-12-02 ENCOUNTER — Encounter: Payer: Self-pay | Admitting: *Deleted

## 2014-12-08 ENCOUNTER — Telehealth: Payer: Self-pay | Admitting: Cardiology

## 2014-12-08 NOTE — Telephone Encounter (Signed)
I understand.

## 2014-12-08 NOTE — Telephone Encounter (Signed)
Pt called in regards to letter received. He stated that he was in hospital in June or July and that he seen MD then. He stated that he does not want to pay the $45.00 co pay to come into the office to get his device checked b/c he has never used it and he doesn't think he'll ever use it b/c "the [employer] guy told him that he won't get shocked until his heart rate is at 200" he stated that his rate would never get to 200 and if it did he would be dead. I explained to the pt the importance of coming into the office once a year to get his device checked. I was able to get pt to agree to a remote transmission on 01-09-15 and then come into the office 91 days after that to see the MD. Pt is aware that I could not make the appt for MD today b/c we do not have a schedule that far out. Pt aware that he will receive a letter letting him know he needs an appt w/ MD.

## 2015-01-04 ENCOUNTER — Encounter: Payer: Self-pay | Admitting: *Deleted

## 2015-01-09 ENCOUNTER — Encounter: Payer: Self-pay | Admitting: Cardiovascular Disease

## 2015-01-09 ENCOUNTER — Telehealth: Payer: Self-pay | Admitting: Cardiology

## 2015-01-09 ENCOUNTER — Ambulatory Visit (INDEPENDENT_AMBULATORY_CARE_PROVIDER_SITE_OTHER): Payer: Medicare HMO | Admitting: *Deleted

## 2015-01-09 DIAGNOSIS — I429 Cardiomyopathy, unspecified: Secondary | ICD-10-CM | POA: Diagnosis not present

## 2015-01-09 DIAGNOSIS — I428 Other cardiomyopathies: Secondary | ICD-10-CM

## 2015-01-09 NOTE — Telephone Encounter (Signed)
LMOVM reminding pt to send remote transmission.   

## 2015-01-10 NOTE — Progress Notes (Signed)
Remote ICD transmission.   

## 2015-02-03 LAB — CUP PACEART REMOTE DEVICE CHECK
Battery Remaining Longevity: 132 mo
Implantable Lead Implant Date: 20140512
Implantable Lead Serial Number: 126556
Lead Channel Impedance Value: 474 Ohm
Lead Channel Sensing Intrinsic Amplitude: 16.3 mV
Lead Channel Setting Pacing Amplitude: 2.5 V
Lead Channel Setting Pacing Pulse Width: 0.4 ms
MDC IDC LEAD LOCATION: 753860
MDC IDC LEAD MODEL: 292
MDC IDC PG SERIAL: 106496
MDC IDC SESS DTM: 20161028191251
MDC IDC SET LEADCHNL RV SENSING SENSITIVITY: 0.4 mV
MDC IDC STAT BRADY RV PERCENT PACED: 11 %

## 2015-02-08 ENCOUNTER — Ambulatory Visit: Payer: Medicare HMO | Admitting: Neurology

## 2015-02-08 ENCOUNTER — Telehealth: Payer: Self-pay | Admitting: *Deleted

## 2015-02-08 NOTE — Telephone Encounter (Signed)
No showed follow up appt. 

## 2015-02-09 ENCOUNTER — Encounter: Payer: Self-pay | Admitting: Neurology

## 2015-02-15 ENCOUNTER — Encounter: Payer: Self-pay | Admitting: Cardiovascular Disease

## 2015-02-15 ENCOUNTER — Ambulatory Visit (INDEPENDENT_AMBULATORY_CARE_PROVIDER_SITE_OTHER): Payer: Medicare HMO | Admitting: Cardiovascular Disease

## 2015-02-15 VITALS — BP 116/70 | HR 83 | Resp 22 | Ht 64.5 in | Wt 174.5 lb

## 2015-02-15 DIAGNOSIS — I482 Chronic atrial fibrillation: Secondary | ICD-10-CM

## 2015-02-15 DIAGNOSIS — I5042 Chronic combined systolic (congestive) and diastolic (congestive) heart failure: Secondary | ICD-10-CM | POA: Diagnosis not present

## 2015-02-15 DIAGNOSIS — I5022 Chronic systolic (congestive) heart failure: Secondary | ICD-10-CM | POA: Diagnosis not present

## 2015-02-15 DIAGNOSIS — I5043 Acute on chronic combined systolic (congestive) and diastolic (congestive) heart failure: Secondary | ICD-10-CM

## 2015-02-15 DIAGNOSIS — J439 Emphysema, unspecified: Secondary | ICD-10-CM

## 2015-02-15 DIAGNOSIS — I4821 Permanent atrial fibrillation: Secondary | ICD-10-CM

## 2015-02-15 DIAGNOSIS — Z95810 Presence of automatic (implantable) cardiac defibrillator: Secondary | ICD-10-CM

## 2015-02-15 MED ORDER — POTASSIUM CHLORIDE ER 10 MEQ PO TBCR: 10.0000 meq | EXTENDED_RELEASE_TABLET | Freq: Every day | ORAL | Status: DC

## 2015-02-15 MED ORDER — FUROSEMIDE 40 MG PO TABS: ORAL_TABLET | ORAL | Status: DC

## 2015-02-15 NOTE — Patient Instructions (Signed)
Medication Instructions:   INCREASE FUROSEMIDE TO 60 MG ONCE DAILY= 1 AND 1/2 TABLET ONCE DAILY  START POTASSIUM 10 MEQ ONE TABLET ONCE DAILY   Labwork:  Your physician recommends that you return for lab work in: ONE WEEK  Follow-Up:  Your physician recommends that you schedule a follow-up appointment in: AS SCHEDULED   If you need a refill on your cardiac medications before your next appointment, please call your pharmacy.

## 2015-02-17 ENCOUNTER — Encounter: Payer: Self-pay | Admitting: Cardiovascular Disease

## 2015-02-17 DIAGNOSIS — I5043 Acute on chronic combined systolic (congestive) and diastolic (congestive) heart failure: Secondary | ICD-10-CM | POA: Insufficient documentation

## 2015-02-17 NOTE — Progress Notes (Signed)
Patient ID: [name], male   DOB: 08-28-1934, 80 y.o.   MRN: [id number]     Cardiology Office Note   Date:  02/17/2015   ID:  [name], DOB 11-27-1934, MRN [id number]  PCP:  [name], MD  Cardiologist:   Nicki Guadalajara, M.D.;  Thurmon Fair, MD   Chief Complaint  Patient presents with  . Follow-up    patient reports chest pain and shortness of breath with minimal exertion      History of Present Illness: [name] is a 80 y.o. male who presents for  Defibrillator check, but describes worsening exertional dyspnea as well as orthopnea and some lower extremity edema. He has gained only 2 pounds since his last appointment in August, but weighs about 10 pounds more than he did in June.  He denies angina pectoris or worsening wheezing and has only mild ankle swelling.   he was hospitalized in July with subdural hematoma. Warfarin was discontinued and the recommendation was for him to start Eliquis  About a week after his discharge. He is not taking any anticoagulants currently. He does not want to start any.   Interrogation of his [employer] incepta  Single chamber defibrillator shows normal device function. Estimated generator longevity is 10.5 years. There have been no sustained ventricular arrhythmias or delivered therapies. He had a single 12 beat run of nonsustained ventricular tachycardia in August which did not require therapy. Lead parameters are excellent.  [name] had an anterior myocardial infarction in 1998 and subsequently underwent percutaneous interventions to the LAD artery and right coronary artery. He has severe ischemic cardiomyopathy with an ejection fraction of around 20%  And long-standing combined systolic and diastolic heart failure, usually NYHA functional class II-III. He has permanent atrial fibrillation but is not taking anticoagulation therapy due to history of previous intracranial hemorrhage. He has an 80-pack-year history of smoking and  COPD.  Past Medical History  Diagnosis Date  . Arteriosclerotic cardiovascular disease (ASCVD)   . COPD (chronic obstructive pulmonary disease) (HCC)   . Gout   . DJD (degenerative joint disease)   . Hyperlipidemia   . Chronic anticoagulation 2012    2012  . CHF (congestive heart failure) (HCC)   . Atrial fibrillation (HCC)     Onset in 2012  . Atrial flutter (HCC)   . ICD (implantable cardiac defibrillator) in place   . Myocardial infarction (HCC) 1998  . Ischemic cardiomyopathy   . Obstructive sleep apnea     "went away when I lost a bunch of weight" (08/17/2012)  . Type II diabetes mellitus (HCC)   . Kidney stone     "just once" (08/17/2012)  . NICM (nonischemic cardiomyopathy), EF 25-30% 09/12/2012  . At risk for sudden cardiac death 2012/09/12  . Permanent atrial fibrillation (HCC) 02/15/2011    Initial onset in 03/2011 with rapid ventricular response   . S/P ICD (internal cardiac defibrillator) procedure, 08/17/12, [employer] September 12, 2012    [employer]  . Bleeding in brain due to brain aneurysm [facility]) October 06 2014    Past Surgical History  Procedure Laterality Date  . Cholecystectomy  2009  . Knee arthroplasty Left 1978    "tendon & cartilege repair" (08/17/2012)  . Vasectomy  ~ 1964  . Cystoscopy/retrograde/ureteroscopy  06/28/2011    Procedure: CYSTOSCOPY/RETROGRADE/URETEROSCOPY;  Surgeon: Ky Barban, MD;  Location: [facility];  Service: Urology;  Laterality: Right;  . Stone extraction with basket  06/28/2011    Procedure: STONE EXTRACTION WITH  BASKET;  Surgeon: Ky Barban, MD;  Location: [facility];  Service: Urology;  Laterality: Right;  specimen given to family per MD  . Cardiac defibrillator placement  08/17/2012    [employer]  . Coronary angioplasty with stent placement  07/14/1996    "1" (08/17/2012)  . Cataract extraction w/ intraocular lens  implant, bilateral Bilateral ~ 2011  . US echocardiography  07/08/2012    EF <20%,mild MR,TR,LA severely dilated   . Myoview perfusion scan  06/02/2012    low risk, extensive scar entire LAD & RCA territory  . S/p icd  08/2012    [employer]  . Implantable cardioverter defibrillator implant N/A 08/17/2012    Procedure: IMPLANTABLE CARDIOVERTER DEFIBRILLATOR IMPLANT;  Surgeon: Thurmon Fair, MD;  Location: [facility];  Service: Cardiovascular;  Laterality: N/A;  . Cardiac catheterization  02/2003     Current Outpatient Prescriptions  Medication Sig Dispense Refill  . digoxin (LANOXIN) 0.25 MG tablet Take 1 tablet (0.25 mg total) by mouth daily. 30 tablet 3  . furosemide (LASIX) 40 MG tablet TAKE 1 AND 1/2 TABLETS ONCE DAILY 45 tablet 6  . levETIRAcetam (KEPPRA) 1000 MG tablet Take 1 tablet (1,000 mg total) by mouth 2 (two) times daily. 60 tablet 11  . magnesium oxide (MAG-OX) 400 MG tablet Take 1 tablet (400 mg total) by mouth 2 (two) times daily. 60 tablet 2  . metFORMIN (GLUCOPHAGE) 1000 MG tablet Take 1 tablet (1,000 mg total) by mouth 2 (two) times daily with a meal. 60 tablet 3  . pravastatin (PRAVACHOL) 40 MG tablet Take 1 tablet (40 mg total) by mouth daily. 30 tablet 3  . potassium chloride (K-DUR) 10 MEQ tablet Take 1 tablet (10 mEq total) by mouth daily. 30 tablet 6   No current facility-administered medications for this visit.    Allergies:   Lipitor; Percocet; Procaine hcl; Tramadol; and Vicodin    Social History:  The patient  reports that he quit smoking about 22 years ago. His smoking use included Cigarettes. He has a 80 pack-year smoking history. He has quit using smokeless tobacco. He reports that he does not drink alcohol or use illicit drugs.   Family History:  The patient's family history includes Heart failure in his father and mother.    ROS:  Please see the history of present illness.    Otherwise, review of systems positive for  Multiple sore joints.   All other systems are reviewed and negative.    PHYSICAL EXAM: VS:  BP 116/70 mmHg  Pulse 83  Ht 5' 4.5"  (1.638 m)  Wt 174 lb 8 oz (79.153 kg)  BMI 29.50 kg/m2 , BMI Body mass index is 29.5 kg/(m^2).  General: Alert, oriented x3, no distress. Unkempt. Head: no evidence of trauma, PERRL, EOMI, no exophtalmos or lid lag, no myxedema, no xanthelasma; normal ears, nose and oropharynx Neck: normal jugular venous pulsations and no hepatojugular reflux; brisk carotid pulses without delay and no carotid bruits Chest: clear to auscultation, no signs of consolidation by percussion or palpation, normal fremitus, symmetrical and full respiratory excursions,  Healthy defibrillator site Cardiovascular: normal position and quality of the apical impulse, irregular rhythm, normal first and second heart sounds, no  murmurs, rubs or gallops Abdomen: no tenderness or distention, no masses by palpation, no abnormal pulsatility or arterial bruits, normal bowel sounds, no hepatosplenomegaly Extremities: no clubbing, cyanosis or edema; 2+ radial, ulnar and brachial pulses bilaterally; 2+ right femoral, posterior tibial and dorsalis pedis pulses; 2+ left  femoral, posterior tibial and dorsalis pedis pulses; no subclavian or femoral bruits Neurological: grossly nonfocal Psych: euthymic mood, full affect   EKG:  EKG is ordered today. The ekg ordered today demonstrates  Atrial fibrillation, nonspecific intraventricular conduction delay with left axis deviation and QRS of 140 ms , QTC 451 ms   Recent Labs: 04/05/2014: TSH 1.749 10/20/2014: Magnesium 1.7 11/13/2014: ALT 23; B Natriuretic Peptide 162.0*; BUN 11; Creatinine, Ser 0.98; Hemoglobin 16.3; Platelets 141*; Potassium 4.2; Sodium 140    Lipid Panel    Component Value Date/Time   CHOL 141 10/16/2014 1200   TRIG 532* 10/16/2014 1200   HDL 25* 10/16/2014 1200   CHOLHDL 5.6 10/16/2014 1200   VLDL UNABLE TO CALCULATE IF TRIGLYCERIDE OVER 400 mg/dL 16/01/9603 5409   LDLCALC UNABLE TO CALCULATE IF TRIGLYCERIDE OVER 400 mg/dL 81/19/1478 2956      Wt Readings from  Last 3 Encounters:  02/15/15 174 lb 8 oz (79.153 kg)  11/21/14 172 lb 12.8 oz (78.382 kg)  11/08/14 172 lb (78.019 kg)   .   ASSESSMENT AND PLAN:  1.   Acute on chronic combined systolic and diastolic heart failure, mild signs of hypervolemia. Furosemide dose was increased and he should have repeat laboratory tests next week  2.  Severe ischemic cardiomyopathy  3.  Permanent atrial fibrillation not on anticoagulation due to intracranial hemorrhage in July.  At this point he does not want to start any anticoagulant.  Rate control previously involved a beta blocker but this was stopped  During his July admission due to hypotension.  4.  Normally functioning single chamber defibrillator with rare episodes of nonsustained VT  5.  COPD  6.  Systemic hypertension well controlled  Ideally, carvedilol should be restarted. Thankfully, rapid ventricular rates are not currently an issue. I would not want to restart a beta blocker when he is in acute heart failure. Reevaluate after he is diuresed.  Current medicines are reviewed at length with the patient today.  The patient does not have concerns regarding medicines.    Labs/ tests ordered today include:  Orders Placed This Encounter  Procedures  . Basic Metabolic Panel (BMET)  . Magnesium  . EKG 12-Lead   Patient Instructions  Medication Instructions:   INCREASE FUROSEMIDE TO 60 MG ONCE DAILY= 1 AND 1/2 TABLET ONCE DAILY  START POTASSIUM 10 MEQ ONE TABLET ONCE DAILY   Labwork:  Your physician recommends that you return for lab work in: ONE WEEK  Follow-Up:  Your physician recommends that you schedule a follow-up appointment in: AS SCHEDULED   If you need a refill on your cardiac medications before your next appointment, please call your pharmacy.        [name] Bimler, MD  02/17/2015 6:20 PM    Thurmon Fair, MD, [facility] [phone] office [phone] pager

## 2015-02-20 ENCOUNTER — Ambulatory Visit: Payer: Medicare HMO | Admitting: Cardiovascular Disease

## 2015-02-28 ENCOUNTER — Ambulatory Visit: Payer: Medicare HMO | Admitting: Cardiovascular Disease

## 2015-02-28 DIAGNOSIS — R0989 Other specified symptoms and signs involving the circulatory and respiratory systems: Secondary | ICD-10-CM

## 2015-03-01 ENCOUNTER — Telehealth: Payer: Self-pay | Admitting: Cardiovascular Disease

## 2015-03-01 NOTE — Telephone Encounter (Signed)
Called pt. to ask why he was not able to come to appt on 02/28/15. Left message on vm.

## 2015-03-07 LAB — CUP PACEART INCLINIC DEVICE CHECK
Battery Remaining Longevity: 126 mo
Brady Statistic RV Percent Paced: 10 %
Date Time Interrogation Session: 20161129123250
HIGH POWER IMPEDANCE MEASURED VALUE: 79 Ohm
Implantable Lead Implant Date: 20140512
Implantable Lead Location: 753860
Implantable Lead Serial Number: 126556
Lead Channel Impedance Value: 411 Ohm
Lead Channel Pacing Threshold Amplitude: 1.1 V
Lead Channel Sensing Intrinsic Amplitude: 10.7 mV
MDC IDC LEAD MODEL: 292
MDC IDC MSMT LEADCHNL RV PACING THRESHOLD PULSEWIDTH: 0.4 ms
Pulse Gen Serial Number: 106496

## 2015-03-09 ENCOUNTER — Ambulatory Visit: Payer: Medicare HMO | Admitting: Cardiovascular Disease

## 2015-06-07 ENCOUNTER — Encounter: Payer: Self-pay | Admitting: *Deleted

## 2015-06-12 ENCOUNTER — Ambulatory Visit (INDEPENDENT_AMBULATORY_CARE_PROVIDER_SITE_OTHER): Payer: Medicare HMO | Admitting: *Deleted

## 2015-06-12 DIAGNOSIS — I255 Ischemic cardiomyopathy: Secondary | ICD-10-CM | POA: Diagnosis not present

## 2015-06-16 NOTE — Progress Notes (Signed)
Remote ICD transmission.   

## 2015-06-17 LAB — CUP PACEART REMOTE DEVICE CHECK
Battery Remaining Longevity: 126 mo
Battery Remaining Percentage: 100 %
Brady Statistic RV Percent Paced: 2 %
HighPow Impedance: 88 Ohm
Implantable Lead Implant Date: 20140512
Implantable Lead Location: 753860
Implantable Lead Model: 292
Lead Channel Impedance Value: 447 Ohm
Lead Channel Pacing Threshold Pulse Width: 0.4 ms
Lead Channel Setting Pacing Amplitude: 2.4 V
Lead Channel Setting Pacing Pulse Width: 0.4 ms
Lead Channel Setting Sensing Sensitivity: 0.4 mV
MDC IDC LEAD SERIAL: 126556
MDC IDC MSMT LEADCHNL RV PACING THRESHOLD AMPLITUDE: 1.1 V
MDC IDC SESS DTM: 20170304050800
Pulse Gen Serial Number: 106496

## 2015-06-17 NOTE — Progress Notes (Signed)
Normal remote reviewed.  Next follow up 09/2015 [facility]

## 2015-06-21 ENCOUNTER — Encounter: Payer: Self-pay | Admitting: Cardiology

## 2015-07-06 ENCOUNTER — Telehealth: Payer: Self-pay | Admitting: Cardiovascular Disease

## 2015-07-06 NOTE — Telephone Encounter (Signed)
LMOM requesting call back.  Gave device clinic phone number. 

## 2015-07-06 NOTE — Telephone Encounter (Signed)
New Message:   Pt says he received a letter,calling to find out what is it about.

## 2015-07-12 ENCOUNTER — Emergency Department (HOSPITAL_COMMUNITY): Payer: Medicare HMO

## 2015-07-12 ENCOUNTER — Emergency Department (HOSPITAL_COMMUNITY)
Admission: EM | Admit: 2015-07-12 | Discharge: 2015-07-12 | Disposition: A | Discharge status: Home / Self Care | Payer: Medicare HMO | Attending: Emergency Medicine | Admitting: Emergency Medicine

## 2015-07-12 ENCOUNTER — Encounter (HOSPITAL_COMMUNITY): Payer: Self-pay | Admitting: Emergency Medicine

## 2015-07-12 DIAGNOSIS — J449 Chronic obstructive pulmonary disease, unspecified: Secondary | ICD-10-CM | POA: Insufficient documentation

## 2015-07-12 DIAGNOSIS — R531 Weakness: Secondary | ICD-10-CM

## 2015-07-12 DIAGNOSIS — I509 Heart failure, unspecified: Secondary | ICD-10-CM | POA: Diagnosis not present

## 2015-07-12 DIAGNOSIS — I482 Chronic atrial fibrillation: Secondary | ICD-10-CM | POA: Insufficient documentation

## 2015-07-12 DIAGNOSIS — E785 Hyperlipidemia, unspecified: Secondary | ICD-10-CM | POA: Diagnosis not present

## 2015-07-12 DIAGNOSIS — I252 Old myocardial infarction: Secondary | ICD-10-CM | POA: Insufficient documentation

## 2015-07-12 DIAGNOSIS — G8929 Other chronic pain: Secondary | ICD-10-CM | POA: Insufficient documentation

## 2015-07-12 DIAGNOSIS — E119 Type 2 diabetes mellitus without complications: Secondary | ICD-10-CM | POA: Diagnosis not present

## 2015-07-12 DIAGNOSIS — R5383 Other fatigue: Secondary | ICD-10-CM | POA: Diagnosis present

## 2015-07-12 DIAGNOSIS — I2510 Atherosclerotic heart disease of native coronary artery without angina pectoris: Secondary | ICD-10-CM | POA: Insufficient documentation

## 2015-07-12 DIAGNOSIS — Z87891 Personal history of nicotine dependence: Secondary | ICD-10-CM | POA: Diagnosis not present

## 2015-07-12 LAB — COMPREHENSIVE METABOLIC PANEL
ALK PHOS: 55 U/L (ref 38–126)
ALT: 13 U/L — AB (ref 17–63)
AST: 22 U/L (ref 15–41)
Albumin: 4.3 g/dL (ref 3.5–5.0)
Anion gap: 11 (ref 5–15)
BILIRUBIN TOTAL: 1.2 mg/dL (ref 0.3–1.2)
BUN: 17 mg/dL (ref 6–20)
CALCIUM: 9.5 mg/dL (ref 8.9–10.3)
CO2: 29 mmol/L (ref 22–32)
CREATININE: 1.27 mg/dL — AB (ref 0.61–1.24)
Chloride: 97 mmol/L — ABNORMAL LOW (ref 101–111)
GFR calc non Af Amer: 52 mL/min — ABNORMAL LOW (ref >60)
GLUCOSE: 178 mg/dL — AB (ref 65–99)
Potassium: 4.3 mmol/L (ref 3.5–5.1)
Sodium: 137 mmol/L (ref 135–145)
TOTAL PROTEIN: 7.2 g/dL (ref 6.5–8.1)

## 2015-07-12 LAB — URINALYSIS, ROUTINE W REFLEX MICROSCOPIC
Bilirubin Urine: NEGATIVE
GLUCOSE, UA: NEGATIVE mg/dL
Ketones, ur: NEGATIVE mg/dL
LEUKOCYTES UA: NEGATIVE
Nitrite: NEGATIVE
PROTEIN: 30 mg/dL — AB
Specific Gravity, Urine: 1.01 (ref 1.005–1.030)
pH: 6 (ref 5.0–8.0)

## 2015-07-12 LAB — CBC WITH DIFFERENTIAL/PLATELET
BASOS ABS: 0 10*3/uL (ref 0.0–0.1)
BASOS PCT: 0 %
EOS ABS: 0.2 10*3/uL (ref 0.0–0.7)
Eosinophils Relative: 2 %
HEMATOCRIT: 45.1 % (ref 39.0–52.0)
Hemoglobin: 15.8 g/dL (ref 13.0–17.0)
Lymphocytes Relative: 24 %
Lymphs Abs: 2.5 10*3/uL (ref 0.7–4.0)
MCH: 34.4 pg — ABNORMAL HIGH (ref 26.0–34.0)
MCHC: 35 g/dL (ref 30.0–36.0)
MCV: 98.3 fL (ref 78.0–100.0)
MONO ABS: 0.8 10*3/uL (ref 0.1–1.0)
Monocytes Relative: 8 %
NEUTROS ABS: 6.8 10*3/uL (ref 1.7–7.7)
Neutrophils Relative %: 66 %
Platelets: 176 10*3/uL (ref 150–400)
RBC: 4.59 MIL/uL (ref 4.22–5.81)
RDW: 13 % (ref 11.5–15.5)
WBC: 10.4 10*3/uL (ref 4.0–10.5)

## 2015-07-12 LAB — TROPONIN I
Troponin I: 0.05 ng/mL — ABNORMAL HIGH (ref <0.031)
Troponin I: 0.05 ng/mL — ABNORMAL HIGH (ref <0.031)

## 2015-07-12 LAB — URINE MICROSCOPIC-ADD ON
Bacteria, UA: NONE SEEN
WBC UA: NONE SEEN WBC/hpf (ref 0–5)

## 2015-07-12 LAB — CBG MONITORING, ED: Glucose-Capillary: 163 mg/dL — ABNORMAL HIGH (ref 65–99)

## 2015-07-12 LAB — MAGNESIUM: MAGNESIUM: 1.6 mg/dL — AB (ref 1.7–2.4)

## 2015-07-12 LAB — DIGOXIN LEVEL: Digoxin Level: 1.5 ng/mL (ref 0.8–2.0)

## 2015-07-12 NOTE — Telephone Encounter (Signed)
Second attempt:  LMOM requesting call back.  Gave device clinic phone number. 

## 2015-07-12 NOTE — ED Provider Notes (Signed)
CSN: [id number]     Arrival date & time 07/12/15  0126 History   First MD Initiated Contact with Patient 07/12/15 [phone]     Chief Complaint  Patient presents with  . Fatigue     (Consider location/radiation/quality/duration/timing/severity/associated sxs/prior Treatment) HPI patient reports he was up to 3 AM yesterday morning and finally went to sleep however he woke up again at 4 AM because his feet were hurting. He has chronic pain in his legs that he describes as tingling, burning, itching. He states he just didn't feel well today he has a difficult time explaining how he feels bad. He states he just wanted to lay down all day today. He denies headache, sore throat, rhinorrhea, cough, chest pain, shortness of breath, nausea, vomiting, abdominal pain, diarrhea, he states he's been urinating normally.  She has chronic atrial fibrillation and has a defibrillator. He states he was on a blood thinner until a year ago when he fell and hit his head and he had a bleeding in the brain. At that point the blood thinners were stopped.   PCP Dr Janna Arch  Past Medical History  Diagnosis Date  . Arteriosclerotic cardiovascular disease (ASCVD)   . COPD (chronic obstructive pulmonary disease) (HCC)   . Gout   . DJD (degenerative joint disease)   . Hyperlipidemia   . Chronic anticoagulation 2012    2012  . CHF (congestive heart failure) (HCC)   . Atrial fibrillation (HCC)     Onset in 2012  . Atrial flutter (HCC)   . ICD (implantable cardiac defibrillator) in place   . Myocardial infarction (HCC) 1998  . Ischemic cardiomyopathy   . Obstructive sleep apnea     "went away when I lost a bunch of weight" (08/17/2012)  . Type II diabetes mellitus (HCC)   . Kidney stone     "just once" (08/17/2012)  . NICM (nonischemic cardiomyopathy), EF 25-30% 09-12-2012  . At risk for sudden cardiac death 09/12/2012  . Permanent atrial fibrillation (HCC) 02/15/2011    Initial onset in 03/2011 with rapid ventricular  response   . S/P ICD (internal cardiac defibrillator) procedure, 08/17/12, [employer] 12-Sep-2012    [employer]  . Bleeding in brain due to brain aneurysm [facility]) October 06 2014   Past Surgical History  Procedure Laterality Date  . Cholecystectomy  2009  . Knee arthroplasty Left 1978    "tendon & cartilege repair" (08/17/2012)  . Vasectomy  ~ 1964  . Cystoscopy/retrograde/ureteroscopy  06/28/2011    Procedure: CYSTOSCOPY/RETROGRADE/URETEROSCOPY;  Surgeon: Ky Barban, MD;  Location: [facility];  Service: Urology;  Laterality: Right;  . Stone extraction with basket  06/28/2011    Procedure: STONE EXTRACTION WITH BASKET;  Surgeon: Ky Barban, MD;  Location: [facility];  Service: Urology;  Laterality: Right;  specimen given to family per MD  . Cardiac defibrillator placement  08/17/2012    [employer]  . Coronary angioplasty with stent placement  07/14/1996    "1" (08/17/2012)  . Cataract extraction w/ intraocular lens  implant, bilateral Bilateral ~ 2011  . US echocardiography  07/08/2012    EF <20%,mild MR,TR,LA severely dilated  . Myoview perfusion scan  06/02/2012    low risk, extensive scar entire LAD & RCA territory  . S/p icd  08/2012    [employer]  . Implantable cardioverter defibrillator implant N/A 08/17/2012    Procedure: IMPLANTABLE CARDIOVERTER DEFIBRILLATOR IMPLANT;  Surgeon: Thurmon Fair, MD;  Location: [facility];  Service: Cardiovascular;  Laterality:  N/A;  . Cardiac catheterization  02/2003   Family History  Problem Relation Age of Onset  . Heart failure Mother   . Heart failure Father    Social History  Substance Use Topics  . Smoking status: Former Smoker -- 2.00 packs/day for 40 years    Types: Cigarettes    Quit date: 04/08/1992  . Smokeless tobacco: Former Neurosurgeon  . Alcohol Use: No     Comment: 08/17/2012 "quit drinking in 1983"  lives at home Lives with spouse  Review of Systems  All other systems reviewed and are negative.     Allergies    Lipitor; Percocet; Procaine hcl; Tramadol; and Vicodin  Home Medications   Prior to Admission medications   Medication Sig Start Date End Date Taking? Authorizing Provider  digoxin (LANOXIN) 0.25 MG tablet Take 1 tablet (0.25 mg total) by mouth daily. 10/23/14 09/20/17  Ripudeep Jenna Luo, MD  furosemide (LASIX) 40 MG tablet TAKE 1 AND 1/2 TABLETS ONCE DAILY 02/15/15   Mihai Croitoru, MD  levETIRAcetam (KEPPRA) 1000 MG tablet Take 1 tablet (1,000 mg total) by mouth 2 (two) times daily. 11/08/14   Levert Feinstein, MD  magnesium oxide (MAG-OX) 400 MG tablet Take 1 tablet (400 mg total) by mouth 2 (two) times daily. 10/23/14   Ripudeep Jenna Luo, MD  metFORMIN (GLUCOPHAGE) 1000 MG tablet Take 1 tablet (1,000 mg total) by mouth 2 (two) times daily with a meal. 10/23/14   Ripudeep K Rai, MD  potassium chloride (K-DUR) 10 MEQ tablet Take 1 tablet (10 mEq total) by mouth daily. 02/15/15   Mihai Croitoru, MD  pravastatin (PRAVACHOL) 40 MG tablet Take 1 tablet (40 mg total) by mouth daily. 10/23/14   Ripudeep Jenna Luo, MD   BP 114/74 mmHg  Pulse 96  Temp(Src) 97.5 F (36.4 C) (Oral)  Resp 12  SpO2 96%  Vital signs normal    Orthostatic VS - Lying from 07/11/15 0545 to 07/12/15 0545    Date/Time   07/12/15 0441   BP- Lying: 100/78 mmHg   Pulse- Lying: 76   Who: [facility]   07/12/15 0253   BP- Lying: 122/85 mmHg   Pulse- Lying: 110   Who: SAL     Orthostatic VS - Sitting from 07/11/15 0545 to 07/12/15 0545    Date/Time   07/12/15 0441   BP- Sitting: 109/75 mmHg   Pulse- Sitting: 82   Who: [facility]   07/12/15 0253   BP- Sitting: 118/82 mmHg   Pulse- Sitting: 115   Who: SAL     Orthostatic VS - Standing from 07/11/15 0545 to 07/12/15 0545    Date/Time   07/12/15 0441   BP- Standing at 0 minutes: 120/72 mmHg   Pulse- Standing at 0 minutes: 89   Who: [facility]   07/12/15 0253   BP- Standing at 0 minutes:  113/91 mmHg   Pulse- Standing at 0 minutes: 108   Who: SAL       No significant  change, stable tachycardia  Physical Exam  Constitutional: He is oriented to person, place, and time. He appears well-developed and well-nourished.  Non-toxic appearance. He does not appear ill. No distress.  HENT:  Head: Normocephalic and atraumatic.  Right Ear: External ear normal.  Left Ear: External ear normal.  Nose: Nose normal. No mucosal edema or rhinorrhea.  Mouth/Throat: Oropharynx is clear and moist and mucous membranes are normal. No dental abscesses or uvula swelling.  Eyes: Conjunctivae and EOM are normal. Pupils are equal, round,  and reactive to light.  Neck: Normal range of motion and full passive range of motion without pain. Neck supple.  Cardiovascular: Normal rate, regular rhythm and normal heart sounds.  Exam reveals no gallop and no friction rub.   No murmur heard. Pulmonary/Chest: Effort normal and breath sounds normal. No respiratory distress. He has no wheezes. He has no rhonchi. He has no rales. He exhibits no tenderness and no crepitus.  Abdominal: Soft. Normal appearance and bowel sounds are normal. He exhibits no distension. There is no tenderness. There is no rebound and no guarding.  Musculoskeletal: Normal range of motion. He exhibits no edema or tenderness.  Moves all extremities well.   Neurological: He is alert and oriented to person, place, and time. He has normal strength. No cranial nerve deficit.  Skin: Skin is warm, dry and intact. No rash noted. No erythema. No pallor.  Psychiatric: He has a normal mood and affect. His speech is normal and behavior is normal. His mood appears not anxious.  Nursing note and vitals reviewed.   ED Course  Procedures (including critical care time)  Patient had lab work done, orthostatic vital signs, and a CT scan done to look for an occult subdural or other intracranial problem that could make him feel weak.  Please note that patient refers to his PCP as a hemorrhoid. He explained that a hemorrhoid is a northerner   who comes [place]  and doesn't leave. I have advised him that I did not want him to call me a hemorrhoid. He states his cardiologist is also a hemorrhoid.   04:30 AM patient was given his test results so far. I explained the reason to get a delta troponin.  Pt is ready to be discharged after his delta troponin which is unchanged and felt to be chronic from his atrial fib.     Labs Review Results for orders placed or performed during the hospital encounter of 07/12/15  Comprehensive metabolic panel  Result Value Ref Range   Sodium 137 135 - 145 mmol/L   Potassium 4.3 3.5 - 5.1 mmol/L   Chloride 97 (L) 101 - 111 mmol/L   CO2 29 22 - 32 mmol/L   Glucose, Bld 178 (H) 65 - 99 mg/dL   BUN 17 6 - 20 mg/dL   Creatinine, Ser 1.61 (H) 0.61 - 1.24 mg/dL   Calcium 9.5 8.9 - 09.6 mg/dL   Total Protein 7.2 6.5 - 8.1 g/dL   Albumin 4.3 3.5 - 5.0 g/dL   AST 22 15 - 41 U/L   ALT 13 (L) 17 - 63 U/L   Alkaline Phosphatase 55 38 - 126 U/L   Total Bilirubin 1.2 0.3 - 1.2 mg/dL   GFR calc non Af Amer 52 (L) >60 mL/min   GFR calc Af Amer >60 >60 mL/min   Anion gap 11 5 - 15  CBC with Differential  Result Value Ref Range   WBC 10.4 4.0 - 10.5 K/uL   RBC 4.59 4.22 - 5.81 MIL/uL   Hemoglobin 15.8 13.0 - 17.0 g/dL   HCT 04.5 40.9 - 81.1 %   MCV 98.3 78.0 - 100.0 fL   MCH 34.4 (H) 26.0 - 34.0 pg   MCHC 35.0 30.0 - 36.0 g/dL   RDW 91.4 78.2 - 95.6 %   Platelets 176 150 - 400 K/uL   Neutrophils Relative % 66 %   Neutro Abs 6.8 1.7 - 7.7 K/uL   Lymphocytes Relative 24 %   Lymphs Abs 2.5  0.7 - 4.0 K/uL   Monocytes Relative 8 %   Monocytes Absolute 0.8 0.1 - 1.0 K/uL   Eosinophils Relative 2 %   Eosinophils Absolute 0.2 0.0 - 0.7 K/uL   Basophils Relative 0 %   Basophils Absolute 0.0 0.0 - 0.1 K/uL  Troponin I  Result Value Ref Range   Troponin I 0.05 (H) <0.031 ng/mL  Digoxin level  Result Value Ref Range   Digoxin Level 1.5 0.8 - 2.0 ng/mL  Magnesium  Result Value Ref Range   Magnesium 1.6  (L) 1.7 - 2.4 mg/dL  Urinalysis, Routine w reflex microscopic  Result Value Ref Range   Color, Urine YELLOW YELLOW   APPearance CLEAR CLEAR   Specific Gravity, Urine 1.010 1.005 - 1.030   pH 6.0 5.0 - 8.0   Glucose, UA NEGATIVE NEGATIVE mg/dL   Hgb urine dipstick SMALL (A) NEGATIVE   Bilirubin Urine NEGATIVE NEGATIVE   Ketones, ur NEGATIVE NEGATIVE mg/dL   Protein, ur 30 (A) NEGATIVE mg/dL   Nitrite NEGATIVE NEGATIVE   Leukocytes, UA NEGATIVE NEGATIVE  Troponin I  Result Value Ref Range   Troponin I 0.05 (H) <0.031 ng/mL  Urine microscopic-add on  Result Value Ref Range   Squamous Epithelial / LPF 0-5 (A) NONE SEEN   WBC, UA NONE SEEN 0 - 5 WBC/hpf   RBC / HPF 0-5 0 - 5 RBC/hpf   Bacteria, UA NONE SEEN NONE SEEN  CBG monitoring, ED  Result Value Ref Range   Glucose-Capillary 163 (H) 65 - 99 mg/dL    Laboratory interpretation all normal except borderline low magnesium, + troponin, unchanged delta troponin   Ct Head Wo Contrast  07/12/2015  CLINICAL DATA:  Hypertension.  Weakness, onset yesterday. EXAM: CT HEAD WITHOUT CONTRAST TECHNIQUE: Contiguous axial images were obtained from the base of the skull through the vertex without intravenous contrast. COMPARISON:  11/13/2014 FINDINGS: There is no intracranial hemorrhage, mass or evidence of acute infarction. There is no extra-axial fluid collection. There is generalized atrophy. No interval change is evident from 11/13/2014. No bony abnormalities evident. The visible paranasal sinuses are clear. IMPRESSION: Stable generalized atrophy.  No acute intracranial findings. Electronically Signed   By: Ellery Plunk M.D.   On: 07/12/2015 03:52      Imaging Review Ct Head Wo Contrast  07/12/2015  CLINICAL DATA:  Hypertension.  Weakness, onset yesterday. EXAM: CT HEAD WITHOUT CONTRAST TECHNIQUE: Contiguous axial images were obtained from the base of the skull through the vertex without intravenous contrast. COMPARISON:  11/13/2014  FINDINGS: There is no intracranial hemorrhage, mass or evidence of acute infarction. There is no extra-axial fluid collection. There is generalized atrophy. No interval change is evident from 11/13/2014. No bony abnormalities evident. The visible paranasal sinuses are clear. IMPRESSION: Stable generalized atrophy.  No acute intracranial findings. Electronically Signed   By: Ellery Plunk M.D.   On: 07/12/2015 03:52   I have personally reviewed and evaluated these images and lab results as part of my medical decision-making.   EKG Interpretation   Date/Time:  Wednesday July 12 2015 01:41:28 EDT Ventricular Rate:  103 PR Interval:    QRS Duration: 144 QT Interval:  351 QTC Calculation: 459 R Axis:   -63 Text Interpretation:  Atrial fibrillation Nonspecific IVCD with LAD  Borderline T abnormalities, lateral leads Since last tracing rate faster  13 Nov 2014 Confirmed by Haivyn Oravec  MD-I, Lark Langenfeld ([id number]) on 07/12/2015 1:52:47 AM      MDM   Final diagnoses:  Weakness    Plan discharge  Devoria Albe, MD, Concha Pyo, MD 07/12/15 [phone]

## 2015-07-12 NOTE — ED Notes (Signed)
Pt c/o feeling weak all day.

## 2015-07-12 NOTE — Discharge Instructions (Signed)
Your testing tonight does not explain why you aren't feeling well. Follow up with Dr Janna Arch or Dr [name] soon. Try to drink more fluids today, like a bottle of gatorade.

## 2015-07-12 NOTE — ED Notes (Addendum)
Pt c/o overall weakness that started today. Denies N/V/D, pain, or ICD firing.

## 2015-07-12 NOTE — ED Notes (Signed)
Pt upset about care and having to stay here so long. Pt upset due to not finding out what was wrong with him and would not sign discharge paperwork.

## 2015-07-12 NOTE — ED Notes (Signed)
Pt states BP reading at home was 121/100.

## 2015-07-14 LAB — LEVETIRACETAM LEVEL: Levetiracetam Lvl: 35.5 ug/mL (ref 10.0–40.0)

## 2015-07-17 NOTE — Telephone Encounter (Signed)
Third attempt:  Phone rang multiple times, no answering machine came on.  Encounter closed.

## 2015-10-05 ENCOUNTER — Ambulatory Visit (INDEPENDENT_AMBULATORY_CARE_PROVIDER_SITE_OTHER): Payer: Medicare HMO | Admitting: *Deleted

## 2015-10-05 DIAGNOSIS — I255 Ischemic cardiomyopathy: Secondary | ICD-10-CM | POA: Diagnosis not present

## 2015-10-06 NOTE — Progress Notes (Signed)
Remote ICD transmission.   

## 2015-10-11 LAB — CUP PACEART REMOTE DEVICE CHECK
Brady Statistic RV Percent Paced: 3 %
Date Time Interrogation Session: 20170629102200
HighPow Impedance: 83 Ohm
Implantable Lead Implant Date: 20140512
Implantable Lead Location: 753860
Implantable Lead Serial Number: 126556
Lead Channel Impedance Value: 406 Ohm
Lead Channel Setting Sensing Sensitivity: 0.4 mV
MDC IDC LEAD MODEL: 292
MDC IDC MSMT BATTERY REMAINING LONGEVITY: 126 mo
MDC IDC MSMT BATTERY REMAINING PERCENTAGE: 100 %
MDC IDC SET LEADCHNL RV PACING AMPLITUDE: 2.4 V
MDC IDC SET LEADCHNL RV PACING PULSEWIDTH: 0.4 ms
Pulse Gen Serial Number: 106496

## 2015-10-13 ENCOUNTER — Encounter: Payer: Self-pay | Admitting: Cardiology

## 2015-11-10 ENCOUNTER — Other Ambulatory Visit: Payer: Self-pay | Admitting: Neurology

## 2015-11-27 ENCOUNTER — Telehealth: Payer: Self-pay | Admitting: Neurology

## 2015-11-27 NOTE — Telephone Encounter (Signed)
Pt called to advise over the past 3-4 mths he has difficulty sleeping, walking, mind racing, pt did say he felt he could do harm to himself or someone else. Pt does not seem anxious or angry. He did state if he misses a dose he does not have these issues. An appt has been scheduled for tomorrow 8/22 @ 4pm. Please call pt today, he does not want to take the medication today, he is wanting to discontinue it

## 2015-11-27 NOTE — Telephone Encounter (Signed)
I called patient. He is having some suicidal ideation on the Keppra, he may need to come off the medication, but tonight he can take only 500 mg instead 1000 mg, he will be seen for an evaluation tomorrow. He is not sleeping well on the medication.

## 2015-11-27 NOTE — Telephone Encounter (Signed)
Pt also said he "did not take levetiracetam yesterday or today and he has a good mind today".

## 2015-11-28 ENCOUNTER — Ambulatory Visit (INDEPENDENT_AMBULATORY_CARE_PROVIDER_SITE_OTHER): Payer: Medicare HMO | Admitting: Neurology

## 2015-11-28 ENCOUNTER — Encounter: Payer: Self-pay | Admitting: Neurology

## 2015-11-28 ENCOUNTER — Ambulatory Visit: Payer: Medicare HMO | Admitting: Neurology

## 2015-11-28 VITALS — BP 105/61 | HR 74 | Ht 64.5 in | Wt 159.2 lb

## 2015-11-28 DIAGNOSIS — G40209 Localization-related (focal) (partial) symptomatic epilepsy and epileptic syndromes with complex partial seizures, not intractable, without status epilepticus: Secondary | ICD-10-CM

## 2015-11-28 DIAGNOSIS — G4700 Insomnia, unspecified: Secondary | ICD-10-CM | POA: Diagnosis not present

## 2015-11-28 DIAGNOSIS — R413 Other amnesia: Secondary | ICD-10-CM

## 2015-11-28 DIAGNOSIS — I609 Nontraumatic subarachnoid hemorrhage, unspecified: Secondary | ICD-10-CM | POA: Diagnosis not present

## 2015-11-28 MED ORDER — DIVALPROEX SODIUM ER 500 MG PO TB24: 1000.0000 mg | ORAL_TABLET | Freq: Every day | ORAL | 11 refills | Status: DC

## 2015-11-28 NOTE — Progress Notes (Signed)
PATIENT: [name] DOB: 11-08-1934  Chief Complaint  Patient presents with  . Seizures    He is here with his wife, [name] PitterFlora and his daughter, [name].  No seizure activity reported.  Feels Keppra is causing adverse side effects.  Reports sleep disturbance, racing mind, agitation, suicidal thoughts, decreased appetite and weight loss.       HISTORICAL  [name] 81 yo RH, wife, daughter, , seen in refer by his primary care physician [name], [name] for seizure, he is accompanied by his wife, and daughter [name] at today's clinical visit  He had past medical history of hypertension, hyperlipidemia, diabetes, coronary artery disease, status post stent, atrial fibrillation, was taking coumadin, stopped in June 30th 2016, not on anticoagulation at this point,  He also had a history of ICD, not MRI candidate, ischemic congestive heart failure, ejection fraction 25-30%.  In October 06 2014, he fell backwards sitting in the the chair, with transient loss of consciousness for a few minutes, later he was noted by family member to have confusion, could not recognize his friend, he was taken by ambulance to [facility] on October 06 2014, I have reviewed at CAT scan of the brain, mild generalized atrophy, more at bilateral frontal region, no acute intracranial abnormality. He also suffered left fifth rib fracture by chest x-ray.  He was discharged home, on July second 2016, he began to complains of worsening diffuse headaches, presented to local emergency room, I have reviewed CAT scan on July 2nd 2016, there was diffuse bilateral frontal atrophy, no acute lesions.   On July third 2016, while watching TV, he had his first seizure, eyes rolled back, right hand drawn up followed by loss of consciousness tonic-clonic movement.   He had recurrent seizure on October 16 2014, again presented to the emergency room, repeat CAT scan in October 16 2014 showed mixed the density subdural hematoma on the left,  6 mm in the left frontal region.  5 mm right frontal subdural hematoma. EEG was normal October 17 2014 Due to subdural hematoma, Coumadin was stopped, the plan was to start Eliquis in one wee.  Most recent seizure was October 20 2014, started from his right hand, difficulty controlling his right hand, followed by loss of consciousness, right hand arm contraction, post event confusion lasted 10-15 minutes, he is now taking Keppra 1000 mg twice a day, tolerating it well, he has no recurrent seizure  UPDATE August 22nd 2017: He came in with his wife and daughter at today's clinical visit, last visit was in August 2016, he has lost follow-up, he had a history of atrial fibrillation, his cardiologist is Dr. Tresa EndoKelly, he is no longer on anticoagulation or antiplatelet agent, there was no recurrent seizure, he is still taking Keppra 1000 mg twice a day, he complains of agitation, could not sleep, he contributed to the side effect of Keppra,  I reviewed his emergency room visit in April 2017, for similar complaints, I personally reviewed CT head without contrast in April 2017, generalized atrophy, periventricular small vessel disease, no interval change compared to August 2016.  REVIEW OF SYSTEMS: Full 14 system review of systems performed and notable only for insomnia, Agitation, memory loss, behavior problem, confusion decreased concentration, depression anxiety hallucination suicidal thoughts, hyperactivity, self injury, weakness, tremor, joint pain, swelling, walking difficulty, cold and heat intolerance, excessive thirst, rectal bleeding, nausea, restless leg, insomnia, daytime sleepiness, eye itching, eye pain, blurry vision, shortness of breath, choking, chest tightness, leg  swelling, appetite change, activity change, chill, fatigue, unexpected weight change, excessive sweating, hearing loss, ear pain, ringing ears, runny nose, drooling  ALLERGIES: Allergies  Allergen Reactions  . Lipitor [Atorvastatin] Other  (See Comments)    myalgias   . Percocet [Oxycodone-Acetaminophen] Nausea And Vomiting    Needs to drink milk and take a Zofran  . Procaine Hcl Nausea And Vomiting  . Tramadol Nausea And Vomiting  . Vicodin [Hydrocodone-Acetaminophen] Nausea And Vomiting    HOME MEDICATIONS: Current Outpatient Prescriptions  Medication Sig Dispense Refill  . digoxin (LANOXIN) 0.25 MG tablet Take 1 tablet (0.25 mg total) by mouth daily. 30 tablet 3  . levETIRAcetam (KEPPRA) 1000 MG tablet Take 1 tablet (1,000 mg total) by mouth 2 (two) times daily. 60 tablet 3  . magnesium oxide (MAG-OX) 400 MG tablet Take 1 tablet (400 mg total) by mouth 2 (two) times daily. 60 tablet 2  . metFORMIN (GLUCOPHAGE) 1000 MG tablet Take 1 tablet (1,000 mg total) by mouth 2 (two) times daily with a meal. 60 tablet 3  . pravastatin (PRAVACHOL) 40 MG tablet Take 1 tablet (40 mg total) by mouth daily. 30 tablet 3     PAST MEDICAL HISTORY: Past Medical History:  Diagnosis Date  . Arteriosclerotic cardiovascular disease (ASCVD)   . At risk for sudden cardiac death 08-28-2012  . Atrial fibrillation (HCC)    Onset in 2012  . Atrial flutter (HCC)   . Bleeding in brain due to brain aneurysm [facility] October 06 2014  . CHF (congestive heart failure) (HCC)   . Chronic anticoagulation 2012   2012  . COPD (chronic obstructive pulmonary disease) (HCC)   . DJD (degenerative joint disease)   . Gout   . Hyperlipidemia   . ICD (implantable cardiac defibrillator) in place   . Ischemic cardiomyopathy   . Kidney stone    "just once" (08/17/2012)  . Myocardial infarction (HCC) 1998  . NICM (nonischemic cardiomyopathy), EF 25-30% 2012/08/28  . Obstructive sleep apnea    "went away when I lost a bunch of weight" (08/17/2012)  . Permanent atrial fibrillation (HCC) 02/15/2011   Initial onset in 03/2011 with rapid ventricular response   . S/P ICD (internal cardiac defibrillator) procedure, 08/17/12, [employer] 08/28/12   [facility]    . Type II diabetes mellitus (HCC)     PAST SURGICAL HISTORY: Past Surgical History:  Procedure Laterality Date  . CARDIAC CATHETERIZATION  02/2003  . CARDIAC DEFIBRILLATOR PLACEMENT  08/17/2012   [employer]  . CATARACT EXTRACTION W/ INTRAOCULAR LENS  IMPLANT, BILATERAL Bilateral ~ 2011  . CHOLECYSTECTOMY  2009  . CORONARY ANGIOPLASTY WITH STENT PLACEMENT  07/14/1996   "1" (08/17/2012)  . CYSTOSCOPY/RETROGRADE/URETEROSCOPY  06/28/2011   Procedure: CYSTOSCOPY/RETROGRADE/URETEROSCOPY;  Surgeon: Ky Barban, MD;  Location: [facility];  Service: Urology;  Laterality: Right;  . IMPLANTABLE CARDIOVERTER DEFIBRILLATOR IMPLANT N/A 08/17/2012   Procedure: IMPLANTABLE CARDIOVERTER DEFIBRILLATOR IMPLANT;  Surgeon: Thurmon Fair, MD;  Location: [facility];  Service: Cardiovascular;  Laterality: N/A;  . KNEE ARTHROPLASTY Left 1978   "tendon & cartilege repair" (08/17/2012)  . Myoview perfusion scan  06/02/2012   low risk, extensive scar entire LAD & RCA territory  . s/p icd  08/2012   [employer] [facility]  . STONE EXTRACTION WITH BASKET  06/28/2011   Procedure: STONE EXTRACTION WITH BASKET;  Surgeon: Ky Barban, MD;  Location: [facility];  Service: Urology;  Laterality: Right;  specimen given to family per MD  . US ECHOCARDIOGRAPHY  07/08/2012  EF <20%,mild MR,TR,LA severely dilated  . VASECTOMY  ~ 1964    FAMILY HISTORY: Family History  Problem Relation Age of Onset  . Heart failure Mother   . Heart failure Father     SOCIAL HISTORY:  Social History   Social History  . Marital status: Married    Spouse name: N/A  . Number of children: 3  . Years of education: 9th   Occupational History  . Retired    Social History Main Topics  . Smoking status: Former Smoker    Packs/day: 2.00    Years: 40.00    Types: Cigarettes    Quit date: 04/08/1992  . Smokeless tobacco: Former Neurosurgeon  . Alcohol use No     Comment: 08/17/2012 "quit drinking in 1983"  . Drug use: No  . Sexual activity: No    Other Topics Concern  . Not on file   Social History Narrative   Lives at home with his wife.   Right-handed.   No caffeine use.   PHYSICAL EXAM   Vitals:   11/28/15 1123  BP: 105/61  Pulse: 74  Weight: 159 lb 4 oz (72.2 kg)  Height: 5' 4.5" (1.638 m)    Not recorded      Body mass index is 26.91 kg/m.  PHYSICAL EXAMNIATION:  Gen: NAD, conversant, well nourised, obese, well groomed                     Cardiovascular: Regular rate rhythm, no peripheral edema, warm, nontender. Eyes: Conjunctivae clear without exudates or hemorrhage Neck: Supple, no carotid bruise. Pulmonary: Clear to auscultation bilaterally   NEUROLOGICAL EXAM:  MENTAL STATUS: Speech:    Speech is normal; fluent and spontaneous with normal comprehension.  Cognition:     Orientation to time, place and person     Normal recent and remote memory     Normal Attention span and concentration     Normal Language, naming, repeating,spontaneous speech     Fund of knowledge   CRANIAL NERVES: CN II: Visual fields are full to confrontation. Fundoscopic exam is normal with sharp discs and no vascular changes. Pupils are round equal and briskly reactive to light. CN III, IV, VI: extraocular movement are normal. No ptosis. CN V: Facial sensation is intact to pinprick in all 3 divisions bilaterally. Corneal responses are intact.  CN VII: Face is symmetric with normal eye closure and smile. CN VIII: Hearing is normal to rubbing fingers CN IX, X: Palate elevates symmetrically. Phonation is normal. CN XI: Head turning and shoulder shrug are intact CN XII: Tongue is midline with normal movements and no atrophy.  MOTOR: There is no pronator drift of out-stretched arms. Muscle bulk and tone are normal. Muscle strength is normal.  REFLEXES: Reflexes are 2+ and symmetric at the biceps, triceps, knees, and ankles. Plantar responses are flexor.  SENSORY: Intact to light touch, pinprick, position sense, and  vibration sense are intact in fingers and toes.  COORDINATION: Rapid alternating movements and fine finger movements are intact. There is no dysmetria on finger-to-nose and heel-knee-shin.    GAIT/STANCE: Posture is normal. Gait is steady with normal steps, base, arm swing, and turning. Heel and toe walking are normal. Mild difficulty with tandem walking  DIAGNOSTIC DATA (LABS, IMAGING, TESTING) - I reviewed patient records, labs, notes, testing and imaging myself where available.   ASSESSMENT AND PLAN  KUSHAL SAUNDERS is a 81 y.o. male  is complicated past medical history, history of  atrial fibrillation, was on chronic Coumadin treatment, ischemic congestive heart failure, ejection fraction 25-30%  Subdural hemorrhage in October 2016:   History of chronic atrial fibrillation, was on Coumadin, Coumadin has been on hold since October 16 2014,   Repeat CAT scan in April 2017 showed chronic atrophy, periventricular small vessel disease    Chronic atrial fibrillation   baby aspirin daily  Complex partial seizure:   Reported agitation with Keppra 1000 mg twice a day, last seizure was in July 2016  No driving until seizure free for 6 months  Agitation, chronic insomnia:  Stopped Keppra  Start Depakote ER 500 mg 2 tablets every night,  If he showing improvement, continue to be seizure-free, may taper down to 1 tablet every night     Terrace Arabia, M.D. Ph.D.  [facility] [address], [address] [place], [place] [address] Ph: [phone] Fax: [phone]  CC: To Dr. Oval Linsey

## 2015-11-29 ENCOUNTER — Telehealth: Payer: Self-pay | Admitting: Neurology

## 2015-11-29 DIAGNOSIS — E538 Deficiency of other specified B group vitamins: Secondary | ICD-10-CM

## 2015-11-29 LAB — RPR: RPR: NONREACTIVE

## 2015-11-29 LAB — TSH: TSH: 2.1 u[IU]/mL (ref 0.450–4.500)

## 2015-11-29 LAB — VITAMIN B12: Vitamin B-12: 46 pg/mL — ABNORMAL LOW (ref 211–946)

## 2015-11-29 NOTE — Telephone Encounter (Addendum)
Spoke to [name] - he would like to get his repeat labs and injections at his PCP's office (Dr. Oval Linsey [phone]) because it is much closer to his home.

## 2015-11-29 NOTE — Telephone Encounter (Signed)
Please call patient, laboratory showed severely decreased vitamin B-12 level only 46,  He needs to come in for repeat laboratory evaluation, order was place,  And start vitamin B12 IM supplement, 1000 g daily, every day for 1 week, every week for one month, then monthly basis.

## 2015-11-30 NOTE — Telephone Encounter (Signed)
Pt called said he has not heard back from [facility] B12. He said he called PCP's office and they don't answer.   Pt also needs to know if he can take divalproex (DEPAKOTE ER) 500 MG 24 hr tablet 1 or 2 hrs before going to bed. He said he is not going to bed until about 11:30 but he is not going to sleep until 5am which is an improvement. He has taken this medication for the past 2 nights. He is wanting to try to go to sleep earlier.

## 2015-11-30 NOTE — Telephone Encounter (Signed)
I faxed orders to his PCP earlier.  I will re- fax them again.  I attempted to call them but the office closed at 3:30.  I will follow up to make sure this patient gets scheduled.  He is going to try to take his Depakote a little earlier tonight so he can get in bed sooner.  He is very pleased with the quality of his sleep since starting this medication.

## 2015-12-22 ENCOUNTER — Emergency Department (HOSPITAL_COMMUNITY): Payer: Medicare HMO

## 2015-12-22 ENCOUNTER — Encounter (HOSPITAL_COMMUNITY): Payer: Self-pay

## 2015-12-22 ENCOUNTER — Emergency Department (HOSPITAL_COMMUNITY)
Admission: EM | Admit: 2015-12-22 | Discharge: 2015-12-23 | Disposition: A | Discharge status: Home / Self Care | Payer: Medicare HMO | Attending: Emergency Medicine | Admitting: Emergency Medicine

## 2015-12-22 DIAGNOSIS — Z7984 Long term (current) use of oral hypoglycemic drugs: Secondary | ICD-10-CM | POA: Diagnosis not present

## 2015-12-22 DIAGNOSIS — Z7982 Long term (current) use of aspirin: Secondary | ICD-10-CM | POA: Diagnosis not present

## 2015-12-22 DIAGNOSIS — Z87891 Personal history of nicotine dependence: Secondary | ICD-10-CM | POA: Diagnosis not present

## 2015-12-22 DIAGNOSIS — I509 Heart failure, unspecified: Secondary | ICD-10-CM | POA: Diagnosis not present

## 2015-12-22 DIAGNOSIS — E119 Type 2 diabetes mellitus without complications: Secondary | ICD-10-CM | POA: Diagnosis not present

## 2015-12-22 DIAGNOSIS — M7989 Other specified soft tissue disorders: Secondary | ICD-10-CM | POA: Insufficient documentation

## 2015-12-22 DIAGNOSIS — Z79899 Other long term (current) drug therapy: Secondary | ICD-10-CM | POA: Insufficient documentation

## 2015-12-22 DIAGNOSIS — J449 Chronic obstructive pulmonary disease, unspecified: Secondary | ICD-10-CM | POA: Insufficient documentation

## 2015-12-22 DIAGNOSIS — R0602 Shortness of breath: Secondary | ICD-10-CM | POA: Diagnosis present

## 2015-12-22 MED ORDER — NITROGLYCERIN 2 % TD OINT
1.0000 [in_us] | TOPICAL_OINTMENT | Freq: Once | TRANSDERMAL | Status: AC
  Administered 2015-12-23: 1 [in_us] via TOPICAL
  Filled 2015-12-22: qty 1

## 2015-12-22 MED ORDER — FUROSEMIDE 10 MG/ML IJ SOLN
60.0000 mg | Freq: Once | INTRAMUSCULAR | Status: AC
  Administered 2015-12-23: 60 mg via INTRAVENOUS
  Filled 2015-12-22: qty 6

## 2015-12-22 NOTE — ED Triage Notes (Signed)
Pt reports sob, increased edema in his legs, and decreased urination.  Pt denies pain

## 2015-12-22 NOTE — ED Provider Notes (Signed)
[facility]Y DEPT Provider Note   CSN: [id number] Arrival date & time: 12/22/15  2320  By signing my name below, I, Vista Mink, attest that this documentation has been prepared under the direction and in the presence of Devoria Albe, MD. Electronically signed, Vista Mink, ED Scribe. 12/22/15. 11:39 PM.  Time Seen 23:33 AM  History   Chief Complaint Chief Complaint  Patient presents with  . Shortness of Breath    HPI HPI Comments: MONG NEAL is a 81 y.o. male with a PMHx of DM, COPD, A-fib, CHF, who presents to the Emergency Department complaining of gradually worsening shortness of breath and bilateral leg swelling onset two days ago. He also reports decreased urine output and states "I feel like my fluid pills stopped working two days ago". Pt has gained 3-4 pounds in the past two days. He has Hx of similar symptoms and fluid retention. He does not know the cause. Pt does not currently smoke and does not drink alcohol. No chest pain, cough or fever noted. He does state his abdomen feels swollen. He denies PND and only sleeps on 1 pillow.   PCP Dr Janna Arch Cardiology Dr [name]   The history is provided by the patient. No language interpreter was used.    Past Medical History:  Diagnosis Date  . Arteriosclerotic cardiovascular disease (ASCVD)   . At risk for sudden cardiac death 08-21-12  . Atrial fibrillation (HCC)    Onset in 2012  . Atrial flutter (HCC)   . Bleeding in brain due to brain aneurysm [facility]) October 06 2014  . CHF (congestive heart failure) (HCC)   . Chronic anticoagulation 2012   2012  . COPD (chronic obstructive pulmonary disease) (HCC)   . DJD (degenerative joint disease)   . Gout   . Hyperlipidemia   . ICD (implantable cardiac defibrillator) in place   . Ischemic cardiomyopathy   . Kidney stone    "just once" (08/17/2012)  . Myocardial infarction (HCC) 1998  . NICM (nonischemic cardiomyopathy), EF 25-30% 21-Aug-2012  . Obstructive sleep apnea    "went  away when I lost a bunch of weight" (08/17/2012)  . Permanent atrial fibrillation (HCC) 02/15/2011   Initial onset in 03/2011 with rapid ventricular response   . S/P ICD (internal cardiac defibrillator) procedure, 08/17/12, [employer] 08-21-2012   [employer]  . Type II diabetes mellitus [facility])     Patient Active Problem List   Diagnosis Date Noted  . Acute on chronic combined systolic and diastolic CHF (congestive heart failure) (HCC) 02/17/2015  . Chest pain 10/21/2014  . Transient alteration of awareness   . Systolic CHF, chronic (HCC)   . Subarachnoid hemorrhage (HCC)   . Cerebral thrombosis with cerebral infarction (HCC) 10/17/2014  . Seizure-like activity (HCC) 10/16/2014  . Syncope   . Subdural hematoma (HCC)   . Syncope and collapse   . Chronic systolic heart failure (HCC)   . Elevated troponin   . Other emphysema (HCC)   . Diabetes type 2, uncontrolled (HCC)   . HLD (hyperlipidemia)   . ACS (acute coronary syndrome) (HCC) 04/05/2014  . Sepsis (HCC) 04/05/2014  . Coronary artery disease 01/05/2014  . Cardiomyopathy, ischemic 03/26/2013  . Obstructive sleep apnea 03/26/2013  . Chronic combined systolic and diastolic CHF, NYHA class 2 (HCC) 09/24/2012  . At risk for sudden cardiac death 2012/08/21  . S/P ICD (internal cardiac defibrillator) procedure, 08/17/12, [employer] implanted 2012-08-21  . Fall 02/28/2012  . Traumatic subarachnoid hemorrhage (HCC) 02/28/2012  .  Multiple fractures of ribs of left side 02/28/2012  . Hyperkalemia 06/25/2011  . Ureterolithiasis 06/25/2011  . Laboratory test 04/29/2011  . COPD (chronic obstructive pulmonary disease) (HCC)   . Chronic anticoagulation, coumadin   . Permanent atrial fibrillation (HCC) 02/15/2011  . DIABETES MELLITUS, TYPE II 10/07/2008  . OBSTRUCTIVE SLEEP APNEA 10/07/2008  . Arteriosclerotic cardiovascular disease (ASCVD) 10/07/2008    Past Surgical History:  Procedure Laterality Date  . CARDIAC  CATHETERIZATION  02/2003  . CARDIAC DEFIBRILLATOR PLACEMENT  08/17/2012   [employer]  . CATARACT EXTRACTION W/ INTRAOCULAR LENS  IMPLANT, BILATERAL Bilateral ~ 2011  . CHOLECYSTECTOMY  2009  . CORONARY ANGIOPLASTY WITH STENT PLACEMENT  07/14/1996   "1" (08/17/2012)  . CYSTOSCOPY/RETROGRADE/URETEROSCOPY  06/28/2011   Procedure: CYSTOSCOPY/RETROGRADE/URETEROSCOPY;  Surgeon: Ky Barban, MD;  Location: [facility] ORS;  Service: Urology;  Laterality: Right;  . IMPLANTABLE CARDIOVERTER DEFIBRILLATOR IMPLANT N/A 08/17/2012   Procedure: IMPLANTABLE CARDIOVERTER DEFIBRILLATOR IMPLANT;  Surgeon: Thurmon Fair, MD;  Location: [facility] CATH [facility];  Service: Cardiovascular;  Laterality: N/A;  . KNEE ARTHROPLASTY Left 1978   "tendon & cartilege repair" (08/17/2012)  . Myoview perfusion scan  06/02/2012   low risk, extensive scar entire LAD & RCA territory  . s/p icd  08/2012   [employer]  . STONE EXTRACTION WITH BASKET  06/28/2011   Procedure: STONE EXTRACTION WITH BASKET;  Surgeon: Ky Barban, MD;  Location: [facility] ORS;  Service: Urology;  Laterality: Right;  specimen given to family per MD  . US ECHOCARDIOGRAPHY  07/08/2012   EF <20%,mild MR,TR,LA severely dilated  . VASECTOMY  ~ 1964       Home Medications    Prior to Admission medications   Medication Sig Start Date End Date Taking? Authorizing Provider  aspirin EC 81 MG tablet Take 81 mg by mouth daily.    Historical Provider, MD  digoxin (LANOXIN) 0.25 MG tablet Take 1 tablet (0.25 mg total) by mouth daily. 10/23/14 09/20/17  Ripudeep Jenna Luo, MD  divalproex (DEPAKOTE ER) 500 MG 24 hr tablet Take 2 tablets (1,000 mg total) by mouth at bedtime. 11/28/15   Levert Feinstein, MD  furosemide (LASIX) 40 MG tablet TAKE 1 AND 1/2 TABLETS ONCE DAILY 02/15/15   Mihai Croitoru, MD  magnesium oxide (MAG-OX) 400 MG tablet Take 1 tablet (400 mg total) by mouth 2 (two) times daily. 10/23/14   Ripudeep Jenna Luo, MD  metFORMIN (GLUCOPHAGE) 1000 MG tablet Take 1 tablet (1,000 mg total)  by mouth 2 (two) times daily with a meal. 10/23/14   Ripudeep K Rai, MD  potassium chloride (K-DUR) 10 MEQ tablet Take 1 tablet (10 mEq total) by mouth daily. 02/15/15   Mihai Croitoru, MD  pravastatin (PRAVACHOL) 40 MG tablet Take 1 tablet (40 mg total) by mouth daily. 10/23/14   Ripudeep Jenna Luo, MD    Family History Family History  Problem Relation Age of Onset  . Heart failure Mother   . Heart failure Father     Social History Social History  Substance Use Topics  . Smoking status: Former Smoker    Packs/day: 2.00    Years: 40.00    Types: Cigarettes    Quit date: 04/08/1992  . Smokeless tobacco: Former Neurosurgeon  . Alcohol use No     Comment: 08/17/2012 "quit drinking in 1983"  lives at home Lives with spouse   Allergies   Lipitor [atorvastatin]; Percocet [oxycodone-acetaminophen]; Procaine hcl; Tramadol; and Vicodin [hydrocodone-acetaminophen]   Review of Systems Review of Systems  Constitutional:  Negative for fever.  Respiratory: Positive for shortness of breath. Negative for cough.   Cardiovascular: Positive for leg swelling (bilateral). Negative for chest pain.  Genitourinary: Positive for decreased urine volume.  All other systems reviewed and are negative.    Physical Exam Updated Vital Signs Ht 5' 4.5" (1.638 m)   Wt 159 lb (72.1 kg)   BMI 26.87 kg/m  ED Triage Vitals  Enc Vitals Group     BP 12/22/15 2337 116/82     Pulse Rate 12/22/15 2337 84     Resp 12/22/15 2337 20     Temp 12/22/15 2337 97.6 F (36.4 C)     Temp Source 12/22/15 2337 Oral     SpO2 12/22/15 2337 97 %     Weight 12/22/15 2329 159 lb (72.1 kg)     Height 12/22/15 2329 5' 4.5" (1.638 m)     Head Circumference --      Peak Flow --      Pain Score 12/22/15 2350 0     Pain Loc --      Pain Edu? --      Excl. in GC? --    Vital signs normal    Physical Exam  Constitutional: He is oriented to person, place, and time. He appears well-developed and well-nourished.  Non-toxic appearance.  He does not appear ill. No distress.  Laying flat in bed  HENT:  Head: Normocephalic and atraumatic.  Right Ear: External ear normal.  Left Ear: External ear normal.  Nose: Nose normal. No mucosal edema or rhinorrhea.  Mouth/Throat: Oropharynx is clear and moist and mucous membranes are normal. No dental abscesses or uvula swelling.  Eyes: Conjunctivae and EOM are normal. Pupils are equal, round, and reactive to light.  Neck: Normal range of motion and full passive range of motion without pain. Neck supple.  Cardiovascular: Normal rate and normal heart sounds.  An irregular rhythm present. Exam reveals no gallop and no friction rub.   No murmur heard. Pulmonary/Chest: Effort normal and breath sounds normal. No respiratory distress. He has no wheezes. He has no rhonchi. He has no rales. He exhibits no tenderness and no crepitus.  Abdominal: Soft. Normal appearance and bowel sounds are normal. He exhibits no distension. There is no tenderness. There is no rebound and no guarding.  Musculoskeletal: Normal range of motion. He exhibits edema. He exhibits no tenderness.  2+ pitting edema up to his knees.  Neurological: He is alert and oriented to person, place, and time. He has normal strength. No cranial nerve deficit.  Skin: Skin is warm, dry and intact. No rash noted. No erythema. No pallor.  Psychiatric: He has a normal mood and affect. His speech is normal and behavior is normal. His mood appears not anxious.  Nursing note and vitals reviewed.    ED Treatments / Results  Labs (all labs ordered are listed, but only abnormal results are displayed) Results for orders placed or performed during the hospital encounter of 12/22/15  Comprehensive metabolic panel  Result Value Ref Range   Sodium 134 (L) 135 - 145 mmol/L   Potassium 6.0 (H) 3.5 - 5.1 mmol/L   Chloride 94 (L) 101 - 111 mmol/L   CO2 26 22 - 32 mmol/L   Glucose, Bld 157 (H) 65 - 99 mg/dL   BUN 31 (H) 6 - 20 mg/dL   Creatinine,  Ser 1.61 (H) 0.61 - 1.24 mg/dL   Calcium 9.1 8.9 - 09.6 mg/dL   Total Protein 6.5  6.5 - 8.1 g/dL   Albumin 4.1 3.5 - 5.0 g/dL   AST 55 (H) 15 - 41 U/L   ALT 21 17 - 63 U/L   Alkaline Phosphatase 49 38 - 126 U/L   Total Bilirubin 2.2 (H) 0.3 - 1.2 mg/dL   GFR calc non Af Amer 44 (L) >60 mL/min   GFR calc Af Amer 51 (L) >60 mL/min   Anion gap 14 5 - 15  Troponin I  Result Value Ref Range   Troponin I 0.06 (HH) <0.03 ng/mL  Brain natriuretic peptide  Result Value Ref Range   B Natriuretic Peptide 1,317.0 (H) 0.0 - 100.0 pg/mL  CBC with Differential  Result Value Ref Range   WBC 13.1 (H) 4.0 - 10.5 K/uL   RBC 4.70 4.22 - 5.81 MIL/uL   Hemoglobin 15.8 13.0 - 17.0 g/dL   HCT 41.6 60.6 - 30.1 %   MCV 102.8 (H) 78.0 - 100.0 fL   MCH 33.6 26.0 - 34.0 pg   MCHC 32.7 30.0 - 36.0 g/dL   RDW 60.1 (H) 09.3 - 23.5 %   Platelets 152 150 - 400 K/uL   Neutrophils Relative % 73 %   Neutro Abs 9.6 (H) 1.7 - 7.7 K/uL   Lymphocytes Relative 15 %   Lymphs Abs 2.0 0.7 - 4.0 K/uL   Monocytes Relative 12 %   Monocytes Absolute 1.5 (H) 0.1 - 1.0 K/uL   Eosinophils Relative 0 %   Eosinophils Absolute 0.0 0.0 - 0.7 K/uL   Basophils Relative 0 %   Basophils Absolute 0.0 0.0 - 0.1 K/uL  Urinalysis, Routine w reflex microscopic  Result Value Ref Range   Color, Urine YELLOW YELLOW   APPearance CLEAR CLEAR   Specific Gravity, Urine 1.010 1.005 - 1.030   pH 6.5 5.0 - 8.0   Glucose, UA NEGATIVE NEGATIVE mg/dL   Hgb urine dipstick TRACE (A) NEGATIVE   Bilirubin Urine NEGATIVE NEGATIVE   Ketones, ur NEGATIVE NEGATIVE mg/dL   Protein, ur NEGATIVE NEGATIVE mg/dL   Nitrite NEGATIVE NEGATIVE   Leukocytes, UA NEGATIVE NEGATIVE  Magnesium  Result Value Ref Range   Magnesium 1.8 1.7 - 2.4 mg/dL  Valproic acid level  Result Value Ref Range   Valproic Acid Lvl 54 50.0 - 100.0 ug/mL  Digoxin level  Result Value Ref Range   Digoxin Level 1.6 0.8 - 2.0 ng/mL  Basic metabolic panel  Result Value Ref Range     Sodium 137 135 - 145 mmol/L   Potassium 3.8 3.5 - 5.1 mmol/L   Chloride 93 (L) 101 - 111 mmol/L   CO2 32 22 - 32 mmol/L   Glucose, Bld 149 (H) 65 - 99 mg/dL   BUN 31 (H) 6 - 20 mg/dL   Creatinine, Ser 5.73 (H) 0.61 - 1.24 mg/dL   Calcium 9.2 8.9 - 22.0 mg/dL   GFR calc non Af Amer 48 (L) >60 mL/min   GFR calc Af Amer 56 (L) >60 mL/min   Anion gap 12 5 - 15  Troponin I  Result Value Ref Range   Troponin I 0.06 (HH) <0.03 ng/mL  Urine microscopic-add on  Result Value Ref Range   Squamous Epithelial / LPF 0-5 (A) NONE SEEN   WBC, UA 0-5 0 - 5 WBC/hpf   RBC / HPF 0-5 0 - 5 RBC/hpf   Bacteria, UA RARE (A) NONE SEEN   Laboratory interpretation all normal except hyperkalemia, elevated troponin, increased elevation of BNP, renal insufficiency, repeat BMET  now has normal potassium, delta troponin unchanged so is a reflection of his underlying CHF.     EKG  EKG Interpretation  Date/Time:  Friday December 22 2015 23:29:56 EDT Ventricular Rate:  93 PR Interval:    QRS Duration: 133 QT Interval:  362 QTC Calculation: 451 R Axis:   60 Text Interpretation:  Atrial fibrillation Nonspecific intraventricular conduction delay Nonspecific repol abnormality, lateral leads since last tracing no significant change ST abnormality in the anterior leads persists Confirmed by MILLER  MD, BRIAN ([id number]) on 12/22/2015 11:32:30 PM       Radiology Dg Chest Port 1 View  Result Date: 12/23/2015 CLINICAL DATA:  Shortness of breath with lower extremity swelling for 2 days. EXAM: PORTABLE CHEST 1 VIEW COMPARISON:  11/13/2014 FINDINGS: Single lead pacemaker remains in place, tip projecting over the right ventricle. Cardiomegaly is stable. There is atherosclerosis of the thoracic aorta. Mild vascular congestion. Question of blunting of costophrenic angles, limited assessment for pleural effusion on portable AP exam. No focal airspace disease or pleural effusion. Chronic change of both shoulders. IMPRESSION:  Cardiomegaly with mild vascular congestion. Questionable blunting of costophrenic angles, may be small effusions. PA and lateral views may be helpful when patient is able. Thoracic aortic atherosclerosis. Electronically Signed   By: Rubye Oaks M.D.   On: 12/23/2015 00:01 November 2014 Transthoracic Echocardiography  Patient:    Nykolas, Bacallao MR #:       [id number] Study Date: 10/17/2014   ------------------------------------------------------------------- LV EF: 20% -   25%  ------------------------------------------------------------------- Indications:      Syncope 780.2.  ------------------------------------------------------------------- History:   PMH:   Atrial fibrillation.  Congestive heart failure. Chronic obstructive pulmonary disease.  PMH:   Myocardial infarction.  Risk factors:  Diabetes mellitus.  ------------------------------------------------------------------- Study Conclusions  - Left ventricle: The cavity size was normal. Wall thickness was   increased in a pattern of mild LVH. Systolic function was   severely reduced. The estimated ejection fraction was in the   range of 20% to 25%. Diffuse hypokinesis. There is akinesis of   the anteroseptal and apical myocardium. - Aortic valve: There was trivial regurgitation. - Mitral valve: Calcified annulus. There was mild regurgitation. - Left atrium: The atrium was mildly dilated. - Pulmonary arteries: Systolic pressure was mildly increased. PA   peak pressure: 33 mm Hg (S).  Impressions:  - Global hypokinesis with anteroseptal and apical akinesis; overall   severely reduced LV function; definity used with no apical   thrombus noted; trace AI; mild MR; mild LAE; mild TR; mildly   elevated pulmonary pressure.  Procedures Procedures (including critical care time)  Medications Ordered in ED Medications  nitroGLYCERIN (NITROGLYN) 2 % ointment 1 inch (1 inch Topical Given 12/23/15 0008)  furosemide  (LASIX) injection 60 mg (60 mg Intravenous Given 12/23/15 0005)     Initial Impression / Assessment and Plan / ED Course  I have reviewed the triage vital signs and the nursing notes.  Pertinent labs & imaging results that were available during my care of the patient were reviewed by me and considered in my medical decision making (see chart for details).  Clinical Course  DIAGNOSTIC STUDIES: Oxygen Saturation is 99% on RA, normal by my interpretation.  COORDINATION OF CARE: 11:38 PM-Will order blood work. Discussed treatment plan with pt at bedside and pt agreed to plan. Pt given IV lasix and given NTG paste for presumed exacerbation of CHF.   01:13 AM spoke to lab, states blood  moderately hemolyzed. Will repeat.   01:45 AM  Spoke to patient about his test results. He states he was unaware that the swelling in his legs was from congestive heart failure. He states that had never been explained a.m. before. We discussed his elevated potassium and that may be a lab error, we also discussed getting the delta troponin. His repeat blood work is scheduled for 2 AM.  03:30 AM rechecked, feeling better, is having urine output.  03:50 AM nurses ambulated patient and his pulse ox was 95% on RA.   4:20 AM patient states he felt fine while ambulating. At this point I feel patient can be discharged home. He was advised to take double of his Lasix for the next 3 days or so until the swelling improves. He should call his cardiologist's office on Monday, September 18 to see if they want to recheck him in the office this week. He was advised to return if his symptoms get worse.  Final Clinical Impressions(s) / ED Diagnoses   Final diagnoses:  Acute on chronic congestive heart failure, unspecified congestive heart failure type (HCC)    New Prescriptions Increase lasix to 3 tabs daily instead of 1 1/2 tabs.  Plan discharge  Devoria AlbeIva Shivangi Lutz, MD, FACEP   I personally performed the services described in  this documentation, which was scribed in my presence. The recorded information has been reviewed and considered.  Devoria AlbeIva Bartley Vuolo, MD, Concha PyoFACEP      Slayton Lubitz, MD 12/23/15 [id number]

## 2015-12-23 LAB — URINE MICROSCOPIC-ADD ON

## 2015-12-23 LAB — CBC WITH DIFFERENTIAL/PLATELET
BASOS PCT: 0 %
Basophils Absolute: 0 10*3/uL (ref 0.0–0.1)
EOS ABS: 0 10*3/uL (ref 0.0–0.7)
EOS PCT: 0 %
HCT: 48.3 % (ref 39.0–52.0)
Hemoglobin: 15.8 g/dL (ref 13.0–17.0)
Lymphocytes Relative: 15 %
Lymphs Abs: 2 10*3/uL (ref 0.7–4.0)
MCH: 33.6 pg (ref 26.0–34.0)
MCHC: 32.7 g/dL (ref 30.0–36.0)
MCV: 102.8 fL — ABNORMAL HIGH (ref 78.0–100.0)
MONO ABS: 1.5 10*3/uL — AB (ref 0.1–1.0)
MONOS PCT: 12 %
Neutro Abs: 9.6 10*3/uL — ABNORMAL HIGH (ref 1.7–7.7)
Neutrophils Relative %: 73 %
PLATELETS: 152 10*3/uL (ref 150–400)
RBC: 4.7 MIL/uL (ref 4.22–5.81)
RDW: 15.7 % — AB (ref 11.5–15.5)
WBC: 13.1 10*3/uL — ABNORMAL HIGH (ref 4.0–10.5)

## 2015-12-23 LAB — COMPREHENSIVE METABOLIC PANEL
ALT: 21 U/L (ref 17–63)
AST: 55 U/L — AB (ref 15–41)
Albumin: 4.1 g/dL (ref 3.5–5.0)
Alkaline Phosphatase: 49 U/L (ref 38–126)
Anion gap: 14 (ref 5–15)
BILIRUBIN TOTAL: 2.2 mg/dL — AB (ref 0.3–1.2)
BUN: 31 mg/dL — AB (ref 6–20)
CO2: 26 mmol/L (ref 22–32)
CREATININE: 1.46 mg/dL — AB (ref 0.61–1.24)
Calcium: 9.1 mg/dL (ref 8.9–10.3)
Chloride: 94 mmol/L — ABNORMAL LOW (ref 101–111)
GFR, EST AFRICAN AMERICAN: 51 mL/min — AB (ref >60)
GFR, EST NON AFRICAN AMERICAN: 44 mL/min — AB (ref >60)
Glucose, Bld: 157 mg/dL — ABNORMAL HIGH (ref 65–99)
Potassium: 6 mmol/L — ABNORMAL HIGH (ref 3.5–5.1)
Sodium: 134 mmol/L — ABNORMAL LOW (ref 135–145)
TOTAL PROTEIN: 6.5 g/dL (ref 6.5–8.1)

## 2015-12-23 LAB — BASIC METABOLIC PANEL
ANION GAP: 12 (ref 5–15)
BUN: 31 mg/dL — ABNORMAL HIGH (ref 6–20)
CALCIUM: 9.2 mg/dL (ref 8.9–10.3)
CO2: 32 mmol/L (ref 22–32)
Chloride: 93 mmol/L — ABNORMAL LOW (ref 101–111)
Creatinine, Ser: 1.35 mg/dL — ABNORMAL HIGH (ref 0.61–1.24)
GFR calc Af Amer: 56 mL/min — ABNORMAL LOW (ref >60)
GFR calc non Af Amer: 48 mL/min — ABNORMAL LOW (ref >60)
GLUCOSE: 149 mg/dL — AB (ref 65–99)
Potassium: 3.8 mmol/L (ref 3.5–5.1)
Sodium: 137 mmol/L (ref 135–145)

## 2015-12-23 LAB — URINALYSIS, ROUTINE W REFLEX MICROSCOPIC
BILIRUBIN URINE: NEGATIVE
Glucose, UA: NEGATIVE mg/dL
KETONES UR: NEGATIVE mg/dL
Leukocytes, UA: NEGATIVE
NITRITE: NEGATIVE
PH: 6.5 (ref 5.0–8.0)
Protein, ur: NEGATIVE mg/dL
Specific Gravity, Urine: 1.01 (ref 1.005–1.030)

## 2015-12-23 LAB — VALPROIC ACID LEVEL: VALPROIC ACID LVL: 54 ug/mL (ref 50.0–100.0)

## 2015-12-23 LAB — TROPONIN I
TROPONIN I: 0.06 ng/mL — AB (ref <0.03)
Troponin I: 0.06 ng/mL (ref <0.03)

## 2015-12-23 LAB — MAGNESIUM: MAGNESIUM: 1.8 mg/dL (ref 1.7–2.4)

## 2015-12-23 LAB — BRAIN NATRIURETIC PEPTIDE: B Natriuretic Peptide: 1317 pg/mL — ABNORMAL HIGH (ref 0.0–100.0)

## 2015-12-23 LAB — DIGOXIN LEVEL: Digoxin Level: 1.6 ng/mL (ref 0.8–2.0)

## 2015-12-23 NOTE — ED Notes (Signed)
CRITICAL VALUE ALERT  Critical value received:  Troponin 0.06  Date of notification:  12/23/15  Time of notification:  0103 hrs  Critical value read back:Yes.    Nurse who received alert:  Y. Kirsta Probert, RN  Responding MD:  Dr. Lynelle Doctor  Time MD responded:  0104 hrs

## 2015-12-23 NOTE — Discharge Instructions (Signed)
For the next 3 days, take double your fluid pills (take 3 at a time), when your swelling improves go back to the 1 1/2 pills a day. Call Dr Landry Dyke office on Monday, September 18th to let them know about your ED visit. He may want you to be rechecked in the office.  Return to the ED if you get worse, get more swelling, get chest pain, get more short of breath.  Look at the low sodium diet, eating salty foods or drinks will make your legs and abdomen swell more.

## 2015-12-23 NOTE — ED Notes (Signed)
Patient ambulated in hall without difficulty.

## 2015-12-28 ENCOUNTER — Telehealth: Payer: Self-pay | Admitting: Cardiovascular Disease

## 2015-12-28 NOTE — Telephone Encounter (Signed)
Left msg for patient to call. 

## 2015-12-28 NOTE — Telephone Encounter (Signed)
New message  Pt call requesting to speak with RN. Pt states he would like a soon appt that next available with [name]. pt only wants to see provider. Please call back to discuss

## 2015-12-29 NOTE — Telephone Encounter (Signed)
Unable to reach pt dtr or leave a message  

## 2015-12-29 NOTE — Telephone Encounter (Signed)
Per daughter's call  Fluid around heart and was seen at [facility] waiting on a call back for a sooner appointment.  ASAP.  In Oct.

## 2015-12-31 ENCOUNTER — Encounter (HOSPITAL_COMMUNITY): Payer: Self-pay

## 2015-12-31 ENCOUNTER — Emergency Department (HOSPITAL_COMMUNITY)
Admission: EM | Admit: 2015-12-31 | Discharge: 2015-12-31 | Disposition: A | Discharge status: Home / Self Care | Payer: Medicare HMO | Attending: Emergency Medicine | Admitting: Emergency Medicine

## 2015-12-31 DIAGNOSIS — Y999 Unspecified external cause status: Secondary | ICD-10-CM | POA: Insufficient documentation

## 2015-12-31 DIAGNOSIS — I110 Hypertensive heart disease with heart failure: Secondary | ICD-10-CM | POA: Insufficient documentation

## 2015-12-31 DIAGNOSIS — W010XXA Fall on same level from slipping, tripping and stumbling without subsequent striking against object, initial encounter: Secondary | ICD-10-CM

## 2015-12-31 DIAGNOSIS — Z7984 Long term (current) use of oral hypoglycemic drugs: Secondary | ICD-10-CM | POA: Diagnosis not present

## 2015-12-31 DIAGNOSIS — Y92481 Parking lot as the place of occurrence of the external cause: Secondary | ICD-10-CM | POA: Insufficient documentation

## 2015-12-31 DIAGNOSIS — Y939 Activity, unspecified: Secondary | ICD-10-CM | POA: Diagnosis not present

## 2015-12-31 DIAGNOSIS — Z87891 Personal history of nicotine dependence: Secondary | ICD-10-CM | POA: Diagnosis not present

## 2015-12-31 DIAGNOSIS — Z79899 Other long term (current) drug therapy: Secondary | ICD-10-CM | POA: Diagnosis not present

## 2015-12-31 DIAGNOSIS — E119 Type 2 diabetes mellitus without complications: Secondary | ICD-10-CM | POA: Insufficient documentation

## 2015-12-31 DIAGNOSIS — J449 Chronic obstructive pulmonary disease, unspecified: Secondary | ICD-10-CM | POA: Diagnosis not present

## 2015-12-31 DIAGNOSIS — Z7982 Long term (current) use of aspirin: Secondary | ICD-10-CM | POA: Diagnosis not present

## 2015-12-31 DIAGNOSIS — S80211A Abrasion, right knee, initial encounter: Secondary | ICD-10-CM | POA: Diagnosis not present

## 2015-12-31 DIAGNOSIS — I5022 Chronic systolic (congestive) heart failure: Secondary | ICD-10-CM | POA: Diagnosis not present

## 2015-12-31 DIAGNOSIS — I2510 Atherosclerotic heart disease of native coronary artery without angina pectoris: Secondary | ICD-10-CM | POA: Insufficient documentation

## 2015-12-31 DIAGNOSIS — S0993XA Unspecified injury of face, initial encounter: Secondary | ICD-10-CM | POA: Diagnosis present

## 2015-12-31 DIAGNOSIS — W19XXXA Unspecified fall, initial encounter: Secondary | ICD-10-CM | POA: Insufficient documentation

## 2015-12-31 DIAGNOSIS — Z23 Encounter for immunization: Secondary | ICD-10-CM | POA: Insufficient documentation

## 2015-12-31 DIAGNOSIS — S80212A Abrasion, left knee, initial encounter: Secondary | ICD-10-CM | POA: Diagnosis not present

## 2015-12-31 DIAGNOSIS — S01511A Laceration without foreign body of lip, initial encounter: Secondary | ICD-10-CM

## 2015-12-31 MED ORDER — DIPHENHYDRAMINE HCL 50 MG/ML IJ SOLN
INTRAMUSCULAR | Status: AC
  Administered 2015-12-31: 25 mg via INTRAMUSCULAR
  Filled 2015-12-31: qty 1

## 2015-12-31 MED ORDER — TETANUS-DIPHTH-ACELL PERTUSSIS 5-2.5-18.5 LF-MCG/0.5 IM SUSP
0.5000 mL | Freq: Once | INTRAMUSCULAR | Status: AC
  Administered 2015-12-31: 0.5 mL via INTRAMUSCULAR
  Filled 2015-12-31: qty 0.5

## 2015-12-31 MED ORDER — AMOXICILLIN 500 MG PO CAPS: 500.0000 mg | ORAL_CAPSULE | Freq: Three times a day (TID) | ORAL | 0 refills | Status: DC

## 2015-12-31 MED ORDER — AMOXICILLIN 250 MG PO CAPS
500.0000 mg | ORAL_CAPSULE | Freq: Once | ORAL | Status: AC
  Administered 2015-12-31: 500 mg via ORAL
  Filled 2015-12-31: qty 2

## 2015-12-31 MED ORDER — DIPHENHYDRAMINE HCL 50 MG/ML IJ SOLN
25.0000 mg | Freq: Once | INTRAMUSCULAR | Status: AC
  Administered 2015-12-31: 25 mg via INTRAMUSCULAR

## 2015-12-31 NOTE — ED Provider Notes (Signed)
[facility]-EMER[facility]Y DEPT Provider Note   CSN: [id number] Arrival date & time: 12/31/15  0007  By signing my name below, I, Modena Jansky, attest that this documentation has been prepared under the direction and in the presence of Dione Booze, MD . Electronically Signed: Modena Jansky, Scribe. 12/31/2015. 12:35 AM.  History   Chief Complaint Chief Complaint  Patient presents with  . Fall   The history is provided by the patient. No language interpreter was used.   HPI Comments: [name] is a 81 y.o. male who presents to the Emergency Department complaining of a fall that occurred today. Pt states he slipped and hit both of his knees and mouth. Denies any head injury or LOC. Reports wounds to his BLE and lower lip. Unsure of tetanus status. Reports he has an allergy to Novocain. Denies any other complaints.   Past Medical History:  Diagnosis Date  . Arteriosclerotic cardiovascular disease (ASCVD)   . At risk for sudden cardiac death 2012/09/10  . Atrial fibrillation (HCC)    Onset in 2012  . Atrial flutter (HCC)   . Bleeding in brain due to brain aneurysm [facility]) October 06 2014  . CHF (congestive heart failure) (HCC)   . Chronic anticoagulation 2012   2012  . COPD (chronic obstructive pulmonary disease) (HCC)   . DJD (degenerative joint disease)   . Gout   . Hyperlipidemia   . ICD (implantable cardiac defibrillator) in place   . Ischemic cardiomyopathy   . Kidney stone    "just once" (08/17/2012)  . Myocardial infarction (HCC) 1998  . NICM (nonischemic cardiomyopathy), EF 25-30% 09/10/12  . Obstructive sleep apnea    "went away when I lost a bunch of weight" (08/17/2012)  . Permanent atrial fibrillation (HCC) 02/15/2011   Initial onset in 03/2011 with rapid ventricular response   . S/P ICD (internal cardiac defibrillator) procedure, 08/17/12, [employer] 2012/09/10   [employer]  . Type II diabetes mellitus [facility])     Patient Active Problem List   Diagnosis Date Noted    . Acute on chronic combined systolic and diastolic CHF (congestive heart failure) (HCC) 02/17/2015  . Chest pain 10/21/2014  . Transient alteration of awareness   . Systolic CHF, chronic (HCC)   . Subarachnoid hemorrhage (HCC)   . Cerebral thrombosis with cerebral infarction (HCC) 10/17/2014  . Seizure-like activity (HCC) 10/16/2014  . Syncope   . Subdural hematoma (HCC)   . Syncope and collapse   . Chronic systolic heart failure (HCC)   . Elevated troponin   . Other emphysema (HCC)   . Diabetes type 2, uncontrolled (HCC)   . HLD (hyperlipidemia)   . ACS (acute coronary syndrome) (HCC) 04/05/2014  . Sepsis (HCC) 04/05/2014  . Coronary artery disease 01/05/2014  . Cardiomyopathy, ischemic 03/26/2013  . Obstructive sleep apnea 03/26/2013  . Chronic combined systolic and diastolic CHF, NYHA class 2 (HCC) 09/24/2012  . At risk for sudden cardiac death 09-10-2012  . S/P ICD (internal cardiac defibrillator) procedure, 08/17/12, [employer] implanted 2012/09/10  . Fall 02/28/2012  . Traumatic subarachnoid hemorrhage (HCC) 02/28/2012  . Multiple fractures of ribs of left side 02/28/2012  . Hyperkalemia 06/25/2011  . Ureterolithiasis 06/25/2011  . Laboratory test 04/29/2011  . COPD (chronic obstructive pulmonary disease) (HCC)   . Chronic anticoagulation, coumadin   . Permanent atrial fibrillation (HCC) 02/15/2011  . DIABETES MELLITUS, TYPE II 10/07/2008  . OBSTRUCTIVE SLEEP APNEA 10/07/2008  . Arteriosclerotic cardiovascular disease (ASCVD) 10/07/2008    Past  Surgical History:  Procedure Laterality Date  . CARDIAC CATHETERIZATION  02/2003  . CARDIAC DEFIBRILLATOR PLACEMENT  08/17/2012   [employer]  . CATARACT EXTRACTION W/ INTRAOCULAR LENS  IMPLANT, BILATERAL Bilateral ~ 2011  . CHOLECYSTECTOMY  2009  . CORONARY ANGIOPLASTY WITH STENT PLACEMENT  07/14/1996   "1" (08/17/2012)  . CYSTOSCOPY/RETROGRADE/URETEROSCOPY  06/28/2011   Procedure: CYSTOSCOPY/RETROGRADE/URETEROSCOPY;   Surgeon: Ky BarbanMohammad I Javaid, MD;  Location: [facility] ORS;  Service: Urology;  Laterality: Right;  . IMPLANTABLE CARDIOVERTER DEFIBRILLATOR IMPLANT N/A 08/17/2012   Procedure: IMPLANTABLE CARDIOVERTER DEFIBRILLATOR IMPLANT;  Surgeon: Thurmon FairMihai Croitoru, MD;  Location: [facility] CATH [facility];  Service: Cardiovascular;  Laterality: N/A;  . KNEE ARTHROPLASTY Left 1978   "tendon & cartilege repair" (08/17/2012)  . Myoview perfusion scan  06/02/2012   low risk, extensive scar entire LAD & RCA territory  . s/p icd  08/2012   [employer]  . STONE EXTRACTION WITH BASKET  06/28/2011   Procedure: STONE EXTRACTION WITH BASKET;  Surgeon: Ky BarbanMohammad I Javaid, MD;  Location: [facility] ORS;  Service: Urology;  Laterality: Right;  specimen given to family per MD  . US ECHOCARDIOGRAPHY  07/08/2012   EF <20%,mild MR,TR,LA severely dilated  . VASECTOMY  ~ 1964       Home Medications    Prior to Admission medications   Medication Sig Start Date End Date Taking? Authorizing Provider  aspirin EC 81 MG tablet Take 81 mg by mouth daily.    Historical Provider, MD  digoxin (LANOXIN) 0.25 MG tablet Take 1 tablet (0.25 mg total) by mouth daily. 10/23/14 09/20/17  Ripudeep Jenna LuoK Rai, MD  divalproex (DEPAKOTE ER) 500 MG 24 hr tablet Take 2 tablets (1,000 mg total) by mouth at bedtime. 11/28/15   Levert FeinsteinYijun Yan, MD  furosemide (LASIX) 40 MG tablet TAKE 1 AND 1/2 TABLETS ONCE DAILY 02/15/15   Mihai Croitoru, MD  magnesium oxide (MAG-OX) 400 MG tablet Take 1 tablet (400 mg total) by mouth 2 (two) times daily. 10/23/14   Ripudeep Jenna LuoK Rai, MD  metFORMIN (GLUCOPHAGE) 1000 MG tablet Take 1 tablet (1,000 mg total) by mouth 2 (two) times daily with a meal. 10/23/14   Ripudeep K Rai, MD  potassium chloride (K-DUR) 10 MEQ tablet Take 1 tablet (10 mEq total) by mouth daily. 02/15/15   Mihai Croitoru, MD  pravastatin (PRAVACHOL) 40 MG tablet Take 1 tablet (40 mg total) by mouth daily. 10/23/14   Ripudeep Jenna LuoK Rai, MD    Family History Family History  Problem Relation Age of  Onset  . Heart failure Mother   . Heart failure Father     Social History Social History  Substance Use Topics  . Smoking status: Former Smoker    Packs/day: 2.00    Years: 40.00    Types: Cigarettes    Quit date: 04/08/1992  . Smokeless tobacco: Former NeurosurgeonUser  . Alcohol use No     Comment: 08/17/2012 "quit drinking in 1983"     Allergies   Lipitor [atorvastatin]; Percocet [oxycodone-acetaminophen]; Procaine hcl; Tramadol; and Vicodin [hydrocodone-acetaminophen]   Review of Systems Review of Systems  Skin: Positive for wound.  Neurological: Negative for syncope.  All other systems reviewed and are negative.    Physical Exam Updated Vital Signs BP 99/66 (BP Location: Right Arm)   Pulse 74   Temp 97.9 F (36.6 C) (Oral)   Resp 16   Wt 159 lb (72.1 kg)   SpO2 97%   BMI 26.87 kg/m   Physical Exam  Constitutional: He is oriented to  person, place, and time. He appears well-developed and well-nourished.  HENT:  Head: Normocephalic.  Four separate lacerations of the lower mucousal surface. No dental or gingival injury.   Eyes: EOM are normal. Pupils are equal, round, and reactive to light.  Neck: Normal range of motion. Neck supple. No JVD present.  Cardiovascular: Normal rate, regular rhythm and normal heart sounds.   No murmur heard. Pulmonary/Chest: Effort normal and breath sounds normal. He has no wheezes. He has no rales. He exhibits no tenderness.  Abdominal: Soft. Bowel sounds are normal. He exhibits no distension and no mass. There is no tenderness.  Musculoskeletal: Normal range of motion.  +2 pre tibial edema. Abrasion of the anterior surface of both knees.   Lymphadenopathy:    He has no cervical adenopathy.  Neurological: He is alert and oriented to person, place, and time. No cranial nerve deficit. He exhibits normal muscle tone. Coordination normal.  Skin: Skin is warm and dry. No rash noted.  Psychiatric: He has a normal mood and affect. His behavior is  normal. Judgment and thought content normal.  Nursing note and vitals reviewed.    ED Treatments / Results  DIAGNOSTIC STUDIES: Oxygen Saturation is 97% on RA, normal by my interpretation.    COORDINATION OF CARE: 12:40 AM- Pt advised of plan for treatment and pt agrees.  Labs (all labs ordered are listed, but only abnormal results are displayed) Labs Reviewed - No data to display  EKG  EKG Interpretation None       Radiology No results found.  Procedures Procedures (including critical care time)  Medications Ordered in ED Medications  diphenhydrAMINE (BENADRYL) 50 MG/ML injection (not administered)  amoxicillin (AMOXIL) capsule 500 mg (not administered)  Tdap (BOOSTRIX) injection 0.5 mL (0.5 mLs Intramuscular Given 12/31/15 0048)     Initial Impression / Assessment and Plan / ED Course  I have reviewed the triage vital signs and the nursing notes.  Pertinent labs & imaging results that were available during my care of the patient were reviewed by me and considered in my medical decision making (see chart for details).  Clinical Course   Fall with Plex lacerations of the lower lip, mucosal surface. Tdap booster is given. Lacerations were closed by Burgess Amor PA-C. He is discharged with prescription for amoxicillin for 3 days for prophylaxis. Minor abrasions were noted on his knees with no evidence of deeper injury to require x-rays.  Final Clinical Impressions(s) / ED Diagnoses   Final diagnoses:  Fall from slip, trip, or stumble, initial encounter  Laceration of lower lip, complicated, initial encounter  Abrasion, knee, right, initial encounter  Abrasion, knee, left, initial encounter    New Prescriptions New Prescriptions   AMOXICILLIN (AMOXIL) 500 MG CAPSULE    Take 1 capsule (500 mg total) by mouth 3 (three) times daily.   I personally performed the services described in this documentation, which was scribed in my presence. The recorded information has  been reviewed and is accurate.       Dione Booze, MD 12/31/15 [phone]

## 2015-12-31 NOTE — ED Triage Notes (Signed)
Patient states that he fell in the parking lot at [place] and busted his lip open.  States that he skinned both his knees also.

## 2015-12-31 NOTE — ED Provider Notes (Signed)
Pt of Dr. Preston Fleeting,  I was asked to repair his lip laceration.  This was the extent of my care of this patient.  LACERATION REPAIR Performed by: Burgess Amor Authorized by: Burgess Amor Consent: Verbal consent obtained. Risks and benefits: risks, benefits and alternatives were discussed Consent given by: patient Patient identity confirmed: provided demographic data Prepped and Draped in normal sterile fashion Wound explored  Laceration Location: lower lip and lower lip mucosa, not crossing the vermillion border  Laceration Length: 2cm  No Foreign Bodies seen or palpated  Anesthesia: local infiltration  Local anesthetic: benadryl 25 mg and NS in 1:5 concentration  Anesthetic total: 2 ml  Irrigation method: syringe Amount of cleaning: standard  Skin closure: vicryl rapide 5-0  Number of sutures: 8  Technique: simple interrupted.  Patient tolerance: Patient tolerated the procedure well with no immediate complications.    Burgess Amor, PA-C 12/31/15 0135    Dione Booze, MD 12/31/15 [phone]

## 2016-01-02 ENCOUNTER — Emergency Department (HOSPITAL_COMMUNITY)
Admission: EM | Admit: 2016-01-02 | Discharge: 2016-01-02 | Disposition: A | Discharge status: Home / Self Care | Payer: Medicare HMO | Attending: Emergency Medicine | Admitting: Emergency Medicine

## 2016-01-02 ENCOUNTER — Emergency Department (HOSPITAL_COMMUNITY): Payer: Medicare HMO

## 2016-01-02 ENCOUNTER — Encounter (HOSPITAL_COMMUNITY): Payer: Self-pay | Admitting: Emergency Medicine

## 2016-01-02 DIAGNOSIS — Z7984 Long term (current) use of oral hypoglycemic drugs: Secondary | ICD-10-CM | POA: Insufficient documentation

## 2016-01-02 DIAGNOSIS — L03115 Cellulitis of right lower limb: Secondary | ICD-10-CM | POA: Diagnosis not present

## 2016-01-02 DIAGNOSIS — I110 Hypertensive heart disease with heart failure: Secondary | ICD-10-CM | POA: Diagnosis not present

## 2016-01-02 DIAGNOSIS — J449 Chronic obstructive pulmonary disease, unspecified: Secondary | ICD-10-CM | POA: Insufficient documentation

## 2016-01-02 DIAGNOSIS — I5043 Acute on chronic combined systolic (congestive) and diastolic (congestive) heart failure: Secondary | ICD-10-CM | POA: Diagnosis not present

## 2016-01-02 DIAGNOSIS — Z87891 Personal history of nicotine dependence: Secondary | ICD-10-CM | POA: Insufficient documentation

## 2016-01-02 DIAGNOSIS — Z7982 Long term (current) use of aspirin: Secondary | ICD-10-CM | POA: Insufficient documentation

## 2016-01-02 DIAGNOSIS — E119 Type 2 diabetes mellitus without complications: Secondary | ICD-10-CM | POA: Insufficient documentation

## 2016-01-02 DIAGNOSIS — R319 Hematuria, unspecified: Secondary | ICD-10-CM | POA: Insufficient documentation

## 2016-01-02 DIAGNOSIS — M25561 Pain in right knee: Secondary | ICD-10-CM | POA: Diagnosis present

## 2016-01-02 DIAGNOSIS — I2510 Atherosclerotic heart disease of native coronary artery without angina pectoris: Secondary | ICD-10-CM | POA: Insufficient documentation

## 2016-01-02 LAB — URINALYSIS, ROUTINE W REFLEX MICROSCOPIC
Glucose, UA: NEGATIVE mg/dL
Leukocytes, UA: NEGATIVE
NITRITE: NEGATIVE
Protein, ur: 30 mg/dL — AB
SPECIFIC GRAVITY, URINE: 1.025 (ref 1.005–1.030)
pH: 6 (ref 5.0–8.0)

## 2016-01-02 LAB — URINE MICROSCOPIC-ADD ON

## 2016-01-02 MED ORDER — FENTANYL CITRATE (PF) 100 MCG/2ML IJ SOLN
50.0000 ug | Freq: Once | INTRAMUSCULAR | Status: AC
  Administered 2016-01-02: 50 ug via INTRAMUSCULAR
  Filled 2016-01-02: qty 2

## 2016-01-02 MED ORDER — ONDANSETRON 4 MG PO TBDP: 4.0000 mg | ORAL_TABLET | Freq: Three times a day (TID) | ORAL | 0 refills | Status: DC | PRN

## 2016-01-02 MED ORDER — OXYCODONE HCL 5 MG PO TABS: 5.0000 mg | ORAL_TABLET | ORAL | 0 refills | Status: DC | PRN

## 2016-01-02 MED ORDER — CEPHALEXIN 500 MG PO CAPS: 500.0000 mg | ORAL_CAPSULE | Freq: Four times a day (QID) | ORAL | 0 refills | Status: DC

## 2016-01-02 MED ORDER — SULFAMETHOXAZOLE-TRIMETHOPRIM 800-160 MG PO TABS: 1.0000 | ORAL_TABLET | Freq: Two times a day (BID) | ORAL | 0 refills | Status: AC

## 2016-01-02 MED ORDER — BACITRACIN ZINC 500 UNIT/GM EX OINT
TOPICAL_OINTMENT | CUTANEOUS | Status: AC
  Administered 2016-01-02: 18:00:00
  Filled 2016-01-02: qty 0.9

## 2016-01-02 MED ORDER — CEPHALEXIN 500 MG PO CAPS
500.0000 mg | ORAL_CAPSULE | Freq: Once | ORAL | Status: AC
  Administered 2016-01-02: 500 mg via ORAL
  Filled 2016-01-02: qty 1

## 2016-01-02 NOTE — ED Provider Notes (Signed)
[facility] Provider Note   CSN: [id number] Arrival date & time: 01/02/16  1406     History   Chief Complaint Chief Complaint  Patient presents with  . Knee Pain    HPI [name] is a 81 y.o. male.  Pt presents to the ED today with right knee pain.  He said that he fell on 9/26 and was seen here.  He did not have his knee x-rayed then.  The pt said that his knee is hurting and it is hard for him to walk.  The pt also said that his urine did not look right this morning.      Past Medical History:  Diagnosis Date  . Arteriosclerotic cardiovascular disease (ASCVD)   . At risk for sudden cardiac death 2012/08/23  . Atrial fibrillation (HCC)    Onset in 2012  . Atrial flutter (HCC)   . Bleeding in brain due to brain aneurysm [facility]) October 06 2014  . CHF (congestive heart failure) (HCC)   . Chronic anticoagulation 2012   2012  . COPD (chronic obstructive pulmonary disease) (HCC)   . DJD (degenerative joint disease)   . Gout   . Hyperlipidemia   . ICD (implantable cardiac defibrillator) in place   . Ischemic cardiomyopathy   . Kidney stone    "just once" (08/17/2012)  . Myocardial infarction (HCC) 1998  . NICM (nonischemic cardiomyopathy), EF 25-30% 2012/08/23  . Obstructive sleep apnea    "went away when I lost a bunch of weight" (08/17/2012)  . Permanent atrial fibrillation (HCC) 02/15/2011   Initial onset in 03/2011 with rapid ventricular response   . S/P ICD (internal cardiac defibrillator) procedure, 08/17/12, [employer] Aug 23, 2012   [employer]  . Type II diabetes mellitus [facility])     Patient Active Problem List   Diagnosis Date Noted  . Acute on chronic combined systolic and diastolic CHF (congestive heart failure) (HCC) 02/17/2015  . Chest pain 10/21/2014  . Transient alteration of awareness   . Systolic CHF, chronic (HCC)   . Subarachnoid hemorrhage (HCC)   . Cerebral thrombosis with cerebral infarction (HCC) 10/17/2014  . Seizure-like  activity (HCC) 10/16/2014  . Syncope   . Subdural hematoma (HCC)   . Syncope and collapse   . Chronic systolic heart failure (HCC)   . Elevated troponin   . Other emphysema (HCC)   . Diabetes type 2, uncontrolled (HCC)   . HLD (hyperlipidemia)   . ACS (acute coronary syndrome) (HCC) 04/05/2014  . Sepsis (HCC) 04/05/2014  . Coronary artery disease 01/05/2014  . Cardiomyopathy, ischemic 03/26/2013  . Obstructive sleep apnea 03/26/2013  . Chronic combined systolic and diastolic CHF, NYHA class 2 (HCC) 09/24/2012  . At risk for sudden cardiac death 08/23/12  . S/P ICD (internal cardiac defibrillator) procedure, 08/17/12, [employer] implanted 2012-08-23  . Fall 02/28/2012  . Traumatic subarachnoid hemorrhage (HCC) 02/28/2012  . Multiple fractures of ribs of left side 02/28/2012  . Hyperkalemia 06/25/2011  . Ureterolithiasis 06/25/2011  . Laboratory test 04/29/2011  . COPD (chronic obstructive pulmonary disease) (HCC)   . Chronic anticoagulation, coumadin   . Permanent atrial fibrillation (HCC) 02/15/2011  . DIABETES MELLITUS, TYPE II 10/07/2008  . OBSTRUCTIVE SLEEP APNEA 10/07/2008  . Arteriosclerotic cardiovascular disease (ASCVD) 10/07/2008    Past Surgical History:  Procedure Laterality Date  . CARDIAC CATHETERIZATION  02/2003  . CARDIAC DEFIBRILLATOR PLACEMENT  08/17/2012   [employer]  . CATARACT EXTRACTION W/ INTRAOCULAR LENS  IMPLANT, BILATERAL Bilateral ~ 2011  .  CHOLECYSTECTOMY  2009  . CORONARY ANGIOPLASTY WITH STENT PLACEMENT  07/14/1996   "1" (08/17/2012)  . CYSTOSCOPY/RETROGRADE/URETEROSCOPY  06/28/2011   Procedure: CYSTOSCOPY/RETROGRADE/URETEROSCOPY;  Surgeon: Ky BarbanMohammad I Javaid, MD;  Location: [facility] ORS;  Service: Urology;  Laterality: Right;  . IMPLANTABLE CARDIOVERTER DEFIBRILLATOR IMPLANT N/A 08/17/2012   Procedure: IMPLANTABLE CARDIOVERTER DEFIBRILLATOR IMPLANT;  Surgeon: Thurmon FairMihai Croitoru, MD;  Location: [facility] CATH LAB;  Service: Cardiovascular;  Laterality: N/A;  .  KNEE ARTHROPLASTY Left 1978   "tendon & cartilege repair" (08/17/2012)  . Myoview perfusion scan  06/02/2012   low risk, extensive scar entire LAD & RCA territory  . s/p icd  08/2012   [employer]  . STONE EXTRACTION WITH BASKET  06/28/2011   Procedure: STONE EXTRACTION WITH BASKET;  Surgeon: Ky BarbanMohammad I Javaid, MD;  Location: [facility] ORS;  Service: Urology;  Laterality: Right;  specimen given to family per MD  . US ECHOCARDIOGRAPHY  07/08/2012   EF <20%,mild MR,TR,LA severely dilated  . VASECTOMY  ~ 1964       Home Medications    Prior to Admission medications   Medication Sig Start Date End Date Taking? Authorizing Provider  amoxicillin (AMOXIL) 500 MG capsule Take 1 capsule (500 mg total) by mouth 3 (three) times daily. 12/31/15   Dione Boozeavid Glick, MD  aspirin EC 81 MG tablet Take 81 mg by mouth daily.    Historical Provider, MD  cephALEXin (KEFLEX) 500 MG capsule Take 1 capsule (500 mg total) by mouth 4 (four) times daily. 01/02/16   Jacalyn LefevreJulie Sydne Krahl, MD  digoxin (LANOXIN) 0.25 MG tablet Take 1 tablet (0.25 mg total) by mouth daily. 10/23/14 09/20/17  Ripudeep Jenna LuoK Rai, MD  divalproex (DEPAKOTE ER) 500 MG 24 hr tablet Take 2 tablets (1,000 mg total) by mouth at bedtime. 11/28/15   Levert FeinsteinYijun Yan, MD  furosemide (LASIX) 40 MG tablet TAKE 1 AND 1/2 TABLETS ONCE DAILY 02/15/15   Mihai Croitoru, MD  magnesium oxide (MAG-OX) 400 MG tablet Take 1 tablet (400 mg total) by mouth 2 (two) times daily. 10/23/14   Ripudeep Jenna LuoK Rai, MD  metFORMIN (GLUCOPHAGE) 1000 MG tablet Take 1 tablet (1,000 mg total) by mouth 2 (two) times daily with a meal. 10/23/14   Ripudeep Jenna LuoK Rai, MD  ondansetron (ZOFRAN ODT) 4 MG disintegrating tablet Take 1 tablet (4 mg total) by mouth every 8 (eight) hours as needed for nausea or vomiting. 01/02/16   Jacalyn LefevreJulie Isacc Turney, MD  oxyCODONE (ROXICODONE) 5 MG immediate release tablet Take 1 tablet (5 mg total) by mouth every 4 (four) hours as needed for severe pain. 01/02/16   Jacalyn LefevreJulie Lynard Postlewait, MD  potassium  chloride (K-DUR) 10 MEQ tablet Take 1 tablet (10 mEq total) by mouth daily. 02/15/15   Mihai Croitoru, MD  pravastatin (PRAVACHOL) 40 MG tablet Take 1 tablet (40 mg total) by mouth daily. 10/23/14   Ripudeep Jenna LuoK Rai, MD  sulfamethoxazole-trimethoprim (BACTRIM DS,SEPTRA DS) 800-160 MG tablet Take 1 tablet by mouth 2 (two) times daily. 01/02/16 01/09/16  Jacalyn LefevreJulie Yunior Jain, MD    Family History Family History  Problem Relation Age of Onset  . Heart failure Mother   . Heart failure Father     Social History Social History  Substance Use Topics  . Smoking status: Former Smoker    Packs/day: 2.00    Years: 40.00    Types: Cigarettes    Quit date: 04/08/1992  . Smokeless tobacco: Former NeurosurgeonUser  . Alcohol use No     Comment: 08/17/2012 "quit drinking in 1983"  Allergies   Lipitor [atorvastatin]; Percocet [oxycodone-acetaminophen]; Procaine hcl; Tramadol; and Vicodin [hydrocodone-acetaminophen]   Review of Systems Review of Systems  Genitourinary:       Urine not looking "right."  Musculoskeletal:       Right knee pain  All other systems reviewed and are negative.    Physical Exam Updated Vital Signs BP 113/76 (BP Location: Left Arm)   Pulse 88   Temp 97.8 F (36.6 C) (Oral)   Resp 16   Ht 5\' 4"  (1.626 m)   Wt 159 lb (72.1 kg)   SpO2 98%   BMI 27.29 kg/m   Physical Exam  Constitutional: He is oriented to person, place, and time. He appears well-developed and well-nourished.  HENT:  Head: Normocephalic.  Right Ear: External ear normal.  Left Ear: External ear normal.  Nose: Nose normal.  Mouth/Throat: Oropharynx is clear and moist.  Lower lip with healing laceration  Eyes: Conjunctivae and EOM are normal. Pupils are equal, round, and reactive to light.  Neck: Normal range of motion. Neck supple.  Cardiovascular: Normal rate, regular rhythm, normal heart sounds and intact distal pulses.   Pulmonary/Chest: Effort normal and breath sounds normal.  Abdominal: Soft. Bowel  sounds are normal.  Musculoskeletal:       Legs: Neurological: He is alert and oriented to person, place, and time.  Skin: Skin is warm.  Psychiatric: He has a normal mood and affect. His behavior is normal. Judgment and thought content normal.  Nursing note and vitals reviewed.    ED Treatments / Results  Labs (all labs ordered are listed, but only abnormal results are displayed) Labs Reviewed  URINALYSIS, ROUTINE W REFLEX MICROSCOPIC (NOT AT [facility]) - Abnormal; Notable for the following:       Result Value   Color, Urine AMBER (*)    Hgb urine dipstick MODERATE (*)    Bilirubin Urine SMALL (*)    Ketones, ur TRACE (*)    Protein, ur 30 (*)    All other components within normal limits  URINE MICROSCOPIC-ADD ON - Abnormal; Notable for the following:    Squamous Epithelial / LPF 0-5 (*)    Bacteria, UA FEW (*)    Casts HYALINE CASTS (*)    All other components within normal limits    EKG  EKG Interpretation None       Radiology Dg Knee Complete 4 Views Right  Result Date: 01/02/2016 CLINICAL DATA:  He fell in the [employer] parking lot Friday night trying to catch his buggy. Severe rt knee pain and swelling. EXAM: RIGHT KNEE - COMPLETE 4+ VIEW COMPARISON:  None. FINDINGS: Chondrocalcinosis in medial and lateral compartments. Early marginal spurs from the patellar articular surface and femoral condyles. No fracture or dislocation. Normal mineralization and alignment. Probable small effusion in the suprapatellar bursa. Patchy arterial calcifications noted. IMPRESSION: 1. Negative for fracture or other acute bone abnormality. 2. Early tricompartmental degenerative change, with chondrocalcinosis, suggesting CPPD. 3. Small effusion. 4. Vascular calcifications. Electronically Signed   By: Corlis Leak M.D.   On: 01/02/2016 14:41    Procedures Procedures (including critical care time)  Medications Ordered in ED Medications  cephALEXin (KEFLEX) capsule 500 mg (500 mg Oral Given 01/02/16  1638)  fentaNYL (SUBLIMAZE) injection 50 mcg (50 mcg Intramuscular Given 01/02/16 1640)  bacitracin 500 UNIT/GM ointment (  Given 01/02/16 1730)     Initial Impression / Assessment and Plan / ED Course  I have reviewed the triage vital signs and the nursing notes.  Pertinent labs & imaging results that were available during my care of the patient were reviewed by me and considered in my medical decision making (see chart for details).  Clinical Course   Pt's wound was cleaned.  Pt will be placed on bactrim and keflex.  He is told to stop his amoxicillin.   He has had hematuria for at least 1 year.  Pt is encouraged to f/u with urology.  Pt knows to return if worse.  Final Clinical Impressions(s) / ED Diagnoses   Final diagnoses:  Cellulitis of right lower extremity  Hematuria    New Prescriptions New Prescriptions   CEPHALEXIN (KEFLEX) 500 MG CAPSULE    Take 1 capsule (500 mg total) by mouth 4 (four) times daily.   ONDANSETRON (ZOFRAN ODT) 4 MG DISINTEGRATING TABLET    Take 1 tablet (4 mg total) by mouth every 8 (eight) hours as needed for nausea or vomiting.   OXYCODONE (ROXICODONE) 5 MG IMMEDIATE RELEASE TABLET    Take 1 tablet (5 mg total) by mouth every 4 (four) hours as needed for severe pain.   SULFAMETHOXAZOLE-TRIMETHOPRIM (BACTRIM DS,SEPTRA DS) 800-160 MG TABLET    Take 1 tablet by mouth 2 (two) times daily.     Jacalyn Lefevre, MD 01/02/16 [phone]

## 2016-01-02 NOTE — ED Triage Notes (Addendum)
Pt reports falling last Friday and busted lip open and fell on bilateral knees.  Pt was seen in ED and had stitches placed.  Pt c/o right knee pain, states he did not receive x-rays for his knee.  Pt also mentions that his urine did not look right this morning.

## 2016-01-03 NOTE — Telephone Encounter (Signed)
Called and discussed daughter Claris Che. Notes pt has had a lot of problems, fell recently in [place] parking lot and got staph infection from a cut on knee sustained during fall. He has also had fluid on lungs and legs. Dose of furosemide recently increased in response to this and he is doing better. She is not sure about his weight fluctuations. Thinks he is up and down but did not know last home weight. He refuses to see a PA. Has appt w Dr. Royann Shivers on Oct 9th and [name] on 11/14. Only wants an appt with [name] if available sooner. Aware I have added to waitlist. She brought up other concerns, mentioning pt has had blood in urine. Patient has already received instruction by PCP to follow up with urology about this if sustained for longer than 2 days. Caller is aware we can arrange a PA appt to see pt if he wishes. Aware to go to ER for any urgent concerns. Will keep current cardiology appts as-is and add patient to Dr. Landry Dyke waitlist as requested.

## 2016-01-03 NOTE — Telephone Encounter (Signed)
Daughter calling again,she says pt needs to be seen shotness of breath,fluid around his heart and in his lungs.

## 2016-01-05 ENCOUNTER — Encounter (HOSPITAL_COMMUNITY): Payer: Self-pay | Admitting: Emergency Medicine

## 2016-01-05 ENCOUNTER — Emergency Department (HOSPITAL_COMMUNITY)
Admission: EM | Admit: 2016-01-05 | Discharge: 2016-01-05 | Disposition: A | Discharge status: Home / Self Care | Payer: Medicare HMO | Attending: Emergency Medicine | Admitting: Emergency Medicine

## 2016-01-05 DIAGNOSIS — Z5189 Encounter for other specified aftercare: Secondary | ICD-10-CM

## 2016-01-05 DIAGNOSIS — R319 Hematuria, unspecified: Secondary | ICD-10-CM | POA: Insufficient documentation

## 2016-01-05 DIAGNOSIS — Z7982 Long term (current) use of aspirin: Secondary | ICD-10-CM | POA: Insufficient documentation

## 2016-01-05 DIAGNOSIS — E119 Type 2 diabetes mellitus without complications: Secondary | ICD-10-CM | POA: Diagnosis not present

## 2016-01-05 DIAGNOSIS — Z7984 Long term (current) use of oral hypoglycemic drugs: Secondary | ICD-10-CM | POA: Diagnosis not present

## 2016-01-05 DIAGNOSIS — Z4801 Encounter for change or removal of surgical wound dressing: Secondary | ICD-10-CM | POA: Insufficient documentation

## 2016-01-05 DIAGNOSIS — Z87891 Personal history of nicotine dependence: Secondary | ICD-10-CM | POA: Diagnosis not present

## 2016-01-05 DIAGNOSIS — Z79899 Other long term (current) drug therapy: Secondary | ICD-10-CM | POA: Insufficient documentation

## 2016-01-05 DIAGNOSIS — J449 Chronic obstructive pulmonary disease, unspecified: Secondary | ICD-10-CM | POA: Insufficient documentation

## 2016-01-05 LAB — URINALYSIS, ROUTINE W REFLEX MICROSCOPIC
Bilirubin Urine: NEGATIVE
Glucose, UA: NEGATIVE mg/dL
Ketones, ur: NEGATIVE mg/dL
LEUKOCYTES UA: NEGATIVE
Nitrite: NEGATIVE
Protein, ur: NEGATIVE mg/dL
SPECIFIC GRAVITY, URINE: 1.01 (ref 1.005–1.030)
pH: 6 (ref 5.0–8.0)

## 2016-01-05 LAB — URINE MICROSCOPIC-ADD ON
BACTERIA UA: NONE SEEN
WBC, UA: NONE SEEN WBC/hpf (ref 0–5)

## 2016-01-05 MED ORDER — BACITRACIN-NEOMYCIN-POLYMYXIN 400-5-5000 EX OINT
TOPICAL_OINTMENT | Freq: Once | CUTANEOUS | Status: AC
  Administered 2016-01-05: 1 via TOPICAL
  Filled 2016-01-05: qty 1

## 2016-01-05 NOTE — ED Triage Notes (Signed)
Pt seen here 3 nights ago for staph infection in the skin of the R knee. Pt reports increasing redness and worsening of symptoms. Pt ambulatory to Triage but states walking is becoming more difficult.

## 2016-01-05 NOTE — ED Provider Notes (Signed)
[facility]-EM[facility]Y DEPT Provider Note   CSN: [id number] Arrival date & time: 01/05/16  1801     History   Chief Complaint Chief Complaint  Patient presents with  . Knee Pain    HPI [name] is a 81 y.o. male who returns for a wound check of his right knee.  He initially abraded the knee when he fell one week ago in a local store, then was seen here 3 days ago at which time he was placed on keflex and bactrim for a wound cellulitis.  He reports persistent pain although states the reddened area is smaller and improved.  He additionally still has stitches embedded under scabbing of his lip from laceration repair which took place at the initial visit and he has no complaint about this injury.  There was blood in his urine at his last visit, which he states is lighter today.  He denies back pain or injury, denies flank or abdominal pain.  He has a history of kidney stones and is followed by Dr. Annabell HowellsWrenn.   HPI  Past Medical History:  Diagnosis Date  . Arteriosclerotic cardiovascular disease (ASCVD)   . At risk for sudden cardiac death 08/18/2012  . Atrial fibrillation (HCC)    Onset in 2012  . Atrial flutter (HCC)   . Bleeding in brain due to brain aneurysm [facility](HCC) October 06 2014  . CHF (congestive heart failure) (HCC)   . Chronic anticoagulation 2012   2012  . COPD (chronic obstructive pulmonary disease) (HCC)   . DJD (degenerative joint disease)   . Gout   . Hyperlipidemia   . ICD (implantable cardiac defibrillator) in place   . Ischemic cardiomyopathy   . Kidney stone    "just once" (08/17/2012)  . Myocardial infarction (HCC) 1998  . NICM (nonischemic cardiomyopathy), EF 25-30% 08/18/2012  . Obstructive sleep apnea    "went away when I lost a bunch of weight" (08/17/2012)  . Permanent atrial fibrillation (HCC) 02/15/2011   Initial onset in 03/2011 with rapid ventricular response   . S/P ICD (internal cardiac defibrillator) procedure, 08/17/12, [employer] 08/18/2012   [employer]  . Type II diabetes mellitus [facility](HCC)     Patient Active Problem List   Diagnosis Date Noted  . Acute on chronic combined systolic and diastolic CHF (congestive heart failure) (HCC) 02/17/2015  . Chest pain 10/21/2014  . Transient alteration of awareness   . Systolic CHF, chronic (HCC)   . Subarachnoid hemorrhage (HCC)   . Cerebral thrombosis with cerebral infarction (HCC) 10/17/2014  . Seizure-like activity (HCC) 10/16/2014  . Syncope   . Subdural hematoma (HCC)   . Syncope and collapse   . Chronic systolic heart failure (HCC)   . Elevated troponin   . Other emphysema (HCC)   . Diabetes type 2, uncontrolled (HCC)   . HLD (hyperlipidemia)   . ACS (acute coronary syndrome) (HCC) 04/05/2014  . Sepsis (HCC) 04/05/2014  . Coronary artery disease 01/05/2014  . Cardiomyopathy, ischemic 03/26/2013  . Obstructive sleep apnea 03/26/2013  . Chronic combined systolic and diastolic CHF, NYHA class 2 (HCC) 09/24/2012  . At risk for sudden cardiac death 08/18/2012  . S/P ICD (internal cardiac defibrillator) procedure, 08/17/12, [employer] implanted 08/18/2012  . Fall 02/28/2012  . Traumatic subarachnoid hemorrhage (HCC) 02/28/2012  . Multiple fractures of ribs of left side 02/28/2012  . Hyperkalemia 06/25/2011  . Ureterolithiasis 06/25/2011  . Laboratory test 04/29/2011  . COPD (chronic obstructive pulmonary disease) (HCC)   .  Chronic anticoagulation, coumadin   . Permanent atrial fibrillation (HCC) 02/15/2011  . DIABETES MELLITUS, TYPE II 10/07/2008  . OBSTRUCTIVE SLEEP APNEA 10/07/2008  . Arteriosclerotic cardiovascular disease (ASCVD) 10/07/2008    Past Surgical History:  Procedure Laterality Date  . CARDIAC CATHETERIZATION  02/2003  . CARDIAC DEFIBRILLATOR PLACEMENT  08/17/2012   [employer]  . CATARACT EXTRACTION W/ INTRAOCULAR LENS  IMPLANT, BILATERAL Bilateral ~ 2011  . CHOLECYSTECTOMY  2009  . CORONARY ANGIOPLASTY WITH STENT PLACEMENT  07/14/1996   "1" (08/17/2012)    . CYSTOSCOPY/RETROGRADE/URETEROSCOPY  06/28/2011   Procedure: CYSTOSCOPY/RETROGRADE/URETEROSCOPY;  Surgeon: Ky Barban, MD;  Location: [facility] ORS;  Service: Urology;  Laterality: Right;  . IMPLANTABLE CARDIOVERTER DEFIBRILLATOR IMPLANT N/A 08/17/2012   Procedure: IMPLANTABLE CARDIOVERTER DEFIBRILLATOR IMPLANT;  Surgeon: Thurmon Fair, MD;  Location: [facility] CATH LAB;  Service: Cardiovascular;  Laterality: N/A;  . KNEE ARTHROPLASTY Left 1978   "tendon & cartilege repair" (08/17/2012)  . Myoview perfusion scan  06/02/2012   low risk, extensive scar entire LAD & RCA territory  . s/p icd  08/2012   [employer]  . STONE EXTRACTION WITH BASKET  06/28/2011   Procedure: STONE EXTRACTION WITH BASKET;  Surgeon: Ky Barban, MD;  Location: [facility] ORS;  Service: Urology;  Laterality: Right;  specimen given to family per MD  . US ECHOCARDIOGRAPHY  07/08/2012   EF <20%,mild MR,TR,LA severely dilated  . VASECTOMY  ~ 1964       Home Medications    Prior to Admission medications   Medication Sig Start Date End Date Taking? Authorizing Provider  aspirin EC 81 MG tablet Take 81 mg by mouth daily.   Yes Historical Provider, MD  cephALEXin (KEFLEX) 500 MG capsule Take 1 capsule (500 mg total) by mouth 4 (four) times daily. 01/02/16  Yes Jacalyn Lefevre, MD  digoxin (LANOXIN) 0.25 MG tablet Take 1 tablet (0.25 mg total) by mouth daily. 10/23/14 09/20/17 Yes Ripudeep Jenna Luo, MD  divalproex (DEPAKOTE ER) 500 MG 24 hr tablet Take 2 tablets (1,000 mg total) by mouth at bedtime. 11/28/15  Yes Levert Feinstein, MD  furosemide (LASIX) 40 MG tablet TAKE 1 AND 1/2 TABLETS ONCE DAILY 02/15/15  Yes Mihai Croitoru, MD  magnesium oxide (MAG-OX) 400 MG tablet Take 1 tablet (400 mg total) by mouth 2 (two) times daily. 10/23/14  Yes Ripudeep Jenna Luo, MD  metFORMIN (GLUCOPHAGE) 1000 MG tablet Take 1 tablet (1,000 mg total) by mouth 2 (two) times daily with a meal. 10/23/14  Yes Ripudeep K Rai, MD  ondansetron (ZOFRAN ODT) 4 MG  disintegrating tablet Take 1 tablet (4 mg total) by mouth every 8 (eight) hours as needed for nausea or vomiting. 01/02/16  Yes Jacalyn Lefevre, MD  oxyCODONE (ROXICODONE) 5 MG immediate release tablet Take 1 tablet (5 mg total) by mouth every 4 (four) hours as needed for severe pain. 01/02/16  Yes Jacalyn Lefevre, MD  potassium chloride (K-DUR) 10 MEQ tablet Take 1 tablet (10 mEq total) by mouth daily. 02/15/15  Yes Mihai Croitoru, MD  pravastatin (PRAVACHOL) 40 MG tablet Take 1 tablet (40 mg total) by mouth daily. 10/23/14  Yes Ripudeep Jenna Luo, MD  sulfamethoxazole-trimethoprim (BACTRIM DS,SEPTRA DS) 800-160 MG tablet Take 1 tablet by mouth 2 (two) times daily. 01/02/16 01/09/16 Yes Jacalyn Lefevre, MD  amoxicillin (AMOXIL) 500 MG capsule Take 1 capsule (500 mg total) by mouth 3 (three) times daily. Patient not taking: Reported on 01/05/2016 12/31/15   Dione Booze, MD    Family History Family History  Problem Relation Age of Onset  . Heart failure Mother   . Heart failure Father     Social History Social History  Substance Use Topics  . Smoking status: Former Smoker    Packs/day: 2.00    Years: 40.00    Types: Cigarettes    Quit date: 04/08/1992  . Smokeless tobacco: Former Neurosurgeon  . Alcohol use No     Comment: 08/17/2012 "quit drinking in 1983"     Allergies   Lipitor [atorvastatin]; Percocet [oxycodone-acetaminophen]; Procaine hcl; Tramadol; and Vicodin [hydrocodone-acetaminophen]   Review of Systems Review of Systems  Constitutional: Negative for chills and fever.  Respiratory: Negative for shortness of breath and wheezing.   Genitourinary: Positive for hematuria. Negative for decreased urine volume, dysuria, flank pain and urgency.  Skin: Positive for wound.  Neurological: Negative for numbness.     Physical Exam Updated Vital Signs BP 99/86 (BP Location: Left Arm)   Pulse 83   Temp 97.9 F (36.6 C) (Temporal)   Resp 16   Ht 5' 4.5" (1.638 m)   Wt 72.1 kg   SpO2 95%   BMI  26.87 kg/m   Physical Exam  Constitutional: He appears well-developed and well-nourished.  HENT:  Head: Normocephalic.  Well healing laceration lower lip, scabbed.  Old dependent bruising of chin.   Eyes: Conjunctivae are normal.  Neck: Normal range of motion.  Cardiovascular: Normal rate, regular rhythm and intact distal pulses.   Pulmonary/Chest: Effort normal and breath sounds normal. He has no wheezes. He has no rales.  Abdominal: Soft. Bowel sounds are normal. He exhibits no distension. There is no tenderness.  Musculoskeletal: Normal range of motion. He exhibits edema.  Bilateral ankle edema.  Neurological: He is alert.  Skin: Skin is warm and dry.  Dry abrasion noted right lower leg over tibial tuberosity.  No edema, no surrounding erythema or red streaking.  No fluctuance or induration.  Pink healthy skin edges around the periphery of a central dry scab.     Psychiatric: He has a normal mood and affect.  Nursing note and vitals reviewed.    ED Treatments / Results  Labs (all labs ordered are listed, but only abnormal results are displayed) Labs Reviewed  URINALYSIS, ROUTINE W REFLEX MICROSCOPIC (NOT AT [facility]) - Abnormal; Notable for the following:       Result Value   Hgb urine dipstick SMALL (*)    All other components within normal limits  URINE MICROSCOPIC-ADD ON - Abnormal; Notable for the following:    Squamous Epithelial / LPF 0-5 (*)    All other components within normal limits    EKG  EKG Interpretation None       Radiology No results found.  Procedures Procedures (including critical care time)  Medications Ordered in ED Medications  neomycin-bacitracin-polymyxin (NEOSPORIN) ointment (1 application Topical Given 01/05/16 1935)     Initial Impression / Assessment and Plan / ED Course  I have reviewed the triage vital signs and the nursing notes.  Pertinent labs & imaging results that were available during my care of the patient were reviewed by  me and considered in my medical decision making (see chart for details).  Clinical Course    Reassurance given that infection is responding to abx.  Advised to complete the abx.  May add topical neosporin/ dressing which may help with wound soreness as it heals. Advised f/u with urology given persistent hematuria.  Dg at bedside understands and will call Dr Belva Crome office for f/u  care.  Final Clinical Impressions(s) / ED Diagnoses   Final diagnoses:  Encounter for post-traumatic wound check  Hematuria    New Prescriptions Discharge Medication List as of 01/05/2016  8:56 PM       Burgess Amor, PA-C 01/05/16 2114    Maia Plan, MD 01/06/16 1101

## 2016-01-05 NOTE — Discharge Instructions (Signed)
Apply neosporin to your knee abrasion twice daily and complete the antibiotics you were prescribed at your last visit.  Follow up with Dr. Janna Arch in one week for a recheck if your symptoms persist.  The blood in your urine is still present but improving.

## 2016-01-08 ENCOUNTER — Ambulatory Visit (INDEPENDENT_AMBULATORY_CARE_PROVIDER_SITE_OTHER): Payer: Medicare HMO | Admitting: *Deleted

## 2016-01-08 DIAGNOSIS — I255 Ischemic cardiomyopathy: Secondary | ICD-10-CM

## 2016-01-08 NOTE — Progress Notes (Signed)
Remote ICD transmission.   

## 2016-01-15 ENCOUNTER — Encounter: Payer: Self-pay | Admitting: Cardiovascular Disease

## 2016-01-15 ENCOUNTER — Ambulatory Visit (INDEPENDENT_AMBULATORY_CARE_PROVIDER_SITE_OTHER): Payer: Medicare HMO | Admitting: Cardiovascular Disease

## 2016-01-15 VITALS — BP 94/57 | HR 83 | Ht 64.0 in | Wt 160.2 lb

## 2016-01-15 DIAGNOSIS — E785 Hyperlipidemia, unspecified: Secondary | ICD-10-CM | POA: Diagnosis not present

## 2016-01-15 DIAGNOSIS — I4821 Permanent atrial fibrillation: Secondary | ICD-10-CM

## 2016-01-15 DIAGNOSIS — I482 Chronic atrial fibrillation: Secondary | ICD-10-CM | POA: Diagnosis not present

## 2016-01-15 DIAGNOSIS — Z79899 Other long term (current) drug therapy: Secondary | ICD-10-CM | POA: Diagnosis not present

## 2016-01-15 DIAGNOSIS — Z95810 Presence of automatic (implantable) cardiac defibrillator: Secondary | ICD-10-CM

## 2016-01-15 DIAGNOSIS — I5042 Chronic combined systolic (congestive) and diastolic (congestive) heart failure: Secondary | ICD-10-CM

## 2016-01-15 DIAGNOSIS — J439 Emphysema, unspecified: Secondary | ICD-10-CM

## 2016-01-15 MED ORDER — FUROSEMIDE 40 MG PO TABS: 60.0000 mg | ORAL_TABLET | Freq: Every day | ORAL | 11 refills | Status: DC

## 2016-01-15 MED ORDER — MAGNESIUM OXIDE 400 MG PO TABS: 400.0000 mg | ORAL_TABLET | Freq: Two times a day (BID) | ORAL | 11 refills | Status: AC

## 2016-01-15 MED ORDER — DIGOXIN 250 MCG PO TABS: 0.2500 mg | ORAL_TABLET | Freq: Every day | ORAL | 11 refills | Status: DC

## 2016-01-15 MED ORDER — PRAVASTATIN SODIUM 40 MG PO TABS: 40.0000 mg | ORAL_TABLET | Freq: Every day | ORAL | 11 refills | Status: AC

## 2016-01-15 MED ORDER — POTASSIUM CHLORIDE ER 10 MEQ PO TBCR: 10.0000 meq | EXTENDED_RELEASE_TABLET | Freq: Every day | ORAL | 11 refills | Status: AC

## 2016-01-15 NOTE — Progress Notes (Signed)
Cardiology Office Note    Date:  01/15/2016   ID:  [name], DOB 1934-07-15, MRN [id number]  PCP:  [name], MD  Cardiologist: Nicki Guadalajara, M.D.;  Thurmon Fair, MD   Chief Complaint  Patient presents with  . Follow-up    History of Present Illness:  [name] is a 81 y.o. male who presents for defibrillator check. He has not seen [name] since I last saw him in November 2016, but he does have an appointment with him in November.  His daughter, who usually comes into his office visits, has been diagnosed with lymphoma is undergoing chemotherapy and is feeling poorly. 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 Because of this he has sometimes missed taking his furosemide and has more edema than usual. He denies shortness of breath however. He has not been weighing himself regularly at home. He has mild exertional dyspnea, overall NYHA functional class II on most days.  Last month he tripped and fell skinning both knees and [place] his lower lip. He lost balance while he was trying to catch a grocery cart in the parking lot. He has previously fallen and developed intracranial hemorrhage in 2016. For this reason he is not receiving anticoagulation although he is in atrial fibrillation.  Termination of a single-chamber defibrillator shows normal device function. His [employer] incepta device has another 9.5 years of estimated generator life. He only has 10% ventricular pacing. He has had 10 brief episodes of nonsustained ventricular tachycardia, most of which are probably rapid ventricular response. Heart rate histogram shows suboptimal control of atrial fibrillation rates, but 80% of the time his ventricular rate is under 100 bpm. Unfortunately, his hypotension precludes the use of most AV nodal blocking agents. He is only on digoxin.  [name] had an anterior myocardial infarction in 1998 and subsequently underwent percutaneous  interventions to the LAD artery and right coronary artery. He has severe ischemic cardiomyopathy with an ejection fraction of around 20% and long-standing combined systolic and diastolic heart failure, usually NYHA functional class II-III. He has permanent atrial fibrillation but is not taking anticoagulation therapy due to history of previous intracranial hemorrhage. He has an 80-pack-year history of smoking and COPD.     Past Medical History:  Diagnosis Date  . Arteriosclerotic cardiovascular disease (ASCVD)   . At risk for sudden cardiac death Sep 13, 2012  . Atrial fibrillation (HCC)    Onset in 2012  . Atrial flutter (HCC)   . Bleeding in brain due to brain aneurysm [facility]) October 06 2014  . CHF (congestive heart failure) (HCC)   . Chronic anticoagulation 2012   2012  . COPD (chronic obstructive pulmonary disease) (HCC)   . DJD (degenerative joint disease)   . Gout   . Hyperlipidemia   . ICD (implantable cardiac defibrillator) in place   . Ischemic cardiomyopathy   . Kidney stone    "just once" (08/17/2012)  . Myocardial infarction 1998  . NICM (nonischemic cardiomyopathy), EF 25-30% 09-13-12  . Obstructive sleep apnea    "went away when I lost a bunch of weight" (08/17/2012)  . Permanent atrial fibrillation (HCC) 02/15/2011   Initial onset in 03/2011 with rapid ventricular response   . S/P ICD (internal cardiac defibrillator) procedure, 08/17/12, [employer] 2012-09-13   [employer]  . Type II diabetes mellitus (HCC)     Past Surgical History:  Procedure Laterality Date  . CARDIAC CATHETERIZATION  02/2003  . CARDIAC DEFIBRILLATOR  PLACEMENT  08/17/2012   [employer]  . CATARACT EXTRACTION W/ INTRAOCULAR LENS  IMPLANT, BILATERAL Bilateral ~ 2011  . CHOLECYSTECTOMY  2009  . CORONARY ANGIOPLASTY WITH STENT PLACEMENT  07/14/1996   "1" (08/17/2012)  . CYSTOSCOPY/RETROGRADE/URETEROSCOPY  06/28/2011   Procedure: CYSTOSCOPY/RETROGRADE/URETEROSCOPY;  Surgeon: Ky BarbanMohammad I Javaid, MD;   Location: [facility] ORS;  Service: Urology;  Laterality: Right;  . IMPLANTABLE CARDIOVERTER DEFIBRILLATOR IMPLANT N/A 08/17/2012   Procedure: IMPLANTABLE CARDIOVERTER DEFIBRILLATOR IMPLANT;  Surgeon: Thurmon FairMihai Marjo Grosvenor, MD;  Location: [facility] CATH LAB;  Service: Cardiovascular;  Laterality: N/A;  . KNEE ARTHROPLASTY Left 1978   "tendon & cartilege repair" (08/17/2012)  . Myoview perfusion scan  06/02/2012   low risk, extensive scar entire LAD & RCA territory  . s/p icd  08/2012   [employer]  . STONE EXTRACTION WITH BASKET  06/28/2011   Procedure: STONE EXTRACTION WITH BASKET;  Surgeon: Ky BarbanMohammad I Javaid, MD;  Location: [facility] ORS;  Service: Urology;  Laterality: Right;  specimen given to family per MD  . US ECHOCARDIOGRAPHY  07/08/2012   EF <20%,mild MR,TR,LA severely dilated  . VASECTOMY  ~ 1964    Current Medications: Outpatient Medications Prior to Visit  Medication Sig Dispense Refill  . amoxicillin (AMOXIL) 500 MG capsule Take 1 capsule (500 mg total) by mouth 3 (three) times daily. 9 capsule 0  . aspirin EC 81 MG tablet Take 81 mg by mouth daily.    . cephALEXin (KEFLEX) 500 MG capsule Take 1 capsule (500 mg total) by mouth 4 (four) times daily. 20 capsule 0  . divalproex (DEPAKOTE ER) 500 MG 24 hr tablet Take 2 tablets (1,000 mg total) by mouth at bedtime. 60 tablet 11  . metFORMIN (GLUCOPHAGE) 1000 MG tablet Take 1 tablet (1,000 mg total) by mouth 2 (two) times daily with a meal. 60 tablet 3  . ondansetron (ZOFRAN ODT) 4 MG disintegrating tablet Take 1 tablet (4 mg total) by mouth every 8 (eight) hours as needed for nausea or vomiting. 20 tablet 0  . oxyCODONE (ROXICODONE) 5 MG immediate release tablet Take 1 tablet (5 mg total) by mouth every 4 (four) hours as needed for severe pain. 6 tablet 0  . digoxin (LANOXIN) 0.25 MG tablet Take 1 tablet (0.25 mg total) by mouth daily. 30 tablet 3  . furosemide (LASIX) 40 MG tablet TAKE 1 AND 1/2 TABLETS ONCE DAILY 45 tablet 6  . magnesium oxide (MAG-OX) 400  MG tablet Take 1 tablet (400 mg total) by mouth 2 (two) times daily. 60 tablet 2  . potassium chloride (K-DUR) 10 MEQ tablet Take 1 tablet (10 mEq total) by mouth daily. 30 tablet 6  . pravastatin (PRAVACHOL) 40 MG tablet Take 1 tablet (40 mg total) by mouth daily. 30 tablet 3   No facility-administered medications prior to visit.      Allergies:   Lipitor [atorvastatin]; Percocet [oxycodone-acetaminophen]; Procaine hcl; Tramadol; and Vicodin [hydrocodone-acetaminophen]   Social History   Social History  . Marital status: Married    Spouse name: N/A  . Number of children: 3  . Years of education: 9th   Occupational History  . Retired    Social History Main Topics  . Smoking status: Former Smoker    Packs/day: 2.00    Years: 40.00    Types: Cigarettes    Quit date: 04/08/1992  . Smokeless tobacco: Former NeurosurgeonUser  . Alcohol use No     Comment: 08/17/2012 "quit drinking in 1983"  . Drug use: No  . Sexual activity:  No   Other Topics Concern  . None   Social History Narrative   Lives at home with his wife.   Right-handed.   No caffeine use.     Family History:  The patient's family history includes Heart failure in his father and mother.   ROS:   Please see the history of present illness.    ROS All other systems reviewed and are negative.   PHYSICAL EXAM:   VS:  BP (!) 94/57 (BP Location: Left Arm, Patient Position: Sitting, Cuff Size: Normal)   Pulse 83   Ht 5\' 4"  (1.626 m)   Wt 160 lb 3.2 oz (72.7 kg)   SpO2 97%   BMI 27.50 kg/m    GEN: Well nourished, well developed, in no acute distress  HEENT: normal  Neck: no JVD, carotid bruits, or masses Cardiac: Irregular; no murmurs, rubs, or gallops, 1-2 plus hard pitting bilateral pedal and ankle edema . Healthy subclavian defibrillator site Respiratory:  Diminished breath sounds throughout but without wheezing and generally clear to auscultation bilaterally, normal work of breathing GI: soft, nontender, nondistended, +  BS MS: no deformity or atrophy  Skin: warm and dry, no rash Neuro:  Alert and Oriented x 3, Strength and sensation are intact Psych: euthymic mood, full affect  Wt Readings from Last 3 Encounters:  01/15/16 160 lb 3.2 oz (72.7 kg)  01/05/16 159 lb (72.1 kg)  01/02/16 159 lb (72.1 kg)      Studies/Labs Reviewed:   EKG:  EKG is ordered today.  The ekg ordered today demonstrates Atrial fibrillation, nonspecific intraventricular conduction delay 132 ms, normal QTC 397 ms  Recent Labs: 11/28/2015: TSH 2.100 12/22/2015: ALT 21; B Natriuretic Peptide 1,317.0; Hemoglobin 15.8; Magnesium 1.8; Platelets 152 12/23/2015: BUN 31; Creatinine, Ser 1.35; Potassium 3.8; Sodium 137   Lipid Panel    Component Value Date/Time   CHOL 141 10/16/2014 1200   TRIG 532 (H) 10/16/2014 1200   HDL 25 (L) 10/16/2014 1200   CHOLHDL 5.6 10/16/2014 1200   VLDL UNABLE TO CALCULATE IF TRIGLYCERIDE OVER 400 mg/dL 16/01/9603 5409   LDLCALC UNABLE TO CALCULATE IF TRIGLYCERIDE OVER 400 mg/dL 81/19/1478 2956    Additional studies/ records that were reviewed today include:  Records from he the [facility] ED visits for trauma    ASSESSMENT:    1. Chronic combined systolic and diastolic CHF, NYHA class 2 (HCC)   2. Permanent atrial fibrillation (HCC)   3. S/P ICD (internal cardiac defibrillator) procedure   4. Pulmonary emphysema, unspecified emphysema type (HCC)   5. Dyslipidemia   6. Medication management      PLAN:  In order of problems listed above:  1. CHF: He appears to be mildly hypervolemic but generally well compensated. He has been skipping diuretics on the days that he takes his daughter to chemotherapy. However, she is therefore 5 or 6 hours and he has to wait for her to be done. He might as well take his diuretic while they are there. Encouraged him to start weighing himself daily. His blood pressure precludes the use of ace inhibitors and beta blockers. If his blood pressure does improve, he will benefit  from beta blockers for both ventricular rate control and heart failure and prevention of ventricular arrhythmia. He has an appointment with [name] next month 2. AFib: Unable to anticoagulate due to frequent falls, as recently as last month, previous intracranial hemorrhage. Rate control is suboptimal, but not too bad on digoxin monotherapy. Again, due to  his blood pressure the only other reasonable option would be amiodarone. 3. ICD: Normal device function. Very rare episodes of true nonsustained VT. Most high ventricular rate episodes represent atrial fibrillation rapid ventricular response. Continue remote downloads every 3 months and yearly office visit 4. COPD: He quit smoking in 1994. 5. HLP: He is going to see his primary care provider tomorrow. We'll give him a lab slip for lipid profile and cmet.    Medication Adjustments/Labs and Tests Ordered: Current medicines are reviewed at length with the patient today.  Concerns regarding medicines are outlined above.  Medication changes, Labs and Tests ordered today are listed in the Patient Instructions below. Patient Instructions  Dr Royann Shivers recommends that you continue on your current medications as directed. Please refer to the Current Medication list given to you today.  Your physician recommends that you return for lab work at your earliest convenience - FASTING.  Remote monitoring is used to monitor your Pacemaker of ICD from home. This monitoring reduces the number of office visits required to check your device to one time per year. It allows [place] to keep an eye on the functioning of your device to ensure it is working properly. You are scheduled for a device check from home on Monday, January 8th, 2018. You may send your transmission at any time that day. If you have a wireless device, the transmission will be sent automatically. After your physician reviews your transmission, you will receive a postcard with your next transmission  date.  Dr Royann Shivers recommends that you schedule a follow-up appointment in 12 months with a device check. You will receive a reminder letter in the mail two months in advance. If you don't receive a letter, please call our office to schedule the follow-up appointment.  If you need a refill on your cardiac medications before your next appointment, please call your pharmacy.    Signed, Thurmon Fair, MD  01/15/2016 5:44 PM    [name] [address] [place], [place], [place]  [address] Phone: [phone]; Fax: [phone]

## 2016-01-15 NOTE — Patient Instructions (Signed)
Dr Royann Shivers recommends that you continue on your current medications as directed. Please refer to the Current Medication list given to you today.  Your physician recommends that you return for lab work at your earliest convenience - FASTING.  Remote monitoring is used to monitor your Pacemaker of ICD from home. This monitoring reduces the number of office visits required to check your device to one time per year. It allows [place] to keep an eye on the functioning of your device to ensure it is working properly. You are scheduled for a device check from home on Monday, January 8th, 2018. You may send your transmission at any time that day. If you have a wireless device, the transmission will be sent automatically. After your physician reviews your transmission, you will receive a postcard with your next transmission date.  Dr Royann Shivers recommends that you schedule a follow-up appointment in 12 months with a device check. You will receive a reminder letter in the mail two months in advance. If you don't receive a letter, please call our office to schedule the follow-up appointment.  If you need a refill on your cardiac medications before your next appointment, please call your pharmacy.

## 2016-01-16 LAB — COMPREHENSIVE METABOLIC PANEL
ALBUMIN: 3.6 g/dL (ref 3.6–5.1)
ALK PHOS: 48 U/L (ref 40–115)
ALT: 12 U/L (ref 9–46)
AST: 21 U/L (ref 10–35)
BUN: 26 mg/dL — AB (ref 7–25)
CO2: 30 mmol/L (ref 20–31)
CREATININE: 1.35 mg/dL — AB (ref 0.70–1.18)
Calcium: 9.7 mg/dL (ref 8.6–10.3)
Chloride: 102 mmol/L (ref 98–110)
Glucose, Bld: 91 mg/dL (ref 65–99)
POTASSIUM: 4.7 mmol/L (ref 3.5–5.3)
Sodium: 140 mmol/L (ref 135–146)
TOTAL PROTEIN: 5.7 g/dL — AB (ref 6.1–8.1)
Total Bilirubin: 0.6 mg/dL (ref 0.2–1.2)

## 2016-01-16 LAB — LIPID PANEL
Cholesterol: 101 mg/dL — ABNORMAL LOW (ref 125–200)
HDL: 36 mg/dL — ABNORMAL LOW (ref >=40)
LDL CALC: 46 mg/dL (ref <130)
TRIGLYCERIDES: 97 mg/dL (ref <150)
Total CHOL/HDL Ratio: 2.8 Ratio (ref <=5.0)
VLDL: 19 mg/dL (ref <30)

## 2016-01-16 IMAGING — DX DG CHEST 2V
2 series · 2 of 2 positions shown · non-contrast
Comparison: 10/16/2014

CLINICAL DATA: Weakness and dizziness.

EXAM:
CHEST  2 VIEW

[chest lat]
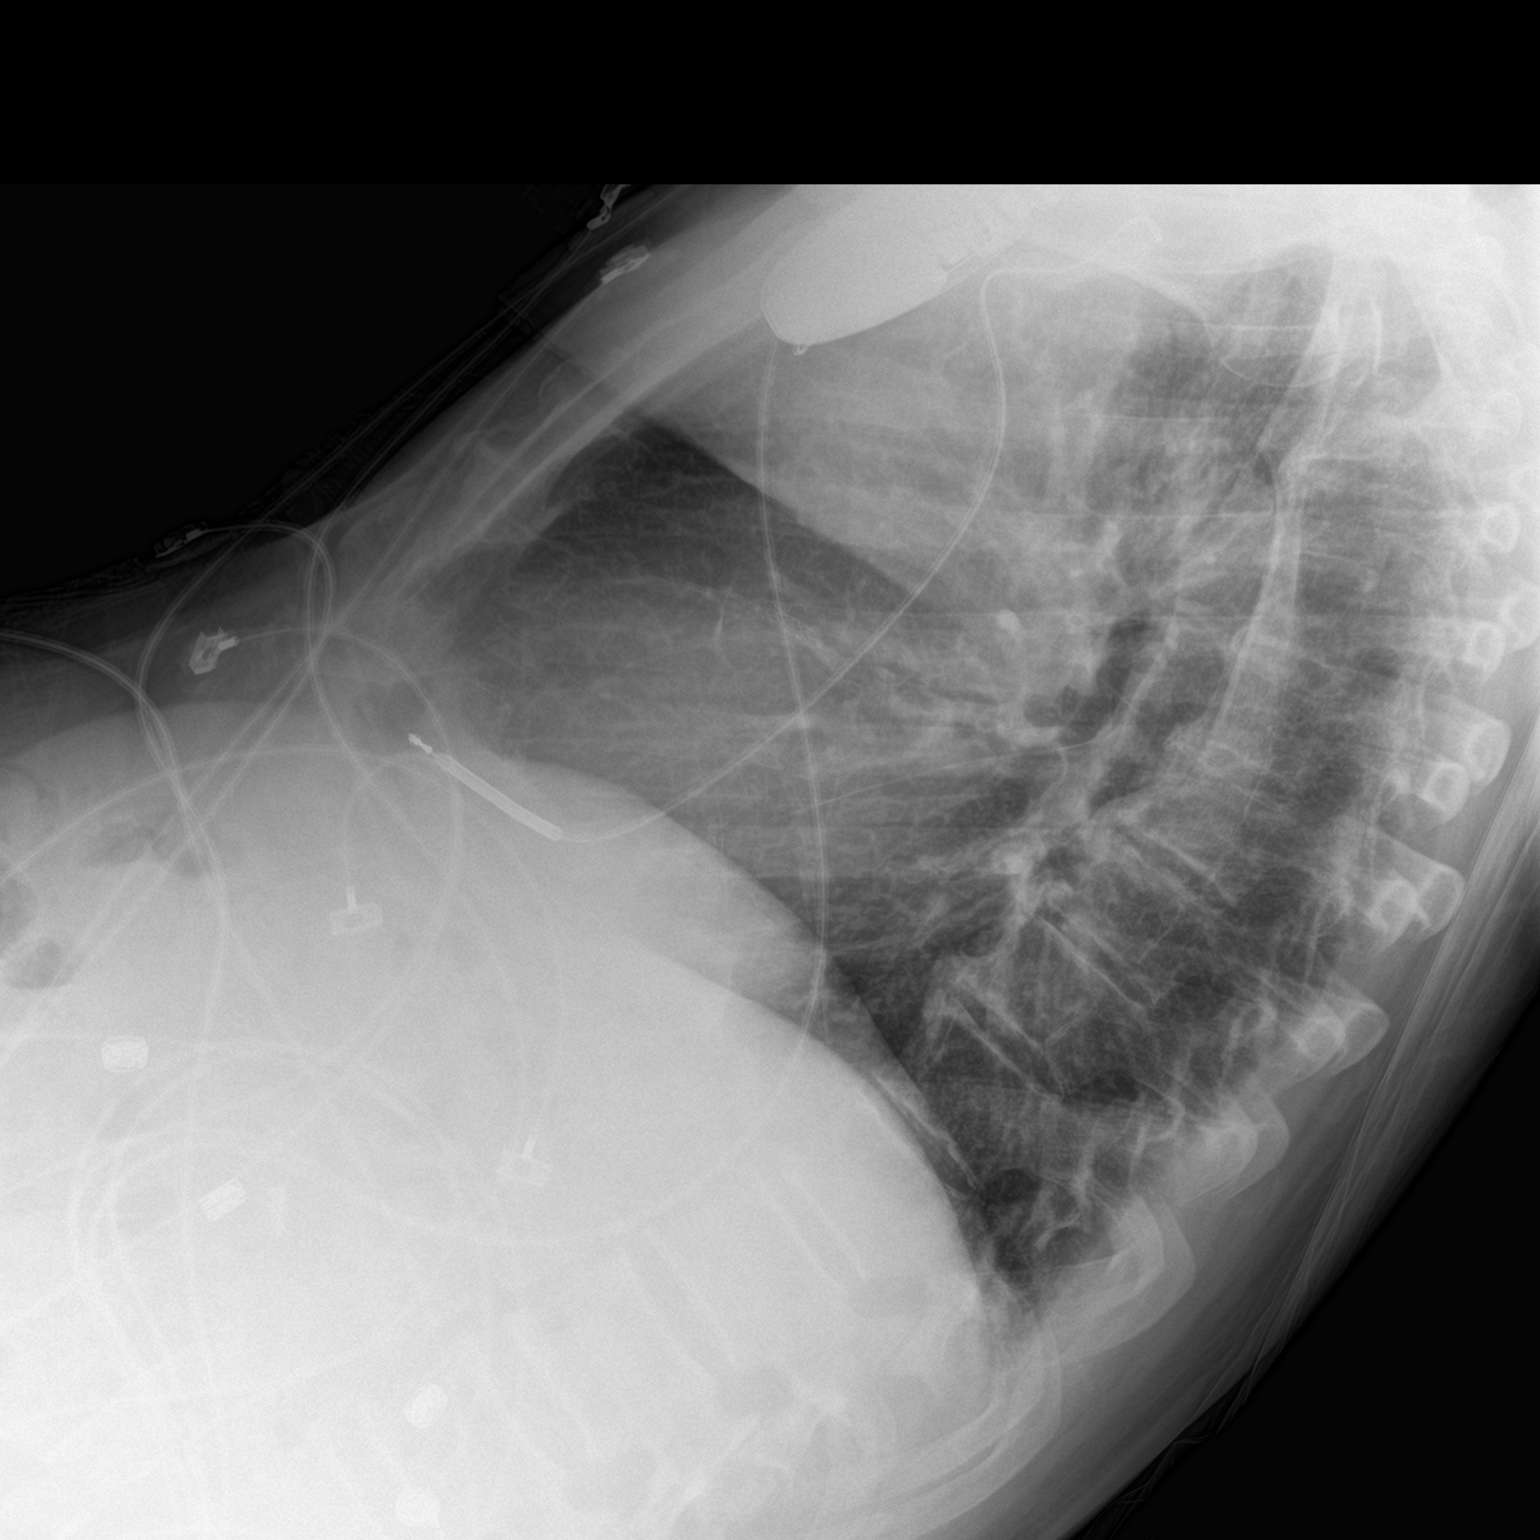

[chest ap]
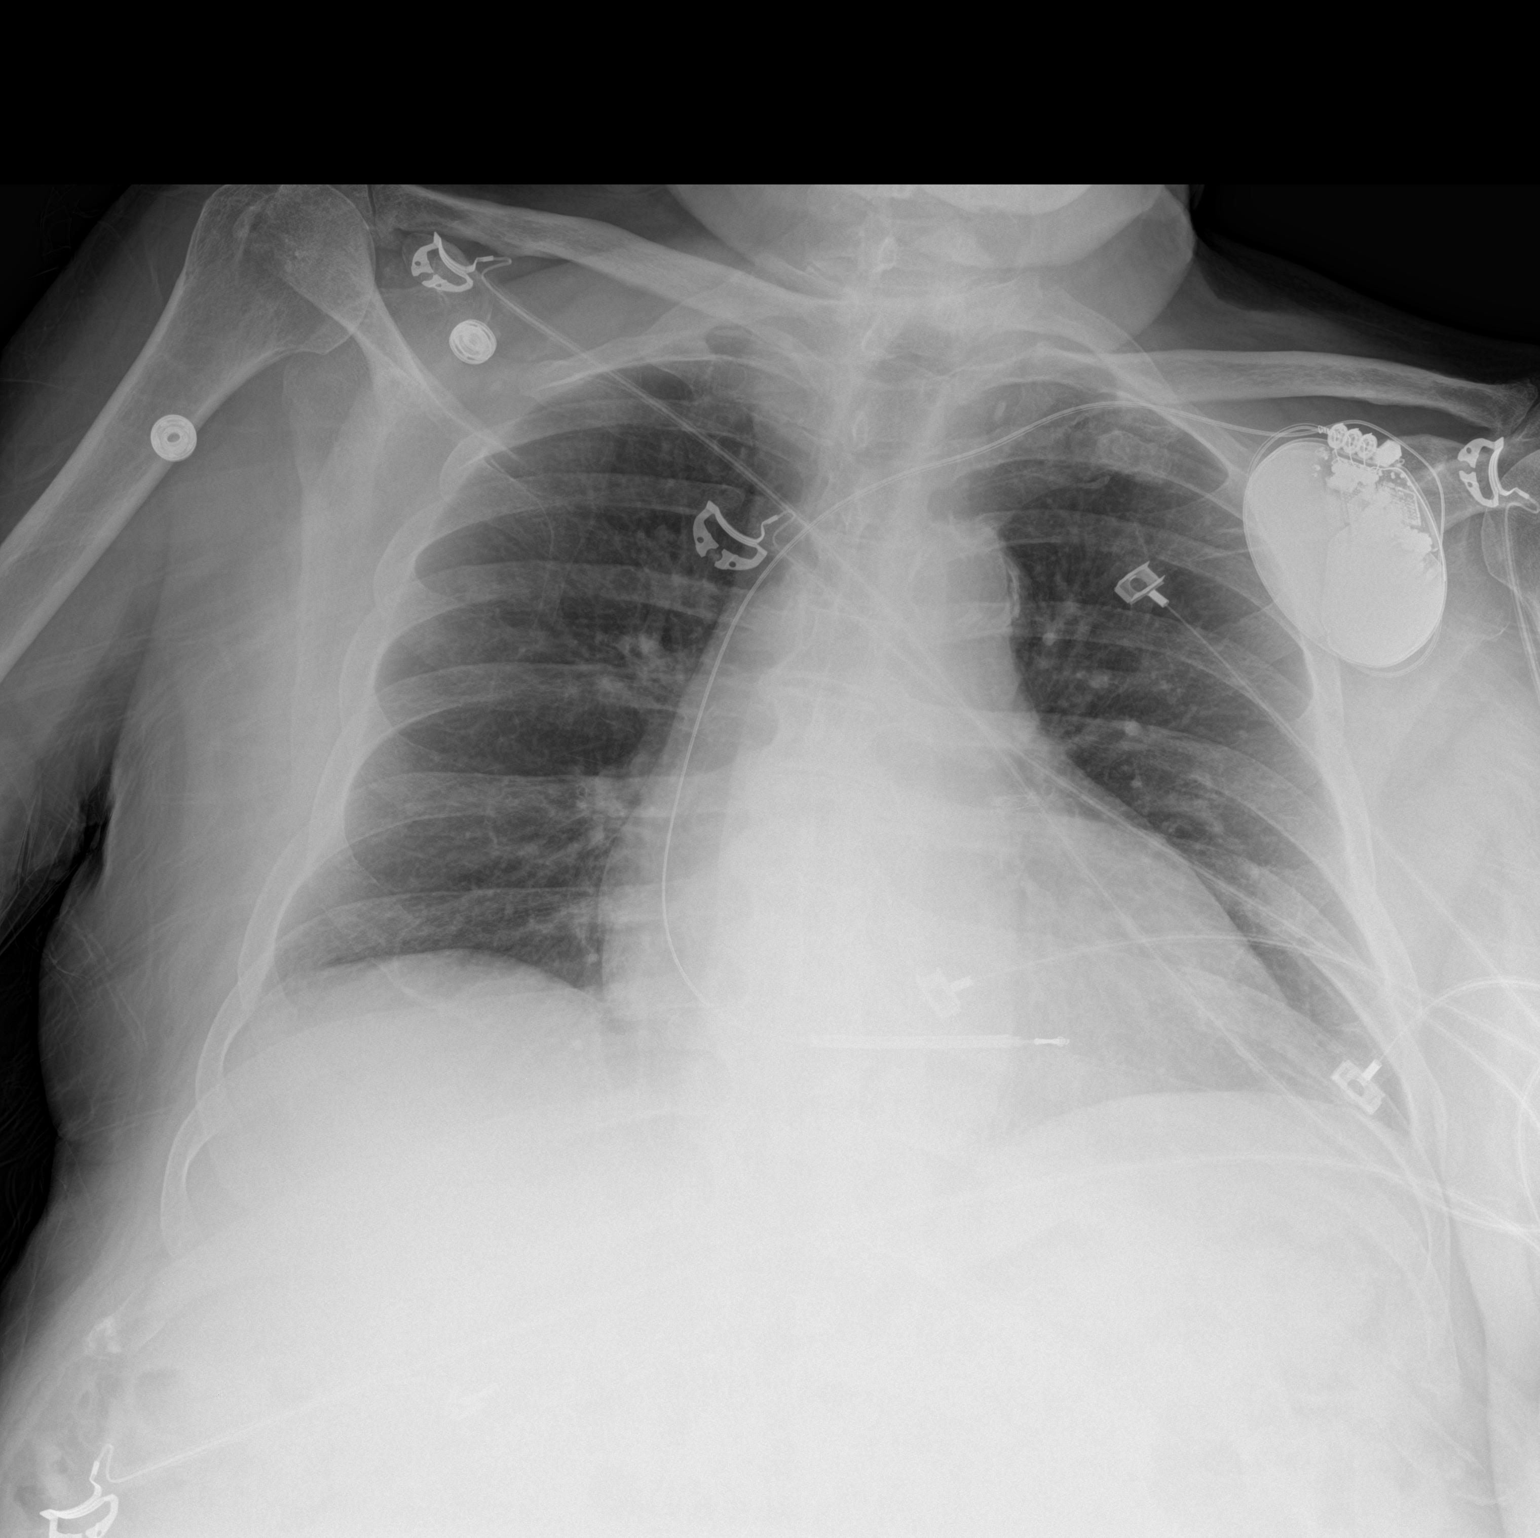

[2 of 2 positions shown; findings below may reference images not displayed]

FINDINGS: Mild enlargement of the cardiopericardial silhouette. No mediastinal
or hilar masses or evidence of adenopathy.

Single lead left anterior chest wall pacemaker is stable and well
positioned.

Clear lungs.  No pleural effusion or pneumothorax.

Bony thorax is demineralized but grossly intact.
IMPRESSION: No acute cardiopulmonary disease.

## 2016-02-09 ENCOUNTER — Encounter: Payer: Medicare HMO | Attending: Surgery | Admitting: Surgery

## 2016-02-09 DIAGNOSIS — M1990 Unspecified osteoarthritis, unspecified site: Secondary | ICD-10-CM | POA: Insufficient documentation

## 2016-02-09 DIAGNOSIS — I252 Old myocardial infarction: Secondary | ICD-10-CM | POA: Diagnosis not present

## 2016-02-09 DIAGNOSIS — Z87891 Personal history of nicotine dependence: Secondary | ICD-10-CM | POA: Insufficient documentation

## 2016-02-09 DIAGNOSIS — I890 Lymphedema, not elsewhere classified: Secondary | ICD-10-CM | POA: Insufficient documentation

## 2016-02-09 DIAGNOSIS — Z7901 Long term (current) use of anticoagulants: Secondary | ICD-10-CM | POA: Insufficient documentation

## 2016-02-09 DIAGNOSIS — I4891 Unspecified atrial fibrillation: Secondary | ICD-10-CM | POA: Insufficient documentation

## 2016-02-09 DIAGNOSIS — I2510 Atherosclerotic heart disease of native coronary artery without angina pectoris: Secondary | ICD-10-CM | POA: Insufficient documentation

## 2016-02-09 DIAGNOSIS — E11622 Type 2 diabetes mellitus with other skin ulcer: Secondary | ICD-10-CM | POA: Insufficient documentation

## 2016-02-09 DIAGNOSIS — I429 Cardiomyopathy, unspecified: Secondary | ICD-10-CM | POA: Insufficient documentation

## 2016-02-09 DIAGNOSIS — L97221 Non-pressure chronic ulcer of left calf limited to breakdown of skin: Secondary | ICD-10-CM | POA: Diagnosis not present

## 2016-02-09 DIAGNOSIS — M109 Gout, unspecified: Secondary | ICD-10-CM | POA: Insufficient documentation

## 2016-02-09 DIAGNOSIS — I5042 Chronic combined systolic (congestive) and diastolic (congestive) heart failure: Secondary | ICD-10-CM | POA: Diagnosis not present

## 2016-02-09 DIAGNOSIS — J449 Chronic obstructive pulmonary disease, unspecified: Secondary | ICD-10-CM | POA: Insufficient documentation

## 2016-02-10 NOTE — Progress Notes (Signed)
[name], [name] ([id number]) Visit Report for 02/09/2016 Abuse/Suicide Risk Screen Details Patient Name: [name], [name]. Date of Service: 02/09/2016 2:15 PM Medical Record Number: [id number] Patient Account Number: [id number] Date of Birth/Sex: 24-Nov-1934 (81 y.o. Male) Treating RN: Phillis Haggis Primary Care Physician: [name] Other Clinician: Referring Physician: Alona Bene Treating Physician/Extender: [name] in Treatment: 0 Abuse/Suicide Risk Screen Items Answer ABUSE/SUICIDE RISK SCREEN: Has anyone close to you tried to hurt or harm you recentlyo No Do you feel uncomfortable with anyone in your familyo No Has anyone forced you do things that you didnot want to doo No Do you have any thoughts of harming yourselfo No Patient displays signs or symptoms of abuse and/or neglect. No Electronic Signature(s) Signed: 02/09/2016 5:03:25 PM By: Alejandro Mulling Entered By: Alejandro Mulling on 02/09/2016 14:45:18 Clemence, Camerin N. ([id number]) -------------------------------------------------------------------------------- Activities of Daily Living Details Patient Name: [name], [name] Date of Service: 02/09/2016 2:15 PM Medical Record Number: [id number] Patient Account Number: [id number] Date of Birth/Sex: 1934/04/09 (81 y.o. Male) Treating RN: Phillis Haggis Primary Care Physician: [name] Other Clinician: Referring Physician: Alona Bene Treating Physician/Extender: [name] in Treatment: 0 Activities of Daily Living Items Answer Activities of Daily Living (Please select one for each item) Drive Automobile Completely Able Take Medications Completely Able Use Telephone Completely Able Care for Appearance Completely Able Use Toilet Completely Able Bath / Shower Completely Able Dress Self Completely Able Feed Self Completely Able Walk Completely Able Get In / Out Bed Completely Able Housework Completely Able Prepare Meals Completely  Able Handle Money Completely Able Shop for Self Completely Able Electronic Signature(s) Signed: 02/09/2016 5:03:25 PM By: Alejandro Mulling Entered By: Alejandro Mulling on 02/09/2016 14:45:51 Seabury, [name] N. ([id number]) -------------------------------------------------------------------------------- Education Assessment Details Patient Name: [name], [name] N. Date of Service: 02/09/2016 2:15 PM Medical Record Number: [id number] Patient Account Number: [id number] Date of Birth/Sex: 11-02-1934 (81 y.o. Male) Treating RN: Phillis Haggis Primary Care Physician: [name] Other Clinician: Referring Physician: Alona Bene Treating Physician/Extender: [name] in Treatment: 0 Primary Learner Assessed: Patient Learning Preferences/Education Level/Primary Language Learning Preference: Explanation, Printed Material Highest Education Level: Grade School Preferred Language: English Cognitive Barrier Assessment/Beliefs Language Barrier: No Translator Needed: No Memory Deficit: No Emotional Barrier: No Cultural/Religious Beliefs Affecting Medical No Care: Physical Barrier Assessment Impaired Vision: No Impaired Hearing: No Decreased Hand dexterity: No Knowledge/Comprehension Assessment Knowledge Level: High Comprehension Level: High Ability to understand written High instructions: Ability to understand verbal High instructions: Motivation Assessment Anxiety Level: Calm Cooperation: Cooperative Education Importance: Acknowledges Need Interest in Health Problems: Asks Questions Perception: Coherent Willingness to Engage in Self- High Management Activities: Readiness to Engage in Self- High Management Activities: Electronic Signature(s) Willard, Juanluis NMarland [name] ([id number]) Signed: 02/09/2016 5:03:25 PM By: Alejandro Mulling Entered By: Alejandro Mulling on 02/09/2016 14:47:00 Nakamura, Tyeson N.  ([id number]) -------------------------------------------------------------------------------- Fall Risk Assessment Details Patient Name: [name], [name] N. Date of Service: 02/09/2016 2:15 PM Medical Record Number: [id number] Patient Account Number: [id number] Date of Birth/Sex: 16-May-1934 (81 y.o. Male) Treating RN: Phillis Haggis Primary Care Physician: [name] Other Clinician: Referring Physician: Alona Bene Treating Physician/Extender: [name] in Treatment: 0 Fall Risk Assessment Items Have you had 2 or more falls in the last 12 monthso 0 Yes Have you had any fall that resulted in injury in the last 12 monthso 0 Yes FALL RISK ASSESSMENT: History of falling - immediate or within 3 months 25 Yes Secondary diagnosis 15 Yes Ambulatory aid None/bed rest/wheelchair/nurse 0 No Crutches/cane/walker 0 No Furniture  0 No IV Access/Saline Lock 0 No Gait/Training Normal/bed rest/immobile 0 No Weak 10 Yes Impaired 0 No Mental Status Oriented to own ability 0 Yes Electronic Signature(s) Signed: 02/09/2016 5:03:25 PM By: Alejandro MullingPinkerton, Debra Entered By: Alejandro MullingPinkerton, Debra on 02/09/2016 14:47:42 Cancio, Taiki N. ([id number]) -------------------------------------------------------------------------------- Foot Assessment Details Patient Name: [name] HudsonMOSER, [name] N. Date of Service: 02/09/2016 2:15 PM Medical Record Number: [id number] Patient Account Number: [id number] Date of Birth/Sex: [birth year](81 y.o. Male) Treating RN: Phillis HaggisPinkerton, Debi Primary Care Physician: [name] LinseyNDIEGO, [name] Other Clinician: Referring Physician: Alona BeneLONG, JOSHUA Treating Physician/Extender: [name] ReBritto, Errol Weeks in Treatment: 0 Foot Assessment Items Site Locations + = Sensation present, - = Sensation absent, C = Callus, U = Ulcer R = Redness, W = Warmth, M = Maceration, PU = Pre-ulcerative lesion F = Fissure, S = Swelling, D = Dryness Assessment Right: Left: Other Deformity: No No Prior Foot Ulcer: No No Prior  Amputation: No No Charcot Joint: No No Ambulatory Status: Ambulatory Without Help Gait: Steady Electronic Signature(s) Signed: 02/09/2016 5:03:25 PM By: Alejandro MullingPinkerton, Debra Entered By: Alejandro MullingPinkerton, Debra on 02/09/2016 14:51:18 Mcwilliams, Sharmarke N. ([id number]) -------------------------------------------------------------------------------- Nutrition Risk Assessment Details Patient Name: [name] HudsonMOSER, Frazer N. Date of Service: 02/09/2016 2:15 PM Medical Record Number: [id number] Patient Account Number: [id number] Date of Birth/Sex: [birth year](81 y.o. Male) Treating RN: Phillis HaggisPinkerton, Debi Primary Care Physician: [name] LinseyNDIEGO, [name] Other Clinician: Referring Physician: LONG, JOSHUA Treating Physician/Extender: [name] ReBritto, Errol Weeks in Treatment: 0 Height (in): 64 Weight (lbs): 162 Body Mass Index (BMI): 27.8 Nutrition Risk Assessment Items NUTRITION RISK SCREEN: I have an illness or condition that made me change the kind and/or 2 Yes amount of food I eat I eat fewer than two meals per day 3 Yes I eat few fruits and vegetables, or milk products 0 No I have three or more drinks of beer, liquor or wine almost every day 0 No I have tooth or mouth problems that make it hard for me to eat 0 No I don't always have enough money to buy the food I need 0 No I eat alone most of the time 0 No I take three or more different prescribed or over-the-counter drugs a 1 Yes day Without wanting to, I have lost or gained 10 pounds in the last six 0 No months I am not always physically able to shop, cook and/or feed myself 0 No Nutrition Protocols Good Risk Protocol Moderate Risk Protocol Electronic Signature(s) Signed: 02/09/2016 5:03:25 PM By: Alejandro MullingPinkerton, Debra Entered By: Alejandro MullingPinkerton, Debra on 02/09/2016 14:48:47

## 2016-02-10 NOTE — Progress Notes (Signed)
[name], Vollie Dorris CarnesN. ([id number]) Visit Report for 02/09/2016 Chief Complaint Document Details Patient Name: [name], Francesco N. Date of Service: 02/09/2016 2:15 PM Medical Record Number: [id number] Patient Account Number: [id number] Date of Birth/Sex: [birth year](81 y.o. Male) Treating RN: Phillis HaggisPinkerton, Debi Primary Care Physician: Oval LinseyNDIEGO, [name] Other Clinician: Referring Physician: Alona BeneLONG, JOSHUA Treating Physician/Extender: Rudene ReBritto, Benjamyn Hestand Weeks in Treatment: 0 Information Obtained from: Patient Chief Complaint Patient presents to the wound care center for a consult due non healing wound to the left lower extremity with bilateral lower extremity swelling Electronic Signature(s) Signed: 02/09/2016 3:48:34 PM By: Evlyn KannerBritto, Delrick Dehart MD, FACS Entered By: Evlyn KannerBritto, Aarron Wierzbicki on 02/09/2016 15:48:34 Colledge, Ianmichael N. ([id number]) -------------------------------------------------------------------------------- HPI Details Patient Name: [name], Adhvik N. Date of Service: 02/09/2016 2:15 PM Medical Record Number: [id number] Patient Account Number: [id number] Date of Birth/Sex: [birth year] [age] y.o. Male) Treating RN: Phillis HaggisPinkerton, Debi Primary Care Physician: Oval LinseyNDIEGO, [name] Other Clinician: Referring Physician: Alona BeneLONG, JOSHUA Treating Physician/Extender: Rudene ReBritto, Esther Bradstreet Weeks in Treatment: 0 History of Present Illness Location: bilateral lower extremity swelling with ulcerations on the left calf Quality: Patient reports experiencing a dull pain to affected area(s). Severity: Patient states wound are getting worse. Duration: Patient has had the wound for > 3 months prior to seeking treatment at the wound center Timing: Pain in wound is Intermittent (comes and goes Context: The wound appeared gradually over time Modifying Factors: Other treatment(s) tried include:diuretics and is on multiple cardiac medications Associated Signs and Symptoms: Patient reports having increase swelling. HPI Description: 81 year old gentleman with  multiple comorbidities as extensive lymphedema both lower extremities due to chronic combined systolic and diastolic heart failure. is been noncompliant with his diuretics and every now and then has ulcerations and weeping from his left lower extremity. Past medical history of diabetes mellitus type 2 which has been associated with hyperglycemia, atrial fibrillation, coronary artery disease, CHF, cardiomyopathy, subcoracoid hemorrhage, subdural hematoma, ureterolithiasis, chronic anticoagulation with Coumadin. he is status post cardiac catheterization, defibrillator placement, cholecystectomy, angioplasty of coronary vessels with stent, cystoscopy and retrograde ureteroscope E, knee arthroplasty, echocardiography of the heart. Electronic Signature(s) Signed: 02/09/2016 3:50:08 PM By: Evlyn KannerBritto, Kippy Melena MD, FACS Previous Signature: 02/09/2016 2:49:03 PM Version By: Evlyn KannerBritto, Daleiza Bacchi MD, FACS Entered By: Evlyn KannerBritto, Laurance Heide on 02/09/2016 15:50:07 Manzer, Yaviel NMarland Kitchen. ([id number]) -------------------------------------------------------------------------------- Physical Exam Details Patient Name: [name], Helmut N. Date of Service: 02/09/2016 2:15 PM Medical Record Number: [id number] Patient Account Number: [id number] Date of Birth/Sex: [birth year](81 y.o. Male) Treating RN: Phillis HaggisPinkerton, Debi Primary Care Physician: Oval LinseyNDIEGO, [name] Other Clinician: Referring Physician: LONG, JOSHUA Treating Physician/Extender: Rudene ReBritto, Enma Maeda Weeks in Treatment: 0 Constitutional . Pulse regular. Respirations normal and unlabored. Afebrile. . Eyes Nonicteric. Reactive to light. Ears, Nose, Mouth, and Throat Lips, teeth, and gums WNL.Marland Kitchen. Moist mucosa without lesions. Neck supple and nontender. No palpable supraclavicular or cervical adenopathy. Normal sized without goiter. Respiratory WNL. No retractions.. Breath sounds WNL, No rubs, rales, rhonchi, or wheeze.. Cardiovascular Pedal Pulses WNL. left ABI was noncompressible and right was  1.09. stage II lymphedema bilaterally. Chest Breasts symmetical and no nipple discharge.. Breast tissue WNL, no masses, lumps, or tenderness.. Lymphatic No adneopathy. No adenopathy. No adenopathy. Musculoskeletal Adexa without tenderness or enlargement.. Digits and nails w/o clubbing, cyanosis, infection, petechiae, ischemia, or inflammatory conditions.. Integumentary (Hair, Skin) No suspicious lesions. No crepitus or fluctuance. No peri-wound warmth or erythema. No masses.Marland Kitchen. Psychiatric Judgement and insight Intact.. No evidence of depression, anxiety, or agitation.. Notes besides the massive lymphedema both lower extremities he's got ulceration on the left lateral calf which are superficial in nature  and minimal debris washed out with moist saline gauze. No sharp dissection was required. Electronic Signature(s) Signed: 02/09/2016 3:51:22 PM By: Evlyn Kanner MD, FACS Entered By: Evlyn Kanner on 02/09/2016 15:51:21 Musleh, Willson NMarland Kitchen ([id number]) -------------------------------------------------------------------------------- Physician Orders Details Patient Name: [name] Rad. Date of Service: 02/09/2016 2:15 PM Medical Record Number: [id number] Patient Account Number: [id number] Date of Birth/Sex: [birth year] (81 y.o. Male) Treating RN: Phillis Haggis Primary Care Physician: Oval Linsey Other Clinician: Referring Physician: Alona Bene Treating Physician/Extender: Rudene Re in Treatment: 0 Verbal / Phone Orders: Yes ClinicianAshok Cordia, Debi Read Back and Verified: Yes Diagnosis Coding ICD-10 Coding Code Description E11.622 Type 2 diabetes mellitus with other skin ulcer I89.0 Lymphedema, not elsewhere classified I50.42 Chronic combined systolic (congestive) and diastolic (congestive) heart failure L97.221 Non-pressure chronic ulcer of left calf limited to breakdown of skin Wound Cleansing Wound #1 Left,Lateral Lower Leg o Clean wound with wound  cleanser. o Cleanse wound with mild soap and water o May shower with protection. o No tub bath. Wound #2 Left,Proximal,Anterior Lower Leg o Clean wound with wound cleanser. o Cleanse wound with mild soap and water o May shower with protection. o No tub bath. Wound #3 Left,Distal,Anterior Lower Leg o Clean wound with wound cleanser. o Cleanse wound with mild soap and water o May shower with protection. o No tub bath. Anesthetic Wound #1 Left,Lateral Lower Leg o Topical Lidocaine 4% cream applied to wound bed prior to debridement Wound #2 Left,Proximal,Anterior Lower Leg o Topical Lidocaine 4% cream applied to wound bed prior to debridement Wound #3 Left,Distal,Anterior Lower Leg o Topical Lidocaine 4% cream applied to wound bed prior to debridement Tarte, Jomel N. ([id number]) Primary Wound Dressing Wound #1 Left,Lateral Lower Leg o Sorbalgon Ag Wound #2 Left,Proximal,Anterior Lower Leg o Sorbalgon Ag Wound #3 Left,Distal,Anterior Lower Leg o Sorbalgon Ag Secondary Dressing Wound #1 Left,Lateral Lower Leg o ABD pad o XtraSorb Wound #2 Left,Proximal,Anterior Lower Leg o ABD pad o XtraSorb Wound #3 Left,Distal,Anterior Lower Leg o ABD pad o XtraSorb Dressing Change Frequency Wound #1 Left,Lateral Lower Leg o Change dressing every week Wound #2 Left,Proximal,Anterior Lower Leg o Change dressing every week Wound #3 Left,Distal,Anterior Lower Leg o Change dressing every week Follow-up Appointments Wound #1 Left,Lateral Lower Leg o Return Appointment in 1 week. Wound #2 Left,Proximal,Anterior Lower Leg o Return Appointment in 1 week. Wound #3 Left,Distal,Anterior Lower Leg o Return Appointment in 1 week. Edema Control Wound #1 Left,Lateral Lower Leg o Elevate legs to the level of the heart and pump ankles as often as possible o Other: - kerlix and coban unna to anchor Level, Maverik N. ([id number]) Wound #2  Left,Proximal,Anterior Lower Leg o Elevate legs to the level of the heart and pump ankles as often as possible o Other: - kerlix and coban unna anchor Wound #3 Left,Distal,Anterior Lower Leg o Elevate legs to the level of the heart and pump ankles as often as possible o Other: - kerlix and coban Electronic Signature(s) Signed: 02/09/2016 4:03:12 PM By: Evlyn Kanner MD, FACS Signed: 02/09/2016 5:03:25 PM By: Alejandro Mulling Entered By: Alejandro Mulling on 02/09/2016 15:50:46 Clere, Airen N. ([id number]) -------------------------------------------------------------------------------- Problem List Details Patient Name: [name] Hudson, Giankarlo N. Date of Service: 02/09/2016 2:15 PM Medical Record Number: [id number] Patient Account Number: [id number] Date of Birth/Sex: 01-May-1934 (81 y.o. Male) Treating RN: Phillis Haggis Primary Care Physician: Oval Linsey Other Clinician: Referring Physician: Alona Bene Treating Physician/Extender: Rudene Re in Treatment: 0 Active Problems ICD-10 Encounter Code Description Active Date Diagnosis E11.622 Type  2 diabetes mellitus with other skin ulcer 02/09/2016 Yes I89.0 Lymphedema, not elsewhere classified 02/09/2016 Yes I50.42 Chronic combined systolic (congestive) and diastolic 02/09/2016 Yes (congestive) heart failure L97.221 Non-pressure chronic ulcer of left calf limited to 02/09/2016 Yes breakdown of skin Inactive Problems Resolved Problems Electronic Signature(s) Signed: 02/09/2016 3:46:33 PM By: Evlyn Kanner MD, FACS Entered By: Evlyn Kanner on 02/09/2016 15:46:33 Kerwood, Makaio N. ([id number]) -------------------------------------------------------------------------------- Progress Note Details Patient Name: [name] Hudson, Blane N. Date of Service: 02/09/2016 2:15 PM Medical Record Number: [id number] Patient Account Number: [id number] Date of Birth/Sex: 05-05-1934 (81 y.o. Male) Treating RN: Phillis Haggis Primary Care Physician:  Oval Linsey Other Clinician: Referring Physician: Alona Bene Treating Physician/Extender: Rudene Re in Treatment: 0 Subjective Chief Complaint Information obtained from Patient Patient presents to the wound care center for a consult due non healing wound to the left lower extremity with bilateral lower extremity swelling History of Present Illness (HPI) The following HPI elements were documented for the patient's wound: Location: bilateral lower extremity swelling with ulcerations on the left calf Quality: Patient reports experiencing a dull pain to affected area(s). Severity: Patient states wound are getting worse. Duration: Patient has had the wound for > 3 months prior to seeking treatment at the wound center Timing: Pain in wound is Intermittent (comes and goes Context: The wound appeared gradually over time Modifying Factors: Other treatment(s) tried include:diuretics and is on multiple cardiac medications Associated Signs and Symptoms: Patient reports having increase swelling. 81 year old gentleman with multiple comorbidities as extensive lymphedema both lower extremities due to chronic combined systolic and diastolic heart failure. is been noncompliant with his diuretics and every now and then has ulcerations and weeping from his left lower extremity. Past medical history of diabetes mellitus type 2 which has been associated with hyperglycemia, atrial fibrillation, coronary artery disease, CHF, cardiomyopathy, subcoracoid hemorrhage, subdural hematoma, ureterolithiasis, chronic anticoagulation with Coumadin. he is status post cardiac catheterization, defibrillator placement, cholecystectomy, angioplasty of coronary vessels with stent, cystoscopy and retrograde ureteroscope E, knee arthroplasty, echocardiography of the heart. Wound History Patient presents with 3 open wounds that have been present for approximately 4 weeks. Patient has been treating wounds in the  following manner: neosporin. Laboratory tests have not been performed in the last month. Patient reportedly has not tested positive for an antibiotic resistant organism. Patient reportedly has not tested positive for osteomyelitis. Patient reportedly has not had testing performed to evaluate circulation in the legs. Patient experiences the following problems associated with their wounds: swelling. Patient History Information obtained from Patient. Allergies Novocain Whiters, Rosendo N. ([id number]) Family History Cancer - Child, Diabetes - Siblings, Heart Disease - Father, Mother, Siblings, Thyroid Problems - Child, No family history of Hereditary Spherocytosis, Hypertension, Kidney Disease, Lung Disease, Seizures, Stroke, Tuberculosis. Social History Former smoker - quit 1994 smoke since pt was 20 yrs ago, Marital Status - Married, Alcohol Use - Never - hx---quit 1984, Drug Use - No History, Caffeine Use - Daily. Medical History Eyes Patient has history of Cataracts - surgery Respiratory Patient has history of Chronic Obstructive Pulmonary Disease (COPD), Sleep Apnea Cardiovascular Patient has history of Arrhythmia - a-flutter, a-fib, Congestive Heart Failure, Myocardial Infarction Endocrine Patient has history of Type II Diabetes - 4 years Musculoskeletal Patient has history of Gout Blood sugar is not tested. Review of Systems (ROS) Constitutional Symptoms (General Health) The patient has no complaints or symptoms. Ear/Nose/Mouth/Throat The patient has no complaints or symptoms. Hematologic/Lymphatic chronic anticoagulation brain aneurysm Cardiovascular at risk for sudden cardiac death hyperlipidemia ICD ischemic  cardiomyopathy NICM Gastrointestinal The patient has no complaints or symptoms. Genitourinary hx kidney stones Immunological The patient has no complaints or symptoms. Integumentary (Skin) The patient has no complaints or symptoms. Musculoskeletal degenerative joint  disease Neurologic The patient has no complaints or symptoms. Oncologic The patient has no complaints or symptoms. Psychiatric The patient has no complaints or symptoms. Ovando, Lewie N. ([id number]) Objective Constitutional Pulse regular. Respirations normal and unlabored. Afebrile. Vitals Time Taken: 2:30 PM, Height: 64 in, Source: Stated, Weight: 162 lbs, Source: Stated, BMI: 27.8, Temperature: 97.6 F, Pulse: 114 bpm, Respiratory Rate: 18 breaths/min, Blood Pressure: 113/77 mmHg. General Notes: Made Dr. Meyer Russel aware of pts HR. Eyes Nonicteric. Reactive to light. Ears, Nose, Mouth, and Throat Lips, teeth, and gums WNL.Marland Kitchen Moist mucosa without lesions. Neck supple and nontender. No palpable supraclavicular or cervical adenopathy. Normal sized without goiter. Respiratory WNL. No retractions.. Breath sounds WNL, No rubs, rales, rhonchi, or wheeze.. Cardiovascular Pedal Pulses WNL. left ABI was noncompressible and right was 1.09. stage II lymphedema bilaterally. Chest Breasts symmetical and no nipple discharge.. Breast tissue WNL, no masses, lumps, or tenderness.. Lymphatic No adneopathy. No adenopathy. No adenopathy. Musculoskeletal Adexa without tenderness or enlargement.. Digits and nails w/o clubbing, cyanosis, infection, petechiae, ischemia, or inflammatory conditions.Marland Kitchen Psychiatric Judgement and insight Intact.. No evidence of depression, anxiety, or agitation.. General Notes: besides the massive lymphedema both lower extremities he's got ulceration on the left lateral calf which are superficial in nature and minimal debris washed out with moist saline gauze. No sharp dissection was required. Integumentary (Hair, Skin) Salais, Jerian N. ([id number]) No suspicious lesions. No crepitus or fluctuance. No peri-wound warmth or erythema. No masses.. Wound #1 status is Open. Original cause of wound was Blister. The wound is located on the Left,Lateral Lower Leg. The wound measures 1.8cm  length x 0.3cm width x 0.1cm depth; 0.424cm^2 area and 0.042cm^3 volume. The wound is limited to skin breakdown. There is no tunneling or undermining noted. There is a large amount of serous drainage noted. The wound margin is distinct with the outline attached to the wound base. There is no granulation within the wound bed. There is a large (67-100%) amount of necrotic tissue within the wound bed including Eschar. The periwound skin appearance exhibited: Localized Edema, Moist, Erythema. The surrounding wound skin color is noted with erythema which is circumferential. Periwound temperature was noted as No Abnormality. Wound #2 status is Open. Original cause of wound was Blister. The wound is located on the Left,Proximal,Anterior Lower Leg. The wound measures 1.6cm length x 0.4cm width x 0.1cm depth; 0.503cm^2 area and 0.05cm^3 volume. The wound is limited to skin breakdown. There is no tunneling or undermining noted. There is a large amount of serous drainage noted. The wound margin is distinct with the outline attached to the wound base. There is no granulation within the wound bed. There is a large (67- 100%) amount of necrotic tissue within the wound bed including Eschar. The periwound skin appearance exhibited: Localized Edema, Moist. Periwound temperature was noted as No Abnormality. The periwound has tenderness on palpation. Wound #3 status is Open. Original cause of wound was Blister. The wound is located on the [facility] Lower Leg. The wound measures 1cm length x 1cm width x 0.1cm depth; 0.785cm^2 area and 0.079cm^3 volume. The wound is limited to skin breakdown. There is no tunneling or undermining noted. There is a large amount of serous drainage noted. The wound margin is distinct with the outline attached to the wound base. There is large (  67-100%) red granulation within the wound bed. There is a small (1-33%) amount of necrotic tissue within the wound bed including  Adherent Slough. The periwound skin appearance exhibited: Localized Edema, Moist, Erythema. The surrounding wound skin color is noted with erythema which is circumferential. Periwound temperature was noted as No Abnormality. The periwound has tenderness on palpation. Assessment Active Problems ICD-10 E11.622 - Type 2 diabetes mellitus with other skin ulcer I89.0 - Lymphedema, not elsewhere classified I50.42 - Chronic combined systolic (congestive) and diastolic (congestive) heart failure L97.221 - Non-pressure chronic ulcer of left calf limited to breakdown of skin this 81 year old diabetic who has multiple comorbidities including atrial fibrillation, combined diastolic and systolic congestive heart failure comes with massive lymphedema of both lower extremities with ulceration Mcgloin, Laurance N. ([id number]) on the left side. He is not a candidate for workup of his venous system arterial system and would like symptomatic treatment. I have recommended : 1. elevation and exercise 2. Silver alginate and a 2 layer light compression to his left lower extremity 3. Appropriate use of his diuretics and fluid restriction to be done as per his cardiologist or PCP 4. Regular visits to the wound center 5. Not a candidate for lymphedema pumps due to his cardiac pathology Plan Wound Cleansing: Wound #1 Left,Lateral Lower Leg: Clean wound with wound cleanser. Cleanse wound with mild soap and water May shower with protection. No tub bath. Wound #2 Left,Proximal,Anterior Lower Leg: Clean wound with wound cleanser. Cleanse wound with mild soap and water May shower with protection. No tub bath. Wound #3 Left,Distal,Anterior Lower Leg: Clean wound with wound cleanser. Cleanse wound with mild soap and water May shower with protection. No tub bath. Anesthetic: Wound #1 Left,Lateral Lower Leg: Topical Lidocaine 4% cream applied to wound bed prior to debridement Wound #2 Left,Proximal,Anterior Lower  Leg: Topical Lidocaine 4% cream applied to wound bed prior to debridement Wound #3 Left,Distal,Anterior Lower Leg: Topical Lidocaine 4% cream applied to wound bed prior to debridement Primary Wound Dressing: Wound #1 Left,Lateral Lower Leg: Sorbalgon Ag Wound #2 Left,Proximal,Anterior Lower Leg: Sorbalgon Ag Wound #3 Left,Distal,Anterior Lower Leg: Sorbalgon Ag Secondary Dressing: Wound #1 Left,Lateral Lower Leg: ABD pad XtraSorb Wound #2 Left,Proximal,Anterior Lower Leg: ABD pad Deliah Boston, Janard N. ([id number]) Wound #3 Left,Distal,Anterior Lower Leg: ABD pad XtraSorb Dressing Change Frequency: Wound #1 Left,Lateral Lower Leg: Change dressing every week Wound #2 Left,Proximal,Anterior Lower Leg: Change dressing every week Wound #3 Left,Distal,Anterior Lower Leg: Change dressing every week Follow-up Appointments: Wound #1 Left,Lateral Lower Leg: Return Appointment in 1 week. Wound #2 Left,Proximal,Anterior Lower Leg: Return Appointment in 1 week. Wound #3 Left,Distal,Anterior Lower Leg: Return Appointment in 1 week. Edema Control: Wound #1 Left,Lateral Lower Leg: Elevate legs to the level of the heart and pump ankles as often as possible Other: - kerlix and coban unna to anchor Wound #2 Left,Proximal,Anterior Lower Leg: Elevate legs to the level of the heart and pump ankles as often as possible Other: - kerlix and coban unna anchor Wound #3 Left,Distal,Anterior Lower Leg: Elevate legs to the level of the heart and pump ankles as often as possible Other: - kerlix and coban this 81 year old diabetic who has multiple comorbidities including atrial fibrillation, combined diastolic and systolic congestive heart failure comes with massive lymphedema of both lower extremities with ulceration on the left side. He is not a candidate for workup of his venous system arterial system and would like symptomatic treatment. I have recommended : 1. elevation and exercise 2.  Silver alginate and a  2 layer light compression to his left lower extremity 3. Appropriate use of his diuretics and fluid restriction to be done as per his cardiologist or PCP 4. Regular visits to the wound center 5. Not a candidate for lymphedema pumps due to his cardiac pathology Electronic Signature(s) Signed: 02/09/2016 3:54:18 PM By: Evlyn Kanner MD, FACS Entered By: Evlyn Kanner on 02/09/2016 15:54:18 Brisbane, Akeel N. ([id number]) Carcione, Lyndol NMarland Kitchen ([id number]) -------------------------------------------------------------------------------- ROS/PFSH Details Patient Name: [name] Hudson, Sachin N. Date of Service: 02/09/2016 2:15 PM Medical Record Number: [id number] Patient Account Number: [id number] Date of Birth/Sex: November 29, 1934 (81 y.o. Male) Treating RN: Phillis Haggis Primary Care Physician: Oval Linsey Other Clinician: Referring Physician: Alona Bene Treating Physician/Extender: Rudene Re in Treatment: 0 Information Obtained From Patient Wound History Do you currently have one or more open woundso Yes How many open wounds do you currently haveo 3 Approximately how long have you had your woundso 4 weeks How have you been treating your wound(s) until nowo neosporin Has your wound(s) ever healed and then re-openedo No Have you had any lab work done in the past montho No Have you tested positive for an antibiotic resistant organism (MRSA, VRE)o No Have you tested positive for osteomyelitis (bone infection)o No Have you had any tests for circulation on your legso No Have you had other problems associated with your woundso Swelling Constitutional Symptoms (General Health) Complaints and Symptoms: No Complaints or Symptoms Eyes Medical History: Positive for: Cataracts - surgery Ear/Nose/Mouth/Throat Complaints and Symptoms: No Complaints or Symptoms Hematologic/Lymphatic Complaints and Symptoms: Review of System Notes: chronic anticoagulation brain  aneurysm Respiratory Medical History: Positive for: Chronic Obstructive Pulmonary Disease (COPD); Sleep Apnea Cardiovascular Klimas, Jarrius N. ([id number]) Complaints and Symptoms: Review of System Notes: at risk for sudden cardiac death hyperlipidemia ICD ischemic cardiomyopathy NICM Medical History: Positive for: Arrhythmia - a-flutter, a-fib; Congestive Heart Failure; Myocardial Infarction Gastrointestinal Complaints and Symptoms: No Complaints or Symptoms Endocrine Medical History: Positive for: Type II Diabetes - 4 years Blood sugar tested every day: No Genitourinary Complaints and Symptoms: Review of System Notes: hx kidney stones Immunological Complaints and Symptoms: No Complaints or Symptoms Integumentary (Skin) Complaints and Symptoms: No Complaints or Symptoms Musculoskeletal Complaints and Symptoms: Review of System Notes: degenerative joint disease Medical History: Positive for: Gout Neurologic Barreras, Damondre N. ([id number]) Complaints and Symptoms: No Complaints or Symptoms Oncologic Complaints and Symptoms: No Complaints or Symptoms Psychiatric Complaints and Symptoms: No Complaints or Symptoms HBO Extended History Items Eyes: Cataracts Immunizations Pneumococcal Vaccine: Received Pneumococcal Vaccination: No Family and Social History Cancer: Yes - Child; Diabetes: Yes - Siblings; Heart Disease: Yes - Father, Mother, Siblings; Hereditary Spherocytosis: No; Hypertension: No; Kidney Disease: No; Lung Disease: No; Seizures: No; Stroke: No; Thyroid Problems: Yes - Child; Tuberculosis: No; Former smoker - quit 1994 smoke since pt was 20 yrs ago; Marital Status - Married; Alcohol Use: Never - hx---quit 1984; Drug Use: No History; Caffeine Use: Daily; Financial Concerns: No; Food, Clothing or Shelter Needs: No; Support System Lacking: No; Transportation Concerns: No; Advanced Directives: No; Patient does not want information on Advanced Directives; Do not  resuscitate: No; Living Will: No; Medical Power of Attorney: No Physician Affirmation I have reviewed and agree with the above information. Electronic Signature(s) Signed: 02/09/2016 4:03:12 PM By: Evlyn Kanner MD, FACS Signed: 02/09/2016 5:03:25 PM By: Alejandro Mulling Entered By: Evlyn Kanner on 02/09/2016 15:51:39 Goetze, Bracen N. ([id number]) -------------------------------------------------------------------------------- SuperBill Details Patient Name: [name] Hudson, Kohen N. Date of Service: 02/09/2016 Medical Record Number: [id number] Patient Account Number: [id number]  Date of Birth/Sex: [birth year] (80 y.o. 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 Facility Procedures CPT4 Code: 16109604 Description: 54098 - WOUND CARE VISIT-LEV 5 EST PT Modifier: Quantity: 1 Physician Procedures CPT4: Description Modifier Quantity Code [id number] 99204 - WC PHYS LEVEL 4 - NEW PT 1 ICD-10 Description Diagnosis E11.622 Type 2 diabetes mellitus with other skin ulcer I89.0 Lymphedema, not elsewhere classified I50.42 Chronic combined systolic  (congestive) and diastolic (congestive) heart failure L97.221 Non-pressure chronic ulcer of left calf limited to breakdown of skin Electronic Signature(s) Signed: 02/09/2016 5:03:25 PM By: Alejandro Mulling Previous Signature: 02/09/2016 3:54:33 PM Version By: Evlyn Kanner MD, FACS Entered By: Alejandro Mulling on 02/09/2016 16:48:54

## 2016-02-10 NOTE — Progress Notes (Signed)
Hau, Sanor Jawaan Delane Ginger ([id number]) Visit Report for 02/09/2016 Allergy List Details Patient Name: [name], [name]. Date of Service: 02/09/2016 2:15 PM Medical Record Number: [id number] Patient Account Number: [id number] Date of Birth/Sex: March 19, 1935 (81 y.o. Male) Treating RN: Ahmed Prima Primary Care Physician: Lucia Gaskins Other Clinician: Referring Physician: Nanda Quinton Treating Physician/Extender: Frann Rider in Treatment: 0 Allergies Active Allergies Novocain Allergy Notes Electronic Signature(s) Signed: 02/09/2016 5:03:25 PM By: Alric Quan Entered By: Alric Quan on 02/09/2016 14:37:46 Newcomb, [name] ([id number]) -------------------------------------------------------------------------------- Arrival Information Details Patient Name: [name]. Date of Service: 02/09/2016 2:15 PM Medical Record Number: [id number] Patient Account Number: [id number] Date of Birth/Sex: 06/25/1934 (81 y.o. Male) Treating RN: Ahmed Prima Primary Care Physician: Lucia Gaskins Other Clinician: Referring Physician: Nanda Quinton Treating Physician/Extender: Frann Rider in Treatment: 0 Visit Information Patient Arrived: Ambulatory Arrival Time: 14:27 Accompanied By: wife Transfer Assistance: None Patient Identification Verified: Yes Secondary Verification Process Yes Completed: Patient Requires Transmission- No Based Precautions: Patient Has Alerts: Yes Patient Alerts: DM II L LEG Non- Compressible Electronic Signature(s) Signed: 02/09/2016 5:03:25 PM By: Alric Quan Entered By: Alric Quan on 02/09/2016 15:03:09 [place], [name] Part. ([id number]) -------------------------------------------------------------------------------- Clinic Level of Care Assessment Details Patient Name: [name], [name]. Date of Service: 02/09/2016 2:15 PM Medical Record Number: [id number] Patient Account Number: [id number] Date of Birth/Sex: 1934/11/01 (81 y.o. Male) Treating  RN: Ahmed Prima Primary Care Physician: Lucia Gaskins Other Clinician: Referring Physician: Nanda Quinton Treating Physician/Extender: Frann Rider in Treatment: 0 Clinic Level of Care Assessment Items TOOL 2 Quantity Score X - Use when only an EandM is performed on the INITIAL visit 1 0 ASSESSMENTS - Nursing Assessment / Reassessment X - General Physical Exam (combine w/ comprehensive assessment (listed just 1 20 below) when performed on new pt. evals) X - Comprehensive Assessment (HX, ROS, Risk Assessments, Wounds Hx, etc.) 1 25 ASSESSMENTS - Wound and Skin Assessment / Reassessment '[]'$  - Simple Wound Assessment / Reassessment - one wound 0 X - Complex Wound Assessment / Reassessment - multiple wounds 3 5 '[]'$  - Dermatologic / Skin Assessment (not related to wound area) 0 ASSESSMENTS - Ostomy and/or Continence Assessment and Care '[]'$  - Incontinence Assessment and Management 0 '[]'$  - Ostomy Care Assessment and Management (repouching, etc.) 0 PROCESS - Coordination of Care '[]'$  - Simple Patient / Family Education for ongoing care 0 X - Complex (extensive) Patient / Family Education for ongoing care 1 20 X - Staff obtains Programmer, systems, Records, Test Results / Process Orders 1 10 '[]'$  - Staff telephones HHA, Nursing Homes / Clarify orders / etc 0 '[]'$  - Routine Transfer to another Facility (non-emergent condition) 0 '[]'$  - Routine Hospital Admission (non-emergent condition) 0 X - New Admissions / Biomedical engineer / Ordering NPWT, Apligraf, etc. 1 15 '[]'$  - Emergency Hospital Admission (emergent condition) 0 X - Simple Discharge Coordination 1 10 [name], [name] N. ([id number]) '[]'$  - Complex (extensive) Discharge Coordination 0 PROCESS - Special Needs '[]'$  - Pediatric / Minor Patient Management 0 '[]'$  - Isolation Patient Management 0 '[]'$  - Hearing / Language / Visual special needs 0 '[]'$  - Assessment of Community assistance (transportation, D/C planning, etc.) 0 '[]'$  - Additional assistance /  Altered mentation 0 '[]'$  - Support Surface(s) Assessment (bed, cushion, seat, etc.) 0 INTERVENTIONS - Wound Cleansing / Measurement X - Wound Imaging (photographs - any number of wounds) 1 5 '[]'$  - Wound Tracing (instead of photographs) 0 '[]'$  - Simple Wound Measurement - one wound 0 X - Complex Wound Measurement -  multiple wounds 3 5 '[]'$  - Simple Wound Cleansing - one wound 0 X - Complex Wound Cleansing - multiple wounds 3 5 INTERVENTIONS - Wound Dressings '[]'$  - Small Wound Dressing one or multiple wounds 0 '[]'$  - Medium Wound Dressing one or multiple wounds 0 X - Large Wound Dressing one or multiple wounds 1 20 '[]'$  - Application of Medications - injection 0 INTERVENTIONS - Miscellaneous '[]'$  - External ear exam 0 '[]'$  - Specimen Collection (cultures, biopsies, blood, body fluids, etc.) 0 '[]'$  - Specimen(s) / Culture(s) sent or taken to Lab for analysis 0 '[]'$  - Patient Transfer (multiple staff / Harrel Lemon Lift / Similar devices) 0 '[]'$  - Simple Staple / Suture removal (25 or less) 0 '[]'$  - Complex Staple / Suture removal (26 or more) 0 [name], [name] N. ([id number]) '[]'$  - Hypo / Hyperglycemic Management (close monitor of Blood Glucose) 0 X - Ankle / Brachial Index (ABI) - do not check if billed separately 1 15 Has the patient been seen at the hospital within the last three years: Yes Total Score: 185 Level Of Care: New/Established - Level 5 Electronic Signature(s) Signed: 02/09/2016 5:03:25 PM By: Alric Quan Entered By: Alric Quan on 02/09/2016 16:48:38 [name], [name] N. ([id number]) -------------------------------------------------------------------------------- Encounter Discharge Information Details Patient Name: [name], [name] Date of Service: 02/09/2016 2:15 PM Medical Record Number: [id number] Patient Account Number: [id number] Date of Birth/Sex: Aug 24, 1934 (81 y.o. Male) Treating RN: Ahmed Prima Primary Care Physician: Lucia Gaskins Other Clinician: Referring Physician: Nanda Quinton Treating Physician/Extender: Frann Rider in Treatment: 0 Encounter Discharge Information Items Discharge Pain Level: 0 Discharge Condition: Stable Ambulatory Status: Ambulatory Discharge Destination: Home Transportation: Private Auto Accompanied By: wife Schedule Follow-up Appointment: Yes Medication Reconciliation completed and provided to Patient/Care No Vernessa Likes: Provided on Clinical Summary of Care: 02/09/2016 Form Type Recipient Paper Patient DM Electronic Signature(s) Signed: 02/09/2016 3:52:17 PM By: Ruthine Dose Entered By: Ruthine Dose on 02/09/2016 15:52:17 Bauman, Ozro N. ([id number]) -------------------------------------------------------------------------------- Lower Extremity Assessment Details Patient Name: [name], Corwyn N. Date of Service: 02/09/2016 2:15 PM Medical Record Number: [id number] Patient Account Number: [id number] Date of Birth/Sex: May 29, 1934 (81 y.o. Male) Treating RN: Ahmed Prima Primary Care Physician: Lucia Gaskins Other Clinician: Referring Physician: Nanda Quinton Treating Physician/Extender: Frann Rider in Treatment: 0 Edema Assessment Assessed: [Left: No] [Right: No] Edema: [Left: Yes] [Right: Yes] Calf Left: Right: Point of Measurement: 28 cm From Medial Instep 39.5 cm 38.8 cm Ankle Left: Right: Point of Measurement: 10 cm From Medial Instep 26 cm 26 cm Vascular Assessment Pulses: Posterior Tibial Dorsalis Pedis Palpable: [Left:No] [Right:No] Doppler: [Left:Monophasic] [Right:Monophasic] Extremity colors, hair growth, and conditions: Extremity Color: [Left:Normal] [Right:Hyperpigmented] Temperature of Extremity: [Left:Warm] [Right:Warm] Capillary Refill: [Left:< 3 seconds] Blood Pressure: Brachial: [ZDGUY:403] Dorsalis Pedis: [Left:Dorsalis Pedis: 474] Ankle: Posterior Tibial: [Left:Posterior Tibial: 259] [Right:1.09] Toe Nail Assessment Left: Right: Thick: Yes Yes Discolored: Yes  Yes Deformed: No No Improper Length and Hygiene: Yes Yes Notes left leg non-compressible [name], [name] N. ([id number]) Electronic Signature(s) Signed: 02/09/2016 5:03:25 PM By: Alric Quan Entered By: Alric Quan on 02/09/2016 15:06:06 [name], [name] N. ([id number]) -------------------------------------------------------------------------------- Multi Wound Chart Details Patient Name: [name]. Date of Service: 02/09/2016 2:15 PM Medical Record Number: [id number] Patient Account Number: [id number] Date of Birth/Sex: [birth year] (81 y.o. Male) Treating RN: Ahmed Prima Primary Care Physician: Lucia Gaskins Other Clinician: Referring Physician: Nanda Quinton Treating Physician/Extender: Frann Rider in Treatment: 0 Vital Signs Height(in): 64 Pulse(bpm): 114 Weight(lbs): 162 Blood Pressure 113/77 (mmHg): Body Mass Index(BMI): 28 Temperature(F): 97.6  Respiratory Rate 18 (breaths/min): Photos: [1:No Photos] [2:No Photos] [3:No Photos] Wound Location: [1:Left Lower Leg - Lateral] [2:Left Lower Leg - Anterior, Proximal] [3:Left Lower Leg - Anterior, Distal] Wounding Event: [1:Blister] [2:Blister] [3:Blister] Primary Etiology: [1:To be determined] [2:To be determined] [3:To be determined] Date Acquired: [1:01/12/2016] [2:01/12/2016] [3:01/12/2016] Weeks of Treatment: [1:0] [2:0] [3:0] Wound Status: [1:Open] [2:Open] [3:Open] Measurements L x W x D 1.8x0.3x0.1 [2:1.6x0.4x0.1] [3:1x1x0.1] (cm) Area (cm) : [1:0.424] [2:0.503] [3:0.785] Volume (cm) : [1:0.042] [2:0.05] [3:0.079] % Reduction in Area: [1:0.00%] [2:N/A] [3:N/A] % Reduction in Volume: 0.00% [2:N/A] [3:N/A] Classification: [1:Partial Thickness] [2:Partial Thickness] [3:Partial Thickness] Exudate Amount: [1:Large] [2:Large] [3:Large] Exudate Type: [1:Serous] [2:Serous] [3:Serous] Exudate Color: [1:amber] [2:amber] [3:amber] Wound Margin: [1:Distinct, outline attached] [2:Distinct, outline attached]  [3:Distinct, outline attached] Granulation Amount: [1:None Present (0%)] [2:None Present (0%)] [3:Large (67-100%)] Granulation Quality: [1:N/A] [2:N/A] [3:Red] Necrotic Amount: [1:Large (67-100%)] [2:Large (67-100%)] [3:Small (1-33%)] Necrotic Tissue: [1:Eschar] [2:Eschar] [3:Adherent Slough] Exposed Structures: [1:Fascia: No Fat: No Tendon: No Muscle: No Joint: No Bone: No] [2:Fascia: No Fat: No Tendon: No Muscle: No Joint: No Bone: No] [3:Fascia: No Fat: No Tendon: No Muscle: No Joint: No Bone: No] Limited to Skin Limited to Skin Limited to Skin Breakdown Breakdown Breakdown Epithelialization: None None None Periwound Skin Texture: Edema: Yes Edema: Yes Edema: Yes Periwound Skin Moist: Yes Moist: Yes Moist: Yes Moisture: Periwound Skin Color: Erythema: Yes No Abnormalities Noted Erythema: Yes Erythema Location: Circumferential N/A Circumferential Temperature: No Abnormality No Abnormality No Abnormality Tenderness on No Yes Yes Palpation: Wound Preparation: Ulcer Cleansing: Ulcer Cleansing: Ulcer Cleansing: Rinsed/Irrigated with Rinsed/Irrigated with Rinsed/Irrigated with Saline Saline Saline Topical Anesthetic Topical Anesthetic Topical Anesthetic Applied: Other: lidocaine Applied: Other: lidocaine Applied: Other: lidocaine 4% 4% 4% Treatment Notes Electronic Signature(s) Signed: 02/09/2016 5:03:25 PM By: Alric Quan Entered By: Alric Quan on 02/09/2016 15:36:51 [name], [place]. ([id number]) -------------------------------------------------------------------------------- [place] Details Patient Name: [name]. Date of Service: 02/09/2016 2:15 PM Medical Record Number: [id number] Patient Account Number: [id number] Date of Birth/Sex: 09-Nov-1934 (81 y.o. Male) Treating RN: Ahmed Prima Primary Care Physician: Lucia Gaskins Other Clinician: Referring Physician: Nanda Quinton Treating Physician/Extender: Frann Rider in Treatment:  0 Active Inactive Abuse / Safety / Falls / Self Care Management Nursing Diagnoses: Potential for falls Goals: Patient will remain injury free Date Initiated: 02/09/2016 Goal Status: Active Interventions: Assess fall risk on admission and as needed Assess impairment of mobility on admission and as needed per policy Notes: Nutrition Nursing Diagnoses: Imbalanced nutrition Impaired glucose control: actual or potential Goals: Patient/caregiver agrees to and verbalizes understanding of need to use nutritional supplements and/or vitamins as prescribed Date Initiated: 02/09/2016 Goal Status: Active Patient/caregiver will maintain therapeutic glucose control Date Initiated: 02/09/2016 Goal Status: Active Interventions: Assess patient nutrition upon admission and as needed per policy Provide education on elevated blood sugars and impact on wound healing Notes: [place], [place]. ([id number]) Orientation to the Wound Care Program Nursing Diagnoses: Knowledge deficit related to the wound healing center program Goals: Patient/caregiver will verbalize understanding of the [place] Program Date Initiated: 02/09/2016 Goal Status: Active Interventions: Provide education on orientation to the wound center Notes: Pain, Acute or Chronic Nursing Diagnoses: Pain, acute or chronic: actual or potential Potential alteration in comfort, pain Goals: Patient will verbalize adequate pain control and receive pain control interventions during procedures as needed Date Initiated: 02/09/2016 Goal Status: Active Patient/caregiver will verbalize adequate pain control between visits Date Initiated: 02/09/2016 Goal Status: Active Patient/caregiver will verbalize comfort level met Date  Initiated: 02/09/2016 Goal Status: Active Interventions: Assess comfort goal upon admission Complete pain assessment as per visit requirements Notes: Wound/Skin Impairment Nursing Diagnoses: Impaired tissue  integrity Goals: Ulcer/skin breakdown will have a volume reduction of 30% by week 4 Date Initiated: 02/09/2016 Viviani, Aidden Dorris Carnes ([id number]) Goal Status: Active Ulcer/skin breakdown will have a volume reduction of 50% by week 8 Date Initiated: 02/09/2016 Goal Status: Active Ulcer/skin breakdown will have a volume reduction of 80% by week 12 Date Initiated: 02/09/2016 Goal Status: Active Interventions: Assess patient/caregiver ability to perform ulcer/skin care regimen upon admission and as needed Assess ulceration(s) every visit Notes: Electronic Signature(s) Signed: 02/09/2016 5:03:25 PM By: Alejandro Mulling Entered By: Alejandro Mulling on 02/09/2016 15:36:26 Gorin, Jermel N. ([id number]) -------------------------------------------------------------------------------- Pain Assessment Details Patient Name: [name] Rad. Date of Service: 02/09/2016 2:15 PM Medical Record Number: [id number] Patient Account Number: [id number] Date of Birth/Sex: [birth year] (81 y.o. Male) Treating RN: Phillis Haggis Primary Care Physician: Oval Linsey Other Clinician: Referring Physician: Alona Bene Treating Physician/Extender: Rudene Re in Treatment: 0 Active Problems Location of Pain Severity and Description of Pain Patient Has Paino Yes Site Locations Pain Location: Pain in Ulcers With Dressing Change: Yes Rate the pain. Current Pain Level: 8 Character of Pain Describe the Pain: Burning Pain Management and Medication Current Pain Management: Electronic Signature(s) Signed: 02/09/2016 5:03:25 PM By: Alejandro Mulling Entered By: Alejandro Mulling on 02/09/2016 14:30:03 [name], [name] N. ([id number]) -------------------------------------------------------------------------------- Patient/Caregiver Education Details Patient Name: [name] Rad. Date of Service: 02/09/2016 2:15 PM Medical Record Number: [id number] Patient Account Number: [id number] Date of Birth/Gender: 12-21-1934 (81  y.o. Male) Treating RN: Phillis Haggis Primary Care Physician: Oval Linsey Other Clinician: Referring Physician: Alona Bene Treating Physician/Extender: Rudene Re in Treatment: 0 Education Assessment Education Provided To: Patient Education Topics Provided Elevated Blood Sugar/ Impact on Healing: Handouts: Elevated Blood Sugars: How Do They Affect Wound Healing Methods: Explain/Verbal Responses: State content correctly Welcome To The Wound Care Center: Handouts: Welcome To The [facility] Methods: Explain/Verbal Responses: State content correctly Wound/Skin Impairment: Handouts: Other: do not get wrap wet Methods: Demonstration, Explain/Verbal Responses: State content correctly Electronic Signature(s) Signed: 02/09/2016 5:03:25 PM By: Alejandro Mulling Entered By: Alejandro Mulling on 02/09/2016 15:51:50 [name], [name] N. ([id number]) -------------------------------------------------------------------------------- Wound Assessment Details Patient Name: [name], [name] N. Date of Service: 02/09/2016 2:15 PM Medical Record Number: [id number] Patient Account Number: [id number] Date of Birth/Sex: May 29, 1934 (81 y.o. Male) Treating RN: Phillis Haggis Primary Care Physician: Oval Linsey Other Clinician: Referring Physician: Alona Bene Treating Physician/Extender: Rudene Re in Treatment: 0 Wound Status Wound Number: 1 Primary Etiology: To be determined Wound Location: Left Lower Leg - Lateral Wound Status: Open Wounding Event: Blister Date Acquired: 01/12/2016 Weeks Of Treatment: 0 Clustered Wound: No Photos Photo Uploaded By: Alejandro Mulling on 02/09/2016 16:52:51 Wound Measurements Length: (cm) 1.8 Width: (cm) 0.3 Depth: (cm) 0.1 Area: (cm) 0.424 Volume: (cm) 0.042 % Reduction in Area: 0% % Reduction in Volume: 0% Epithelialization: None Tunneling: No Undermining: No Wound Description Classification: Partial Thickness Wound  Margin: Distinct, outline attached Exudate Amount: Large Exudate Type: Serous Exudate Color: amber Foul Odor After Cleansing: No Wound Bed Granulation Amount: None Present (0%) Exposed Structure Necrotic Amount: Large (67-100%) Fascia Exposed: No Necrotic Quality: Eschar Fat Layer Exposed: No Tendon Exposed: No Byrum, Parley N. ([id number]) Muscle Exposed: No Joint Exposed: No Bone Exposed: No Limited to Skin Breakdown Periwound Skin Texture Texture Color No Abnormalities Noted: No No Abnormalities Noted: No Localized Edema: Yes Erythema: Yes Erythema Location:  Circumferential Moisture No Abnormalities Noted: No Temperature / Pain Moist: Yes Temperature: No Abnormality Wound Preparation Ulcer Cleansing: Rinsed/Irrigated with Saline Topical Anesthetic Applied: Other: lidocaine 4%, Treatment Notes Wound #1 (Left, Lateral Lower Leg) 1. Cleansed with: Clean wound with Normal Saline 2. Anesthetic Topical Lidocaine 4% cream to wound bed prior to debridement 5. Secondary Dressing Applied ABD Pad Notes xtrasorb, silver alginate, kerlix and coban with unna to anchor Electronic Signature(s) Signed: 02/09/2016 5:03:25 PM By: Alric Quan Entered By: Alric Quan on 02/09/2016 15:15:46 [place], [place]ass ([id number]) -------------------------------------------------------------------------------- Wound Assessment Details Patient Name: [name], Brennon N. Date of Service: 02/09/2016 2:15 PM Medical Record Number: [id number] Patient Account Number: [id number] Date of Birth/Sex: [birth year] (81 y.o. Male) Treating RN: Ahmed Prima Primary Care Physician: Lucia Gaskins Other Clinician: Referring Physician: Nanda Quinton Treating Physician/Extender: Frann Rider in Treatment: 0 Wound Status Wound Number: 2 Primary Etiology: To be determined Wound Location: Left Lower Leg - Anterior, Wound Status: Open Proximal Wounding Event: Blister Date Acquired: 01/12/2016 Weeks  Of Treatment: 0 Clustered Wound: No Photos Photo Uploaded By: Alric Quan on 02/09/2016 16:53:19 Wound Measurements Length: (cm) 1.6 Width: (cm) 0.4 Depth: (cm) 0.1 Area: (cm) 0.503 Volume: (cm) 0.05 % Reduction in Area: % Reduction in Volume: Epithelialization: None Tunneling: No Undermining: No Wound Description Classification: Partial Thickness Wound Margin: Distinct, outline attached Exudate Amount: Large Exudate Type: Serous Exudate Color: amber Foul Odor After Cleansing: No Wound Bed Granulation Amount: None Present (0%) Exposed Structure Necrotic Amount: Large (67-100%) Fascia Exposed: No Necrotic Quality: Eschar Fat Layer Exposed: No Cartelli, Kendryck N. ([id number]) Tendon Exposed: No Muscle Exposed: No Joint Exposed: No Bone Exposed: No Limited to Skin Breakdown Periwound Skin Texture Texture Color No Abnormalities Noted: No No Abnormalities Noted: No Localized Edema: Yes Temperature / Pain Moisture Temperature: No Abnormality No Abnormalities Noted: No Tenderness on Palpation: Yes Moist: Yes Wound Preparation Ulcer Cleansing: Rinsed/Irrigated with Saline Topical Anesthetic Applied: Other: lidocaine 4%, Treatment Notes Wound #2 (Left, Proximal, Anterior Lower Leg) 1. Cleansed with: Clean wound with Normal Saline 2. Anesthetic Topical Lidocaine 4% cream to wound bed prior to debridement 5. Secondary Dressing Applied ABD Pad Notes xtrasorb, silver alginate, kerlix and coban with unna to anchor Electronic Signature(s) Signed: 02/09/2016 5:03:25 PM By: Alric Quan Entered By: Alric Quan on 02/09/2016 15:12:10 Gudiel, Onis N. ([id number]) -------------------------------------------------------------------------------- Wound Assessment Details Patient Name: [name], Jove N. Date of Service: 02/09/2016 2:15 PM Medical Record Number: [id number] Patient Account Number: [id number] Date of Birth/Sex: November 11, 1934 (81 y.o. Male) Treating RN:  Ahmed Prima Primary Care Physician: Lucia Gaskins Other Clinician: Referring Physician: Nanda Quinton Treating Physician/Extender: Frann Rider in Treatment: 0 Wound Status Wound Number: 3 Primary Etiology: To be determined Wound Location: Left Lower Leg - Anterior, Distal Wound Status: Open Wounding Event: Blister Date Acquired: 01/12/2016 Weeks Of Treatment: 0 Clustered Wound: No Wound Measurements Length: (cm) 1 Width: (cm) 1 Depth: (cm) 0.1 Area: (cm) 0.785 Volume: (cm) 0.079 % Reduction in Area: % Reduction in Volume: Epithelialization: None Tunneling: No Undermining: No Wound Description Classification: Partial Thickness Wound Margin: Distinct, outline attached Exudate Amount: Large Exudate Type: Serous Exudate Color: amber Foul Odor After Cleansing: No Wound Bed Granulation Amount: Large (67-100%) Exposed Structure Granulation Quality: Red Fascia Exposed: No Necrotic Amount: Small (1-33%) Fat Layer Exposed: No Necrotic Quality: Adherent Slough Tendon Exposed: No Muscle Exposed: No Joint Exposed: No Bone Exposed: No Limited to Skin Breakdown Periwound Skin Texture Texture Color No Abnormalities Noted: No No Abnormalities Noted: No Localized Edema: Yes  Erythema: Yes Erythema Location: Circumferential Moisture No Abnormalities Noted: No Temperature / Pain Moist: Yes Temperature: No Abnormality Dutta, Eliyas N. ([id number]) Tenderness on Palpation: Yes Wound Preparation Ulcer Cleansing: Rinsed/Irrigated with Saline Topical Anesthetic Applied: Other: lidocaine 4%, Treatment Notes Wound #3 (Left, Distal, Anterior Lower Leg) 1. Cleansed with: Clean wound with Normal Saline 2. Anesthetic Topical Lidocaine 4% cream to wound bed prior to debridement 5. Secondary Dressing Applied ABD Pad Notes xtrasorb, silver alginate, kerlix and coban with unna to anchor Electronic Signature(s) Signed: 02/09/2016 5:03:25 PM By: Alric Quan Entered By: Alric Quan on 02/09/2016 15:14:45 Dorvil, Donye N. ([id number]) -------------------------------------------------------------------------------- Hand Details Patient Name: [name], Greogory N. Date of Service: 02/09/2016 2:15 PM Medical Record Number: [id number] Patient Account Number: [id number] Date of Birth/Sex: 1934/12/16 (81 y.o. Male) Treating RN: Ahmed Prima Primary Care Physician: Lucia Gaskins Other Clinician: Referring Physician: Nanda Quinton Treating Physician/Extender: Frann Rider in Treatment: 0 Vital Signs Time Taken: 14:30 Temperature (F): 97.6 Height (in): 64 Pulse (bpm): 114 Source: Stated Respiratory Rate (breaths/min): 18 Weight (lbs): 162 Blood Pressure (mmHg): 113/77 Source: Stated Reference Range: 80 - 120 mg / dl Body Mass Index (BMI): 27.8 Notes Made Dr. Con Memos aware of pts HR. Electronic Signature(s) Signed: 02/09/2016 5:03:25 PM By: Alric Quan Entered By: Alric Quan on 02/09/2016 14:32:47

## 2016-02-16 ENCOUNTER — Encounter: Payer: Medicare HMO | Admitting: Surgery

## 2016-02-16 DIAGNOSIS — E11622 Type 2 diabetes mellitus with other skin ulcer: Secondary | ICD-10-CM | POA: Diagnosis not present

## 2016-02-17 NOTE — Progress Notes (Signed)
[name], [name] ([id number]) Visit Report for 02/16/2016 Arrival Information Details Patient Name: [name], [name]. Date of Service: 02/16/2016 3:00 PM Medical Record Number: [id number] Patient Account Number: [id number] Date of Birth/Sex: 08-Aug-1934 (81 y.o. Male) Treating RN: Clover Mealy, RN, BSN, [place] Sink Primary Care Physician: Oval Linsey Other Clinician: Referring Physician: Oval Linsey Treating Physician/Extender: Rudene Re in Treatment: 1 Visit Information History Since Last Visit All ordered tests and consults were completed: No Patient Arrived: Ambulatory Added or deleted any medications: No Arrival Time: 14:31 Any new allergies or adverse reactions: No Accompanied By: wife Had a fall or experienced change in No Transfer Assistance: None activities of daily living that may affect Patient Identification Verified: Yes risk of falls: Secondary Verification Process Yes Signs or symptoms of abuse/neglect since last No Completed: visito Patient Requires Transmission- No Hospitalized since last visit: No Based Precautions: Has Dressing in Place as Prescribed: Yes Patient Has Alerts: Yes Pain Present Now: No Patient Alerts: DM II L LEG Non- Compressible Electronic Signature(s) Signed: 02/16/2016 3:23:03 PM By: Elpidio Eric BSN, RN Entered By: Elpidio Eric on 02/16/2016 14:31:51 [name]. ([id number]) -------------------------------------------------------------------------------- Clinic Level of Care Assessment Details Patient Name: [name]. Date of Service: 02/16/2016 3:00 PM Medical Record Number: [id number] Patient Account Number: [id number] Date of Birth/Sex: 09-24-1934 (81 y.o. Male) Treating RN: Clover Mealy, RN, BSN, Rita Primary Care Physician: Oval Linsey Other Clinician: Referring Physician: Oval Linsey Treating Physician/Extender: Rudene Re in Treatment: 1 Clinic Level of Care Assessment Items TOOL 4 Quantity Score []  -  Use when only an EandM is performed on FOLLOW-UP visit 0 ASSESSMENTS - Nursing Assessment / Reassessment X - Reassessment of Co-morbidities (includes updates in patient status) 1 10 X - Reassessment of Adherence to Treatment Plan 1 5 ASSESSMENTS - Wound and Skin Assessment / Reassessment X - Simple Wound Assessment / Reassessment - one wound 1 5 []  - Complex Wound Assessment / Reassessment - multiple wounds 0 []  - Dermatologic / Skin Assessment (not related to wound area) 0 ASSESSMENTS - Focused Assessment []  - Circumferential Edema Measurements - multi extremities 0 []  - Nutritional Assessment / Counseling / Intervention 0 []  - Lower Extremity Assessment (monofilament, tuning fork, pulses) 0 []  - Peripheral Arterial Disease Assessment (using hand held doppler) 0 ASSESSMENTS - Ostomy and/or Continence Assessment and Care []  - Incontinence Assessment and Management 0 []  - Ostomy Care Assessment and Management (repouching, etc.) 0 PROCESS - Coordination of Care X - Simple Patient / Family Education for ongoing care 1 15 []  - Complex (extensive) Patient / Family Education for ongoing care 0 []  - Haematologist obtains Chiropractor, Records, Test Results / Process Orders 0 []  - Staff telephones HHA, Nursing Homes / Clarify orders / etc 0 []  - Routine Transfer to another Facility (non-emergent condition) 0 [name], [name]. ([id number]) []  - Routine Hospital Admission (non-emergent condition) 0 []  - New Admissions / Manufacturing engineer / Ordering NPWT, Apligraf, etc. 0 []  - Emergency Hospital Admission (emergent condition) 0 []  - Simple Discharge Coordination 0 []  - Complex (extensive) Discharge Coordination 0 PROCESS - Special Needs []  - Pediatric / Minor Patient Management 0 []  - Isolation Patient Management 0 []  - Hearing / Language / Visual special needs 0 []  - Assessment of Community assistance (transportation, D/C planning, etc.) 0 []  - Additional assistance / Altered mentation 0 []  - Support  Surface(s) Assessment (bed, cushion, seat, etc.) 0 INTERVENTIONS - Wound Cleansing / Measurement X - Simple Wound Cleansing - one wound 1 5 []  -  Complex Wound Cleansing - multiple wounds 0 X - Wound Imaging (photographs - any number of wounds) 1 5 []  - Wound Tracing (instead of photographs) 0 X - Simple Wound Measurement - one wound 1 5 []  - Complex Wound Measurement - multiple wounds 0 INTERVENTIONS - Wound Dressings X - Small Wound Dressing one or multiple wounds 1 10 []  - Medium Wound Dressing one or multiple wounds 0 []  - Large Wound Dressing one or multiple wounds 0 []  - Application of Medications - topical 0 []  - Application of Medications - injection 0 INTERVENTIONS - Miscellaneous []  - External ear exam 0 [name], [name]. ([id number]) []  - Specimen Collection (cultures, biopsies, blood, body fluids, etc.) 0 []  - Specimen(s) / Culture(s) sent or taken to Lab for analysis 0 []  - Patient Transfer (multiple staff / Michiel Sites Lift / Similar devices) 0 []  - Simple Staple / Suture removal (25 or less) 0 []  - Complex Staple / Suture removal (26 or more) 0 []  - Hypo / Hyperglycemic Management (close monitor of Blood Glucose) 0 []  - Ankle / Brachial Index (ABI) - do not check if billed separately 0 X - Vital Signs 1 5 Has the patient been seen at the hospital within the last three years: Yes Total Score: 65 Level Of Care: New/Established - Level 2 Electronic Signature(s) Signed: 02/16/2016 3:23:03 PM By: Elpidio Eric BSN, RN Entered By: Elpidio Eric on 02/16/2016 15:11:32 [name], [name]. ([id number]) -------------------------------------------------------------------------------- Encounter Discharge Information Details Patient Name: [name]. Date of Service: 02/16/2016 3:00 PM Medical Record Number: [id number] Patient Account Number: [id number] Date of Birth/Sex: [birth year] (81 y.o. Male) Treating RN: Clover Mealy, RN, BSN, [place] Sink Primary Care Physician: Oval Linsey Other  Clinician: Referring Physician: Oval Linsey Treating Physician/Extender: Rudene Re in Treatment: 1 Encounter Discharge Information Items Discharge Pain Level: 0 Discharge Condition: Stable Ambulatory Status: Ambulatory Discharge Destination: Home Private Transportation: Auto Accompanied By: wife Schedule Follow-up Appointment: No Medication Reconciliation completed and No provided to Patient/Care Crystian Frith: Clinical Summary of Care: Electronic Signature(s) Signed: 02/16/2016 3:12:34 PM By: Elpidio Eric BSN, RN Previous Signature: 02/16/2016 3:12:25 PM Version By: Gwenlyn Perking Entered By: Elpidio Eric on 02/16/2016 15:12:34 [name], [name]. ([id number]) -------------------------------------------------------------------------------- Lower Extremity Assessment Details Patient Name: [name]. Date of Service: 02/16/2016 3:00 PM Medical Record Number: [id number] Patient Account Number: [id number] Date of Birth/Sex: Oct 10, 1934 (81 y.o. Male) Treating RN: Clover Mealy, RN, BSN, [place] Sink Primary Care Physician: Oval Linsey Other Clinician: Referring Physician: Oval Linsey Treating Physician/Extender: Rudene Re in Treatment: 1 Edema Assessment Assessed: [Left: No] [Right: No] Edema: [Left: Ye] [Right: s] Calf Left: Right: Point of Measurement: 28 cm From Medial Instep 3.4 cm cm Ankle Left: Right: Point of Measurement: 10 cm From Medial Instep 25.6 cm cm Vascular Assessment Claudication: Claudication Assessment [Left:None] Pulses: Posterior Tibial Dorsalis Pedis Palpable: [Left:Yes] Extremity colors, hair growth, and conditions: Extremity Color: [Left:Mottled] Hair Growth on Extremity: [Left:No] Temperature of Extremity: [Left:Warm] Capillary Refill: [Left:< 3 seconds] Electronic Signature(s) Signed: 02/16/2016 3:23:03 PM By: Elpidio Eric BSN, RN Entered By: Elpidio Eric on 02/16/2016 14:35:05 [name], [name].  ([id number]) -------------------------------------------------------------------------------- Multi Wound Chart Details Patient Name: [name]. Date of Service: 02/16/2016 3:00 PM Medical Record Number: [id number] Patient Account Number: [id number] Date of Birth/Sex: 27-May-1934 (81 y.o. Male) Treating RN: Clover Mealy, RN, BSN, [place] Sink Primary Care Physician: Oval Linsey Other Clinician: Referring Physician: Oval Linsey Treating Physician/Extender: Rudene Re in Treatment: 1 Vital Signs Height(in): 64 Pulse(bpm): 62 Weight(lbs): 162 Blood Pressure 90/42 (mmHg): Body  Mass Index(BMI): 28 Temperature(F): 97.6 Respiratory Rate 17 (breaths/min): Photos: [1:No Photos] [2:No Photos] [3:No Photos] Wound Location: [1:Left Lower Leg - Lateral Left Lower Leg - Anterior,] [2:Proximal] [3:Left Lower Leg - Anterior, Distal] Wounding Event: [1:Blister] [2:Blister] [3:Blister] Primary Etiology: [1:Venous Leg Ulcer] [2:Venous Leg Ulcer] [3:Venous Leg Ulcer] Comorbid History: [1:Cataracts, Chronic Obstructive Pulmonary Disease (COPD), Sleep Disease (COPD), Sleep Apnea, Arrhythmia, Congestive Heart Failure, Congestive Heart Failure, Myocardial Infarction, Type II Diabetes, Gout] [2:Cataracts, Chronic  Obstructive Pulmonary Apnea, Arrhythmia, Myocardial Infarction, Type II Diabetes, Gout] [3:Cataracts, Chronic Obstructive Pulmonary Disease (COPD), Sleep Apnea, Arrhythmia, Congestive Heart Failure, Myocardial Infarction, Type II Diabetes, Gout] Date Acquired: [1:01/12/2016] [2:01/12/2016] [3:01/12/2016] Weeks of Treatment: [1:1] [2:1] [3:1] Wound Status: [1:Healed - Epithelialized] [2:Healed - Epithelialized] [3:Healed - Epithelialized] Measurements L x W x D 0x0x0 [2:0x0x0] [3:0x0x0] (cm) Area (cm) : [1:0] [2:0] [3:0] Volume (cm) : [1:0] [2:0] [3:0] % Reduction in Area: [1:100.00%] [2:100.00%] [3:100.00%] % Reduction in Volume: 100.00% [2:100.00%] [3:100.00%] Classification:  [1:Partial Thickness] [2:Partial Thickness] [3:Partial Thickness] HBO Classification: [1:Grade 0] [2:Grade 0] [3:Grade 0] Exudate Amount: [1:Large] [2:None Present] [3:None Present] Exudate Type: [1:Serous] [2:[name]/A] [3:[name]/A] Exudate Color: [1:amber] [2:[name]/A] [3:[name]/A] Wound Margin: [1:Distinct, outline attached Distinct, outline attached] [3:Distinct, outline attached] Granulation Amount: [1:None Present (0%)] [2:None Present (0%)] [3:None Present (0%)] Necrotic Amount: [1:None Present (0%)] [2:None Present (0%)] [3:None Present (0%)] Exposed Structures: Coval, Cuyler [name]. ([id number]) Fascia: No Fascia: No Fascia: No Fat: No Fat: No Fat: No Tendon: No Tendon: No Tendon: No Muscle: No Muscle: No Muscle: No Joint: No Joint: No Joint: No Bone: No Bone: No Bone: No Limited to Skin Limited to Skin Limited to Skin Breakdown Breakdown Breakdown Epithelialization: Large (67-100%) Large (67-100%) None Periwound Skin Texture: Edema: No Edema: No Edema: No Excoriation: No Excoriation: No Excoriation: No Induration: No Induration: No Induration: No Callus: No Callus: No Callus: No Crepitus: No Crepitus: No Crepitus: No Fluctuance: No Fluctuance: No Fluctuance: No Friable: No Friable: No Friable: No Rash: No Rash: No Rash: No Scarring: No Scarring: No Scarring: No Periwound Skin Maceration: No Dry/Scaly: Yes Dry/Scaly: Yes Moisture: Moist: No Maceration: No Maceration: No Dry/Scaly: No Moist: No Moist: No Periwound Skin Color: Atrophie Blanche: No Atrophie Blanche: No Atrophie Blanche: No Cyanosis: No Cyanosis: No Cyanosis: No Ecchymosis: No Ecchymosis: No Ecchymosis: No Erythema: No Erythema: No Erythema: No Hemosiderin Staining: No Hemosiderin Staining: No Hemosiderin Staining: No Mottled: No Mottled: No Mottled: No Pallor: No Pallor: No Pallor: No Rubor: No Rubor: No Rubor: No Temperature: No Abnormality No Abnormality No Abnormality Tenderness on  No No No Palpation: Wound Preparation: Ulcer Cleansing: Ulcer Cleansing: Other: Ulcer Cleansing: Rinsed/Irrigated with water and soap Rinsed/Irrigated with Saline, Other: WAter and Saline, Other: water and Soap Topical Anesthetic soap Applied: None Topical Anesthetic Applied: None Treatment Notes Electronic Signature(s) Signed: 02/16/2016 3:10:30 PM By: Elpidio Eric BSN, RN Entered By: Elpidio Eric on 02/16/2016 15:10:30 [name], [name] ([id number]) -------------------------------------------------------------------------------- Multi-Disciplinary Care Plan Details Patient Name: [name]. Date of Service: 02/16/2016 3:00 PM Medical Record Number: [id number] Patient Account Number: [id number] Date of Birth/Sex: July 15, 1934 (81 y.o. Male) Treating RN: Clover Mealy, RN, BSN, [place] Sink Primary Care Physician: Oval Linsey Other Clinician: Referring Physician: Oval Linsey Treating Physician/Extender: Rudene Re in Treatment: 1 Active Inactive Electronic Signature(s) Signed: 02/16/2016 3:08:38 PM By: Elpidio Eric BSN, RN Entered By: Elpidio Eric on 02/16/2016 15:08:38 [name], [name] Kitchen ([id number]) -------------------------------------------------------------------------------- Pain Assessment Details Patient Name: [name]. Date of Service: 02/16/2016 3:00 PM Medical Record Number: [id number] Patient Account  Number: [id number] Date of Birth/Sex: 1934-09-25 (81 y.o. Male) Treating RN: Clover Mealy, RN, BSN, [place] Sink Primary Care Physician: Oval Linsey Other Clinician: Referring Physician: Oval Linsey Treating Physician/Extender: Rudene Re in Treatment: 1 Active Problems Location of Pain Severity and Description of Pain Patient Has Paino No Site Locations With Dressing Change: No Pain Management and Medication Current Pain Management: Electronic Signature(s) Signed: 02/16/2016 3:23:03 PM By: Elpidio Eric BSN, RN Entered By: Elpidio Eric on 02/16/2016  14:32:02 Guse, [place] Dorris [name] ([id number]) -------------------------------------------------------------------------------- Patient/Caregiver Education Details Patient Name: [name]. Date of Service: 02/16/2016 3:00 PM Medical Record Number: [id number] Patient Account Number: [id number] Date of Birth/Gender: December 14, 1934 (81 y.o. Male) Treating RN: Clover Mealy, RN, BSN, [place] Sink Primary Care Physician: Oval Linsey Other Clinician: Referring Physician: Oval Linsey Treating Physician/Extender: Rudene Re in Treatment: 1 Education Assessment Education Provided To: Patient Education Topics Provided Electronic Signature(s) Signed: 02/16/2016 3:23:03 PM By: Elpidio Eric BSN, RN Entered By: Elpidio Eric on 02/16/2016 15:12:47 [name], [name] Kitchen ([id number]) -------------------------------------------------------------------------------- Wound Assessment Details Patient Name: [name] [name]. Date of Service: 02/16/2016 3:00 PM Medical Record Number: [id number] Patient Account Number: [id number] Date of Birth/Sex: 07-06-1934 (81 y.o. Male) Treating RN: Clover Mealy, RN, BSN, Rita Primary Care Physician: Oval Linsey Other Clinician: Referring Physician: Oval Linsey Treating Physician/Extender: Rudene Re in Treatment: 1 Wound Status Wound Number: 1 Primary Venous Leg Ulcer Etiology: Wound Location: Left Lower Leg - Lateral Wound Healed - Epithelialized Wounding Event: Blister Status: Date Acquired: 01/12/2016 Comorbid Cataracts, Chronic Obstructive Weeks Of Treatment: 1 History: Pulmonary Disease (COPD), Sleep Clustered Wound: No Apnea, Arrhythmia, Congestive Heart Failure, Myocardial Infarction, Type II Diabetes, Gout Photos Photo Uploaded By: Elpidio Eric on 02/16/2016 15:21:37 Wound Measurements Length: (cm) 0 % Redu Width: (cm) 0 % Redu Depth: (cm) 0 Epithe Area: (cm) 0 Tunne Volume: (cm) 0 Under ction in Area: 100% ction in Volume: 100% lialization:  Large (67-100%) ling: No mining: No Wound Description Classification: Partial Thickness Diabetic Severity [address]): Grade 0 Wound Margin: Distinct, outline attache Exudate Amount: Large Exudate Type: Serous Exudate Color: amber Foul Odor After Cleansing: No d Wound Bed Granulation Amount: None Present (0%) Exposed Structure Musolino, Danyon [name]. ([id number]) Necrotic Amount: None Present (0%) Fascia Exposed: No Fat Layer Exposed: No Tendon Exposed: No Muscle Exposed: No Joint Exposed: No Bone Exposed: No Limited to Skin Breakdown Periwound Skin Texture Texture Color No Abnormalities Noted: No No Abnormalities Noted: No Callus: No Atrophie Blanche: No Crepitus: No Cyanosis: No Excoriation: No Ecchymosis: No Fluctuance: No Erythema: No Friable: No Hemosiderin Staining: No Induration: No Mottled: No Localized Edema: No Pallor: No Rash: No Rubor: No Scarring: No Temperature / Pain Moisture Temperature: No Abnormality No Abnormalities Noted: No Dry / Scaly: No Maceration: No Moist: No Wound Preparation Ulcer Cleansing: Rinsed/Irrigated with Saline, Other: WAter and Soap, Electronic Signature(s) Signed: 02/16/2016 3:23:03 PM By: Elpidio Eric BSN, RN Entered By: Elpidio Eric on 02/16/2016 15:06:37 [name], [name]. ([id number]) -------------------------------------------------------------------------------- Wound Assessment Details Patient Name: [name], Nathanael [name]. Date of Service: 02/16/2016 3:00 PM Medical Record Number: [id number] Patient Account Number: [id number] Date of Birth/Sex: 1934-04-15 (81 y.o. Male) Treating RN: Clover Mealy, RN, BSN, [place] Sink Primary Care Physician: Oval Linsey Other Clinician: Referring Physician: Oval Linsey Treating Physician/Extender: Rudene Re in Treatment: 1 Wound Status Wound Number: 2 Primary Venous Leg Ulcer Etiology: Wound Location: Left Lower Leg - Anterior, Proximal Wound Healed - Epithelialized Status: Wounding  Event: Blister Comorbid Cataracts, Chronic Obstructive Date Acquired: 01/12/2016 History: Pulmonary Disease (COPD), Sleep Weeks Of Treatment: 1  Apnea, Arrhythmia, Congestive Heart Clustered Wound: No Failure, Myocardial Infarction, Type II Diabetes, Gout Photos Photo Uploaded By: Elpidio Eric on 02/16/2016 15:21:54 Wound Measurements Length: (cm) 0 % Reduction Width: (cm) 0 % Reduction Depth: (cm) 0 Epithelializ Area: (cm) 0 Tunneling: Volume: (cm) 0 Undermining in Area: 100% in Volume: 100% ation: Large (67-100%) No : No Wound Description Classification: Partial Thickness Diabetic Severity [address]): Grade 0 Hommes, Brendin [name]. ([id number]) Foul Odor After Cleansing: No Wound Margin: Distinct, outline attached Exudate Amount: None Present Wound Bed Granulation Amount: None Present (0%) Exposed Structure Necrotic Amount: None Present (0%) Fascia Exposed: No Fat Layer Exposed: No Tendon Exposed: No Muscle Exposed: No Joint Exposed: No Bone Exposed: No Limited to Skin Breakdown Periwound Skin Texture Texture Color No Abnormalities Noted: No No Abnormalities Noted: No Callus: No Atrophie Blanche: No Crepitus: No Cyanosis: No Excoriation: No Ecchymosis: No Fluctuance: No Erythema: No Friable: No Hemosiderin Staining: No Induration: No Mottled: No Localized Edema: No Pallor: No Rash: No Rubor: No Scarring: No Temperature / Pain Moisture Temperature: No Abnormality No Abnormalities Noted: No Dry / Scaly: Yes Maceration: No Moist: No Wound Preparation Ulcer Cleansing: Other: water and soap, Topical Anesthetic Applied: None Electronic Signature(s) Signed: 02/16/2016 3:23:03 PM By: Elpidio Eric BSN, RN Entered By: Elpidio Eric on 02/16/2016 15:09:31 Baskette, Tanish [name]. ([id number]) -------------------------------------------------------------------------------- Wound Assessment Details Patient Name: [name], Judah [name]. Date of Service: 02/16/2016 3:00 PM Medical  Record Number: [id number] Patient Account Number: [id number] Date of Birth/Sex: [birth year] (81 y.o. Male) Treating RN: Clover Mealy, RN, BSN, Rita Primary Care Physician: Oval Linsey Other Clinician: Referring Physician: Oval Linsey Treating Physician/Extender: Rudene Re in Treatment: 1 Wound Status Wound Number: 3 Primary Venous Leg Ulcer Etiology: Wound Location: Left Lower Leg - Anterior, Distal Wound Healed - Epithelialized Status: Wounding Event: Blister Comorbid Cataracts, Chronic Obstructive Date Acquired: 01/12/2016 History: Pulmonary Disease (COPD), Sleep Weeks Of Treatment: 1 Apnea, Arrhythmia, Congestive Heart Clustered Wound: No Failure, Myocardial Infarction, Type II Diabetes, Gout Photos Photo Uploaded By: Elpidio Eric on 02/16/2016 15:22:08 Wound Measurements Length: (cm) 0 % Reduction Width: (cm) 0 % Reduction Depth: (cm) 0 Epithelializ Area: (cm) 0 Tunneling: Volume: (cm) 0 Undermining in Area: 100% in Volume: 100% ation: None No : No Wound Description Classification: Partial Thickness Diabetic Severity [address]): Grade 0 Selway, Rea [name]. ([id number]) Foul Odor After Cleansing: No Wound Margin: Distinct, outline attached Exudate Amount: None Present Wound Bed Granulation Amount: None Present (0%) Exposed Structure Necrotic Amount: None Present (0%) Fascia Exposed: No Fat Layer Exposed: No Tendon Exposed: No Muscle Exposed: No Joint Exposed: No Bone Exposed: No Limited to Skin Breakdown Periwound Skin Texture Texture Color No Abnormalities Noted: No No Abnormalities Noted: No Callus: No Atrophie Blanche: No Crepitus: No Cyanosis: No Excoriation: No Ecchymosis: No Fluctuance: No Erythema: No Friable: No Hemosiderin Staining: No Induration: No Mottled: No Localized Edema: No Pallor: No Rash: No Rubor: No Scarring: No Temperature / Pain Moisture Temperature: No Abnormality No Abnormalities Noted: No Dry / Scaly:  Yes Maceration: No Moist: No Wound Preparation Ulcer Cleansing: Rinsed/Irrigated with Saline, Other: water and soap, Topical Anesthetic Applied: None Electronic Signature(s) Signed: 02/16/2016 3:23:03 PM By: Elpidio Eric BSN, RN Entered By: Elpidio Eric on 02/16/2016 15:08:02 Barot, Almus [name]. ([id number]) -------------------------------------------------------------------------------- Vitals Details Patient Name: [name], Skyler [name]. Date of Service: 02/16/2016 3:00 PM Medical Record Number: [id number] Patient Account Number: [id number] Date of Birth/Sex: 1934-11-25 (81 y.o. Male) Treating RN: Clover Mealy, RN, BSN, [place] Sink Primary Care Physician: Oval Linsey Other Clinician: Referring Physician: DONDIEGO,  [name] Treating Physician/Extender: Rudene ReBritto, Errol Weeks in Treatment: 1 Vital Signs Time Taken: 14:32 Temperature (F): 97.6 Height (in): 64 Pulse (bpm): 62 Weight (lbs): 162 Respiratory Rate (breaths/min): 17 Body Mass Index (BMI): 27.8 Blood Pressure (mmHg): 90/42 Reference Range: 80 - 120 mg / dl Notes MD made aware. Per patient is normal for him Electronic Signature(s) Signed: 02/16/2016 3:23:03 PM By: Elpidio EricAfful, Rita BSN, RN Entered By: Elpidio EricAfful, Rita on 02/16/2016 14:35:40

## 2016-02-17 NOTE — Progress Notes (Signed)
[name], [name] Dorris CarnesN. ([id number]) Visit Report for 02/16/2016 Chief Complaint Document Details Patient Name: [name], [name]. Date of Service: 02/16/2016 3:00 PM Medical Record Number: [id number] Patient Account Number: [id number] Date of Birth/Sex: September 27, 1934 5(81 y.o. Male) Treating RN: Clover MealyAfful, RN, BSN, [place] Sinkita Primary Care Physician: Oval LinseyNDIEGO, [name] Other Clinician: Referring Physician: Oval LinseyNDIEGO, [name] Treating Physician/Extender: Rudene ReBritto, Jeanita Carneiro Weeks in Treatment: 1 Information Obtained from: Patient Chief Complaint Patient presents to the wound care center for a consult due non healing wound to the left lower extremity with bilateral lower extremity swelling Electronic Signature(s) Signed: 02/16/2016 3:11:04 PM By: Evlyn KannerBritto, Shylee Durrett MD, FACS Entered By: Evlyn KannerBritto, Aislee Landgren on 02/16/2016 15:11:03 Lorman, Quan [name]. ([id number]) -------------------------------------------------------------------------------- HPI Details Patient Name: [name], [name]. Date of Service: 02/16/2016 3:00 PM Medical Record Number: [id number] Patient Account Number: [id number] Date of Birth/Sex: September 27, 1934 86(81 y.o. Male) Treating RN: Clover MealyAfful, RN, BSN, [place]ink[place] Primary Care Physician: Oval LinseyNDIEGO, [name] Other Clinician: Referring Physician: Oval LinseyNDIEGO, [name] Treating Physician/Extender: Rudene ReBritto, Latif Nazareno Weeks in Treatment: 1 History of Present Illness Location: bilateral lower extremity swelling with ulcerations on the left calf Quality: Patient reports experiencing a dull pain to affected area(s). Severity: Patient states wound are getting worse. Duration: Patient has had the wound for > 3 months prior to seeking treatment at the wound center Timing: Pain in wound is Intermittent (comes and goes Context: The wound appeared gradually over time Modifying Factors: Other treatment(s) tried include:diuretics and is on multiple cardiac medications Associated Signs and Symptoms: Patient reports having increase swelling. HPI Description:  81 year old gentleman with multiple comorbidities as extensive lymphedema both lower extremities due to chronic combined systolic and diastolic heart failure. is been noncompliant with his diuretics and every now and then has ulcerations and weeping from his left lower extremity. Past medical history of diabetes mellitus type 2 which has been associated with hyperglycemia, atrial fibrillation, coronary artery disease, CHF, cardiomyopathy, subcoracoid hemorrhage, subdural hematoma, ureterolithiasis, chronic anticoagulation with Coumadin. he is status post cardiac catheterization, defibrillator placement, cholecystectomy, angioplasty of coronary vessels with stent, cystoscopy and retrograde ureteroscope E, knee arthroplasty, echocardiography of the heart. Electronic Signature(s) Signed: 02/16/2016 3:11:08 PM By: Evlyn KannerBritto, Jerimyah Vandunk MD, FACS Entered By: Evlyn KannerBritto, Daanya Lanphier on 02/16/2016 15:11:08 Melka, Jerimey NMarland Kitchen. ([id number]) -------------------------------------------------------------------------------- Physical Exam Details Patient Name: [name], [name]. Date of Service: 02/16/2016 3:00 PM Medical Record Number: [id number] Patient Account Number: [id number] Date of Birth/Sex: September 27, 1934 68(81 y.o. Male) Treating RN: Clover MealyAfful, RN, BSN, [place] Sinkita Primary Care Physician: Oval LinseyNDIEGO, [name] Other Clinician: Referring Physician: Oval LinseyNDIEGO, [name] Treating Physician/Extender: Rudene ReBritto, Haani Bakula Weeks in Treatment: 1 Constitutional . Pulse regular. Respirations normal and unlabored. Afebrile. . Eyes Nonicteric. Reactive to light. Ears, Nose, Mouth, and Throat Lips, teeth, and gums WNL.Marland Kitchen. Moist mucosa without lesions. Neck supple and nontender. No palpable supraclavicular or cervical adenopathy. Normal sized without goiter. Respiratory WNL. No retractions.. Breath sounds WNL, No rubs, rales, rhonchi, or wheeze.. Cardiovascular Heart rhythm and rate regular, no murmur or gallop.. Pedal Pulses WNL. No clubbing, cyanosis or  edema. Lymphatic No adneopathy. No adenopathy. No adenopathy. Musculoskeletal Adexa without tenderness or enlargement.. Digits and nails w/o clubbing, cyanosis, infection, petechiae, ischemia, or inflammatory conditions.. Integumentary (Hair, Skin) No suspicious lesions. No crepitus or fluctuance. No peri-wound warmth or erythema. No masses.Marland Kitchen. Psychiatric Judgement and insight Intact.. No evidence of depression, anxiety, or agitation.. Notes his lymphedema has gone down significantly and the ulceration on the lateral part of his left calf has completely healed. Electronic Signature(s) Signed: 02/16/2016 3:11:29 PM By: Evlyn KannerBritto, Rashi Giuliani MD, FACS Entered By: Evlyn KannerBritto, Dalan Cowger on 02/16/2016 15:11:29  [name], [name] NMarland Kitchen ([id number]) -------------------------------------------------------------------------------- Physician Orders Details Patient Name: [name], [name]. Date of Service: 02/16/2016 3:00 PM Medical Record Number: [id number] Patient Account Number: [id number] Date of Birth/Sex: 1934/06/30 (81 y.o. Male) Treating RN: Clover Mealy, RN, BSN, [place] Sink Primary Care Physician: Oval Linsey Other Clinician: Referring Physician: Oval Linsey Treating Physician/Extender: Rudene Re in Treatment: 1 Verbal / Phone Orders: Yes Clinician: Afful, RN, BSN, Rita Read Back and Verified: Yes Diagnosis Coding Discharge From [facility] Services o Discharge from Wound Care Center - Treatment Completed Electronic Signature(s) Signed: 02/16/2016 3:10:57 PM By: Elpidio Eric BSN, RN Signed: 02/16/2016 4:13:44 PM By: Evlyn Kanner MD, FACS Entered By: Elpidio Eric on 02/16/2016 15:10:57 [name], [name] NMarland Kitchen ([id number]) -------------------------------------------------------------------------------- Problem List Details Patient Name: [name] Rad. Date of Service: 02/16/2016 3:00 PM Medical Record Number: [id number] Patient Account Number: [id number] Date of Birth/Sex: 04/14/1934 (81 y.o. Male) Treating RN: Clover Mealy,  RN, BSN, [place] Sink Primary Care Physician: Oval Linsey Other Clinician: Referring Physician: Oval Linsey Treating Physician/Extender: Rudene Re in Treatment: 1 Active Problems ICD-10 Encounter Code Description Active Date Diagnosis E11.622 Type 2 diabetes mellitus with other skin ulcer 02/09/2016 Yes I89.0 Lymphedema, not elsewhere classified 02/09/2016 Yes I50.42 Chronic combined systolic (congestive) and diastolic 02/09/2016 Yes (congestive) heart failure L97.221 Non-pressure chronic ulcer of left calf limited to 02/09/2016 Yes breakdown of skin Inactive Problems Resolved Problems Electronic Signature(s) Signed: 02/16/2016 3:10:59 PM By: Evlyn Kanner MD, FACS Entered By: Evlyn Kanner on 02/16/2016 15:10:59 Geralds, Karry [name]. ([id number]) -------------------------------------------------------------------------------- Progress Note Details Patient Name: [name], [name]. Date of Service: 02/16/2016 3:00 PM Medical Record Number: [id number] Patient Account Number: [id number] Date of Birth/Sex: [birth year] (81 y.o. Male) Treating RN: Clover Mealy, RN, BSN, [place] Sink Primary Care Physician: Oval Linsey Other Clinician: Referring Physician: Oval Linsey Treating Physician/Extender: Rudene Re in Treatment: 1 Subjective Chief Complaint Information obtained from Patient Patient presents to the wound care center for a consult due non healing wound to the left lower extremity with bilateral lower extremity swelling History of Present Illness (HPI) The following HPI elements were documented for the patient's wound: Location: bilateral lower extremity swelling with ulcerations on the left calf Quality: Patient reports experiencing a dull pain to affected area(s). Severity: Patient states wound are getting worse. Duration: Patient has had the wound for > 3 months prior to seeking treatment at the wound center Timing: Pain in wound is Intermittent (comes and  goes Context: The wound appeared gradually over time Modifying Factors: Other treatment(s) tried include:diuretics and is on multiple cardiac medications Associated Signs and Symptoms: Patient reports having increase swelling. 81 year old gentleman with multiple comorbidities as extensive lymphedema both lower extremities due to chronic combined systolic and diastolic heart failure. is been noncompliant with his diuretics and every now and then has ulcerations and weeping from his left lower extremity. Past medical history of diabetes mellitus type 2 which has been associated with hyperglycemia, atrial fibrillation, coronary artery disease, CHF, cardiomyopathy, subcoracoid hemorrhage, subdural hematoma, ureterolithiasis, chronic anticoagulation with Coumadin. he is status post cardiac catheterization, defibrillator placement, cholecystectomy, angioplasty of coronary vessels with stent, cystoscopy and retrograde ureteroscope E, knee arthroplasty, echocardiography of the heart. Objective Constitutional Pulse regular. Respirations normal and unlabored. Afebrile. Vitals Time Taken: 2:32 PM, Height: 64 in, Weight: 162 lbs, BMI: 27.8, Temperature: 97.6 F, Pulse: 62 bpm, Respiratory Rate: 17 breaths/min, Blood Pressure: 90/42 mmHg. General Notes: MD made aware. Per patient is normal for him [name], [name]. ([id number]) Eyes Nonicteric. Reactive to light. Ears, Nose, Mouth, and Throat Lips, teeth, and  gums WNL.Marland Kitchen Moist mucosa without lesions. Neck supple and nontender. No palpable supraclavicular or cervical adenopathy. Normal sized without goiter. Respiratory WNL. No retractions.. Breath sounds WNL, No rubs, rales, rhonchi, or wheeze.. Cardiovascular Heart rhythm and rate regular, no murmur or gallop.. Pedal Pulses WNL. No clubbing, cyanosis or edema. Lymphatic No adneopathy. No adenopathy. No adenopathy. Musculoskeletal Adexa without tenderness or enlargement.. Digits and nails w/o clubbing,  cyanosis, infection, petechiae, ischemia, or inflammatory conditions.Marland Kitchen Psychiatric Judgement and insight Intact.. No evidence of depression, anxiety, or agitation.. General Notes: his lymphedema has gone down significantly and the ulceration on the lateral part of his left calf has completely healed. Integumentary (Hair, Skin) No suspicious lesions. No crepitus or fluctuance. No peri-wound warmth or erythema. No masses.. Wound #1 status is Healed - Epithelialized. Original cause of wound was Blister. The wound is located on the Left,Lateral Lower Leg. The wound measures 0cm length x 0cm width x 0cm depth; 0cm^2 area and 0cm^3 volume. The wound is limited to skin breakdown. There is no tunneling or undermining noted. There is a large amount of serous drainage noted. The wound margin is distinct with the outline attached to the wound base. There is no granulation within the wound bed. There is no necrotic tissue within the wound bed. The periwound skin appearance did not exhibit: Callus, Crepitus, Excoriation, Fluctuance, Friable, Induration, Localized Edema, Rash, Scarring, Dry/Scaly, Maceration, Moist, Atrophie Blanche, Cyanosis, Ecchymosis, Hemosiderin Staining, Mottled, Pallor, Rubor, Erythema. Periwound temperature was noted as No Abnormality. Wound #2 status is Healed - Epithelialized. Original cause of wound was Blister. The wound is located on the Left,Proximal,Anterior Lower Leg. The wound measures 0cm length x 0cm width x 0cm depth; 0cm^2 area and 0cm^3 volume. The wound is limited to skin breakdown. There is no tunneling or undermining noted. There is a none present amount of drainage noted. The wound margin is distinct with the outline attached to the wound base. There is no granulation within the wound bed. There is no necrotic tissue within the wound bed. The periwound skin appearance exhibited: Dry/Scaly. The periwound skin appearance did not exhibit: Callus, Crepitus,  Excoriation, Fluctuance, Friable, Induration, Localized Edema, Mcmanigal, Chrisopher [name]. ([id number]) Rash, Scarring, Maceration, Moist, Atrophie Blanche, Cyanosis, Ecchymosis, Hemosiderin Staining, Mottled, Pallor, Rubor, Erythema. Periwound temperature was noted as No Abnormality. Wound #3 status is Healed - Epithelialized. Original cause of wound was Blister. The wound is located on the [facility] Lower Leg. The wound measures 0cm length x 0cm width x 0cm depth; 0cm^2 area and 0cm^3 volume. The wound is limited to skin breakdown. There is no tunneling or undermining noted. There is a none present amount of drainage noted. The wound margin is distinct with the outline attached to the wound base. There is no granulation within the wound bed. There is no necrotic tissue within the wound bed. The periwound skin appearance exhibited: Dry/Scaly. The periwound skin appearance did not exhibit: Callus, Crepitus, Excoriation, Fluctuance, Friable, Induration, Localized Edema, Rash, Scarring, Maceration, Moist, Atrophie Blanche, Cyanosis, Ecchymosis, Hemosiderin Staining, Mottled, Pallor, Rubor, Erythema. Periwound temperature was noted as No Abnormality. Assessment Active Problems ICD-10 E11.622 - Type 2 diabetes mellitus with other skin ulcer I89.0 - Lymphedema, not elsewhere classified I50.42 - Chronic combined systolic (congestive) and diastolic (congestive) heart failure L97.221 - Non-pressure chronic ulcer of left calf limited to breakdown of skin Plan Discharge From [facility] Services: Discharge from Wound Care Center - Treatment Completed he is discharged on the wound services and will be seen back only if needed. I  have recommended : 1. elevation and exercise 2. to wear stockings for compression to his left lower extremity 3. Appropriate use of his diuretics and fluid restriction to be done as per his cardiologist or PCP 4. Not a candidate for lymphedema pumps due to his cardiac pathology [name], [name] ([id number]) Electronic Signature(s) Signed: 02/16/2016 3:12:32 PM By: Evlyn Kanner MD, FACS Entered By: Evlyn Kanner on 02/16/2016 15:12:31 Ende, Gerrod NMarland Kitchen ([id number]) -------------------------------------------------------------------------------- SuperBill Details Patient Name: [name], Jadiel [name]. Date of Service: 02/16/2016 Medical Record Number: [id number] Patient Account Number: [id number] Date of Birth/Sex: Apr 19, 1934 (81 y.o. Male) Treating RN: Clover Mealy, RN, BSN, [place] Sink Primary Care Physician: Oval Linsey Other Clinician: Referring Physician: Oval Linsey Treating Physician/Extender: Rudene Re in Treatment: 1 Diagnosis Coding ICD-10 Codes Code Description [phone] Type 2 diabetes mellitus with other skin ulcer I89.0 Lymphedema, not elsewhere classified I50.42 Chronic combined systolic (congestive) and diastolic (congestive) heart failure L97.221 Non-pressure chronic ulcer of left calf limited to breakdown of skin Facility Procedures CPT4 Code: [id number] Description: 84132 - WOUND CARE VISIT-LEV 2 EST PT Modifier: Quantity: 1 Physician Procedures CPT4: Description Modifier Quantity Code [id number] 25366 - WC PHYS LEVEL 2 - EST PT 1 ICD-10 Description Diagnosis E11.622 Type 2 diabetes mellitus with other skin ulcer I89.0 Lymphedema, not elsewhere classified I50.42 Chronic combined systolic  (congestive) and diastolic (congestive) heart failure L97.221 Non-pressure chronic ulcer of left calf limited to breakdown of skin Electronic Signature(s) Signed: 02/16/2016 3:12:45 PM By: Evlyn Kanner MD, FACS Entered By: Evlyn Kanner on 02/16/2016 15:12:44

## 2016-02-20 ENCOUNTER — Encounter: Payer: Self-pay | Admitting: Cardiovascular Disease

## 2016-02-20 ENCOUNTER — Ambulatory Visit (INDEPENDENT_AMBULATORY_CARE_PROVIDER_SITE_OTHER): Payer: Medicare HMO | Admitting: Cardiovascular Disease

## 2016-02-20 VITALS — BP 110/64 | HR 80 | Ht 64.0 in | Wt 163.4 lb

## 2016-02-20 DIAGNOSIS — E782 Mixed hyperlipidemia: Secondary | ICD-10-CM | POA: Diagnosis not present

## 2016-02-20 DIAGNOSIS — I2510 Atherosclerotic heart disease of native coronary artery without angina pectoris: Secondary | ICD-10-CM

## 2016-02-20 DIAGNOSIS — E1165 Type 2 diabetes mellitus with hyperglycemia: Secondary | ICD-10-CM | POA: Diagnosis not present

## 2016-02-20 DIAGNOSIS — Z794 Long term (current) use of insulin: Secondary | ICD-10-CM

## 2016-02-20 DIAGNOSIS — E785 Hyperlipidemia, unspecified: Secondary | ICD-10-CM

## 2016-02-20 DIAGNOSIS — I255 Ischemic cardiomyopathy: Secondary | ICD-10-CM | POA: Diagnosis not present

## 2016-02-20 DIAGNOSIS — I482 Chronic atrial fibrillation: Secondary | ICD-10-CM | POA: Diagnosis not present

## 2016-02-20 DIAGNOSIS — R601 Generalized edema: Secondary | ICD-10-CM

## 2016-02-20 DIAGNOSIS — G4733 Obstructive sleep apnea (adult) (pediatric): Secondary | ICD-10-CM

## 2016-02-20 DIAGNOSIS — Z79899 Other long term (current) drug therapy: Secondary | ICD-10-CM

## 2016-02-20 DIAGNOSIS — I4821 Permanent atrial fibrillation: Secondary | ICD-10-CM

## 2016-02-20 DIAGNOSIS — IMO0001 Reserved for inherently not codable concepts without codable children: Secondary | ICD-10-CM

## 2016-02-20 DIAGNOSIS — L03116 Cellulitis of left lower limb: Secondary | ICD-10-CM

## 2016-02-20 MED ORDER — FUROSEMIDE 40 MG PO TABS: ORAL_TABLET | ORAL | 11 refills | Status: DC

## 2016-02-20 MED ORDER — CEPHALEXIN 500 MG PO CAPS
500.0000 mg | ORAL_CAPSULE | Freq: Two times a day (BID) | ORAL | 0 refills | Status: DC
Start: 2016-02-20 — End: 2016-04-30

## 2016-02-20 NOTE — Patient Instructions (Signed)
Your physician has recommended you make the following change in your medication:   1.) the furosemide has been changed to 1& 1/2 tablet in the Morning and 1/2 tablet around 3 PM.  2.) START new prescription written today for antibiotic.  Your physician recommends that you return for lab work in: 1WEEK.   Your physician recommends that you schedule a follow-up appointment in: 3-4 months.   How to Use Compression Stockings Compression stockings are elastic socks that squeeze the legs. They help to increase blood flow to the legs, decrease swelling in the legs, and reduce the chance of developing blood clots in the lower legs. Compression stockings are often used by people who:  Are recovering from surgery.  Have poor circulation in their legs.  Are prone to getting blood clots in their legs.  Have varicose veins.  Sit or stay in bed for long periods of time. How to use compression stockings Before you put on your compression stockings:  Make sure that they are the correct size. If you do not know your size, ask your health care provider.  Make sure that they are clean, dry, and in good condition.  Check them for rips and tears. Do not put them on if they are ripped or torn. Put your stockings on first thing in the morning, before you get out of bed. Keep them on for as long as your health care provider advises. When you are wearing your stockings:  Keep them as smooth as possible. Do not allow them to bunch up. It is especially important to prevent the stockings from bunching up around your toes or behind your knees.  Do not roll the stockings downward and leave them rolled down. This can decrease blood flow to your leg.  Change them right away if they become wet or dirty. When you take off your stockings, inspect your legs and feet. Anything that does not seem normal may require medical attention. Look for:  Open sores.  Red spots.  Swelling. Information and tips  Do not  stop wearing your compression stockings without talking to your health care provider first.  Wash your stockings every day with mild detergent in cold or warm water. Do not use bleach. Air-dry your stockings or dry them in a clothes dryer on low heat.  Replace your stockings every 3-6 months.  If skin moisturizing is part of your treatment plan, apply lotion or cream at night so that your skin will be dry when you put on the stockings in the morning. It is harder to put the stockings on when you have lotion on your legs or feet. Contact a health care provider if: Remove your stockings and seek medical care if:  You have a feeling of pins and needles in your feet or legs.  You have any new changes in your skin.  You have skin lesions that are getting worse.  You have swelling or pain that is getting worse. Get help right away if:  You have numbness or tingling in your lower legs that does not get better right after you take the stockings off.  Your toes or feet become cold and blue.  You develop open sores or red spots on your legs that do not go away.  You see or feel a warm spot on your leg.  You have new swelling or soreness in your leg.  You are short of breath or you have chest pain for no reason.  You have a rapid  or irregular heartbeat.  You feel light-headed or dizzy. This information is not intended to replace advice given to you by your health care provider. Make sure you discuss any questions you have with your health care provider. Document Released: 01/20/2009 Document Revised: 08/23/2015 Document Reviewed: 03/02/2014 Elsevier Interactive Patient Education  2017 [employer].

## 2016-02-27 NOTE — Progress Notes (Signed)
Patient ID: [name], male   DOB: March 28, 1935, 81 y.o.   MRN: [id number]     HPI: [name] is a 81 y.o. male who presents to the office for a 21 month cardiology evaluation.  [name] has a history of an ischemic cardiomyopathy. In April 1998 he suffered a large anterior wall myocardial infarction and underwent intervention to the LAD. In April 1999 a stent was placed to the LAD and he had PTCA of his circumflex vessel. In November 2004 he underwent stenting of his RCA.  I had not seen him for a 7 year period but ultimately he presented to me in January 2014. Prior to that, he had been hospitalized after a fall and developed rib fractures and a mild subarachnoid hemorrhage. He also was found to be in permanent atrial fibrillation and had been on Coumadin therapy. When I saw him in February 2014 and nuclear study showed extensive scar entire LAD territory as well as a large portion of the RCA territory with minimal borderline ischemia. Ejection fraction was less than 20% on echo Doppler study in April 2014 consequently I recommended he undergo an ICD implantation. This was done by Dr.Croitoru in May 2014.  Additional problems include hypertension, peripheral edema, Coumadin anticoagulation, and hyperlipidemia. He also has obesity. He has a history of obstructive sleep apnea and is on CPAP therapy.  He does admit to frequent having ecchymoses in his right upper arm. He has purposely lost weight from a maximum weight of 245 to now 176.    He was hospitalized in [facility] in late December 2015  When he was admitted with hypotension, leukocytosis, and cough.  He was treated with vancomycin and Zosyn empirically for possible septicemia.  He had minimal elevation of his troponins.  He was not felt to have an MI.  He underwent a follow-up echo Doppler study at that time on 04/05/2014.  Ejection fraction was 30%.  There was akinesis of the distal lateral, distal anterior, distal inferior, mid,  distal, inferoseptal, and apical walls with diffuse hypokinesis elsewhere.  There was trivial AR, mild MR , and mild left atrial dilatation.  He is a history of permanent atrial fibrillation.  He had experienced a fall and developed an intracranial hemorrhage in 2016 and for this reason, is no longer on anticoagulation therapy.  He underwent an echo Doppler study in July 2016 showed EF of 20-25%.  There was diffuse hypokinesis and akinesis of the anteroseptal and apical myocardium.  There was trivial AR.  There was mitral annular calcification with mild MR and mild left atrial dilatation.  PA pressure was mildly increased at 33 mm.  Carotid Dopplers were done at that time which showed patent vertebral arteries.  There was one of 39% stenosis involving the right internal carotid artery and the left internal carotid artery.  In September, he again lost his balance when trying to catch a grocery cart in the parking lot and had fallen.  He has seen Dr. Wyonia Hough for ICD follow-up and was seen last on 01/15/2016.  At that time he was felt to be mildly hypervolemic but well compensated.  His rate control of his atrial fibrillation was suboptimal but blood pressure was limiting dose titration.  He had normal ICD device function with very rare episodes of true nonsustained VT.  Most high ventricular rate episodes were felt to represent atrial fibrillation with rapid ventricular response.  He is undergoing remote downloads every 3 months.  He  denies any episodes of chest pain.  He admits to lower extremity edema, left greater than right.  He has noticed some redness to his lower extremities bilaterally.  He presents for evaluation.   Past Medical History:  Diagnosis Date  . Arteriosclerotic cardiovascular disease (ASCVD)   . At risk for sudden cardiac death 2012-08-19  . Atrial fibrillation (HCC)    Onset in 2012  . Atrial flutter (HCC)   . Bleeding in brain due to brain aneurysm [facility]) October 06 2014  . CHF  (congestive heart failure) (HCC)   . Chronic anticoagulation 2012   2012  . COPD (chronic obstructive pulmonary disease) (HCC)   . DJD (degenerative joint disease)   . Gout   . Hyperlipidemia   . ICD (implantable cardiac defibrillator) in place   . Ischemic cardiomyopathy   . Kidney stone    "just once" (08/17/2012)  . Myocardial infarction 1998  . NICM (nonischemic cardiomyopathy), EF 25-30% 19-Aug-2012  . Obstructive sleep apnea    "went away when I lost a bunch of weight" (08/17/2012)  . Permanent atrial fibrillation (HCC) 02/15/2011   Initial onset in 03/2011 with rapid ventricular response   . S/P ICD (internal cardiac defibrillator) procedure, 08/17/12, [employer] 19-Aug-2012   [facility]  . Type II diabetes mellitus (HCC)     Past Surgical History:  Procedure Laterality Date  . CARDIAC CATHETERIZATION  02/2003  . CARDIAC DEFIBRILLATOR PLACEMENT  08/17/2012   [employer]  . CATARACT EXTRACTION W/ INTRAOCULAR LENS  IMPLANT, BILATERAL Bilateral ~ 2011  . CHOLECYSTECTOMY  2009  . CORONARY ANGIOPLASTY WITH STENT PLACEMENT  07/14/1996   "1" (08/17/2012)  . CYSTOSCOPY/RETROGRADE/URETEROSCOPY  06/28/2011   Procedure: CYSTOSCOPY/RETROGRADE/URETEROSCOPY;  Surgeon: Ky Barban, MD;  Location: [facility];  Service: Urology;  Laterality: Right;  . IMPLANTABLE CARDIOVERTER DEFIBRILLATOR IMPLANT N/A 08/17/2012   Procedure: IMPLANTABLE CARDIOVERTER DEFIBRILLATOR IMPLANT;  Surgeon: Thurmon Fair, MD;  Location: [facility];  Service: Cardiovascular;  Laterality: N/A;  . KNEE ARTHROPLASTY Left 1978   "tendon & cartilege repair" (08/17/2012)  . Myoview perfusion scan  06/02/2012   low risk, extensive scar entire LAD & RCA territory  . s/p icd  08/2012   [facility]  . STONE EXTRACTION WITH BASKET  06/28/2011   Procedure: STONE EXTRACTION WITH BASKET;  Surgeon: Ky Barban, MD;  Location: [facility];  Service: Urology;  Laterality: Right;  specimen given to family per MD  . US  ECHOCARDIOGRAPHY  07/08/2012   EF <20%,mild MR,TR,LA severely dilated  . VASECTOMY  ~ 1964    Allergies  Allergen Reactions  . Lipitor [Atorvastatin] Other (See Comments)    myalgias   . Percocet [Oxycodone-Acetaminophen] Nausea And Vomiting    Needs to drink milk and take a Zofran  . Procaine Hcl Nausea And Vomiting  . Tramadol Nausea And Vomiting  . Vicodin [Hydrocodone-Acetaminophen] Nausea And Vomiting    Current Outpatient Prescriptions  Medication Sig Dispense Refill  . aspirin EC 81 MG tablet Take 81 mg by mouth daily.    . digoxin (LANOXIN) 0.25 MG tablet Take 1 tablet (0.25 mg total) by mouth daily. 30 tablet 11  . divalproex (DEPAKOTE ER) 500 MG 24 hr tablet Take 2 tablets (1,000 mg total) by mouth at bedtime. 60 tablet 11  . furosemide (LASIX) 40 MG tablet Take 1.5 tablets in the morning and 1/2 tablet at 3 pm 60 tablet 11  . magnesium oxide (MAG-OX) 400 MG tablet Take 1 tablet (400 mg total) by mouth  2 (two) times daily. 60 tablet 11  . metFORMIN (GLUCOPHAGE) 500 MG tablet Take by mouth 2 (two) times daily with a meal.    . potassium chloride (K-DUR) 10 MEQ tablet Take 1 tablet (10 mEq total) by mouth daily. 30 tablet 11  . pravastatin (PRAVACHOL) 40 MG tablet Take 1 tablet (40 mg total) by mouth daily. 30 tablet 11  . cephALEXin (KEFLEX) 500 MG capsule Take 1 capsule (500 mg total) by mouth 2 (two) times daily. 20 capsule 0   No current facility-administered medications for this visit.     Social History   Social History  . Marital status: Married    Spouse name: N/A  . Number of children: 3  . Years of education: 9th   Occupational History  . Retired    Social History Main Topics  . Smoking status: Former Smoker    Packs/day: 2.00    Years: 40.00    Types: Cigarettes    Quit date: 04/08/1992  . Smokeless tobacco: Former Systems developer  . Alcohol use No     Comment: 08/17/2012 "quit drinking in 1983"  . Drug use: No  . Sexual activity: No   Other Topics Concern    . Not on file   Social History Narrative   Lives at home with his wife.   Right-handed.   No caffeine use.    Family History  Problem Relation Age of Onset  . Heart failure Mother   . Heart failure Father     ROS General: Negative; No fevers, chills, or night sweats; purposeful weight loss from 245 pounds to 175 pounds. HEENT: Negative; No changes in vision or hearing, sinus congestion, difficulty swallowing Pulmonary: Negative; No cough, wheezing, shortness of breath, hemoptysis Cardiovascular:See history of present illness GI: Negative; No nausea, vomiting, diarrhea, or abdominal pain GU: Positive for kidney stone; No dysuria, hematuria, or difficulty voiding Musculoskeletal: Negative; no myalgias, joint pain, or weakness Hematologic/Oncology: Negative; no easy bruising, bleeding Endocrine: Positive for diabetes mellitus; no heat/cold intolerance; no diabetes Neuro: Negative; no changes in balance, headaches Skin: Negative; No rashes or skin lesions Psychiatric: Negative; No behavioral problems, depression Sleep: Positive for sleep apnea on CPAP therapy.; No snoring, daytime sleepiness, hypersomnolence, bruxism, restless legs, hypnogognic hallucinations, no cataplexy Other comprehensive 14 point system review is negative.   PE BP 110/64   Pulse 80   Ht '5\' 4"'$  (1.626 m)   Wt 163 lb 6.4 oz (74.1 kg)   BMI 28.05 kg/m    Repeat blood pressure by me was 98/64 supine and 98/60 standing.  Wt Readings from Last 3 Encounters:  02/20/16 163 lb 6.4 oz (74.1 kg)  01/15/16 160 lb 3.2 oz (72.7 kg)  01/05/16 159 lb (72.1 kg)   Over the past 2 years.  He admits to an 85-100 pound weight loss.  General: Alert, oriented, no distress.  Skin: normal turgor, no rashes; area of ecchymosis in the right upper arm HEENT: Normocephalic, atraumatic. Pupils round and reactive; sclera anicteric;no lid lag.  Nose without nasal septal hypertrophy Mouth/Parynx benign; Mallinpatti scale 3; mild  arterial narrowing without hemorrhages or exudate Neck: No JVD, no carotid bruits with normal carotid upstroke Lungs: clear to ausculatation and percussion; no wheezing or rales Chest wall: no tenderness to palpitation Heart: RRR, s1 s2 normal 1/6 systolic murmur; no S3 gallop.  No diastolic murmur, rubs, thrills or heaves. Abdomen: soft, nontender; no hepatosplenomehaly, BS+; abdominal aorta nontender and not dilated by palpation. Back: no CVA tenderness Pulses 2+  Extremities:  2+ pitting edema, left greater than right lower extremity with bilateral erythema suggesting possible early cellulitis.  no clubbing cyanosis, Homan's sign negative  Neurologic: grossly nonfocal Psychologic: normal affect and mood.  ECG (independently read by me): Atrial fibrillation at 80 bpm.  Nonspecific T changes.  QTc interval 419 ms.  February 2016 ECG (independently read by me): Atrial fibrillation at 72 bpm. Nonspecific IVCD. Mild T wave abdomen.  These inferiorly.  September 2015ECG (independently read by me): Permanent atrial fibrillation with a ventricular rate of 78 beats per minute.  Nonspecific ST-T changes.  Appropriate pacing.  Prior ECG: Underlying atrial fibrillation with appropriate Ventricular pacing and sensing.  LABS:  BMP Latest Ref Rng & Units 01/15/2016 12/23/2015 12/22/2015  Glucose 65 - 99 mg/dL 91 149(H) 157(H)  BUN 7 - 25 mg/dL 26(H) 31(H) 31(H)  Creatinine 0.70 - 1.18 mg/dL 1.35(H) 1.35(H) 1.46(H)  Sodium 135 - 146 mmol/L 140 137 134(L)  Potassium 3.5 - 5.3 mmol/L 4.7 3.8 6.0(H)  Chloride 98 - 110 mmol/L 102 93(L) 94(L)  CO2 20 - 31 mmol/L 30 32 26  Calcium 8.6 - 10.3 mg/dL 9.7 9.2 9.1    Hepatic Function Latest Ref Rng & Units 01/15/2016 12/22/2015 07/12/2015  Total Protein 6.1 - 8.1 g/dL 5.7(L) 6.5 7.2  Albumin 3.6 - 5.1 g/dL 3.6 4.1 4.3  AST 10 - 35 U/L 21 55(H) 22  ALT 9 - 46 U/L 12 21 13(L)  Alk Phosphatase 40 - 115 U/L 48 49 55  Total Bilirubin 0.2 - 1.2 mg/dL 0.6 2.2(H) 1.2   Bilirubin, Direct 0.0 - 0.3 mg/dL - - -    CBC Latest Ref Rng & Units 12/22/2015 07/12/2015 11/13/2014  WBC 4.0 - 10.5 K/uL 13.1(H) 10.4 8.9  Hemoglobin 13.0 - 17.0 g/dL 15.8 15.8 16.3  Hematocrit 39.0 - 52.0 % 48.3 45.1 47.9  Platelets 150 - 400 K/uL 152 176 141(L)   Lab Results  Component Value Date   MCV 102.8 (H) 12/22/2015   MCV 98.3 07/12/2015   MCV 101.1 (H) 11/13/2014   Lab Results  Component Value Date   TSH 2.100 11/28/2015   BNP    Component Value Date/Time   PROBNP 3,742.0 (H) 03/25/2012 0513    Lipid Panel     Component Value Date/Time   CHOL 101 (L) 01/15/2016 1057   TRIG 97 01/15/2016 1057   HDL 36 (L) 01/15/2016 1057   CHOLHDL 2.8 01/15/2016 1057   VLDL 19 01/15/2016 1057   LDLCALC 46 01/15/2016 1057     RADIOLOGY: No results found.   ASSESSMENT AND PLAN: [name] is a very pleasant 81 year old gentleman who has a history of an ischemic cardiomyopathy who sohffered his initial anterior wall myocardial infarction in 1998. He subsequently developed permanent atrial fibrillation and  underwent ICD implantation by Dr. Sallyanne Kuster with a single chamber ICD for primary prevention per Maditt II criteria .  He now is permanent atrial fibrillation.  Ventricular rate is well-controlled on ECG today.  I reviewed records since my last office evaluation.  He had experienced several falls in 2016 developed an intracranial hemorrhage.  He's been off anticoagulation.  His blood pressure has remained on the low side, which is limited medication titration.  Presently he does have signs of lower extremity edema, but his lungs are clear.  He has been taking furosemide 60 mg daily.  He also has redness and erythema in both lower extremities, left greater than right, suggesting the possibility of cellulitis.  For this reason,  I'm giving him a prescription for cephalexin to take for the next 10-14 days.  I recommended compression stockings.  I'm increasing his Lasix to 60 mg in the  morning but he will take 20 mg in the afternoon.  In one week.  A follow-up Bmet, magnesium and BNP will be obtained.  I will see him several months for reevaluation.  He has obstructive sleep apnea and continues to use CPAP therapy with 100% compliance.  He is unaware of any breakthrough snoring.  He has not had any defibrillator discharges.  I will see him 3 months later for further evaluation.  Time spent: 25 minutes  Troy Sine, MD, [facility]  02/27/2016 8:05 AM

## 2016-03-04 ENCOUNTER — Telehealth: Payer: Self-pay | Admitting: *Deleted

## 2016-03-04 ENCOUNTER — Ambulatory Visit: Payer: Medicare HMO | Admitting: Neurology

## 2016-03-04 NOTE — Telephone Encounter (Signed)
No showed follow up appointment today. 

## 2016-03-28 ENCOUNTER — Telehealth: Payer: Self-pay | Admitting: Neurology

## 2016-03-28 NOTE — Telephone Encounter (Signed)
Pt called to advise since decreasing divalproex (DEPAKOTE ER) 500 MG 24 hr tablet his hands are shaking, dizzy for about 1-1/2 weeks.

## 2016-03-28 NOTE — Telephone Encounter (Signed)
Left message for a return call

## 2016-03-28 NOTE — Telephone Encounter (Signed)
Spoke to patient - while taking Depakote ER 500mg , two tabs at qhs he was experiencing dizziness and bilateral hand tremors.  After a few weeks, he decreased his dose to just 500mg , qhs.  His symptoms remained unchanged.  Two days ago, he stopped the medication completely.  States dizziness and tremors have resolved.  He has an appt on 04/03/16 to further discuss his treatment plan.

## 2016-04-03 ENCOUNTER — Encounter: Payer: Self-pay | Admitting: Neurology

## 2016-04-03 ENCOUNTER — Ambulatory Visit (INDEPENDENT_AMBULATORY_CARE_PROVIDER_SITE_OTHER): Payer: Medicare HMO | Admitting: Neurology

## 2016-04-03 VITALS — BP 102/60 | HR 54 | Ht 64.0 in | Wt 165.0 lb

## 2016-04-03 DIAGNOSIS — E1165 Type 2 diabetes mellitus with hyperglycemia: Secondary | ICD-10-CM | POA: Diagnosis not present

## 2016-04-03 DIAGNOSIS — Z794 Long term (current) use of insulin: Secondary | ICD-10-CM

## 2016-04-03 DIAGNOSIS — S065X9A Traumatic subdural hemorrhage with loss of consciousness of unspecified duration, initial encounter: Secondary | ICD-10-CM

## 2016-04-03 DIAGNOSIS — I6200 Nontraumatic subdural hemorrhage, unspecified: Secondary | ICD-10-CM | POA: Diagnosis not present

## 2016-04-03 DIAGNOSIS — R569 Unspecified convulsions: Secondary | ICD-10-CM | POA: Diagnosis not present

## 2016-04-03 DIAGNOSIS — S065XAA Traumatic subdural hemorrhage with loss of consciousness status unknown, initial encounter: Secondary | ICD-10-CM

## 2016-04-03 DIAGNOSIS — I609 Nontraumatic subarachnoid hemorrhage, unspecified: Secondary | ICD-10-CM

## 2016-04-03 DIAGNOSIS — IMO0001 Reserved for inherently not codable concepts without codable children: Secondary | ICD-10-CM

## 2016-04-03 MED ORDER — LAMOTRIGINE 100 MG PO TABS: 100.0000 mg | ORAL_TABLET | Freq: Two times a day (BID) | ORAL | 11 refills | Status: DC

## 2016-04-03 MED ORDER — LAMOTRIGINE 25 MG PO TABS: ORAL_TABLET | ORAL | 0 refills | Status: DC

## 2016-04-03 NOTE — Progress Notes (Signed)
PATIENT: [name] DOB: [birth year]  Chief Complaint  Patient presents with  . Seizures    He is here with his wife, [name] and his daughter, [name]. While taking Depakote ER 500mg , two tabs at qhs, he was experiencing dizziness and bilateral hand tremors.  After a few weeks, he decreased his dose to just 500mg , qhs.  His symptoms remained unchanged.  He stopped the medication completely on 03/26/16.  States dizziness and tremors have resolved.  He further discuss his treatment plan.     HISTORICAL  [name] 81 yo RH, wife, daughter, , seen in refer by his primary care physician [name], [name] for seizure, he is accompanied by his wife, and daughter [name] at today's clinical visit  He had past medical history of hypertension, hyperlipidemia, diabetes, coronary artery disease, status post stent, atrial fibrillation, was taking coumadin, stopped in June 30th 2016, not on anticoagulation at this point,  He also had a history of ICD, not MRI candidate, ischemic congestive heart failure, ejection fraction 25-30%.  In October 06 2014, he fell backwards sitting in the the chair, with transient loss of consciousness for a few minutes, later he was noted by family member to have confusion, could not recognize his friend, he was taken by ambulance to [facility] on October 06 2014, I have reviewed at CAT scan of the brain, mild generalized atrophy, more at bilateral frontal region, no acute intracranial abnormality. He also suffered left fifth rib fracture by chest x-ray.  He was discharged home, on July second 2016, he began to complains of worsening diffuse headaches, presented to local emergency room, I have reviewed CAT scan on July 2nd 2016, there was diffuse bilateral frontal atrophy, no acute lesions.   On July third 2016, while watching TV, he had his first seizure, eyes rolled back, right hand drawn up followed by loss of consciousness tonic-clonic movement.   He had recurrent  seizure on October 16 2014, again presented to the emergency room, repeat CAT scan in October 16 2014 showed mixed the density subdural hematoma on the left, 6 mm in the left frontal region.  5 mm right frontal subdural hematoma. EEG was normal October 17 2014 Due to subdural hematoma, Coumadin was stopped, the plan was to start Eliquis in one wee.  Most recent seizure was October 20 2014, started from his right hand, difficulty controlling his right hand, followed by loss of consciousness, right hand arm contraction, post event confusion lasted 10-15 minutes, he is now taking Keppra 1000 mg twice a day, tolerating it well, he has no recurrent seizure  UPDATE August 22nd 2017: He came in with his wife and daughter at today's clinical visit, last visit was in August 2016, he has lost follow-up, he had a history of atrial fibrillation, his cardiologist is Dr. Tresa EndoKelly, he is no longer on anticoagulation or antiplatelet agent, there was no recurrent seizure, he is still taking Keppra 1000 mg twice a day, he complains of agitation, could not sleep, he contributed to the side effect of Keppra,  I reviewed his emergency room visit in April 2017, for similar complaints, I personally reviewed CT head without contrast in April 2017, generalized atrophy, periventricular small vessel disease, no interval change compared to August 2016.  UPDATE Dec 27th 2017: He could not tolerate Depakote, while taking 1000 units every night, he complains of hand shaking, could not not keep his left hand still on steering wheel, which has improved after he stop  taking depakote,    He is doing well,  His daughter is going chemotherapy.   REVIEW OF SYSTEMS: Full 14 system review of systems performed and notable only for as above.  ALLERGIES: Allergies  Allergen Reactions  . Lipitor [Atorvastatin] Other (See Comments)    myalgias   . Percocet [Oxycodone-Acetaminophen] Nausea And Vomiting    Needs to drink milk and take a Zofran  .  Procaine Hcl Nausea And Vomiting  . Tramadol Nausea And Vomiting  . Vicodin [Hydrocodone-Acetaminophen] Nausea And Vomiting    HOME MEDICATIONS: Current Outpatient Prescriptions  Medication Sig Dispense Refill  . digoxin (LANOXIN) 0.25 MG tablet Take 1 tablet (0.25 mg total) by mouth daily. 30 tablet 3  . levETIRAcetam (KEPPRA) 1000 MG tablet Take 1 tablet (1,000 mg total) by mouth 2 (two) times daily. 60 tablet 3  . magnesium oxide (MAG-OX) 400 MG tablet Take 1 tablet (400 mg total) by mouth 2 (two) times daily. 60 tablet 2  . metFORMIN (GLUCOPHAGE) 1000 MG tablet Take 1 tablet (1,000 mg total) by mouth 2 (two) times daily with a meal. 60 tablet 3  . pravastatin (PRAVACHOL) 40 MG tablet Take 1 tablet (40 mg total) by mouth daily. 30 tablet 3     PAST MEDICAL HISTORY: Past Medical History:  Diagnosis Date  . Arteriosclerotic cardiovascular disease (ASCVD)   . At risk for sudden cardiac death September 16, 2012  . Atrial fibrillation (HCC)    Onset in 2012  . Atrial flutter (HCC)   . Bleeding in brain due to brain aneurysm [facility]) October 06 2014  . CHF (congestive heart failure) (HCC)   . Chronic anticoagulation 2012   2012  . COPD (chronic obstructive pulmonary disease) (HCC)   . DJD (degenerative joint disease)   . Gout   . Hyperlipidemia   . ICD (implantable cardiac defibrillator) in place   . Ischemic cardiomyopathy   . Kidney stone    "just once" (08/17/2012)  . Myocardial infarction 1998  . NICM (nonischemic cardiomyopathy), EF 25-30% 2012-09-16  . Obstructive sleep apnea    "went away when I lost a bunch of weight" (08/17/2012)  . Permanent atrial fibrillation (HCC) 02/15/2011   Initial onset in 03/2011 with rapid ventricular response   . S/P ICD (internal cardiac defibrillator) procedure, 08/17/12, [employer] 09-16-2012   [employer]  . Type II diabetes mellitus (HCC)     PAST SURGICAL HISTORY: Past Surgical History:  Procedure Laterality Date  . CARDIAC  CATHETERIZATION  02/2003  . CARDIAC DEFIBRILLATOR PLACEMENT  08/17/2012   [employer]  . CATARACT EXTRACTION W/ INTRAOCULAR LENS  IMPLANT, BILATERAL Bilateral ~ 2011  . CHOLECYSTECTOMY  2009  . CORONARY ANGIOPLASTY WITH STENT PLACEMENT  07/14/1996   "1" (08/17/2012)  . CYSTOSCOPY/RETROGRADE/URETEROSCOPY  06/28/2011   Procedure: CYSTOSCOPY/RETROGRADE/URETEROSCOPY;  Surgeon: Ky Barban, MD;  Location: [facility];  Service: Urology;  Laterality: Right;  . IMPLANTABLE CARDIOVERTER DEFIBRILLATOR IMPLANT N/A 08/17/2012   Procedure: IMPLANTABLE CARDIOVERTER DEFIBRILLATOR IMPLANT;  Surgeon: Thurmon Fair, MD;  Location: [facility] CATH [facility];  Service: Cardiovascular;  Laterality: N/A;  . KNEE ARTHROPLASTY Left 1978   "tendon & cartilege repair" (08/17/2012)  . Myoview perfusion scan  06/02/2012   low risk, extensive scar entire LAD & RCA territory  . s/p icd  08/2012   [employer]  . STONE EXTRACTION WITH BASKET  06/28/2011   Procedure: STONE EXTRACTION WITH BASKET;  Surgeon: Ky Barban, MD;  Location: [facility];  Service: Urology;  Laterality: Right;  specimen given to  family per MD  . US ECHOCARDIOGRAPHY  07/08/2012   EF <20%,mild MR,TR,LA severely dilated  . VASECTOMY  ~ 1964    FAMILY HISTORY: Family History  Problem Relation Age of Onset  . Heart failure Mother   . Heart failure Father     SOCIAL HISTORY:  Social History   Social History  . Marital status: Married    Spouse name: N/A  . Number of children: 3  . Years of education: 9th   Occupational History  . Retired    Social History Main Topics  . Smoking status: Former Smoker    Packs/day: 2.00    Years: 40.00    Types: Cigarettes    Quit date: 04/08/1992  . Smokeless tobacco: Former Neurosurgeon  . Alcohol use No     Comment: 08/17/2012 "quit drinking in 1983"  . Drug use: No  . Sexual activity: No   Other Topics Concern  . Not on file   Social History Narrative   Lives at home with his wife.   Right-handed.   No caffeine  use.   PHYSICAL EXAM   Vitals:   04/03/16 1307  BP: 102/60  Pulse: (!) 54  Weight: 165 lb (74.8 kg)  Height: 5\' 4"  (1.626 m)    Not recorded      Body mass index is 28.32 kg/m.  PHYSICAL EXAMNIATION:  Gen: NAD, conversant, well nourised, obese, well groomed                     Cardiovascular: Regular rate rhythm, no peripheral edema, warm, nontender. Eyes: Conjunctivae clear without exudates or hemorrhage Neck: Supple, no carotid bruise. Pulmonary: Clear to auscultation bilaterally   NEUROLOGICAL EXAM:  MENTAL STATUS: Speech:    Speech is normal; fluent and spontaneous with normal comprehension.  Cognition:     Orientation to time, place and person     Normal recent and remote memory     Normal Attention span and concentration     Normal Language, naming, repeating,spontaneous speech     Fund of knowledge   CRANIAL NERVES: CN II: Visual fields are full to confrontation. Fundoscopic exam is normal with sharp discs and no vascular changes. Pupils are round equal and briskly reactive to light. CN III, IV, VI: extraocular movement are normal. No ptosis. CN V: Facial sensation is intact to pinprick in all 3 divisions bilaterally. Corneal responses are intact.  CN VII: Face is symmetric with normal eye closure and smile. CN VIII: Hearing is normal to rubbing fingers CN IX, X: Palate elevates symmetrically. Phonation is normal. CN XI: Head turning and shoulder shrug are intact CN XII: Tongue is midline with normal movements and no atrophy.  MOTOR: There is no pronator drift of out-stretched arms. Muscle bulk and tone are normal. Muscle strength is normal.  REFLEXES: Reflexes are 2+ and symmetric at the biceps, triceps, knees, and ankles. Plantar responses are flexor.  SENSORY: Intact to light touch, pinprick, position sense, and vibration sense are intact in fingers and toes.  COORDINATION: Rapid alternating movements and fine finger movements are intact. There is  no dysmetria on finger-to-nose and heel-knee-shin.    GAIT/STANCE: Posture is normal. Gait is steady with normal steps, base, arm swing, and turning. Heel and toe walking are normal. Mild difficulty with tandem walking  DIAGNOSTIC DATA (LABS, IMAGING, TESTING) - I reviewed patient records, labs, notes, testing and imaging myself where available.   ASSESSMENT AND PLAN  JAHMIER WILLADSEN is a 81  y.o. male  is complicated past medical history, history of atrial fibrillation, was on chronic Coumadin treatment, ischemic congestive heart failure, ejection fraction 25-30%  Subdural hemorrhage in October 2016:   History of chronic atrial fibrillation, was on Coumadin, Coumadin has been on hold since October 16 2014,   Repeat CAT scan in April 2017 showed chronic atrophy, periventricular small vessel disease    Chronic atrial fibrillation   baby aspirin daily Agitation, chronic insomnia: Complex partial seizure:   Reported agitation with Keppra 1000 mg twice a day, last seizure was in July 2016  Could not tolerate depakote ER 500mg  2 tabs every night, complains of dizziness, hand shaking.  Will start Lamotrigine titrating to 100mg  bid.     Terrace Arabia, M.D. Ph.D.  [facility] [address] [place], [place] [address] Ph: [phone] Fax: [phone]  CC: To Dr. Oval Linsey

## 2016-04-10 ENCOUNTER — Telehealth: Payer: Self-pay | Admitting: Cardiovascular Disease

## 2016-04-10 NOTE — Telephone Encounter (Signed)
Spoke with patient's daughter.  Advised that it is safe for patient to use an electric blanket or heating pad with his ICD, as long as it is in good working order.  Patient's daughter reports that she read online that he shouldn't use a heated throw blanket.  Advised that if he is concerned, he can keep the blanket 6in away from his device.  Patient's daughter verbalizes understanding and denies additional questions or concerns at this time.

## 2016-04-10 NOTE — Telephone Encounter (Signed)
Pt has a defibrillator,can he use an electric blanket?

## 2016-04-10 NOTE — Telephone Encounter (Signed)
Follow up      Daughter is calling back to see if pt can use an electric blanket or heating pad with his defibulator?  Please call

## 2016-04-15 ENCOUNTER — Ambulatory Visit (INDEPENDENT_AMBULATORY_CARE_PROVIDER_SITE_OTHER): Payer: Medicare HMO | Admitting: *Deleted

## 2016-04-15 DIAGNOSIS — I255 Ischemic cardiomyopathy: Secondary | ICD-10-CM | POA: Diagnosis not present

## 2016-04-16 NOTE — Progress Notes (Signed)
Remote ICD transmission.   

## 2016-04-17 ENCOUNTER — Encounter: Payer: Self-pay | Admitting: Cardiology

## 2016-04-23 ENCOUNTER — Encounter (HOSPITAL_COMMUNITY): Payer: Self-pay | Admitting: Emergency Medicine

## 2016-04-23 ENCOUNTER — Emergency Department (HOSPITAL_COMMUNITY): Payer: Medicare HMO

## 2016-04-23 ENCOUNTER — Emergency Department (HOSPITAL_COMMUNITY)
Admission: EM | Admit: 2016-04-23 | Discharge: 2016-04-24 | Disposition: A | Discharge status: Home / Self Care | Payer: Medicare HMO | Attending: Emergency Medicine | Admitting: Emergency Medicine

## 2016-04-23 DIAGNOSIS — I252 Old myocardial infarction: Secondary | ICD-10-CM | POA: Insufficient documentation

## 2016-04-23 DIAGNOSIS — I2510 Atherosclerotic heart disease of native coronary artery without angina pectoris: Secondary | ICD-10-CM | POA: Diagnosis not present

## 2016-04-23 DIAGNOSIS — E119 Type 2 diabetes mellitus without complications: Secondary | ICD-10-CM | POA: Diagnosis not present

## 2016-04-23 DIAGNOSIS — R0602 Shortness of breath: Secondary | ICD-10-CM

## 2016-04-23 DIAGNOSIS — I5043 Acute on chronic combined systolic (congestive) and diastolic (congestive) heart failure: Secondary | ICD-10-CM | POA: Diagnosis not present

## 2016-04-23 DIAGNOSIS — Z87891 Personal history of nicotine dependence: Secondary | ICD-10-CM | POA: Insufficient documentation

## 2016-04-23 DIAGNOSIS — Z79899 Other long term (current) drug therapy: Secondary | ICD-10-CM | POA: Insufficient documentation

## 2016-04-23 DIAGNOSIS — Z7982 Long term (current) use of aspirin: Secondary | ICD-10-CM | POA: Diagnosis not present

## 2016-04-23 DIAGNOSIS — I110 Hypertensive heart disease with heart failure: Secondary | ICD-10-CM | POA: Diagnosis not present

## 2016-04-23 DIAGNOSIS — Z95810 Presence of automatic (implantable) cardiac defibrillator: Secondary | ICD-10-CM | POA: Insufficient documentation

## 2016-04-23 DIAGNOSIS — Z7984 Long term (current) use of oral hypoglycemic drugs: Secondary | ICD-10-CM | POA: Insufficient documentation

## 2016-04-23 DIAGNOSIS — J449 Chronic obstructive pulmonary disease, unspecified: Secondary | ICD-10-CM | POA: Diagnosis not present

## 2016-04-23 LAB — BASIC METABOLIC PANEL
Anion gap: 10 (ref 5–15)
BUN: 22 mg/dL — AB (ref 6–20)
CALCIUM: 10 mg/dL (ref 8.9–10.3)
CHLORIDE: 100 mmol/L — AB (ref 101–111)
CO2: 26 mmol/L (ref 22–32)
CREATININE: 1.52 mg/dL — AB (ref 0.61–1.24)
GFR, EST AFRICAN AMERICAN: 48 mL/min — AB (ref >60)
GFR, EST NON AFRICAN AMERICAN: 42 mL/min — AB (ref >60)
Glucose, Bld: 170 mg/dL — ABNORMAL HIGH (ref 65–99)
Potassium: 4.8 mmol/L (ref 3.5–5.1)
SODIUM: 136 mmol/L (ref 135–145)

## 2016-04-23 LAB — CBC WITH DIFFERENTIAL/PLATELET
BASOS ABS: 0 10*3/uL (ref 0.0–0.1)
Basophils Relative: 0 %
EOS ABS: 0.1 10*3/uL (ref 0.0–0.7)
EOS PCT: 1 %
HCT: 50.7 % (ref 39.0–52.0)
Hemoglobin: 16.6 g/dL (ref 13.0–17.0)
LYMPHS PCT: 14 %
Lymphs Abs: 1.3 10*3/uL (ref 0.7–4.0)
MCH: 34 pg (ref 26.0–34.0)
MCHC: 32.7 g/dL (ref 30.0–36.0)
MCV: 103.9 fL — AB (ref 78.0–100.0)
MONO ABS: 0.7 10*3/uL (ref 0.1–1.0)
Monocytes Relative: 8 %
Neutro Abs: 7 10*3/uL (ref 1.7–7.7)
Neutrophils Relative %: 77 %
PLATELETS: 159 10*3/uL (ref 150–400)
RBC: 4.88 MIL/uL (ref 4.22–5.81)
RDW: 14.7 % (ref 11.5–15.5)
WBC: 9.1 10*3/uL (ref 4.0–10.5)

## 2016-04-23 LAB — DIGOXIN LEVEL: Digoxin Level: 2.3 ng/mL — ABNORMAL HIGH (ref 0.8–2.0)

## 2016-04-23 LAB — BRAIN NATRIURETIC PEPTIDE: B NATRIURETIC PEPTIDE 5: 1051 pg/mL — AB (ref 0.0–100.0)

## 2016-04-23 LAB — TROPONIN I: Troponin I: 0.06 ng/mL (ref <0.03)

## 2016-04-23 MED ORDER — IPRATROPIUM-ALBUTEROL 0.5-2.5 (3) MG/3ML IN SOLN
3.0000 mL | Freq: Once | RESPIRATORY_TRACT | Status: AC
  Administered 2016-04-23: 3 mL via RESPIRATORY_TRACT
  Filled 2016-04-23: qty 3

## 2016-04-23 MED ORDER — FUROSEMIDE 10 MG/ML IJ SOLN
60.0000 mg | Freq: Once | INTRAMUSCULAR | Status: AC
Start: 2016-04-23 — End: 2016-04-23
  Administered 2016-04-23: 60 mg via INTRAVENOUS
  Filled 2016-04-23: qty 6

## 2016-04-23 MED ORDER — NITROGLYCERIN 2 % TD OINT
1.0000 [in_us] | TOPICAL_OINTMENT | Freq: Once | TRANSDERMAL | Status: AC
  Administered 2016-04-23: 1 [in_us] via TOPICAL
  Filled 2016-04-23: qty 1

## 2016-04-23 MED ORDER — ALBUTEROL SULFATE (2.5 MG/3ML) 0.083% IN NEBU
2.5000 mg | INHALATION_SOLUTION | Freq: Once | RESPIRATORY_TRACT | Status: AC
  Administered 2016-04-23: 2.5 mg via RESPIRATORY_TRACT
  Filled 2016-04-23: qty 3

## 2016-04-23 NOTE — ED Notes (Signed)
Pt transported to xray 

## 2016-04-23 NOTE — ED Notes (Addendum)
CRITICAL VALUE ALERT  Critical value received: troponin-0.06  Date of notification:  04/23/16  Time of notification:  2318  Critical value read back:Yes.    Nurse who received alert:  Meridee Score, RN  MD notified (1st page):  2318  Time of first page:  2318  MD notified (2nd page):  Time of second page:  Responding MD: Burgess Amor, PA Time MD responded:  [phone]

## 2016-04-23 NOTE — ED Triage Notes (Signed)
Pt is short of breath, felt like he was going to pass out, He was outside checking his water.

## 2016-04-23 NOTE — ED Provider Notes (Signed)
[facility] DEPT Provider Note   CSN: [id number] Arrival date & time: 04/23/16  2136     History   Chief Complaint No chief complaint on file.   HPI [name] is a 82 y.o. male with a history of CAD and prior MI, cardiomyopathy with EF 25-30%, COPD and chronic afib was outdoors for approximately 5 minutes checking on a electric line when he became increasingly short of breath and felt dizzy like he was going to pass out.  He endorses sob at baseline, is not on home oxygen. He takes lasix 60 mg qam and 20 mg qhs but forgot to take today.  He had no chest pain, diaphoresis, nausea or vomiting during the episode.  He has had no treatment prior to arrival.  He denies any recent illnesses, colds, flu like symptoms or fever. He does report orthopnea and family states he needs a sleep study so he can get a cpap machine again.  The history is provided by the patient, the spouse and a relative.    Past Medical History:  Diagnosis Date  . Arteriosclerotic cardiovascular disease (ASCVD)   . At risk for sudden cardiac death 15-Sep-2012  . Atrial fibrillation (HCC)    Onset in 2012  . Atrial flutter (HCC)   . Bleeding in brain due to brain aneurysm [facility]) October 06 2014  . CHF (congestive heart failure) (HCC)   . Chronic anticoagulation 2012   2012  . COPD (chronic obstructive pulmonary disease) (HCC)   . DJD (degenerative joint disease)   . Gout   . Hyperlipidemia   . ICD (implantable cardiac defibrillator) in place   . Ischemic cardiomyopathy   . Kidney stone    "just once" (08/17/2012)  . Myocardial infarction 1998  . NICM (nonischemic cardiomyopathy), EF 25-30% 09-15-2012  . Obstructive sleep apnea    "went away when I lost a bunch of weight" (08/17/2012)  . Permanent atrial fibrillation (HCC) 02/15/2011   Initial onset in 03/2011 with rapid ventricular response   . S/P ICD (internal cardiac defibrillator) procedure, 08/17/12, [employer] 15-Sep-2012   [employer]  . Type  II diabetes mellitus [facility])     Patient Active Problem List   Diagnosis Date Noted  . Seizures (HCC) 04/03/2016  . Acute on chronic combined systolic and diastolic CHF (congestive heart failure) (HCC) 02/17/2015  . Chest pain 10/21/2014  . Transient alteration of awareness   . Systolic CHF, chronic (HCC)   . Subarachnoid hemorrhage (HCC)   . Cerebral thrombosis with cerebral infarction (HCC) 10/17/2014  . Seizure-like activity (HCC) 10/16/2014  . Syncope   . Subdural hematoma (HCC)   . Syncope and collapse   . Chronic systolic heart failure (HCC)   . Elevated troponin   . Other emphysema (HCC)   . Diabetes type 2, uncontrolled (HCC)   . HLD (hyperlipidemia)   . ACS (acute coronary syndrome) (HCC) 04/05/2014  . Sepsis (HCC) 04/05/2014  . Coronary artery disease 01/05/2014  . Cardiomyopathy, ischemic 03/26/2013  . Obstructive sleep apnea 03/26/2013  . Chronic combined systolic and diastolic CHF, NYHA class 2 (HCC) 09/24/2012  . At risk for sudden cardiac death 09/15/12  . S/P ICD (internal cardiac defibrillator) procedure, 08/17/12, [employer] implanted 15-Sep-2012  . Fall 02/28/2012  . Traumatic subarachnoid hemorrhage (HCC) 02/28/2012  . Multiple fractures of ribs of left side 02/28/2012  . Hyperkalemia 06/25/2011  . Ureterolithiasis 06/25/2011  . Laboratory test 04/29/2011  . COPD (chronic obstructive pulmonary disease) (HCC)   .  Chronic anticoagulation, coumadin   . Permanent atrial fibrillation (HCC) 02/15/2011  . DIABETES MELLITUS, TYPE II 10/07/2008  . OBSTRUCTIVE SLEEP APNEA 10/07/2008  . Arteriosclerotic cardiovascular disease (ASCVD) 10/07/2008    Past Surgical History:  Procedure Laterality Date  . CARDIAC CATHETERIZATION  02/2003  . CARDIAC DEFIBRILLATOR PLACEMENT  08/17/2012   [employer]  . CATARACT EXTRACTION W/ INTRAOCULAR LENS  IMPLANT, BILATERAL Bilateral ~ 2011  . CHOLECYSTECTOMY  2009  . CORONARY ANGIOPLASTY WITH STENT PLACEMENT  07/14/1996   "1"  (08/17/2012)  . CYSTOSCOPY/RETROGRADE/URETEROSCOPY  06/28/2011   Procedure: CYSTOSCOPY/RETROGRADE/URETEROSCOPY;  Surgeon: Ky Barban, MD;  Location: [facility] ORS;  Service: Urology;  Laterality: Right;  . IMPLANTABLE CARDIOVERTER DEFIBRILLATOR IMPLANT N/A 08/17/2012   Procedure: IMPLANTABLE CARDIOVERTER DEFIBRILLATOR IMPLANT;  Surgeon: Thurmon Fair, MD;  Location: [facility] CATH [facility];  Service: Cardiovascular;  Laterality: N/A;  . KNEE ARTHROPLASTY Left 1978   "tendon & cartilege repair" (08/17/2012)  . Myoview perfusion scan  06/02/2012   low risk, extensive scar entire LAD & RCA territory  . s/p icd  08/2012   [employer]  . STONE EXTRACTION WITH BASKET  06/28/2011   Procedure: STONE EXTRACTION WITH BASKET;  Surgeon: Ky Barban, MD;  Location: [facility] ORS;  Service: Urology;  Laterality: Right;  specimen given to family per MD  . US ECHOCARDIOGRAPHY  07/08/2012   EF <20%,mild MR,TR,LA severely dilated  . VASECTOMY  ~ 1964       Home Medications    Prior to Admission medications   Medication Sig Start Date End Date Taking? Authorizing Provider  aspirin EC 81 MG tablet Take 81 mg by mouth daily.   Yes Historical Provider, MD  Cholecalciferol (VITAMIN D3) 5000 units CAPS Take 1 capsule by mouth daily.   Yes Historical Provider, MD  digoxin (LANOXIN) 0.25 MG tablet Take 1 tablet (0.25 mg total) by mouth daily. 01/15/16 12/13/18 Yes Mihai Croitoru, MD  furosemide (LASIX) 40 MG tablet Take 1.5 tablets in the morning and 1/2 tablet at 3 pm Patient taking differently: Take 20-60 mg by mouth 2 (two) times daily. Take 1.5 tablets in the morning and 1/2 tablet at 3 pm 02/20/16  Yes Lennette Bihari, MD  lamoTRIgine (LAMICTAL) 25 MG tablet 1 tablet twice a day for the first week 2 tablets twice a day for the second week 3 tablets twice a day for the third week 4 tablets twice a day for the fourth week  After finish titration with small dose of lamotrigine 25 mg, change to lamotrigine 100 mg twice a  day Patient taking differently: Take by mouth See admin instructions. 1 tablet twice a day for the first week 2 tablets twice a day for the second week 3 tablets twice a day for the third week 4 tablets twice a day for the fourth week  After finish titration with small dose of lamotrigine 25 mg, change to lamotrigine 100 mg twice a day 04/03/16  Yes Levert Feinstein, MD  magnesium oxide (MAG-OX) 400 MG tablet Take 1 tablet (400 mg total) by mouth 2 (two) times daily. 01/15/16  Yes Mihai Croitoru, MD  metFORMIN (GLUCOPHAGE) 500 MG tablet Take 500 mg by mouth 2 (two) times daily with a meal.    Yes Historical Provider, MD  potassium chloride (K-DUR) 10 MEQ tablet Take 1 tablet (10 mEq total) by mouth daily. 01/15/16  Yes Mihai Croitoru, MD  pravastatin (PRAVACHOL) 40 MG tablet Take 1 tablet (40 mg total) by mouth daily. 01/15/16  Yes Mihai Croitoru,  MD  cephALEXin (KEFLEX) 500 MG capsule Take 1 capsule (500 mg total) by mouth 2 (two) times daily. Patient not taking: Reported on 04/23/2016 02/20/16   Lennette Bihari, MD  lamoTRIgine (LAMICTAL) 100 MG tablet Take 1 tablet (100 mg total) by mouth 2 (two) times daily. 04/03/16   Levert Feinstein, MD    Family History Family History  Problem Relation Age of Onset  . Heart failure Mother   . Heart failure Father     Social History Social History  Substance Use Topics  . Smoking status: Former Smoker    Packs/day: 2.00    Years: 40.00    Types: Cigarettes    Quit date: 04/08/1992  . Smokeless tobacco: Former Neurosurgeon  . Alcohol use No     Comment: 08/17/2012 "quit drinking in 1983"     Allergies   Lipitor [atorvastatin]; Procaine hcl; and Tramadol   Review of Systems Review of Systems  Constitutional: Negative for fever.  HENT: Negative for congestion and sore throat.   Eyes: Negative.   Respiratory: Positive for chest tightness and shortness of breath. Negative for cough and wheezing.   Cardiovascular: Positive for leg swelling. Negative for chest pain  and palpitations.  Gastrointestinal: Negative for abdominal pain and nausea.  Genitourinary: Negative.   Musculoskeletal: Negative for arthralgias, joint swelling and neck pain.  Skin: Negative.  Negative for rash and wound.  Neurological: Negative for dizziness, weakness, light-headedness, numbness and headaches.  Psychiatric/Behavioral: Negative.      Physical Exam Updated Vital Signs BP 140/77   Pulse 98   Resp 18   Ht 5\' 4"  (1.626 m)   Wt 74.8 kg   SpO2 97%   BMI 28.32 kg/m   Physical Exam  Constitutional: He appears well-developed and well-nourished.  HENT:  Head: Normocephalic and atraumatic.  Eyes: Conjunctivae are normal.  Neck: Normal range of motion.  Cardiovascular: Normal rate, regular rhythm, normal heart sounds and intact distal pulses.   2+ edema to bilateral knees.  Pulmonary/Chest: Effort normal. He has decreased breath sounds. He has no wheezes. He has no rhonchi. He has rales in the right lower field and the left lower field. He exhibits no tenderness.  Decreased breath sounds all fields.  Abdominal: Soft. Bowel sounds are normal. There is no tenderness. There is no guarding.  Musculoskeletal: Normal range of motion.  Neurological: He is alert.  Skin: Skin is warm and dry.  Psychiatric: He has a normal mood and affect.  Nursing note and vitals reviewed.    ED Treatments / Results  Labs (all labs ordered are listed, but only abnormal results are displayed) Labs Reviewed  BASIC METABOLIC PANEL - Abnormal; Notable for the following:       Result Value   Chloride 100 (*)    Glucose, Bld 170 (*)    BUN 22 (*)    Creatinine, Ser 1.52 (*)    GFR calc non Af Amer 42 (*)    GFR calc Af Amer 48 (*)    All other components within normal limits  BRAIN NATRIURETIC PEPTIDE - Abnormal; Notable for the following:    B Natriuretic Peptide 1,051.0 (*)    All other components within normal limits  CBC WITH DIFFERENTIAL/PLATELET - Abnormal; Notable for the  following:    MCV 103.9 (*)    All other components within normal limits  TROPONIN I - Abnormal; Notable for the following:    Troponin I 0.06 (*)    All other components within normal  limits  DIGOXIN LEVEL - Abnormal; Notable for the following:    Digoxin Level 2.3 (*)    All other components within normal limits  TROPONIN I    EKG  EKG Interpretation  Date/Time:  Tuesday April 23 2016 22:01:32 EST Ventricular Rate:  66 PR Interval:    QRS Duration: 163 QT Interval:  397 QTC Calculation: 416 R Axis:   154 Text Interpretation:  Atrial fibrillation Nonspecific intraventricular conduction delay Repol abnrm suggests ischemia, diffuse leads poor baseilne. needs repeat Confirmed by [facility] MD, Barbara Cower [phone]) on 04/23/2016 10:11:20 PM       Radiology Dg Chest 2 View  Result Date: 04/23/2016 CLINICAL DATA:  Pt c/o sudden onset SOB and dizziness tonight. HX diabetes, ICD, A-fib, MI, COPD, CHF, former smoker EXAM: CHEST  2 VIEW COMPARISON:  Chest x-rays dated 12/22/2015 and 11/13/2014. FINDINGS: Cardiomegaly is stable. Left chest wall pacemaker with single lead grossly stable in position. Atherosclerotic changes again noted at the aortic arch. Lungs are hyperexpanded. Again noted is mild blunting at each costophrenic angle. Lungs are otherwise clear. No evidence of pneumonia. No pneumothorax. No acute or suspicious osseous finding. IMPRESSION: 1. Hyperexpanded lungs suggesting COPD/emphysema. 2. Blunting at each costophrenic angle, stable compared to previous exams, compatible with chronic pleural thickening and/or small pleural effusions. 3. Cardiomegaly.  No evidence of active CHF. 4. No evidence of pneumonia. Electronically Signed   By: Bary [name] M.D.   On: 04/23/2016 22:29    Procedures Procedures (including critical care time)  Medications Ordered in ED Medications  ipratropium-albuterol (DUONEB) 0.5-2.5 (3) MG/3ML nebulizer solution 3 mL (3 mLs Nebulization Given 04/23/16 2255)    albuterol (PROVENTIL) (2.5 MG/3ML) 0.083% nebulizer solution 2.5 mg (2.5 mg Nebulization Given 04/23/16 2255)  furosemide (LASIX) injection 60 mg (60 mg Intravenous Given 04/23/16 2328)  nitroGLYCERIN (NITROGLYN) 2 % ointment 1 inch (1 inch Topical Given 04/23/16 2338)     Initial Impression / Assessment and Plan / ED Course  I have reviewed the triage vital signs and the nursing notes.  Pertinent labs & imaging results that were available during my care of the patient were reviewed by me and considered in my medical decision making (see chart for details).  Clinical Course     Pt with acute exacerbation of chf.  He was given IV lasix, nitroglycerin paste applied for diuresis.  Initial troponin elevated at 0.06 which is consistent with past troponins.  3 hour delta trop ordered.   Discussed with Dr. Wilkie Aye who will follow and dispo patient. If delta trops stable and patient adequately diureses,  Reasonable for dc home.    Final Clinical Impressions(s) / ED Diagnoses   Final diagnoses:  Acute on chronic combined systolic and diastolic congestive heart failure (HCC)  SOB (shortness of breath)    New Prescriptions New Prescriptions   No medications on file     Burgess Amor, PA-C 04/24/16 0103    Marily Memos, MD 04/24/16 1226

## 2016-04-24 ENCOUNTER — Telehealth: Payer: Self-pay | Admitting: Nurse Practitioner

## 2016-04-24 LAB — TROPONIN I: TROPONIN I: 0.05 ng/mL — AB (ref <0.03)

## 2016-04-24 NOTE — ED Notes (Signed)
Pt spouse given water per request

## 2016-04-24 NOTE — Telephone Encounter (Signed)
Received phone call today from patient's wife - [name].   Notes that [name] was in the ER at [facility] last night with "fluid all over". She says he is some better today but still has DOE.   Chronically elevated troponin noted. Elevated digoxin level noted.   They were advised by the staff at [facility] for him to see [name] in the next few days and she is calling to request an appointment.   I explained that the office is closed today and that the office will call her tomorrow to arrange an appointment with [name] or NP/PA on his team. Advised her to cut Digoxin back to 1/2 a tablet at 0.125 mg daily. Advised to call EMS if has recurrence of symptoms.   She agrees with this plan.   Rosalio Macadamia, RN, ANP-C [facility] [address] [place], [place]  [address] [phone]

## 2016-04-24 NOTE — ED Provider Notes (Signed)
Medical screening examination/treatment/procedure(s) were conducted as a shared visit with non-physician practitioner(s) and myself.  I personally evaluated the patient during the encounter.  82 yo M here with progressively worsening sob, worse with exertion. Also with le edema as well. No fever. Has orthopnea.  On my exam has faint crackles. No hypoxia or respiratory distress. 1+ pitting edema bilaterally to mid shin.  ecg ok, poor baseline, needs repeat. BNP and troponin elevated c/w CHF exacerbation.  Plan for iv lasix, after diuresis, ambulate and ensure improvement. If so, dc on double home lasix with cards/pcp follow up in 3-4 days for reevaluation.    EKG Interpretation  Date/Time:  Tuesday April 23 2016 22:01:32 EST Ventricular Rate:  66 PR Interval:    QRS Duration: 163 QT Interval:  397 QTC Calculation: 416 R Axis:   154 Text Interpretation:  Atrial fibrillation Nonspecific intraventricular conduction delay Repol abnrm suggests ischemia, diffuse leads poor baseilne. needs repeat Confirmed by [facility] MD, Bartow Zylstra [phone]) on 04/23/2016 10:11:20 PM         Marily Memos, MD 04/24/16 [phone]

## 2016-04-24 NOTE — ED Notes (Signed)
Ambulated pt around nurses station. Pulse ox fluctuated between 93-100% during ambulation. Pt remained talkative and said he was not tired upon arrival to room.

## 2016-04-24 NOTE — ED Notes (Signed)
Pt family given cup of ice per request. Pt given non skid socks per request. Pt given another warm blanket per request. Pt given a cup of water per request.

## 2016-04-24 NOTE — Discharge Instructions (Signed)
Make sure to take her daily Lasix as prescribed. Follow-up with your cardiologist and primary physician in 2 days for recheck.

## 2016-04-24 NOTE — ED Provider Notes (Signed)
Patient appropriately diuresing. Repeat troponin 0.05. Patient able to angulate and maintain pulse ox greater than 93%. Patient encouraged follow-up with primary physician and cardiologist. Take daily Lasix as prescribed.      [name], MD 04/24/16 [phone]

## 2016-04-26 NOTE — Telephone Encounter (Signed)
Tried calling patient to schedule follow up, no answer and unable to leave message to call back

## 2016-04-26 NOTE — Telephone Encounter (Signed)
Patient scheduled to see Franky Macho, [place] 04/30/16

## 2016-04-29 NOTE — Telephone Encounter (Signed)
Acknowledged.

## 2016-04-30 ENCOUNTER — Ambulatory Visit: Payer: Medicare HMO | Admitting: Cardiology

## 2016-04-30 ENCOUNTER — Emergency Department (HOSPITAL_COMMUNITY): Payer: MEDICARE

## 2016-04-30 ENCOUNTER — Inpatient Hospital Stay (HOSPITAL_COMMUNITY)
Admission: EM | Admit: 2016-04-30 | Discharge: 2016-05-02 | DRG: 291 | Disposition: A | Discharge status: Home / Self Care | Payer: MEDICARE | Attending: Family Medicine | Admitting: Family Medicine

## 2016-04-30 ENCOUNTER — Encounter (HOSPITAL_COMMUNITY): Payer: Self-pay | Admitting: Emergency Medicine

## 2016-04-30 ENCOUNTER — Telehealth: Payer: Self-pay | Admitting: Cardiovascular Disease

## 2016-04-30 DIAGNOSIS — J449 Chronic obstructive pulmonary disease, unspecified: Secondary | ICD-10-CM | POA: Diagnosis not present

## 2016-04-30 DIAGNOSIS — Z7984 Long term (current) use of oral hypoglycemic drugs: Secondary | ICD-10-CM | POA: Diagnosis not present

## 2016-04-30 DIAGNOSIS — G40909 Epilepsy, unspecified, not intractable, without status epilepticus: Secondary | ICD-10-CM | POA: Diagnosis not present

## 2016-04-30 DIAGNOSIS — Z87891 Personal history of nicotine dependence: Secondary | ICD-10-CM | POA: Diagnosis not present

## 2016-04-30 DIAGNOSIS — I482 Chronic atrial fibrillation: Secondary | ICD-10-CM | POA: Diagnosis not present

## 2016-04-30 DIAGNOSIS — I255 Ischemic cardiomyopathy: Secondary | ICD-10-CM | POA: Diagnosis not present

## 2016-04-30 DIAGNOSIS — Z8249 Family history of ischemic heart disease and other diseases of the circulatory system: Secondary | ICD-10-CM

## 2016-04-30 DIAGNOSIS — I248 Other forms of acute ischemic heart disease: Secondary | ICD-10-CM | POA: Diagnosis not present

## 2016-04-30 DIAGNOSIS — E1165 Type 2 diabetes mellitus with hyperglycemia: Secondary | ICD-10-CM | POA: Diagnosis not present

## 2016-04-30 DIAGNOSIS — E785 Hyperlipidemia, unspecified: Secondary | ICD-10-CM | POA: Diagnosis not present

## 2016-04-30 DIAGNOSIS — E876 Hypokalemia: Secondary | ICD-10-CM | POA: Diagnosis not present

## 2016-04-30 DIAGNOSIS — N183 Chronic kidney disease, stage 3 (moderate): Secondary | ICD-10-CM | POA: Diagnosis not present

## 2016-04-30 DIAGNOSIS — Z7982 Long term (current) use of aspirin: Secondary | ICD-10-CM

## 2016-04-30 DIAGNOSIS — I130 Hypertensive heart and chronic kidney disease with heart failure and stage 1 through stage 4 chronic kidney disease, or unspecified chronic kidney disease: Secondary | ICD-10-CM | POA: Diagnosis not present

## 2016-04-30 DIAGNOSIS — I252 Old myocardial infarction: Secondary | ICD-10-CM

## 2016-04-30 DIAGNOSIS — IMO0002 Reserved for concepts with insufficient information to code with codable children: Secondary | ICD-10-CM | POA: Diagnosis present

## 2016-04-30 DIAGNOSIS — I5043 Acute on chronic combined systolic (congestive) and diastolic (congestive) heart failure: Secondary | ICD-10-CM | POA: Diagnosis present

## 2016-04-30 DIAGNOSIS — I2510 Atherosclerotic heart disease of native coronary artery without angina pectoris: Secondary | ICD-10-CM | POA: Diagnosis not present

## 2016-04-30 DIAGNOSIS — Z95810 Presence of automatic (implantable) cardiac defibrillator: Secondary | ICD-10-CM | POA: Diagnosis not present

## 2016-04-30 DIAGNOSIS — Z955 Presence of coronary angioplasty implant and graft: Secondary | ICD-10-CM

## 2016-04-30 DIAGNOSIS — E1122 Type 2 diabetes mellitus with diabetic chronic kidney disease: Secondary | ICD-10-CM | POA: Diagnosis not present

## 2016-04-30 DIAGNOSIS — I493 Ventricular premature depolarization: Secondary | ICD-10-CM | POA: Diagnosis present

## 2016-04-30 DIAGNOSIS — R0602 Shortness of breath: Secondary | ICD-10-CM | POA: Diagnosis present

## 2016-04-30 DIAGNOSIS — I4821 Permanent atrial fibrillation: Secondary | ICD-10-CM | POA: Diagnosis present

## 2016-04-30 DIAGNOSIS — I5023 Acute on chronic systolic (congestive) heart failure: Secondary | ICD-10-CM

## 2016-04-30 DIAGNOSIS — R569 Unspecified convulsions: Secondary | ICD-10-CM

## 2016-04-30 DIAGNOSIS — Z79899 Other long term (current) drug therapy: Secondary | ICD-10-CM

## 2016-04-30 DIAGNOSIS — Z9049 Acquired absence of other specified parts of digestive tract: Secondary | ICD-10-CM

## 2016-04-30 HISTORY — DX: Atherosclerotic heart disease of native coronary artery without angina pectoris: I25.10

## 2016-04-30 LAB — CBC
HCT: 44.7 % (ref 39.0–52.0)
Hemoglobin: 15 g/dL (ref 13.0–17.0)
MCH: 34 pg (ref 26.0–34.0)
MCHC: 33.6 g/dL (ref 30.0–36.0)
MCV: 101.4 fL — ABNORMAL HIGH (ref 78.0–100.0)
PLATELETS: 137 10*3/uL — AB (ref 150–400)
RBC: 4.41 MIL/uL (ref 4.22–5.81)
RDW: 14.7 % (ref 11.5–15.5)
WBC: 8.1 10*3/uL (ref 4.0–10.5)

## 2016-04-30 LAB — I-STAT TROPONIN, ED: TROPONIN I, POC: 0.06 ng/mL (ref 0.00–0.08)

## 2016-04-30 LAB — BASIC METABOLIC PANEL
ANION GAP: 13 (ref 5–15)
BUN: 20 mg/dL (ref 6–20)
CO2: 23 mmol/L (ref 22–32)
CREATININE: 1.44 mg/dL — AB (ref 0.61–1.24)
Calcium: 10.1 mg/dL (ref 8.9–10.3)
Chloride: 99 mmol/L — ABNORMAL LOW (ref 101–111)
GFR, EST AFRICAN AMERICAN: 52 mL/min — AB (ref >60)
GFR, EST NON AFRICAN AMERICAN: 45 mL/min — AB (ref >60)
GLUCOSE: 123 mg/dL — AB (ref 65–99)
Potassium: 4.2 mmol/L (ref 3.5–5.1)
Sodium: 135 mmol/L (ref 135–145)

## 2016-04-30 LAB — DIGOXIN LEVEL: Digoxin Level: 1.7 ng/mL (ref 0.8–2.0)

## 2016-04-30 LAB — BRAIN NATRIURETIC PEPTIDE: B Natriuretic Peptide: 951.5 pg/mL — ABNORMAL HIGH (ref 0.0–100.0)

## 2016-04-30 MED ORDER — ENOXAPARIN SODIUM 40 MG/0.4ML ~~LOC~~ SOLN
40.0000 mg | SUBCUTANEOUS | Status: DC
  Administered 2016-04-30: 40 mg via SUBCUTANEOUS
  Filled 2016-04-30 (×2): qty 0.4

## 2016-04-30 MED ORDER — ACETAMINOPHEN 325 MG PO TABS: 650.0000 mg | ORAL_TABLET | ORAL | Status: DC | PRN

## 2016-04-30 MED ORDER — LAMOTRIGINE 25 MG PO TABS
75.0000 mg | ORAL_TABLET | Freq: Every day | ORAL | Status: DC
  Filled 2016-04-30: qty 3

## 2016-04-30 MED ORDER — METFORMIN HCL 500 MG PO TABS
500.0000 mg | ORAL_TABLET | Freq: Two times a day (BID) | ORAL | Status: DC
  Administered 2016-05-01: 500 mg via ORAL
  Filled 2016-04-30: qty 1

## 2016-04-30 MED ORDER — VITAMIN D 1000 UNITS PO TABS
1000.0000 [IU] | ORAL_TABLET | Freq: Every day | ORAL | Status: DC
  Administered 2016-05-01 – 2016-05-02 (×2): 1000 [IU] via ORAL
  Filled 2016-04-30 (×2): qty 1

## 2016-04-30 MED ORDER — DIGOXIN 250 MCG PO TABS
0.2500 mg | ORAL_TABLET | Freq: Every day | ORAL | Status: DC
  Administered 2016-05-01 – 2016-05-02 (×2): 0.25 mg via ORAL
  Filled 2016-04-30 (×2): qty 1

## 2016-04-30 MED ORDER — ASPIRIN EC 81 MG PO TBEC
81.0000 mg | DELAYED_RELEASE_TABLET | Freq: Every day | ORAL | Status: DC
  Administered 2016-05-01 – 2016-05-02 (×2): 81 mg via ORAL
  Filled 2016-04-30 (×2): qty 1

## 2016-04-30 MED ORDER — FUROSEMIDE 10 MG/ML IJ SOLN
80.0000 mg | Freq: Once | INTRAMUSCULAR | Status: AC
  Administered 2016-04-30: 80 mg via INTRAVENOUS
  Filled 2016-04-30: qty 8

## 2016-04-30 MED ORDER — SODIUM CHLORIDE 0.9% FLUSH: 3.0000 mL | INTRAVENOUS | Status: DC | PRN

## 2016-04-30 MED ORDER — MAGNESIUM OXIDE 400 (241.3 MG) MG PO TABS
400.0000 mg | ORAL_TABLET | Freq: Two times a day (BID) | ORAL | Status: DC
  Administered 2016-05-01 – 2016-05-02 (×3): 400 mg via ORAL
  Filled 2016-04-30 (×3): qty 1

## 2016-04-30 MED ORDER — LAMOTRIGINE 25 MG PO TABS
75.0000 mg | ORAL_TABLET | Freq: Two times a day (BID) | ORAL | Status: DC
  Administered 2016-04-30 – 2016-05-02 (×4): 75 mg via ORAL
  Filled 2016-04-30 (×4): qty 3

## 2016-04-30 MED ORDER — ONDANSETRON HCL 4 MG/2ML IJ SOLN: 4.0000 mg | Freq: Four times a day (QID) | INTRAMUSCULAR | Status: DC | PRN

## 2016-04-30 MED ORDER — SODIUM CHLORIDE 0.9 % IV SOLN: 250.0000 mL | INTRAVENOUS | Status: DC | PRN

## 2016-04-30 MED ORDER — SODIUM CHLORIDE 0.9% FLUSH
3.0000 mL | Freq: Two times a day (BID) | INTRAVENOUS | Status: DC
  Administered 2016-04-30 – 2016-05-01 (×3): 3 mL via INTRAVENOUS

## 2016-04-30 MED ORDER — FUROSEMIDE 10 MG/ML IJ SOLN
80.0000 mg | Freq: Two times a day (BID) | INTRAMUSCULAR | Status: DC
  Administered 2016-05-01: 80 mg via INTRAVENOUS
  Filled 2016-04-30: qty 8

## 2016-04-30 MED ORDER — PRAVASTATIN SODIUM 40 MG PO TABS
40.0000 mg | ORAL_TABLET | Freq: Every day | ORAL | Status: DC
  Administered 2016-04-30 – 2016-05-01 (×2): 40 mg via ORAL
  Filled 2016-04-30 (×2): qty 1

## 2016-04-30 NOTE — Telephone Encounter (Signed)
New Message  Pt c/o Shortness Of Breath: STAT if SOB developed within the last 24 hours or pt is noticeably SOB on the phone  1. Are you currently SOB (can you hear that pt is SOB on the phone)? No, daughter on the line but daughter voiced yes.  2. How long have you been experiencing SOB? A week ago  3. Are you SOB when sitting or when up moving around? Both  4. Are you currently experiencing any other symptoms? No

## 2016-04-30 NOTE — H&P (Signed)
History and Physical    [name] EAV:[id number] DOB: Dec 25, 1934 DOA: 04/30/2016   PCP: [name], MD Chief Complaint:  Chief Complaint  Patient presents with  . Shortness of Breath    HPI: [name] is a 82 y.o. male with medical history significant of CAD (s/p DES to LAD in 1999, DES to RCA in 2004, NST in 2014 showing scar along LAD territory and borderline ischemia along RCA), ischemic cardiomyopathy (EF 25-25% in 10/2014, ICD placed in 08/2012), HTN, HLD, seizure disorder, and permanent atrial fibrillation (not on anticoagulation secondary to history of intracranial hemorrhage) who presents to [facility] ED on 04/30/2016 for worsening shortness of breath.   Initially seen in ER earlier this week for CHF, attempted outpatient treatment which unfortunately didn't get good diuresis it seems.  Patient with worsening dyspnea, peripheral edema, orthopnea, and weight gain.  Onset a couple of weeks ago.  Progressively worsening.  Not improved with PO lasix as outpatient.  ED Course: Weight today 165 lbs.  Small pleural effusions on CXR.  BNP 900, patient in A.Fib (chronically).  Creat 1.44, trop 0.06 (was 0.06 and 0.05 during previous ER visit earlier this week).  Review of Systems: As per HPI otherwise 10 point review of systems negative.    Past Medical History:  Diagnosis Date  . Arteriosclerotic cardiovascular disease (ASCVD)   . At risk for sudden cardiac death 08/22/2012  . Atrial fibrillation (HCC)    Onset in 2012  . Atrial flutter (HCC)   . Bleeding in brain due to brain aneurysm [facility]) October 06 2014  . CAD (coronary artery disease)    a. s/p DES to LAD in 1999 b. DES to RCA in 2004 c. NST in 2014 showing scar along LAD territory and borderline ischemia along RCA  . CHF (congestive heart failure) (HCC)   . Chronic anticoagulation 2012   2012  . COPD (chronic obstructive pulmonary disease) (HCC)   . DJD (degenerative joint disease)   . Gout   . Hyperlipidemia   .  ICD (implantable cardiac defibrillator) in place   . Ischemic cardiomyopathy   . Kidney stone    "just once" (08/17/2012)  . Myocardial infarction 1998  . NICM (nonischemic cardiomyopathy), EF 25-30% 2012-08-22  . Obstructive sleep apnea    "went away when I lost a bunch of weight" (08/17/2012)  . Permanent atrial fibrillation (HCC) 02/15/2011   Initial onset in 03/2011 with rapid ventricular response   . S/P ICD (internal cardiac defibrillator) procedure, 08/17/12, [employer] Aug 22, 2012   [employer]  . Type II diabetes mellitus (HCC)     Past Surgical History:  Procedure Laterality Date  . CARDIAC CATHETERIZATION  02/2003  . CARDIAC DEFIBRILLATOR PLACEMENT  08/17/2012   [employer]  . CATARACT EXTRACTION W/ INTRAOCULAR LENS  IMPLANT, BILATERAL Bilateral ~ 2011  . CHOLECYSTECTOMY  2009  . CORONARY ANGIOPLASTY WITH STENT PLACEMENT  07/14/1996   "1" (08/17/2012)  . CYSTOSCOPY/RETROGRADE/URETEROSCOPY  06/28/2011   Procedure: CYSTOSCOPY/RETROGRADE/URETEROSCOPY;  Surgeon: Ky Barban, MD;  Location: [facility];  Service: Urology;  Laterality: Right;  . IMPLANTABLE CARDIOVERTER DEFIBRILLATOR IMPLANT N/A 08/17/2012   Procedure: IMPLANTABLE CARDIOVERTER DEFIBRILLATOR IMPLANT;  Surgeon: Thurmon Fair, MD;  Location: [facility] CATH LAB;  Service: Cardiovascular;  Laterality: N/A;  . KNEE ARTHROPLASTY Left 1978   "tendon & cartilege repair" (08/17/2012)  . Myoview perfusion scan  06/02/2012   low risk, extensive scar entire LAD & RCA territory  . s/p icd  08/2012   [employer]  .  STONE EXTRACTION WITH BASKET  06/28/2011   Procedure: STONE EXTRACTION WITH BASKET;  Surgeon: Ky Barban, MD;  Location: [facility] ORS;  Service: Urology;  Laterality: Right;  specimen given to family per MD  . US ECHOCARDIOGRAPHY  07/08/2012   EF <20%,mild MR,TR,LA severely dilated  . VASECTOMY  ~ 1964     reports that he quit smoking about 24 years ago. His smoking use included Cigarettes. He has a 80.00 pack-year  smoking history. He has quit using smokeless tobacco. He reports that he does not drink alcohol or use drugs.  Allergies  Allergen Reactions  . Novocain [Procaine] Nausea And Vomiting and Other (See Comments)    Passes out (also)  . Lipitor [Atorvastatin] Other (See Comments)    Myalgias    . Procaine Hcl Nausea And Vomiting  . Tramadol Itching and Nausea And Vomiting    Family History  Problem Relation Age of Onset  . Heart failure Mother   . Heart failure Father       Prior to Admission medications   Medication Sig Start Date End Date Taking? Authorizing Provider  aspirin EC 81 MG tablet Take 81 mg by mouth daily.   Yes Historical Provider, MD  Cholecalciferol (VITAMIN D3) 5000 units CAPS Take 1 capsule by mouth daily.   Yes Historical Provider, MD  digoxin (LANOXIN) 0.25 MG tablet Take 1 tablet (0.25 mg total) by mouth daily. 01/15/16 12/13/18 Yes Mihai Croitoru, MD  furosemide (LASIX) 40 MG tablet Take 1.5 tablets in the morning and 1/2 tablet at 3 pm Patient taking differently: Take 20-60 mg by mouth See admin instructions. 60 mg in the morning and 20 mg in the evening 02/20/16  Yes Lennette Bihari, MD  lamoTRIgine (LAMICTAL) 25 MG tablet 1 tablet twice a day for the first week 2 tablets twice a day for the second week 3 tablets twice a day for the third week 4 tablets twice a day for the fourth week  After finish titration with small dose of lamotrigine 25 mg, change to lamotrigine 100 mg twice a day Patient taking differently: Take 75 mg by mouth at bedtime.  04/03/16  Yes Levert Feinstein, MD  magnesium oxide (MAG-OX) 400 MG tablet Take 1 tablet (400 mg total) by mouth 2 (two) times daily. 01/15/16  Yes Mihai Croitoru, MD  metFORMIN (GLUCOPHAGE) 500 MG tablet Take 500 mg by mouth 2 (two) times daily with a meal.    Yes Historical Provider, MD  potassium chloride (K-DUR) 10 MEQ tablet Take 1 tablet (10 mEq total) by mouth daily. 01/15/16  Yes Mihai Croitoru, MD  pravastatin  (PRAVACHOL) 40 MG tablet Take 1 tablet (40 mg total) by mouth daily. 01/15/16  Yes Mihai Croitoru, MD  lamoTRIgine (LAMICTAL) 100 MG tablet Take 1 tablet (100 mg total) by mouth 2 (two) times daily. 04/03/16   Levert Feinstein, MD    Physical Exam: Vitals:   04/30/16 1323 04/30/16 1802 04/30/16 1850 04/30/16 1915  BP: 105/62 110/86 111/79 120/76  Pulse: 90 77 75 75  Resp: 20 18 23 26   Temp: 97.5 F (36.4 C)     TempSrc: Oral     SpO2: 100% 100% 100% 99%      Constitutional: NAD, calm, comfortable Eyes: PERRL, lids and conjunctivae normal ENMT: Mucous membranes are moist. Posterior pharynx clear of any exudate or lesions.Normal dentition.  Neck: normal, supple, no masses, no thyromegaly Respiratory: Decreased breath sounds at bases Cardiovascular: IRR, IRR, JVD to ears, 3+ pitting edema BLE.  Abdomen: no tenderness, no masses palpated. No hepatosplenomegaly. Bowel sounds positive.  Musculoskeletal: no clubbing / cyanosis. No joint deformity upper and lower extremities. Good ROM, no contractures. Normal muscle tone.  Skin: no rashes, lesions, ulcers. No induration Neurologic: CN 2-12 grossly intact. Sensation intact, DTR normal. Strength 5/5 in all 4.  Psychiatric: Normal judgment and insight. Alert and oriented x 3. Normal mood.    Labs on Admission: I have personally reviewed following labs and imaging studies  CBC:  Recent Labs Lab 04/23/16 2229 04/30/16 1309  WBC 9.1 8.1  NEUTROABS 7.0  --   HGB 16.6 15.0  HCT 50.7 44.7  MCV 103.9* 101.4*  PLT 159 137*   Basic Metabolic Panel:  Recent Labs Lab 04/23/16 2229 04/30/16 1309  NA 136 135  K 4.8 4.2  CL 100* 99*  CO2 26 23  GLUCOSE 170* 123*  BUN 22* 20  CREATININE 1.52* 1.44*  CALCIUM 10.0 10.1   GFR: Estimated Creatinine Clearance: 38.5 mL/min (by C-G formula based on SCr of 1.44 mg/dL (H)). Liver Function Tests: No results for input(s): AST, ALT, ALKPHOS, BILITOT, PROT, ALBUMIN in the last 168 hours. No results  for input(s): LIPASE, AMYLASE in the last 168 hours. No results for input(s): AMMONIA in the last 168 hours. Coagulation Profile: No results for input(s): INR, PROTIME in the last 168 hours. Cardiac Enzymes:  Recent Labs Lab 04/23/16 2229 04/24/16 0111  TROPONINI 0.06* 0.05*   BNP (last 3 results) No results for input(s): PROBNP in the last 8760 hours. HbA1C: No results for input(s): HGBA1C in the last 72 hours. CBG: No results for input(s): GLUCAP in the last 168 hours. Lipid Profile: No results for input(s): CHOL, HDL, LDLCALC, TRIG, CHOLHDL, LDLDIRECT in the last 72 hours. Thyroid Function Tests: No results for input(s): TSH, T4TOTAL, FREET4, T3FREE, THYROIDAB in the last 72 hours. Anemia Panel: No results for input(s): VITAMINB12, FOLATE, FERRITIN, TIBC, IRON, RETICCTPCT in the last 72 hours. Urine analysis:    Component Value Date/Time   COLORURINE YELLOW 01/05/2016 1933   APPEARANCEUR CLEAR 01/05/2016 1933   LABSPEC 1.010 01/05/2016 1933   PHURINE 6.0 01/05/2016 1933   GLUCOSEU NEGATIVE 01/05/2016 1933   HGBUR SMALL (A) 01/05/2016 1933   BILIRUBINUR NEGATIVE 01/05/2016 1933   KETONESUR NEGATIVE 01/05/2016 1933   PROTEINUR NEGATIVE 01/05/2016 1933   UROBILINOGEN 0.2 11/13/2014 0933   NITRITE NEGATIVE 01/05/2016 1933   LEUKOCYTESUR NEGATIVE 01/05/2016 1933   Sepsis Labs: @LABRCNTIP (procalcitonin:4,lacticidven:4) )No results found for this or any previous visit (from the past 240 hour(s)).   Radiological Exams on Admission: Dg Chest 2 View  Result Date: 04/30/2016 CLINICAL DATA:  Shortness of breath for 1 month EXAM: CHEST  2 VIEW COMPARISON:  04/23/2016 FINDINGS: Left single lead AICD remains in place with single lead tip in the right ventricle. Cardiomegaly. Lungs are clear. Small bilateral pleural effusions. No acute bony abnormality. IMPRESSION: Cardiomegaly.  Small effusions. Electronically Signed   By: Charlett Nose M.D.   On: 04/30/2016 13:48    EKG:  Independently reviewed.  Assessment/Plan Principal Problem:   Acute on chronic combined systolic and diastolic CHF (congestive heart failure) (HCC) Active Problems:   Permanent atrial fibrillation (HCC)   Cardiomyopathy, ischemic   Diabetes type 2, uncontrolled (HCC)   Seizures (HCC)    1. Acute on chronic combined CHF - 1. See cards consult note 2. CHF pathway 3. Lasix 80mg  IV BID 4. Intake and output 5. Daily weights 6. Tele monitor 7. No need for repeat  echo per cards 8. No ACEi or BB due to h/o hypotension with these. 2. Perm A.Fib - 1. Continue rate control with digoxin 2. Not on blood thinner due to h/o ICH 3. H/o Seizures - continue Lamictal at current home does of 75mg , will put this at BID 4. DM2 - continue metformin   DVT prophylaxis: Lovenox Code Status: Full Family Communication: Wife at bedside Consults called: Cards has seen patient Admission status: Admit to inpatient: failed outpatient diuresis, likely will take a couple days of diuresing inpatient.   Hillary Bow DO [facility] Pager [phone] from 7PM-7AM  If 7AM-7PM, please contact the day physician for the patient [email or web address] Password [facility]  04/30/2016, 7:49 PM

## 2016-04-30 NOTE — Telephone Encounter (Signed)
Spoke with daughter and she stated patient is having a lot of trouble breathing, thinks he needs oxygen, and is filling up with fluid Stated breathing is worse than yesterday and does not feel that he can wait until this afternoon to be seen, thinks he needs to go to ED Multiple times said he just could not breath.  Asked if she felt patient needed to go by ambulance and she stated yes Advised to call 911 and have patient go to ED Called Trish and left message

## 2016-04-30 NOTE — ED Notes (Signed)
Attempted to call report to [facility] on floor RN to call back for report unable to take at this time

## 2016-04-30 NOTE — ED Triage Notes (Addendum)
Pt states  He has been sob for a month has seen the dr but he doesn't like him, states he is to get a breathing machine, occa  has cp

## 2016-04-30 NOTE — Consult Note (Signed)
Cardiology Consult    Patient ID: [name] MRN: [id number], DOB/AGE: [birth year]   Admit date: 04/30/2016 Date of Consult: 04/30/2016  Primary Physician: [name], MD Reason for Consult: CHF Primary Cardiologist: [name]  Requesting Provider: Dr. Rhunette Croft   History of Present Illness    [name] is a 82 y.o. male with past medical history of CAD (s/p DES to LAD in 1999, DES to RCA in 2004, NST in 2014 showing scar along LAD territory and borderline ischemia along RCA), ischemic cardiomyopathy (EF 25-25% in 10/2014, ICD placed in 08/2012), HTN, HLD, seizure disorder, and permanent atrial fibrillation (not on anticoagulation secondary to history of intracranial hemorrhage) who presents to [facility] ED on 04/30/2016 for worsening shortness of breath.   Has been seen in the ER earlier this week for similar symptoms. BNP 1051. Troponin values flat at 0.06 and 0.05. Was treated with IV Lasix and encouraged to follow-up with Cardiology as an outpatient (had appointment scheduled today).   His daughter called the office today reporting worsening dyspnea and it was recommended he go to the ED for evaluation.   He reports worsening dyspnea with exertion, orthopnea, and lower extremity edema for the past several weeks. Weight in the 160's on home scales.   Labs today show a WBC of 8.1, Hgb 15.0, platelets 137. Creatinine 1.44 (baseline 1.2 - 1.3). Troponin 0.06. CXR shows cardiomegaly with small effusions. BNP pending. EKG shows atrial fibrillation, HR 69, with PVC's.   Past Medical History   Past Medical History:  Diagnosis Date  . Arteriosclerotic cardiovascular disease (ASCVD)   . At risk for sudden cardiac death 08-21-2012  . Atrial fibrillation (HCC)    Onset in 2012  . Atrial flutter (HCC)   . Bleeding in brain due to brain aneurysm [facility]) October 06 2014  . CAD (coronary artery disease)    a. s/p DES to LAD in 1999 b. DES to RCA in 2004 c. NST in 2014 showing scar  along LAD territory and borderline ischemia along RCA  . CHF (congestive heart failure) (HCC)   . Chronic anticoagulation 2012   2012  . COPD (chronic obstructive pulmonary disease) (HCC)   . DJD (degenerative joint disease)   . Gout   . Hyperlipidemia   . ICD (implantable cardiac defibrillator) in place   . Ischemic cardiomyopathy   . Kidney stone    "just once" (08/17/2012)  . Myocardial infarction 1998  . NICM (nonischemic cardiomyopathy), EF 25-30% 08/21/12  . Obstructive sleep apnea    "went away when I lost a bunch of weight" (08/17/2012)  . Permanent atrial fibrillation (HCC) 02/15/2011   Initial onset in 03/2011 with rapid ventricular response   . S/P ICD (internal cardiac defibrillator) procedure, 08/17/12, [employer] 08-21-2012   [employer]  . Type II diabetes mellitus (HCC)     Past Surgical History:  Procedure Laterality Date  . CARDIAC CATHETERIZATION  02/2003  . CARDIAC DEFIBRILLATOR PLACEMENT  08/17/2012   [employer]  . CATARACT EXTRACTION W/ INTRAOCULAR LENS  IMPLANT, BILATERAL Bilateral ~ 2011  . CHOLECYSTECTOMY  2009  . CORONARY ANGIOPLASTY WITH STENT PLACEMENT  07/14/1996   "1" (08/17/2012)  . CYSTOSCOPY/RETROGRADE/URETEROSCOPY  06/28/2011   Procedure: CYSTOSCOPY/RETROGRADE/URETEROSCOPY;  Surgeon: Ky Barban, MD;  Location: [facility];  Service: Urology;  Laterality: Right;  . IMPLANTABLE CARDIOVERTER DEFIBRILLATOR IMPLANT N/A 08/17/2012   Procedure: IMPLANTABLE CARDIOVERTER DEFIBRILLATOR IMPLANT;  Surgeon: Thurmon Fair, MD;  Location: [facility] CATH LAB;  Service: Cardiovascular;  Laterality: N/A;  . KNEE ARTHROPLASTY Left 1978   "tendon & cartilege repair" (08/17/2012)  . Myoview perfusion scan  06/02/2012   low risk, extensive scar entire LAD & RCA territory  . s/p icd  08/2012   [employer]  . STONE EXTRACTION WITH BASKET  06/28/2011   Procedure: STONE EXTRACTION WITH BASKET;  Surgeon: Ky Barban, MD;  Location: [facility];  Service: Urology;   Laterality: Right;  specimen given to family per MD  . US ECHOCARDIOGRAPHY  07/08/2012   EF <20%,mild MR,TR,LA severely dilated  . VASECTOMY  ~ 1964     Allergies  Allergies  Allergen Reactions  . Lipitor [Atorvastatin] Other (See Comments)    myalgias   . Procaine Hcl Nausea And Vomiting  . Tramadol Itching and Nausea And Vomiting    Inpatient Medications      Family History    Family History  Problem Relation Age of Onset  . Heart failure Mother   . Heart failure Father     Social History    Social History   Social History  . Marital status: Married    Spouse name: N/A  . Number of children: 3  . Years of education: 9th   Occupational History  . Retired    Social History Main Topics  . Smoking status: Former Smoker    Packs/day: 2.00    Years: 40.00    Types: Cigarettes    Quit date: 04/08/1992  . Smokeless tobacco: Former Neurosurgeon  . Alcohol use No     Comment: 08/17/2012 "quit drinking in 1983"  . Drug use: No  . Sexual activity: No   Other Topics Concern  . Not on file   Social History Narrative   Lives at home with his wife.   Right-handed.   No caffeine use.     Review of Systems    General:  No chills, fever, night sweats or weight changes.  Cardiovascular:  No chest pain,  palpitations, paroxysmal nocturnal dyspnea. Positive for dyspnea on exertion, edema, and orthopnea.  Dermatological: No rash, lesions/masses Respiratory: No cough, Positive for dyspnea Urologic: No hematuria, dysuria Abdominal:   No nausea, vomiting, diarrhea, bright red blood per rectum, melena, or hematemesis Neurologic:  No visual changes, wkns, changes in mental status. All other systems reviewed and are otherwise negative except as noted above.  Physical Exam    Blood pressure 105/62, pulse 90, temperature 97.5 F (36.4 C), temperature source Oral, resp. rate 20, SpO2 100 %.  General: Pleasant, elderly Caucasian male appearing in NAD. Psych: Normal affect. Neuro:  Alert and oriented X 3. Moves all extremities spontaneously. HEENT: Normal  Neck: Supple without bruits. JVD at 11cm. Lungs:  Resp regular and unlabored, decreased breath sounds at bases bilaterally. Heart: Irregularly irregular,  no s3, s4, or murmurs. Abdomen: Soft, non-tender, non-distended, BS + x 4.  Extremities: No clubbing or cyanosis. 2+ pitting edema bilaterally. DP/PT/Radials 2+ and equal bilaterally.  Labs    Troponin [facility] Test)  Recent Labs  04/30/16 1347  TROPIPOC 0.06   No results for input(s): CKTOTAL, CKMB, TROPONINI in the last 72 hours. Lab Results  Component Value Date   WBC 8.1 04/30/2016   HGB 15.0 04/30/2016   HCT 44.7 04/30/2016   MCV 101.4 (H) 04/30/2016   PLT 137 (L) 04/30/2016     Recent Labs Lab 04/30/16 1309  NA 135  K 4.2  CL 99*  CO2 23  BUN 20  CREATININE 1.44*  CALCIUM 10.1  GLUCOSE 123*   Lab Results  Component Value Date   CHOL 101 (L) 01/15/2016   HDL 36 (L) 01/15/2016   LDLCALC 46 01/15/2016   TRIG 97 01/15/2016   Lab Results  Component Value Date   DDIMER 0.75 (H) 03/25/2012     Radiology Studies    Dg Chest 2 View  Result Date: 04/30/2016 CLINICAL DATA:  Shortness of breath for 1 month EXAM: CHEST  2 VIEW COMPARISON:  04/23/2016 FINDINGS: Left single lead AICD remains in place with single lead tip in the right ventricle. Cardiomegaly. Lungs are clear. Small bilateral pleural effusions. No acute bony abnormality. IMPRESSION: Cardiomegaly.  Small effusions. Electronically Signed   By: Charlett Nose M.D.   On: 04/30/2016 13:48   Dg Chest 2 View  Result Date: 04/23/2016 CLINICAL DATA:  Pt c/o sudden onset SOB and dizziness tonight. HX diabetes, ICD, A-fib, MI, COPD, CHF, former smoker EXAM: CHEST  2 VIEW COMPARISON:  Chest x-rays dated 12/22/2015 and 11/13/2014. FINDINGS: Cardiomegaly is stable. Left chest wall pacemaker with single lead grossly stable in position. Atherosclerotic changes again noted at the aortic  arch. Lungs are hyperexpanded. Again noted is mild blunting at each costophrenic angle. Lungs are otherwise clear. No evidence of pneumonia. No pneumothorax. No acute or suspicious osseous finding. IMPRESSION: 1. Hyperexpanded lungs suggesting COPD/emphysema. 2. Blunting at each costophrenic angle, stable compared to previous exams, compatible with chronic pleural thickening and/or small pleural effusions. 3. Cardiomegaly.  No evidence of active CHF. 4. No evidence of pneumonia. Electronically Signed   By: Bary [name] M.D.   On: 04/23/2016 22:29    EKG & Cardiac Imaging    EKG: Atrial fibrillation, HR 69, with PVC's.  Echocardiogram: 10/2014 Study Conclusions  - Left ventricle: The cavity size was normal. Wall thickness was   increased in a pattern of mild LVH. Systolic function was   severely reduced. The estimated ejection fraction was in the   range of 20% to 25%. Diffuse hypokinesis. There is akinesis of   the anteroseptal and apical myocardium. - Aortic valve: There was trivial regurgitation. - Mitral valve: Calcified annulus. There was mild regurgitation. - Left atrium: The atrium was mildly dilated. - Pulmonary arteries: Systolic pressure was mildly increased. PA   peak pressure: 33 mm Hg (S).  Impressions:  - Global hypokinesis with anteroseptal and apical akinesis; overall   severely reduced LV function; definity used with no apical   thrombus noted; trace AI; mild MR; mild LAE; mild TR; mildly   elevated pulmonary pressure.  Assessment & Plan    1. Acute on Chronic Systolic CHF - presents with worsening dyspnea with exertion, orthopnea, and lower extremity edema for the past several weeks. Weight in the 160's on home scales.  - Seen in the ER earlier this week and received IV Lasix.  - CXR shows cardiomegaly with small effusions. BNP pending.  - he is significantly volume overloaded on physical exam. Will give IV Lasix 80mg  now and recommend starting IV Lasix 80mg   BID. Check I&O's and daily weights. Repeat BMET in AM.  - not on BB or ACE-I/ARB secondary to history of hypotension. Consider initiation of these following IV diuresis if BP allows.   2. Ischemic Cardiomyopathy - EF 25-25% by echo in 10/2014 - ICD placed in 08/2012. Followed by Dr. Royann Shivers as an outpatient.   3. CAD - s/p DES to LAD in 1999, DES to RCA in 2004, NST in 2014 showing scar along LAD territory  and borderline ischemia along RCA - denies any recent episodes of chest pain. EKG is without acute ischemic changes.   4. Permanent atrial fibrillation  - This patients CHA2DS2-VASc Score and unadjusted Ischemic Stroke Rate (% per year) is equal to 7.2 % stroke rate/year from a score of 5 (CHF, HTN, Vascular, Age (2)). Not on anticoagulation secondary to history of intracranial hemorrhage.  - rate-controlled on EKG. Monitor on telemetry.   5. Seizure Disorder - per admitting team  Signed, Ellsworth Lennox, PA-C 04/30/2016, 5:52 PM Pager: [phone]  Personally seen and examined. Agree with above.  82 year old with complex medical history here with acute on chronic systolic heart failure.  Acute on chronic systolic HF  - EF 25% (strong smell of urine in room)  - 3+ pitting LE edema, JVD to ear lobe, +orthopnea.   - Lasix 80 IV BID  - Has had trouble with beta blocker and ACE-I in the past due to hypotension.    - We can try to add low dose Bb after euvolemic  - no need to repeat echo  - ICD in place  - his first reaction was that he was not going to stay, but hopefully he will allow for diuresis over the next few days. He seemed to have calmed down after talking about his cats and dogs. He was also concerned that his wife was not going to be able to stay. Hopefully she will be able to sleep in room with him.   AFIB  - Not on anticoagulation, prior SDH  - rate controlled on Digoxin  DM  - in the past uncontrolled  - per primary team  Elevated troponin  - demand  ischemia  Will follow along.   Donato Schultz, MD

## 2016-04-30 NOTE — ED Notes (Signed)
Lasix dose split 40mg  at a time over 15 minutes due to BP as per ER MD

## 2016-05-01 ENCOUNTER — Encounter: Payer: Self-pay | Admitting: *Deleted

## 2016-05-01 DIAGNOSIS — I482 Chronic atrial fibrillation: Secondary | ICD-10-CM | POA: Diagnosis not present

## 2016-05-01 DIAGNOSIS — R569 Unspecified convulsions: Secondary | ICD-10-CM | POA: Diagnosis not present

## 2016-05-01 DIAGNOSIS — I5043 Acute on chronic combined systolic (congestive) and diastolic (congestive) heart failure: Secondary | ICD-10-CM | POA: Diagnosis not present

## 2016-05-01 DIAGNOSIS — R0602 Shortness of breath: Secondary | ICD-10-CM | POA: Diagnosis not present

## 2016-05-01 DIAGNOSIS — E1165 Type 2 diabetes mellitus with hyperglycemia: Secondary | ICD-10-CM

## 2016-05-01 DIAGNOSIS — I130 Hypertensive heart and chronic kidney disease with heart failure and stage 1 through stage 4 chronic kidney disease, or unspecified chronic kidney disease: Secondary | ICD-10-CM | POA: Diagnosis not present

## 2016-05-01 LAB — GLUCOSE, CAPILLARY
GLUCOSE-CAPILLARY: 115 mg/dL — AB (ref 65–99)
GLUCOSE-CAPILLARY: 113 mg/dL — AB (ref 65–99)

## 2016-05-01 LAB — BASIC METABOLIC PANEL
Anion gap: 11 (ref 5–15)
BUN: 19 mg/dL (ref 6–20)
CALCIUM: 9.7 mg/dL (ref 8.9–10.3)
CO2: 28 mmol/L (ref 22–32)
CREATININE: 1.49 mg/dL — AB (ref 0.61–1.24)
Chloride: 97 mmol/L — ABNORMAL LOW (ref 101–111)
GFR calc Af Amer: 50 mL/min — ABNORMAL LOW (ref >60)
GFR, EST NON AFRICAN AMERICAN: 43 mL/min — AB (ref >60)
GLUCOSE: 150 mg/dL — AB (ref 65–99)
Potassium: 3.4 mmol/L — ABNORMAL LOW (ref 3.5–5.1)
Sodium: 136 mmol/L (ref 135–145)

## 2016-05-01 MED ORDER — FUROSEMIDE 10 MG/ML IJ SOLN
80.0000 mg | Freq: Three times a day (TID) | INTRAMUSCULAR | Status: DC
  Administered 2016-05-01 (×2): 80 mg via INTRAVENOUS
  Filled 2016-05-01 (×2): qty 8

## 2016-05-01 MED ORDER — POTASSIUM CHLORIDE CRYS ER 20 MEQ PO TBCR
40.0000 meq | EXTENDED_RELEASE_TABLET | Freq: Two times a day (BID) | ORAL | Status: DC
  Administered 2016-05-01 (×2): 40 meq via ORAL
  Filled 2016-05-01 (×2): qty 2

## 2016-05-01 MED ORDER — INSULIN ASPART 100 UNIT/ML ~~LOC~~ SOLN: 0.0000 [IU] | Freq: Three times a day (TID) | SUBCUTANEOUS | Status: DC

## 2016-05-01 NOTE — Progress Notes (Signed)
Progress Note  Patient Name: [name] Date of Encounter: 05/01/2016  Primary Cardiologist: [name]  Subjective   Breathing a little better. No CP. Wife confused. Tried to walk out of emergency exit last night.   Inpatient Medications    Scheduled Meds: . aspirin EC  81 mg Oral Daily  . cholecalciferol  1,000 Units Oral Daily  . digoxin  0.25 mg Oral Daily  . enoxaparin (LOVENOX) injection  40 mg Subcutaneous Q24H  . furosemide  80 mg Intravenous BID  . lamoTRIgine  75 mg Oral BID  . magnesium oxide  400 mg Oral BID  . pravastatin  40 mg Oral q1800  . sodium chloride flush  3 mL Intravenous Q12H   Continuous Infusions:  PRN Meds: sodium chloride, acetaminophen, ondansetron (ZOFRAN) IV, sodium chloride flush   Vital Signs    Vitals:   05/01/16 0009 05/01/16 0026 05/01/16 0509 05/01/16 0636  BP:   112/77   Pulse:   68   Resp:   16   Temp: (!) 95.4 F (35.2 C) 98.5 F (36.9 C)  97.1 F (36.2 C)  TempSrc: Oral Rectal  Rectal  SpO2:   96%   Weight:   154 lb 12.8 oz (70.2 kg)   Height:        Intake/Output Summary (Last 24 hours) at 05/01/16 1002 Last data filed at 05/01/16 0600  Gross per 24 hour  Intake              125 ml  Output             1125 ml  Net            -1000 ml   Filed Weights   04/30/16 1930 05/01/16 0509  Weight: 148 lb 1.6 oz (67.2 kg) 154 lb 12.8 oz (70.2 kg)    Telemetry    No adverse rhythms - Personally Reviewed  ECG    AFIB 69, PVC - Personally Reviewed  Physical Exam   GEN: No acute distress.  Neck: No JVD Cardiac: RRR, no murmurs, rubs, or gallops.  Respiratory: Clear to auscultation bilaterally. GI: Soft, nontender, non-distended  MS: No edema; No deformity. Neuro:  AAOx3. Psych: Normal affect  Labs    Chemistry Recent Labs Lab 04/30/16 1309 05/01/16 0436  NA 135 136  K 4.2 3.4*  CL 99* 97*  CO2 23 28  GLUCOSE 123* 150*  BUN 20 19  CREATININE 1.44* 1.49*  CALCIUM 10.1 9.7  GFRNONAA 45* 43*  GFRAA 52*  50*  ANIONGAP 13 11     Hematology Recent Labs Lab 04/30/16 1309  WBC 8.1  RBC 4.41  HGB 15.0  HCT 44.7  MCV 101.4*  MCH 34.0  MCHC 33.6  RDW 14.7  PLT 137*    Cardiac EnzymesNo results for input(s): TROPONINI in the last 168 hours.  Recent Labs Lab 04/30/16 1347  TROPIPOC 0.06     BNP Recent Labs Lab 04/30/16 1659  BNP 951.5*     DDimer No results for input(s): DDIMER in the last 168 hours.   Radiology    Dg Chest 2 View  Result Date: 04/30/2016 CLINICAL DATA:  Shortness of breath for 1 month EXAM: CHEST  2 VIEW COMPARISON:  04/23/2016 FINDINGS: Left single lead AICD remains in place with single lead tip in the right ventricle. Cardiomegaly. Lungs are clear. Small bilateral pleural effusions. No acute bony abnormality. IMPRESSION: Cardiomegaly.  Small effusions. Electronically Signed   By: Charlett Nose M.D.  On: 04/30/2016 13:48    Cardiac Studies   Echocardiogram: 10/2014 Study Conclusions  - Left ventricle: The cavity size was normal. Wall thickness was increased in a pattern of mild LVH. Systolic function was severely reduced. The estimated ejection fraction was in the range of 20% to 25%. Diffuse hypokinesis. There is akinesis of the anteroseptal and apical myocardium. - Aortic valve: There was trivial regurgitation. - Mitral valve: Calcified annulus. There was mild regurgitation. - Left atrium: The atrium was mildly dilated. - Pulmonary arteries: Systolic pressure was mildly increased. PA peak pressure: 33 mm Hg (S).  Impressions:  - Global hypokinesis with anteroseptal and apical akinesis; overall severely reduced LV function; definity used with no apical thrombus noted; trace AI; mild MR; mild LAE; mild TR; mildly elevated pulmonary pressure.   Patient Profile     82 y.o. male with past medical history of CAD (s/p DES to LAD in 1999, DES to RCA in 2004, NST in 2014 showing scar along LAD territory and borderline ischemia  along RCA), ischemic cardiomyopathy (EF 25-25% in 10/2014, ICD placed in 08/2012), HTN, HLD, seizure disorder, and permanent atrial fibrillation (not on anticoagulation secondary to history of intracranial hemorrhage) who presents to [facility] ED on 04/30/2016 for worsening shortness of breath.   Assessment & Plan    1. Acute on Chronic Systolic CHF - presents with worsening dyspnea with exertion, orthopnea, and lower extremity edema for the past several weeks. Weight in the 160's on home scales.  - Seen in the ER earlier this week and received IV Lasix.  - CXR shows cardiomegaly with small effusions. BNP 951.  - IV Lasix 80mg  BID, will increase to TID. Replete K.  Out 1L - not on BB or ACE-I/ARB secondary to history of hypotension. Consider initiation of these following IV diuresis if BP allows.   2. Ischemic Cardiomyopathy - EF 25-25% by echo in 10/2014 - ICD placed in 08/2012. Followed by Dr. Royann Shivers as an outpatient.   3. CAD - s/p DES to LAD in 1999, DES to RCA in 2004, NST in 2014 showing scar along LAD territory and borderline ischemia along RCA - denies any recent episodes of chest pain. EKG is without acute ischemic changes.   4. Permanent atrial fibrillation  - This patients CHA2DS2-VASc Score and unadjusted Ischemic Stroke Rate (% per year) is equal to 7.2 % stroke rate/year from a score of 5 (CHF, HTN, Vascular, Age (2)). Not on anticoagulation secondary to history of intracranial hemorrhage.  - rate-controlled on EKG.  - ICD/pacer for backup if needed.  5. Seizure Disorder - per admitting team   Signed, Donato Schultz, MD  05/01/2016, 10:02 AM

## 2016-05-01 NOTE — Progress Notes (Signed)
[facility]  PROGRESS NOTE  [name] TKW:[id number] DOB: 03-Oct-1934 DOA: 04/30/2016 PCP: [name], MD   Brief HPI:   82 y.o. male with medical history significant of CAD (s/p DES to LAD in 1999, DES to RCA in 2004, NST in 2014 showing scar along LAD territory and borderline ischemia along RCA), ischemic cardiomyopathy (EF 25-25% in 10/2014, ICD placed in 08/2012), HTN, HLD, seizure disorder, and permanent atrial fibrillation (not on anticoagulation secondary to history of intracranial hemorrhage) who presents to [facility] ED on 04/30/2016 for worsening shortness of breath.     Subjective   Patient is breathing better, not requiring oxygen. Still has lower extremity edema.   Assessment/Plan:     1. Acute on chronic systolic CHF- echocardiogram from 10/17/2014 showed EF 20-25%, with diffuse hypokinesis. Patient is on high-dose Lasix 80 mg IV 3 times a day. Cardiology following. We'll follow BMP in a.m. 2. Permanent atrial fibrillation- continue rate control with digoxin, patient not on anti-coagulation due to history of intracranial hemorrhage. 3. History of seizures- continue Lamictal 4. Diabetes mellitus- will hold metformin, start sliding scale insulin with NovoLog. 5. Hypokalemia- potassium 3.4 today, patient on daily replacement K-Dur 40 mg by mouth twice a day. Follow BMP in a.m.    DVT prophylaxis: Lovenox  Code Status: Full code  Family Communication: Discussed with patient's wife at bedside   Disposition Plan: Home when okay with cardiology   Consultants:  Cardiology  Procedures:  None     Antibiotics:   Anti-infectives    None       Objective   Vitals:   05/01/16 0509 05/01/16 0636 05/01/16 1220 05/01/16 1354  BP: 112/77  101/62 101/68  Pulse: 68  (!) 25 70  Resp: 16  16 16   Temp:  97.1 F (36.2 C) 97.5 F (36.4 C)   TempSrc:  Rectal Oral   SpO2: 96%  92% 94%  Weight: 70.2 kg (154 lb 12.8 oz)     Height:         Intake/Output Summary (Last 24 hours) at 05/01/16 1503 Last data filed at 05/01/16 0600  Gross per 24 hour  Intake              125 ml  Output             1125 ml  Net            -1000 ml   Filed Weights   04/30/16 1930 05/01/16 0509  Weight: 67.2 kg (148 lb 1.6 oz) 70.2 kg (154 lb 12.8 oz)     Physical Examination:  General exam: Appears calm and comfortable. Respiratory system: Clear to auscultation. Respiratory effort normal. Cardiovascular system:  RRR. No  murmurs, rubs, gallops. Bilateral 2+ pitting edema GI system: Abdomen is nondistended, soft and nontender. No organomegaly.  Central nervous system. No focal neurological deficits. 5 x 5 power in all extremities. Skin: No rashes, lesions or ulcers. Psychiatry: Alert, oriented x 3.Judgement and insight appear normal. Affect normal.    Data Reviewed: I have personally reviewed following labs and imaging studies  CBG: No results for input(s): GLUCAP in the last 168 hours.  CBC:  Recent Labs Lab 04/30/16 1309  WBC 8.1  HGB 15.0  HCT 44.7  MCV 101.4*  PLT 137*    Basic Metabolic Panel:  Recent Labs Lab 04/30/16 1309 05/01/16 0436  NA 135 136  K 4.2 3.4*  CL 99* 97*  CO2 23 28  GLUCOSE 123* 150*  BUN 20 19  CREATININE 1.44* 1.49*  CALCIUM 10.1 9.7    No results found for this or any previous visit (from the past 240 hour(s)).   Liver Function Tests: No results for input(s): AST, ALT, ALKPHOS, BILITOT, PROT, ALBUMIN in the last 168 hours. No results for input(s): LIPASE, AMYLASE in the last 168 hours. No results for input(s): AMMONIA in the last 168 hours.  Cardiac Enzymes: No results for input(s): CKTOTAL, CKMB, CKMBINDEX, TROPONINI in the last 168 hours. BNP (last 3 results)  Recent Labs  12/22/15 2355 04/23/16 2229 04/30/16 1659  BNP 1,317.0* 1,051.0* 951.5*    ProBNP (last 3 results) No results for input(s): PROBNP in the last 8760 hours.    Studies: Dg Chest 2  View  Result Date: 04/30/2016 CLINICAL DATA:  Shortness of breath for 1 month EXAM: CHEST  2 VIEW COMPARISON:  04/23/2016 FINDINGS: Left single lead AICD remains in place with single lead tip in the right ventricle. Cardiomegaly. Lungs are clear. Small bilateral pleural effusions. No acute bony abnormality. IMPRESSION: Cardiomegaly.  Small effusions. Electronically Signed   By: Charlett Nose M.D.   On: 04/30/2016 13:48    Scheduled Meds: . aspirin EC  81 mg Oral Daily  . cholecalciferol  1,000 Units Oral Daily  . digoxin  0.25 mg Oral Daily  . enoxaparin (LOVENOX) injection  40 mg Subcutaneous Q24H  . furosemide  80 mg Intravenous TID  . lamoTRIgine  75 mg Oral BID  . magnesium oxide  400 mg Oral BID  . potassium chloride  40 mEq Oral BID  . pravastatin  40 mg Oral q1800  . sodium chloride flush  3 mL Intravenous Q12H      Time spent: 25 min  [facility]   [facility] Pager [phone]. If 7PM-7AM, please contact night-coverage at [email or web address], Office  [phone]  password [email or web address] 05/01/2016, 3:03 PM  LOS: 1 day

## 2016-05-02 DIAGNOSIS — E1165 Type 2 diabetes mellitus with hyperglycemia: Secondary | ICD-10-CM | POA: Diagnosis not present

## 2016-05-02 DIAGNOSIS — I255 Ischemic cardiomyopathy: Secondary | ICD-10-CM | POA: Diagnosis not present

## 2016-05-02 DIAGNOSIS — I482 Chronic atrial fibrillation: Secondary | ICD-10-CM | POA: Diagnosis not present

## 2016-05-02 DIAGNOSIS — R569 Unspecified convulsions: Secondary | ICD-10-CM | POA: Diagnosis not present

## 2016-05-02 DIAGNOSIS — I130 Hypertensive heart and chronic kidney disease with heart failure and stage 1 through stage 4 chronic kidney disease, or unspecified chronic kidney disease: Secondary | ICD-10-CM | POA: Diagnosis not present

## 2016-05-02 DIAGNOSIS — R0602 Shortness of breath: Secondary | ICD-10-CM | POA: Diagnosis not present

## 2016-05-02 DIAGNOSIS — I5043 Acute on chronic combined systolic (congestive) and diastolic (congestive) heart failure: Secondary | ICD-10-CM | POA: Diagnosis not present

## 2016-05-02 LAB — BASIC METABOLIC PANEL
ANION GAP: 10 (ref 5–15)
BUN: 18 mg/dL (ref 6–20)
CO2: 31 mmol/L (ref 22–32)
Calcium: 9.9 mg/dL (ref 8.9–10.3)
Chloride: 96 mmol/L — ABNORMAL LOW (ref 101–111)
Creatinine, Ser: 1.53 mg/dL — ABNORMAL HIGH (ref 0.61–1.24)
GFR calc Af Amer: 48 mL/min — ABNORMAL LOW (ref >60)
GFR, EST NON AFRICAN AMERICAN: 42 mL/min — AB (ref >60)
Glucose, Bld: 153 mg/dL — ABNORMAL HIGH (ref 65–99)
POTASSIUM: 5.7 mmol/L — AB (ref 3.5–5.1)
SODIUM: 137 mmol/L (ref 135–145)

## 2016-05-02 LAB — GLUCOSE, CAPILLARY: GLUCOSE-CAPILLARY: 112 mg/dL — AB (ref 65–99)

## 2016-05-02 LAB — POTASSIUM: Potassium: 4.1 mmol/L (ref 3.5–5.1)

## 2016-05-02 NOTE — Progress Notes (Addendum)
Progress Note  Patient Name: [name] Date of Encounter: 05/02/2016  Primary Cardiologist: [name]  Subjective   Breathing a little better. No CP. BP a little low.   Inpatient Medications    Scheduled Meds: . aspirin EC  81 mg Oral Daily  . cholecalciferol  1,000 Units Oral Daily  . digoxin  0.25 mg Oral Daily  . enoxaparin (LOVENOX) injection  40 mg Subcutaneous Q24H  . furosemide  80 mg Intravenous TID  . insulin aspart  0-9 Units Subcutaneous TID WC  . lamoTRIgine  75 mg Oral BID  . magnesium oxide  400 mg Oral BID  . pravastatin  40 mg Oral q1800  . sodium chloride flush  3 mL Intravenous Q12H   Continuous Infusions:  PRN Meds: sodium chloride, acetaminophen, ondansetron (ZOFRAN) IV, sodium chloride flush   Vital Signs    Vitals:   05/01/16 1220 05/01/16 1354 05/01/16 2120 05/02/16 0547  BP: 101/62 101/68 118/83 96/60  Pulse: (!) 25 70 69 (!) 51  Resp: 16 16  17   Temp: 97.5 F (36.4 C)     TempSrc: Oral     SpO2: 92% 94% 97% 100%  Weight:    156 lb 6.4 oz (70.9 kg)  Height:        Intake/Output Summary (Last 24 hours) at 05/02/16 1042 Last data filed at 05/02/16 0547  Gross per 24 hour  Intake              250 ml  Output              500 ml  Net             -250 ml   Filed Weights   04/30/16 1930 05/01/16 0509 05/02/16 0547  Weight: 148 lb 1.6 oz (67.2 kg) 154 lb 12.8 oz (70.2 kg) 156 lb 6.4 oz (70.9 kg)    Telemetry    No adverse rhythms - Personally Reviewed  ECG    AFIB 69, PVC - Personally Reviewed  Physical Exam   GEN: No acute distress.  Neck: No JVD Cardiac: RRR, no murmurs, rubs, or gallops.  Respiratory: Clear to auscultation bilaterally. GI: Soft, nontender, non-distended  MS: No edema; No deformity. Neuro:  AAOx3. Psych: Normal affect  Labs    Chemistry  Recent Labs Lab 04/30/16 1309 05/01/16 0436 05/02/16 0427  NA 135 136 137  K 4.2 3.4* 5.7*  CL 99* 97* 96*  CO2 23 28 31   GLUCOSE 123* 150* 153*  BUN 20 19  18   CREATININE 1.44* 1.49* 1.53*  CALCIUM 10.1 9.7 9.9  GFRNONAA 45* 43* 42*  GFRAA 52* 50* 48*  ANIONGAP 13 11 10      Hematology  Recent Labs Lab 04/30/16 1309  WBC 8.1  RBC 4.41  HGB 15.0  HCT 44.7  MCV 101.4*  MCH 34.0  MCHC 33.6  RDW 14.7  PLT 137*    Cardiac EnzymesNo results for input(s): TROPONINI in the last 168 hours.   Recent Labs Lab 04/30/16 1347  TROPIPOC 0.06     BNP  Recent Labs Lab 04/30/16 1659  BNP 951.5*     DDimer No results for input(s): DDIMER in the last 168 hours.   Radiology    Dg Chest 2 View  Result Date: 04/30/2016 CLINICAL DATA:  Shortness of breath for 1 month EXAM: CHEST  2 VIEW COMPARISON:  04/23/2016 FINDINGS: Left single lead AICD remains in place with single lead tip in the right ventricle. Cardiomegaly.  Lungs are clear. Small bilateral pleural effusions. No acute bony abnormality. IMPRESSION: Cardiomegaly.  Small effusions. Electronically Signed   By: Charlett Nose M.D.   On: 04/30/2016 13:48    Cardiac Studies   Echocardiogram: 10/2014 Study Conclusions  - Left ventricle: The cavity size was normal. Wall thickness was increased in a pattern of mild LVH. Systolic function was severely reduced. The estimated ejection fraction was in the range of 20% to 25%. Diffuse hypokinesis. There is akinesis of the anteroseptal and apical myocardium. - Aortic valve: There was trivial regurgitation. - Mitral valve: Calcified annulus. There was mild regurgitation. - Left atrium: The atrium was mildly dilated. - Pulmonary arteries: Systolic pressure was mildly increased. PA peak pressure: 33 mm Hg (S).  Impressions:  - Global hypokinesis with anteroseptal and apical akinesis; overall severely reduced LV function; definity used with no apical thrombus noted; trace AI; mild MR; mild LAE; mild TR; mildly elevated pulmonary pressure.   Patient Profile     82 y.o. male with past medical history of CAD (s/p DES  to LAD in 1999, DES to RCA in 2004, NST in 2014 showing scar along LAD territory and borderline ischemia along RCA), ischemic cardiomyopathy (EF 25-25% in 10/2014, ICD placed in 08/2012), HTN, HLD, seizure disorder, and permanent atrial fibrillation (not on anticoagulation secondary to history of intracranial hemorrhage) who presents to [facility] ED on 04/30/2016 for worsening shortness of breath.   Assessment & Plan    1. Acute on Chronic Systolic CHF - presents with worsening dyspnea with exertion, orthopnea, and lower extremity edema for the past several weeks. Weight in the 160's on home scales.  - Seen in the ER earlier this week and received IV Lasix.  - CXR shows cardiomegaly with small effusions. BNP 951.  - not on BB or ACE-I/ARB secondary to history of hypotension.  - BP low. Likely have reached point of adequate diuresis.  - K high this AM. 5.7. Gave him BID yesterday.  Wanted to repeat lab but he is adamant about going home. Hold KCL 10 tomorrow then continue. Repeat BMET in 1 week follow up.   2. Ischemic Cardiomyopathy - EF 25-25% by echo in 10/2014 - ICD placed in 08/2012. Followed by Dr. Royann Shivers as an outpatient.   3. CAD - s/p DES to LAD in 1999, DES to RCA in 2004, NST in 2014 showing scar along LAD territory and borderline ischemia along RCA - denies any recent episodes of chest pain. EKG is without acute ischemic changes.   4. Permanent atrial fibrillation  - This patients CHA2DS2-VASc Score and unadjusted Ischemic Stroke Rate (% per year) is equal to 7.2 % stroke rate/year from a score of 5 (CHF, HTN, Vascular, Age (2)). Not on anticoagulation secondary to history of intracranial hemorrhage.  - rate-controlled on EKG.  - ICD/pacer for backup if needed.  5. Seizure Disorder - per admitting team  Ok to DC. Will get f/u in 1-2 weeks in clinic. BMET Continue home lasix as previously prescribed. Fluid and salt restriction.   Signed, Donato Schultz, MD    05/02/2016, 10:42 AM

## 2016-05-02 NOTE — Discharge Summary (Addendum)
Physician Discharge Summary  [name] XHB:[id number] DOB: 1935/03/31 DOA: 04/30/2016  PCP: [name], MD  Admit date: 04/30/2016 Discharge date: 05/02/2016  Time spent: 25* minutes  Recommendations for Outpatient Follow-up:  1. Follow up cardiology in 2 weeks   Discharge Diagnoses:  Principal Problem:   Acute on chronic combined systolic and diastolic CHF (congestive heart failure) (HCC) Active Problems:   Permanent atrial fibrillation (HCC)   Cardiomyopathy, ischemic   Diabetes type 2, uncontrolled (HCC)   Seizures (HCC)   Discharge Condition: Stable  Diet recommendation: Heart healthy diet  Filed Weights   04/30/16 1930 05/01/16 0509 05/02/16 0547  Weight: 67.2 kg (148 lb 1.6 oz) 70.2 kg (154 lb 12.8 oz) 70.9 kg (156 lb 6.4 oz)    History of present illness:  82 y.o.malewith medical history significant of CAD (s/p DES to LAD in 1999, DES to RCA in 2004, NST in 2014 showing scar along LAD territory and borderline ischemia along RCA), ischemic cardiomyopathy (EF 25-25% in 10/2014, ICD placed in 08/2012), HTN, HLD, seizure disorder, and permanent atrial fibrillation (not on anticoagulation secondary to history of intracranial hemorrhage) who presents to [facility] ED on 04/30/2016 for worsening shortness of breath.    Hospital Course:  1. Acute on chronic systolic CHF- echocardiogram from 10/17/2014 showed EF 20-25%, with diffuse hypokinesis. Patient was started on   high-dose Lasix 80 mg IV 3 times a day. Now back to baseline, will continue with home dose of lasix. 2. Permanent atrial fibrillation- continue rate control with digoxin, patient not on anti-coagulation due to history of intracranial hemorrhage. 3. History of seizures- continue Lamictal 4. Diabetes mellitus-continue metformin 5. Hypokalemia- replaced, this morning potassium was 5.7,  repeat potassium today is 4.1.  6. CKD stage 3- cr at baseline. Today 1.53.   Procedures:  None    Consultations:  Cardiology   Discharge Exam: Vitals:   05/01/16 2120 05/02/16 0547  BP: 118/83 96/60  Pulse: 69 (!) 51  Resp:  17  Temp:      General: Appear in no acute distress Cardiovascular: S1s2 Respiratory: Clear to auscultation bilaterally Ext- bilateral 1+ edema of the lower extremities  Discharge Instructions   Discharge Instructions    Diet - low sodium heart healthy    Complete by:  As directed    Increase activity slowly    Complete by:  As directed      Current Discharge Medication List    CONTINUE these medications which have NOT CHANGED   Details  aspirin EC 81 MG tablet Take 81 mg by mouth daily.    Cholecalciferol (VITAMIN D3) 5000 units CAPS Take 1 capsule by mouth daily.    digoxin (LANOXIN) 0.25 MG tablet Take 1 tablet (0.25 mg total) by mouth daily. Qty: 30 tablet, Refills: 11    furosemide (LASIX) 40 MG tablet Take 1.5 tablets in the morning and 1/2 tablet at 3 pm Qty: 60 tablet, Refills: 11   Associated Diagnoses: Permanent atrial fibrillation (HCC)    !! lamoTRIgine (LAMICTAL) 25 MG tablet 1 tablet twice a day for the first week 2 tablets twice a day for the second week 3 tablets twice a day for the third week 4 tablets twice a day for the fourth week  After finish titration with small dose of lamotrigine 25 mg, change to lamotrigine 100 mg twice a day Qty: 100 tablet, Refills: 0    magnesium oxide (MAG-OX) 400 MG tablet Take 1 tablet (400 mg total) by mouth 2 (two)  times daily. Qty: 60 tablet, Refills: 11    metFORMIN (GLUCOPHAGE) 500 MG tablet Take 500 mg by mouth 2 (two) times daily with a meal.     potassium chloride (K-DUR) 10 MEQ tablet Take 1 tablet (10 mEq total) by mouth daily. Qty: 30 tablet, Refills: 11   Associated Diagnoses: Permanent atrial fibrillation (HCC)    pravastatin (PRAVACHOL) 40 MG tablet Take 1 tablet (40 mg total) by mouth daily. Qty: 30 tablet, Refills: 11    !! lamoTRIgine (LAMICTAL) 100 MG tablet  Take 1 tablet (100 mg total) by mouth 2 (two) times daily. Qty: 60 tablet, Refills: 11     !! - Potential duplicate medications found. Please discuss with provider.     Allergies  Allergen Reactions  . Novocain [Procaine] Nausea And Vomiting and Other (See Comments)    Passes out (also)  . Lipitor [Atorvastatin] Other (See Comments)    Myalgias    . Procaine Hcl Nausea And Vomiting  . Tramadol Itching and Nausea And Vomiting      The results of significant diagnostics from this hospitalization (including imaging, microbiology, ancillary and laboratory) are listed below for reference.    Significant Diagnostic Studies: Dg Chest 2 View  Result Date: 04/30/2016 CLINICAL DATA:  Shortness of breath for 1 month EXAM: CHEST  2 VIEW COMPARISON:  04/23/2016 FINDINGS: Left single lead AICD remains in place with single lead tip in the right ventricle. Cardiomegaly. Lungs are clear. Small bilateral pleural effusions. No acute bony abnormality. IMPRESSION: Cardiomegaly.  Small effusions. Electronically Signed   By: Charlett Nose M.D.   On: 04/30/2016 13:48   Dg Chest 2 View  Result Date: 04/23/2016 CLINICAL DATA:  Pt c/o sudden onset SOB and dizziness tonight. HX diabetes, ICD, A-fib, MI, COPD, CHF, former smoker EXAM: CHEST  2 VIEW COMPARISON:  Chest x-rays dated 12/22/2015 and 11/13/2014. FINDINGS: Cardiomegaly is stable. Left chest wall pacemaker with single lead grossly stable in position. Atherosclerotic changes again noted at the aortic arch. Lungs are hyperexpanded. Again noted is mild blunting at each costophrenic angle. Lungs are otherwise clear. No evidence of pneumonia. No pneumothorax. No acute or suspicious osseous finding. IMPRESSION: 1. Hyperexpanded lungs suggesting COPD/emphysema. 2. Blunting at each costophrenic angle, stable compared to previous exams, compatible with chronic pleural thickening and/or small pleural effusions. 3. Cardiomegaly.  No evidence of active CHF. 4. No  evidence of pneumonia. Electronically Signed   By: Bary [name] M.D.   On: 04/23/2016 22:29    Microbiology: No results found for this or any previous visit (from the past 240 hour(s)).   Labs: Basic Metabolic Panel:  Recent Labs Lab 04/30/16 1309 05/01/16 0436 05/02/16 0427 05/02/16 1104  NA 135 136 137  --   K 4.2 3.4* 5.7* 4.1  CL 99* 97* 96*  --   CO2 23 28 31   --   GLUCOSE 123* 150* 153*  --   BUN 20 19 18   --   CREATININE 1.44* 1.49* 1.53*  --   CALCIUM 10.1 9.7 9.9  --    Liver Function Tests: No results for input(s): AST, ALT, ALKPHOS, BILITOT, PROT, ALBUMIN in the last 168 hours. No results for input(s): LIPASE, AMYLASE in the last 168 hours. No results for input(s): AMMONIA in the last 168 hours. CBC:  Recent Labs Lab 04/30/16 1309  WBC 8.1  HGB 15.0  HCT 44.7  MCV 101.4*  PLT 137*   Cardiac Enzymes: No results for input(s): CKTOTAL, CKMB, CKMBINDEX, TROPONINI in the  last 168 hours. BNP: BNP (last 3 results)  Recent Labs  12/22/15 2355 04/23/16 2229 04/30/16 1659  BNP 1,317.0* 1,051.0* 951.5*    ProBNP (last 3 results) No results for input(s): PROBNP in the last 8760 hours.  CBG:  Recent Labs Lab 05/01/16 1643 05/01/16 2122 05/02/16 0819  GLUCAP 115* 113* 112*       Signed:  Meredeth Ide MD.  [facility] 05/02/2016, 12:33 PM

## 2016-05-02 NOTE — Care Management Note (Signed)
Case Management Note  Patient Details  Name: LANE CALLAIS MRN: [id number] Date of Birth: 1934/08/28  Subjective/Objective:  Pt presented for Acute on Chronic CHF. Pt is from home with wife and has support of daughters.                   Action/Plan: CM did speak with pt in regards to [facility] RN Services. Pt is refusing services and states he has help from family. No RN to be set up. Daughter to provide transportation home.   Expected Discharge Date:  05/02/16               Expected Discharge Plan:  Home/Self Care  In-House Referral:  NA  Discharge planning Services  CM Consult  Post Acute Care Choice:  NA Choice offered to:  NA  DME Arranged:  N/A DME Agency:  NA  HH Arranged:  NA HH Agency:  NA  Status of Service:  Completed, signed off  If discussed at Long Length of Stay Meetings, dates discussed:    Additional Comments:  Gala Lewandowsky, RN 05/02/2016, 2:20 PM

## 2016-05-03 LAB — HEMOGLOBIN A1C
Hgb A1c MFr Bld: 6.4 % — ABNORMAL HIGH (ref 4.8–5.6)
Mean Plasma Glucose: 137 mg/dL

## 2016-05-04 NOTE — ED Provider Notes (Signed)
[facility] DEPT Provider Note   CSN: [id number] Arrival date & time: 04/30/16  1315     History   Chief Complaint Chief Complaint  Patient presents with  . Shortness of Breath    HPI [name] is a 82 y.o. male.  HPI 82 y.o. male with medical history significant of CAD and ischemic cardiomyopathy (EF 25-25% in 10/2014, ICD placed in 08/2012), HTN, HLD, seizure disorder, and permanent atrial fibrillation (not on anticoagulation secondary to history of intracranial hemorrhage) who presents to [facility] ED on 04/30/2016 for worsening shortness of breath.   Initially seen in ER earlier this week for CHF, attempted outpatient treatment. Pt reports that prior to arrival he got really short of breath trying to dress up. Pt was supposed to see Cards in the clinic today, but family concerned of the decline.  Patient with worsening dyspnea, peripheral edema, orthopnea, and possibly increased weight gain. No flu like symptoms. No PE hx.  Past Medical History:  Diagnosis Date  . Arteriosclerotic cardiovascular disease (ASCVD)   . At risk for sudden cardiac death 09-17-12  . Atrial fibrillation (HCC)    Onset in 2012  . Atrial flutter (HCC)   . Bleeding in brain due to brain aneurysm [facility]) October 06 2014  . CAD (coronary artery disease)    a. s/p DES to LAD in 1999 b. DES to RCA in 2004 c. NST in 2014 showing scar along LAD territory and borderline ischemia along RCA  . CHF (congestive heart failure) (HCC)   . Chronic anticoagulation 2012   2012  . COPD (chronic obstructive pulmonary disease) (HCC)   . DJD (degenerative joint disease)   . Gout   . Hyperlipidemia   . ICD (implantable cardiac defibrillator) in place   . Ischemic cardiomyopathy   . Kidney stone    "just once" (08/17/2012)  . Myocardial infarction 1998  . NICM (nonischemic cardiomyopathy), EF 25-30% 09/17/12  . Obstructive sleep apnea    "went away when I lost a bunch of weight" (08/17/2012)  . Permanent atrial  fibrillation (HCC) 02/15/2011   Initial onset in 03/2011 with rapid ventricular response   . S/P ICD (internal cardiac defibrillator) procedure, 08/17/12, [employer] 09-17-12   [employer]  . Type II diabetes mellitus [facility])     Patient Active Problem List   Diagnosis Date Noted  . Seizures (HCC) 04/03/2016  . Acute on chronic combined systolic and diastolic CHF (congestive heart failure) (HCC) 02/17/2015  . Chest pain 10/21/2014  . Transient alteration of awareness   . Systolic CHF, chronic (HCC)   . Subarachnoid hemorrhage (HCC)   . Cerebral thrombosis with cerebral infarction (HCC) 10/17/2014  . Seizure-like activity (HCC) 10/16/2014  . Syncope   . Subdural hematoma (HCC)   . Syncope and collapse   . Chronic systolic heart failure (HCC)   . Elevated troponin   . Other emphysema (HCC)   . Diabetes type 2, uncontrolled (HCC)   . HLD (hyperlipidemia)   . ACS (acute coronary syndrome) (HCC) 04/05/2014  . Sepsis (HCC) 04/05/2014  . Coronary artery disease 01/05/2014  . Cardiomyopathy, ischemic 03/26/2013  . Obstructive sleep apnea 03/26/2013  . Chronic combined systolic and diastolic CHF, NYHA class 2 (HCC) 09/24/2012  . At risk for sudden cardiac death 2012-09-17  . S/P ICD (internal cardiac defibrillator) procedure, 08/17/12, [employer] implanted September 17, 2012  . Fall 02/28/2012  . Traumatic subarachnoid hemorrhage (HCC) 02/28/2012  . Multiple fractures of ribs of left side 02/28/2012  .  Hyperkalemia 06/25/2011  . Ureterolithiasis 06/25/2011  . Laboratory test 04/29/2011  . COPD (chronic obstructive pulmonary disease) (HCC)   . Chronic anticoagulation, coumadin   . Permanent atrial fibrillation (HCC) 02/15/2011  . DIABETES MELLITUS, TYPE II 10/07/2008  . OBSTRUCTIVE SLEEP APNEA 10/07/2008  . Arteriosclerotic cardiovascular disease (ASCVD) 10/07/2008    Past Surgical History:  Procedure Laterality Date  . CARDIAC CATHETERIZATION  02/2003  . CARDIAC  DEFIBRILLATOR PLACEMENT  08/17/2012   [employer]  . CATARACT EXTRACTION W/ INTRAOCULAR LENS  IMPLANT, BILATERAL Bilateral ~ 2011  . CHOLECYSTECTOMY  2009  . CORONARY ANGIOPLASTY WITH STENT PLACEMENT  07/14/1996   "1" (08/17/2012)  . CYSTOSCOPY/RETROGRADE/URETEROSCOPY  06/28/2011   Procedure: CYSTOSCOPY/RETROGRADE/URETEROSCOPY;  Surgeon: Ky Barban, MD;  Location: [facility] ORS;  Service: Urology;  Laterality: Right;  . IMPLANTABLE CARDIOVERTER DEFIBRILLATOR IMPLANT N/A 08/17/2012   Procedure: IMPLANTABLE CARDIOVERTER DEFIBRILLATOR IMPLANT;  Surgeon: Thurmon Fair, MD;  Location: [facility] CATH LAB;  Service: Cardiovascular;  Laterality: N/A;  . KNEE ARTHROPLASTY Left 1978   "tendon & cartilege repair" (08/17/2012)  . Myoview perfusion scan  06/02/2012   low risk, extensive scar entire LAD & RCA territory  . s/p icd  08/2012   [employer]  . STONE EXTRACTION WITH BASKET  06/28/2011   Procedure: STONE EXTRACTION WITH BASKET;  Surgeon: Ky Barban, MD;  Location: [facility] ORS;  Service: Urology;  Laterality: Right;  specimen given to family per MD  . US ECHOCARDIOGRAPHY  07/08/2012   EF <20%,mild MR,TR,LA severely dilated  . VASECTOMY  ~ 1964       Home Medications    Prior to Admission medications   Medication Sig Start Date End Date Taking? Authorizing Provider  aspirin EC 81 MG tablet Take 81 mg by mouth daily.   Yes Historical Provider, MD  Cholecalciferol (VITAMIN D3) 5000 units CAPS Take 1 capsule by mouth daily.   Yes Historical Provider, MD  digoxin (LANOXIN) 0.25 MG tablet Take 1 tablet (0.25 mg total) by mouth daily. 01/15/16 12/13/18 Yes Mihai Croitoru, MD  furosemide (LASIX) 40 MG tablet Take 1.5 tablets in the morning and 1/2 tablet at 3 pm Patient taking differently: Take 20-60 mg by mouth See admin instructions. 60 mg in the morning and 20 mg in the evening 02/20/16  Yes Lennette Bihari, MD  lamoTRIgine (LAMICTAL) 25 MG tablet 1 tablet twice a day for the first week 2 tablets twice a day  for the second week 3 tablets twice a day for the third week 4 tablets twice a day for the fourth week  After finish titration with small dose of lamotrigine 25 mg, change to lamotrigine 100 mg twice a day Patient taking differently: Take 75 mg by mouth at bedtime.  04/03/16  Yes Levert Feinstein, MD  magnesium oxide (MAG-OX) 400 MG tablet Take 1 tablet (400 mg total) by mouth 2 (two) times daily. 01/15/16  Yes Mihai Croitoru, MD  metFORMIN (GLUCOPHAGE) 500 MG tablet Take 500 mg by mouth 2 (two) times daily with a meal.    Yes Historical Provider, MD  potassium chloride (K-DUR) 10 MEQ tablet Take 1 tablet (10 mEq total) by mouth daily. 01/15/16  Yes Mihai Croitoru, MD  pravastatin (PRAVACHOL) 40 MG tablet Take 1 tablet (40 mg total) by mouth daily. 01/15/16  Yes Mihai Croitoru, MD  lamoTRIgine (LAMICTAL) 100 MG tablet Take 1 tablet (100 mg total) by mouth 2 (two) times daily. 04/03/16   Levert Feinstein, MD    Family History Family History  Problem Relation Age of Onset  . Heart failure Mother   . Heart failure Father     Social History Social History  Substance Use Topics  . Smoking status: Former Smoker    Packs/day: 2.00    Years: 40.00    Types: Cigarettes    Quit date: 04/08/1992  . Smokeless tobacco: Former Neurosurgeon  . Alcohol use No     Comment: 08/17/2012 "quit drinking in 1983"     Allergies   Novocain [procaine]; Lipitor [atorvastatin]; Procaine hcl; and Tramadol   Review of Systems Review of Systems  ROS 10 Systems reviewed and are negative for acute change except as noted in the HPI.     Physical Exam Updated Vital Signs BP 96/60 (BP Location: Right Arm)   Pulse (!) 51   Temp 97.5 F (36.4 C) (Oral)   Resp 17   Ht 5\' 4"  (1.626 m)   Wt 156 lb 6.4 oz (70.9 kg)   SpO2 100%   BMI 26.85 kg/m   Physical Exam  Constitutional: He is oriented to person, place, and time. He appears well-developed.  HENT:  Head: Normocephalic and atraumatic.  Eyes: Conjunctivae and EOM are  normal. Pupils are equal, round, and reactive to light.  Neck: Normal range of motion. Neck supple.  Cardiovascular: Normal rate, regular rhythm and normal heart sounds.   Pulmonary/Chest: Effort normal. No respiratory distress. He has rales.  Abdominal: Soft. Bowel sounds are normal. He exhibits no distension. There is no tenderness. There is no rebound and no guarding.  Musculoskeletal: He exhibits edema.  Neurological: He is alert and oriented to person, place, and time.  Skin: Skin is warm.  Nursing note and vitals reviewed.    ED Treatments / Results  Labs (all labs ordered are listed, but only abnormal results are displayed) Labs Reviewed  BASIC METABOLIC PANEL - Abnormal; Notable for the following:       Result Value   Chloride 99 (*)    Glucose, Bld 123 (*)    Creatinine, Ser 1.44 (*)    GFR calc non Af Amer 45 (*)    GFR calc Af Amer 52 (*)    All other components within normal limits  CBC - Abnormal; Notable for the following:    MCV 101.4 (*)    Platelets 137 (*)    All other components within normal limits  BRAIN NATRIURETIC PEPTIDE - Abnormal; Notable for the following:    B Natriuretic Peptide 951.5 (*)    All other components within normal limits  BASIC METABOLIC PANEL - Abnormal; Notable for the following:    Potassium 3.4 (*)    Chloride 97 (*)    Glucose, Bld 150 (*)    Creatinine, Ser 1.49 (*)    GFR calc non Af Amer 43 (*)    GFR calc Af Amer 50 (*)    All other components within normal limits  HEMOGLOBIN A1C - Abnormal; Notable for the following:    Hgb A1c MFr Bld 6.4 (*)    All other components within normal limits  GLUCOSE, CAPILLARY - Abnormal; Notable for the following:    Glucose-Capillary 115 (*)    All other components within normal limits  BASIC METABOLIC PANEL - Abnormal; Notable for the following:    Potassium 5.7 (*)    Chloride 96 (*)    Glucose, Bld 153 (*)    Creatinine, Ser 1.53 (*)    GFR calc non Af Amer 42 (*)    GFR  calc Af  Amer 48 (*)    All other components within normal limits  GLUCOSE, CAPILLARY - Abnormal; Notable for the following:    Glucose-Capillary 113 (*)    All other components within normal limits  GLUCOSE, CAPILLARY - Abnormal; Notable for the following:    Glucose-Capillary 112 (*)    All other components within normal limits  DIGOXIN LEVEL  POTASSIUM  I-STAT TROPOININ, ED    EKG  EKG Interpretation  Date/Time:  Tuesday April 30 2016 13:29:44 EST Ventricular Rate:  69 PR Interval:    QRS Duration: 150 QT Interval:  398 QTC Calculation: 426 R Axis:   -120 Text Interpretation:  Atrial fibrillation with premature ventricular or aberrantly conducted complexes Right superior axis deviation Non-specific intra-ventricular conduction block Abnormal ECG No significant change since last tracing Confirmed by Rhunette Croft, MD, Janey Genta ([id number]) on 04/30/2016 6:32:12 PM       Radiology No results found.  Procedures Procedures (including critical care time)  Medications Ordered in ED Medications  furosemide (LASIX) injection 80 mg (80 mg Intravenous Given 04/30/16 1800)     Initial Impression / Assessment and Plan / ED Course  I have reviewed the triage vital signs and the nursing notes.  Pertinent labs & imaging results that were available during my care of the patient were reviewed by me and considered in my medical decision making (see chart for details).     It appears that pt has CHF exacerbation. We will admit. Pt has failed outpatient tx.   Final Clinical Impressions(s) / ED Diagnoses   Final diagnoses:  Acute on chronic systolic congestive heart failure [facility])    New Prescriptions Discharge Medication List as of 05/02/2016 12:43 PM       Derwood Kaplan, MD 05/05/16 0000

## 2016-05-07 LAB — CUP PACEART REMOTE DEVICE CHECK
Battery Remaining Longevity: 114 mo
Battery Remaining Percentage: 100 %
Brady Statistic RV Percent Paced: 4 %
Date Time Interrogation Session: 20180108095100
HighPow Impedance: 69 Ohm
Implantable Lead Implant Date: 20140512
Implantable Lead Location: 753860
Implantable Lead Model: 292
Implantable Lead Serial Number: 126556
Implantable Pulse Generator Implant Date: 20140512
Lead Channel Impedance Value: 388 Ohm
Lead Channel Pacing Threshold Amplitude: 1.1 V
Lead Channel Pacing Threshold Pulse Width: 0.4 ms
Lead Channel Setting Pacing Amplitude: 2.4 V
Lead Channel Setting Pacing Pulse Width: 0.4 ms
Lead Channel Setting Sensing Sensitivity: 0.4 mV
Pulse Gen Serial Number: 106496

## 2016-05-10 ENCOUNTER — Telehealth: Payer: Self-pay | Admitting: Neurology

## 2016-05-10 NOTE — Telephone Encounter (Signed)
Sent to Dr.Sethi the work in [place] for today.

## 2016-05-10 NOTE — Telephone Encounter (Signed)
Rn call [employer]. Rn spoke with Lorin Picket the pharmacist concerning the dosage of the Lamictal titration dosage. Rn stated the pts daughter Claris Che has concerns that her father did not have correct instructions for the lamictal. Scott(pharmacy) read the titration instructions he receive 04/03/2016. Scott stated " 1 tablet twice a day first week, 2 tablets twice a day for second week, 3 tablets twice a day for third week, 4 tablets twice a day for fourth week, at 5th week 100mg  twice a day ongoing". Rn stated to Scott(pharmacist) pts daughter spoke with the pharmacy today, and they gave her different instructions from Dr.Yan. Scott(pharmacist) stated he did not speak with pts daughter this morning.Lorin Picket states he thinks maybe the daughter is reading the instructions wrong. HE states pt has a rx for lamictal 100mg  twice a day. Rn stated Dr. Pearlean Brownie the work in am will call pts daughter.

## 2016-05-10 NOTE — Telephone Encounter (Signed)
Patient's daughter calling to discuss dosage of lamoTRIgine (LAMICTAL) 100 MG tablet for the patient. She is not sure if the patient is taking correct dosage. The patient cannot stand up and also dizzy ever since he starting taking this medication after OV with Dr. Terrace Arabia on 04-03-16.

## 2016-05-10 NOTE — Telephone Encounter (Signed)
I spoke to the patient's daughter who informed me that the patient has not had any seizures for more than 2 years. He had trouble tolerating Keppra due to  agitation which was stopped. He had trouble tolerating Depakote due to tremors which was also stopped. And he has been complaining of dizziness ever since lamotrigine was started a month ago. She would like him to come off that as well. I explained to her that the patient may be at risk for seizures if he is on no seizure medications and I offered to try alternative new agent but could not guarantee that he may not have side effects. She chooses to watch him off all anticonvulsants for now but she is willing to try a new medication in case he has breakthrough seizures. She voiced understanding to the above plan

## 2016-05-10 NOTE — Telephone Encounter (Addendum)
Rn spoke with Judeth Cornfield pts daughter on [place] form. She was calling about the lamictal that was prescribed on 04/03/2016.  Patients daughter stated her father is dizzy and cannot stand up and has fallen while on the medication. Rn stated Dr. Terrace Arabia is out of the office and will return next week. Rn stated pt was on a titration dosage starting 04/03/2016 to 05/04/2016. After the 4th week he was schedule to start taking 100mg  of lamictal 100mg  twice a day. PTs daughter stated "according to her fathers pill box instructions it states take 2 tablets daily for the first week, 3 tablets daily for the second week, 2 tablets daily for the third week. For the fourth week he is take the ongoing dosage of 100mg  twice a day".  Rn gave pts daughter the medication instructions when Dr.Yan prescribed it 04/03/2017. One tablet twice a day  for the first week, two tablets twice a day for second week, three tablets twice a day for third week, 4 tablets twice a day for the fourth week. After the titration dosage on the 5th week he is take 100mg  of lamictal twice a day. Pts daughter the pill bottle dose not have the instructions like I just repeated to her. Pts daughter stated " Also he does not have any refills on the medication and its somebody fault that he does not have the correct instructions and dosage. Rn stated a call will be made to the pharmacy and sent to Dr Pearlean Brownie the work in am. Pts daughter verbalized understanding.

## 2016-05-10 NOTE — Telephone Encounter (Signed)
Call patients daughter Claris Che at  [phone] concerning her father. I spoke with the pharmacy and they have the same titration dosage Dr. Terrace Arabia prescribed. Pts daughter stating her fathers pill bottle states different titration instructions. The titration should have ended 05/04/2016

## 2016-05-10 NOTE — Telephone Encounter (Deleted)
qq

## 2016-05-15 ENCOUNTER — Other Ambulatory Visit: Payer: Self-pay | Admitting: *Deleted

## 2016-05-15 MED ORDER — LAMOTRIGINE 25 MG PO TABS: ORAL_TABLET | ORAL | 0 refills | Status: DC

## 2016-05-15 MED ORDER — LAMOTRIGINE ER 200 MG PO TB24: ORAL_TABLET | ORAL | 11 refills | Status: DC

## 2016-05-15 NOTE — Telephone Encounter (Addendum)
Spoke to patient's daughter and she can help him with his medications.  Per Dr. Terrace Arabia, she would like patient to do the following:  1) Restart lamotrigine titration (new rx sent to pharmacy). 2) After titration, transition to lamotrigine XR 200mg , one tablet at bedtime (lamotrigine 100mg , BID voided at pharmacy). 3) Come in for an appt w/ NP soon - scheduled w/ Megan on 05/27/16.  I have reviewed instructions with his daughter and she verbalized understanding.  She will also explain to her father.  I also called the pharmacy [facility]) and they have made a notation on his account for the pharmacist to discuss the instructions with him again.

## 2016-05-15 NOTE — Telephone Encounter (Signed)
Chart reviewed, patient clearly had recurrent seizures, needs to be on antiepileptic medications, there was confusion about his lamotrigine titration schedule,  Please call patient, may bring him in earlier to nurse practitioner schedule to discuss about medication treatment, potentially check a lamotrigine level,

## 2016-05-23 ENCOUNTER — Ambulatory Visit (INDEPENDENT_AMBULATORY_CARE_PROVIDER_SITE_OTHER): Payer: Medicare HMO | Admitting: Cardiovascular Disease

## 2016-05-23 ENCOUNTER — Encounter: Payer: Self-pay | Admitting: Cardiovascular Disease

## 2016-05-23 VITALS — BP 112/64 | HR 72 | Ht 64.0 in | Wt 157.0 lb

## 2016-05-23 DIAGNOSIS — I5042 Chronic combined systolic (congestive) and diastolic (congestive) heart failure: Secondary | ICD-10-CM | POA: Diagnosis not present

## 2016-05-23 DIAGNOSIS — N183 Chronic kidney disease, stage 3 unspecified: Secondary | ICD-10-CM

## 2016-05-23 DIAGNOSIS — I482 Chronic atrial fibrillation: Secondary | ICD-10-CM | POA: Diagnosis not present

## 2016-05-23 DIAGNOSIS — I255 Ischemic cardiomyopathy: Secondary | ICD-10-CM

## 2016-05-23 DIAGNOSIS — I2510 Atherosclerotic heart disease of native coronary artery without angina pectoris: Secondary | ICD-10-CM

## 2016-05-23 DIAGNOSIS — I5043 Acute on chronic combined systolic (congestive) and diastolic (congestive) heart failure: Secondary | ICD-10-CM

## 2016-05-23 DIAGNOSIS — I4821 Permanent atrial fibrillation: Secondary | ICD-10-CM

## 2016-05-23 DIAGNOSIS — G4733 Obstructive sleep apnea (adult) (pediatric): Secondary | ICD-10-CM

## 2016-05-23 DIAGNOSIS — I5022 Chronic systolic (congestive) heart failure: Secondary | ICD-10-CM

## 2016-05-23 DIAGNOSIS — Z79899 Other long term (current) drug therapy: Secondary | ICD-10-CM

## 2016-05-23 MED ORDER — DIGOXIN 250 MCG PO TABS: ORAL_TABLET | ORAL | 11 refills | Status: AC

## 2016-05-23 MED ORDER — FUROSEMIDE 40 MG PO TABS: ORAL_TABLET | ORAL | 11 refills | Status: AC

## 2016-05-23 MED ORDER — ISOSORBIDE MONONITRATE ER 30 MG PO TB24: 15.0000 mg | ORAL_TABLET | Freq: Every day | ORAL | 1 refills | Status: AC

## 2016-05-23 NOTE — Patient Instructions (Signed)
Medication Instructions:  1.) start new prescription  For isosorbide. This has been sent to your pharmacy. 2.) cut the digoxin ( lanoxin) into 1/2. Take 1/2 tablet daily. 3.) the the morning furosemide the same as before. Take 40 mg @ 4: 00 pm  Labwork:  B-MET BNP in 2 weeks  Testing/Procedures: Your physician has requested that you have an echocardiogram. Echocardiography is a painless test that uses sound waves to create images of your heart. It provides your doctor with information about the size and shape of your heart and how well your heart's chambers and valves are working. This procedure takes approximately one hour. There are no restrictions for this procedure. This will be done at the [address] office.  Your physician has recommended that you have a sleep study. This test records several body functions during sleep, including: brain activity, eye movement, oxygen and carbon dioxide blood levels, heart rate and rhythm, breathing rate and rhythm, the flow of air through your mouth and nose, snoring, body muscle movements, and chest and belly movement.         Follow-Up:  2-3 months with Dr Claiborne Billings  Any Other Special Instructions Will Be Listed Below (If Applicable).

## 2016-05-23 NOTE — Progress Notes (Signed)
Patient ID: [name], male   DOB: 04-30-1934, 82 y.o.   MRN: [id number]     HPI: [name] is a 82 y.o. male who presents to the office for a 3 month cardiology evaluation.  [name] has a history of an ischemic cardiomyopathy. In April 1998 he suffered a large anterior wall myocardial infarction and underwent intervention to the LAD. In April 1999 a stent was placed to the LAD and he had PTCA of his circumflex vessel. In November 2004 he underwent stenting of his RCA.  I had not seen him for a 7 year period but ultimately he presented to me in January 2014. Prior to that, he had been hospitalized after a fall and developed rib fractures and a mild subarachnoid hemorrhage. He also was found to be in permanent atrial fibrillation and had been on Coumadin therapy. When I saw him in February 2014 and nuclear study showed extensive scar entire LAD territory as well as a large portion of the RCA territory with minimal borderline ischemia. Ejection fraction was less than 20% on echo Doppler study in April 2014 consequently I recommended he undergo an ICD implantation. This was done by Dr.Croitoru in May 2014.  Additional problems include hypertension, peripheral edema, Coumadin anticoagulation, and hyperlipidemia. He also has obesity. He has a history of obstructive sleep apnea and is on CPAP therapy.  He does admit to frequent having ecchymoses in his right upper arm. He has purposely lost weight from a maximum weight of 245 to now 176.    He was hospitalized in [facility] in late December 2015  When he was admitted with hypotension, leukocytosis, and cough.  He was treated with vancomycin and Zosyn empirically for possible septicemia.  He had minimal elevation of his troponins.  He was not felt to have an MI.  He underwent a follow-up echo Doppler study at that time on 04/05/2014.  Ejection fraction was 30%.  There was akinesis of the distal lateral, distal anterior, distal inferior, mid,  distal, inferoseptal, and apical walls with diffuse hypokinesis elsewhere.  There was trivial AR, mild MR , and mild left atrial dilatation.  He is a history of permanent atrial fibrillation.  He had experienced a fall and developed an intracranial hemorrhage in 2016 and for this reason, is no longer on anticoagulation therapy.  He underwent an echo Doppler study in July 2016 showed EF of 20-25%.  There was diffuse hypokinesis and akinesis of the anteroseptal and apical myocardium.  There was trivial AR.  There was mitral annular calcification with mild MR and mild left atrial dilatation.  PA pressure was mildly increased at 33 mm.  Carotid Dopplers were done at that time which showed patent vertebral arteries.  There was one of 39% stenosis involving the right internal carotid artery and the left internal carotid artery.  In September 2017, he again lost his balance when trying to catch a grocery cart in the parking lot and had fallen.  He has seen Dr. Sallyanne Kuster for ICD follow-up and was seen last on 01/15/2016.  At that time he was felt to be mildly hypervolemic but well compensated.  His rate control of his atrial fibrillation was suboptimal but blood pressure was limiting dose titration.  He had normal ICD device function with very rare episodes of true nonsustained VT.  Most high ventricular rate episodes were felt to represent atrial fibrillation with rapid ventricular response.  He is undergoing remote downloads every 3 months.  When  I last saw him his BP was on the low side. He had significant LE edema and I was concerned about cellulitis. He was given cephalexin with resolved his cellulitis and lasix was increased to 60 mg in am and 20 mg in the afternoon, and initiated compression stockings. He denies any episodes of chest pain.  H admits to symptoms suggestive of PND. He awakens from sleep with shortness of breath.  He presents for evaluation.   Past Medical History:  Diagnosis Date  .  Arteriosclerotic cardiovascular disease (ASCVD)   . At risk for sudden cardiac death 30-Aug-2012  . Atrial fibrillation ([place])    Onset in 2012  . Atrial flutter ([place])   . Bleeding in brain due to brain aneurysm [facility]) October 06 2014  . CAD (coronary artery disease)    a. s/p DES to LAD in 1999 b. DES to RCA in 2004 c. NST in 2014 showing scar along LAD territory and borderline ischemia along RCA  . CHF (congestive heart failure) ([place])   . Chronic anticoagulation 2012   2012  . COPD (chronic obstructive pulmonary disease) ([place])   . DJD (degenerative joint disease)   . Gout   . Hyperlipidemia   . ICD (implantable cardiac defibrillator) in place   . Ischemic cardiomyopathy   . Kidney stone    "just once" (08/17/2012)  . Myocardial infarction 1998  . NICM (nonischemic cardiomyopathy), EF 25-30% 08-30-2012  . Obstructive sleep apnea    "went away when I lost a bunch of weight" (08/17/2012)  . Permanent atrial fibrillation ([place]) 02/15/2011   Initial onset in 03/2011 with rapid ventricular response   . S/P ICD (internal cardiac defibrillator) procedure, 08/17/12, [employer] 08-30-12   [employer]  . Type II diabetes mellitus ([place])     Past Surgical History:  Procedure Laterality Date  . CARDIAC CATHETERIZATION  02/2003  . CARDIAC DEFIBRILLATOR PLACEMENT  08/17/2012   [employer]  . CATARACT EXTRACTION W/ INTRAOCULAR LENS  IMPLANT, BILATERAL Bilateral ~ 2011  . CHOLECYSTECTOMY  2009  . CORONARY ANGIOPLASTY WITH STENT PLACEMENT  07/14/1996   "1" (08/17/2012)  . CYSTOSCOPY/RETROGRADE/URETEROSCOPY  06/28/2011   Procedure: CYSTOSCOPY/RETROGRADE/URETEROSCOPY;  Surgeon: Marissa Nestle, MD;  Location: [facility];  Service: Urology;  Laterality: Right;  . IMPLANTABLE CARDIOVERTER DEFIBRILLATOR IMPLANT N/A 08/17/2012   Procedure: IMPLANTABLE CARDIOVERTER DEFIBRILLATOR IMPLANT;  Surgeon: Sanda Klein, MD;  Location: [place] CATH LAB;  Service: Cardiovascular;  Laterality: N/A;  . KNEE ARTHROPLASTY  Left 1978   "tendon & cartilege repair" (08/17/2012)  . Myoview perfusion scan  06/02/2012   low risk, extensive scar entire LAD & RCA territory  . s/p icd  08/2012   [employer]  . STONE EXTRACTION WITH BASKET  06/28/2011   Procedure: STONE EXTRACTION WITH BASKET;  Surgeon: Marissa Nestle, MD;  Location: [facility];  Service: Urology;  Laterality: Right;  specimen given to family per MD  . US ECHOCARDIOGRAPHY  07/08/2012   EF <20%,mild MR,TR,LA severely dilated  . VASECTOMY  ~ 1964    Allergies  Allergen Reactions  . Novocain [Procaine] Nausea And Vomiting and Other (See Comments)    Passes out (also)  . Lipitor [Atorvastatin] Other (See Comments)    Myalgias    . Procaine Hcl Nausea And Vomiting  . Tramadol Itching and Nausea And Vomiting    Current Outpatient Prescriptions  Medication Sig Dispense Refill  . aspirin EC 81 MG tablet Take 81 mg by mouth daily.    . Cholecalciferol (VITAMIN D3)  5000 units CAPS Take 1 capsule by mouth daily.    . digoxin (LANOXIN) 0.25 MG tablet Take 1/2 tablet daily 30 tablet 11  . furosemide (LASIX) 40 MG tablet Take 1.5 tablets in the morning and 1/2 tablet @ 4 60 tablet 11  . lamoTRIgine (LAMICTAL) 25 MG tablet 1 tablet twice a day for 7 days, then 2 tablets twice a day for 7 days, then 3 tablets twice a day for 7 days, then Start new prescription for lamotrigine XR '200mg'$ , one tablet at bedtime. 84 tablet 0  . LamoTRIgine XR 200 MG TB24 Take one tablet at bedtime. 30 tablet 11  . magnesium oxide (MAG-OX) 400 MG tablet Take 1 tablet (400 mg total) by mouth 2 (two) times daily. 60 tablet 11  . metFORMIN (GLUCOPHAGE) 500 MG tablet Take 500 mg by mouth 2 (two) times daily with a meal.     . potassium chloride (K-DUR) 10 MEQ tablet Take 1 tablet (10 mEq total) by mouth daily. 30 tablet 11  . pravastatin (PRAVACHOL) 40 MG tablet Take 1 tablet (40 mg total) by mouth daily. 30 tablet 11  . isosorbide mononitrate (IMDUR) 30 MG 24 hr tablet Take 0.5  tablets (15 mg total) by mouth at bedtime. 30 tablet 1   No current facility-administered medications for this visit.     Social History   Social History  . Marital status: Married    Spouse name: N/A  . Number of children: 3  . Years of education: 9th   Occupational History  . Retired    Social History Main Topics  . Smoking status: Former Smoker    Packs/day: 2.00    Years: 40.00    Types: Cigarettes    Quit date: 04/08/1992  . Smokeless tobacco: Former Systems developer  . Alcohol use No     Comment: 08/17/2012 "quit drinking in 1983"  . Drug use: No  . Sexual activity: No   Other Topics Concern  . Not on file   Social History Narrative   Lives at home with his wife.   Right-handed.   No caffeine use.    Family History  Problem Relation Age of Onset  . Heart failure Mother   . Heart failure Father     ROS General: Negative; No fevers, chills, or night sweats; purposeful weight loss from 245 pounds to 175 pounds. HEENT: Negative; No changes in vision or hearing, sinus congestion, difficulty swallowing Pulmonary: Negative; No cough, wheezing, shortness of breath, hemoptysis Cardiovascular:See history of present illness GI: Negative; No nausea, vomiting, diarrhea, or abdominal pain GU: Positive for kidney stone; No dysuria, hematuria, or difficulty voiding Musculoskeletal: Negative; no myalgias, joint pain, or weakness Hematologic/Oncology: Negative; no easy bruising, bleeding Endocrine: Positive for diabetes mellitus; no heat/cold intolerance; no diabetes Neuro: Negative; no changes in balance, headaches Skin: Negative; No rashes or skin lesions Psychiatric: Negative; No behavioral problems, depression Sleep: Positive for sleep apnea on CPAP therapy.; No snoring, daytime sleepiness, hypersomnolence, bruxism, restless legs, hypnogognic hallucinations, no cataplexy Other comprehensive 14 point system review is negative.   PE BP 112/64   Pulse 72   Ht '5\' 4"'$  (1.626 m)    Wt 157 lb (71.2 kg)   BMI 26.95 kg/m    Repeat blood pressure by me was 98/64 supine and 98/60 standing.  Wt Readings from Last 3 Encounters:  05/23/16 157 lb (71.2 kg)  05/02/16 156 lb 6.4 oz (70.9 kg)  04/23/16 165 lb (74.8 kg)   Over the past 2  years.  He admits to an 85-100 pound weight loss.  General: Alert, oriented, no distress.  Skin: normal turgor, no rashes; area of ecchymosis in the right upper arm HEENT: Normocephalic, atraumatic. Pupils round and reactive; sclera anicteric;no lid lag.  Nose without nasal septal hypertrophy Mouth/Parynx benign; Mallinpatti scale 3; mild arterial narrowing without hemorrhages or exudate Neck: No JVD, no carotid bruits with normal carotid upstroke Lungs: clear to ausculatation and percussion; no wheezing or rales Chest wall: no tenderness to palpitation Heart: RRR, s1 s2 normal 1/6 systolic murmur; no S3 gallop.  No diastolic murmur, rubs, thrills or heaves. Abdomen: soft, nontender; no hepatosplenomehaly, BS+; abdominal aorta nontender and not dilated by palpation. Back: no CVA tenderness Pulses 2+ Extremities:  Improvement in edema with resolution of erythema;  no clubbing cyanosis, Homan's sign negative  Neurologic: grossly nonfocal Psychologic: normal affect and mood.  ECG (independently read by me): Atrial fibrillation at 72 bpm.  Nonspecific interventricular block.  Poor anterior R-wave progression.  Right axis deviation.  November 2017 ECG (independently read by me): Atrial fibrillation at 80 bpm.  Nonspecific T changes.  QTc interval 419 ms.  February 2016 ECG (independently read by me): Atrial fibrillation at 72 bpm. Nonspecific IVCD. Mild T wave abdomen.  These inferiorly.  September 2015ECG (independently read by me): Permanent atrial fibrillation with a ventricular rate of 78 beats per minute.  Nonspecific ST-T changes.  Appropriate pacing.  Prior ECG: Underlying atrial fibrillation with appropriate Ventricular pacing and  sensing.  LABS:  BMP Latest Ref Rng & Units 05/02/2016 05/02/2016 05/01/2016  Glucose 65 - 99 mg/dL - 153(H) 150(H)  BUN 6 - 20 mg/dL - 18 19  Creatinine 0.61 - 1.24 mg/dL - 1.53(H) 1.49(H)  Sodium 135 - 145 mmol/L - 137 136  Potassium 3.5 - 5.1 mmol/L 4.1 5.7(H) 3.4(L)  Chloride 101 - 111 mmol/L - 96(L) 97(L)  CO2 22 - 32 mmol/L - 31 28  Calcium 8.9 - 10.3 mg/dL - 9.9 9.7    Hepatic Function Latest Ref Rng & Units 01/15/2016 12/22/2015 07/12/2015  Total Protein 6.1 - 8.1 g/dL 5.7(L) 6.5 7.2  Albumin 3.6 - 5.1 g/dL 3.6 4.1 4.3  AST 10 - 35 U/L 21 55(H) 22  ALT 9 - 46 U/L 12 21 13(L)  Alk Phosphatase 40 - 115 U/L 48 49 55  Total Bilirubin 0.2 - 1.2 mg/dL 0.6 2.2(H) 1.2  Bilirubin, Direct 0.0 - 0.3 mg/dL - - -    CBC Latest Ref Rng & Units 04/30/2016 04/23/2016 12/22/2015  WBC 4.0 - 10.5 K/uL 8.1 9.1 13.1(H)  Hemoglobin 13.0 - 17.0 g/dL 15.0 16.6 15.8  Hematocrit 39.0 - 52.0 % 44.7 50.7 48.3  Platelets 150 - 400 K/uL 137(L) 159 152   Lab Results  Component Value Date   MCV 101.4 (H) 04/30/2016   MCV 103.9 (H) 04/23/2016   MCV 102.8 (H) 12/22/2015   Lab Results  Component Value Date   TSH 2.100 11/28/2015   BNP    Component Value Date/Time   PROBNP 3,742.0 (H) 03/25/2012 0513    Lipid Panel     Component Value Date/Time   CHOL 101 (L) 01/15/2016 1057   TRIG 97 01/15/2016 1057   HDL 36 (L) 01/15/2016 1057   CHOLHDL 2.8 01/15/2016 1057   VLDL 19 01/15/2016 1057   LDLCALC 46 01/15/2016 1057     RADIOLOGY: No results found.  IMPRESSION:  1. Arteriosclerotic cardiovascular disease (ASCVD)   2. Permanent atrial fibrillation (HCC)   3. Cardiomyopathy,  ischemic   4. Chronic combined systolic and diastolic CHF, NYHA class 2 ([place])   5. Coronary artery disease involving native coronary artery of native heart without angina pectoris   6. Chronic systolic heart failure ([place])   7. Acute on chronic combined systolic and diastolic CHF (congestive heart failure) ([place])   8.  Obstructive sleep apnea   9. CKD (chronic kidney disease) stage 3, GFR 30-59 ml/min   10. Drug therapy     ASSESSMENT AND PLAN: Mr.Chauvin is a very pleasant 82 year old gentleman who has a history of an ischemic cardiomyopathy.  He  suffered his initial anterior wall myocardial infarction in 1998. He subsequently developed permanent atrial fibrillation and  underwent ICD implantation by Dr. Sallyanne Kuster with a single chamber ICD for primary prevention per Maditt II criteria .  He now is permanent atrial fibrillation.  Ventricular rate is well-controlled on ECG today.   He had experienced several falls in 2016 developed an intracranial hemorrhage.  He's been off anticoagulation.  His blood pressure has remained on the low side, which is limited medication titration.  When I last saw him he hadredness and erythema in both lower extremities, left greater than right, suggesting the possibility of cellulitis.   He completed a course cephalexin  with resolution. He has been on lasix at 60 mg am and 20 mg in the afternoon and has worn compression stockings. With his PND I am further titrating his pm dose to 40 mg and also adding isosorbide 15 mg at bedtime.  His BP is low, and further titration may be needed if his BP will allow.He has stage 3 CKD with last Cr 1.53.   I am decreasing digoxin to 0.125 mg. I am recommending a f/u echo to re-assess LV fxn.  I will also schedule him for a f/u sleep study to assess for complex sleep apnea. Repeat laboratory will be obtained with a BMET and BNP. He continues to be on pravastatin gfor hyperlipidemia with target LDL < 70. I will see him in 2-3 months for further evaluation.   Time spent: 25 minutes  Troy Sine, MD, [facility]  05/25/2016 1:42 PM

## 2016-05-27 ENCOUNTER — Encounter: Payer: Self-pay | Admitting: Adult Health

## 2016-05-27 ENCOUNTER — Ambulatory Visit (INDEPENDENT_AMBULATORY_CARE_PROVIDER_SITE_OTHER): Payer: Medicare HMO | Admitting: Adult Health

## 2016-05-27 VITALS — BP 102/59 | HR 86 | Ht 64.0 in | Wt 154.8 lb

## 2016-05-27 DIAGNOSIS — R569 Unspecified convulsions: Secondary | ICD-10-CM

## 2016-05-27 DIAGNOSIS — Z8679 Personal history of other diseases of the circulatory system: Secondary | ICD-10-CM | POA: Diagnosis not present

## 2016-05-27 DIAGNOSIS — W19XXXA Unspecified fall, initial encounter: Secondary | ICD-10-CM

## 2016-05-27 NOTE — Patient Instructions (Signed)
Restart Lamictal If your symptoms worsen or you develop new symptoms please let us know.

## 2016-05-27 NOTE — Progress Notes (Signed)
PATIENT: [name] DOB: [birth year]  REASON FOR VISIT: follow up HISTORY FROM: patient  HISTORY OF PRESENT ILLNESS: HISTORY per Dr. Terrace Arabia: [name] 82 yo RH, wife, daughter, , seen in refer by his primary care physician Oval Linsey for seizure, he is accompanied by his wife, and daughter Claris Che at today's clinical visit  He had past medical history of hypertension, hyperlipidemia, diabetes, coronary artery disease, status post stent, atrial fibrillation, was taking coumadin, stopped in June 30th 2016, not on anticoagulation at this point,  He also had a history of ICD, not MRI candidate, ischemic congestive heart failure, ejection fraction 25-30%.  In October 06 2014, he fell backwards sitting in the the chair, with transient loss of consciousness for a few minutes, later he was noted by family member to have confusion, could not recognize his friend, he was taken by ambulance to [facility] on October 06 2014, I have reviewed at CAT scan of the brain, mild generalized atrophy, more at bilateral frontal region, no acute intracranial abnormality. He also suffered left fifth rib fracture by chest x-ray.  He was discharged home, on July second 2016, he began to complains of worsening diffuse headaches, presented to local emergency room, I have reviewed CAT scan on July 2nd 2016, there was diffuse bilateral frontal atrophy, no acute lesions.   On July third 2016, while watching TV, he had his first seizure, eyes rolled back, right hand drawn up followed by loss of consciousness tonic-clonic movement.   He had recurrent seizure on October 16 2014, again presented to the emergency room, repeat CAT scan in October 16 2014 showed mixed the density subdural hematoma on the left, 6 mm in the left frontal region.  5 mm right frontal subdural hematoma. EEG was normal October 17 2014 Due to subdural hematoma, Coumadin was stopped, the plan was to start Eliquis in one wee.  Most recent seizure  was October 20 2014, started from his right hand, difficulty controlling his right hand, followed by loss of consciousness, right hand arm contraction, post event confusion lasted 10-15 minutes, he is now taking Keppra 1000 mg twice a day, tolerating it well, he has no recurrent seizure  UPDATE August 22nd 2017: He came in with his wife and daughter at today's clinical visit, last visit was in August 2016, he has lost follow-up, he had a history of atrial fibrillation, his cardiologist is [name], he is no longer on anticoagulation or antiplatelet agent, there was no recurrent seizure, he is still taking Keppra 1000 mg twice a day, he complains of agitation, could not sleep, he contributed to the side effect of Keppra,  I reviewed his emergency room visit in April 2017, for similar complaints, I personally reviewed CT head without contrast in April 2017, generalized atrophy, periventricular small vessel disease, no interval change compared to August 2016.  UPDATE Dec 27th 2017: He could not tolerate Depakote, while taking 1000 units every night, he complains of hand shaking, could not not keep his left hand still on steering wheel, which has improved after he stop taking depakote,    He is doing well,  His daughter is going chemotherapy.   Today 05/27/2016:  [name] is a 82 year old male with a history of seizures and subarachnoid hemorrhage. He returns today for follow-up. He states that he took the first month of Lamictal but was unsure how to take the medication after that. He has not been on Lamictal for several weeks. Dr.  yan sent in a new prescription February 7. He has not started this yet. He denies any recent seizures .his wife and daughter confirm that he is not had any seizure events. He lives at home with his wife. He is able to complete all ADLs independently. Him and  his wife eat out at restaurants for most of his meals. He does operate a motor vehicle without difficulty. The patient  reports that he had a fall 3 weeks ago at [employer]. He states that he has a bruise/knot on his right side and on the right side of the head in the occipital region. The patient did not into the emergency room after the fall. He is on aspirin. He denies any new neurological symptoms. He returns today for an evaluation.  REVIEW OF SYSTEMS: Out of a complete 14 system review of symptoms, the patient complains only of the following symptoms, and all other reviewed systems are negative. Constipation, nausea, restless leg, insomnia, snoring, walking difficulty, weakness, shortness of breath, chest tightness, leg swelling, runny nose, fatigue ALLERGIES: Allergies  Allergen Reactions  . Novocain [Procaine] Nausea And Vomiting and Other (See Comments)    Passes out (also)  . Lipitor [Atorvastatin] Other (See Comments)    Myalgias    . Procaine Hcl Nausea And Vomiting  . Tramadol Itching and Nausea And Vomiting    HOME MEDICATIONS: Outpatient Medications Prior to Visit  Medication Sig Dispense Refill  . aspirin EC 81 MG tablet Take 81 mg by mouth daily.    . Cholecalciferol (VITAMIN D3) 5000 units CAPS Take 1 capsule by mouth daily.    . digoxin (LANOXIN) 0.25 MG tablet Take 1/2 tablet daily 30 tablet 11  . furosemide (LASIX) 40 MG tablet Take 1.5 tablets in the morning and 1/2 tablet @ 4 60 tablet 11  . isosorbide mononitrate (IMDUR) 30 MG 24 hr tablet Take 0.5 tablets (15 mg total) by mouth at bedtime. 30 tablet 1  . magnesium oxide (MAG-OX) 400 MG tablet Take 1 tablet (400 mg total) by mouth 2 (two) times daily. 60 tablet 11  . metFORMIN (GLUCOPHAGE) 500 MG tablet Take 500 mg by mouth 2 (two) times daily with a meal.     . potassium chloride (K-DUR) 10 MEQ tablet Take 1 tablet (10 mEq total) by mouth daily. 30 tablet 11  . pravastatin (PRAVACHOL) 40 MG tablet Take 1 tablet (40 mg total) by mouth daily. 30 tablet 11  . lamoTRIgine (LAMICTAL) 25 MG tablet 1 tablet twice a day for 7 days, then 2  tablets twice a day for 7 days, then 3 tablets twice a day for 7 days, then Start new prescription for lamotrigine XR 200mg , one tablet at bedtime. (Patient not taking: Reported on 05/27/2016) 84 tablet 0  . LamoTRIgine XR 200 MG TB24 Take one tablet at bedtime. (Patient not taking: Reported on 05/27/2016) 30 tablet 11   No facility-administered medications prior to visit.     PAST MEDICAL HISTORY: Past Medical History:  Diagnosis Date  . Arteriosclerotic cardiovascular disease (ASCVD)   . At risk for sudden cardiac death September 06, 2012  . Atrial fibrillation (HCC)    Onset in 2012  . Atrial flutter (HCC)   . Bleeding in brain due to brain aneurysm [facility]) October 06 2014  . CAD (coronary artery disease)    a. s/p DES to LAD in 1999 b. DES to RCA in 2004 c. NST in 2014 showing scar along LAD territory and borderline ischemia along RCA  . CHF (  congestive heart failure) (HCC)   . Chronic anticoagulation 2012   2012  . COPD (chronic obstructive pulmonary disease) (HCC)   . DJD (degenerative joint disease)   . Gout   . Hyperlipidemia   . ICD (implantable cardiac defibrillator) in place   . Ischemic cardiomyopathy   . Kidney stone    "just once" (08/17/2012)  . Myocardial infarction 1998  . NICM (nonischemic cardiomyopathy), EF 25-30% 08/18/2012  . Obstructive sleep apnea    "went away when I lost a bunch of weight" (08/17/2012)  . Permanent atrial fibrillation (HCC) 02/15/2011   Initial onset in 03/2011 with rapid ventricular response   . S/P ICD (internal cardiac defibrillator) procedure, 08/17/12, [employer] 08/18/2012   [employer]  . Type II diabetes mellitus (HCC)     PAST SURGICAL HISTORY: Past Surgical History:  Procedure Laterality Date  . CARDIAC CATHETERIZATION  02/2003  . CARDIAC DEFIBRILLATOR PLACEMENT  08/17/2012   [employer]  . CATARACT EXTRACTION W/ INTRAOCULAR LENS  IMPLANT, BILATERAL Bilateral ~ 2011  . CHOLECYSTECTOMY  2009  . CORONARY ANGIOPLASTY WITH STENT  PLACEMENT  07/14/1996   "1" (08/17/2012)  . CYSTOSCOPY/RETROGRADE/URETEROSCOPY  06/28/2011   Procedure: CYSTOSCOPY/RETROGRADE/URETEROSCOPY;  Surgeon: Ky Barban, MD;  Location: [facility] OR[facility];  Service: Urology;  Laterality: Right;  . IMPLANTABLE CARDIOVERTER DEFIBRILLATOR IMPLANT N/A 08/17/2012   Procedure: IMPLANTABLE CARDIOVERTER DEFIBRILLATOR IMPLANT;  Surgeon: Thurmon Fair, MD;  Location: [facility] CATH LAB;  Service: Cardiovascular;  Laterality: N/A;  . KNEE ARTHROPLASTY Left 1978   "tendon & cartilege repair" (08/17/2012)  . Myoview perfusion scan  06/02/2012   low risk, extensive scar entire LAD & RCA territory  . s/p icd  08/2012   [employer]  . STONE EXTRACTION WITH BASKET  06/28/2011   Procedure: STONE EXTRACTION WITH BASKET;  Surgeon: Ky Barban, MD;  Location: [facility];  Service: Urology;  Laterality: Right;  specimen given to family per MD  . US ECHOCARDIOGRAPHY  07/08/2012   EF <20%,mild MR,TR,LA severely dilated  . VASECTOMY  ~ 1964    FAMILY HISTORY: Family History  Problem Relation Age of Onset  . Heart failure Mother   . Heart failure Father     SOCIAL HISTORY: Social History   Social History  . Marital status: Married    Spouse name: N/A  . Number of children: 3  . Years of education: 9th   Occupational History  . Retired    Social History Main Topics  . Smoking status: Former Smoker    Packs/day: 2.00    Years: 40.00    Types: Cigarettes    Quit date: 04/08/1992  . Smokeless tobacco: Former Neurosurgeon  . Alcohol use No     Comment: 08/17/2012 "quit drinking in 1983"  . Drug use: No  . Sexual activity: No   Other Topics Concern  . Not on file   Social History Narrative   Lives at home with his wife.   Right-handed.   No caffeine use.      PHYSICAL EXAM  Vitals:   05/27/16 0944  BP: (!) 102/59  Pulse: 86  Weight: 154 lb 12.8 oz (70.2 kg)  Height: 5\' 4"  (1.626 m)   Body mass index is 26.57 kg/m.  Generalized: Well developed, in no acute  distress   Neurological examination  Mentation: Alert oriented to time, place, history taking. Follows all commands speech and language fluent Cranial nerve II-XII: Pupils were equal round reactive to light. Extraocular movements were full, visual field were full  on confrontational test. Facial sensation and strength were normal. Uvula tongue midline. Head turning and shoulder shrug  were normal and symmetric. Motor: The motor testing reveals 5 over 5 strength of all 4 extremities. Good symmetric motor tone is noted throughout.  Sensory: Sensory testing is intact to soft touch on all 4 extremities. No evidence of extinction is noted.  Coordination: Cerebellar testing reveals good finger-nose-finger and heel-to-shin bilaterally.  Gait and station: Gait is normal.   Reflexes: Deep tendon reflexes are symmetric and normal bilaterally.   DIAGNOSTIC DATA (LABS, IMAGING, TESTING) - I reviewed patient records, labs, notes, testing and imaging myself where available.  Lab Results  Component Value Date   WBC 8.1 04/30/2016   HGB 15.0 04/30/2016   HCT 44.7 04/30/2016   MCV 101.4 (H) 04/30/2016   PLT 137 (L) 04/30/2016      Component Value Date/Time   NA 137 05/02/2016 0427   K 4.1 05/02/2016 1104   CL 96 (L) 05/02/2016 0427   CO2 31 05/02/2016 0427   GLUCOSE 153 (H) 05/02/2016 0427   BUN 18 05/02/2016 0427   CREATININE 1.53 (H) 05/02/2016 0427   CREATININE 1.35 (H) 01/15/2016 1057   CALCIUM 9.9 05/02/2016 0427   PROT 5.7 (L) 01/15/2016 1057   ALBUMIN 3.6 01/15/2016 1057   AST 21 01/15/2016 1057   ALT 12 01/15/2016 1057   ALKPHOS 48 01/15/2016 1057   BILITOT 0.6 01/15/2016 1057   GFRNONAA 42 (L) 05/02/2016 0427   GFRAA 48 (L) 05/02/2016 0427   Lab Results  Component Value Date   CHOL 101 (L) 01/15/2016   HDL 36 (L) 01/15/2016   LDLCALC 46 01/15/2016   TRIG 97 01/15/2016   CHOLHDL 2.8 01/15/2016   Lab Results  Component Value Date   HGBA1C 6.4 (H) 05/01/2016   Lab Results    Component Value Date   VITAMINB12 46 (L) 11/28/2015   Lab Results  Component Value Date   TSH 2.100 11/28/2015      ASSESSMENT AND PLAN 82 y.o. year old male  has a past medical history of Arteriosclerotic cardiovascular disease (ASCVD); At risk for sudden cardiac death (2012/09/03); Atrial fibrillation (HCC); Atrial flutter (HCC); Bleeding in brain due to brain aneurysm [facility]) (October 06 2014); CAD (coronary artery disease); CHF (congestive heart failure) (HCC); Chronic anticoagulation (2012); COPD (chronic obstructive pulmonary disease) (HCC); DJD (degenerative joint disease); Gout; Hyperlipidemia; ICD (implantable cardiac defibrillator) in place; Ischemic cardiomyopathy; Kidney stone; Myocardial infarction (1998); NICM (nonischemic cardiomyopathy), EF 25-30% (09-03-12); Obstructive sleep apnea; Permanent atrial fibrillation (HCC) (02/15/2011); S/P ICD (internal cardiac defibrillator) procedure, 08/17/12, [employer] (2012/09/03); and Type II diabetes mellitus (HCC). here with:  1. Seizures 2. History of subarachnoid hemorrhage 3. Fall  Patient denies any recent seizure events. He will restart Lamictal. I have reviewed the titration schedule with the patient and his wife and daughter. I recommended a CT of the head due to his fall 3 weeks ago. However the patient refused. Patient advised that if he is having frequent falls he may need to be referred to physical therapy and perhaps use an assistive device when ambulating. The patient deferred for now. The patient should also monitor the knot on the right side of the abdomen. If this worsens he should follow-up with his primary care provider. The patient refused any additional testing at this time. He is advised that if his symptoms worsen or he develops new symptoms he should let us know. Will follow-up in 6 months with Dr. Terrace Arabia.  Butch Penny, MSN, NP-C 05/27/2016, 10:09 AM [facility] [address] [place], [place] [address] [phone]

## 2016-05-28 ENCOUNTER — Telehealth: Payer: Self-pay | Admitting: Neurology

## 2016-05-28 NOTE — Telephone Encounter (Signed)
Clide Cliff (case manager with [employer]) called in reference to medications lamoTRIgine (LAMICTAL) 25 MG tablet and LamoTRIgine XR 200 MG TB24.  Patients insurance has changed patient to a tier 3 increasing the cost of the medication to $47 monthly along with a $200 deducible amount patient will have to pay out of pocket.  Corrie Dandy is requesting if we are able to contact Prior Auth department with [facility] [place] at [phone] and request a tier reduction based on patients hx and monthly income.  Please call

## 2016-05-28 NOTE — Telephone Encounter (Signed)
I received approval for lamotrigine ER at Tier one for pt (medically necessary) for one yr from this date.  [employer] [facility]  To send letter.

## 2016-05-29 MED ORDER — LAMOTRIGINE 25 MG PO TABS: ORAL_TABLET | ORAL | 0 refills | Status: AC

## 2016-05-29 MED ORDER — LAMOTRIGINE ER 200 MG PO TB24: ORAL_TABLET | ORAL | 1 refills | Status: AC

## 2016-05-29 NOTE — Telephone Encounter (Signed)
Received letter.

## 2016-05-29 NOTE — Telephone Encounter (Signed)
[name] with [facility] is returning nurse's call.  Just asking that medication be called into [facility] phone # [phone] ( not sure which location) 90 day supply and that patient be notified.

## 2016-05-29 NOTE — Addendum Note (Signed)
Addended byHermenia Fiscal on: 05/29/2016 04:10 PM   Modules accepted: Orders

## 2016-05-29 NOTE — Telephone Encounter (Addendum)
LMVM for pt on home #, about  Which pharmacy.  I had [facility], is [facility] the one they want?   I went ahead and redid prescription to the [facility] in [place]. (both the titration and the maintenance).  I spoke to pharmacy and relayed that pt is using [facility] and to cancel his lamictal prescriptions.  Pt picked up the titration and will and will get the lamictal 200 ER at [facility].

## 2016-05-30 ENCOUNTER — Telehealth: Payer: Self-pay | Admitting: Cardiovascular Disease

## 2016-05-30 DIAGNOSIS — Z79899 Other long term (current) drug therapy: Secondary | ICD-10-CM

## 2016-05-30 DIAGNOSIS — R0602 Shortness of breath: Secondary | ICD-10-CM

## 2016-05-30 NOTE — Telephone Encounter (Signed)
Daughter called back in She states her dad has lab orders already  Explained that they were given to him at his last appt for him to have done in 2 weeks (next week) Advised to go ahead and have BMET & BNP (reordered to be processed stat given acute concerns, renal fxn, swelling)  Routed to [name] & Burna Mortimer

## 2016-05-30 NOTE — Telephone Encounter (Signed)
Called patient's daughter as STAT BMET & BNP had not resulted yet. She states patient was supposed to have already gone to lab to have blood work, she is unsure if he has gone or not. She states she will contact him about this. Advised that it is likely that MD would want to see lab results in order to determine if medications can be adjusted further given his renal function

## 2016-05-30 NOTE — Telephone Encounter (Signed)
New Message   Per daughter Dr C told pt to take two lasix  Pt c/o swelling: STAT is pt has developed SOB within 24 hours  1. How long have you been experiencing swelling? always  2. Where is the swelling located? Feet and legs   3.  Are you currently taking a "fluid pill"? Yes took 2   4.  Are you currently SOB? Always haves shortness of breathe  5.  Have you traveled recently? No   Pt took 2 lasix and potassium  8am and still has not urinated, fluid in his legs are not going down

## 2016-05-30 NOTE — Telephone Encounter (Signed)
Spoke with patient's daughter Patient has not urinated since 6am today Patient's daughter states he took 2 - 40mg  tablets this AM & since last appt Per our records, patient is to take 1.5 tablets x40mg  QAM and 40mg  @ 4pm (PM dose increased 2/15) Patient's daughter states he is still having swelling in LE  Weight is around 145lbs - she does not think he has gained any weight Patient is always SOB per daughter - gasping per daughter  Patient was ordered to have BMET & BNP done 2 weeks after med change - has CKD as well  Will speak with [name] for advice.  Daughter can take patient for labs today if needed

## 2016-05-31 LAB — BASIC METABOLIC PANEL
BUN: 14 mg/dL (ref 7–25)
CALCIUM: 9.5 mg/dL (ref 8.6–10.3)
CO2: 29 mmol/L (ref 20–31)
Chloride: 99 mmol/L (ref 98–110)
Creat: 1.32 mg/dL — ABNORMAL HIGH (ref 0.70–1.18)
Glucose, Bld: 121 mg/dL — ABNORMAL HIGH (ref 65–99)
POTASSIUM: 4.1 mmol/L (ref 3.5–5.3)
Sodium: 139 mmol/L (ref 135–146)

## 2016-05-31 LAB — BRAIN NATRIURETIC PEPTIDE: BRAIN NATRIURETIC PEPTIDE: 1104.3 pg/mL — AB (ref <100)

## 2016-05-31 NOTE — Telephone Encounter (Signed)
Patient of [name] (& Dr. Royann Shivers for device) Patient had STAT BMET & BNP done yesterday - results are now in EPIC  LM for daughter to return call w/update on patient's status.   Called patient - he states he did not go to the bathroom much yesterday but has gone a lot today He states his swelling has improved some When asked about his breathing, he reports at night for the first 2-3 hours he sleeps OK and then he has to sit up on the sit of the bed and breathe and then will drink ice water and go back to bed. He states this is "somewhat better". Patient states his weight is 150-152lbs - this is stable per patient   Patient states he takes 1.5 tablets (60mg ) of lasix QAM and 0.5 tablets (20mg ) 3-4pm  Per note 2/15 - was to take lasix 40mg  QPM  Advised patient will have MD review his labs and then he will be contacted w/instructions. He voiced understanding. OK to leave message

## 2016-05-31 NOTE — Telephone Encounter (Signed)
Labs look great. His symptoms and BNP show he is still "wet". Take 60 mg furosemide QAM and 40 mg furosemide QPM and touch base again on Monday.

## 2016-06-03 NOTE — Telephone Encounter (Signed)
Follow Up ° ° °Pt calling back for results °

## 2016-06-03 NOTE — Telephone Encounter (Signed)
Tried to call pt phone just keeps ringing will have to try again later

## 2016-06-03 NOTE — Telephone Encounter (Signed)
Late entry spoke to patient patient still indicated some swelling in lower legs , he keeping legs up - recliner  Patient states he taking 60 mg in morning and 40 mg in evening

## 2016-06-03 NOTE — Telephone Encounter (Signed)
LMTCB

## 2016-06-07 ENCOUNTER — Encounter: Payer: Self-pay | Admitting: *Deleted

## 2016-06-09 ENCOUNTER — Inpatient Hospital Stay (HOSPITAL_COMMUNITY)
Admission: EM | Admit: 2016-06-09 | Discharge: 2016-07-07 | DRG: 291 | Disposition: E | Payer: Medicare HMO | Attending: Pulmonary Disease | Admitting: Pulmonary Disease

## 2016-06-09 ENCOUNTER — Emergency Department (HOSPITAL_COMMUNITY): Payer: Medicare HMO

## 2016-06-09 ENCOUNTER — Encounter (HOSPITAL_COMMUNITY): Payer: Self-pay | Admitting: *Deleted

## 2016-06-09 DIAGNOSIS — I5022 Chronic systolic (congestive) heart failure: Secondary | ICD-10-CM | POA: Diagnosis present

## 2016-06-09 DIAGNOSIS — N183 Chronic kidney disease, stage 3 (moderate): Secondary | ICD-10-CM | POA: Diagnosis present

## 2016-06-09 DIAGNOSIS — Z9842 Cataract extraction status, left eye: Secondary | ICD-10-CM

## 2016-06-09 DIAGNOSIS — I255 Ischemic cardiomyopathy: Secondary | ICD-10-CM | POA: Diagnosis not present

## 2016-06-09 DIAGNOSIS — Z961 Presence of intraocular lens: Secondary | ICD-10-CM | POA: Diagnosis present

## 2016-06-09 DIAGNOSIS — F4323 Adjustment disorder with mixed anxiety and depressed mood: Secondary | ICD-10-CM | POA: Diagnosis present

## 2016-06-09 DIAGNOSIS — R570 Cardiogenic shock: Secondary | ICD-10-CM | POA: Diagnosis not present

## 2016-06-09 DIAGNOSIS — A4101 Sepsis due to Methicillin susceptible Staphylococcus aureus: Secondary | ICD-10-CM | POA: Diagnosis not present

## 2016-06-09 DIAGNOSIS — K7200 Acute and subacute hepatic failure without coma: Secondary | ICD-10-CM | POA: Diagnosis not present

## 2016-06-09 DIAGNOSIS — J9600 Acute respiratory failure, unspecified whether with hypoxia or hypercapnia: Secondary | ICD-10-CM

## 2016-06-09 DIAGNOSIS — S0990XA Unspecified injury of head, initial encounter: Secondary | ICD-10-CM | POA: Diagnosis not present

## 2016-06-09 DIAGNOSIS — A412 Sepsis due to unspecified staphylococcus: Secondary | ICD-10-CM | POA: Diagnosis not present

## 2016-06-09 DIAGNOSIS — I482 Chronic atrial fibrillation: Secondary | ICD-10-CM | POA: Diagnosis not present

## 2016-06-09 DIAGNOSIS — I447 Left bundle-branch block, unspecified: Secondary | ICD-10-CM | POA: Diagnosis present

## 2016-06-09 DIAGNOSIS — Z87891 Personal history of nicotine dependence: Secondary | ICD-10-CM

## 2016-06-09 DIAGNOSIS — Z4659 Encounter for fitting and adjustment of other gastrointestinal appliance and device: Secondary | ICD-10-CM

## 2016-06-09 DIAGNOSIS — J15211 Pneumonia due to Methicillin susceptible Staphylococcus aureus: Secondary | ICD-10-CM | POA: Diagnosis not present

## 2016-06-09 DIAGNOSIS — N170 Acute kidney failure with tubular necrosis: Secondary | ICD-10-CM

## 2016-06-09 DIAGNOSIS — Z66 Do not resuscitate: Secondary | ICD-10-CM | POA: Diagnosis not present

## 2016-06-09 DIAGNOSIS — Z95810 Presence of automatic (implantable) cardiac defibrillator: Secondary | ICD-10-CM | POA: Diagnosis not present

## 2016-06-09 DIAGNOSIS — W19XXXA Unspecified fall, initial encounter: Secondary | ICD-10-CM

## 2016-06-09 DIAGNOSIS — E876 Hypokalemia: Secondary | ICD-10-CM | POA: Diagnosis present

## 2016-06-09 DIAGNOSIS — R6521 Severe sepsis with septic shock: Secondary | ICD-10-CM | POA: Diagnosis not present

## 2016-06-09 DIAGNOSIS — Z9841 Cataract extraction status, right eye: Secondary | ICD-10-CM

## 2016-06-09 DIAGNOSIS — R188 Other ascites: Secondary | ICD-10-CM | POA: Diagnosis present

## 2016-06-09 DIAGNOSIS — R600 Localized edema: Secondary | ICD-10-CM | POA: Diagnosis not present

## 2016-06-09 DIAGNOSIS — Z9911 Dependence on respirator [ventilator] status: Secondary | ICD-10-CM | POA: Diagnosis not present

## 2016-06-09 DIAGNOSIS — E1165 Type 2 diabetes mellitus with hyperglycemia: Secondary | ICD-10-CM | POA: Diagnosis not present

## 2016-06-09 DIAGNOSIS — Z8249 Family history of ischemic heart disease and other diseases of the circulatory system: Secondary | ICD-10-CM

## 2016-06-09 DIAGNOSIS — E785 Hyperlipidemia, unspecified: Secondary | ICD-10-CM | POA: Diagnosis present

## 2016-06-09 DIAGNOSIS — W010XXA Fall on same level from slipping, tripping and stumbling without subsequent striking against object, initial encounter: Secondary | ICD-10-CM | POA: Diagnosis present

## 2016-06-09 DIAGNOSIS — D6959 Other secondary thrombocytopenia: Secondary | ICD-10-CM | POA: Diagnosis not present

## 2016-06-09 DIAGNOSIS — F5105 Insomnia due to other mental disorder: Secondary | ICD-10-CM | POA: Diagnosis present

## 2016-06-09 DIAGNOSIS — I5082 Biventricular heart failure: Secondary | ICD-10-CM | POA: Diagnosis present

## 2016-06-09 DIAGNOSIS — J440 Chronic obstructive pulmonary disease with acute lower respiratory infection: Secondary | ICD-10-CM | POA: Diagnosis not present

## 2016-06-09 DIAGNOSIS — Z781 Physical restraint status: Secondary | ICD-10-CM

## 2016-06-09 DIAGNOSIS — I5023 Acute on chronic systolic (congestive) heart failure: Secondary | ICD-10-CM | POA: Diagnosis present

## 2016-06-09 DIAGNOSIS — I330 Acute and subacute infective endocarditis: Secondary | ICD-10-CM | POA: Diagnosis not present

## 2016-06-09 DIAGNOSIS — Z01818 Encounter for other preprocedural examination: Secondary | ICD-10-CM

## 2016-06-09 DIAGNOSIS — J95811 Postprocedural pneumothorax: Secondary | ICD-10-CM | POA: Diagnosis not present

## 2016-06-09 DIAGNOSIS — E669 Obesity, unspecified: Secondary | ICD-10-CM | POA: Diagnosis present

## 2016-06-09 DIAGNOSIS — Z6826 Body mass index (BMI) 26.0-26.9, adult: Secondary | ICD-10-CM

## 2016-06-09 DIAGNOSIS — Y92009 Unspecified place in unspecified non-institutional (private) residence as the place of occurrence of the external cause: Secondary | ICD-10-CM

## 2016-06-09 DIAGNOSIS — R4182 Altered mental status, unspecified: Secondary | ICD-10-CM

## 2016-06-09 DIAGNOSIS — J9690 Respiratory failure, unspecified, unspecified whether with hypoxia or hypercapnia: Secondary | ICD-10-CM

## 2016-06-09 DIAGNOSIS — R7989 Other specified abnormal findings of blood chemistry: Secondary | ICD-10-CM

## 2016-06-09 DIAGNOSIS — Z515 Encounter for palliative care: Secondary | ICD-10-CM | POA: Diagnosis not present

## 2016-06-09 DIAGNOSIS — I4821 Permanent atrial fibrillation: Secondary | ICD-10-CM | POA: Diagnosis present

## 2016-06-09 DIAGNOSIS — T827XXA Infection and inflammatory reaction due to other cardiac and vascular devices, implants and grafts, initial encounter: Secondary | ICD-10-CM | POA: Diagnosis not present

## 2016-06-09 DIAGNOSIS — I5043 Acute on chronic combined systolic (congestive) and diastolic (congestive) heart failure: Secondary | ICD-10-CM | POA: Diagnosis not present

## 2016-06-09 DIAGNOSIS — Z9689 Presence of other specified functional implants: Secondary | ICD-10-CM

## 2016-06-09 DIAGNOSIS — G9340 Encephalopathy, unspecified: Secondary | ICD-10-CM

## 2016-06-09 DIAGNOSIS — R0602 Shortness of breath: Secondary | ICD-10-CM

## 2016-06-09 DIAGNOSIS — K7460 Unspecified cirrhosis of liver: Secondary | ICD-10-CM | POA: Diagnosis present

## 2016-06-09 DIAGNOSIS — R569 Unspecified convulsions: Secondary | ICD-10-CM | POA: Diagnosis present

## 2016-06-09 DIAGNOSIS — J939 Pneumothorax, unspecified: Secondary | ICD-10-CM

## 2016-06-09 DIAGNOSIS — I4891 Unspecified atrial fibrillation: Secondary | ICD-10-CM | POA: Diagnosis not present

## 2016-06-09 DIAGNOSIS — Z885 Allergy status to narcotic agent status: Secondary | ICD-10-CM | POA: Diagnosis not present

## 2016-06-09 DIAGNOSIS — Z955 Presence of coronary angioplasty implant and graft: Secondary | ICD-10-CM

## 2016-06-09 DIAGNOSIS — D696 Thrombocytopenia, unspecified: Secondary | ICD-10-CM | POA: Diagnosis not present

## 2016-06-09 DIAGNOSIS — D684 Acquired coagulation factor deficiency: Secondary | ICD-10-CM | POA: Diagnosis not present

## 2016-06-09 DIAGNOSIS — J9601 Acute respiratory failure with hypoxia: Secondary | ICD-10-CM | POA: Diagnosis not present

## 2016-06-09 DIAGNOSIS — I252 Old myocardial infarction: Secondary | ICD-10-CM

## 2016-06-09 DIAGNOSIS — E875 Hyperkalemia: Secondary | ICD-10-CM | POA: Diagnosis present

## 2016-06-09 DIAGNOSIS — R6520 Severe sepsis without septic shock: Secondary | ICD-10-CM | POA: Diagnosis not present

## 2016-06-09 DIAGNOSIS — J930 Spontaneous tension pneumothorax: Secondary | ICD-10-CM | POA: Diagnosis not present

## 2016-06-09 DIAGNOSIS — R945 Abnormal results of liver function studies: Secondary | ICD-10-CM

## 2016-06-09 DIAGNOSIS — I5021 Acute systolic (congestive) heart failure: Secondary | ICD-10-CM | POA: Diagnosis not present

## 2016-06-09 DIAGNOSIS — Z7982 Long term (current) use of aspirin: Secondary | ICD-10-CM

## 2016-06-09 DIAGNOSIS — R45851 Suicidal ideations: Secondary | ICD-10-CM | POA: Diagnosis present

## 2016-06-09 DIAGNOSIS — IMO0002 Reserved for concepts with insufficient information to code with codable children: Secondary | ICD-10-CM | POA: Diagnosis present

## 2016-06-09 DIAGNOSIS — Y95 Nosocomial condition: Secondary | ICD-10-CM | POA: Diagnosis not present

## 2016-06-09 DIAGNOSIS — A419 Sepsis, unspecified organism: Secondary | ICD-10-CM | POA: Diagnosis not present

## 2016-06-09 DIAGNOSIS — I130 Hypertensive heart and chronic kidney disease with heart failure and stage 1 through stage 4 chronic kidney disease, or unspecified chronic kidney disease: Secondary | ICD-10-CM | POA: Diagnosis present

## 2016-06-09 DIAGNOSIS — Z888 Allergy status to other drugs, medicaments and biological substances status: Secondary | ICD-10-CM

## 2016-06-09 DIAGNOSIS — Z9049 Acquired absence of other specified parts of digestive tract: Secondary | ICD-10-CM

## 2016-06-09 DIAGNOSIS — Z87442 Personal history of urinary calculi: Secondary | ICD-10-CM

## 2016-06-09 DIAGNOSIS — G9341 Metabolic encephalopathy: Secondary | ICD-10-CM | POA: Diagnosis not present

## 2016-06-09 DIAGNOSIS — E1122 Type 2 diabetes mellitus with diabetic chronic kidney disease: Secondary | ICD-10-CM | POA: Diagnosis present

## 2016-06-09 DIAGNOSIS — G4733 Obstructive sleep apnea (adult) (pediatric): Secondary | ICD-10-CM | POA: Diagnosis present

## 2016-06-09 DIAGNOSIS — Z452 Encounter for adjustment and management of vascular access device: Secondary | ICD-10-CM

## 2016-06-09 DIAGNOSIS — E782 Mixed hyperlipidemia: Secondary | ICD-10-CM | POA: Diagnosis not present

## 2016-06-09 DIAGNOSIS — R34 Anuria and oliguria: Secondary | ICD-10-CM | POA: Diagnosis present

## 2016-06-09 DIAGNOSIS — M898X9 Other specified disorders of bone, unspecified site: Secondary | ICD-10-CM | POA: Diagnosis present

## 2016-06-09 DIAGNOSIS — I2510 Atherosclerotic heart disease of native coronary artery without angina pectoris: Secondary | ICD-10-CM | POA: Diagnosis present

## 2016-06-09 DIAGNOSIS — R197 Diarrhea, unspecified: Secondary | ICD-10-CM | POA: Diagnosis not present

## 2016-06-09 DIAGNOSIS — I509 Heart failure, unspecified: Secondary | ICD-10-CM

## 2016-06-09 DIAGNOSIS — Z884 Allergy status to anesthetic agent status: Secondary | ICD-10-CM

## 2016-06-09 DIAGNOSIS — R296 Repeated falls: Secondary | ICD-10-CM | POA: Diagnosis present

## 2016-06-09 DIAGNOSIS — E871 Hypo-osmolality and hyponatremia: Secondary | ICD-10-CM | POA: Diagnosis present

## 2016-06-09 DIAGNOSIS — Z95828 Presence of other vascular implants and grafts: Secondary | ICD-10-CM | POA: Diagnosis not present

## 2016-06-09 DIAGNOSIS — Z79899 Other long term (current) drug therapy: Secondary | ICD-10-CM | POA: Diagnosis not present

## 2016-06-09 DIAGNOSIS — Z7984 Long term (current) use of oral hypoglycemic drugs: Secondary | ICD-10-CM

## 2016-06-09 DIAGNOSIS — S40812A Abrasion of left upper arm, initial encounter: Secondary | ICD-10-CM | POA: Diagnosis not present

## 2016-06-09 DIAGNOSIS — A4102 Sepsis due to Methicillin resistant Staphylococcus aureus: Secondary | ICD-10-CM | POA: Diagnosis not present

## 2016-06-09 DIAGNOSIS — Z9181 History of falling: Secondary | ICD-10-CM

## 2016-06-09 LAB — TROPONIN I: Troponin I: 0.06 ng/mL (ref <0.03)

## 2016-06-09 LAB — COMPREHENSIVE METABOLIC PANEL
ALBUMIN: 4 g/dL (ref 3.5–5.0)
ALT: 16 U/L — ABNORMAL LOW (ref 17–63)
ANION GAP: 11 (ref 5–15)
AST: 25 U/L (ref 15–41)
Alkaline Phosphatase: 69 U/L (ref 38–126)
BILIRUBIN TOTAL: 1.8 mg/dL — AB (ref 0.3–1.2)
BUN: 28 mg/dL — ABNORMAL HIGH (ref 6–20)
CHLORIDE: 96 mmol/L — AB (ref 101–111)
CO2: 22 mmol/L (ref 22–32)
Calcium: 9.8 mg/dL (ref 8.9–10.3)
Creatinine, Ser: 1.59 mg/dL — ABNORMAL HIGH (ref 0.61–1.24)
GFR calc Af Amer: 46 mL/min — ABNORMAL LOW (ref >60)
GFR calc non Af Amer: 40 mL/min — ABNORMAL LOW (ref >60)
GLUCOSE: 158 mg/dL — AB (ref 65–99)
POTASSIUM: 4.4 mmol/L (ref 3.5–5.1)
SODIUM: 129 mmol/L — AB (ref 135–145)
TOTAL PROTEIN: 6.5 g/dL (ref 6.5–8.1)

## 2016-06-09 LAB — SALICYLATE LEVEL

## 2016-06-09 LAB — ETHANOL: Alcohol, Ethyl (B): <5 mg/dL (ref <5)

## 2016-06-09 LAB — CBC WITH DIFFERENTIAL/PLATELET
BASOS PCT: 0 %
Basophils Absolute: 0 10*3/uL (ref 0.0–0.1)
Eosinophils Absolute: 0 10*3/uL (ref 0.0–0.7)
Eosinophils Relative: 0 %
HCT: 46.5 % (ref 39.0–52.0)
Hemoglobin: 15.8 g/dL (ref 13.0–17.0)
LYMPHS PCT: 21 %
Lymphs Abs: 2.1 10*3/uL (ref 0.7–4.0)
MCH: 34.1 pg — ABNORMAL HIGH (ref 26.0–34.0)
MCHC: 34 g/dL (ref 30.0–36.0)
MCV: 100.2 fL — ABNORMAL HIGH (ref 78.0–100.0)
MONOS PCT: 10 %
Monocytes Absolute: 1 10*3/uL (ref 0.1–1.0)
NEUTROS ABS: 7.2 10*3/uL (ref 1.7–7.7)
NEUTROS PCT: 69 %
Platelets: 152 10*3/uL (ref 150–400)
RBC: 4.64 MIL/uL (ref 4.22–5.81)
RDW: 15.3 % (ref 11.5–15.5)
WBC: 10.4 10*3/uL (ref 4.0–10.5)

## 2016-06-09 LAB — DIGOXIN LEVEL: DIGOXIN LVL: 1.8 ng/mL (ref 0.8–2.0)

## 2016-06-09 LAB — ACETAMINOPHEN LEVEL

## 2016-06-09 LAB — LIPASE, BLOOD: Lipase: 13 U/L (ref 11–51)

## 2016-06-09 LAB — BRAIN NATRIURETIC PEPTIDE: B NATRIURETIC PEPTIDE 5: 1879 pg/mL — AB (ref 0.0–100.0)

## 2016-06-09 NOTE — ED Notes (Signed)
Sitter present, family wanded by [employer]

## 2016-06-09 NOTE — ED Notes (Signed)
Report to Lori, RN

## 2016-06-09 NOTE — ED Notes (Signed)
Pt belongings collected with the exception of his medication which is put into hisdaughter's hand and she will take home

## 2016-06-09 NOTE — ED Notes (Signed)
Pt to CT via stretcher

## 2016-06-09 NOTE — ED Notes (Signed)
Pt asked regarding his remarks to CT tech. He reports "I think about it every day" and became angry stating that he was kidding- pt and family informed that hospital personnel take such statements very seriously. Pt became more angry and states other things he said to the CT tech and asks"does that sound serious too?"

## 2016-06-09 NOTE — ED Notes (Signed)
Daughter is upset that noone has reviewd his medications- Pharm called to review meds and pt family reassurred

## 2016-06-09 NOTE — ED Notes (Signed)
Dr  Le in to assess 

## 2016-06-09 NOTE — ED Notes (Signed)
Pt actual weight 151.7 lb

## 2016-06-09 NOTE — ED Notes (Signed)
Daughter in conversation with Dr Mike Gip

## 2016-06-09 NOTE — ED Triage Notes (Addendum)
Pt reports losing his balance on Friday and fell over and hit his head on the wall. Pt c/o headache on the and dizziness. Pt reports sob x 2-3 months and bilateral lower leg pitting edema.

## 2016-06-09 NOTE — ED Notes (Signed)
Pt reports bending over Friday to pick up a pill and fell unpon his foreh

## 2016-06-09 NOTE — ED Provider Notes (Signed)
[facility] DEPT Provider Note   CSN: [id number] Arrival date & time: 06/27/2016  2044     History   Chief Complaint Chief Complaint  Patient presents with  . Head Injury    fall Friday  . Leg Swelling    HPI [name] is a 82 y.o. male.   Head Injury      Pt was seen at 2100. Per pt, c/o sudden onset and resolution of one episode of fall that occurred 2 days ago. Pt states he lost his balance and fell, hitting his head on a wall. Pt states since then, he has felt "dizzy" and had a headache. Pt states he has had N/V since yesterday. Pt also c/o gradual onset and persistence of constant "legs swelling" for the past 2 to 3 months. Pt states he has been taking a total of lasix 100mg  PO daily in divided doses and subsequently "peeing a lot." Pt states he has been filling up several urinals per day, every day. Pt states his "legs are still swollen." Endorses chronic SOB. Denies cough, no palpitations, no abd pain, no diarrhea, no black or blood in stools or emesis, no back pain, no syncope, no focal motor weakness, no tingling/numbness in extremities.    Past Medical History:  Diagnosis Date  . Arteriosclerotic cardiovascular disease (ASCVD)   . At risk for sudden cardiac death 09-05-12  . Atrial fibrillation (HCC)    Onset in 2012  . Atrial flutter (HCC)   . Bleeding in brain due to brain aneurysm [facility]) October 06 2014  . CAD (coronary artery disease)    a. s/p DES to LAD in 1999 b. DES to RCA in 2004 c. NST in 2014 showing scar along LAD territory and borderline ischemia along RCA  . CHF (congestive heart failure) (HCC)   . Chronic anticoagulation 2012   2012  . COPD (chronic obstructive pulmonary disease) (HCC)   . DJD (degenerative joint disease)   . Gout   . Hyperlipidemia   . ICD (implantable cardiac defibrillator) in place   . Ischemic cardiomyopathy   . Kidney stone    "just once" (08/17/2012)  . Myocardial infarction 1998  . NICM (nonischemic cardiomyopathy), EF  25-30% Sep 05, 2012  . Obstructive sleep apnea    "went away when I lost a bunch of weight" (08/17/2012)  . Permanent atrial fibrillation (HCC) 02/15/2011   Initial onset in 03/2011 with rapid ventricular response   . S/P ICD (internal cardiac defibrillator) procedure, 08/17/12, [employer] 2012/09/05   [employer]  . Type II diabetes mellitus [facility])     Patient Active Problem List   Diagnosis Date Noted  . Seizures (HCC) 04/03/2016  . Acute on chronic combined systolic and diastolic CHF (congestive heart failure) (HCC) 02/17/2015  . Chest pain 10/21/2014  . Transient alteration of awareness   . Systolic CHF, chronic (HCC)   . Subarachnoid hemorrhage (HCC)   . Cerebral thrombosis with cerebral infarction (HCC) 10/17/2014  . Seizure-like activity (HCC) 10/16/2014  . Syncope   . Subdural hematoma (HCC)   . Syncope and collapse   . Chronic systolic heart failure (HCC)   . Elevated troponin   . Other emphysema (HCC)   . Diabetes type 2, uncontrolled (HCC)   . HLD (hyperlipidemia)   . ACS (acute coronary syndrome) (HCC) 04/05/2014  . Sepsis (HCC) 04/05/2014  . Coronary artery disease 01/05/2014  . Cardiomyopathy, ischemic 03/26/2013  . Obstructive sleep apnea 03/26/2013  . Chronic combined systolic and diastolic CHF, NYHA  class 2 (HCC) 09/24/2012  . At risk for sudden cardiac death 09-12-2012  . S/P ICD (internal cardiac defibrillator) procedure, 08/17/12, [employer] implanted Sep 12, 2012  . Fall 02/28/2012  . Traumatic subarachnoid hemorrhage (HCC) 02/28/2012  . Multiple fractures of ribs of left side 02/28/2012  . Hyperkalemia 06/25/2011  . Ureterolithiasis 06/25/2011  . Laboratory test 04/29/2011  . COPD (chronic obstructive pulmonary disease) (HCC)   . Chronic anticoagulation, coumadin   . Permanent atrial fibrillation (HCC) 02/15/2011  . DIABETES MELLITUS, TYPE II 10/07/2008  . OBSTRUCTIVE SLEEP APNEA 10/07/2008  . Arteriosclerotic cardiovascular disease  (ASCVD) 10/07/2008    Past Surgical History:  Procedure Laterality Date  . CARDIAC CATHETERIZATION  02/2003  . CARDIAC DEFIBRILLATOR PLACEMENT  08/17/2012   [employer]  . CATARACT EXTRACTION W/ INTRAOCULAR LENS  IMPLANT, BILATERAL Bilateral ~ 2011  . CHOLECYSTECTOMY  2009  . CORONARY ANGIOPLASTY WITH STENT PLACEMENT  07/14/1996   "1" (08/17/2012)  . CYSTOSCOPY/RETROGRADE/URETEROSCOPY  06/28/2011   Procedure: CYSTOSCOPY/RETROGRADE/URETEROSCOPY;  Surgeon: Ky Barban, MD;  Location: [facility] ORS;  Service: Urology;  Laterality: Right;  . IMPLANTABLE CARDIOVERTER DEFIBRILLATOR IMPLANT N/A 08/17/2012   Procedure: IMPLANTABLE CARDIOVERTER DEFIBRILLATOR IMPLANT;  Surgeon: Thurmon Fair, MD;  Location: [facility] CATH LAB;  Service: Cardiovascular;  Laterality: N/A;  . KNEE ARTHROPLASTY Left 1978   "tendon & cartilege repair" (08/17/2012)  . Myoview perfusion scan  06/02/2012   low risk, extensive scar entire LAD & RCA territory  . s/p icd  08/2012   [employer]  . STONE EXTRACTION WITH BASKET  06/28/2011   Procedure: STONE EXTRACTION WITH BASKET;  Surgeon: Ky Barban, MD;  Location: [facility] ORS;  Service: Urology;  Laterality: Right;  specimen given to family per MD  . US ECHOCARDIOGRAPHY  07/08/2012   EF <20%,mild MR,TR,LA severely dilated  . VASECTOMY  ~ 1964       Home Medications    Prior to Admission medications   Medication Sig Start Date End Date Taking? Authorizing Provider  aspirin EC 81 MG tablet Take 81 mg by mouth daily.    Historical Provider, MD  Cholecalciferol (VITAMIN D3) 5000 units CAPS Take 1 capsule by mouth daily.    Historical Provider, MD  digoxin (LANOXIN) 0.25 MG tablet Take 1/2 tablet daily 05/23/16   Lennette Bihari, MD  furosemide (LASIX) 40 MG tablet Take 1.5 tablets in the morning and 1/2 tablet @ 4 05/23/16   Lennette Bihari, MD  isosorbide mononitrate (IMDUR) 30 MG 24 hr tablet Take 0.5 tablets (15 mg total) by mouth at bedtime. 05/23/16   Lennette Bihari, MD    lamoTRIgine (LAMICTAL) 25 MG tablet 1 tablet twice a day for 7 days, then 2 tablets twice a day for 7 days, then 3 tablets twice a day for 7 days, then Start new prescription for lamotrigine XR 200mg , one tablet at bedtime. 05/29/16   Butch Penny, NP  LamoTRIgine XR 200 MG TB24 Take one tablet at bedtime. 05/29/16   Butch Penny, NP  magnesium oxide (MAG-OX) 400 MG tablet Take 1 tablet (400 mg total) by mouth 2 (two) times daily. 01/15/16   Mihai Croitoru, MD  metFORMIN (GLUCOPHAGE) 500 MG tablet Take 500 mg by mouth 2 (two) times daily with a meal.     Historical Provider, MD  potassium chloride (K-DUR) 10 MEQ tablet Take 1 tablet (10 mEq total) by mouth daily. 01/15/16   Mihai Croitoru, MD  pravastatin (PRAVACHOL) 40 MG tablet Take 1 tablet (40 mg total) by mouth daily.  01/15/16   Thurmon Fair, MD    Family History Family History  Problem Relation Age of Onset  . Heart failure Mother   . Heart failure Father     Social History Social History  Substance Use Topics  . Smoking status: Former Smoker    Packs/day: 2.00    Years: 40.00    Types: Cigarettes    Quit date: 04/08/1992  . Smokeless tobacco: Former Neurosurgeon  . Alcohol use No     Comment: 08/17/2012 "quit drinking in 1983"     Allergies   Novocain [procaine]; Lipitor [atorvastatin]; Procaine hcl; and Tramadol   Review of Systems Review of Systems ROS: Statement: All systems negative except as marked or noted in the HPI; Constitutional: Negative for fever and chills. ; ; Eyes: Negative for eye pain, redness and discharge. ; ; ENMT: Negative for ear pain, hoarseness, nasal congestion, sinus pressure and sore throat. ; ; Cardiovascular: Negative for chest pain, palpitations, diaphoresis, +dyspnea and peripheral edema. ; ; Respiratory: Negative for cough, wheezing and stridor. ; ; Gastrointestinal: Negative for nausea, vomiting, diarrhea, abdominal pain, blood in stool, hematemesis, jaundice and rectal bleeding. . ; ;  Genitourinary: Negative for dysuria, flank pain and hematuria. ; ; Musculoskeletal: +head injury. Negative for back pain and neck pain. Negative for swelling and deformity.; ; Skin: Negative for pruritus, rash, abrasions, blisters, bruising and skin lesion.; ; Neuro: +"dizziness," headache. Negative for lightheadedness and neck stiffness. Negative for weakness, altered level of consciousness, altered mental status, extremity weakness, paresthesias, involuntary movement, seizure and syncope.     Physical Exam Updated Vital Signs BP 109/93 (BP Location: Right Arm)   Pulse 103   Temp 97.6 F (36.4 C)   Resp 20   Ht 5\' 4"  (1.626 m)   Wt 154 lb (69.9 kg)   SpO2 99%   BMI 26.43 kg/m   Physical Exam 2105: Physical examination:  Nursing notes reviewed; Vital signs and O2 SAT reviewed;  Constitutional: Well developed, Well nourished, Well hydrated, In no acute distress; Head:  Normocephalic, atraumatic; Eyes: EOMI, PERRL, No scleral icterus; ENMT: Mouth and pharynx normal, Mucous membranes moist; Neck: Supple, Full range of motion, No lymphadenopathy; Cardiovascular: Irregular rate and rhythm, No gallop; Respiratory: Breath sounds clear & equal bilaterally, No wheezes.  Speaking full sentences with ease, Normal respiratory effort/excursion; Chest: Nontender, Movement normal; Abdomen: Soft, Nontender, Nondistended, Normal bowel sounds; Genitourinary: No CVA tenderness; Extremities: Pulses normal, No tenderness, +1 pedal edema bilat. No calf asymmetry.; Neuro: AA&Ox3, Major CN grossly intact. No facial droop. Speech clear. No gross focal motor or sensory deficits in extremities.; Skin: Color normal, Warm, Dry.; Psych:  Affect flat, poor eye contact.    ED Treatments / Results  Labs (all labs ordered are listed, but only abnormal results are displayed)   EKG  EKG Interpretation  Date/Time:  Sunday 13-Jun-2016 20:52:48 EST Ventricular Rate:  96 PR Interval:    QRS Duration: 155 QT  Interval:  349 QTC Calculation: 441 R Axis:   155 Text Interpretation:  Atrial fibrillation Consider left ventricular hypertrophy Anterior Q waves, possibly due to LVH Non-specific intra-ventricular conduction delay When compared with ECG of 04/30/2016 No significant change was found Confirmed by [facility]  MD, Nicholos Johns [phone]) on 13-Jun-2016 9:06:40 PM       Radiology   Procedures Procedures (including critical care time)  Medications Ordered in ED Medications - No data to display   Initial Impression / Assessment and Plan / ED Course  I have  reviewed the triage vital signs and the nursing notes.  Pertinent labs & imaging results that were available during my care of the patient were reviewed by me and considered in my medical decision making (see chart for details).  MDM Reviewed: previous chart, nursing note and vitals Reviewed previous: labs and ECG Interpretation: labs, ECG, x-ray and CT scan Total time providing critical care: 30-74 minutes. This excludes time spent performing separately reportable procedures and services. Consults: admitting MD    CRITICAL CARE Performed by: Laray Anger Total critical care time: 35 minutes Critical care time was exclusive of separately billable procedures and treating other patients. Critical care was necessary to treat or prevent imminent or life-threatening deterioration. Critical care was time spent personally by me on the following activities: development of treatment plan with patient and/or surrogate as well as nursing, discussions with consultants, evaluation of patient's response to treatment, examination of patient, obtaining history from patient or surrogate, ordering and performing treatments and interventions, ordering and review of laboratory studies, ordering and review of radiographic studies, pulse oximetry and re-evaluation of patient's condition.   Results for orders placed or performed during the hospital encounter  of 2016-07-04  Comprehensive metabolic panel  Result Value Ref Range   Sodium 129 (L) 135 - 145 mmol/L   Potassium 4.4 3.5 - 5.1 mmol/L   Chloride 96 (L) 101 - 111 mmol/L   CO2 22 22 - 32 mmol/L   Glucose, Bld 158 (H) 65 - 99 mg/dL   BUN 28 (H) 6 - 20 mg/dL   Creatinine, Ser 1.61 (H) 0.61 - 1.24 mg/dL   Calcium 9.8 8.9 - 09.6 mg/dL   Total Protein 6.5 6.5 - 8.1 g/dL   Albumin 4.0 3.5 - 5.0 g/dL   AST 25 15 - 41 U/L   ALT 16 (L) 17 - 63 U/L   Alkaline Phosphatase 69 38 - 126 U/L   Total Bilirubin 1.8 (H) 0.3 - 1.2 mg/dL   GFR calc non Af Amer 40 (L) >60 mL/min   GFR calc Af Amer 46 (L) >60 mL/min   Anion gap 11 5 - 15  Lipase, blood  Result Value Ref Range   Lipase 13 11 - 51 U/L  CBC with Differential  Result Value Ref Range   WBC 10.4 4.0 - 10.5 K/uL   RBC 4.64 4.22 - 5.81 MIL/uL   Hemoglobin 15.8 13.0 - 17.0 g/dL   HCT 04.5 40.9 - 81.1 %   MCV 100.2 (H) 78.0 - 100.0 fL   MCH 34.1 (H) 26.0 - 34.0 pg   MCHC 34.0 30.0 - 36.0 g/dL   RDW 91.4 78.2 - 95.6 %   Platelets 152 150 - 400 K/uL   Neutrophils Relative % 69 %   Neutro Abs 7.2 1.7 - 7.7 K/uL   Lymphocytes Relative 21 %   Lymphs Abs 2.1 0.7 - 4.0 K/uL   Monocytes Relative 10 %   Monocytes Absolute 1.0 0.1 - 1.0 K/uL   Eosinophils Relative 0 %   Eosinophils Absolute 0.0 0.0 - 0.7 K/uL   Basophils Relative 0 %   Basophils Absolute 0.0 0.0 - 0.1 K/uL  Troponin I  Result Value Ref Range   Troponin I 0.06 (HH) <0.03 ng/mL  Brain natriuretic peptide  Result Value Ref Range   B Natriuretic Peptide 1,879.0 (H) 0.0 - 100.0 pg/mL  Digoxin level  Result Value Ref Range   Digoxin Level 1.8 0.8 - 2.0 ng/mL  Ethanol  Result Value Ref Range  Alcohol, Ethyl (B) <5 <5 mg/dL  Acetaminophen level  Result Value Ref Range   Acetaminophen (Tylenol), Serum <10 (L) 10 - 30 ug/mL  Salicylate level  Result Value Ref Range   Salicylate Lvl <7.0 2.8 - 30.0 mg/dL   Dg Chest 2 View Result Date: 06/28/16 CLINICAL DATA:  Lost  balance and fell 2 days ago, striking his head on wall. Bilateral lower extremity pitting edema. Dyspnea for several months. EXAM: CHEST  2 VIEW COMPARISON:  04/30/2016 FINDINGS: Stable cardiomegaly. The lungs are clear. Possible trace right pleural effusion. Pulmonary vasculature is normal. The transvenous cardiac lead appears grossly intact. IMPRESSION: Stable cardiomegaly. Possible trace right pleural effusion. No interstitial or alveolar edema. Electronically Signed   By: Ellery Plunk M.D.   On: 28-Jun-2016 21:55   Ct Head Wo Contrast Result Date: 2016/06/28 CLINICAL DATA:  Fall with head injury.  Initial encounter. EXAM: CT HEAD WITHOUT CONTRAST CT CERVICAL SPINE WITHOUT CONTRAST TECHNIQUE: Multidetector CT imaging of the head and cervical spine was performed following the standard protocol without intravenous contrast. Multiplanar CT image reconstructions of the cervical spine were also generated. COMPARISON:  07/12/2015 FINDINGS: CT HEAD FINDINGS Brain: No evidence of acute infarction, hemorrhage, hydrocephalus, or mass lesion. Variable extra-axial space thickness around the frontal lobes is stable from 2017. No definite extra-axial collection. Generalized atrophy, stable. Vascular: Atherosclerotic calcification. Skull: Negative for fracture. Sinuses/Orbits: Bilateral cataract resection.  No acute finding CT CERVICAL SPINE FINDINGS Alignment: Normal. Skull base and vertebrae: No acute fracture. No primary bone lesion or focal pathologic process. Soft tissues and spinal canal: No prevertebral fluid or swelling. No visible canal hematoma. Disc levels: Mild generalized disc narrowing and annulus calcification. No identified impingement. Upper chest: No acute finding IMPRESSION: No evidence of intracranial or cervical spine injury. Electronically Signed   By: Marnee Spring M.D.   On: 06-28-2016 21:53   Ct Cervical Spine Wo Contrast Result Date: 2016-06-28 CLINICAL DATA:  Fall with head injury.  Initial  encounter. EXAM: CT HEAD WITHOUT CONTRAST CT CERVICAL SPINE WITHOUT CONTRAST TECHNIQUE: Multidetector CT imaging of the head and cervical spine was performed following the standard protocol without intravenous contrast. Multiplanar CT image reconstructions of the cervical spine were also generated. COMPARISON:  07/12/2015 FINDINGS: CT HEAD FINDINGS Brain: No evidence of acute infarction, hemorrhage, hydrocephalus, or mass lesion. Variable extra-axial space thickness around the frontal lobes is stable from 2017. No definite extra-axial collection. Generalized atrophy, stable. Vascular: Atherosclerotic calcification. Skull: Negative for fracture. Sinuses/Orbits: Bilateral cataract resection.  No acute finding CT CERVICAL SPINE FINDINGS Alignment: Normal. Skull base and vertebrae: No acute fracture. No primary bone lesion or focal pathologic process. Soft tissues and spinal canal: No prevertebral fluid or swelling. No visible canal hematoma. Disc levels: Mild generalized disc narrowing and annulus calcification. No identified impingement. Upper chest: No acute finding IMPRESSION: No evidence of intracranial or cervical spine injury. Electronically Signed   By: Marnee Spring M.D.   On: 06-28-16 21:53    Results for EDGER, HUSAIN (MRN [id number]) as of 06-28-16 22:27  Ref. Range 12/22/2015 23:55 12/23/2015 02:45 04/23/2016 22:29 04/24/2016 01:11 06-28-2016 21:11  Troponin I Latest Ref Range: <0.03 ng/mL 0.06 (HH) 0.06 (HH) 0.06 (HH) 0.05 (HH) 0.06 [facility])    Results for URBAN, NAVAL (MRN [id number]) as of 06-28-2016 22:27  Ref. Range 11/13/2014 09:26 12/22/2015 23:55 04/23/2016 22:29 04/30/2016 16:59 06/28/2016 21:11  B Natriuretic Peptide Latest Ref Range: 0.0 - 100.0 pg/mL 162.0 (H) 1,317.0 (H) 1,051.0 (H) 951.5 (H) 1,879.0 (  H)   Results for CAELAN, MORIARITY (MRN [id number]) as of 06/25/2016 22:27  Ref. Range 05/02/2016 04:27 05/30/2016 13:36 07/05/2016 21:11  BUN Latest Ref Range: 6 - 20 mg/dL 18 14 28  (H)  Creatinine Latest  Ref Range: 0.61 - 1.24 mg/dL 3.15 (H) 1.76 (H) 1.60 (H)    2125:  Pt's daughter states he weighs 147# at home (down from 150-152#), since taking the increased dose of lasix. Pt's weight in ED is 151.7#.  Pt went over for CT scans and asked CT Tech if he "knew of any way to put him out." Upon further questioning, pt endorses he has been "thinking of blowing his brains out for a while" and "thinks about it every day." Pt will need psych eval after medical workup completed.   2235:  BUN/Cr and Troponin elevated per baseline. New hyponatremia on labs and BNP elevated, no overt CHF on CXR. Pt becoming more and more agitated, calling staff "sons of bitches" and "I'll beat them up." Also stating "they can't make me stay" and "I'll just leave." IVC paperwork completed. Pt will need medical admit and correction of hyponatremia before psychiatric evaluation. Dx and testing d/w pt and family.  Questions answered.  Verb understanding, agreeable to admit.  T/C to [facility] Dr. Conley Rolls, case discussed, including:  HPI, pertinent PM/SHx, VS/PE, dx testing, ED course and treatment:  Agreeable to admit, requests he will come to the ED for evaluation, hold IVF vs lasix his evaluation.        Final Clinical Impressions(s) / ED Diagnoses   Final diagnoses:  None    New Prescriptions New Prescriptions   No medications on file      Samuel Jester, DO 06/14/16 1251

## 2016-06-09 NOTE — ED Notes (Signed)
Call from [place] CT, reports that [name] asked him if he knew of any way to put him out- when further questioned, pt reports that he has been thinking of blowing his brains out for awhile

## 2016-06-09 NOTE — ED Notes (Signed)
Dr Mike Gip informed of report from CT [place]

## 2016-06-09 NOTE — ED Notes (Signed)
Pr daughter reports that father weighs 147lb and has lost weight

## 2016-06-09 NOTE — ED Notes (Signed)
After Dr Mike Gip spoke with opt, she states that he probably needs a sitter due to his self harm remarks-  CN Darl Pikes and Jorja Loa, [facility] informed

## 2016-06-09 NOTE — ED Notes (Signed)
Dr Marisue Ivan has filled out emergency involuntary committment paper work on pt

## 2016-06-09 NOTE — ED Notes (Signed)
Troponin level given to Dr cM who is at the bedside speaking with pt and reviewing his results with both him and family

## 2016-06-09 NOTE — H&P (Signed)
History and Physical    [name] ZOX:[id number] DOB: December 01, 1934 DOA: Jul 09, 2016  PCP: [name], MD  Patient coming from: Home.    Chief Complaint:  Falling, and suicidal ideation.   HPI: [name] is an 82 y.o. male with hx of chronic afib, not on anticoagulation due to hx of intracerebral bleed and fall risks, hx of known CAD, DM2, s/p ICD placement with [facility], fall and hit his head.  Work up included a head CT negative for bleed, Na of 129, Cr of 1.59, 10K, WBC of 10L and Hb of 15g.  He has had some nausea for a few days, and no diarrhea or abdominal pain.  In CT, he told the tech that he would like to kill himself, and later confirmed this.  He has no active plan at this time.  Therefore, he was IVC by Dr Clarene Duke of ED.  He has a cervical CT which showed no acute process.  Hospitalist was asked to admit him for hyponatremia, falls, and suicidal ideation.    ED Course:  See above.  Rewiew of Systems:  Constitutional: Negative for malaise, fever and chills. No significant weight loss or weight gain Eyes: Negative for eye pain, redness and discharge, diplopia, visual changes, or flashes of light. ENMT: Negative for ear pain, hoarseness, nasal congestion, sinus pressure and sore throat. No headaches; tinnitus, drooling, or problem swallowing. Cardiovascular: Negative for chest pain, palpitations, diaphoresis, dyspnea and peripheral edema. ; No orthopnea, PND Respiratory: Negative for cough, hemoptysis, wheezing and stridor. No pleuritic chestpain. Gastrointestinal: Negative for diarrhea, constipation,  melena, blood in stool, hematemesis, jaundice and rectal bleeding.    Genitourinary: Negative for frequency, dysuria, incontinence,flank pain and hematuria; Musculoskeletal: Negative for back pain and neck pain. Negative for swelling and trauma.;  Skin: . Negative for pruritus, rash, abrasions, bruising and skin lesion.; ulcerations Neuro: Negative for headache,  lightheadedness and neck stiffness. Negative for weakness, altered level of consciousness , altered mental status, extremity weakness, burning feet, involuntary movement, seizure and syncope.  Psych: negative for anxiety, depression, insomnia, tearfulness, panic attacks, hallucinations, paranoia,  homicidal ideation    Past Medical History:  Diagnosis Date  . Arteriosclerotic cardiovascular disease (ASCVD)   . At risk for sudden cardiac death 2012/09/17  . Atrial fibrillation (HCC)    Onset in 2012  . Atrial flutter (HCC)   . Bleeding in brain due to brain aneurysm [facility]) October 06 2014  . CAD (coronary artery disease)    a. s/p DES to LAD in 1999 b. DES to RCA in 2004 c. NST in 2014 showing scar along LAD territory and borderline ischemia along RCA  . CHF (congestive heart failure) (HCC)   . Chronic anticoagulation 2012   2012  . COPD (chronic obstructive pulmonary disease) (HCC)   . DJD (degenerative joint disease)   . Gout   . Hyperlipidemia   . ICD (implantable cardiac defibrillator) in place   . Ischemic cardiomyopathy   . Kidney stone    "just once" (08/17/2012)  . Myocardial infarction 1998  . NICM (nonischemic cardiomyopathy), EF 25-30% 2012-09-17  . Obstructive sleep apnea    "went away when I lost a bunch of weight" (08/17/2012)  . Permanent atrial fibrillation (HCC) 02/15/2011   Initial onset in 03/2011 with rapid ventricular response   . S/P ICD (internal cardiac defibrillator) procedure, 08/17/12, [employer] 2012/09/17   [employer]  . Type II diabetes mellitus (HCC)     Past Surgical History:  Procedure  Laterality Date  . CARDIAC CATHETERIZATION  02/2003  . CARDIAC DEFIBRILLATOR PLACEMENT  08/17/2012   [employer]  . CATARACT EXTRACTION W/ INTRAOCULAR LENS  IMPLANT, BILATERAL Bilateral ~ 2011  . CHOLECYSTECTOMY  2009  . CORONARY ANGIOPLASTY WITH STENT PLACEMENT  07/14/1996   "1" (08/17/2012)  . CYSTOSCOPY/RETROGRADE/URETEROSCOPY  06/28/2011   Procedure:  CYSTOSCOPY/RETROGRADE/URETEROSCOPY;  Surgeon: Ky Barban, MD;  Location: [facility];  Service: Urology;  Laterality: Right;  . IMPLANTABLE CARDIOVERTER DEFIBRILLATOR IMPLANT N/A 08/17/2012   Procedure: IMPLANTABLE CARDIOVERTER DEFIBRILLATOR IMPLANT;  Surgeon: Thurmon Fair, MD;  Location: [facility] CATH [facility];  Service: Cardiovascular;  Laterality: N/A;  . KNEE ARTHROPLASTY Left 1978   "tendon & cartilege repair" (08/17/2012)  . Myoview perfusion scan  06/02/2012   low risk, extensive scar entire LAD & RCA territory  . s/p icd  08/2012   [employer]  . STONE EXTRACTION WITH BASKET  06/28/2011   Procedure: STONE EXTRACTION WITH BASKET;  Surgeon: Ky Barban, MD;  Location: [facility];  Service: Urology;  Laterality: Right;  specimen given to family per MD  . US ECHOCARDIOGRAPHY  07/08/2012   EF <20%,mild MR,TR,LA severely dilated  . VASECTOMY  ~ 1964     reports that he quit smoking about 24 years ago. His smoking use included Cigarettes. He has a 80.00 pack-year smoking history. He has quit using smokeless tobacco. He reports that he does not drink alcohol or use drugs.  Allergies  Allergen Reactions  . Novocain [Procaine] Nausea And Vomiting and Other (See Comments)    Passes out (also)  . Lipitor [Atorvastatin] Other (See Comments)    Myalgias    . Procaine Hcl Nausea And Vomiting  . Tramadol Itching and Nausea And Vomiting    Family History  Problem Relation Age of Onset  . Heart failure Mother   . Heart failure Father      Prior to Admission medications   Medication Sig Start Date End Date Taking? Authorizing Provider  aspirin EC 81 MG tablet Take 81 mg by mouth daily.   Yes Historical Provider, MD  Cholecalciferol (VITAMIN D3) 5000 units CAPS Take 1 capsule by mouth daily.   Yes Historical Provider, MD  digoxin (LANOXIN) 0.25 MG tablet Take 1/2 tablet daily Patient taking differently: Take 0.125 mg by mouth daily. Take 1/2 tablet daily 05/23/16  Yes Lennette Bihari, MD    furosemide (LASIX) 40 MG tablet Take 1.5 tablets in the morning and 1/2 tablet @ 4 Patient taking differently: Take 20-60 mg by mouth 2 (two) times daily. Take 1.5 tablets in the morning and 1/2 tablet @ 4 05/23/16  Yes Lennette Bihari, MD  isosorbide mononitrate (IMDUR) 30 MG 24 hr tablet Take 0.5 tablets (15 mg total) by mouth at bedtime. 05/23/16  Yes Lennette Bihari, MD  LamoTRIgine XR 200 MG TB24 Take one tablet at bedtime. Patient taking differently: Take 200 mg by mouth at bedtime. Take one tablet at bedtime. 05/29/16  Yes Butch Penny, NP  magnesium oxide (MAG-OX) 400 MG tablet Take 1 tablet (400 mg total) by mouth 2 (two) times daily. 01/15/16  Yes Mihai Croitoru, MD  metFORMIN (GLUCOPHAGE) 500 MG tablet Take 500 mg by mouth 2 (two) times daily with a meal.    Yes Historical Provider, MD  potassium chloride (K-DUR) 10 MEQ tablet Take 1 tablet (10 mEq total) by mouth daily. 01/15/16  Yes Mihai Croitoru, MD  pravastatin (PRAVACHOL) 40 MG tablet Take 1 tablet (40 mg total) by mouth daily. 01/15/16  Yes Mihai Croitoru, MD  lamoTRIgine (LAMICTAL) 25 MG tablet 1 tablet twice a day for 7 days, then 2 tablets twice a day for 7 days, then 3 tablets twice a day for 7 days, then Start new prescription for lamotrigine XR 200mg , one tablet at bedtime. Patient not taking: Reported on 12-Jun-2016 05/29/16   Butch Penny, NP    Physical Exam: Vitals:   06/12/16 2145 06/12/2016 2153 06/12/16 2201 June 12, 2016 2233  BP:   108/70 115/77  Pulse: 89  90 71  Resp: 21  19 18   Temp:      SpO2: 94%  93% 97%  Weight:  68.8 kg (151 lb 11.2 oz)    Height:          Constitutional: NAD, calm, comfortable Vitals:   06-12-2016 2145 2016-06-12 2153 06/12/16 2201 06/12/16 2233  BP:   108/70 115/77  Pulse: 89  90 71  Resp: 21  19 18   Temp:      SpO2: 94%  93% 97%  Weight:  68.8 kg (151 lb 11.2 oz)    Height:       Eyes: PERRL, lids and conjunctivae normal ENMT: Mucous membranes are moist. Posterior pharynx clear of  any exudate or lesions.Normal dentition.  Neck: normal, supple, no masses, no thyromegaly Respiratory: clear to auscultation bilaterally, no wheezing, no crackles. Normal respiratory effort. No accessory muscle use.  Cardiovascular: Regular rate and rhythm, no murmurs / rubs / gallops. No extremity edema. 2+ pedal pulses. No carotid bruits.  Abdomen: no tenderness, no masses palpated. No hepatosplenomegaly. Bowel sounds positive.  Musculoskeletal: no clubbing / cyanosis. No joint deformity upper and lower extremities. Good ROM, no contractures. Normal muscle tone.  Skin: no rashes, lesions, ulcers. No induration Neurologic: CN 2-12 grossly intact. Sensation intact, DTR normal. Strength 5/5 in all 4.  Psychiatric: Normal judgment and insight. Alert and oriented x 3. Normal mood.    Labs on Admission: I have personally reviewed following labs and imaging studies CBC:  Recent Labs Lab 06-12-2016 2111  WBC 10.4  NEUTROABS 7.2  HGB 15.8  HCT 46.5  MCV 100.2*  PLT 152   Basic Metabolic Panel:  Recent Labs Lab 2016/06/12 2111  NA 129*  K 4.4  CL 96*  CO2 22  GLUCOSE 158*  BUN 28*  CREATININE 1.59*  CALCIUM 9.8   GFR: Estimated Creatinine Clearance: 31.5 mL/min (by C-G formula based on SCr of 1.59 mg/dL (H)). Liver Function Tests:  Recent Labs Lab 06-12-16 2111  AST 25  ALT 16*  ALKPHOS 69  BILITOT 1.8*  PROT 6.5  ALBUMIN 4.0    Recent Labs Lab 2016-06-12 2111  LIPASE 13   Cardiac Enzymes:  Recent Labs Lab 12-Jun-2016 2111  TROPONINI 0.06*   Urine analysis:    Component Value Date/Time   COLORURINE YELLOW 01/05/2016 1933   APPEARANCEUR CLEAR 01/05/2016 1933   LABSPEC 1.010 01/05/2016 1933   PHURINE 6.0 01/05/2016 1933   GLUCOSEU NEGATIVE 01/05/2016 1933   HGBUR SMALL (A) 01/05/2016 1933   BILIRUBINUR NEGATIVE 01/05/2016 1933   KETONESUR NEGATIVE 01/05/2016 1933   PROTEINUR NEGATIVE 01/05/2016 1933   UROBILINOGEN 0.2 11/13/2014 0933   NITRITE NEGATIVE  01/05/2016 1933   LEUKOCYTESUR NEGATIVE 01/05/2016 1933    Radiological Exams on Admission: Dg Chest 2 View  Result Date: 2016/06/12 CLINICAL DATA:  Lost balance and fell 2 days ago, striking his head on wall. Bilateral lower extremity pitting edema. Dyspnea for several months. EXAM: CHEST  2 VIEW COMPARISON:  04/30/2016  FINDINGS: Stable cardiomegaly. The lungs are clear. Possible trace right pleural effusion. Pulmonary vasculature is normal. The transvenous cardiac lead appears grossly intact. IMPRESSION: Stable cardiomegaly. Possible trace right pleural effusion. No interstitial or alveolar edema. Electronically Signed   By: Ellery Plunk M.D.   On: 06/12/2016 21:55   Ct Head Wo Contrast  Result Date: 06/08/2016 CLINICAL DATA:  Fall with head injury.  Initial encounter. EXAM: CT HEAD WITHOUT CONTRAST CT CERVICAL SPINE WITHOUT CONTRAST TECHNIQUE: Multidetector CT imaging of the head and cervical spine was performed following the standard protocol without intravenous contrast. Multiplanar CT image reconstructions of the cervical spine were also generated. COMPARISON:  07/12/2015 FINDINGS: CT HEAD FINDINGS Brain: No evidence of acute infarction, hemorrhage, hydrocephalus, or mass lesion. Variable extra-axial space thickness around the frontal lobes is stable from 2017. No definite extra-axial collection. Generalized atrophy, stable. Vascular: Atherosclerotic calcification. Skull: Negative for fracture. Sinuses/Orbits: Bilateral cataract resection.  No acute finding CT CERVICAL SPINE FINDINGS Alignment: Normal. Skull base and vertebrae: No acute fracture. No primary bone lesion or focal pathologic process. Soft tissues and spinal canal: No prevertebral fluid or swelling. No visible canal hematoma. Disc levels: Mild generalized disc narrowing and annulus calcification. No identified impingement. Upper chest: No acute finding IMPRESSION: No evidence of intracranial or cervical spine injury. Electronically  Signed   By: Marnee Spring M.D.   On: 06/20/2016 21:53   Ct Cervical Spine Wo Contrast  Result Date: 07/04/2016 CLINICAL DATA:  Fall with head injury.  Initial encounter. EXAM: CT HEAD WITHOUT CONTRAST CT CERVICAL SPINE WITHOUT CONTRAST TECHNIQUE: Multidetector CT imaging of the head and cervical spine was performed following the standard protocol without intravenous contrast. Multiplanar CT image reconstructions of the cervical spine were also generated. COMPARISON:  07/12/2015 FINDINGS: CT HEAD FINDINGS Brain: No evidence of acute infarction, hemorrhage, hydrocephalus, or mass lesion. Variable extra-axial space thickness around the frontal lobes is stable from 2017. No definite extra-axial collection. Generalized atrophy, stable. Vascular: Atherosclerotic calcification. Skull: Negative for fracture. Sinuses/Orbits: Bilateral cataract resection.  No acute finding CT CERVICAL SPINE FINDINGS Alignment: Normal. Skull base and vertebrae: No acute fracture. No primary bone lesion or focal pathologic process. Soft tissues and spinal canal: No prevertebral fluid or swelling. No visible canal hematoma. Disc levels: Mild generalized disc narrowing and annulus calcification. No identified impingement. Upper chest: No acute finding IMPRESSION: No evidence of intracranial or cervical spine injury. Electronically Signed   By: Marnee Spring M.D.   On: 06/26/2016 21:53    EKG: Independently reviewed.  Assessment/Plan Active Problems:   Permanent atrial fibrillation (HCC)   Fall   S/P ICD (internal cardiac defibrillator) procedure, 08/17/12, [employer] implanted   Diabetes type 2, uncontrolled (HCC)   HLD (hyperlipidemia)   Systolic CHF, chronic (HCC)   Hyponatremia   Suicidal ideation    PLAN:   Hypervolemic hyponatremia:  This patient has several reason for hypervolemic hyponatremia.  He has low EF (25%), along with having nausea for several days (powerful release of ADH), and he has been doubling  up on the Lamictal, which causes SIADH.  Will restrict his free water intake to 1200 cc per day, give Lasix 40mg  IV per day to get rid of free water, and follow Na daily.   Hypokalemia: diuretic induced.  Will continue to supplement.   Suicidal ideation:  He is IVC.  Will obtain TTS when able to evaluate him.  DM:  Stable.  Will continue with his meds.  HLD:  Continue with statin.  History of Seizure:  Will continue with Lamictal, decrease dose to 200mg  per day.  Afib:  He is not a candidate for full anticoagulation.  Will continue with his Dig.  His rate is controlled.     DVT prophylaxis: sub Q heparin. Code Status: full code.  Family Communication: wife and daughter at bedside.  Disposition Plan: TBD.  Consults called: None Admission status: inpatient.  Due to multiple medical problems including low Na, suidical ideation, and CHF, I suspect he will need more than 2 nights of hospitalization.    Dwayne Begay MD FACP. [facility]  If 7PM-7AM, please contact night-coverage [email or web address] Password [email or web address]  06/28/2016, 10:59 PM

## 2016-06-09 NOTE — ED Notes (Signed)
Pt angry and verbally abusive essentially asking if his other doctors are stupid? Asking why his sodium is low when it is supposed to be low and he is not supposed to eat salt because of his diabetes

## 2016-06-10 ENCOUNTER — Encounter: Payer: Self-pay | Admitting: Adult Health

## 2016-06-10 ENCOUNTER — Telehealth: Payer: Self-pay | Admitting: Adult Health

## 2016-06-10 ENCOUNTER — Encounter (HOSPITAL_COMMUNITY): Payer: Self-pay | Admitting: *Deleted

## 2016-06-10 ENCOUNTER — Inpatient Hospital Stay (HOSPITAL_COMMUNITY): Payer: Medicare HMO

## 2016-06-10 ENCOUNTER — Telehealth: Payer: Self-pay | Admitting: Neurology

## 2016-06-10 DIAGNOSIS — I255 Ischemic cardiomyopathy: Secondary | ICD-10-CM

## 2016-06-10 DIAGNOSIS — Z79899 Other long term (current) drug therapy: Secondary | ICD-10-CM

## 2016-06-10 DIAGNOSIS — Z87891 Personal history of nicotine dependence: Secondary | ICD-10-CM

## 2016-06-10 DIAGNOSIS — E871 Hypo-osmolality and hyponatremia: Secondary | ICD-10-CM

## 2016-06-10 DIAGNOSIS — F4323 Adjustment disorder with mixed anxiety and depressed mood: Secondary | ICD-10-CM | POA: Diagnosis present

## 2016-06-10 LAB — BASIC METABOLIC PANEL
Anion gap: 13 (ref 5–15)
BUN: 30 mg/dL — ABNORMAL HIGH (ref 6–20)
CHLORIDE: 94 mmol/L — AB (ref 101–111)
CO2: 24 mmol/L (ref 22–32)
Calcium: 9.8 mg/dL (ref 8.9–10.3)
Creatinine, Ser: 1.65 mg/dL — ABNORMAL HIGH (ref 0.61–1.24)
GFR calc non Af Amer: 38 mL/min — ABNORMAL LOW (ref >60)
GFR, EST AFRICAN AMERICAN: 44 mL/min — AB (ref >60)
Glucose, Bld: 133 mg/dL — ABNORMAL HIGH (ref 65–99)
POTASSIUM: 4.4 mmol/L (ref 3.5–5.1)
SODIUM: 131 mmol/L — AB (ref 135–145)

## 2016-06-10 LAB — CBC
HCT: 47 % (ref 39.0–52.0)
Hemoglobin: 15.7 g/dL (ref 13.0–17.0)
MCH: 33.3 pg (ref 26.0–34.0)
MCHC: 33.4 g/dL (ref 30.0–36.0)
MCV: 99.8 fL (ref 78.0–100.0)
PLATELETS: 153 10*3/uL (ref 150–400)
RBC: 4.71 MIL/uL (ref 4.22–5.81)
RDW: 15.4 % (ref 11.5–15.5)
WBC: 10.8 10*3/uL — AB (ref 4.0–10.5)

## 2016-06-10 LAB — GLUCOSE, CAPILLARY
GLUCOSE-CAPILLARY: 135 mg/dL — AB (ref 65–99)
GLUCOSE-CAPILLARY: 127 mg/dL — AB (ref 65–99)
Glucose-Capillary: 151 mg/dL — ABNORMAL HIGH (ref 65–99)
Glucose-Capillary: 127 mg/dL — ABNORMAL HIGH (ref 65–99)
Glucose-Capillary: 131 mg/dL — ABNORMAL HIGH (ref 65–99)

## 2016-06-10 LAB — ECHOCARDIOGRAM COMPLETE
Height: 64 in
WEIGHTICAEL: 2440.93 [oz_av]

## 2016-06-10 LAB — TSH: TSH: 1.382 u[IU]/mL (ref 0.350–4.500)

## 2016-06-10 LAB — FOLATE: Folate: 8.4 ng/mL (ref >5.9)

## 2016-06-10 LAB — VITAMIN B12: VITAMIN B 12: 231 pg/mL (ref 180–914)

## 2016-06-10 LAB — SEDIMENTATION RATE: Sed Rate: 1 mm/hr (ref 0–16)

## 2016-06-10 MED ORDER — ONDANSETRON HCL 4 MG PO TABS
4.0000 mg | ORAL_TABLET | Freq: Four times a day (QID) | ORAL | Status: DC | PRN
  Administered 2016-06-11: 4 mg via ORAL
  Filled 2016-06-10: qty 1

## 2016-06-10 MED ORDER — TRAMADOL HCL 50 MG PO TABS
50.0000 mg | ORAL_TABLET | Freq: Three times a day (TID) | ORAL | Status: DC | PRN
  Administered 2016-06-10 (×2): 50 mg via ORAL
  Filled 2016-06-10 (×2): qty 1

## 2016-06-10 MED ORDER — HEPARIN SODIUM (PORCINE) 5000 UNIT/ML IJ SOLN
5000.0000 [IU] | Freq: Three times a day (TID) | INTRAMUSCULAR | Status: DC
  Administered 2016-06-10 – 2016-06-13 (×11): 5000 [IU] via SUBCUTANEOUS
  Filled 2016-06-10 (×11): qty 1

## 2016-06-10 MED ORDER — VITAMIN D 1000 UNITS PO TABS
5000.0000 [IU] | ORAL_TABLET | Freq: Every day | ORAL | Status: DC
  Administered 2016-06-10: 5000 [IU] via ORAL
  Filled 2016-06-10 (×2): qty 5

## 2016-06-10 MED ORDER — DIGOXIN 125 MCG PO TABS
0.1250 mg | ORAL_TABLET | Freq: Every day | ORAL | Status: DC
  Administered 2016-06-10: 0.125 mg via ORAL
  Filled 2016-06-10 (×2): qty 1

## 2016-06-10 MED ORDER — ISOSORBIDE MONONITRATE ER 30 MG PO TB24
15.0000 mg | ORAL_TABLET | Freq: Every day | ORAL | Status: DC
  Administered 2016-06-10 (×2): 15 mg via ORAL
  Filled 2016-06-10 (×2): qty 1

## 2016-06-10 MED ORDER — LAMOTRIGINE ER 200 MG PO TB24
200.0000 mg | ORAL_TABLET | Freq: Every day | ORAL | Status: DC
  Filled 2016-06-10: qty 1

## 2016-06-10 MED ORDER — INSULIN ASPART 100 UNIT/ML ~~LOC~~ SOLN
0.0000 [IU] | Freq: Three times a day (TID) | SUBCUTANEOUS | Status: DC
  Administered 2016-06-10: 1 [IU] via SUBCUTANEOUS
  Administered 2016-06-10: 2 [IU] via SUBCUTANEOUS
  Administered 2016-06-10: 1 [IU] via SUBCUTANEOUS
  Administered 2016-06-11: 2 [IU] via SUBCUTANEOUS
  Administered 2016-06-12: 1 [IU] via SUBCUTANEOUS
  Administered 2016-06-13: 2 [IU] via SUBCUTANEOUS
  Administered 2016-06-13: 1 [IU] via SUBCUTANEOUS

## 2016-06-10 MED ORDER — PERFLUTREN LIPID MICROSPHERE
1.0000 mL | INTRAVENOUS | Status: AC | PRN
  Administered 2016-06-10: 2 mL via INTRAVENOUS
  Filled 2016-06-10: qty 10

## 2016-06-10 MED ORDER — LAMOTRIGINE 100 MG PO TABS
100.0000 mg | ORAL_TABLET | Freq: Two times a day (BID) | ORAL | Status: DC
  Administered 2016-06-10 (×2): 100 mg via ORAL
  Filled 2016-06-10 (×3): qty 1

## 2016-06-10 MED ORDER — MAGNESIUM OXIDE 400 (241.3 MG) MG PO TABS
400.0000 mg | ORAL_TABLET | Freq: Two times a day (BID) | ORAL | Status: DC
  Administered 2016-06-10 (×2): 400 mg via ORAL
  Filled 2016-06-10 (×2): qty 1

## 2016-06-10 MED ORDER — MAGNESIUM OXIDE 400 (241.3 MG) MG PO TABS
400.0000 mg | ORAL_TABLET | Freq: Two times a day (BID) | ORAL | Status: DC
  Administered 2016-06-10: 400 mg via ORAL
  Filled 2016-06-10 (×2): qty 1

## 2016-06-10 MED ORDER — IPRATROPIUM-ALBUTEROL 0.5-2.5 (3) MG/3ML IN SOLN
3.0000 mL | Freq: Three times a day (TID) | RESPIRATORY_TRACT | Status: DC | PRN
  Administered 2016-06-10 (×2): 3 mL via RESPIRATORY_TRACT
  Filled 2016-06-10 (×2): qty 3

## 2016-06-10 MED ORDER — SODIUM CHLORIDE 0.9% FLUSH
3.0000 mL | Freq: Two times a day (BID) | INTRAVENOUS | Status: DC
  Administered 2016-06-10 – 2016-06-18 (×16): 3 mL via INTRAVENOUS

## 2016-06-10 MED ORDER — POTASSIUM CHLORIDE ER 10 MEQ PO TBCR
10.0000 meq | EXTENDED_RELEASE_TABLET | Freq: Every day | ORAL | Status: DC
  Administered 2016-06-10: 10 meq via ORAL
  Filled 2016-06-10 (×5): qty 1

## 2016-06-10 MED ORDER — METFORMIN HCL 500 MG PO TABS
500.0000 mg | ORAL_TABLET | Freq: Two times a day (BID) | ORAL | Status: DC
  Administered 2016-06-10 (×2): 500 mg via ORAL
  Filled 2016-06-10 (×2): qty 1

## 2016-06-10 MED ORDER — ASPIRIN EC 81 MG PO TBEC
81.0000 mg | DELAYED_RELEASE_TABLET | Freq: Every day | ORAL | Status: DC
  Administered 2016-06-10: 81 mg via ORAL
  Filled 2016-06-10 (×2): qty 1

## 2016-06-10 MED ORDER — FUROSEMIDE 10 MG/ML IJ SOLN
40.0000 mg | Freq: Every day | INTRAMUSCULAR | Status: DC
  Administered 2016-06-10 – 2016-06-11 (×2): 40 mg via INTRAVENOUS
  Filled 2016-06-10 (×2): qty 4

## 2016-06-10 MED ORDER — PRAVASTATIN SODIUM 40 MG PO TABS
40.0000 mg | ORAL_TABLET | Freq: Every day | ORAL | Status: DC
  Administered 2016-06-10: 40 mg via ORAL
  Filled 2016-06-10 (×2): qty 1

## 2016-06-10 MED ORDER — MUSCLE RUB 10-15 % EX CREA
TOPICAL_CREAM | CUTANEOUS | Status: DC | PRN
  Filled 2016-06-10: qty 85

## 2016-06-10 MED ORDER — INSULIN ASPART 100 UNIT/ML ~~LOC~~ SOLN: 0.0000 [IU] | Freq: Every day | SUBCUTANEOUS | Status: DC

## 2016-06-10 MED ORDER — ONDANSETRON HCL 4 MG/2ML IJ SOLN: 4.0000 mg | Freq: Four times a day (QID) | INTRAMUSCULAR | Status: DC | PRN

## 2016-06-10 MED ORDER — HYDROCODONE-ACETAMINOPHEN 5-325 MG PO TABS
1.0000 | ORAL_TABLET | Freq: Four times a day (QID) | ORAL | Status: DC | PRN
  Administered 2016-06-10 – 2016-06-13 (×2): 1 via ORAL
  Filled 2016-06-10 (×2): qty 1

## 2016-06-10 NOTE — BH Assessment (Signed)
Attempted to assess pt. Issues with the telepsych cart. Edwena Bunde, RN is currently attempting to set up TTS cart.  Princess Bruins, MSW, Theresia Majors

## 2016-06-10 NOTE — Plan of Care (Signed)
Problem: Safety: Goal: Ability to remain free from injury will improve Outcome: Progressing Pt has suicide sitter at bedside

## 2016-06-10 NOTE — Progress Notes (Signed)
Patient specifically states he has no intentions of hurting himself or others he just stated those words out of discussed in failing health states he would not hurt himself due to religious beliefs. Patient will require physical therapy for strengthening to prior and falls we'll obtain 2-D echocardiogram to assess ischemic cardiopathy ejection fraction 20-25% monitor be met daily for low sodium and due to dementia panel workup he's had 3 stents previously [name] HEN:[id number] DOB: 1934-09-03 DOA: 06/26/2016 PCP: Maricela Curet, MD   Physical Exam: Blood pressure 110/76, pulse 95, temperature 97.4 F (36.3 C), temperature source Oral, resp. rate 20, height _0  (1.626 m), weight 69.2 kg (152 lb 8.9 oz), SpO2 98 %. Lungs diminished breath sounds in the bases no rales wheeze rhonchi appreciable heart irregular regular no S3 no heaves thrills rubs abdomen soft nontender bowel sounds normoactive traced 1+ chronic pedal edema   Investigations:  No results found for this or any previous visit (from the past 240 hour(s)).   Basic Metabolic Panel:  Recent Labs  06/14/2016 2111 06/10/16 0455  NA 129* 131*  K 4.4 4.4  CL 96* 94*  CO2 22 24  GLUCOSE 158* 133*  BUN 28* 30*  CREATININE 1.59* 1.65*  CALCIUM 9.8 9.8   Liver Function Tests:  Recent Labs  06/11/2016 2111  AST 25  ALT 16*  ALKPHOS 69  BILITOT 1.8*  PROT 6.5  ALBUMIN 4.0     CBC:  Recent Labs  06/06/2016 2111  WBC 10.4  NEUTROABS 7.2  HGB 15.8  HCT 46.5  MCV 100.2*  PLT 152    Dg Chest 2 View  Result Date: 07/03/2016 CLINICAL DATA:  Lost balance and fell 2 days ago, striking his head on wall. Bilateral lower extremity pitting edema. Dyspnea for several months. EXAM: CHEST  2 VIEW COMPARISON:  04/30/2016 FINDINGS: Stable cardiomegaly. The lungs are clear. Possible trace right pleural effusion. Pulmonary vasculature is normal. The transvenous cardiac lead appears grossly intact. IMPRESSION: Stable cardiomegaly.  Possible trace right pleural effusion. No interstitial or alveolar edema. Electronically Signed   By: Andreas Newport M.D.   On: 06/18/2016 21:55   Ct Head Wo Contrast  Result Date: 06/12/2016 CLINICAL DATA:  Fall with head injury.  Initial encounter. EXAM: CT HEAD WITHOUT CONTRAST CT CERVICAL SPINE WITHOUT CONTRAST TECHNIQUE: Multidetector CT imaging of the head and cervical spine was performed following the standard protocol without intravenous contrast. Multiplanar CT image reconstructions of the cervical spine were also generated. COMPARISON:  07/12/2015 FINDINGS: CT HEAD FINDINGS Brain: No evidence of acute infarction, hemorrhage, hydrocephalus, or mass lesion. Variable extra-axial space thickness around the frontal lobes is stable from 2017. No definite extra-axial collection. Generalized atrophy, stable. Vascular: Atherosclerotic calcification. Skull: Negative for fracture. Sinuses/Orbits: Bilateral cataract resection.  No acute finding CT CERVICAL SPINE FINDINGS Alignment: Normal. Skull base and vertebrae: No acute fracture. No primary bone lesion or focal pathologic process. Soft tissues and spinal canal: No prevertebral fluid or swelling. No visible canal hematoma. Disc levels: Mild generalized disc narrowing and annulus calcification. No identified impingement. Upper chest: No acute finding IMPRESSION: No evidence of intracranial or cervical spine injury. Electronically Signed   By: Monte Fantasia M.D.   On: 07/05/2016 21:53   Ct Cervical Spine Wo Contrast  Result Date: 06/20/2016 CLINICAL DATA:  Fall with head injury.  Initial encounter. EXAM: CT HEAD WITHOUT CONTRAST CT CERVICAL SPINE WITHOUT CONTRAST TECHNIQUE: Multidetector CT imaging of the head and cervical spine was performed following the standard  protocol without intravenous contrast. Multiplanar CT image reconstructions of the cervical spine were also generated. COMPARISON:  07/12/2015 FINDINGS: CT HEAD FINDINGS Brain: No evidence of  acute infarction, hemorrhage, hydrocephalus, or mass lesion. Variable extra-axial space thickness around the frontal lobes is stable from 2017. No definite extra-axial collection. Generalized atrophy, stable. Vascular: Atherosclerotic calcification. Skull: Negative for fracture. Sinuses/Orbits: Bilateral cataract resection.  No acute finding CT CERVICAL SPINE FINDINGS Alignment: Normal. Skull base and vertebrae: No acute fracture. No primary bone lesion or focal pathologic process. Soft tissues and spinal canal: No prevertebral fluid or swelling. No visible canal hematoma. Disc levels: Mild generalized disc narrowing and annulus calcification. No identified impingement. Upper chest: No acute finding IMPRESSION: No evidence of intracranial or cervical spine injury. Electronically Signed   By: Monte Fantasia M.D.   On: 07/03/2016 21:53      Medications:   Impression:  Principal Problem:   Adjustment disorder with mixed anxiety and depressed mood Active Problems:   Permanent atrial fibrillation ([place])   Fall   S/P ICD (internal cardiac defibrillator) procedure, 08/17/12, [employer] implanted   Diabetes type 2, uncontrolled ([place])   HLD (hyperlipidemia)   Systolic CHF, chronic (HCC)   Hyponatremia   Suicidal ideation     Plan: 2-D echo to assess current LV function to discontinue one-on-one suicide prevention therapy sitter. Monitor B med daily 3 physical therapy for strengthening and ambulation patient is too high risk for anticoagulation due to previous falls and intracerebral bleed  Consultants:    Procedures   Antibiotics:        Time spent: 45 minutes   LOS: 1 day   Krizia Flight M   06/10/2016, 1:17 PM

## 2016-06-10 NOTE — Telephone Encounter (Signed)
I called the daughter. No answer. Her voicemail is full. I will try again later.

## 2016-06-10 NOTE — Clinical Social Work Note (Signed)
Pt psychiatrically cleared. CSW rescinded IVC at MD request. Confirmed received by clerk of court.   Derenda Fennel, LCSW [phone]

## 2016-06-10 NOTE — Consult Note (Signed)
Telepsych Consultation   Reason for Consult:  Statements about self-harm when frustrated with medical care Referring Physician:  [facility] group Patient Identification: ONEIL BEHNEY MRN:  [id number] Principal Diagnosis: Adjustment disorder with mixed anxiety and depressed mood Diagnosis:   Patient Active Problem List   Diagnosis Date Noted  . Adjustment disorder with mixed anxiety and depressed mood [F43.23] 06/10/2016    Priority: High  . Hyponatremia [E87.1] 06/29/2016  . Suicidal ideation [R45.851] 06/08/2016  . Seizures ([place]) [R56.9] 04/03/2016  . Acute on chronic combined systolic and diastolic CHF (congestive heart failure) ([place]) [I50.43] 02/17/2015  . Chest pain [R07.9] 10/21/2014  . Transient alteration of awareness [R40.4]   . Systolic CHF, chronic ([place]) [I50.22]   . Subarachnoid hemorrhage ([place]) [I60.9]   . Cerebral thrombosis with cerebral infarction ([place]) [I63.30] 10/17/2014  . Seizure-like activity ([place]) [R56.9] 10/16/2014  . Syncope [R55]   . Subdural hematoma ([place]) [I62.00]   . Syncope and collapse [R55]   . Chronic systolic heart failure ([place]) [I50.22]   . Elevated troponin [R74.8]   . Other emphysema ([place]) [J43.8]   . Diabetes type 2, uncontrolled ([place]) [E11.65]   . HLD (hyperlipidemia) [E78.5]   . ACS (acute coronary syndrome) ([place]) [I24.9] 04/05/2014  . Sepsis ([place]) [A41.9] 04/05/2014  . Coronary artery disease [I25.10] 01/05/2014  . Cardiomyopathy, ischemic [I25.5] 03/26/2013  . Obstructive sleep apnea [G47.33] 03/26/2013  . Chronic combined systolic and diastolic CHF, NYHA class 2 ([place]) [I50.42] 09/24/2012  . At risk for sudden cardiac death [Z91.89] 2012-09-10  . S/P ICD (internal cardiac defibrillator) procedure, 08/17/12, [employer] implanted [employer] 09-10-2012  . Fall [W19.XXXA] 02/28/2012  . Traumatic subarachnoid hemorrhage ([place]) [S06.6X9A] 02/28/2012  . Multiple fractures of ribs of left side [S22.42XA] 02/28/2012  . Hyperkalemia [E87.5]  06/25/2011  . Ureterolithiasis [N20.1] 06/25/2011  . Laboratory test [Z01.89] 04/29/2011  . COPD (chronic obstructive pulmonary disease) ([place]) [J44.9]   . Chronic anticoagulation, coumadin [Z79.01]   . Permanent atrial fibrillation ([place]) [I48.2] 02/15/2011  . DIABETES MELLITUS, TYPE II [E11.9] 10/07/2008  . OBSTRUCTIVE SLEEP APNEA [G47.33] 10/07/2008  . Arteriosclerotic cardiovascular disease (ASCVD) [I25.10] 10/07/2008    Total Time spent with patient: 30 minutes  Subjective:   SULAIMAN IMBERT is a 82 y.o. male patient admitted with reports of making a statement he wanted to shoot himself when he was upset about going to the hospital again. Pt seen and chart reviewed. Pt is alert/oriented x4, calm, cooperative, and appropriate to situation. Pt denies suicidal/homicidal ideation and psychosis and does not appear to be responding to internal stimuli. Pt reports that he absolutely does not want to harm himself and never did and proceeded to ask me: "Did you ever see anyone say something they didn't mean when they were mad? That was me."  *Collateral from daughter Joycelyn Schmid, living with him was supportive of lacking suicidal ideation. Joycelyn Schmid stated that "he has never been suicidal or threatened to harm himself. He is mad at the doctors because they keep increasing his lasix which makes him a fall risk and have to use the restroom all the time." Joycelyn Schmid stated that pt has a temper at times, but has never been violent or aggressive toward anyone now or historically. When asked if she felt he was at risk for self-harm, she reported that she felt safe with him coming home. Additionally, she reports that the family is in agreement to remove guns for the house and will call 911 if they feel the pt is at risk  for self-harm. Marland Kitchen  HPI:  I have reviewed and concur with HPI elements below, modified as follows: "Nazar BAYANI RENTERIA is an 82 y.o. male who presents to the ED originally due to a fall in his home. While in  the ED pt made a suicidal statement to the staff reportedly telling staff that he wanted to "blow his brains out." Pt then became agitated with staff after being told he was now on suicide watch. Pt was then placed on IVC and evaluation was ordered. Pt reports he is not suicidal and asked the assessor "haven't you ever said something that you did not mean out of anger when you were upset?" Pt reports he made the statement because he does not like being in hospitals and has anxiety due to being forced to stay "for a long time" when he comes to hospitals. Pt appeared irritable during the initial part of the assessment, however after several minutes the pt was observed as smiling and engaging in a pleasant demeanor with the assessor. Pt reports that he has several guns at home including "4 or 5 assault rifles, remington 1100, 9 mm pistol, 12 gauge double barrel shotgun, a 44" and multiple other guns in the home. Pt stated "i'm thinking about buying another one here soon." Pt reports he is not suicidal but he was frustrated and has multiple medical issues. Pt reports he recently fell and has been unsteady on his feet for the past several days. Pt reports he is a diabetic and is on a restricted diet. Pt reports he has fallen in the past and had a concussion and was "bleeeding on the brain." Pt reports he sometimes wakes up and gasps for air due to not being able to breathe properly. Pt denies HI and AVH. Pt reports he lives at home with his wife/daughter and would like to go back home. Pt endorses some depressive symptoms including insomnia, sporadic irritability and angry moods, and loss of appetite including an inability to eat full meals"  Past Psychiatric History: none  Risk to Self: Suicidal Ideation: No Suicidal Intent: No Is patient at risk for suicide?: No Suicidal Plan?: No Access to Means: No What has been your use of drugs/alcohol within the last 12 months?: denies use  Triggers for Past Attempts:  None known Intentional Self Injurious Behavior: None Risk to Others: Homicidal Ideation: No Thoughts of Harm to Others: No Current Homicidal Intent: No Current Homicidal Plan: No Access to Homicidal Means: No History of harm to others?: No Assessment of Violence: None Noted Does patient have access to weapons?: Yes (Comment) (pt reports access to more than 8 guns at home) Criminal Charges Pending?: No Does patient have a court date: No Prior Inpatient Therapy: Prior Inpatient Therapy: No Prior Outpatient Therapy: Prior Outpatient Therapy: No Does patient have an ACCT team?: No Does patient have Intensive In-House Services?  : No Does patient have [facility] services? : No Does patient have P4CC services?: No  Past Medical History:  Past Medical History:  Diagnosis Date  . Arteriosclerotic cardiovascular disease (ASCVD)   . At risk for sudden cardiac death 08-25-2012  . Atrial fibrillation ([place])    Onset in 2012  . Atrial flutter ([place])   . Bleeding in brain due to brain aneurysm [facility]) October 06 2014  . CAD (coronary artery disease)    a. s/p DES to LAD in 1999 b. DES to RCA in 2004 c. NST in 2014 showing scar along LAD territory and borderline ischemia along  RCA  . CHF (congestive heart failure) ([place])   . Chronic anticoagulation 2012   2012  . COPD (chronic obstructive pulmonary disease) ([place])   . DJD (degenerative joint disease)   . Gout   . Hyperlipidemia   . ICD (implantable cardiac defibrillator) in place   . Ischemic cardiomyopathy   . Kidney stone    "just once" (08/17/2012)  . Myocardial infarction 1998  . NICM (nonischemic cardiomyopathy), EF 25-30% 08/18/2012  . Obstructive sleep apnea    "went away when I lost a bunch of weight" (08/17/2012)  . Permanent atrial fibrillation ([place]) 02/15/2011   Initial onset in 03/2011 with rapid ventricular response   . S/P ICD (internal cardiac defibrillator) procedure, 08/17/12, [employer] 08/18/2012   [employer]  . Type  II diabetes mellitus ([place])     Past Surgical History:  Procedure Laterality Date  . CARDIAC CATHETERIZATION  02/2003  . CARDIAC DEFIBRILLATOR PLACEMENT  08/17/2012   [employer]  . CATARACT EXTRACTION W/ INTRAOCULAR LENS  IMPLANT, BILATERAL Bilateral ~ 2011  . CHOLECYSTECTOMY  2009  . CORONARY ANGIOPLASTY WITH STENT PLACEMENT  07/14/1996   "1" (08/17/2012)  . CYSTOSCOPY/RETROGRADE/URETEROSCOPY  06/28/2011   Procedure: CYSTOSCOPY/RETROGRADE/URETEROSCOPY;  Surgeon: Marissa Nestle, MD;  Location: [facility];  Service: Urology;  Laterality: Right;  . IMPLANTABLE CARDIOVERTER DEFIBRILLATOR IMPLANT N/A 08/17/2012   Procedure: IMPLANTABLE CARDIOVERTER DEFIBRILLATOR IMPLANT;  Surgeon: Sanda Klein, MD;  Location: [facility] CATH [facility];  Service: Cardiovascular;  Laterality: N/A;  . KNEE ARTHROPLASTY Left 1978   "tendon & cartilege repair" (08/17/2012)  . Myoview perfusion scan  06/02/2012   low risk, extensive scar entire LAD & RCA territory  . s/p icd  08/2012   [employer]  . STONE EXTRACTION WITH BASKET  06/28/2011   Procedure: STONE EXTRACTION WITH BASKET;  Surgeon: Marissa Nestle, MD;  Location: [facility];  Service: Urology;  Laterality: Right;  specimen given to family per MD  . US ECHOCARDIOGRAPHY  07/08/2012   EF <20%,mild MR,TR,LA severely dilated  . VASECTOMY  ~ 96   Family History:  Family History  Problem Relation Age of Onset  . Heart failure Mother   . Heart failure Father    Family Psychiatric  History: denies Social History:  History  Alcohol Use No    Comment: 08/17/2012 "quit drinking in 1983"     History  Drug Use No    Social History   Social History  . Marital status: Married    Spouse name: N/A  . Number of children: 3  . Years of education: 9th   Occupational History  . Retired    Social History Main Topics  . Smoking status: Former Smoker    Packs/day: 2.00    Years: 40.00    Types: Cigarettes    Quit date: 04/08/1992  . Smokeless tobacco: Former Systems developer  .  Alcohol use No     Comment: 08/17/2012 "quit drinking in 1983"  . Drug use: No  . Sexual activity: No   Other Topics Concern  . None   Social History Narrative   Lives at home with his wife.   Right-handed.   No caffeine use.   Additional Social History:    Allergies:   Allergies  Allergen Reactions  . Novocain [Procaine] Nausea And Vomiting and Other (See Comments)    Passes out (also)  . Lipitor [Atorvastatin] Other (See Comments)    Myalgias    . Procaine Hcl Nausea And Vomiting  . Tramadol Itching and Nausea  And Vomiting    Labs:  Results for orders placed or performed during the hospital encounter of 07/02/2016 (from the past 48 hour(s))  Comprehensive metabolic panel     Status: Abnormal   Collection Time: 06/28/2016  9:11 PM  Result Value Ref Range   Sodium 129 (L) 135 - 145 mmol/L   Potassium 4.4 3.5 - 5.1 mmol/L   Chloride 96 (L) 101 - 111 mmol/L   CO2 22 22 - 32 mmol/L   Glucose, Bld 158 (H) 65 - 99 mg/dL   BUN 28 (H) 6 - 20 mg/dL   Creatinine, Ser 1.59 (H) 0.61 - 1.24 mg/dL   Calcium 9.8 8.9 - 10.3 mg/dL   Total Protein 6.5 6.5 - 8.1 g/dL   Albumin 4.0 3.5 - 5.0 g/dL   AST 25 15 - 41 U/L   ALT 16 (L) 17 - 63 U/L   Alkaline Phosphatase 69 38 - 126 U/L   Total Bilirubin 1.8 (H) 0.3 - 1.2 mg/dL   GFR calc non Af Amer 40 (L) >60 mL/min   GFR calc Af Amer 46 (L) >60 mL/min    Comment: (NOTE) The eGFR has been calculated using the CKD EPI equation. This calculation has not been validated in all clinical situations. eGFR's persistently <60 mL/min signify possible Chronic Kidney Disease.    Anion gap 11 5 - 15  Lipase, blood     Status: None   Collection Time: 06/29/2016  9:11 PM  Result Value Ref Range   Lipase 13 11 - 51 U/L  CBC with Differential     Status: Abnormal   Collection Time: 06/14/2016  9:11 PM  Result Value Ref Range   WBC 10.4 4.0 - 10.5 K/uL   RBC 4.64 4.22 - 5.81 MIL/uL   Hemoglobin 15.8 13.0 - 17.0 g/dL   HCT 46.5 39.0 - 52.0 %   MCV  100.2 (H) 78.0 - 100.0 fL   MCH 34.1 (H) 26.0 - 34.0 pg   MCHC 34.0 30.0 - 36.0 g/dL   RDW 15.3 11.5 - 15.5 %   Platelets 152 150 - 400 K/uL   Neutrophils Relative % 69 %   Neutro Abs 7.2 1.7 - 7.7 K/uL   Lymphocytes Relative 21 %   Lymphs Abs 2.1 0.7 - 4.0 K/uL   Monocytes Relative 10 %   Monocytes Absolute 1.0 0.1 - 1.0 K/uL   Eosinophils Relative 0 %   Eosinophils Absolute 0.0 0.0 - 0.7 K/uL   Basophils Relative 0 %   Basophils Absolute 0.0 0.0 - 0.1 K/uL  Troponin I     Status: Abnormal   Collection Time: 07/04/2016  9:11 PM  Result Value Ref Range   Troponin I 0.06 (HH) <0.03 ng/mL    Comment: CRITICAL RESULT CALLED TO, READ BACK BY AND VERIFIED WITH: TUTTLE,A. [id number] ON 06/14/2016 BY EVA   Brain natriuretic peptide     Status: Abnormal   Collection Time: 07/02/2016  9:11 PM  Result Value Ref Range   B Natriuretic Peptide 1,879.0 (H) 0.0 - 100.0 pg/mL  Digoxin level     Status: None   Collection Time: 06/12/2016  9:11 PM  Result Value Ref Range   Digoxin Level 1.8 0.8 - 2.0 ng/mL  Ethanol     Status: None   Collection Time: 06/08/2016  9:11 PM  Result Value Ref Range   Alcohol, Ethyl (B) <5 <5 mg/dL    Comment:        LOWEST DETECTABLE LIMIT FOR SERUM  ALCOHOL IS 5 mg/dL FOR MEDICAL PURPOSES ONLY   Acetaminophen level     Status: Abnormal   Collection Time: 06/18/2016  9:11 PM  Result Value Ref Range   Acetaminophen (Tylenol), Serum <10 (L) 10 - 30 ug/mL    Comment:        THERAPEUTIC CONCENTRATIONS VARY SIGNIFICANTLY. A RANGE OF 10-30 ug/mL MAY BE AN EFFECTIVE CONCENTRATION FOR MANY PATIENTS. HOWEVER, SOME ARE BEST TREATED AT CONCENTRATIONS OUTSIDE THIS RANGE. ACETAMINOPHEN CONCENTRATIONS >150 ug/mL AT 4 HOURS AFTER INGESTION AND >50 ug/mL AT 12 HOURS AFTER INGESTION ARE OFTEN ASSOCIATED WITH TOXIC REACTIONS.   Salicylate level     Status: None   Collection Time: 06/11/2016  9:11 PM  Result Value Ref Range   Salicylate Lvl <9.3 2.8 - 30.0 mg/dL  Glucose,  capillary     Status: Abnormal   Collection Time: 06/10/16  1:27 AM  Result Value Ref Range   Glucose-Capillary 135 (H) 65 - 99 mg/dL   Comment 1 Notify RN    Comment 2 Document in Chart   Basic metabolic panel     Status: Abnormal   Collection Time: 06/10/16  4:55 AM  Result Value Ref Range   Sodium 131 (L) 135 - 145 mmol/L   Potassium 4.4 3.5 - 5.1 mmol/L   Chloride 94 (L) 101 - 111 mmol/L   CO2 24 22 - 32 mmol/L   Glucose, Bld 133 (H) 65 - 99 mg/dL   BUN 30 (H) 6 - 20 mg/dL   Creatinine, Ser 1.65 (H) 0.61 - 1.24 mg/dL   Calcium 9.8 8.9 - 10.3 mg/dL   GFR calc non Af Amer 38 (L) >60 mL/min   GFR calc Af Amer 44 (L) >60 mL/min    Comment: (NOTE) The eGFR has been calculated using the CKD EPI equation. This calculation has not been validated in all clinical situations. eGFR's persistently <60 mL/min signify possible Chronic Kidney Disease.    Anion gap 13 5 - 15  Glucose, capillary     Status: Abnormal   Collection Time: 06/10/16  7:40 AM  Result Value Ref Range   Glucose-Capillary 127 (H) 65 - 99 mg/dL   Comment 1 Notify RN   Glucose, capillary     Status: Abnormal   Collection Time: 06/10/16 11:58 AM  Result Value Ref Range   Glucose-Capillary 151 (H) 65 - 99 mg/dL   Comment 1 Notify RN     Current Facility-Administered Medications  Medication Dose Route Frequency Provider Last Rate Last Dose  . aspirin EC tablet 81 mg  81 mg Oral Daily Orvan Falconer, MD   81 mg at 06/10/16 0845  . cholecalciferol (VITAMIN D) tablet 5,000 Units  5,000 Units Oral Daily Orvan Falconer, MD   5,000 Units at 06/10/16 [phone]  . digoxin (LANOXIN) tablet 0.125 mg  0.125 mg Oral Daily Orvan Falconer, MD   0.125 mg at 06/10/16 0844  . furosemide (LASIX) injection 40 mg  40 mg Intravenous Daily Orvan Falconer, MD   40 mg at 06/10/16 0845  . heparin injection 5,000 Units  5,000 Units Subcutaneous Q8H Orvan Falconer, MD   5,000 Units at 06/10/16 1214  . insulin aspart (novoLOG) injection 0-5 Units  0-5 Units Subcutaneous QHS  Orvan Falconer, MD      . insulin aspart (novoLOG) injection 0-9 Units  0-9 Units Subcutaneous TID WC Orvan Falconer, MD   2 Units at 06/10/16 1214  . isosorbide mononitrate (IMDUR) 24 hr tablet 15 mg  15 mg Oral  QHS Orvan Falconer, MD   15 mg at 06/10/16 0150  . lamoTRIgine (LAMICTAL) tablet 100 mg  100 mg Oral BID Lucia Gaskins, MD   100 mg at 06/10/16 0845  . magnesium oxide (MAG-OX) tablet 400 mg  400 mg Oral BID Orvan Falconer, MD   400 mg at 06/10/16 0845  . metFORMIN (GLUCOPHAGE) tablet 500 mg  500 mg Oral BID WC Orvan Falconer, MD   500 mg at 06/10/16 0845  . ondansetron (ZOFRAN) tablet 4 mg  4 mg Oral Q6H PRN Orvan Falconer, MD       Or  . ondansetron [facility]) injection 4 mg  4 mg Intravenous Q6H PRN Orvan Falconer, MD      . potassium chloride (K-DUR) CR tablet 10 mEq  10 mEq Oral Daily Orvan Falconer, MD   10 mEq at 06/10/16 0845  . pravastatin (PRAVACHOL) tablet 40 mg  40 mg Oral Daily Orvan Falconer, MD   40 mg at 06/10/16 0845  . sodium chloride flush (NS) 0.9 % injection 3 mL  3 mL Intravenous Q12H Orvan Falconer, MD   3 mL at 06/10/16 0846    Musculoskeletal: UTO, camera  Psychiatric Specialty Exam: Physical Exam  Review of Systems  Psychiatric/Behavioral: Negative for depression, hallucinations, substance abuse and suicidal ideas. The patient is not nervous/anxious and does not have insomnia.   All other systems reviewed and are negative.   Blood pressure 110/76, pulse 95, temperature 97.4 F (36.3 C), temperature source Oral, resp. rate 20, height 5' 4" (1.626 m), weight 69.2 kg (152 lb 8.9 oz), SpO2 98 %.Body mass index is 26.19 kg/m.  General Appearance: Casual and Fairly Groomed  Eye Contact:  Good  Speech:  Clear and Coherent and Normal Rate  Volume:  Normal  Mood:  Euthymic  Affect:  Appropriate and Congruent  Thought Process:  Coherent, Goal Directed, Linear and Descriptions of Associations: Intact  Orientation:  Full (Time, Place, and Person)  Thought Content:  Focused on discharge plans  Suicidal Thoughts:  No   Homicidal Thoughts:  No  Memory:  Immediate;   Fair Recent;   Fair Remote;   Fair  Judgement:  Good  Insight:  Fair  Psychomotor Activity:  Decreased  Concentration:  Concentration: Good and Attention Span: Good  Recall:  [employer] of Knowledge:  Fair  Language:  Fair  Akathisia:  No  Handed:    AIMS (if indicated):     Assets:  Communication Skills Desire for Improvement Resilience Social Support  ADL's:  Intact  Cognition:  WNL  Sleep:      Treatment Plan Summary: Adjustment disorder with mixed anxiety and depressed mood stable for outpatient management;  Disposition: No evidence of imminent risk to self or others at present.   Patient does not meet criteria for psychiatric inpatient admission. Supportive therapy provided about ongoing stressors. Discussed crisis plan, support from social network, calling 911, coming to the Emergency Department, and calling Suicide Hotline. Safety plan in place as above in narrative  Benjamine Mola, [place] 06/10/2016 12:41 PM

## 2016-06-10 NOTE — Evaluation (Signed)
Physical Therapy Evaluation Patient Details Name: [name] MRN: [id number] DOB: 1935/03/23 Today's Date: 06/10/2016   History of Present Illness  [name] is a 82yo white male who comes to [facility] after sustaining a fall at home and hitting his head. Daughter reports he received a new medication on prior Tuesday at a new dosage and had been dizzing thereafter.  PMH: afib, hx of intraccranial bleed, CAD, DM2, s/p ICD placement, CHF, COPD. The patient was inititally under IVC due to SI, however has since been cleared by CSW. /at baseline, patient has balance impairments with history of falls, He performs mostly household distance AMB with intermittent access of the yard, but does not often access the community.   Clinical Impression  Pt admitted with above diagnosis. Pt currently with functional limitations due to the deficits listed below (see "PT Problem List"). Upon entry, the patient is received seated at EOB eating snacks with daughter and wife. The pt is awake and agreeable to participate. No acute distress noted at this time, pt only reporting some mild, nonconcerning pain. The pt is alert and oriented x3, conversational, and following simple commands when he is agreeable. He is frequently sarcastic in responses to question, and easily distractible, often changing the subject when trying to get baseline function information. Pt received on and remaining on 3L O2 throughout evaluation, whereas pt is not on O2 at baseline at home.Functional mobility assessment demonstrates mild weakness, but the patient performs at his baseline level. The patient sustains a major LOB while performing turns in the room. The patient is at high risk for falls as evidence by slow gait speed <1.21m/s, forward reach <5", and LOB demonstrated throughout session.   Pt will benefit from skilled PT intervention to increase independence and safety with basic mobility in preparation for discharge to the venue listed below.        Follow Up Recommendations Home health PT;Supervision/Assistance - 24 hour Pt is not agreeable and reports that is able to improve his balance on his own, even though he has had several falls in the past.     Equipment Recommendations  None recommended by PT    Recommendations for Other Services       Precautions / Restrictions Precautions Precautions: None Restrictions Weight Bearing Restrictions: No      Mobility  Bed Mobility               General bed mobility comments: received seated EOB  Transfers Overall transfer level: Modified independent Equipment used: None                Ambulation/Gait Ambulation/Gait assistance: Min guard Ambulation Distance (Feet): 90 Feet (unshod; refuses more due to chronic foot pain and no shoes available. ) Assistive device: None Gait Pattern/deviations: Wide base of support   Gait velocity interpretation: <1.8 ft/sec, indicative of risk for recurrent falls General Gait Details: unsteady and slow, but no major LOB   Stairs            Wheelchair Mobility    Modified Rankin (Stroke Patients Only)       Balance Overall balance assessment: History of Falls;Needs assistance             Standing balance comment: Sustained narrow stance eyes closed for 10 seconds without LOB; 1LOB noted during turning in place, requires minA to recover.              High level balance activites: Turns  Pertinent Vitals/Pain Pain Assessment:  (Reports some Right hip pain from lying in th ebed; does not rate. )    Home Living Family/patient expects to be discharged to:: Private residence Living Arrangements: Spouse/significant other Available Help at Discharge: Family;Available 24 hours/day Type of Home: Mobile home Home Access: Level entry     Home Layout: One level Home Equipment: Walker - 2 wheels;Walker - 4 wheels      Prior Function Level of Independence: Needs assistance   Gait /  Transfers Assistance Needed: Difficulty with limited community distance gait; difficulty with steps in the epast ,           Hand Dominance        Extremity/Trunk Assessment        Lower Extremity Assessment Lower Extremity Assessment: Overall WFL for tasks assessed       Communication   Communication: No difficulties (circumlocution and heavy sarcasm. )  Cognition Arousal/Alertness: Awake/alert Behavior During Therapy: Impulsive;Agitated Overall Cognitive Status: Within Functional Limits for tasks assessed                      General Comments      Exercises     Assessment/Plan    PT Assessment Patient needs continued PT services  PT Problem List Decreased strength;Decreased activity tolerance;Decreased balance;Pain;Decreased mobility       PT Treatment Interventions Balance training;Gait training;Therapeutic exercise;Therapeutic activities;Patient/family education    PT Goals (Current goals can be found in the Care Plan section)  Acute Rehab PT Goals PT Goal Formulation: Patient unable to participate in goal setting    Frequency Min 3X/week   Barriers to discharge        Co-evaluation               End of Session Equipment Utilized During Treatment: Oxygen;Gait belt Activity Tolerance: Patient limited by pain;Treatment limited secondary to agitation Patient left: in bed;with call bell/phone within reach;with family/visitor present   PT Visit Diagnosis: Unsteadiness on feet (R26.81);Repeated falls (R29.6);Muscle weakness (generalized) (M62.81)    Functional Assessment Tool Used: AM-PAC 6 Clicks Basic Mobility;Clinical judgement Functional Limitation: Mobility: Walking and moving around Mobility: Walking and Moving Around Current Status (G5498): At least 20 percent but less than 40 percent impaired, limited or restricted Mobility: Walking and Moving Around Goal Status [phone]): At least 20 percent but less than 40 percent impaired,  limited or restricted    Time: 1600-1619 PT Time Calculation (min) (ACUTE ONLY): 19 min   Charges:   PT Evaluation $PT Eval Moderate Complexity: 1 Procedure PT Treatments $Therapeutic Activity: 8-22 mins   PT G Codes:   PT G-Codes **NOT FOR INPATIENT CLASS** Functional Assessment Tool Used: AM-PAC 6 Clicks Basic Mobility;Clinical judgement Functional Limitation: Mobility: Walking and moving around Mobility: Walking and Moving Around Current Status (A3094): At least 20 percent but less than 40 percent impaired, limited or restricted Mobility: Walking and Moving Around Goal Status [phone]): At least 20 percent but less than 40 percent impaired, limited or restricted    4:44 PM, 06/10/16 Rosamaria Lints, PT, DPT Physical Therapist - [place] [phone] (mobile)

## 2016-06-10 NOTE — Telephone Encounter (Signed)
Patients daughter Claris Che (listed on DPR) called office in reference to lamoTRIgine (LAMICTAL) tablet 100 mg.   Please call daughter in reference to medication she states patient has been on the medication for 1 month.

## 2016-06-10 NOTE — BH Assessment (Signed)
Tele Assessment Note   [name] is an 82 y.o. male who presents to the ED originally due to a fall in his home. While in the ED pt made a suicidal statement to the staff reportedly telling staff that he wanted to "blow his brains out." Pt then became agitated with staff after being told he was now on suicide watch. Pt was then placed on IVC and evaluation was ordered. Pt reports he is not suicidal and asked the assessor "haven't you ever said something that you did not mean out of anger when you were upset?" Pt reports he made the statement because he does not like being in hospitals and has anxiety due to being forced to stay "for a long time" when he comes to hospitals. Pt appeared irritable during the initial part of the assessment, however after several minutes the pt was observed as smiling and engaging in a pleasant demeanor with the assessor. Pt reports that he has several guns at home including "4 or 5 assault rifles, remington 1100, 9 mm pistol, 12 gauge double barrel shotgun, a 44" and multiple other guns in the home. Pt stated "i'm thinking about buying another one here soon." Pt reports he is not suicidal but he was frustrated and has multiple medical issues. Pt reports he recently fell and has been unsteady on his feet for the past several days. Pt reports he is a diabetic and is on a restricted diet. Pt reports he has fallen in the past and had a concussion and was "bleeeding on the brain." Pt reports he sometimes wakes up and gasps for air due to not being able to breathe properly. Pt denies HI and AVH. Pt reports he lives at home with his wife and would like to go back home. Pt endorses some depressive symptoms including insomnia, sporadic irritability and angry moods, and loss of appetite including an inability to eat full meals    Per Nira Conn, NP pt will be seen by psychiatry in the AM. Edwena Bunde, RN notified of the recommended disposition.    Diagnosis: Unspecified  Depressive D/O; Unspecified Anxiety D/O   Past Medical History:  Past Medical History:  Diagnosis Date   Arteriosclerotic cardiovascular disease (ASCVD)    At risk for sudden cardiac death Sep 13, 2012   Atrial fibrillation (HCC)    Onset in 2012   Atrial flutter (HCC)    Bleeding in brain due to brain aneurysm [facility]) October 06 2014   CAD (coronary artery disease)    a. s/p DES to LAD in 1999 b. DES to RCA in 2004 c. NST in 2014 showing scar along LAD territory and borderline ischemia along RCA   CHF (congestive heart failure) (HCC)    Chronic anticoagulation 2012   2012   COPD (chronic obstructive pulmonary disease) (HCC)    DJD (degenerative joint disease)    Gout    Hyperlipidemia    ICD (implantable cardiac defibrillator) in place    Ischemic cardiomyopathy    Kidney stone    "just once" (08/17/2012)   Myocardial infarction 1998   NICM (nonischemic cardiomyopathy), EF 25-30% Sep 13, 2012   Obstructive sleep apnea    "went away when I lost a bunch of weight" (08/17/2012)   Permanent atrial fibrillation (HCC) 02/15/2011   Initial onset in 03/2011 with rapid ventricular response    S/P ICD (internal cardiac defibrillator) procedure, 08/17/12, [employer] September 13, 2012   [employer]   Type II diabetes mellitus (HCC)  Past Surgical History:  Procedure Laterality Date   CARDIAC CATHETERIZATION  02/2003   CARDIAC DEFIBRILLATOR PLACEMENT  08/17/2012   [employer]   CATARACT EXTRACTION W/ INTRAOCULAR LENS  IMPLANT, BILATERAL Bilateral ~ 2011   CHOLECYSTECTOMY  2009   CORONARY ANGIOPLASTY WITH STENT PLACEMENT  07/14/1996   "1" (08/17/2012)   CYSTOSCOPY/RETROGRADE/URETEROSCOPY  06/28/2011   Procedure: CYSTOSCOPY/RETROGRADE/URETEROSCOPY;  Surgeon: Ky Barban, MD;  Location: [facility] ORS;  Service: Urology;  Laterality: Right;   IMPLANTABLE CARDIOVERTER DEFIBRILLATOR IMPLANT N/A 08/17/2012   Procedure: IMPLANTABLE CARDIOVERTER DEFIBRILLATOR IMPLANT;  Surgeon:  Thurmon Fair, MD;  Location: [facility] CATH LAB;  Service: Cardiovascular;  Laterality: N/A;   KNEE ARTHROPLASTY Left 1978   "tendon & cartilege repair" (08/17/2012)   Myoview perfusion scan  06/02/2012   low risk, extensive scar entire LAD & RCA territory   s/p icd  08/2012   [employer]   STONE EXTRACTION WITH BASKET  06/28/2011   Procedure: STONE EXTRACTION WITH BASKET;  Surgeon: Ky Barban, MD;  Location: [facility] ORS;  Service: Urology;  Laterality: Right;  specimen given to family per MD   US ECHOCARDIOGRAPHY  07/08/2012   EF <20%,mild MR,TR,LA severely dilated   VASECTOMY  ~ 1964    Family History:  Family History  Problem Relation Age of Onset   Heart failure Mother    Heart failure Father     Social History:  reports that he quit smoking about 24 years ago. His smoking use included Cigarettes. He has a 80.00 pack-year smoking history. He has quit using smokeless tobacco. He reports that he does not drink alcohol or use drugs.  Additional Social History:  Alcohol / Drug Use Pain Medications: See PTA meds  Prescriptions: See PTA meds  Over the Counter: See PTA meds  History of alcohol / drug use?: Yes Longest period of sobriety (when/how long): 34 years Substance #1 Name of Substance 1: Alcohol 1 - Age of First Use: unknown 1 - Amount (size/oz): unknown 1 - Frequency: unknown 1 - Duration: years 1 - Last Use / Amount: 1984  CIWA: CIWA-Ar BP: 104/71 Pulse Rate: 95 COWS:    PATIENT STRENGTHS: (choose at least two) Average or above average intelligence Capable of independent living Communication skills Financial means General fund of knowledge Supportive family/friends  Allergies:  Allergies  Allergen Reactions   Novocain [Procaine] Nausea And Vomiting and Other (See Comments)    Passes out (also)   Lipitor [Atorvastatin] Other (See Comments)    Myalgias     Procaine Hcl Nausea And Vomiting   Tramadol Itching and Nausea And Vomiting    Home  Medications:  Medications Prior to Admission  Medication Sig Dispense Refill   aspirin EC 81 MG tablet Take 81 mg by mouth daily.     Cholecalciferol (VITAMIN D3) 5000 units CAPS Take 1 capsule by mouth daily.     digoxin (LANOXIN) 0.25 MG tablet Take 1/2 tablet daily (Patient taking differently: Take 0.125 mg by mouth daily. Take 1/2 tablet daily) 30 tablet 11   furosemide (LASIX) 40 MG tablet Take 1.5 tablets in the morning and 1/2 tablet @ 4 (Patient taking differently: Take 20-60 mg by mouth 2 (two) times daily. Take 1.5 tablets in the morning and 1/2 tablet @ 4) 60 tablet 11   isosorbide mononitrate (IMDUR) 30 MG 24 hr tablet Take 0.5 tablets (15 mg total) by mouth at bedtime. 30 tablet 1   LamoTRIgine XR 200 MG TB24 Take one tablet at bedtime. (Patient taking differently:  Take 200 mg by mouth at bedtime. Take one tablet at bedtime.) 90 tablet 1   magnesium oxide (MAG-OX) 400 MG tablet Take 1 tablet (400 mg total) by mouth 2 (two) times daily. 60 tablet 11   metFORMIN (GLUCOPHAGE) 500 MG tablet Take 500 mg by mouth 2 (two) times daily with a meal.      potassium chloride (K-DUR) 10 MEQ tablet Take 1 tablet (10 mEq total) by mouth daily. 30 tablet 11   pravastatin (PRAVACHOL) 40 MG tablet Take 1 tablet (40 mg total) by mouth daily. 30 tablet 11   lamoTRIgine (LAMICTAL) 25 MG tablet 1 tablet twice a day for 7 days, then 2 tablets twice a day for 7 days, then 3 tablets twice a day for 7 days, then Start new prescription for lamotrigine XR 200mg , one tablet at bedtime. (Patient not taking: Reported on 06/28/2016) 84 tablet 0    OB/GYN Status:  No LMP for male patient.  General Assessment Data Location of Assessment: [facility] ED TTS Assessment: In system Is this a Tele or Face-to-Face Assessment?: Tele Assessment Is this an Initial Assessment or a Re-assessment for this encounter?: Initial Assessment Marital status: Married Is patient pregnant?: No Pregnancy Status: No Living  Arrangements: Spouse/significant other Can pt return to current living arrangement?: Yes Admission Status: Involuntary (pt has been IVC'd) Is patient capable of signing voluntary admission?: No Referral Source: Self/Family/Friend Insurance type: Paramedic     Crisis Care Plan Living Arrangements: Spouse/significant other Name of Psychiatrist: none Name of Therapist: none  Education Status Is patient currently in school?: No Highest grade of school patient has completed: 8th  Risk to self with the past 6 months Suicidal Ideation: No Has patient been a risk to self within the past 6 months prior to admission? : No Suicidal Intent: No Has patient had any suicidal intent within the past 6 months prior to admission? : No Is patient at risk for suicide?: No Suicidal Plan?: No Has patient had any suicidal plan within the past 6 months prior to admission? : No Access to Means: No What has been your use of drugs/alcohol within the last 12 months?: denies use  Previous Attempts/Gestures: No Triggers for Past Attempts: None known Intentional Self Injurious Behavior: None Family Suicide History: No Recent stressful life event(s): Turmoil (Comment) (pt fell in his home ) Persecutory voices/beliefs?: No Depression: Yes Depression Symptoms: Feeling angry/irritable Substance abuse history and/or treatment for substance abuse?: No Suicide prevention information given to non-admitted patients: Not applicable  Risk to Others within the past 6 months Homicidal Ideation: No Does patient have any lifetime risk of violence toward others beyond the six months prior to admission? : No Thoughts of Harm to Others: No Current Homicidal Intent: No Current Homicidal Plan: No Access to Homicidal Means: No History of harm to others?: No Assessment of Violence: None Noted Does patient have access to weapons?: Yes (Comment) (pt reports access to more than 8 guns at home) Criminal Charges  Pending?: No Does patient have a court date: No Is patient on probation?: No  Psychosis Hallucinations: None noted Delusions: None noted  Mental Status Report Appearance/Hygiene: In scrubs Eye Contact: Fair Motor Activity: Freedom of movement Speech: Logical/coherent Level of Consciousness: Alert Mood: Irritable, Anxious Affect: Blunted, Anxious Anxiety Level: Minimal Thought Processes: Relevant, Coherent Judgement: Partial Orientation: Person, Time, Place, Situation, Appropriate for developmental age Obsessive Compulsive Thoughts/Behaviors: None  Cognitive Functioning Concentration: Normal Memory: Recent Intact, Remote Intact IQ: Average Insight: Fair Impulse Control: Good  Appetite: Poor Sleep: Decreased Total Hours of Sleep: 5 Vegetative Symptoms: None  ADLScreening [facility] Assessment Services) Patient's cognitive ability adequate to safely complete daily activities?: Yes Patient able to express need for assistance with ADLs?: Yes Independently performs ADLs?: Yes (appropriate for developmental age)  Prior Inpatient Therapy Prior Inpatient Therapy: No  Prior Outpatient Therapy Prior Outpatient Therapy: No Does patient have an ACCT team?: No Does patient have Intensive In-House Services?  : No Does patient have [facility] services? : No Does patient have P4CC services?: No  ADL Screening (condition at time of admission) Patient's cognitive ability adequate to safely complete daily activities?: Yes Is the patient deaf or have difficulty hearing?: No Does the patient have difficulty seeing, even when wearing glasses/contacts?: Yes Does the patient have difficulty concentrating, remembering, or making decisions?: Yes Patient able to express need for assistance with ADLs?: Yes Does the patient have difficulty dressing or bathing?: No Independently performs ADLs?: Yes (appropriate for developmental age) Does the patient have difficulty walking or climbing stairs?:  Yes Weakness of Legs: Both Weakness of Arms/Hands: None  Home Assistive Devices/Equipment Home Assistive Devices/Equipment: Bathtub lift  Therapy Consults (therapy consults require a physician order) PT Evaluation Needed: No OT Evalulation Needed: No SLP Evaluation Needed: No Abuse/Neglect Assessment (Assessment to be complete while patient is alone) Physical Abuse: Denies Verbal Abuse: Denies Sexual Abuse: Denies Exploitation of patient/patient's resources: Denies Self-Neglect: Denies Values / Beliefs Cultural Requests During Hospitalization: None Spiritual Requests During Hospitalization: None Consults Spiritual Care Consult Needed: No Social Work Consult Needed: No Merchant navy officer (For Healthcare) Does Patient Have a Medical Advance Directive?: No Would patient like information on creating a medical advance directive?: No - Patient declined Nutrition Screen- [facility] Patient's home diet: Regular Has the patient recently lost weight without trying?: No Has the patient been eating poorly because of a decreased appetite?: No Malnutrition Screening Tool Score: 0  Additional Information 1:1 In Past 12 Months?: No CIRT Risk: No Elopement Risk: No Does patient have medical clearance?:  (pending)     Disposition:  Disposition Initial Assessment Completed for this Encounter: Yes Disposition of Patient: Other dispositions Other disposition(s): Other (Comment) (AM psych eval per Nira Conn, NP )  Karolee Ohs 06/10/2016 3:43 AM

## 2016-06-11 ENCOUNTER — Ambulatory Visit (HOSPITAL_COMMUNITY): Payer: Medicare HMO

## 2016-06-11 LAB — BASIC METABOLIC PANEL
Anion gap: 17 — ABNORMAL HIGH (ref 5–15)
BUN: 40 mg/dL — ABNORMAL HIGH (ref 6–20)
CALCIUM: 9.9 mg/dL (ref 8.9–10.3)
CHLORIDE: 94 mmol/L — AB (ref 101–111)
CO2: 18 mmol/L — AB (ref 22–32)
CREATININE: 2.24 mg/dL — AB (ref 0.61–1.24)
GFR calc Af Amer: 30 mL/min — ABNORMAL LOW (ref >60)
GFR calc non Af Amer: 26 mL/min — ABNORMAL LOW (ref >60)
GLUCOSE: 109 mg/dL — AB (ref 65–99)
Potassium: 5.8 mmol/L — ABNORMAL HIGH (ref 3.5–5.1)
Sodium: 129 mmol/L — ABNORMAL LOW (ref 135–145)

## 2016-06-11 LAB — GLUCOSE, CAPILLARY
GLUCOSE-CAPILLARY: 158 mg/dL — AB (ref 65–99)
Glucose-Capillary: 125 mg/dL — ABNORMAL HIGH (ref 65–99)
Glucose-Capillary: 163 mg/dL — ABNORMAL HIGH (ref 65–99)
Glucose-Capillary: 97 mg/dL (ref 65–99)

## 2016-06-11 LAB — HEPATIC FUNCTION PANEL
ALK PHOS: 81 U/L (ref 38–126)
ALT: 37 U/L (ref 17–63)
AST: 70 U/L — AB (ref 15–41)
Albumin: 4.1 g/dL (ref 3.5–5.0)
BILIRUBIN DIRECT: 0.6 mg/dL — AB (ref 0.1–0.5)
BILIRUBIN TOTAL: 2.4 mg/dL — AB (ref 0.3–1.2)
Indirect Bilirubin: 1.8 mg/dL — ABNORMAL HIGH (ref 0.3–0.9)
Total Protein: 6.6 g/dL (ref 6.5–8.1)

## 2016-06-11 LAB — MAGNESIUM: MAGNESIUM: 2.4 mg/dL (ref 1.7–2.4)

## 2016-06-11 LAB — AMMONIA: AMMONIA: 67 umol/L — AB (ref 9–35)

## 2016-06-11 LAB — RPR: RPR Ser Ql: NONREACTIVE

## 2016-06-11 MED ORDER — ONDANSETRON HCL 4 MG/2ML IJ SOLN
4.0000 mg | Freq: Four times a day (QID) | INTRAMUSCULAR | Status: DC | PRN
  Administered 2016-06-11 – 2016-06-12 (×2): 4 mg via INTRAVENOUS
  Filled 2016-06-11 (×2): qty 2

## 2016-06-11 MED ORDER — MAGNESIUM HYDROXIDE 400 MG/5ML PO SUSP
30.0000 mL | Freq: Once | ORAL | Status: AC
  Administered 2016-06-11: 30 mL via ORAL
  Filled 2016-06-11: qty 30

## 2016-06-11 MED ORDER — SODIUM CHLORIDE 0.9 % IV SOLN
INTRAVENOUS | Status: DC
Start: 2016-06-11 — End: 2016-06-12
  Administered 2016-06-11 – 2016-06-12 (×2): via INTRAVENOUS

## 2016-06-11 MED ORDER — ONDANSETRON HCL 4 MG PO TABS: 4.0000 mg | ORAL_TABLET | ORAL | Status: DC | PRN

## 2016-06-11 NOTE — Telephone Encounter (Signed)
Dr. Roxy Horseman,   Just wanted to forward you this message regarding a conversation I had with the patient's daughter. Just want to make you aware.

## 2016-06-11 NOTE — Telephone Encounter (Signed)
I called the daughter. She states that her father is in the hospital with renal failure. She insists that this was caused by Lamictal 200 mg. She states that her father was hit with 200 mg after not taking the medication for a month. I asked the daughter if he did the titration of Lamictal as ordered. She states that he did not-- he begin taking the 200 mg tablet. I advised that  the patient had been told by Dr. Zannie Cove nurse Marcelino Duster to do the titration and I again advised this with the patient in the office visit on February 19 when she and his wife was present. She again insists that it was the Lamictal that is causing his renal failure. I again advised the daughter that I reviewed the hospital notes and Dr. Janna Arch has continued Lamictal in the hospital setting. She then advised that if her dad did not make it then she would be "hot." I advised that I would inform Dr. Terrace Arabia and we would be in touch.

## 2016-06-11 NOTE — Care Management Note (Signed)
Case Management Note  Patient Details  Name: [name] MRN: [id number] Date of Birth: 1935-02-17  Subjective/Objective:   Patient adm from home falls/hyponatremia. He is from home with wife, recommended for [facility] PT. He declines Home health, but is agreeable to OP PT. Wife and daughter present at bedside. Daughter states that if patient is unable to drive, his other daughter can take him to OP PT.               Action/Plan: CM will send electronic referral to [facility] PT.   Expected Discharge Date:     06/11/2016            Expected Discharge Plan:  Home/Self Care  In-House Referral:     Discharge planning Services  CM Consult  Post Acute Care Choice:    Choice offered to:     DME Arranged:  Patient refused services DME Agency:     HH Arranged:    HH Agency:     Status of Service:  In process, will continue to follow  If discussed at Long Length of Stay Meetings, dates discussed:    Additional Comments:  Tecora Eustache, Chrystine Oiler, RN 06/11/2016, 11:16 AM

## 2016-06-11 NOTE — Progress Notes (Signed)
[name] [name] QZE:[id number] DOB: 1934-05-28 DOA: 06/07/2016 PCP: [name] Stalling, MD Echo reveals worsened systolic function EF 15-20% patient has acute renal failure with elevated potassium 5.8 will discontinue potassium hyponatremia persists we'll change to 0.9 normal saline at 75 mL an hour monitor sodium and be met daily patient not on anticoagulation due to falls and intracerebral bleeds we'll obtain cardiology consultation consider cardiorenal syndrome  Physical Exam: Blood pressure 106/84, pulse 99, temperature 97.6 F (36.4 C), temperature source Oral, resp. rate 16, height 5\' 4"  (1.626 [name]), weight 69.2 kg (152 lb 8.9 oz), SpO2 95 %. Lungs diminished breath sounds in the bases prolonged x-ray phase scattered rhonchi no rales audible. Heart irregular regular no S3 no heave thrills rubs extremities SCDs in place with chronic pedal edema   Investigations:  No results found for this or any previous visit (from the past 240 hour(s)).   Basic Metabolic Panel:  Recent Labs  22/63/33 0455 06/11/16 0511  NA 131* 129*  K 4.4 5.8*  CL 94* 94*  CO2 24 18*  GLUCOSE 133* 109*  BUN 30* 40*  CREATININE 1.65* 2.24*  CALCIUM 9.8 9.9   Liver Function Tests:  Recent Labs  06/21/2016 2111 06/11/16 0511  AST 25 70*  ALT 16* 37  ALKPHOS 69 81  BILITOT 1.8* 2.4*  PROT 6.5 6.6  ALBUMIN 4.0 4.1     CBC:  Recent Labs  06/29/2016 2111 06/10/16 1412  WBC 10.4 10.8*  NEUTROABS 7.2  --   HGB 15.8 15.7  HCT 46.5 47.0  MCV 100.2* 99.8  PLT 152 153    Dg Chest 2 View  Result Date: 06/26/2016 CLINICAL DATA:  Lost balance and fell 2 days ago, striking his head on wall. Bilateral lower extremity pitting edema. Dyspnea for several months. EXAM: CHEST  2 VIEW COMPARISON:  04/30/2016 FINDINGS: Stable cardiomegaly. The lungs are clear. Possible trace right pleural effusion. Pulmonary vasculature is normal. The transvenous cardiac lead appears grossly intact. IMPRESSION: Stable cardiomegaly.  Possible trace right pleural effusion. No interstitial or alveolar edema. Electronically Signed   By: [name] [name].D.   On: 06/24/2016 21:55   Ct Head Wo Contrast  Result Date: 06/30/2016 CLINICAL DATA:  Fall with head injury.  Initial encounter. EXAM: CT HEAD WITHOUT CONTRAST CT CERVICAL SPINE WITHOUT CONTRAST TECHNIQUE: Multidetector CT imaging of the head and cervical spine was performed following the standard protocol without intravenous contrast. Multiplanar CT image reconstructions of the cervical spine were also generated. COMPARISON:  07/12/2015 FINDINGS: CT HEAD FINDINGS Brain: No evidence of acute infarction, hemorrhage, hydrocephalus, or mass lesion. Variable extra-axial space thickness around the frontal lobes is stable from 2017. No definite extra-axial collection. Generalized atrophy, stable. Vascular: Atherosclerotic calcification. Skull: Negative for fracture. Sinuses/Orbits: Bilateral cataract resection.  No acute finding CT CERVICAL SPINE FINDINGS Alignment: Normal. Skull base and vertebrae: No acute fracture. No primary bone lesion or focal pathologic process. Soft tissues and spinal canal: No prevertebral fluid or swelling. No visible canal hematoma. Disc levels: Mild generalized disc narrowing and annulus calcification. No identified impingement. Upper chest: No acute finding IMPRESSION: No evidence of intracranial or cervical spine injury. Electronically Signed   By: [name] [place] [name].D.   On: 07/04/2016 21:53   Ct Cervical Spine Wo Contrast  Result Date: 06/28/2016 CLINICAL DATA:  Fall with head injury.  Initial encounter. EXAM: CT HEAD WITHOUT CONTRAST CT CERVICAL SPINE WITHOUT CONTRAST TECHNIQUE: Multidetector CT imaging of the head and cervical spine was performed following the standard protocol  without intravenous contrast. Multiplanar CT image reconstructions of the cervical spine were also generated. COMPARISON:  07/12/2015 FINDINGS: CT HEAD FINDINGS Brain: No evidence of  acute infarction, hemorrhage, hydrocephalus, or mass lesion. Variable extra-axial space thickness around the frontal lobes is stable from 2017. No definite extra-axial collection. Generalized atrophy, stable. Vascular: Atherosclerotic calcification. Skull: Negative for fracture. Sinuses/Orbits: Bilateral cataract resection.  No acute finding CT CERVICAL SPINE FINDINGS Alignment: Normal. Skull base and vertebrae: No acute fracture. No primary bone lesion or focal pathologic process. Soft tissues and spinal canal: No prevertebral fluid or swelling. No visible canal hematoma. Disc levels: Mild generalized disc narrowing and annulus calcification. No identified impingement. Upper chest: No acute finding IMPRESSION: No evidence of intracranial or cervical spine injury. Electronically Signed   By: [name] Fantasia [name].D.   On: 06/25/2016 21:53      Medications:  Impression:  Principal Problem:   Adjustment disorder with mixed anxiety and depressed mood Active Problems:   Permanent atrial fibrillation ([place])   Fall   S/P ICD (internal cardiac defibrillator) procedure, 08/17/12, [employer] implanted   Diabetes type 2, uncontrolled ([place])   HLD (hyperlipidemia)   Systolic CHF, chronic ([place])   Hyponatremia   Suicidal ideation     Plan: Hold potassium. 0.9 normal saline at 75 mL an hour monitor sodium and potassium daily and to new Lasix 40 mg IV daily continue SCDs. Cardiology consultation regarding worsening systolic congestive heart failure as well as impaired renal perfusion and acute renal failure  Consultants: Cardiology requested    Procedures   Antibiotics:           Time spent: 30 minutes   LOS: 2 days   [name] [name]   06/11/2016, 12:21 PM

## 2016-06-12 ENCOUNTER — Inpatient Hospital Stay (HOSPITAL_COMMUNITY): Payer: Medicare HMO

## 2016-06-12 DIAGNOSIS — I5022 Chronic systolic (congestive) heart failure: Secondary | ICD-10-CM

## 2016-06-12 DIAGNOSIS — G9340 Encephalopathy, unspecified: Secondary | ICD-10-CM

## 2016-06-12 DIAGNOSIS — R7989 Other specified abnormal findings of blood chemistry: Secondary | ICD-10-CM

## 2016-06-12 LAB — AMMONIA: Ammonia: 45 umol/L — ABNORMAL HIGH (ref 9–35)

## 2016-06-12 LAB — HEPATIC FUNCTION PANEL
ALK PHOS: 97 U/L (ref 38–126)
ALT: 1131 U/L — AB (ref 17–63)
ALT: 1803 U/L — ABNORMAL HIGH (ref 17–63)
AST: 3202 U/L — ABNORMAL HIGH (ref 15–41)
AST: 4975 U/L — AB (ref 15–41)
Albumin: 4.4 g/dL (ref 3.5–5.0)
Albumin: 4.3 g/dL (ref 3.5–5.0)
Alkaline Phosphatase: 99 U/L (ref 38–126)
BILIRUBIN DIRECT: 1.8 mg/dL — AB (ref 0.1–0.5)
BILIRUBIN INDIRECT: 2.2 mg/dL — AB (ref 0.3–0.9)
BILIRUBIN TOTAL: 4.1 mg/dL — AB (ref 0.3–1.2)
BILIRUBIN TOTAL: 3.8 mg/dL — AB (ref 0.3–1.2)
Bilirubin, Direct: 1.9 mg/dL — ABNORMAL HIGH (ref 0.1–0.5)
Indirect Bilirubin: 2 mg/dL — ABNORMAL HIGH (ref 0.3–0.9)
Total Protein: 7.1 g/dL (ref 6.5–8.1)
Total Protein: 6.9 g/dL (ref 6.5–8.1)

## 2016-06-12 LAB — BLOOD GAS, ARTERIAL
Acid-base deficit: 10.8 mmol/L — ABNORMAL HIGH (ref 0.0–2.0)
Bicarbonate: 16.7 mmol/L — ABNORMAL LOW (ref 20.0–28.0)
Drawn by: 23534
O2 Content: 5 L/min
O2 Saturation: 95.1 %
pCO2 arterial: 25.8 mmHg — ABNORMAL LOW (ref 32.0–48.0)
pH, Arterial: 7.347 — ABNORMAL LOW (ref 7.350–7.450)
pO2, Arterial: 90 mmHg (ref 83.0–108.0)

## 2016-06-12 LAB — CK: Total CK: 38 U/L — ABNORMAL LOW (ref 49–397)

## 2016-06-12 LAB — GLUCOSE, CAPILLARY
GLUCOSE-CAPILLARY: 44 mg/dL — AB (ref 65–99)
GLUCOSE-CAPILLARY: 81 mg/dL (ref 65–99)
GLUCOSE-CAPILLARY: 140 mg/dL — AB (ref 65–99)
Glucose-Capillary: 131 mg/dL — ABNORMAL HIGH (ref 65–99)
Glucose-Capillary: 151 mg/dL — ABNORMAL HIGH (ref 65–99)

## 2016-06-12 LAB — BASIC METABOLIC PANEL
BUN: 51 mg/dL — ABNORMAL HIGH (ref 6–20)
CHLORIDE: 89 mmol/L — AB (ref 101–111)
CO2: 13 mmol/L — AB (ref 22–32)
Calcium: 10.2 mg/dL (ref 8.9–10.3)
Creatinine, Ser: 3.41 mg/dL — ABNORMAL HIGH (ref 0.61–1.24)
GFR calc non Af Amer: 16 mL/min — ABNORMAL LOW (ref >60)
GFR, EST AFRICAN AMERICAN: 18 mL/min — AB (ref >60)
GLUCOSE: 48 mg/dL — AB (ref 65–99)
Potassium: 5.6 mmol/L — ABNORMAL HIGH (ref 3.5–5.1)
SODIUM: 131 mmol/L — AB (ref 135–145)

## 2016-06-12 LAB — BRAIN NATRIURETIC PEPTIDE: B Natriuretic Peptide: 2370 pg/mL — ABNORMAL HIGH (ref 0.0–100.0)

## 2016-06-12 LAB — SODIUM, URINE, RANDOM: Sodium, Ur: <10 mmol/L

## 2016-06-12 LAB — TROPONIN I: TROPONIN I: 0.38 ng/mL — AB (ref <0.03)

## 2016-06-12 LAB — CREATININE, URINE, RANDOM: Creatinine, Urine: 95.04 mg/dL

## 2016-06-12 LAB — ACETAMINOPHEN LEVEL: Acetaminophen (Tylenol), Serum: <10 ug/mL — ABNORMAL LOW (ref 10–30)

## 2016-06-12 LAB — MRSA PCR SCREENING: MRSA BY PCR: NEGATIVE

## 2016-06-12 MED ORDER — DEXTROSE 50 % IV SOLN
INTRAVENOUS | Status: AC
  Administered 2016-06-12: 50 mL
  Filled 2016-06-12: qty 50

## 2016-06-12 MED ORDER — SODIUM CHLORIDE 0.45 % IV SOLN
INTRAVENOUS | Status: DC
  Administered 2016-06-12 – 2016-06-13 (×2): via INTRAVENOUS

## 2016-06-12 MED ORDER — DOBUTAMINE IN D5W 4-5 MG/ML-% IV SOLN
1.0000 ug/kg/min | INTRAVENOUS | Status: DC
  Administered 2016-06-12: 2.5 ug/kg/min via INTRAVENOUS
  Administered 2016-06-15: 2 ug/kg/min via INTRAVENOUS
  Filled 2016-06-12 (×2): qty 250

## 2016-06-12 NOTE — Progress Notes (Signed)
   Patient coming from [facility] from Dr. Otilio Saber service. Patient initially admitted for hyponatremia but has subsequently developed worsening renal failure and transaminitis likely from cardiorenal and cardio hepatic congestion from low cardiac output. Patient with an EF of 15%. Mildly elevated but stable troponins noted. Cardiology, Dr Wyline Mood, and Dr. Janna Arch feel patient would do well to have formal evaluation by heart failure team at [facility] and to be started on dobutamine. CHF team will need to be notified after patient arrives. Pt accepted to [facility] under a inpatient status and to a SDU bed.   Shelly Flatten, MD [facility] 06/12/2016, 4:16 PM

## 2016-06-12 NOTE — Consult Note (Signed)
Referring Provider: Debbe Mounts, MD  Primary Care Physician:  [name], MD Primary Gastroenterologist:  Dr. Karilyn Cota  Reason for Consultation:    Elevated transaminases.  HPI:   History obtained primarily from patient's wife was at bedside and patient's chart.  Patient is 82 year old Caucasian male was multiple medical problems including ischemic cardiomyopathy who fell home on 06/07/2016. According to patient's wife he has not felt well since then. He's been complaining of feeling dizzy and complained of headache. He began to have nausea and vomiting a day later. Patient was brought to emergency room for evaluation on 06/14/2016. He was noted to be hyponatremic with serum sodium 129. BUN and creatinine were also elevated at 28 and 1.59 respectively. Patient's bilirubin was 1.8 AST and ALT were 25 and 16 with albumin of 4.0. Unenhanced head CT was negative for acute problems. He also had cervical spine CT revealing no acute abnormality. Chest film showed stable cardiomegaly and trace right pleural effusion but no evidence of pulmonary edema. While in emergency room patient stated that he wanted to lower his brains out. He became agitated. Therefore telepsych consultation was obtained. He was felt to have adjustment disorder with anxiety and depressed mood. Patient was admitted to ICU. Patient was noted to have worsening renal function and nephrology consultation was obtained. He underwent echocardiography on 06/10/2016 and noted to have diffuse hypokinesis with EF in the range of 15-20%. Both atria were moderately dilated. Right ventricle was also dilated with reduced systolic function. Cardiology consultation was obtained because of worsening systolic function. Patient was evaluated by Dr. Christiane Ha branch and begun on dobutamine. This morning patient was noted to have AST and ALT of 06/07/2000 and 1131 respectively and his bilirubin was 4.1. Therefore GI consultation was  requested. Patient planes of pain across upper abdomen. There is no history of hematemesis or melena. There is no history of chronic liver disease or prior jaundice. Patient takes 2 Tylenol tablets every day. Level on admission was less than 10.   Past Medical History:  Diagnosis Date  . Arteriosclerotic cardiovascular disease (ASCVD)   . At risk for sudden cardiac death 08/24/2012  . Atrial fibrillation (HCC)    Onset in 2012      . Bleeding in brain due to brain aneurysm [facility]) October 06 2014  . CAD (coronary artery disease)    a. s/p DES to LAD in 1999 b. DES to RCA in 2004 c. NST in 2014 showing scar along LAD territory and borderline ischemia along RCA  . CHF (congestive heart failure) (HCC)   .       Marland Kitchen COPD (chronic obstructive pulmonary disease) (HCC)   . DJD (degenerative joint disease)   . Gout   . Hyperlipidemia   . ICD (implantable cardiac defibrillator) in place   . Ischemic cardiomyopathy   . Kidney stone    "just once" (08/17/2012)  . Myocardial infarction 1998  . NICM (nonischemic cardiomyopathy), EF 25-30% 08/24/2012  . Obstructive sleep apnea    "went away when I lost a bunch of weight" (08/17/2012)    02/15/2011   Initial onset in 03/2011 with rapid ventricular response   . S/P ICD (internal cardiac defibrillator) procedure, 08/17/12, [employer] Aug 24, 2012   [employer]  . Type II diabetes mellitus (HCC)     Past Surgical History:  Procedure Laterality Date  . CARDIAC CATHETERIZATION  02/2003  . CARDIAC DEFIBRILLATOR PLACEMENT  08/17/2012   [employer]  . CATARACT EXTRACTION W/ INTRAOCULAR LENS  IMPLANT, BILATERAL  Bilateral ~ 2011  . CHOLECYSTECTOMY  2009  . CORONARY ANGIOPLASTY WITH STENT PLACEMENT  07/14/1996   "1" (08/17/2012)  . CYSTOSCOPY/RETROGRADE/URETEROSCOPY  06/28/2011   Procedure: CYSTOSCOPY/RETROGRADE/URETEROSCOPY;  Surgeon: Ky Barban, MD;  Location: [facility] ORS;  Service: Urology;  Laterality: Right;  . IMPLANTABLE CARDIOVERTER  DEFIBRILLATOR IMPLANT N/A 08/17/2012   Procedure: IMPLANTABLE CARDIOVERTER DEFIBRILLATOR IMPLANT;  Surgeon: Thurmon Fair, MD;  Location: [facility] CATH LAB;  Service: Cardiovascular;  Laterality: N/A;  . KNEE ARTHROPLASTY Left 1978   "tendon & cartilege repair" (08/17/2012)  . Myoview perfusion scan  06/02/2012   low risk, extensive scar entire LAD & RCA territory  . s/p icd  08/2012   [employer]  . STONE EXTRACTION WITH BASKET  06/28/2011   Procedure: STONE EXTRACTION WITH BASKET;  Surgeon: Ky Barban, MD;  Location: [facility] ORS;  Service: Urology;  Laterality: Right;  specimen given to family per MD  . US ECHOCARDIOGRAPHY  07/08/2012   EF <20%,mild MR,TR,LA severely dilated  . VASECTOMY  ~ 1964    Prior to Admission medications   Medication Sig Start Date End Date Taking? Authorizing Provider  aspirin EC 81 MG tablet Take 81 mg by mouth daily.   Yes Historical Provider, MD  Cholecalciferol (VITAMIN D3) 5000 units CAPS Take 1 capsule by mouth daily.   Yes Historical Provider, MD  digoxin (LANOXIN) 0.25 MG tablet Take 1/2 tablet daily Patient taking differently: Take 0.125 mg by mouth daily. Take 1/2 tablet daily 05/23/16  Yes Lennette Bihari, MD  furosemide (LASIX) 40 MG tablet Take 1.5 tablets in the morning and 1/2 tablet @ 4 Patient taking differently: Take 20-60 mg by mouth 2 (two) times daily. Take 1.5 tablets in the morning and 1/2 tablet @ 4 05/23/16  Yes Lennette Bihari, MD  isosorbide mononitrate (IMDUR) 30 MG 24 hr tablet Take 0.5 tablets (15 mg total) by mouth at bedtime. 05/23/16  Yes Lennette Bihari, MD  LamoTRIgine XR 200 MG TB24 Take one tablet at bedtime. Patient taking differently: Take 200 mg by mouth at bedtime. Take one tablet at bedtime. 05/29/16  Yes Butch Penny, NP  magnesium oxide (MAG-OX) 400 MG tablet Take 1 tablet (400 mg total) by mouth 2 (two) times daily. 01/15/16  Yes Mihai Croitoru, MD  metFORMIN (GLUCOPHAGE) 500 MG tablet Take 500 mg by mouth 2 (two) times daily  with a meal.    Yes Historical Provider, MD  potassium chloride (K-DUR) 10 MEQ tablet Take 1 tablet (10 mEq total) by mouth daily. 01/15/16  Yes Mihai Croitoru, MD  pravastatin (PRAVACHOL) 40 MG tablet Take 1 tablet (40 mg total) by mouth daily. 01/15/16  Yes Mihai Croitoru, MD  lamoTRIgine (LAMICTAL) 25 MG tablet 1 tablet twice a day for 7 days, then 2 tablets twice a day for 7 days, then 3 tablets twice a day for 7 days, then Start new prescription for lamotrigine XR 200mg , one tablet at bedtime. Patient not taking: Reported on 06/16/2016 05/29/16   Butch Penny, NP    Current Facility-Administered Medications  Medication Dose Route Frequency Provider Last Rate Last Dose  . 0.9 %  sodium chloride infusion   Intravenous Continuous Oval Linsey, MD 75 mL/hr at 06/12/16 0028    . aspirin EC tablet 81 mg  81 mg Oral Daily Houston Siren, MD   81 mg at 06/10/16 0845  . cholecalciferol (VITAMIN D) tablet 5,000 Units  5,000 Units Oral Daily Houston Siren, MD   5,000 Units at 06/10/16 [phone]  .  DOBUTamine (DOBUTREX) infusion 4000 mcg/mL  2 mcg/kg/min Intravenous Titrated Antoine Poche, MD 2.6 mL/hr at 06/12/16 1406 2.5 mcg/kg/min at 06/12/16 1406  . heparin injection 5,000 Units  5,000 Units Subcutaneous Q8H Houston Siren, MD   5,000 Units at 06/12/16 1410  . HYDROcodone-acetaminophen (NORCO/VICODIN) 5-325 MG per tablet 1 tablet  1 tablet Oral Q6H PRN Carylon Perches, MD   1 tablet at 06/10/16 1817  . insulin aspart (novoLOG) injection 0-5 Units  0-5 Units Subcutaneous QHS Houston Siren, MD      . insulin aspart (novoLOG) injection 0-9 Units  0-9 Units Subcutaneous TID WC Houston Siren, MD   2 Units at 06/11/16 1224  . ipratropium-albuterol (DUONEB) 0.5-2.5 (3) MG/3ML nebulizer solution 3 mL  3 mL Nebulization TID PRN Oval Linsey, MD   3 mL at 06/10/16 2104  . isosorbide mononitrate (IMDUR) 24 hr tablet 15 mg  15 mg Oral QHS Houston Siren, MD   15 mg at 06/10/16 2157  . magnesium oxide (MAG-OX) tablet 400 mg  400 mg Oral BID  Oval Linsey, MD   400 mg at 06/10/16 2157  . MUSCLE RUB CREA   Topical PRN Oval Linsey, MD      . ondansetron [facility]) tablet 4 mg  4 mg Oral Q4H PRN Oval Linsey, MD       Or  . ondansetron [facility]) injection 4 mg  4 mg Intravenous Q6H PRN Oval Linsey, MD   4 mg at 06/12/16 0755  . sodium chloride flush (NS) 0.9 % injection 3 mL  3 mL Intravenous Q12H Houston Siren, MD   3 mL at 06/12/16 1414  . traMADol (ULTRAM) tablet 50 mg  50 mg Oral TID PRN Oval Linsey, MD   50 mg at 06/10/16 2157    Allergies as of 06/13/2016 - Review Complete 07/02/2016  Allergen Reaction Noted  . Novocain [procaine] Nausea And Vomiting and Other (See Comments) 04/30/2016  . Lipitor [atorvastatin] Other (See Comments) 06/02/2012  . Procaine hcl Nausea And Vomiting 02/15/2011  . Tramadol Itching and Nausea And Vomiting 03/06/2012    Family History  Problem Relation Age of Onset  . Heart failure Mother   . Heart failure Father     Social History   Social History  . Marital status: Married    Spouse name: N/A  . Number of children: 3  . Years of education: 9th   Occupational History  . Retired    Social History Main Topics  . Smoking status: Former Smoker    Packs/day: 2.00    Years: 40.00    Types: Cigarettes    Quit date: 04/08/1992  . Smokeless tobacco: Former Neurosurgeon  . Alcohol use No     Comment: 08/17/2012 "quit drinking in 1983"  . Drug use: No  . Sexual activity: No   Other Topics Concern  . Not on file   Social History Narrative   Lives at home with his wife.   Right-handed.   No caffeine use.    Review of Systems: See HPI, otherwise normal ROS  Physical Exam: Temp:  [97.5 F (36.4 C)-98.5 F (36.9 C)] 97.5 F (36.4 C) (03/07 1352) Pulse Rate:  [74-110] 110 (03/07 1400) Resp:  [18-19] 19 (03/07 1400) BP: (102-115)/(62-78) 102/76 (03/07 1400) SpO2:  [96 %-97 %] 97 % (03/07 1400) Weight:  [161 lb 13.1 oz (73.4 kg)] 161 lb 13.1 oz (73.4 kg) (03/07 1352) Last  BM Date: 06/11/16 Patient is drowsy but responds appropriately to questions. Patient appears to  have asterixis. Conjunctiva is pink. Sclera is icteric. Oropharyngeal mucosa is dry. JVD is elevated. Cardiac exam with regular rhythm somewhat distant but normal S1 and S2. No murmur or gallop noted. Lungs are clear to auscultation. Abdomen is full. Bowel sounds are normal. No bruits noted. On palpation abdomen is soft. Left lobe of the liver is palpable. Soft and nonpulsatile. Right lobe of the liver is indistinct. Trace edema noted around ankles.     Lab Results:  Recent Labs  06/07/2016 2111 06/10/16 1412  WBC 10.4 10.8*  HGB 15.8 15.7  HCT 46.5 47.0  PLT 152 153   BMET  Recent Labs  06/10/16 0455 06/11/16 0511 06/12/16 0543  NA 131* 129* 131*  K 4.4 5.8* 5.6*  CL 94* 94* 89*  CO2 24 18* 13*  GLUCOSE 133* 109* 48*  BUN 30* 40* 51*  CREATININE 1.65* 2.24* 3.41*  CALCIUM 9.8 9.9 10.2   LFT  Recent Labs  06/12/16 1342  PROT 6.9  ALBUMIN 4.3  AST 4,975*  ALT 1,803*  ALKPHOS 99  BILITOT 3.8*  BILIDIR 1.8*  IBILI 2.0*   Serum ammonia was 67 yesterday and 45 today.  CT images from September 2015 study reviewed. It shows atherosclerotic changes to & takeoff of both celiac trunk and SMA.  Assessment;  Patient has acute hepatocellular injury. Transaminases were normal on admission and have increased several fold. Acetaminophen level on admission and subsequently less than 10. Hepatocellular injury/jaundice appears to be vascular injury. Suspect he has both congestion as well as ischemic injury given hypotension in the background of disease to celiac trunk and SMA. He could also have hepatic or portal vein thrombosis. Doubt that he has acute viral hepatitis but this will be ruled out by appropriate tests. Will also rule out acute bile duct obstruction which began resultant precipitate was rice and transaminases. This again is highly unlikely.  Worsening renal  failure would appear to be most likely due to worsening cardiac function.  Recommendations;  Will check serum INR and viral markers for acute hepatitis. Will proceed with right upper quadrant abdominal ultrasound and Doppler study to evaluate portal and hepatic veins. Optimize therapy for CHF as you are doing.    LOS: 3 days   Laurielle Selmon  06/12/2016, 3:54 PM

## 2016-06-12 NOTE — Progress Notes (Signed)
Pharmacist Heart Failure Core Measure Documentation  Assessment: [name] has an EF documented as 15-20 on echo by 06/10/16.  Rationale: Heart failure patients with left ventricular systolic dysfunction (LVSD) and an EF < 40% should be prescribed an angiotensin converting enzyme inhibitor (ACEI) or angiotensin receptor blocker (ARB) at discharge unless a contraindication is documented in the medical record.  This patient is not currently on an ACEI or ARB for HF.  This note is being placed in the record in order to provide documentation that a contraindication to the use of these agents is present for this encounter.  ACE Inhibitor or Angiotensin Receptor Blocker is contraindicated (specify all that apply)  []   ACEI allergy AND ARB allergy []   Angioedema []   Moderate or severe aortic stenosis []   Hyperkalemia []   Hypotension []   Renal artery stenosis [x]   Worsening renal function, preexisting renal disease or dysfunction   [name] A 06/12/2016 12:55 PM

## 2016-06-12 NOTE — Progress Notes (Signed)
Patient being transferred to ICU- 06, report called and given to [facility]. Family at bedside. Staff accompanied patient to awaiting floor.

## 2016-06-12 NOTE — Progress Notes (Addendum)
Patient transferred to ICU, appears GI and renal have been consulted. At this time it is not totally clear his clinical presentation is due to low output heart failure to me. His LVEF is essentially stable by echo. His AST went to 3000 and ALT to 1000 along with Cr to 3 in essentially 24 hrs. If acute a drop of cardiac output as cause would have expected significant hypotension or signs of severe overt progrssing heart failure which have not been present. Primary team has started dobutamine, at this time I think is reasonable as we gather more data, keep at low dose. . My recommendation is transfer to [facility] to medicine service, I will contact CHF service to evaluate patient, consider RHC to allow a definitive diagnosis of possible low CO CHF. I have discussed with Dr Janna Arch who will arrange transfer to medicine team.    Dominga Ferry MD

## 2016-06-12 NOTE — Consult Note (Addendum)
SHEEHAN BOUTILIER MRN: [id number] DOB/AGE: 10-21-1934 82 y.o. Primary Care Physician:DONDIEGO,[name] M, MD Admit date: 2016/06/23 Chief Complaint:  Chief Complaint  Patient presents with  . Head Injury    fall Friday  . Leg Swelling  . Medical Clearance   HPI: Pt is 82 year old male with past medical hx of CAD, CHF who came to ER with c/o falls.  HPI dates back to March 3/418 when pt came to er with c/o fall and head injury.  Upon evalation in ER pt c/o having suicidal ideation. Pt was also noticed to have hypervolemic hyponatremia and was started on IV lasix and Pt was admitted for further tx.Pt later was noticed to have AKI and nephrology was consulted. Pt seen today. Pt voices no new concerns. Pt is lethargic but arousable and answers direct questions. NO c/o chest pain/dyspnea No c/o fever/cough/chills NO hematuria NO c/o abdominal pain  Pt family mentioned decreased urine output from past few days They also mentioned decreased po intake and lethargy.   Past Medical History:  Diagnosis Date  . Arteriosclerotic cardiovascular disease (ASCVD)   . At risk for sudden cardiac death 09/01/2012  . Atrial fibrillation (HCC)    Onset in 2012  . Atrial flutter (HCC)   . Bleeding in brain due to brain aneurysm [facility]) October 06 2014  . CAD (coronary artery disease)    a. s/p DES to LAD in 1999 b. DES to RCA in 2004 c. NST in 2014 showing scar along LAD territory and borderline ischemia along RCA  . CHF (congestive heart failure) (HCC)   . Chronic anticoagulation 2012   2012  . COPD (chronic obstructive pulmonary disease) (HCC)   . DJD (degenerative joint disease)   . Gout   . Hyperlipidemia   . ICD (implantable cardiac defibrillator) in place   . Ischemic cardiomyopathy   . Kidney stone    "just once" (08/17/2012)  . Myocardial infarction 1998  . NICM (nonischemic cardiomyopathy), EF 25-30% 09/01/12  . Obstructive sleep apnea    "went away when I lost a bunch of weight" (08/17/2012)   . Permanent atrial fibrillation (HCC) 02/15/2011   Initial onset in 03/2011 with rapid ventricular response   . S/P ICD (internal cardiac defibrillator) procedure, 08/17/12, [employer] Sep 01, 2012   [employer]  . Type II diabetes mellitus (HCC)         Family History  Problem Relation Age of Onset  . Heart failure Mother   . Heart failure Father     Social History:  reports that he quit smoking about 24 years ago. His smoking use included Cigarettes. He has a 80.00 pack-year smoking history. He has quit using smokeless tobacco. He reports that he does not drink alcohol or use drugs.   Allergies:  Allergies  Allergen Reactions  . Novocain [Procaine] Nausea And Vomiting and Other (See Comments)    Passes out (also)  . Lipitor [Atorvastatin] Other (See Comments)    Myalgias    . Procaine Hcl Nausea And Vomiting  . Tramadol Itching and Nausea And Vomiting    Medications Prior to Admission  Medication Sig Dispense Refill  . aspirin EC 81 MG tablet Take 81 mg by mouth daily.    . Cholecalciferol (VITAMIN D3) 5000 units CAPS Take 1 capsule by mouth daily.    . digoxin (LANOXIN) 0.25 MG tablet Take 1/2 tablet daily (Patient taking differently: Take 0.125 mg by mouth daily. Take 1/2 tablet daily) 30 tablet 11  . furosemide (LASIX)  40 MG tablet Take 1.5 tablets in the morning and 1/2 tablet @ 4 (Patient taking differently: Take 20-60 mg by mouth 2 (two) times daily. Take 1.5 tablets in the morning and 1/2 tablet @ 4) 60 tablet 11  . isosorbide mononitrate (IMDUR) 30 MG 24 hr tablet Take 0.5 tablets (15 mg total) by mouth at bedtime. 30 tablet 1  . LamoTRIgine XR 200 MG TB24 Take one tablet at bedtime. (Patient taking differently: Take 200 mg by mouth at bedtime. Take one tablet at bedtime.) 90 tablet 1  . magnesium oxide (MAG-OX) 400 MG tablet Take 1 tablet (400 mg total) by mouth 2 (two) times daily. 60 tablet 11  . metFORMIN (GLUCOPHAGE) 500 MG tablet Take 500 mg by  mouth 2 (two) times daily with a meal.     . potassium chloride (K-DUR) 10 MEQ tablet Take 1 tablet (10 mEq total) by mouth daily. 30 tablet 11  . pravastatin (PRAVACHOL) 40 MG tablet Take 1 tablet (40 mg total) by mouth daily. 30 tablet 11  . lamoTRIgine (LAMICTAL) 25 MG tablet 1 tablet twice a day for 7 days, then 2 tablets twice a day for 7 days, then 3 tablets twice a day for 7 days, then Start new prescription for lamotrigine XR 200mg , one tablet at bedtime. (Patient not taking: Reported on 06/24/2016) 84 tablet 0      inpt meds  . aspirin EC  81 mg Oral Daily  . cholecalciferol  5,000 Units Oral Daily  . heparin  5,000 Units Subcutaneous Q8H  . insulin aspart  0-5 Units Subcutaneous QHS  . insulin aspart  0-9 Units Subcutaneous TID WC  . isosorbide mononitrate  15 mg Oral QHS  . magnesium oxide  400 mg Oral BID  . sodium chloride flush  3 mL Intravenous Q12H         WUJ:WJXBJ from the symptoms mentioned above,there are no other symptoms referable to all systems reviewed.  Physical Exam: Vital signs in last 24 hours: Temp:  [98.1 F (36.7 C)-98.5 F (36.9 C)] 98.4 F (36.9 C) (03/07 0500) Pulse Rate:  [74-97] 74 (03/07 0500) Resp:  [18] 18 (03/07 0500) BP: (109-115)/(62-81) 109/62 (03/07 0500) SpO2:  [96 %-97 %] 96 % (03/07 0500) Weight change:  Last BM Date: 06/30/2016  Intake/Output from previous day: 03/06 0701 - 03/07 0700 In: 877.5 [P.O.:420; I.V.:457.5] Out: 200 [Urine:200] Total I/O In: 120 [P.O.:120] Out: -    Physical Exam: General- pt is lethargic but arousable, following commands intermittently. Resp- No acute REsp distress, minimal Rhonchi. CVS- S1S2 irregular in rate and rhythm. GIT- BS+, soft, NT, ND. EXT- NO LE Edema, NO Cyanosis CNS- CN 2-12 grossly intact.    Lab Results: CBC  Recent Labs  06/28/2016 2111 06/10/16 1412  WBC 10.4 10.8*  HGB 15.8 15.7  HCT 46.5 47.0  PLT 152 153    BMET  Recent Labs  06/11/16 0511  06/12/16 0543  NA 129* 131*  K 5.8* 5.6*  CL 94* 89*  CO2 18* 13*  GLUCOSE 109* 48*  BUN 40* 51*  CREATININE 2.24* 3.41*  CALCIUM 9.9 10.2   Creat trend 2018  1.6=>3.4 2017  1.3--1.46     MICRO No results found for this or any previous visit (from the past 240 hour(s)).    Lab Results  Component Value Date   CALCIUM 10.2 06/12/2016   CAION 1.01 (L) 10/08/2014      Impression: 1)Renal  AKI secondary to ATN/post renal/prerenal/Cardiorenal  AKI on CKD               CKD stage 3.               CKD since 2017               CKD secondary to Cardiorenal/DM/HTN               Progression of CKD nw marked with AKI                               Oliguric ATN   2)HTN  Medication- On Vasodilators  3)Anemia HGb at goal (9--11)   4)CKD Mineral-Bone Disorder PTH not avail . Secondary Hyperparathyroidism w/u pending. Phosphorus will check.   5)CHF-hx of systolic CHF with EF 15-20% Elevated trops Cardiology and Primary MD following  6)Electrolytes  hyperkalemic hyponatremic   7)Acid base Co2 not  at goal   8) Liver-Pt with elevated LFT's Primary team and GI following  Plan:  Agree with holding metformin Will ask for ABG Will ask for CXR Will ask for CPK Will ask for FENA Will ask for bladder scan Will ask for am cortisol Will ask for foley to monitor I/o Will change IVF to 1/2 NS + of bicarb Agree with transfer for need of right heart cath.  I had extensive discussion with pt family about his kidney related issues .  Patients's wife. Daughter and niece were present in the room.  Izaah Westman S 06/12/2016, 12:54 PM

## 2016-06-12 NOTE — Progress Notes (Signed)
Hypoglycemic protocol initiated, patient received 1 ampule of DEXTROSE 50% given IV. Will continue to monitor patient.

## 2016-06-12 NOTE — Progress Notes (Signed)
Patient proceeding onto multisystemic failure worsening acute renal failure creatinine 3.4 due to diminished cardiac output and renal perfusion chronic systolic congestive heart failure ejection fraction 15-20% along with hepatic congestion inflammation due to chronic congestive heart failure LFTs grossly elevated will check serum ammonia level now patient is transferred to ICU for consideration of dobutamine infusion in hopes of augmenting for cardiac output and improving renal perfusion gastroenterology as well as nephrology consults requested. Patient and entire family aware of very poor prognosis due to multisystemic failure patient states he wants resuscitative attempts as well as respirator if need be. [name]:[id number] DOB: [birth year] DOA: 06/18/2016 PCP: [name] Stalling, MD   Physical Exam: Blood pressure 109/62, pulse 74, temperature 98.4 F (36.9 C), temperature source Oral, resp. rate 18, height 5\' 4"  (1.626 [name]), weight 69.2 kg (152 lb 8.9 oz), SpO2 96 %. Lungs diminished breath sounds in the bases scattered rhonchi no rales audible heart irregular regular no S3 no heaves thrills rubs extremities have SCD devices in place patient on Lovenox for DVT prophylaxis   Investigations:  No results found for this or any previous visit (from the past 240 hour(s)).   Basic Metabolic Panel:  Recent Labs  37/62/83 0511 06/11/16 1415 06/12/16 0543  NA 129*  --  131*  K 5.8*  --  5.6*  CL 94*  --  89*  CO2 18*  --  13*  GLUCOSE 109*  --  48*  BUN 40*  --  51*  CREATININE 2.24*  --  3.41*  CALCIUM 9.9  --  10.2  MG  --  2.4  --    Liver Function Tests:  Recent Labs  06/11/16 0511 06/12/16 0543  AST 70* 3,202*  ALT 37 1,131*  ALKPHOS 81 97  BILITOT 2.4* 4.1*  PROT 6.6 7.1  ALBUMIN 4.1 4.4     CBC:  Recent Labs  06/27/2016 2111 06/10/16 1412  WBC 10.4 10.8*  NEUTROABS 7.2  --   HGB 15.8 15.7  HCT 46.5 47.0  MCV 100.2* 99.8  PLT 152 153    No results  found.    Medications:   Impression:  Principal Problem:   Adjustment disorder with mixed anxiety and depressed mood Active Problems:   Permanent atrial fibrillation (HCC)   Fall   S/P ICD (internal cardiac defibrillator) procedure, 08/17/12, [employer] implanted   Diabetes type 2, uncontrolled (HCC)   HLD (hyperlipidemia)   Systolic CHF, chronic (HCC)   Hyponatremia   Suicidal ideation     Plan: Transfer to ICU consideration given for dobutamine infusion 2.5 mics per kilo per minute and titrate nephrology consult regarding acute renal failure. GI consult regarding hepatic failure and consideration given to encephalopathy with serum ammonia level pending  Consultants: Cardiology and nephrology and gastroenterology    Procedures   Antibiotics:           Time spent: 45 minutes   LOS: 3 days   [name]   06/12/2016, 1:04 PM

## 2016-06-12 NOTE — Consult Note (Addendum)
Cardiology Consultation   Patient ID: [name]; [id number]; October 04, 1934   Admit date: June 13, 2016 Date of Consult: 06/12/2016  Referring MD:  Dr. Janna Arch Cardiologist: [name] Consulting Cardiologist: Dr. Wyline Mood  Patient Care Team: Oval Linsey, MD as PCP - General (Internal Medicine) Lennette Bihari, MD as Attending Physician (Cardiology)    Reason for Consultation: worsening LV funciton   History of Present Illness: [name] is a 82 y.o. male with a hx of has a history of an ischemic cardiomyopathy. In April 1998 he suffered a large anterior wall myocardial infarction and underwent intervention to the LAD. In April 1999 a stent was placed to the LAD and he had PTCA of his circumflex vessel. In November 2004 he underwent stenting of his RCA. Subarachnoid hemorrhage after fall in 2014 while on coumadin for Atrial fib.. ICD 2014 for EF 20%. Also has HTN, HLD, peripheral edema, obesity, OSA on CPAP, recurrent falls, adjustment disorder.  Last saw [name] 05/23/16 complaining of PND. Lasix increased 60 mg am 40 pm, dig decreased and echo ordered but not yet done.   Patient now comes in with fall hitting head(neg CT for bleed), some nausea and suicidal thoughts. Found to be hyponatremic, on fluid restrictionEcho 06/10/16 EF 15-20% diffuse HK, mod LAE,see below.Last echo 10/2014 EF 20-25%. REnal function 1.65 on adm now 3.41, potassium 5.6 AST 3202, ALT 1131. Patient and family say all his symptoms started 2 days after neurologist started him on Lamotrigine 200 mg rather than a gradual titrating up like he's used to. Now with significant nausea. Didn't think increased lasix helped his PND.  Past Medical History:  Diagnosis Date  . Arteriosclerotic cardiovascular disease (ASCVD)   . At risk for sudden cardiac death 08-22-12  . Atrial fibrillation (HCC)    Onset in 2012  . Atrial flutter (HCC)   . Bleeding in brain due to brain aneurysm [facility]) October 06 2014  . CAD (coronary artery  disease)    a. s/p DES to LAD in 1999 b. DES to RCA in 2004 c. NST in 2014 showing scar along LAD territory and borderline ischemia along RCA  . CHF (congestive heart failure) (HCC)   . Chronic anticoagulation 2012   2012  . COPD (chronic obstructive pulmonary disease) (HCC)   . DJD (degenerative joint disease)   . Gout   . Hyperlipidemia   . ICD (implantable cardiac defibrillator) in place   . Ischemic cardiomyopathy   . Kidney stone    "just once" (08/17/2012)  . Myocardial infarction 1998  . NICM (nonischemic cardiomyopathy), EF 25-30% 08/22/12  . Obstructive sleep apnea    "went away when I lost a bunch of weight" (08/17/2012)  . Permanent atrial fibrillation (HCC) 02/15/2011   Initial onset in 03/2011 with rapid ventricular response   . S/P ICD (internal cardiac defibrillator) procedure, 08/17/12, [employer] Aug 22, 2012   [employer]  . Type II diabetes mellitus (HCC)     Past Surgical History:  Procedure Laterality Date  . CARDIAC CATHETERIZATION  02/2003  . CARDIAC DEFIBRILLATOR PLACEMENT  08/17/2012   [employer]  . CATARACT EXTRACTION W/ INTRAOCULAR LENS  IMPLANT, BILATERAL Bilateral ~ 2011  . CHOLECYSTECTOMY  2009  . CORONARY ANGIOPLASTY WITH STENT PLACEMENT  07/14/1996   "1" (08/17/2012)  . CYSTOSCOPY/RETROGRADE/URETEROSCOPY  06/28/2011   Procedure: CYSTOSCOPY/RETROGRADE/URETEROSCOPY;  Surgeon: Ky Barban, MD;  Location: [facility];  Service: Urology;  Laterality: Right;  . IMPLANTABLE CARDIOVERTER DEFIBRILLATOR IMPLANT N/A 08/17/2012   Procedure: IMPLANTABLE CARDIOVERTER DEFIBRILLATOR  IMPLANT;  Surgeon: Thurmon Fair, MD;  Location[facility] CATH LAB;  Service: Cardiovascular;  Laterality: N/A;  . KNEE ARTHROPLASTY Left 1978   "tendon & cartilege repair" (08/17/2012)  . Myoview perfusion scan  06/02/2012   low risk, extensive scar entire LAD & RCA territory  . s/p icd  08/2012   [employer]  . STONE EXTRACTION WITH BASKET  06/28/2011   Procedure: STONE EXTRACTION  WITH BASKET;  Surgeon: Ky Barban, MD;  Location: [facility];  Service: Urology;  Laterality: Right;  specimen given to family per MD  . US ECHOCARDIOGRAPHY  07/08/2012   EF <20%,mild MR,TR,LA severely dilated  . VASECTOMY  ~ 1964      Home Meds: Prior to Admission medications   Medication Sig Start Date End Date Taking? Authorizing Provider  aspirin EC 81 MG tablet Take 81 mg by mouth daily.   Yes Historical Provider, MD  Cholecalciferol (VITAMIN D3) 5000 units CAPS Take 1 capsule by mouth daily.   Yes Historical Provider, MD  digoxin (LANOXIN) 0.25 MG tablet Take 1/2 tablet daily Patient taking differently: Take 0.125 mg by mouth daily. Take 1/2 tablet daily 05/23/16  Yes Lennette Bihari, MD  furosemide (LASIX) 40 MG tablet Take 1.5 tablets in the morning and 1/2 tablet @ 4 Patient taking differently: Take 20-60 mg by mouth 2 (two) times daily. Take 1.5 tablets in the morning and 1/2 tablet @ 4 05/23/16  Yes Lennette Bihari, MD  isosorbide mononitrate (IMDUR) 30 MG 24 hr tablet Take 0.5 tablets (15 mg total) by mouth at bedtime. 05/23/16  Yes Lennette Bihari, MD  LamoTRIgine XR 200 MG TB24 Take one tablet at bedtime. Patient taking differently: Take 200 mg by mouth at bedtime. Take one tablet at bedtime. 05/29/16  Yes Butch Penny, NP  magnesium oxide (MAG-OX) 400 MG tablet Take 1 tablet (400 mg total) by mouth 2 (two) times daily. 01/15/16  Yes Mihai Croitoru, MD  metFORMIN (GLUCOPHAGE) 500 MG tablet Take 500 mg by mouth 2 (two) times daily with a meal.    Yes Historical Provider, MD  potassium chloride (K-DUR) 10 MEQ tablet Take 1 tablet (10 mEq total) by mouth daily. 01/15/16  Yes Mihai Croitoru, MD  pravastatin (PRAVACHOL) 40 MG tablet Take 1 tablet (40 mg total) by mouth daily. 01/15/16  Yes Mihai Croitoru, MD  lamoTRIgine (LAMICTAL) 25 MG tablet 1 tablet twice a day for 7 days, then 2 tablets twice a day for 7 days, then 3 tablets twice a day for 7 days, then Start new prescription for  lamotrigine XR 200mg , one tablet at bedtime. Patient not taking: Reported on 07-01-16 05/29/16   Butch Penny, NP    Current Medications: . aspirin EC  81 mg Oral Daily  . cholecalciferol  5,000 Units Oral Daily  . digoxin  0.125 mg Oral Daily  . furosemide  40 mg Intravenous Daily  . heparin  5,000 Units Subcutaneous Q8H  . insulin aspart  0-5 Units Subcutaneous QHS  . insulin aspart  0-9 Units Subcutaneous TID WC  . isosorbide mononitrate  15 mg Oral QHS  . lamoTRIgine  100 mg Oral BID  . magnesium oxide  400 mg Oral BID  . pravastatin  40 mg Oral Daily  . sodium chloride flush  3 mL Intravenous Q12H     Allergies:    Allergies  Allergen Reactions  . Novocain [Procaine] Nausea And Vomiting and Other (See Comments)    Passes out (also)  . Lipitor [  Atorvastatin] Other (See Comments)    Myalgias    . Procaine Hcl Nausea And Vomiting  . Tramadol Itching and Nausea And Vomiting    Social History:   The patient  reports that he quit smoking about 24 years ago. His smoking use included Cigarettes. He has a 80.00 pack-year smoking history. He has quit using smokeless tobacco. He reports that he does not drink alcohol or use drugs.    Family History:   The patient's family history includes Heart failure in his father and mother.   ROS:  Please see the history of present illness.  Review of Systems  Constitution: Positive for weakness and malaise/fatigue.  HENT: Negative.   Cardiovascular: Positive for dyspnea on exertion, leg swelling and orthopnea.  Respiratory: Positive for shortness of breath and sleep disturbances due to breathing.   Endocrine: Negative.   Hematologic/Lymphatic: Negative.   Musculoskeletal: Negative.   Gastrointestinal: Negative.   Genitourinary: Negative.   Neurological: Positive for loss of balance.  Psychiatric/Behavioral: Positive for depression and suicidal ideas.   All other ROS reviewed and negative.      Vital Signs: Blood pressure 109/62,  pulse 74, temperature 98.4 F (36.9 C), temperature source Oral, resp. rate 18, height 5\' 4"  (1.626 m), weight 152 lb 8.9 oz (69.2 kg), SpO2 96 %.   PHYSICAL EXAM: General:  Well nourished, well developed, in no acute distress  HEENT: normal Lymph: no adenopathy Neck: slight increase JVD Endocrine:  No thryomegaly Vascular: No carotid bruits; FA pulses 2+ bilaterally without bruits  Cardiac: irreg irreg distant HS; normal S1, S2; no murmur, rub, bruit, thrill, or heave Lungs:  Decreased breath sounds with few crackles at bases Abd: soft, nontender, no hepatomegaly  Ext: trace edema, Decreased distal pulses bilaterally Musculoskeletal:  No deformities, BUE and BLE strength normal and equal Skin: warm and dry  Neuro:  CNs 2-12 intact, no focal abnormalities noted Psych:  Normal affect    EKG:   06/10/16 Afib with old ant MI, no acute change personally reviewed.  Telemetry: atrial fib rate controlled. Personally reviewed.  Labs:  Recent Labs  2016-07-09 2111  TROPONINI 0.06*   Lab Results  Component Value Date   WBC 10.8 (H) 06/10/2016   HGB 15.7 06/10/2016   HCT 47.0 06/10/2016   MCV 99.8 06/10/2016   PLT 153 06/10/2016    Recent Labs Lab 06/12/16 0543  NA 131*  K 5.6*  CL 89*  CO2 13*  BUN 51*  CREATININE 3.41*  CALCIUM 10.2  PROT 7.1  BILITOT 4.1*  ALKPHOS 97  ALT 1,131*  AST 3,202*  GLUCOSE 48*   Lab Results  Component Value Date   CHOL 101 (L) 01/15/2016   HDL 36 (L) 01/15/2016   LDLCALC 46 01/15/2016   TRIG 97 01/15/2016   Lab Results  Component Value Date   DDIMER 0.75 (H) 03/25/2012    Radiology/Studies:  Dg Chest 2 View  Result Date: 2016/07/09 CLINICAL DATA:  Lost balance and fell 2 days ago, striking his head on wall. Bilateral lower extremity pitting edema. Dyspnea for several months. EXAM: CHEST  2 VIEW COMPARISON:  04/30/2016 FINDINGS: Stable cardiomegaly. The lungs are clear. Possible trace right pleural effusion. Pulmonary vasculature is  normal. The transvenous cardiac lead appears grossly intact. IMPRESSION: Stable cardiomegaly. Possible trace right pleural effusion. No interstitial or alveolar edema. Electronically Signed   By: Ellery Plunk M.D.   On: 2016-07-09 21:55   Ct Head Wo Contrast  Result Date: 2016-07-09 CLINICAL DATA:  Fall with head injury.  Initial encounter. EXAM: CT HEAD WITHOUT CONTRAST CT CERVICAL SPINE WITHOUT CONTRAST TECHNIQUE: Multidetector CT imaging of the head and cervical spine was performed following the standard protocol without intravenous contrast. Multiplanar CT image reconstructions of the cervical spine were also generated. COMPARISON:  07/12/2015 FINDINGS: CT HEAD FINDINGS Brain: No evidence of acute infarction, hemorrhage, hydrocephalus, or mass lesion. Variable extra-axial space thickness around the frontal lobes is stable from 2017. No definite extra-axial collection. Generalized atrophy, stable. Vascular: Atherosclerotic calcification. Skull: Negative for fracture. Sinuses/Orbits: Bilateral cataract resection.  No acute finding CT CERVICAL SPINE FINDINGS Alignment: Normal. Skull base and vertebrae: No acute fracture. No primary bone lesion or focal pathologic process. Soft tissues and spinal canal: No prevertebral fluid or swelling. No visible canal hematoma. Disc levels: Mild generalized disc narrowing and annulus calcification. No identified impingement. Upper chest: No acute finding IMPRESSION: No evidence of intracranial or cervical spine injury. Electronically Signed   By: Marnee Spring M.D.   On: 06/18/2016 21:53   Ct Cervical Spine Wo Contrast  Result Date: 06/25/2016 CLINICAL DATA:  Fall with head injury.  Initial encounter. EXAM: CT HEAD WITHOUT CONTRAST CT CERVICAL SPINE WITHOUT CONTRAST TECHNIQUE: Multidetector CT imaging of the head and cervical spine was performed following the standard protocol without intravenous contrast. Multiplanar CT image reconstructions of the cervical spine  were also generated. COMPARISON:  07/12/2015 FINDINGS: CT HEAD FINDINGS Brain: No evidence of acute infarction, hemorrhage, hydrocephalus, or mass lesion. Variable extra-axial space thickness around the frontal lobes is stable from 2017. No definite extra-axial collection. Generalized atrophy, stable. Vascular: Atherosclerotic calcification. Skull: Negative for fracture. Sinuses/Orbits: Bilateral cataract resection.  No acute finding CT CERVICAL SPINE FINDINGS Alignment: Normal. Skull base and vertebrae: No acute fracture. No primary bone lesion or focal pathologic process. Soft tissues and spinal canal: No prevertebral fluid or swelling. No visible canal hematoma. Disc levels: Mild generalized disc narrowing and annulus calcification. No identified impingement. Upper chest: No acute finding IMPRESSION: No evidence of intracranial or cervical spine injury. Electronically Signed   By: Marnee Spring M.D.   On: 06/28/2016 21:53     PROBLEM LIST:  Principal Problem:   Adjustment disorder with mixed anxiety and depressed mood Active Problems:   Permanent atrial fibrillation (HCC)   Fall   S/P ICD (internal cardiac defibrillator) procedure, 08/17/12, [employer] implanted   Diabetes type 2, uncontrolled (HCC)   HLD (hyperlipidemia)   Systolic CHF, chronic (HCC)   Hyponatremia   Suicidal ideation     ASSESSMENT AND PLAN:  ICM EF 15-20% with diffuse hypokinesis slightly worse than last echo in 2016 EF 20-25%. Currently compensated. On Lasix 40 mg once daily. Only on ASA and Imdur. No angina treatment limited because of low BP.  Anterior wall MI 1998 PCI to the LAD, stent to the LAD and PTCA of the circumflex 1999, stent to the RCA 2004, no angina. Nuclear stress test 2014 extensive scarring the entire LAD territory as well as a large portion of the RCA with minimal borderline ischemia EF 20%  Acute renal failure would ask renal to see. Stop digoxin  Nausea with abnormal LFTs and increased  bilirubin question related to recent drug lamotrigine? Would ask GI to see.  Permanent atrial fibrillation, has only been on digoxin which was recently decreased because of a creatinine of 1.53 but I'm stopping now with his renal failure. No anticoagulation because of recurrent falls and subarachnoid hemorrhage  Status post ICD 2014 recently checked fall of  2017  Hypertension  HLD on pravastatin  OSA on CPap  Recent fall CT negative    Elson Clan, PA-C  06/12/2016 9:36 AM   Patient seen and discussed with PA Geni Bers, I agree with her documentation above. 82 yo male history of ICM LVEF 20-25% by echo 10/2014 and this admit 15-20% , AICD, permanent afib, prior ICH now off anticoag admitted with falls and suicidal ideation. On admitted found to have multiple electrolyte abnormalities including hyponatremia and hypokalemia. Since yesterday severe elevated in CR as well as liver enzymes.    From notes CHF medical therapy limited by soft bp's.    06/2016 Echo: LVEF 15-20%, diffuse hypokinesis K 5.6 Cr 3.41 AST 3200 ALT 1131 T.bili 4.1 Ammonia 67  CXR no acute process EKG afib, RAD   Patient history of chronic systolic HF, LVEF overall stable from 10/2014 echo. I/Os are incomplete. Clinic weight last month 157 lbs, weight this admit 152 lbs. No edema on CXR, normal IVC by echo. History of nausea and poor oral intake at home all would support he is hypovolemic, his exam is not overally impressive for significant volume overload though he has some mild LE edema and JVD. We will d/c lasix especially with his mark elevation in Cr. Defer workup for the severe increase in his liver enzymes to primary team, this happened fairly acutely. There is no evidence of overt cardiogenic shock to cause this dramatic and acute of a rise in Cr and liver enzymes, blood pressures have been stable and he is not severly volume overloaded. Will hold pravastatin at this time. Clinical picture not consistent with  low flow CHF as etiology of his acute renal failure and liver failure, would consider nephrology and GI consults. Afib with high rates at times. He has not been on av nodal agents due to soft bp's. We will hold digoxin at this time due to renal failure. Follow rates at this time, if persistenly elevated may try very low dose beta blocker.   Dina Rich MD

## 2016-06-12 NOTE — Progress Notes (Signed)
Troponin=0.38  Dr Wyline Mood updated

## 2016-06-12 NOTE — Progress Notes (Signed)
Patient's CBG was 44, Dr Janna Arch called, not able to reach him at this time. Will continue to monitor patient. Family at bedside.

## 2016-06-13 ENCOUNTER — Inpatient Hospital Stay (HOSPITAL_COMMUNITY): Payer: Medicare HMO

## 2016-06-13 DIAGNOSIS — R7989 Other specified abnormal findings of blood chemistry: Secondary | ICD-10-CM

## 2016-06-13 DIAGNOSIS — F4323 Adjustment disorder with mixed anxiety and depressed mood: Secondary | ICD-10-CM

## 2016-06-13 DIAGNOSIS — I482 Chronic atrial fibrillation: Secondary | ICD-10-CM

## 2016-06-13 DIAGNOSIS — N170 Acute kidney failure with tubular necrosis: Secondary | ICD-10-CM

## 2016-06-13 DIAGNOSIS — I5043 Acute on chronic combined systolic (congestive) and diastolic (congestive) heart failure: Secondary | ICD-10-CM

## 2016-06-13 DIAGNOSIS — I5021 Acute systolic (congestive) heart failure: Secondary | ICD-10-CM

## 2016-06-13 DIAGNOSIS — E782 Mixed hyperlipidemia: Secondary | ICD-10-CM

## 2016-06-13 DIAGNOSIS — J930 Spontaneous tension pneumothorax: Secondary | ICD-10-CM

## 2016-06-13 DIAGNOSIS — R945 Abnormal results of liver function studies: Secondary | ICD-10-CM

## 2016-06-13 DIAGNOSIS — I509 Heart failure, unspecified: Secondary | ICD-10-CM

## 2016-06-13 LAB — POCT I-STAT 3, ART BLOOD GAS (G3+)
ACID-BASE DEFICIT: 2 mmol/L (ref 0.0–2.0)
BICARBONATE: 19.6 mmol/L — AB (ref 20.0–28.0)
O2 SAT: 99 %
PCO2 ART: 26.2 mmHg — AB (ref 32.0–48.0)
PH ART: 7.478 — AB (ref 7.350–7.450)
PO2 ART: 102 mmHg (ref 83.0–108.0)
Patient temperature: 96.8
TCO2: 20 mmol/L (ref 0–100)

## 2016-06-13 LAB — COOXEMETRY PANEL
CARBOXYHEMOGLOBIN: 1.3 % (ref 0.5–1.5)
METHEMOGLOBIN: 0.8 % (ref 0.0–1.5)
O2 SAT: 84 %
Total hemoglobin: 14.5 g/dL (ref 12.0–16.0)

## 2016-06-13 LAB — GLUCOSE, CAPILLARY
GLUCOSE-CAPILLARY: 171 mg/dL — AB (ref 65–99)
GLUCOSE-CAPILLARY: 165 mg/dL — AB (ref 65–99)
Glucose-Capillary: 156 mg/dL — ABNORMAL HIGH (ref 65–99)
Glucose-Capillary: 142 mg/dL — ABNORMAL HIGH (ref 65–99)

## 2016-06-13 LAB — POCT I-STAT 3, VENOUS BLOOD GAS (G3P V)
Acid-base deficit: 3 mmol/L — ABNORMAL HIGH (ref 0.0–2.0)
BICARBONATE: 21.8 mmol/L (ref 20.0–28.0)
O2 Saturation: 92 %
PO2 VEN: 65 mmHg — AB (ref 32.0–45.0)
Patient temperature: 98.2
TCO2: 23 mmol/L (ref 0–100)
pCO2, Ven: 36.8 mmHg — ABNORMAL LOW (ref 44.0–60.0)
pH, Ven: 7.379 (ref 7.250–7.430)

## 2016-06-13 LAB — BASIC METABOLIC PANEL
Anion gap: 20 — ABNORMAL HIGH (ref 5–15)
Anion gap: 17 — ABNORMAL HIGH (ref 5–15)
BUN: 70 mg/dL — AB (ref 6–20)
BUN: 75 mg/dL — AB (ref 6–20)
CALCIUM: 9.2 mg/dL (ref 8.9–10.3)
CHLORIDE: 91 mmol/L — AB (ref 101–111)
CO2: 16 mmol/L — AB (ref 22–32)
CO2: 18 mmol/L — ABNORMAL LOW (ref 22–32)
CREATININE: 3.89 mg/dL — AB (ref 0.61–1.24)
CREATININE: 3.78 mg/dL — AB (ref 0.61–1.24)
Calcium: 9 mg/dL (ref 8.9–10.3)
Chloride: 89 mmol/L — ABNORMAL LOW (ref 101–111)
GFR calc Af Amer: 16 mL/min — ABNORMAL LOW (ref >60)
GFR calc non Af Amer: 13 mL/min — ABNORMAL LOW (ref >60)
GFR calc non Af Amer: 14 mL/min — ABNORMAL LOW (ref >60)
GFR, EST AFRICAN AMERICAN: 16 mL/min — AB (ref >60)
GLUCOSE: 159 mg/dL — AB (ref 65–99)
Glucose, Bld: 152 mg/dL — ABNORMAL HIGH (ref 65–99)
POTASSIUM: 5.9 mmol/L — AB (ref 3.5–5.1)
Potassium: 6.3 mmol/L (ref 3.5–5.1)
SODIUM: 125 mmol/L — AB (ref 135–145)
SODIUM: 126 mmol/L — AB (ref 135–145)

## 2016-06-13 LAB — AMMONIA: AMMONIA: 38 umol/L — AB (ref 9–35)

## 2016-06-13 LAB — ABO/RH: ABO/RH(D): O POS

## 2016-06-13 LAB — TROPONIN I
TROPONIN I: 0.25 ng/mL — AB (ref <0.03)
TROPONIN I: 0.26 ng/mL — AB (ref <0.03)
Troponin I: 0.22 ng/mL (ref <0.03)

## 2016-06-13 LAB — HEPATITIS PANEL, ACUTE
HCV Ab: <0.1 s/co ratio (ref 0.0–0.9)
HEP A IGM: NEGATIVE
HEP B C IGM: NEGATIVE
Hepatitis B Surface Ag: NEGATIVE

## 2016-06-13 LAB — PROTIME-INR
INR: 2.94
Prothrombin Time: 31.3 seconds — ABNORMAL HIGH (ref 11.4–15.2)

## 2016-06-13 LAB — TRIGLYCERIDES: Triglycerides: 97 mg/dL (ref <150)

## 2016-06-13 MED ORDER — INSULIN ASPART 100 UNIT/ML ~~LOC~~ SOLN
2.0000 [IU] | SUBCUTANEOUS | Status: DC
Start: 2016-06-13 — End: 2016-06-19
  Administered 2016-06-13: 4 [IU] via SUBCUTANEOUS
  Administered 2016-06-13 – 2016-06-15 (×8): 2 [IU] via SUBCUTANEOUS
  Administered 2016-06-15 – 2016-06-16 (×4): 4 [IU] via SUBCUTANEOUS
  Administered 2016-06-16: 6 [IU] via SUBCUTANEOUS
  Administered 2016-06-16: 2 [IU] via SUBCUTANEOUS
  Administered 2016-06-16 (×2): 4 [IU] via SUBCUTANEOUS
  Administered 2016-06-17: 6 [IU] via SUBCUTANEOUS
  Administered 2016-06-17: 4 [IU] via SUBCUTANEOUS
  Administered 2016-06-17: 6 [IU] via SUBCUTANEOUS
  Administered 2016-06-17 – 2016-06-18 (×8): 4 [IU] via SUBCUTANEOUS

## 2016-06-13 MED ORDER — FUROSEMIDE 10 MG/ML IJ SOLN
120.0000 mg | Freq: Three times a day (TID) | INTRAVENOUS | Status: DC
  Administered 2016-06-13 – 2016-06-15 (×5): 120 mg via INTRAVENOUS
  Filled 2016-06-13 (×6): qty 12

## 2016-06-13 MED ORDER — FENTANYL CITRATE (PF) 100 MCG/2ML IJ SOLN
100.0000 ug | INTRAMUSCULAR | Status: DC | PRN
  Administered 2016-06-16 (×2): 100 ug via INTRAVENOUS
  Filled 2016-06-13 (×2): qty 2

## 2016-06-13 MED ORDER — FENTANYL CITRATE (PF) 100 MCG/2ML IJ SOLN
INTRAMUSCULAR | Status: AC
  Administered 2016-06-13: 200 ug
  Filled 2016-06-13: qty 4

## 2016-06-13 MED ORDER — ROCURONIUM BROMIDE 50 MG/5ML IV SOLN
50.0000 mg | Freq: Once | INTRAVENOUS | Status: AC
  Administered 2016-06-13: 50 mg via INTRAVENOUS

## 2016-06-13 MED ORDER — MIDAZOLAM HCL 2 MG/2ML IJ SOLN
INTRAMUSCULAR | Status: AC
  Administered 2016-06-13: 4 mg
  Filled 2016-06-13: qty 4

## 2016-06-13 MED ORDER — FENTANYL CITRATE (PF) 100 MCG/2ML IJ SOLN: 100.0000 ug | INTRAMUSCULAR | Status: DC | PRN

## 2016-06-13 MED ORDER — PATIROMER SORBITEX CALCIUM 8.4 G PO PACK
8.4000 g | PACK | Freq: Once | ORAL | Status: AC
  Administered 2016-06-13: 8.4 g via ORAL
  Filled 2016-06-13: qty 4

## 2016-06-13 MED ORDER — FUROSEMIDE 10 MG/ML IJ SOLN
120.0000 mg | Freq: Once | INTRAVENOUS | Status: AC
  Administered 2016-06-13: 120 mg via INTRAVENOUS
  Filled 2016-06-13: qty 12

## 2016-06-13 MED ORDER — CHLORHEXIDINE GLUCONATE CLOTH 2 % EX PADS
6.0000 | MEDICATED_PAD | Freq: Every day | CUTANEOUS | Status: DC
  Administered 2016-06-14 – 2016-06-18 (×4): 6 via TOPICAL

## 2016-06-13 MED ORDER — CHLORHEXIDINE GLUCONATE 0.12% ORAL RINSE (MEDLINE KIT)
15.0000 mL | Freq: Two times a day (BID) | OROMUCOSAL | Status: DC
  Administered 2016-06-13 – 2016-06-19 (×13): 15 mL via OROMUCOSAL

## 2016-06-13 MED ORDER — SODIUM CHLORIDE 0.9% FLUSH
10.0000 mL | Freq: Two times a day (BID) | INTRAVENOUS | Status: DC
  Administered 2016-06-13: 10 mL
  Administered 2016-06-14: 30 mL
  Administered 2016-06-14 – 2016-06-15 (×2): 10 mL
  Administered 2016-06-15: 30 mL
  Administered 2016-06-16: 10 mL
  Administered 2016-06-16: 30 mL
  Administered 2016-06-17 – 2016-06-18 (×2): 10 mL

## 2016-06-13 MED ORDER — FUROSEMIDE 10 MG/ML IJ SOLN
120.0000 mg | Freq: Three times a day (TID) | INTRAVENOUS | Status: DC
  Filled 2016-06-13: qty 12

## 2016-06-13 MED ORDER — ORAL CARE MOUTH RINSE
15.0000 mL | OROMUCOSAL | Status: DC
  Administered 2016-06-13 – 2016-06-19 (×54): 15 mL via OROMUCOSAL

## 2016-06-13 MED ORDER — PROPOFOL 1000 MG/100ML IV EMUL
0.0000 ug/kg/min | INTRAVENOUS | Status: DC
  Administered 2016-06-13: 5 ug/kg/min via INTRAVENOUS
  Administered 2016-06-14: 10 ug/kg/min via INTRAVENOUS
  Administered 2016-06-15: 15 ug/kg/min via INTRAVENOUS
  Filled 2016-06-13 (×3): qty 100

## 2016-06-13 MED ORDER — SODIUM CHLORIDE 0.9% FLUSH: 10.0000 mL | INTRAVENOUS | Status: DC | PRN

## 2016-06-13 MED ORDER — SODIUM CHLORIDE 0.9 % IV SOLN: Freq: Once | INTRAVENOUS | Status: DC

## 2016-06-13 MED ORDER — ETOMIDATE 2 MG/ML IV SOLN
20.0000 mg | Freq: Once | INTRAVENOUS | Status: AC
  Administered 2016-06-13: 20 mg via INTRAVENOUS

## 2016-06-13 NOTE — Progress Notes (Signed)
eLink Physician-Brief Progress Note Patient Name: [name] DOB: 05-03-1934 MRN: [id number]   Date of Service  06/13/2016  HPI/Events of Note  Patient with pneumothorax post intubation on the left. Moderate to large in size. No evidence of tracheal deviation. Endotracheal tube in appropriate position.   eICU Interventions  Intensivist at bedside nurse practitioner notified for emergent chest tube needed      Intervention Category Major Interventions: Other:  Lawanda Cousins 06/13/2016, 6:22 PM

## 2016-06-13 NOTE — Progress Notes (Signed)
NP Janyth Contes paged to make aware of pt's increasing need for O2. Pt moved from 4L nasal canula to venturi mask 6L at 1353. Pt increasingly agitated and pulling at mask and foley catheter. Will advise respiratory to obtain ABG. NP Janyth Contes gave verbal order to temporarily restrain pt in order to get NG tube placement for medication administration. Family at bedside and updated on plan. Will continue to monitor.

## 2016-06-13 NOTE — Consult Note (Signed)
PULMONARY / CRITICAL CARE MEDICINE   Name: [name] MRN: [id number] DOB: 01-18-1935    ADMISSION DATE:  06/10/2016 CONSULTATION DATE:  06/13/2016  REFERRING MD:  Dr. Caleb Popp   CHIEF COMPLAINT:  Acute on Chronic HF  HISTORY OF PRESENT ILLNESS:   82 year old male with extensive medical history as below, presents to ED on 3/5 at [facility] post-fall. Family reports that patient has recently started Lamictal and as felt nauseous and dizzy. In December patient was on Depakote ER for a history of seizures - could not tolerate so switched to Lamictal. During stay patient developed acute on chronic kidney injury, acute on chronic systolic heart failure, and hepatic kidney injury. Decision was made to transfer patient to [facility] for possible right heart cath.   PAST MEDICAL HISTORY :  He  has a past medical history of Arteriosclerotic cardiovascular disease (ASCVD); At risk for sudden cardiac death (09/02/2012); Atrial fibrillation (HCC); Atrial flutter (HCC); Bleeding in brain due to brain aneurysm [facility]) (October 06 2014); CAD (coronary artery disease); CHF (congestive heart failure) (HCC); Chronic anticoagulation (2012); COPD (chronic obstructive pulmonary disease) (HCC); DJD (degenerative joint disease); Gout; Hyperlipidemia; ICD (implantable cardiac defibrillator) in place; Ischemic cardiomyopathy; Kidney stone; Myocardial infarction (1998); NICM (nonischemic cardiomyopathy), EF 25-30% (2012/09/02); Obstructive sleep apnea; Permanent atrial fibrillation (HCC) (02/15/2011); S/P ICD (internal cardiac defibrillator) procedure, 08/17/12, [employer] (09-02-12); and Type II diabetes mellitus (HCC).  PAST SURGICAL HISTORY: He  has a past surgical history that includes Cholecystectomy (2009); Knee Arthroplasty (Left, 1978); Vasectomy (~ 1964); Cystoscopy/retrograde/ureteroscopy (06/28/2011); Stone extraction with basket (06/28/2011); Cardiac defibrillator placement (08/17/2012); Coronary angioplasty  with stent (07/14/1996); Cataract extraction w/ intraocular lens  implant, bilateral (Bilateral, ~ 2011); US ECHOCARDIOGRAPHY (07/08/2012); Myoview perfusion scan (06/02/2012); s/p icd (08/2012); implantable cardioverter defibrillator implant (N/A, 08/17/2012); and Cardiac catheterization (02/2003).  Allergies  Allergen Reactions  . Novocain [Procaine] Nausea And Vomiting and Other (See Comments)    Passes out (also)  . Lipitor [Atorvastatin] Other (See Comments)    Myalgias    . Procaine Hcl Nausea And Vomiting  . Tramadol Itching and Nausea And Vomiting    No current facility-administered medications on file prior to encounter.    Current Outpatient Prescriptions on File Prior to Encounter  Medication Sig  . aspirin EC 81 MG tablet Take 81 mg by mouth daily.  . Cholecalciferol (VITAMIN D3) 5000 units CAPS Take 1 capsule by mouth daily.  . digoxin (LANOXIN) 0.25 MG tablet Take 1/2 tablet daily (Patient taking differently: Take 0.125 mg by mouth daily. Take 1/2 tablet daily)  . furosemide (LASIX) 40 MG tablet Take 1.5 tablets in the morning and 1/2 tablet @ 4 (Patient taking differently: Take 20-60 mg by mouth 2 (two) times daily. Take 1.5 tablets in the morning and 1/2 tablet @ 4)  . isosorbide mononitrate (IMDUR) 30 MG 24 hr tablet Take 0.5 tablets (15 mg total) by mouth at bedtime.  . LamoTRIgine XR 200 MG TB24 Take one tablet at bedtime. (Patient taking differently: Take 200 mg by mouth at bedtime. Take one tablet at bedtime.)  . magnesium oxide (MAG-OX) 400 MG tablet Take 1 tablet (400 mg total) by mouth 2 (two) times daily.  . metFORMIN (GLUCOPHAGE) 500 MG tablet Take 500 mg by mouth 2 (two) times daily with a meal.   . potassium chloride (K-DUR) 10 MEQ tablet Take 1 tablet (10 mEq total) by mouth daily.  . pravastatin (PRAVACHOL) 40 MG tablet Take 1 tablet (40 mg total)  by mouth daily.  Marland Kitchen lamoTRIgine (LAMICTAL) 25 MG tablet 1 tablet twice a day for 7 days, then 2 tablets twice a day for  7 days, then 3 tablets twice a day for 7 days, then Start new prescription for lamotrigine XR 200mg , one tablet at bedtime. (Patient not taking: Reported on 07/02/2016)    FAMILY HISTORY:  His indicated that his mother is deceased. He indicated that his father is deceased.    SOCIAL HISTORY: He  reports that he quit smoking about 24 years ago. His smoking use included Cigarettes. He has a 80.00 pack-year smoking history. He has quit using smokeless tobacco. He reports that he does not drink alcohol or use drugs.  REVIEW OF SYSTEMS:   Unable to review as patient is obtunded.   SUBJECTIVE:  On Dopamine gtt.   VITAL SIGNS: BP 110/87 (BP Location: Left Arm)   Pulse (!) 105   Temp (!) 96.7 F (35.9 C) (Axillary)   Resp 18   Ht 5\' 4"  (1.626 m)   Wt 163 lb 9.3 oz (74.2 kg)   SpO2 95%   BMI 28.08 kg/m   HEMODYNAMICS:    VENTILATOR SETTINGS:    INTAKE / OUTPUT: I/O last 3 completed shifts: In: 2762.4 [P.O.:180; I.V.:2582.4] Out: 250 [Urine:250]  PHYSICAL EXAMINATION: General:  Adult male, lying in bed  Neuro:  Lethargic, delayed responses,  follows commands, moves extremities  HEENT:  Normocephalic  Cardiovascular:  Tachy, no MRG Lungs:  Crackles throughout, non-labored  Abdomen:  Non-tender, active bowel sounds  Musculoskeletal:  No acute  Skin:  Warm, dry, intact   LABS:  BMET  Recent Labs Lab 06/11/16 0511 06/12/16 0543 06/13/16 0833  NA 129* 131* 123*  K 5.8* 5.6* 6.7*  CL 94* 89* 89*  CO2 18* 13* 15*  BUN 40* 51* 71*  CREATININE 2.24* 3.41* 3.90*  GLUCOSE 109* 48* 148*    Electrolytes  Recent Labs Lab 06/11/16 0511 06/11/16 1415 06/12/16 0543 06/13/16 0833  CALCIUM 9.9  --  10.2 9.0  MG  --  2.4  --   --     CBC  Recent Labs Lab 06/18/2016 2111 06/10/16 1412  WBC 10.4 10.8*  HGB 15.8 15.7  HCT 46.5 47.0  PLT 152 153    Coag's  Recent Labs Lab 06/13/16 0833  INR 2.94    Sepsis Markers No results for input(s): LATICACIDVEN,  PROCALCITON, O2SATVEN in the last 168 hours.  ABG  Recent Labs Lab 06/12/16 1730  PHART 7.347*  PCO2ART 25.8*  PO2ART 90.0    Liver Enzymes  Recent Labs Lab 06/12/16 0543 06/12/16 1342 06/13/16 0833  AST 3,202* 4,975* 2,378*  ALT 1,131* 1,803* 1,567*  ALKPHOS 97 99 96  BILITOT 4.1* 3.8* 3.5*  ALBUMIN 4.4 4.3 4.0    Cardiac Enzymes  Recent Labs Lab 06/07/2016 2111 06/12/16 1342  TROPONINI 0.06* 0.38*    Glucose  Recent Labs Lab 06/12/16 0749 06/12/16 0818 06/12/16 1111 06/12/16 1729 06/12/16 2102 06/13/16 0824  GLUCAP 44* 81 131* 140* 151* 156*    Imaging US Renal  Result Date: 06/12/2016 CLINICAL DATA:  82 year old male with acute tubular necrosis. Initial encounter. EXAM: RENAL / URINARY TRACT ULTRASOUND COMPLETE COMPARISON:  Noncontrast CT Abdomen and Pelvis 12/15/2013 FINDINGS: Right Kidney: Length: 10.4 cm. Thinning of the renal cortex. Cortical echogenicity at the upper limits of normal to mildly increased (image 9). No hydronephrosis or right renal mass. Left Kidney: Length: 10.6 cm. Better preserved renal cortex, although cortical echogenicity appears to  be similar to the right side at the upper limits of normal to mildly increased. Suspected 7-8 mm lower pole calculus (image 36). No left hydronephrosis or renal mass. Bladder: Appears normal for degree of bladder distention. Other findings: Moderate volume perihepatic ascites with nodular liver contour (image 69). Small volume pelvic ascites (image 63). IMPRESSION: 1. No acute renal findings. Renal cortical atrophy and possible chronic medical renal disease. Suspected nonobstructing left lower pole renal calculus. 2. Cirrhotic appearing liver with small to moderate volume ascites. Electronically Signed   By: Odessa Fleming M.D.   On: 06/12/2016 15:35   [place] Art/ven Flow Abd Pelv Doppler  Result Date: 06/12/2016 CLINICAL DATA:  Elevated LFTs.  Previous cholecystectomy. EXAM: LIMITED RIGHT UPPER QUADRANT ABDOMINAL  ULTRASOUND US HEPATIC DOPPLER ARTERIAL/VENOUS ULTRASOUND COMPARISON:  CT 12/15/2013 FINDINGS: Gallbladder:  Surgically absent Common bile duct:  Normal in caliber, 6mm diameter. Liver: Homogeneous in echotexture without focal lesion or intrahepatic bile duct dilatation. Portal Vein 5 mm diameter.  Velocities (all hepatopetal): Main:  30-44 cm/sec Right: 26 cm/sec Left:  22 cm/sec Hepatic Vein Velocities (all hepatofugal): Right:  53 cm/sec Middle:  73 cm/sec Left:  59 cm/sec Hepatic Artery Velocity: 56 cm/sec IVC:  48 cm/ sec intrahepatic segment Ascites: Present Right pleural effusion is also noted. IMPRESSION: 1.  Normal hepatic vascular Doppler evaluation. 2. Abdominal ascites and right pleural effusion. Electronically Signed   By: Corlis Leak M.D.   On: 06/12/2016 16:49   Dg Chest Port 1 View  Result Date: 06/12/2016 CLINICAL DATA:  CHF, COPD, former smoker. Patient reports shortness of breath. EXAM: PORTABLE CHEST 1 VIEW COMPARISON:  PA and lateral chest x-ray of 06/17/16 FINDINGS: The lungs are less well inflated today. The interstitial markings are increased. An area of alveolar opacity is present in the right lower lung. There is no pleural effusion or pneumothorax. The cardiac silhouette is enlarged and the pulmonary vascularity is engorged. The ICD is in stable position. There is calcification in the wall of the aortic arch. IMPRESSION: Interval development of pulmonary interstitial edema secondary to CHF. Patchy alveolar density at the right lung base likely reflects early alveolar edema. Electronically Signed   By: David  Swaziland M.D.   On: 06/12/2016 16:22   US Abdomen Limited Ruq  Result Date: 06/12/2016 CLINICAL DATA:  Elevated LFTs.  Previous cholecystectomy. EXAM: LIMITED RIGHT UPPER QUADRANT ABDOMINAL ULTRASOUND US HEPATIC DOPPLER ARTERIAL/VENOUS ULTRASOUND COMPARISON:  CT 12/15/2013 FINDINGS: Gallbladder:  Surgically absent Common bile duct:  Normal in caliber, 6mm diameter. Liver:  Homogeneous in echotexture without focal lesion or intrahepatic bile duct dilatation. Portal Vein 5 mm diameter.  Velocities (all hepatopetal): Main:  30-44 cm/sec Right: 26 cm/sec Left:  22 cm/sec Hepatic Vein Velocities (all hepatofugal): Right:  53 cm/sec Middle:  73 cm/sec Left:  59 cm/sec Hepatic Artery Velocity: 56 cm/sec IVC:  48 cm/ sec intrahepatic segment Ascites: Present Right pleural effusion is also noted. IMPRESSION: 1.  Normal hepatic vascular Doppler evaluation. 2. Abdominal ascites and right pleural effusion. Electronically Signed   By: Corlis Leak M.D.   On: 06/12/2016 16:49     STUDIES:  CT C Spine 3/4 > Neg acute injury  CT Head 3/4 > No acute of intracranial or cervical spine injury  ECHO 3/5 > 15-20% EF, RV moderately dilated with mild to moderately decreases systolic function  ABD [place] 3/7 > Normal hepatic vascular doppler evaluation, abdominal ascites and right pleural effusion  Renal US 3/7 > No acute findings, renal  cortical atrophy and possible chronic medical rental disease, suspected nonobstructive left lower pole renal calculus, cirrhotic appearing liver with small to moderate volume ascites  CULTURES: MRSA PCR 3/7 > Negative   ANTIBIOTICS: None.   SIGNIFICANT EVENTS: 3/4 > ED for fall/SI   LINES/TUBES:   DISCUSSION: 82 year old male with extensive cardiac and renal history. Admitted 3/4 post-fall with suicidal ideation. Admission complicated by acute on chronic systolic HF, acute hepatic injury, and acute on chronic kidney disease. Patient transferred to [facility] Step-down for possible Heart Cath. On 3/8 Heart Failure Service   ASSESSMENT / PLAN:  PULMONARY A: H/O OSA on CPAP P:   Low thresh hold for intubation given encephalopathy  Maintain Saturation >92  CARDIOVASCULAR A:  Acute on Chronic Systolic HF (EF 15-20 %) Elevated Troponin  Ischemic Cardiomyopathy S/P PTCA, Stent, ICD H/O HTN, HLD, Afib (not on anticoagulation due to history of falls)   -BNP 2370 P:  Heart Failure following  Cardiac Monitoring  Will place CVC for Dobutrex gtt  Trend Troponin  Hold home ASA, IMDUR, digoxin   RENAL A:   Acute on Chronic Kidney Disease Hyperkalemia  Hyponatremia  P:   Consult Nephrology - CVVD vs Diuresis --Nephology suggested 120 mg IV lasix  Trend BMP Replace Electrolytes as needed    GASTROINTESTINAL A:   Acute Hepatic Injury  -AST 4975, ALT 1803 -Hep Panel Negative  P:   NPO  Trend LFT Trend Ammonia   HEMATOLOGIC A:   Supratherapeutic INR  P:  Trend CBC Hold anticoagulation   INFECTIOUS A:   No issues  P:   Trend Fever and WBC curve   ENDOCRINE A:   Hyperglycemia  H/O DM P:   SSI Q4H glucose checks Hold metformin   NEUROLOGIC A:   Uremic Encephalopathy  Mixed anxiety and depression  H/O Seizures, SI  P:   Monitor  RASS goal: 0   FAMILY  - Updates: Attempted to call wife to discuss GOC. Son at beside states she is on here way.   - Inter-disciplinary family meet or Palliative Care meeting due by:  3/15    Jovita Kussmaul, AG-ACNP [facility]  Pgr: [phone]  PCCM Pgr: [phone]

## 2016-06-13 NOTE — Procedures (Signed)
Chest Tube Insertion Procedure Note  Indications:  Clinically significant Pneumothorax  Pre-operative Diagnosis: Pneumothorax  Post-operative Diagnosis: Pneumothorax  Procedure Details  Informed consent not obtained due to emergent need   After sterile skin prep, using standard technique, a 14 French tube was placed in the left lateral rib space.  Findings: None  Estimated Blood Loss:  Minimal         Specimens:  None              Complications:  None; patient tolerated the procedure well.         Disposition: ICU - intubated and critically ill.         Condition: unstable  Attending Attestation: I performed the procedure.  Chilton Greathouse MD [facility] Pager [phone] If no answer or after 3pm call: [phone] 06/13/2016, 7:14 PM

## 2016-06-13 NOTE — Progress Notes (Signed)
CRITICAL VALUE ALERT  Critical value received: potassium (6.7) per lab the sample was slightly hemolized.  Date of notification:  06/13/2016  Time of notification:  0938  Critical value read back:Yes.    Nurse who received alert:  Lovie Macadamia RN  MD notified (1st page):  Dr Raynelle Dick  Time of first page:  0940  MD notified (2nd page):  Time of second page:  Responding MD:  Dr Caleb Popp (was made aware that sample was slightly hemolized. Per MD will repeat potassium.  Time MD responded: 0945

## 2016-06-13 NOTE — Progress Notes (Signed)
PROGRESS NOTE    [name] FONTENETTE  [name]:[id number] DOB: [birth year] DOA: 06/21/2016 PCP: [name], MD   Brief Narrative: [name] is a 82 y.o. with heart failure presents as a transfer from [facility] for decompensated heart failure.   Assessment & Plan:   Principal Problem:   Adjustment disorder with mixed anxiety and depressed mood Active Problems:   Permanent atrial fibrillation (HCC)   Fall   S/P ICD (internal cardiac defibrillator) procedure, 08/17/12, [employer] implanted   Diabetes type 2, uncontrolled (HCC)   HLD (hyperlipidemia)   Systolic CHF, chronic (HCC)   Hyponatremia   Suicidal ideation   Acute on chronic systolic heart failure Last EF of 15-20% on 06/10/2016. He has been heavily diuresed with minimal output per In/Outs and is currently net positive. Weight is up as well, which makes me think these In/Outs may be accurate. Renal and hepatic function worsening rapidly. -Heart failure team recommendations -continue diuresis for now -strict in/out -daily weight -patient decompensating with decreased mental status. I feel this patient is likely to quickly decompensate. After discussion with Heart Failure team, we are in agreement that he needs to be transferred to ICU to obtain a central line. Would not be surprised if he will need intubation in the near future. He is full code. Discussed with CCM who will evaluate patient. Dobutamine continued.   Code Status: Full code Family Communication: None Disposition Plan: Transfer to ICU   Consultants:   Cardiology, heart failure team  Procedures:  Echocardiogram (06/10/2016)  Antimicrobials:  None    Subjective: Unable to answer questions appropriately secondary to altered mental status.  Objective: Vitals:   06/13/16 0000 06/13/16 0040 06/13/16 0400 06/13/16 0432  BP: 102/71 114/73 91/72 99/70   Pulse: 91 88 (!) 49 (!) 105  Resp: 14 14 14 18   Temp:  97.6 F (36.4 C)  97.4 F (36.3 C)    TempSrc:  Axillary  Axillary  SpO2: 97% 93%  95%  Weight:      Height:        Intake/Output Summary (Last 24 hours) at 06/13/16 0803 Last data filed at 06/13/16 0520  Gross per 24 hour  Intake          2642.35 ml  Output              250 ml  Net          2392.35 ml   Filed Weights   06/10/16 0117 06/12/16 1352 06/12/16 2251  Weight: 69.2 kg (152 lb 8.9 oz) 73.4 kg (161 lb 13.1 oz) 74.2 kg (163 lb 9.3 oz)    Examination:  General exam: Appears calm and comfortable Respiratory system: Clear to auscultation. Respiratory effort normal. Cardiovascular system: S1 & S2 heard, RRR.  Gastrointestinal system: Abdomen is nondistended, soft and nontender. No organomegaly or masses felt. Normal bowel sounds heard. Central nervous system: Somnolent. Answers questions incoherently. Obeys commands.  Extremities: No calf tenderness Skin: No cyanosis. No rashes Psychiatry: Judgement and insight appear normal. Mood & affect appropriate.     Data Reviewed: I have personally reviewed following labs and imaging studies  CBC:  Recent Labs Lab 06/20/2016 2111 06/10/16 1412  WBC 10.4 10.8*  NEUTROABS 7.2  --   HGB 15.8 15.7  HCT 46.5 47.0  MCV 100.2* 99.8  PLT 152 153   Basic Metabolic Panel:  Recent Labs Lab 06/14/2016 2111 06/10/16 0455 06/11/16 0511 06/11/16 1415 06/12/16 0543  NA 129* 131* 129*  --  131*  K 4.4 4.4 5.8*  --  5.6*  CL 96* 94* 94*  --  89*  CO2 22 24 18*  --  13*  GLUCOSE 158* 133* 109*  --  48*  BUN 28* 30* 40*  --  51*  CREATININE 1.59* 1.65* 2.24*  --  3.41*  CALCIUM 9.8 9.8 9.9  --  10.2  MG  --   --   --  2.4  --    GFR: Estimated Creatinine Clearance: 16.2 mL/min (by C-G formula based on SCr of 3.41 mg/dL (H)). Liver Function Tests:  Recent Labs Lab 06/12/2016 2111 06/11/16 0511 06/12/16 0543 06/12/16 1342  AST 25 70* 3,202* 4,975*  ALT 16* 37 1,131* 1,803*  ALKPHOS 69 81 97 99  BILITOT 1.8* 2.4* 4.1* 3.8*  PROT 6.5 6.6 7.1 6.9  ALBUMIN 4.0  4.1 4.4 4.3    Recent Labs Lab 07/01/2016 2111  LIPASE 13    Recent Labs Lab 06/11/16 0511 06/12/16 1342  AMMONIA 67* 45*   Coagulation Profile: No results for input(s): INR, PROTIME in the last 168 hours. Cardiac Enzymes:  Recent Labs Lab 06/06/2016 2111 06/12/16 1342 06/12/16 1530  CKTOTAL  --   --  38*  TROPONINI 0.06* 0.38*  --    BNP (last 3 results) No results for input(s): PROBNP in the last 8760 hours. HbA1C: No results for input(s): HGBA1C in the last 72 hours. CBG:  Recent Labs Lab 06/12/16 0749 06/12/16 0818 06/12/16 1111 06/12/16 1729 06/12/16 2102  GLUCAP 44* 81 131* 140* 151*   Lipid Profile: No results for input(s): CHOL, HDL, LDLCALC, TRIG, CHOLHDL, LDLDIRECT in the last 72 hours. Thyroid Function Tests:  Recent Labs  06/10/16 1412  TSH 1.382   Anemia Panel:  Recent Labs  06/10/16 1412  VITAMINB12 231  FOLATE 8.4   Sepsis Labs: No results for input(s): PROCALCITON, LATICACIDVEN in the last 168 hours.  Recent Results (from the past 240 hour(s))  MRSA PCR Screening     Status: None   Collection Time: 06/12/16  2:00 PM  Result Value Ref Range Status   MRSA by PCR NEGATIVE NEGATIVE Final    Comment:        The GeneXpert MRSA Assay (FDA approved for NASAL specimens only), is one component of a comprehensive MRSA colonization surveillance program. It is not intended to diagnose MRSA infection nor to guide or monitor treatment for MRSA infections.          Radiology Studies: US Renal  Result Date: 06/12/2016 CLINICAL DATA:  83 year old male with acute tubular necrosis. Initial encounter. EXAM: RENAL / URINARY TRACT ULTRASOUND COMPLETE COMPARISON:  Noncontrast CT Abdomen and Pelvis 12/15/2013 FINDINGS: Right Kidney: Length: 10.4 cm. Thinning of the renal cortex. Cortical echogenicity at the upper limits of normal to mildly increased (image 9). No hydronephrosis or right renal mass. Left Kidney: Length: 10.6 cm. Better preserved  renal cortex, although cortical echogenicity appears to be similar to the right side at the upper limits of normal to mildly increased. Suspected 7-8 mm lower pole calculus (image 36). No left hydronephrosis or renal mass. Bladder: Appears normal for degree of bladder distention. Other findings: Moderate volume perihepatic ascites with nodular liver contour (image 69). Small volume pelvic ascites (image 63). IMPRESSION: 1. No acute renal findings. Renal cortical atrophy and possible chronic medical renal disease. Suspected nonobstructing left lower pole renal calculus. 2. Cirrhotic appearing liver with small to moderate volume ascites. Electronically Signed   By: Althea Grimmer.D.  On: 06/12/2016 15:35   [place] Art/ven Flow Abd Pelv Doppler  Result Date: 06/12/2016 CLINICAL DATA:  Elevated LFTs.  Previous cholecystectomy. EXAM: LIMITED RIGHT UPPER QUADRANT ABDOMINAL ULTRASOUND US HEPATIC DOPPLER ARTERIAL/VENOUS ULTRASOUND COMPARISON:  CT 12/15/2013 FINDINGS: Gallbladder:  Surgically absent Common bile duct:  Normal in caliber, 6mm diameter. Liver: Homogeneous in echotexture without focal lesion or intrahepatic bile duct dilatation. Portal Vein 5 mm diameter.  Velocities (all hepatopetal): Main:  30-44 cm/sec Right: 26 cm/sec Left:  22 cm/sec Hepatic Vein Velocities (all hepatofugal): Right:  53 cm/sec Middle:  73 cm/sec Left:  59 cm/sec Hepatic Artery Velocity: 56 cm/sec IVC:  48 cm/ sec intrahepatic segment Ascites: Present Right pleural effusion is also noted. IMPRESSION: 1.  Normal hepatic vascular Doppler evaluation. 2. Abdominal ascites and right pleural effusion. Electronically Signed   By: Corlis Leak M.D.   On: 06/12/2016 16:49   Dg Chest Port 1 View  Result Date: 06/12/2016 CLINICAL DATA:  CHF, COPD, former smoker. Patient reports shortness of breath. EXAM: PORTABLE CHEST 1 VIEW COMPARISON:  PA and lateral chest x-ray of 06-23-2016 FINDINGS: The lungs are less well inflated today. The interstitial markings  are increased. An area of alveolar opacity is present in the right lower lung. There is no pleural effusion or pneumothorax. The cardiac silhouette is enlarged and the pulmonary vascularity is engorged. The ICD is in stable position. There is calcification in the wall of the aortic arch. IMPRESSION: Interval development of pulmonary interstitial edema secondary to CHF. Patchy alveolar density at the right lung base likely reflects early alveolar edema. Electronically Signed   By: David  [place] M.D.   On: 06/12/2016 16:22   US Abdomen Limited Ruq  Result Date: 06/12/2016 CLINICAL DATA:  Elevated LFTs.  Previous cholecystectomy. EXAM: LIMITED RIGHT UPPER QUADRANT ABDOMINAL ULTRASOUND US HEPATIC DOPPLER ARTERIAL/VENOUS ULTRASOUND COMPARISON:  CT 12/15/2013 FINDINGS: Gallbladder:  Surgically absent Common bile duct:  Normal in caliber, 6mm diameter. Liver: Homogeneous in echotexture without focal lesion or intrahepatic bile duct dilatation. Portal Vein 5 mm diameter.  Velocities (all hepatopetal): Main:  30-44 cm/sec Right: 26 cm/sec Left:  22 cm/sec Hepatic Vein Velocities (all hepatofugal): Right:  53 cm/sec Middle:  73 cm/sec Left:  59 cm/sec Hepatic Artery Velocity: 56 cm/sec IVC:  48 cm/ sec intrahepatic segment Ascites: Present Right pleural effusion is also noted. IMPRESSION: 1.  Normal hepatic vascular Doppler evaluation. 2. Abdominal ascites and right pleural effusion. Electronically Signed   By: Corlis Leak M.D.   On: 06/12/2016 16:49        Scheduled Meds: . aspirin EC  81 mg Oral Daily  . cholecalciferol  5,000 Units Oral Daily  . heparin  5,000 Units Subcutaneous Q8H  . insulin aspart  0-5 Units Subcutaneous QHS  . insulin aspart  0-9 Units Subcutaneous TID WC  . isosorbide mononitrate  15 mg Oral QHS  . magnesium oxide  400 mg Oral BID  . sodium chloride flush  3 mL Intravenous Q12H   Continuous Infusions: . sodium chloride 80 mL/hr at 06/13/16 0439  . DOBUTamine 2 mcg/kg/min (06/12/16  1654)     LOS: 4 days     Jacquelin Hawking [facility] 06/13/2016, 8:03 AM Pager: [phone]  If 7PM-7AM, please contact night-coverage [email or web address] Password [email or web address] 06/13/2016, 8:03 AM

## 2016-06-13 NOTE — Procedures (Signed)
Intubation Procedure Note BURCH BREITKREUTZ [id number] 1934-08-07  Procedure: Intubation Indications: Airway protection and maintenance  Procedure Details Consent: Risks of procedure as well as the alternatives and risks of each were explained to the (patient/caregiver).  Consent for procedure obtained. Time Out: Verified patient identification, verified procedure, site/side was marked, verified correct patient position, special equipment/implants available, medications/allergies/relevent history reviewed, required imaging and test results available.  Performed  Maximum sterile technique was used including gloves and mask.  3  Evaluation Hemodynamic Status: BP stable throughout; O2 sats: stable throughout Patient's Current Condition: stable Complications: No apparent complications Patient did tolerate procedure well. Chest X-ray ordered to verify placement.  CXR: pending.  Given 50 Rocuronium            20 Etomidate            2 Versed            100 Fentanyl   While visualizing cords it was noted that patient had thick brown secretions in oral cavity and along entry to cords.   Jovita Kussmaul, AG-ACNP [facility]  Pgr: [phone]  PCCM Pgr: [phone]

## 2016-06-13 NOTE — Procedures (Signed)
Central Venous Catheter Insertion Procedure Note [name] [id number] [birth year]  Procedure: Insertion of Central Venous Catheter Indications: Assessment of intravascular volume, Drug and/or fluid administration and Frequent blood sampling  Procedure Details Consent: Risks of procedure as well as the alternatives and risks of each were explained to the (patient/caregiver).  Consent for procedure obtained. Time Out: Verified patient identification, verified procedure, site/side was marked, verified correct patient position, special equipment/implants available, medications/allergies/relevent history reviewed, required imaging and test results available.  Performed  Maximum sterile technique was used including antiseptics, cap, gloves, gown, hand hygiene, mask and sheet. Skin prep: Chlorhexidine; local anesthetic administered A antimicrobial bonded/coated triple lumen catheter was placed in the left internal jugular vein using the Seldinger technique.  Evaluation Blood flow good Complications: No apparent complications Patient did tolerate procedure well. Chest X-ray ordered to verify placement.  CXR: pending.  Procedure performed under direct ultrasound guidance for real time vessel cannulation.      Rutherford Guys, [place] [facility] Pager: [phone]  or [phone] 06/13/2016, 7:29 PM   '

## 2016-06-13 NOTE — Care Management Note (Signed)
Case Management Note  Patient Details  Name: STYLIANOS HAYES MRN: [id number] Date of Birth: 1934/08/01  Subjective/Objective:    Pt lives with wife, is currently unresponsive.  Son states pt was previously independent but has recently had frequent falls.  States pt is not on home O2.               Status of Service:  In process, will continue to follow  Magdalene River, RN 06/13/2016, 3:39 PM

## 2016-06-13 NOTE — Progress Notes (Signed)
Called to bedside emergently due to large lt pneumothorax BP unstable, sats in 80s  Blood pressure 110/77, pulse (!) 101, temperature 98 F (36.7 C), temperature source Oral, resp. rate 14, height 5\' 4"  (1.626 m), weight 164 lb 10.9 oz (74.7 kg), SpO2 94 %. Gen:      No acute distress HEENT:  EOMI, sclera anicteric Neck:     No masses; no thyromegaly Lungs:    Absent breath sounds on left; normal respiratory effort CV:         Regular rate and rhythm; no murmurs Abd:      + bowel sounds; soft, non-tender; no palpable masses, no distension Ext:    No edema; adequate peripheral perfusion Skin:      Warm and dry; no rash Neuro: Intubated, unresponsive.  Assessment/Plan Large lt ptx with tension. Likely from positive pressure ventilation.  Emergent placement of Lt chest tube Repeat CXR shows expansion of lung Place CT to suction.  Follow CXR, ABG Continuing FFP for INR reversal Will need central line tonight.  Family updated.  The patient is critically ill with multiple organ system failure and requires high complexity decision making for assessment and support, frequent evaluation and titration of therapies, advanced monitoring, review of radiographic studies and interpretation of complex data.   Additional critical care time 30 mins.  Chilton Greathouse MD [facility] Pager [phone] If no answer or after 3pm call: [phone] 06/13/2016, 7:16 PM

## 2016-06-13 NOTE — Consult Note (Addendum)
[facility] Consult Note     Date: 06/13/2016                  Patient Name:  [name]  MRN: [id number]  DOB: 08-31-1934  Age / Sex: 82 y.o., male         PCP: Maricela Curet, MD                 Service Requesting Consult: CCM                 Reason for Consult: Acute oliguric kidney injury            Chief Complaint: transfer from [place] for fall, nausea, suicidal thoughts  HPI: Pt is a 86M with ischemic CM with EF 15-20%, extensive cardiac history, ICD placement in 2014, OSA, obesity, DM II , and permanent Afib not on anticoag d/t falls who is now seen in consultation at the request of Dr. Tammi Sou for evaluation and recommendations surrounding AKI.    Briefly, pt was admitted to [facility] 3/5 for falls, hyponatremia, and suicidal thoughts.  Lasix was recently increased to 60 mg q AM 40 mg q PM due to reports of PND on 2/15.    He was given fluids at [facility].    He has decompensated over the past 48 hours and has developed cardiogenic shock, volume overload, shock liver, acute encephalopathy, and acute oliguric kidney injury with metabolic abnormalities.    He has been started on dobutamine.  Lasix has been held.    Past Medical History:  Diagnosis Date  . Arteriosclerotic cardiovascular disease (ASCVD)   . At risk for sudden cardiac death 08-22-2012  . Atrial fibrillation ([place])    Onset in 2012  . Atrial flutter ([place])   . Bleeding in brain due to brain aneurysm [facility]) October 06 2014  . CAD (coronary artery disease)    a. s/p DES to LAD in 1999 b. DES to RCA in 2004 c. NST in 2014 showing scar along LAD territory and borderline ischemia along RCA  . CHF (congestive heart failure) ([place])   . Chronic anticoagulation 2012   2012  . COPD (chronic obstructive pulmonary disease) ([place])   . DJD (degenerative joint disease)   . Gout   . Hyperlipidemia   . ICD (implantable cardiac defibrillator) in place   . Ischemic cardiomyopathy   . Kidney stone    "just  once" (08/17/2012)  . Myocardial infarction 1998  . NICM (nonischemic cardiomyopathy), EF 25-30% 08-22-12  . Obstructive sleep apnea    "went away when I lost a bunch of weight" (08/17/2012)  . Permanent atrial fibrillation ([place]) 02/15/2011   Initial onset in 03/2011 with rapid ventricular response   . S/P ICD (internal cardiac defibrillator) procedure, 08/17/12, [employer] 08-22-2012   [employer]  . Type II diabetes mellitus ([place])     Past Surgical History:  Procedure Laterality Date  . CARDIAC CATHETERIZATION  02/2003  . CARDIAC DEFIBRILLATOR PLACEMENT  08/17/2012   [employer]  . CATARACT EXTRACTION W/ INTRAOCULAR LENS  IMPLANT, BILATERAL Bilateral ~ 2011  . CHOLECYSTECTOMY  2009  . CORONARY ANGIOPLASTY WITH STENT PLACEMENT  07/14/1996   "1" (08/17/2012)  . CYSTOSCOPY/RETROGRADE/URETEROSCOPY  06/28/2011   Procedure: CYSTOSCOPY/RETROGRADE/URETEROSCOPY;  Surgeon: Marissa Nestle, MD;  Location: [facility];  Service: Urology;  Laterality: Right;  . IMPLANTABLE CARDIOVERTER DEFIBRILLATOR IMPLANT N/A 08/17/2012   Procedure: IMPLANTABLE CARDIOVERTER DEFIBRILLATOR IMPLANT;  Surgeon: Sanda Klein, MD;  Location: [facility] CATH  LAB;  Service: Cardiovascular;  Laterality: N/A;  . KNEE ARTHROPLASTY Left 1978   "tendon & cartilege repair" (08/17/2012)  . Myoview perfusion scan  06/02/2012   low risk, extensive scar entire LAD & RCA territory  . s/p icd  08/2012   [employer]  . STONE EXTRACTION WITH BASKET  06/28/2011   Procedure: STONE EXTRACTION WITH BASKET;  Surgeon: Marissa Nestle, MD;  Location: [facility] ORS;  Service: Urology;  Laterality: Right;  specimen given to family per MD  . US ECHOCARDIOGRAPHY  07/08/2012   EF <20%,mild MR,TR,LA severely dilated  . VASECTOMY  ~ 1964    Family History  Problem Relation Age of Onset  . Heart failure Mother   . Heart failure Father    Social History:  reports that he quit smoking about 24 years ago. His smoking use included Cigarettes. He has a  80.00 pack-year smoking history. He has quit using smokeless tobacco. He reports that he does not drink alcohol or use drugs.  Allergies:  Allergies  Allergen Reactions  . Novocain [Procaine] Nausea And Vomiting and Other (See Comments)    Passes out (also)  . Lipitor [Atorvastatin] Other (See Comments)    Myalgias    . Procaine Hcl Nausea And Vomiting  . Tramadol Itching and Nausea And Vomiting    Medications Prior to Admission  Medication Sig Dispense Refill  . aspirin EC 81 MG tablet Take 81 mg by mouth daily.    . Cholecalciferol (VITAMIN D3) 5000 units CAPS Take 1 capsule by mouth daily.    . digoxin (LANOXIN) 0.25 MG tablet Take 1/2 tablet daily (Patient taking differently: Take 0.125 mg by mouth daily. Take 1/2 tablet daily) 30 tablet 11  . furosemide (LASIX) 40 MG tablet Take 1.5 tablets in the morning and 1/2 tablet @ 4 (Patient taking differently: Take 20-60 mg by mouth 2 (two) times daily. Take 1.5 tablets in the morning and 1/2 tablet @ 4) 60 tablet 11  . isosorbide mononitrate (IMDUR) 30 MG 24 hr tablet Take 0.5 tablets (15 mg total) by mouth at bedtime. 30 tablet 1  . LamoTRIgine XR 200 MG TB24 Take one tablet at bedtime. (Patient taking differently: Take 200 mg by mouth at bedtime. Take one tablet at bedtime.) 90 tablet 1  . magnesium oxide (MAG-OX) 400 MG tablet Take 1 tablet (400 mg total) by mouth 2 (two) times daily. 60 tablet 11  . metFORMIN (GLUCOPHAGE) 500 MG tablet Take 500 mg by mouth 2 (two) times daily with a meal.     . potassium chloride (K-DUR) 10 MEQ tablet Take 1 tablet (10 mEq total) by mouth daily. 30 tablet 11  . pravastatin (PRAVACHOL) 40 MG tablet Take 1 tablet (40 mg total) by mouth daily. 30 tablet 11  . lamoTRIgine (LAMICTAL) 25 MG tablet 1 tablet twice a day for 7 days, then 2 tablets twice a day for 7 days, then 3 tablets twice a day for 7 days, then Start new prescription for lamotrigine XR '200mg'$ , one tablet at bedtime. (Patient not taking:  Reported on 07/02/2016) 84 tablet 0    Results for orders placed or performed during the hospital encounter of 06/20/2016 (from the past 48 hour(s))  Magnesium     Status: None   Collection Time: 06/11/16  2:15 PM  Result Value Ref Range   Magnesium 2.4 1.7 - 2.4 mg/dL  Glucose, capillary     Status: Abnormal   Collection Time: 06/11/16  4:30 PM  Result Value Ref  Range   Glucose-Capillary 158 (H) 65 - 99 mg/dL   Comment 1 Notify RN    Comment 2 Document in Chart   Glucose, capillary     Status: None   Collection Time: 06/11/16  8:50 PM  Result Value Ref Range   Glucose-Capillary 97 65 - 99 mg/dL  Basic metabolic panel     Status: Abnormal   Collection Time: 06/12/16  5:43 AM  Result Value Ref Range   Sodium 131 (L) 135 - 145 mmol/L   Potassium 5.6 (H) 3.5 - 5.1 mmol/L   Chloride 89 (L) 101 - 111 mmol/L   CO2 13 (L) 22 - 32 mmol/L   Glucose, Bld 48 (L) 65 - 99 mg/dL   BUN 51 (H) 6 - 20 mg/dL   Creatinine, Ser 3.41 (H) 0.61 - 1.24 mg/dL    Comment: DELTA CHECK NOTED   Calcium 10.2 8.9 - 10.3 mg/dL   GFR calc non Af Amer 16 (L) >60 mL/min   GFR calc Af Amer 18 (L) >60 mL/min    Comment: (NOTE) The eGFR has been calculated using the CKD EPI equation. This calculation has not been validated in all clinical situations. eGFR's persistently <60 mL/min signify possible Chronic Kidney Disease.   Hepatic function panel     Status: Abnormal   Collection Time: 06/12/16  5:43 AM  Result Value Ref Range   Total Protein 7.1 6.5 - 8.1 g/dL   Albumin 4.4 3.5 - 5.0 g/dL   AST 3,202 (H) 15 - 41 U/L    Comment: RESULTS CONFIRMED BY MANUAL DILUTION   ALT 1,131 (H) 17 - 63 U/L   Alkaline Phosphatase 97 38 - 126 U/L   Total Bilirubin 4.1 (H) 0.3 - 1.2 mg/dL   Bilirubin, Direct 1.9 (H) 0.1 - 0.5 mg/dL   Indirect Bilirubin 2.2 (H) 0.3 - 0.9 mg/dL  Glucose, capillary     Status: Abnormal   Collection Time: 06/12/16  7:49 AM  Result Value Ref Range   Glucose-Capillary 44 (LL) 65 - 99 mg/dL    Comment 1 Notify RN    Comment 2 Document in Chart   Glucose, capillary     Status: None   Collection Time: 06/12/16  8:18 AM  Result Value Ref Range   Glucose-Capillary 81 65 - 99 mg/dL   Comment 1 Notify RN    Comment 2 Document in Chart   Glucose, capillary     Status: Abnormal   Collection Time: 06/12/16 11:11 AM  Result Value Ref Range   Glucose-Capillary 131 (H) 65 - 99 mg/dL   Comment 1 Notify RN    Comment 2 Document in Chart   Hepatic function panel     Status: Abnormal   Collection Time: 06/12/16  1:42 PM  Result Value Ref Range   Total Protein 6.9 6.5 - 8.1 g/dL   Albumin 4.3 3.5 - 5.0 g/dL   AST 4,975 (H) 15 - 41 U/L    Comment: RESULTS CONFIRMED BY MANUAL DILUTION   ALT 1,803 (H) 17 - 63 U/L   Alkaline Phosphatase 99 38 - 126 U/L   Total Bilirubin 3.8 (H) 0.3 - 1.2 mg/dL   Bilirubin, Direct 1.8 (H) 0.1 - 0.5 mg/dL   Indirect Bilirubin 2.0 (H) 0.3 - 0.9 mg/dL  Ammonia     Status: Abnormal   Collection Time: 06/12/16  1:42 PM  Result Value Ref Range   Ammonia 45 (H) 9 - 35 umol/L  Troponin I (q 6hr x 3)  Status: Abnormal   Collection Time: 06/12/16  1:42 PM  Result Value Ref Range   Troponin I 0.38 (HH) <0.03 ng/mL    Comment: CRITICAL RESULT CALLED TO, READ BACK BY AND VERIFIED WITH: LEE,B AT 1530 ON 06/12/2016 BY ISLEY,B   Brain natriuretic peptide     Status: Abnormal   Collection Time: 06/12/16  1:43 PM  Result Value Ref Range   B Natriuretic Peptide 2,370.0 (H) 0.0 - 100.0 pg/mL  MRSA PCR Screening     Status: None   Collection Time: 06/12/16  2:00 PM  Result Value Ref Range   MRSA by PCR NEGATIVE NEGATIVE    Comment:        The GeneXpert MRSA Assay (FDA approved for NASAL specimens only), is one component of a comprehensive MRSA colonization surveillance program. It is not intended to diagnose MRSA infection nor to guide or monitor treatment for MRSA infections.   Acetaminophen level     Status: Abnormal   Collection Time: 06/12/16  2:37 PM   Result Value Ref Range   Acetaminophen (Tylenol), Serum <10 (L) 10 - 30 ug/mL    Comment:        THERAPEUTIC CONCENTRATIONS VARY SIGNIFICANTLY. A RANGE OF 10-30 ug/mL MAY BE AN EFFECTIVE CONCENTRATION FOR MANY PATIENTS. HOWEVER, SOME ARE BEST TREATED AT CONCENTRATIONS OUTSIDE THIS RANGE. ACETAMINOPHEN CONCENTRATIONS >150 ug/mL AT 4 HOURS AFTER INGESTION AND >50 ug/mL AT 12 HOURS AFTER INGESTION ARE OFTEN ASSOCIATED WITH TOXIC REACTIONS.   Hepatitis panel, acute     Status: None   Collection Time: 06/12/16  2:37 PM  Result Value Ref Range   Hepatitis B Surface Ag Negative Negative   HCV Ab <0.1 0.0 - 0.9 s/co ratio    Comment: (NOTE)                                  Negative:     < 0.8                             Indeterminate: 0.8 - 0.9                                  Positive:     > 0.9 The CDC recommends that a positive HCV antibody result be followed up with a HCV Nucleic Acid Amplification test (834196). Performed At: [facility] [place], [place] [id number] Lindon Romp MD JJ:[id number]    Hep A IgM Negative Negative   Hep B C IgM Negative Negative  CK     Status: Abnormal   Collection Time: 06/12/16  3:30 PM  Result Value Ref Range   Total CK 38 (L) 49 - 397 U/L  Sodium, urine, random     Status: None   Collection Time: 06/12/16  5:00 PM  Result Value Ref Range   Sodium, Ur <10 mmol/L  Creatinine, urine, random     Status: None   Collection Time: 06/12/16  5:00 PM  Result Value Ref Range   Creatinine, Urine 95.04 mg/dL  Glucose, capillary     Status: Abnormal   Collection Time: 06/12/16  5:29 PM  Result Value Ref Range   Glucose-Capillary 140 (H) 65 - 99 mg/dL  Blood gas, arterial     Status: Abnormal   Collection Time: 06/12/16  5:30 PM  Result Value Ref Range   O2 Content 5.0 L/min   Delivery systems NASAL CANNULA    pH, Arterial 7.347 (L) 7.350 - 7.450   pCO2 arterial 25.8 (L) 32.0 - 48.0 mmHg   pO2, Arterial 90.0 83.0 -  108.0 mmHg   Bicarbonate 16.7 (L) 20.0 - 28.0 mmol/L   Acid-base deficit 10.8 (H) 0.0 - 2.0 mmol/L   O2 Saturation 95.1 %   Collection site LEFT RADIAL    Drawn by [id number]    Sample type ARTERIAL    Allens test (pass/fail) PASS PASS  Glucose, capillary     Status: Abnormal   Collection Time: 06/12/16  9:02 PM  Result Value Ref Range   Glucose-Capillary 151 (H) 65 - 99 mg/dL   Comment 1 Notify RN   Glucose, capillary     Status: Abnormal   Collection Time: 06/13/16  8:24 AM  Result Value Ref Range   Glucose-Capillary 156 (H) 65 - 99 mg/dL  Comprehensive metabolic panel     Status: Abnormal   Collection Time: 06/13/16  8:33 AM  Result Value Ref Range   Sodium 123 (L) 135 - 145 mmol/L   Potassium 6.7 (HH) 3.5 - 5.1 mmol/L    Comment: HEMOLYSIS AT THIS LEVEL MAY AFFECT RESULT Tonita Cong [id number] WILDERK    Chloride 89 (L) 101 - 111 mmol/L   CO2 15 (L) 22 - 32 mmol/L   Glucose, Bld 148 (H) 65 - 99 mg/dL   BUN 71 (H) 6 - 20 mg/dL   Creatinine, Ser 3.90 (H) 0.61 - 1.24 mg/dL   Calcium 9.0 8.9 - 10.3 mg/dL   Total Protein 6.2 (L) 6.5 - 8.1 g/dL   Albumin 4.0 3.5 - 5.0 g/dL   AST 2,378 (H) 15 - 41 U/L   ALT 1,567 (H) 17 - 63 U/L   Alkaline Phosphatase 96 38 - 126 U/L   Total Bilirubin 3.5 (H) 0.3 - 1.2 mg/dL   GFR calc non Af Amer 13 (L) >60 mL/min   GFR calc Af Amer 16 (L) >60 mL/min    Comment: (NOTE) The eGFR has been calculated using the CKD EPI equation. This calculation has not been validated in all clinical situations. eGFR's persistently <60 mL/min signify possible Chronic Kidney Disease.    Anion gap 19 (H) 5 - 15  Protime-INR     Status: Abnormal   Collection Time: 06/13/16  8:33 AM  Result Value Ref Range   Prothrombin Time 31.3 (H) 11.4 - 15.2 seconds   INR 2.94   Ammonia     Status: Abnormal   Collection Time: 06/13/16 10:10 AM  Result Value Ref Range   Ammonia 38 (H) 9 - 35 umol/L  Troponin I (q 6hr x 3)     Status: Abnormal   Collection Time:  06/13/16 10:10 AM  Result Value Ref Range   Troponin I 0.25 (HH) <0.03 ng/mL    Comment: CRITICAL RESULT CALLED TO, READ BACK BY AND VERIFIED WITH: M.BAILEY,RN 1104 06/13/16 TYSOR,T   Basic metabolic panel     Status: Abnormal   Collection Time: 06/13/16 10:10 AM  Result Value Ref Range   Sodium 125 (L) 135 - 145 mmol/L   Potassium 6.3 (HH) 3.5 - 5.1 mmol/L    Comment: NO VISIBLE HEMOLYSIS CRITICAL RESULT CALLED TO, READ BACK BY AND VERIFIED WITH: M.BAILEY,RN 1056 06/13/16 TYSOR,T    Chloride 89 (L) 101 - 111 mmol/L   CO2 16 (L) 22 - 32 mmol/L  Glucose, Bld 152 (H) 65 - 99 mg/dL   BUN 70 (H) 6 - 20 mg/dL   Creatinine, Ser 3.89 (H) 0.61 - 1.24 mg/dL   Calcium 9.2 8.9 - 10.3 mg/dL   GFR calc non Af Amer 13 (L) >60 mL/min   GFR calc Af Amer 16 (L) >60 mL/min    Comment: (NOTE) The eGFR has been calculated using the CKD EPI equation. This calculation has not been validated in all clinical situations. eGFR's persistently <60 mL/min signify possible Chronic Kidney Disease.    Anion gap 20 (H) 5 - 15  Glucose, capillary     Status: Abnormal   Collection Time: 06/13/16 11:13 AM  Result Value Ref Range   Glucose-Capillary 142 (H) 65 - 99 mg/dL   US Renal  Result Date: 06/12/2016 CLINICAL DATA:  82 year old male with acute tubular necrosis. Initial encounter. EXAM: RENAL / URINARY TRACT ULTRASOUND COMPLETE COMPARISON:  Noncontrast CT Abdomen and Pelvis 12/15/2013 FINDINGS: Right Kidney: Length: 10.4 cm. Thinning of the renal cortex. Cortical echogenicity at the upper limits of normal to mildly increased (image 9). No hydronephrosis or right renal mass. Left Kidney: Length: 10.6 cm. Better preserved renal cortex, although cortical echogenicity appears to be similar to the right side at the upper limits of normal to mildly increased. Suspected 7-8 mm lower pole calculus (image 36). No left hydronephrosis or renal mass. Bladder: Appears normal for degree of bladder distention. Other findings:  Moderate volume perihepatic ascites with nodular liver contour (image 69). Small volume pelvic ascites (image 63). IMPRESSION: 1. No acute renal findings. Renal cortical atrophy and possible chronic medical renal disease. Suspected nonobstructing left lower pole renal calculus. 2. Cirrhotic appearing liver with small to moderate volume ascites. Electronically Signed   By: Genevie Ann M.D.   On: 06/12/2016 15:35   [place] Art/ven Flow Abd Pelv Doppler  Result Date: 06/12/2016 CLINICAL DATA:  Elevated LFTs.  Previous cholecystectomy. EXAM: LIMITED RIGHT UPPER QUADRANT ABDOMINAL ULTRASOUND US HEPATIC DOPPLER ARTERIAL/VENOUS ULTRASOUND COMPARISON:  CT 12/15/2013 FINDINGS: Gallbladder:  Surgically absent Common bile duct:  Normal in caliber, 43m diameter. Liver: Homogeneous in echotexture without focal lesion or intrahepatic bile duct dilatation. Portal Vein 5 mm diameter.  Velocities (all hepatopetal): Main:  30-44 cm/sec Right: 26 cm/sec Left:  22 cm/sec Hepatic Vein Velocities (all hepatofugal): Right:  53 cm/sec Middle:  73 cm/sec Left:  59 cm/sec Hepatic Artery Velocity: 56 cm/sec IVC:  48 cm/ sec intrahepatic segment Ascites: Present Right pleural effusion is also noted. IMPRESSION: 1.  Normal hepatic vascular Doppler evaluation. 2. Abdominal ascites and right pleural effusion. Electronically Signed   By: DLucrezia EuropeM.D.   On: 06/12/2016 16:49   Dg Chest Port 1 View  Result Date: 06/12/2016 CLINICAL DATA:  CHF, COPD, former smoker. Patient reports shortness of breath. EXAM: PORTABLE CHEST 1 VIEW COMPARISON:  PA and lateral chest x-ray of June 09, 2016 FINDINGS: The lungs are less well inflated today. The interstitial markings are increased. An area of alveolar opacity is present in the right lower lung. There is no pleural effusion or pneumothorax. The cardiac silhouette is enlarged and the pulmonary vascularity is engorged. The ICD is in stable position. There is calcification in the wall of the aortic arch.  IMPRESSION: Interval development of pulmonary interstitial edema secondary to CHF. Patchy alveolar density at the right lung base likely reflects early alveolar edema. Electronically Signed   By: David  JMartiniqueM.D.   On: 06/12/2016 16:22   [place] Limited Ruq  Result Date: 06/12/2016 CLINICAL DATA:  Elevated LFTs.  Previous cholecystectomy. EXAM: LIMITED RIGHT UPPER QUADRANT ABDOMINAL ULTRASOUND US HEPATIC DOPPLER ARTERIAL/VENOUS ULTRASOUND COMPARISON:  CT 12/15/2013 FINDINGS: Gallbladder:  Surgically absent Common bile duct:  Normal in caliber, 22m diameter. Liver: Homogeneous in echotexture without focal lesion or intrahepatic bile duct dilatation. Portal Vein 5 mm diameter.  Velocities (all hepatopetal): Main:  30-44 cm/sec Right: 26 cm/sec Left:  22 cm/sec Hepatic Vein Velocities (all hepatofugal): Right:  53 cm/sec Middle:  73 cm/sec Left:  59 cm/sec Hepatic Artery Velocity: 56 cm/sec IVC:  48 cm/ sec intrahepatic segment Ascites: Present Right pleural effusion is also noted. IMPRESSION: 1.  Normal hepatic vascular Doppler evaluation. 2. Abdominal ascites and right pleural effusion. Electronically Signed   By: DLucrezia EuropeM.D.   On: 06/12/2016 16:49    ROS: unobtainable due to mental status  Blood pressure 107/90, pulse 100, temperature (!) 96.8 F (36 C), temperature source Axillary, resp. rate 14, height '5\' 4"'$  (1.626 m), weight 74.7 kg (164 lb 10.9 oz), SpO2 100 %. Physical Exam  GEN appears ill, family at bedside HEENT opens eyes occasionally, sclerae minimally icteric NECK + JVD PULM coarse breath sounds bilaterally CV irregular, II/VI systolic murmur, + S3 ABD distended, liver edge palpable 3-4 cm below costal margin EXT 2+ LE edema NEURO obtunded  Assessment/Plan  1.  Acute systolic heart failure exacerbation with cardiogenic shock: on dobutamine, heart failure team involved.  EF 15-20%.  He is volume overloaded.  We will attempt high dose Lasix 120 IV q 8 hrs in an attempt to  correct hypervolemia.  2.  Acute oliguric kidney injury: due to #1 as above.  Lasix as above.  He is a poor candidate for dialytic intervention; I have discussed this with his family present at bedside (however wife wasn't present).  We may have to seriously consider whether or not to attempt dialysis if aggressive diuresis is not successful.  3.  Hyperkalemia: hopefully will be mitigated by lasix, have also ordered Veltassa (patiromer; will need NG tube) and receiving insulin.   BID BMPs  4.  Shock liver/ coagulopathy: INR will need to be corrected before central access placement.  5.  Mixed AG and nonanion gap Metabolic acidosis: AKI + previous fluid resuscitation, ? Lactate.  May need bicarb after NGT placement  6.  Hyponatremia: due to vol overload; expect to improve if we can achieve diuresis.   7.  Suicidal ideation: possible drug effect of lamictal.  Rec stopping.     [facility] 06/13/2016, 2:05 PM

## 2016-06-13 NOTE — Progress Notes (Signed)
PT Cancellation Note  Patient Details Name: [name] MRN: [id number] DOB: 12/22/1934   Cancelled Treatment:    Reason Eval/Treat Not Completed: Medical issues which prohibited therapy. Pt's RN requesting that therapy hold for today. PT will continue to f/u with pt as available and appropriate.  Alessandra Bevels [place] Behlke 06/13/2016, 3:04 PM

## 2016-06-13 NOTE — Consult Note (Signed)
Advanced Heart Failure Team Consult Note  Referring Physician: Dr Konrad Dolores Primary Physician: Dr Janna Arch  Primary Cardiologist:  Dr [name]   Reason for Consultation: Heart Failure   HPI:   [name] is a 82 year old with history of ICM, 1998 Anterior MI,  1999 stent to LAD and PTCA to circumflex, 2004 stent RCA, ICH after fall 2014, ICD 2014, HTN, HLD, obesity, OSA, and permanent A fib (not on anticoagulants due to falls).   Last saw [name] 05/23/16 complaining of PND. Lasix increased 60 mg am 40 pm, dig decreased.    Prior to admit he was started on lamotrigine by neurology. Developed nausea .   Admitted [facility] after fall, nausea, and suicidal thoughts. CT of head was negative. Pertinent admission labs included sodium 129, K 4.4, Creatinine 1.59, BNP 1879, AST 25, ALT 16.  LFTs dramatic rise over the last 48 hours. GI consulted. Elevated LFTs thought to be from hepatic injury. Abd [place] with abdominal ascites and R pleural effusion. Renal US no acute findings. Nephrology consulted 3/7 and started started 1/2 NS with bicarb. Cardiology consulted with recommendations to stop statin, stop diuretics, and started on dobutamine at 2 mcg.  Minimal urine output.   Opens eyes on command. Lethargic.   AST 25>4975>pending ALT 16>1803> pending  Creatinine 2.24> 3.4>pending Ammonia 45>pending   Abdomen US 06/12/16 IMPRESSION: 1.  Normal hepatic vascular Doppler evaluation. 2. Abdominal ascites and right pleural effusion   Renal US 06/12/2016 1. No acute renal findings. Renal cortical atrophy and possible chronic medical renal disease. Suspected nonobstructing left lower pole renal calculus. 2. Cirrhotic appearing liver with small to moderate volume ascites.  ECHO 06/10/2016 EF 15-20% RV moderately dilated with mild to moderately decreased systolic function.   Review of Systems: [y] = yes, [ ]  = no  Obtained history from chart. He is lethargic.  General: Weight gain [ ] ; Weight loss [ ] ; Anorexia [  ]; Fatigue [ ] ; Fever [ ] ; Chills [ ] ; Weakness [ ]   Cardiac: Chest pain/pressure [ ] ; Resting SOB [ ] ; Exertional SOB [ ] ; Orthopnea [ ] ; Pedal Edema [ ] ; Palpitations [ ] ; Syncope [ ] ; Presyncope [ ] ; Paroxysmal nocturnal dyspnea[ ]   Pulmonary: Cough [ ] ; Wheezing[ ] ; Hemoptysis[ ] ; Sputum [ ] ; Snoring [ ]   GI: Vomiting[ ] ; Dysphagia[ ] ; Melena[ ] ; Hematochezia [ ] ; Heartburn[ ] ; Abdominal pain [ ] ; Constipation [ ] ; Diarrhea [ ] ; BRBPR [ ]   GU: Hematuria[ ] ; Dysuria [ ] ; Nocturia[ ]   Vascular: Pain in legs with walking [ ] ; Pain in feet with lying flat [ ] ; Non-healing sores [ ] ; Stroke [ ] ; TIA [ ] ; Slurred speech [ ] ;  Neuro: Headaches[ ] ; Vertigo[ ] ; Seizures[ ] ; Paresthesias[ ] ;Blurred vision [ ] ; Diplopia [ ] ; Vision changes [ ]   Ortho/Skin: Arthritis [ ] ; Joint pain [ ] ; Muscle pain [ ] ; Joint swelling [ ] ; Back Pain [ ] ; Rash [ ]   Psych: Depression[Y ]; Anxiety[ ]   Heme: Bleeding problems [ ] ; Clotting disorders [ ] ; Anemia [ ]   Endocrine: Diabetes [ Y]; Thyroid dysfunction[ ]   Home Medications Prior to Admission medications   Medication Sig Start Date End Date Taking? Authorizing Provider  aspirin EC 81 MG tablet Take 81 mg by mouth daily.   Yes Historical Provider, MD  Cholecalciferol (VITAMIN D3) 5000 units CAPS Take 1 capsule by mouth daily.   Yes Historical Provider, MD  digoxin (LANOXIN) 0.25 MG tablet Take 1/2 tablet daily Patient taking differently: Take  0.125 mg by mouth daily. Take 1/2 tablet daily 05/23/16  Yes Lennette Bihari, MD  furosemide (LASIX) 40 MG tablet Take 1.5 tablets in the morning and 1/2 tablet @ 4 Patient taking differently: Take 20-60 mg by mouth 2 (two) times daily. Take 1.5 tablets in the morning and 1/2 tablet @ 4 05/23/16  Yes Lennette Bihari, MD  isosorbide mononitrate (IMDUR) 30 MG 24 hr tablet Take 0.5 tablets (15 mg total) by mouth at bedtime. 05/23/16  Yes Lennette Bihari, MD  LamoTRIgine XR 200 MG TB24 Take one tablet at bedtime. Patient taking  differently: Take 200 mg by mouth at bedtime. Take one tablet at bedtime. 05/29/16  Yes Butch Penny, NP  magnesium oxide (MAG-OX) 400 MG tablet Take 1 tablet (400 mg total) by mouth 2 (two) times daily. 01/15/16  Yes Mihai Croitoru, MD  metFORMIN (GLUCOPHAGE) 500 MG tablet Take 500 mg by mouth 2 (two) times daily with a meal.    Yes Historical Provider, MD  potassium chloride (K-DUR) 10 MEQ tablet Take 1 tablet (10 mEq total) by mouth daily. 01/15/16  Yes Mihai Croitoru, MD  pravastatin (PRAVACHOL) 40 MG tablet Take 1 tablet (40 mg total) by mouth daily. 01/15/16  Yes Mihai Croitoru, MD  lamoTRIgine (LAMICTAL) 25 MG tablet 1 tablet twice a day for 7 days, then 2 tablets twice a day for 7 days, then 3 tablets twice a day for 7 days, then Start new prescription for lamotrigine XR 200mg , one tablet at bedtime. Patient not taking: Reported on 06/25/2016 05/29/16   Butch Penny, NP    Past Medical History: Past Medical History:  Diagnosis Date  . Arteriosclerotic cardiovascular disease (ASCVD)   . At risk for sudden cardiac death 2012-08-28  . Atrial fibrillation (HCC)    Onset in 2012  . Atrial flutter (HCC)   . Bleeding in brain due to brain aneurysm [facility]) October 06 2014  . CAD (coronary artery disease)    a. s/p DES to LAD in 1999 b. DES to RCA in 2004 c. NST in 2014 showing scar along LAD territory and borderline ischemia along RCA  . CHF (congestive heart failure) (HCC)   . Chronic anticoagulation 2012   2012  . COPD (chronic obstructive pulmonary disease) (HCC)   . DJD (degenerative joint disease)   . Gout   . Hyperlipidemia   . ICD (implantable cardiac defibrillator) in place   . Ischemic cardiomyopathy   . Kidney stone    "just once" (08/17/2012)  . Myocardial infarction 1998  . NICM (nonischemic cardiomyopathy), EF 25-30% Aug 28, 2012  . Obstructive sleep apnea    "went away when I lost a bunch of weight" (08/17/2012)  . Permanent atrial fibrillation (HCC) 02/15/2011   Initial onset  in 03/2011 with rapid ventricular response   . S/P ICD (internal cardiac defibrillator) procedure, 08/17/12, [employer] 08-28-2012   [facility]  . Type II diabetes mellitus (HCC)     Past Surgical History: Past Surgical History:  Procedure Laterality Date  . CARDIAC CATHETERIZATION  02/2003  . CARDIAC DEFIBRILLATOR PLACEMENT  08/17/2012   [employer]  . CATARACT EXTRACTION W/ INTRAOCULAR LENS  IMPLANT, BILATERAL Bilateral ~ 2011  . CHOLECYSTECTOMY  2009  . CORONARY ANGIOPLASTY WITH STENT PLACEMENT  07/14/1996   "1" (08/17/2012)  . CYSTOSCOPY/RETROGRADE/URETEROSCOPY  06/28/2011   Procedure: CYSTOSCOPY/RETROGRADE/URETEROSCOPY;  Surgeon: Ky Barban, MD;  Location: [facility];  Service: Urology;  Laterality: Right;  . IMPLANTABLE CARDIOVERTER DEFIBRILLATOR IMPLANT N/A 08/17/2012   Procedure: IMPLANTABLE  CARDIOVERTER DEFIBRILLATOR IMPLANT;  Surgeon: Thurmon Fair, MD;  Location[facility] C[facility] LAB;  Service: Cardiovascular;  Laterality: N/A;  . KNEE ARTHROPLASTY Left 1978   "tendon & cartilege repair" (08/17/2012)  . Myoview perfusion scan  06/02/2012   low risk, extensive scar entire LAD & RCA territory  . s/p icd  08/2012   [facility]  . STONE EXTRACTION WITH BASKET  06/28/2011   Procedure: STONE EXTRACTION WITH BASKET;  Surgeon: Ky Barban, MD;  Location: [facility];  Service: Urology;  Laterality: Right;  specimen given to family per MD  . US ECHOCARDIOGRAPHY  07/08/2012   EF <20%,mild [name],TR,LA severely dilated  . VASECTOMY  ~ 74    Family History: Family History  Problem Relation Age of Onset  . Heart failure Mother   . Heart failure Father     Social History: Social History   Social History  . Marital status: Married    Spouse name: N/A  . Number of children: 3  . Years of education: 9th   Occupational History  . Retired    Social History Main Topics  . Smoking status: Former Smoker    Packs/day: 2.00    Years: 40.00    Types: Cigarettes    Quit date:  04/08/1992  . Smokeless tobacco: Former Neurosurgeon  . Alcohol use No     Comment: 08/17/2012 "quit drinking in 1983"  . Drug use: No  . Sexual activity: No   Other Topics Concern  . None   Social History Narrative   Lives at home with his wife.   Right-handed.   No caffeine use.    Allergies:  Allergies  Allergen Reactions  . Novocain [Procaine] Nausea And Vomiting and Other (See Comments)    Passes out (also)  . Lipitor [Atorvastatin] Other (See Comments)    Myalgias    . Procaine Hcl Nausea And Vomiting  . Tramadol Itching and Nausea And Vomiting    Objective:    Vital Signs:   Temp:  [97 F (36.1 C)-97.6 F (36.4 C)] 97.4 F (36.3 C) (03/08 0432) Pulse Rate:  [49-110] 105 (03/08 0432) Resp:  [13-24] 18 (03/08 0432) BP: (91-127)/(51-103) 99/70 (03/08 0432) SpO2:  [91 %-97 %] 95 % (03/08 0432) Weight:  [161 lb 13.1 oz (73.4 kg)-163 lb 9.3 oz (74.2 kg)] 163 lb 9.3 oz (74.2 kg) (03/07 2251) Last BM Date: 06/11/16  Weight change: Filed Weights   06/10/16 0117 06/12/16 1352 06/12/16 2251  Weight: 152 lb 8.9 oz (69.2 kg) 161 lb 13.1 oz (73.4 kg) 163 lb 9.3 oz (74.2 kg)    Intake/Output:   Intake/Output Summary (Last 24 hours) at 06/13/16 0817 Last data filed at 06/13/16 0520  Gross per 24 hour  Intake          2642.35 ml  Output              250 ml  Net          2392.35 ml     Physical Exam: General:  Eyes closed. In bed HEENT: normal Neck: supple. JVP to jaw . Carotids 2+ bilat; no bruits. No lymphadenopathy or thryomegaly appreciated. Cor: PMI nondisplaced. Irregualr rate & rhythm. No rubs, gallops or murmurs. Lungs: Decreased RML RLL on 4 liters oxygen  Abdomen: soft, nontender, ++ distended. No hepatosplenomegaly. No bruits or masses. Good bowel sounds. Extremities: no cyanosis, clubbing, rash, edema. R and LLE SCDs.  R and L hand mittens  Neuro: Opens eyes. Lethargic.  Telemetry:  A fib 100s   Labs: Basic Metabolic Panel:  Recent Labs Lab  06/28/2016 2111 06/10/16 0455 06/11/16 0511 06/11/16 1415 06/12/16 0543  NA 129* 131* 129*  --  131*  K 4.4 4.4 5.8*  --  5.6*  CL 96* 94* 94*  --  89*  CO2 22 24 18*  --  13*  GLUCOSE 158* 133* 109*  --  48*  BUN 28* 30* 40*  --  51*  CREATININE 1.59* 1.65* 2.24*  --  3.41*  CALCIUM 9.8 9.8 9.9  --  10.2  MG  --   --   --  2.4  --     Liver Function Tests:  Recent Labs Lab 06/30/2016 2111 06/11/16 0511 06/12/16 0543 06/12/16 1342  AST 25 70* 3,202* 4,975*  ALT 16* 37 1,131* 1,803*  ALKPHOS 69 81 97 99  BILITOT 1.8* 2.4* 4.1* 3.8*  PROT 6.5 6.6 7.1 6.9  ALBUMIN 4.0 4.1 4.4 4.3    Recent Labs Lab 06/24/2016 2111  LIPASE 13    Recent Labs Lab 06/11/16 0511 06/12/16 1342  AMMONIA 67* 45*    CBC:  Recent Labs Lab 06/10/2016 2111 06/10/16 1412  WBC 10.4 10.8*  NEUTROABS 7.2  --   HGB 15.8 15.7  HCT 46.5 47.0  MCV 100.2* 99.8  PLT 152 153    Cardiac Enzymes:  Recent Labs Lab 06/23/2016 2111 06/12/16 1342 06/12/16 1530  CKTOTAL  --   --  38*  TROPONINI 0.06* 0.38*  --     BNP: BNP (last 3 results)  Recent Labs  05/30/16 1336 06/18/2016 2111 06/12/16 1343  BNP 1,104.3* 1,879.0* 2,370.0*    ProBNP (last 3 results) No results for input(s): PROBNP in the last 8760 hours.   CBG:  Recent Labs Lab 06/12/16 0749 06/12/16 0818 06/12/16 1111 06/12/16 1729 06/12/16 2102  GLUCAP 44* 81 131* 140* 151*    Coagulation Studies: No results for input(s): LABPROT, INR in the last 72 hours.  Other results: EKG:  A fib 90 bpm   Imaging: US Renal  Result Date: 06/12/2016 CLINICAL DATA:  82 year old male with acute tubular necrosis. Initial encounter. EXAM: RENAL / URINARY TRACT ULTRASOUND COMPLETE COMPARISON:  Noncontrast CT Abdomen and Pelvis 12/15/2013 FINDINGS: Right Kidney: Length: 10.4 cm. Thinning of the renal cortex. Cortical echogenicity at the upper limits of normal to mildly increased (image 9). No hydronephrosis or right renal mass. Left  Kidney: Length: 10.6 cm. Better preserved renal cortex, although cortical echogenicity appears to be similar to the right side at the upper limits of normal to mildly increased. Suspected 7-8 mm lower pole calculus (image 36). No left hydronephrosis or renal mass. Bladder: Appears normal for degree of bladder distention. Other findings: Moderate volume perihepatic ascites with nodular liver contour (image 69). Small volume pelvic ascites (image 63). IMPRESSION: 1. No acute renal findings. Renal cortical atrophy and possible chronic medical renal disease. Suspected nonobstructing left lower pole renal calculus. 2. Cirrhotic appearing liver with small to moderate volume ascites. Electronically Signed   By: Odessa Fleming M.D.   On: 06/12/2016 15:35   [place] Art/ven Flow Abd Pelv Doppler  Result Date: 06/12/2016 CLINICAL DATA:  Elevated LFTs.  Previous cholecystectomy. EXAM: LIMITED RIGHT UPPER QUADRANT ABDOMINAL ULTRASOUND US HEPATIC DOPPLER ARTERIAL/VENOUS ULTRASOUND COMPARISON:  CT 12/15/2013 FINDINGS: Gallbladder:  Surgically absent Common bile duct:  Normal in caliber, 6mm diameter. Liver: Homogeneous in echotexture without focal lesion or intrahepatic bile duct dilatation. Portal Vein 5 mm diameter.  Velocities (  all hepatopetal): Main:  30-44 cm/sec Right: 26 cm/sec Left:  22 cm/sec Hepatic Vein Velocities (all hepatofugal): Right:  53 cm/sec Middle:  73 cm/sec Left:  59 cm/sec Hepatic Artery Velocity: 56 cm/sec IVC:  48 cm/ sec intrahepatic segment Ascites: Present Right pleural effusion is also noted. IMPRESSION: 1.  Normal hepatic vascular Doppler evaluation. 2. Abdominal ascites and right pleural effusion. Electronically Signed   By: Corlis Leak M.D.   On: 06/12/2016 16:49   Dg Chest Port 1 View  Result Date: 06/12/2016 CLINICAL DATA:  CHF, COPD, former smoker. Patient reports shortness of breath. EXAM: PORTABLE CHEST 1 VIEW COMPARISON:  PA and lateral chest x-ray of June 09, 2016 FINDINGS: The lungs are less well  inflated today. The interstitial markings are increased. An area of alveolar opacity is present in the right lower lung. There is no pleural effusion or pneumothorax. The cardiac silhouette is enlarged and the pulmonary vascularity is engorged. The ICD is in stable position. There is calcification in the wall of the aortic arch. IMPRESSION: Interval development of pulmonary interstitial edema secondary to CHF. Patchy alveolar density at the right lung base likely reflects early alveolar edema. Electronically Signed   By: David  [place] M.D.   On: 06/12/2016 16:22   US Abdomen Limited Ruq  Result Date: 06/12/2016 CLINICAL DATA:  Elevated LFTs.  Previous cholecystectomy. EXAM: LIMITED RIGHT UPPER QUADRANT ABDOMINAL ULTRASOUND US HEPATIC DOPPLER ARTERIAL/VENOUS ULTRASOUND COMPARISON:  CT 12/15/2013 FINDINGS: Gallbladder:  Surgically absent Common bile duct:  Normal in caliber, 53mm diameter. Liver: Homogeneous in echotexture without focal lesion or intrahepatic bile duct dilatation. Portal Vein 5 mm diameter.  Velocities (all hepatopetal): Main:  30-44 cm/sec Right: 26 cm/sec Left:  22 cm/sec Hepatic Vein Velocities (all hepatofugal): Right:  53 cm/sec Middle:  73 cm/sec Left:  59 cm/sec Hepatic Artery Velocity: 56 cm/sec IVC:  48 cm/ sec intrahepatic segment Ascites: Present Right pleural effusion is also noted. IMPRESSION: 1.  Normal hepatic vascular Doppler evaluation. 2. Abdominal ascites and right pleural effusion. Electronically Signed   By: Corlis Leak M.D.   On: 06/12/2016 16:49      Medications:     Current Medications: . aspirin EC  81 mg Oral Daily  . cholecalciferol  5,000 Units Oral Daily  . heparin  5,000 Units Subcutaneous Q8H  . insulin aspart  0-5 Units Subcutaneous QHS  . insulin aspart  0-9 Units Subcutaneous TID WC  . isosorbide mononitrate  15 mg Oral QHS  . magnesium oxide  400 mg Oral BID  . sodium chloride flush  3 mL Intravenous Q12H     Infusions: . sodium chloride 80  mL/hr at 06/13/16 0439  . DOBUTamine 2 mcg/kg/min (06/12/16 1654)      Assessment/Plan/Discussion   1. Fall - H/O ICH from fall. Admitted after fall. CT of head negative for bleed.  2.Hyponatremia- Sodium on admit 129.  3.  A/C Biventricular Heart Failure  Volume overloaded. On dobutamine 2 mcg. Off dig with CKD.  4. AKI on CKD- Nephrology following. Creatinine rising since at admit. Not on nephrotoxic meds. On low dose dobutamine with sluggish urine output.  5. Elevated Transaminase- GI following. [place] Abd/Kidney completed.  6. Chronic A fib- not anticoagulant due to falls.  7.  CAD ICM-Off statin with elevated LFTs.  8. DMII 9. Lethargy- check ammonia 10. INR supratherapeutic--> INR 2.94 hold lovenox. Using SCDs.  11. H/O Seiziures  Not safe to take to po meds. Concern for aspiration due to lethargy. Needs  central access.   Length of Stay: 4  Amy Clegg NP-C  06/13/2016, 8:17 AM  Advanced Heart Failure Team Pager [phone] (M-F; 7a - 4p)  Please contact [facility] for night-coverage after hours (4p -7a ) and weekends on [email or web address]  Patient seen with NP, agree with the above note.    He is lethargic this morning and volume overloaded with very elevated JVP.  BP is stable currently.  Creatinine has been rising, LFTs high, INR elevated despite no anticoagulation.   1. Acute on chronic systolic CHF: Ischemic cardiomyopathy.  Echo with EF 15-20%, similar to the past.  On exam, he is markedly volume overloaded with JVP to angle of jaw sitting up.  Some of the LFT rise may be due to congestive hepatopathy. He is currently on dobutamine @ 2, begun empirically.  I suspect that he does have low output HF with marked volume overload.  Possible hypotensive event prior to admission with shock liver.  Creatinine rising.  - Would continue current dobutamine gtt for now, HR is tolerating.  - Needs central access to follow CVP/co-ox, will involve CCM.  He may end up needing access for CVVH.   - He will need fluid removal => by diuresis or by CVVH, will see what creatinine is today (has been rising).  Minimal UOP yesterday.  Will involve renal service, suspect will need trial of high dose diuretics.  2. CAD: No chest pain.  On ASA 81.  No statin with markedly elevated LFTs.  3. AKI on CKD stage III: Creatinine up to 3.4 yesterday, still pending today.  Oliguric.  Suspect cardiorenal syndrome playing a role, ?ATN from hypotensive episode (though markedly low BP has not been recorded). Given volume overload, suspect he will need high dose IV diuresis and may even progress to need for CVVH.  Will ask renal service to see today.  4. Acute hepatic injury: ?Shock liver from hypotensive event.  Suspect also component of rise from hepatic congestion.  Some ascites on [place].  LFTs markedly high, await CMET today.   5. Lethargy: ?Uremia (await today's BUN), ?hepatic encephalopathy (sending NH3).  Will need to transfer to ICU setting, not awake enough for pills at this point.  Will need to follow closely for ability to defend airway.  6. Coagulopathy: Elevated INR (no coumadin) likely from liver failure.  7. Chronic atrial fibrillation: Has not been anticoagulated due to prior falls.  Watch HR on inotrope, so far ok.  Would avoid amiodarone use with markedly elevated LFTs.  8. I am concerned that prognosis here is not good.  Will need discussions with family on code status, no family here today.   Transfer to ICU setting.   [name] 06/13/2016 9:37 AM

## 2016-06-14 ENCOUNTER — Other Ambulatory Visit: Payer: Self-pay | Admitting: Family Medicine

## 2016-06-14 ENCOUNTER — Inpatient Hospital Stay (HOSPITAL_COMMUNITY): Payer: Medicare HMO

## 2016-06-14 DIAGNOSIS — G9340 Encephalopathy, unspecified: Secondary | ICD-10-CM

## 2016-06-14 LAB — COMPREHENSIVE METABOLIC PANEL
ALBUMIN: 3.5 g/dL (ref 3.5–5.0)
ALK PHOS: 85 U/L (ref 38–126)
ALT: 1057 U/L — AB (ref 17–63)
AST: 794 U/L — AB (ref 15–41)
Anion gap: 12 (ref 5–15)
BUN: 77 mg/dL — AB (ref 6–20)
CALCIUM: 9.1 mg/dL (ref 8.9–10.3)
CHLORIDE: 90 mmol/L — AB (ref 101–111)
CO2: 25 mmol/L (ref 22–32)
CREATININE: 3.57 mg/dL — AB (ref 0.61–1.24)
GFR calc Af Amer: 17 mL/min — ABNORMAL LOW (ref >60)
GFR calc non Af Amer: 15 mL/min — ABNORMAL LOW (ref >60)
GLUCOSE: 104 mg/dL — AB (ref 65–99)
Potassium: 4.6 mmol/L (ref 3.5–5.1)
SODIUM: 127 mmol/L — AB (ref 135–145)
Total Bilirubin: 3.3 mg/dL — ABNORMAL HIGH (ref 0.3–1.2)
Total Protein: 5.7 g/dL — ABNORMAL LOW (ref 6.5–8.1)

## 2016-06-14 LAB — COOXEMETRY PANEL
CARBOXYHEMOGLOBIN: 1.3 % (ref 0.5–1.5)
Methemoglobin: 0.8 % (ref 0.0–1.5)
O2 Saturation: 66.9 %
TOTAL HEMOGLOBIN: 14 g/dL (ref 12.0–16.0)

## 2016-06-14 LAB — CBC
HEMATOCRIT: 39.7 % (ref 39.0–52.0)
Hemoglobin: 13.8 g/dL (ref 13.0–17.0)
MCH: 33.6 pg (ref 26.0–34.0)
MCHC: 34.8 g/dL (ref 30.0–36.0)
MCV: 96.6 fL (ref 78.0–100.0)
Platelets: 69 10*3/uL — ABNORMAL LOW (ref 150–400)
RBC: 4.11 MIL/uL — ABNORMAL LOW (ref 4.22–5.81)
RDW: 15.4 % (ref 11.5–15.5)
WBC: 8.2 10*3/uL (ref 4.0–10.5)

## 2016-06-14 LAB — PROTIME-INR
INR: 2.06
Prothrombin Time: 23.6 seconds — ABNORMAL HIGH (ref 11.4–15.2)

## 2016-06-14 LAB — BASIC METABOLIC PANEL
ANION GAP: 14 (ref 5–15)
BUN: 76 mg/dL — ABNORMAL HIGH (ref 6–20)
CALCIUM: 8.9 mg/dL (ref 8.9–10.3)
CO2: 27 mmol/L (ref 22–32)
Chloride: 87 mmol/L — ABNORMAL LOW (ref 101–111)
Creatinine, Ser: 3.23 mg/dL — ABNORMAL HIGH (ref 0.61–1.24)
GFR, EST AFRICAN AMERICAN: 19 mL/min — AB (ref >60)
GFR, EST NON AFRICAN AMERICAN: 17 mL/min — AB (ref >60)
Glucose, Bld: 129 mg/dL — ABNORMAL HIGH (ref 65–99)
Potassium: 3.5 mmol/L (ref 3.5–5.1)
Sodium: 128 mmol/L — ABNORMAL LOW (ref 135–145)

## 2016-06-14 LAB — GLUCOSE, CAPILLARY
GLUCOSE-CAPILLARY: 106 mg/dL — AB (ref 65–99)
GLUCOSE-CAPILLARY: 116 mg/dL — AB (ref 65–99)
GLUCOSE-CAPILLARY: 125 mg/dL — AB (ref 65–99)
GLUCOSE-CAPILLARY: 133 mg/dL — AB (ref 65–99)
GLUCOSE-CAPILLARY: 147 mg/dL — AB (ref 65–99)
GLUCOSE-CAPILLARY: 95 mg/dL (ref 65–99)
Glucose-Capillary: 121 mg/dL — ABNORMAL HIGH (ref 65–99)
Glucose-Capillary: 130 mg/dL — ABNORMAL HIGH (ref 65–99)

## 2016-06-14 LAB — PHOSPHORUS
PHOSPHORUS: 5.7 mg/dL — AB (ref 2.5–4.6)
PHOSPHORUS: 5.1 mg/dL — AB (ref 2.5–4.6)
PHOSPHORUS: 4.1 mg/dL (ref 2.5–4.6)

## 2016-06-14 LAB — MAGNESIUM
Magnesium: 3 mg/dL — ABNORMAL HIGH (ref 1.7–2.4)
Magnesium: 2.8 mg/dL — ABNORMAL HIGH (ref 1.7–2.4)
Magnesium: 2.6 mg/dL — ABNORMAL HIGH (ref 1.7–2.4)

## 2016-06-14 MED ORDER — FAMOTIDINE 40 MG/5ML PO SUSR
20.0000 mg | Freq: Every day | ORAL | Status: DC
  Administered 2016-06-14 – 2016-06-19 (×6): 20 mg
  Filled 2016-06-14 (×6): qty 2.5

## 2016-06-14 MED ORDER — PRO-STAT SUGAR FREE PO LIQD
60.0000 mL | Freq: Two times a day (BID) | ORAL | Status: DC
  Administered 2016-06-14 – 2016-06-19 (×10): 60 mL
  Filled 2016-06-14 (×10): qty 60

## 2016-06-14 MED ORDER — PRO-STAT SUGAR FREE PO LIQD
30.0000 mL | Freq: Two times a day (BID) | ORAL | Status: DC
  Administered 2016-06-14: 30 mL
  Filled 2016-06-14: qty 30

## 2016-06-14 MED ORDER — VITAL HIGH PROTEIN PO LIQD
1000.0000 mL | ORAL | Status: DC
  Administered 2016-06-14: 1000 mL

## 2016-06-14 MED ORDER — VITAL 1.5 CAL PO LIQD
1000.0000 mL | ORAL | Status: DC
  Administered 2016-06-14 – 2016-06-18 (×5): 1000 mL
  Filled 2016-06-14 (×7): qty 1000

## 2016-06-14 NOTE — Progress Notes (Signed)
Advanced Heart Failure Rounding Note   Subjective:    3/8 Intubated, then developed left PTX thought to be due to positive pressure ventilati. CT placed. Diuresed with 120 mg IV lasix three times daily. Brisk diuresis noted. Remains on dobutamine 2.0 mcg/kg/min.  CVP 16 today.   He is off sedation, no purposeful response yet.  Twitching noted.   CO-OX 66%.  AST 62>9476>546 ALT 16>1803> 1057  Creatinine 2.24> 3.4>3.72>3.57   Objective:   Weight Range:  Vital Signs:   Temp:  [96.4 F (35.8 C)-98.2 F (36.8 C)] 97.7 F (36.5 C) (03/09 0400) Pulse Rate:  [80-111] 83 (03/09 0700) Resp:  [11-23] 14 (03/09 0700) BP: (73-122)/(49-90) 103/78 (03/09 0700) SpO2:  [84 %-100 %] 100 % (03/09 0700) FiO2 (%):  [40 %-100 %] 40 % (03/09 0425) Weight:  [160 lb 15 oz (73 kg)-164 lb 10.9 oz (74.7 kg)] 160 lb 15 oz (73 kg) (03/09 0400) Last BM Date: 06/13/16  Weight change: Filed Weights   06/12/16 2251 06/13/16 1100 06/14/16 0400  Weight: 163 lb 9.3 oz (74.2 kg) 164 lb 10.9 oz (74.7 kg) 160 lb 15 oz (73 kg)    Intake/Output:   Intake/Output Summary (Last 24 hours) at 06/14/16 0745 Last data filed at 06/14/16 0515  Gross per 24 hour  Intake           609.17 ml  Output             2220 ml  Net         -1610.83 ml     Physical Exam: CVP 16 General:  Intubated, not responsive.  HEENT: normal Neck: supple. JVP to jaw . Carotids 2+ bilat; no bruits. No lymphadenopathy or thryomegaly appreciated. LIJ  Cor: PMI nondisplaced. Regular rate & rhythm. No rubs, gallops or murmurs. Lungs: Rhonchi throughtout.  Abdomen: soft, nontender, nondistended. No hepatosplenomegaly. No bruits or masses.  Extremities: no cyanosis, clubbing, rash, edema. R and LLE SCDs.  Neuro: intubated, not responsive, twitching noted in legs/arms  Telemetry: personally reviewed. A fib 90s   Labs: Basic Metabolic Panel:  Recent Labs Lab 06/11/16 1415 06/12/16 0543 06/13/16 0833 06/13/16 1010 06/13/16 1600  06/14/16 0352  NA  --  131* 123* 125* 126* 127*  K  --  5.6* 6.7* 6.3* 5.9* 4.6  CL  --  89* 89* 89* 91* 90*  CO2  --  13* 15* 16* 18* 25  GLUCOSE  --  48* 148* 152* 159* 104*  BUN  --  51* 71* 70* 75* 77*  CREATININE  --  3.41* 3.90* 3.89* 3.78* 3.57*  CALCIUM  --  10.2 9.0 9.2 9.0 9.1  MG 2.4  --   --   --   --  3.0*  PHOS  --   --   --   --   --  5.7*    Liver Function Tests:  Recent Labs Lab 06/11/16 0511 06/12/16 0543 06/12/16 1342 06/13/16 0833 06/14/16 0352  AST 70* 3,202* 4,975* 2,378* 794*  ALT 37 1,131* 1,803* 1,567* 1,057*  ALKPHOS 81 97 99 96 85  BILITOT 2.4* 4.1* 3.8* 3.5* 3.3*  PROT 6.6 7.1 6.9 6.2* 5.7*  ALBUMIN 4.1 4.4 4.3 4.0 3.5    Recent Labs Lab 06/27/2016 2111  LIPASE 13    Recent Labs Lab 06/11/16 0511 06/12/16 1342 06/13/16 1010  AMMONIA 67* 45* 38*    CBC:  Recent Labs Lab 06/25/2016 2111 06/10/16 1412 06/14/16 0352  WBC 10.4 10.8* 8.2  NEUTROABS 7.2  --   --   HGB 15.8 15.7 13.8  HCT 46.5 47.0 39.7  MCV 100.2* 99.8 96.6  PLT 152 153 69*    Cardiac Enzymes:  Recent Labs Lab 06/08/2016 2111 06/12/16 1342 06/12/16 1530 06/13/16 1010 06/13/16 1600 06/13/16 2157  CKTOTAL  --   --  38*  --   --   --   TROPONINI 0.06* 0.38*  --  0.25* 0.26* 0.22*    BNP: BNP (last 3 results)  Recent Labs  05/30/16 1336 06/16/2016 2111 06/12/16 1343  BNP 1,104.3* 1,879.0* 2,370.0*    ProBNP (last 3 results) No results for input(s): PROBNP in the last 8760 hours.    Other results:  Imaging: US Renal  Result Date: 06/12/2016 CLINICAL DATA:  82 year old male with acute tubular necrosis. Initial encounter. EXAM: RENAL / URINARY TRACT ULTRASOUND COMPLETE COMPARISON:  Noncontrast CT Abdomen and Pelvis 12/15/2013 FINDINGS: Right Kidney: Length: 10.4 cm. Thinning of the renal cortex. Cortical echogenicity at the upper limits of normal to mildly increased (image 9). No hydronephrosis or right renal mass. Left Kidney: Length: 10.6 cm. Better  preserved renal cortex, although cortical echogenicity appears to be similar to the right side at the upper limits of normal to mildly increased. Suspected 7-8 mm lower pole calculus (image 36). No left hydronephrosis or renal mass. Bladder: Appears normal for degree of bladder distention. Other findings: Moderate volume perihepatic ascites with nodular liver contour (image 69). Small volume pelvic ascites (image 63). IMPRESSION: 1. No acute renal findings. Renal cortical atrophy and possible chronic medical renal disease. Suspected nonobstructing left lower pole renal calculus. 2. Cirrhotic appearing liver with small to moderate volume ascites. Electronically Signed   By: Genevie Ann M.D.   On: 06/12/2016 15:35   [place] Art/ven Flow Abd Pelv Doppler  Result Date: 06/12/2016 CLINICAL DATA:  Elevated LFTs.  Previous cholecystectomy. EXAM: LIMITED RIGHT UPPER QUADRANT ABDOMINAL ULTRASOUND US HEPATIC DOPPLER ARTERIAL/VENOUS ULTRASOUND COMPARISON:  CT 12/15/2013 FINDINGS: Gallbladder:  Surgically absent Common bile duct:  Normal in caliber, 49m diameter. Liver: Homogeneous in echotexture without focal lesion or intrahepatic bile duct dilatation. Portal Vein 5 mm diameter.  Velocities (all hepatopetal): Main:  30-44 cm/sec Right: 26 cm/sec Left:  22 cm/sec Hepatic Vein Velocities (all hepatofugal): Right:  53 cm/sec Middle:  73 cm/sec Left:  59 cm/sec Hepatic Artery Velocity: 56 cm/sec IVC:  48 cm/ sec intrahepatic segment Ascites: Present Right pleural effusion is also noted. IMPRESSION: 1.  Normal hepatic vascular Doppler evaluation. 2. Abdominal ascites and right pleural effusion. Electronically Signed   By: DLucrezia EuropeM.D.   On: 06/12/2016 16:49   Dg Chest Port 1 View  Result Date: 06/14/2016 CLINICAL DATA:  82year old male with hyponatremia, falls. Intubated with subsequent left pneumothorax, treated with left chest tube. EXAM: PORTABLE CHEST 1 VIEW COMPARISON:  06/13/2016 and earlier. FINDINGS: Portable AP semi  upright view at 0406 hours. Left chest tube remains in place. Trace left apical pneumothorax visible today. Stable endotracheal tube tip between the level the clavicles and carina. Enteric tube courses to the abdomen and loops mildly in the proximal stomach before continuing, tip not included. Stable left chest cardiac AICD. Stable left IJ central line. Increased veiling opacity at the right lung base. Stable cardiac size and mediastinal contours. Calcified aortic atherosclerosis. No pulmonary edema. No left pleural effusion identified. Negative visible bowel gas pattern. IMPRESSION: 1.  Stable lines and tubes. 2. Trace left apical pneumothorax. 3. Increased small right pleural effusion. Electronically Signed  By: Odessa Fleming M.D.   On: 06/14/2016 06:38   Dg Chest Port 1 View  Result Date: 06/13/2016 CLINICAL DATA:  Ventilator dependent respiratory failure. Bedside central venous catheter placement. EXAM: PORTABLE CHEST 1 VIEW 7:23 p.m.: COMPARISON:  Portable chest x-ray earlier today 7:04 p.m., 5:57 p.m., 5:52 p.m. and previously. FINDINGS: New left jugular central venous catheter tip projects over the mid SVC. No evidence of left pneumothorax with the left chest tube in place. Endotracheal tube tip projects approximately 4 cm above the carina. Feeding 2 courses below the diaphragm into the stomach its tip is not included on the image. Cardiac silhouette markedly enlarged, unchanged. No evidence of mediastinal hematoma. Mild atelectasis in the lung bases. Mild pulmonary venous hypertension without overt edema. No confluent airspace consolidation. IMPRESSION: 1. Left jugular central venous catheter tip projects over the mid SVC. No acute complicating features. 2. Remaining support apparatus satisfactory. 3. Left chest tube in place with no pneumothorax. 4. Marked cardiomegaly. Pulmonary venous hypertension without overt edema. 5. Mild bibasilar atelectasis. Electronically Signed   By: Hulan Saas M.D.   On:  06/13/2016 19:41   Dg Chest Port 1 View  Result Date: 06/13/2016 CLINICAL DATA:  82 y/o  M; chest tube for follow-up. EXAM: PORTABLE CHEST 1 VIEW COMPARISON:  None. FINDINGS: Stable cardiomegaly given projection and technique. Single lead AICD. Enteric tube tip is below the field of view and abdomen. Endotracheal tube tip 3.1 cm from carina. Aortic atherosclerosis with calcification. Interval placement of a left-sided chest tube with trace residual left-sided pneumothorax. Linear opacities at lung bases may represent vascular congestion or minor atelectasis. IMPRESSION: Interval placement of left chest tube with re-expansion of lung and trace residual left-sided pneumothorax. Linear opacities at lung bases may represent minor atelectasis or vascular congestion. Electronically Signed   By: Mitzi Hansen M.D.   On: 06/13/2016 19:19   Dg Chest Port 1 View  Result Date: 06/13/2016 CLINICAL DATA:  Endotracheal tube retraction 2 cm EXAM: PORTABLE CHEST 1 VIEW COMPARISON:  1752 hours on the same day FINDINGS: Unchanged left-sided large pneumothorax. Stable cardiomegaly with aortic atherosclerosis. Endotracheal tube tip is approximately 4.7 cm above the carina in satisfactory position. Gastric tube projects over the left upper quadrant of the abdomen. Left-sided pacemaker apparatus with right ventricular defibrillator lead is noted. No significant change in the appearance of the mediastinum with slight left-to-right shift. Right lung is clear and expanded without consolidation. Osteoarthritic change noted about the [facility] and glenohumeral joints. IMPRESSION: 1. Endotracheal tube tip is approximately 4.7 cm above the carina. 2. Relatively unchanged large left-sided pneumothorax. This finding was previously called to NP [facility] by Dr. Deanne Coffer on the CXR immediately preceding this exam. 3. Stable cardiomegaly with aortic atherosclerosis. 4. Gastric tube extends into the expected location of the stomach.  Electronically Signed   By: Tollie Eth M.D.   On: 06/13/2016 18:22   Dg Chest Port 1 View  Result Date: 06/13/2016 CLINICAL DATA:  Pt intubated; ETT placement EXAM: PORTABLE CHEST - 1 VIEW COMPARISON:  the previous day's study FINDINGS: There is a large left pneumothorax which is new since the previous exam. There is may be some slight rightward mediastinal shift, and there is new depression of the left diaphragmatic leaflet. Endotracheal tube has been placed with its tip approximately 4.7 cm above carina. Feeding tube advanced at least as far as the stomach, tip not seen. Right lung remains clear. Mild cardiomegaly stable. Atheromatous aorta. Left subclavian transvenous AICD stable. No effusion,  although the left cardiophrenic angle is excluded. Visualized bones unremarkable. IMPRESSION: 1. Large left pneumothorax, new since previous film, with early signs of tension. Critical Value/emergent results were called by telephone at the time of interpretation on 06/13/2016 at 6:20 pm to [facility] NP, who verbally acknowledged these results. 2. Endotracheal tube and feeding tube in expected location. Electronically Signed   By: Lucrezia Europe M.D.   On: 06/13/2016 18:21   Dg Chest Port 1 View  Result Date: 06/12/2016 CLINICAL DATA:  CHF, COPD, former smoker. Patient reports shortness of breath. EXAM: PORTABLE CHEST 1 VIEW COMPARISON:  PA and lateral chest x-ray of June 09, 2016 FINDINGS: The lungs are less well inflated today. The interstitial markings are increased. An area of alveolar opacity is present in the right lower lung. There is no pleural effusion or pneumothorax. The cardiac silhouette is enlarged and the pulmonary vascularity is engorged. The ICD is in stable position. There is calcification in the wall of the aortic arch. IMPRESSION: Interval development of pulmonary interstitial edema secondary to CHF. Patchy alveolar density at the right lung base likely reflects early alveolar edema. Electronically Signed    By: David  Martinique M.D.   On: 06/12/2016 16:22   Dg Abd Portable 1v  Result Date: 06/13/2016 CLINICAL DATA:  NG tube placement. EXAM: PORTABLE ABDOMEN - 1 VIEW COMPARISON:  CT abdomen and pelvis 12/15/2013 FINDINGS: Large caliber enteric tube terminates in the right upper quadrant in the region of the proximal duodenum or possibly distal stomach. Gas is present in loops of grossly nondilated small and large bowel. Cholecystectomy clips and an ICD lead are noted. The visualized lung bases are grossly clear. IMPRESSION: Enteric tube terminates at the expected level of the proximal duodenum. Electronically Signed   By: Logan Bores M.D.   On: 06/13/2016 16:07   US Abdomen Limited Ruq  Result Date: 06/12/2016 CLINICAL DATA:  Elevated LFTs.  Previous cholecystectomy. EXAM: LIMITED RIGHT UPPER QUADRANT ABDOMINAL ULTRASOUND US HEPATIC DOPPLER ARTERIAL/VENOUS ULTRASOUND COMPARISON:  CT 12/15/2013 FINDINGS: Gallbladder:  Surgically absent Common bile duct:  Normal in caliber, 55m diameter. Liver: Homogeneous in echotexture without focal lesion or intrahepatic bile duct dilatation. Portal Vein 5 mm diameter.  Velocities (all hepatopetal): Main:  30-44 cm/sec Right: 26 cm/sec Left:  22 cm/sec Hepatic Vein Velocities (all hepatofugal): Right:  53 cm/sec Middle:  73 cm/sec Left:  59 cm/sec Hepatic Artery Velocity: 56 cm/sec IVC:  48 cm/ sec intrahepatic segment Ascites: Present Right pleural effusion is also noted. IMPRESSION: 1.  Normal hepatic vascular Doppler evaluation. 2. Abdominal ascites and right pleural effusion. Electronically Signed   By: DLucrezia EuropeM.D.   On: 06/12/2016 16:49      Medications:     Scheduled Medications: . sodium chloride   Intravenous Once  . chlorhexidine gluconate (MEDLINE KIT)  15 mL Mouth Rinse BID  . Chlorhexidine Gluconate Cloth  6 each Topical Daily  . furosemide  120 mg Intravenous Q8H  . insulin aspart  2-6 Units Subcutaneous Q4H  . mouth rinse  15 mL Mouth Rinse 10  times per day  . sodium chloride flush  10-40 mL Intracatheter Q12H  . sodium chloride flush  3 mL Intravenous Q12H     Infusions: . DOBUTamine 2 mcg/kg/min (06/12/16 1654)  . propofol (DIPRIVAN) infusion Stopped (06/14/16 0743)     PRN Medications:  fentaNYL (SUBLIMAZE) injection, fentaNYL (SUBLIMAZE) injection, ipratropium-albuterol, MUSCLE RUB, ondansetron **OR** ondansetron (ZOFRAN) IV, sodium chloride flush   Assessment/Plan/Discussin    1.  Fall - H/O ICH from fall. Admitted after fall. CT of head negative for bleed.  2.Hyponatremia- Sodium on admit 129>127.  3.  A/C Biventricular Heart Failure  Volume overloaded. CVP 16. On 120 mg IV lasix every 8 hours. On dobutamine 2 mcg. Off dig with CKD.  4. AKI on CKD- Nephrology following. Creatinine coming down. Remains of dobutamine 2 mcg. CO-OX 67%.  Not on nephrotoxic meds. On low dose dobutamine with sluggish urine output.  5. Elevated Transaminase- GI following. [place] Abd/Kidney completed. No acute findings. Starting to trend down.  6. AMS/Lethargy- Intubated 3/8. Start pepcid.  7. Chronic A fib- not anticoagulant due to falls. SCDs in place.  8.   CAD ICM-Off statin with elevated LFTs.  9. DMII - on sliding scale.  10. INR supratherapeutic--> INR trending down to 2.0. Using SCDs.  11. H/O Seizures  Length of Stay: 5   Amy Clegg NP-C  06/14/2016, 7:45 AM  Advanced Heart Failure Team Pager [phone] (M-F; [place])  Please contact [facility] for night-coverage after hours (4p -7a ) and weekends on [email or web address]  Patient seen with NP, agree with the above note.   1. Acute on chronic systolic CHF: Ischemic cardiomyopathy.  Echo with EF 15-20%, similar to the past.  Some of the LFT rise may be due to congestive hepatopathy. He is currently on dobutamine @ 2, begun empirically.  I suspect that he does have low output HF with marked volume overload.  Possible hypotensive event prior to admission with shock liver.  Creatinine appears  to have stabilized, good UOP on current Lasix.  Today, CVP 16 with co-ox 67%. .  - Would continue current dobutamine gtt for now, HR is tolerating.  - Continue Lasix 120 mg IV every 8 hrs.  2. CAD: No chest pain.  On ASA 81.  No statin with markedly elevated LFTs.  3. AKI on CKD stage III: Creatinine up to 3.7 yesterday, coming down today.  Suspect cardiorenal syndrome playing a role, ?ATN from hypotensive episode around time of initial admission (though markedly low BP has not been recorded).  - Doing well with IV diuresis, continue.  4. Acute hepatic injury: ?Shock liver from hypotensive event.  Suspect also component of rise from hepatic congestion.  Some ascites on [place].  LFTs trending down now.  Maintain BP and cardiac output.    5. Neuro: ?Seizures this morning with twitching.  EEG ordered.  6. Coagulopathy: Elevated INR (no coumadin) likely from liver failure.  7. Chronic atrial fibrillation: Has not been anticoagulated due to prior falls.  Watch HR on inotrope, so far ok.  Would avoid amiodarone use with markedly elevated LFTs.  8. Thrombocytopenia: Noted this morning.  Likely due to underlying process. Has not been getting heparin.  9. Hyperkalemia: Resolved.  10. PTX: On left, due to positive pressure ventilation, has chest tube.   Loralie Champagne 06/14/2016 9:50 AM

## 2016-06-14 NOTE — Procedures (Signed)
ELECTROENCEPHALOGRAM REPORT  Date of Study: 06/14/2016  Patient's Name: [name] MRN: [id number] Date of Birth: 21-Oct-1934  Referring Provider: Dr. Lawanda Cousins  Clinical History: This is a 82 year old man with altered mental status.  Medications: DOBUTamine (DOBUTREX) infusion 4000 mcg/mL  famotidine (PEPCID) 40 MG/5ML suspension 20 mg  furosemide (LASIX) 120 mg in dextrose 5 % 50 mL IVPB  insulin aspart (novoLOG) injection 2-6 Units  ipratropium-albuterol (DUONEB) 0.5-2.5 (3) MG/3ML nebulizer solution 3 mL   Technical Summary: A multichannel digital EEG recording measured by the international 10-20 system with electrodes applied with paste and impedances below 5000 ohms performed as portable with EKG monitoring in an intubated and unresponsive patient.  Hyperventilation and photic stimulation were not performed.  The digital EEG was referentially recorded, reformatted, and digitally filtered in a variety of bipolar and referential montages for optimal display.   Description: The patient is intubated and unresponsive during the recording. Sedation turned off 2 hours prior to study. There is no clear posterior dominant rhythm. The background consists of a large amount of diffuse 4-5 Hz theta and 2-3 Hz delta slowing, at times with triphasic-like appearance. There is additional occasional focal slowing over the left hemisphere, maximal over the left frontal region, at times also with triphasic-like appearance. Normal sleep architecture is not seen. Hyperventilation and photic stimulation were not performed. Body tremors did not show EEG correlate. There were no epileptiform discharges or electrographic seizures seen.    EKG lead showed tachycardia with irregular rhythm.  Impression: This EEG is abnormal due to the presence of: 1. Moderate diffuse slowing of the background, at times with triphasic-like appearance 2. Additional occasional focal slowing over the left hemisphere,  maximal over the left frontal region  Clinical Correlation of the above findings indicates diffuse cerebral dysfunction that is non-specific in etiology and can be seen with hypoxic/ischemic injury, toxic/metabolic encephalopathies, neurodegenerative disorders, or medication effect. Additional focal slowing over the left hemisphere indicates focal cerebral dysfunction in this region suggestive of underlying structural or physiologic abnormality. Body tremors did not show EEG correlate. Clinical correlation is advised.   Patrcia Dolly, M.D.

## 2016-06-14 NOTE — Progress Notes (Signed)
PULMONARY / CRITICAL CARE MEDICINE   Name: JACQUISE HALPIN MRN: [id number] DOB: 01-18-1935    ADMISSION DATE:  06/10/2016 CONSULTATION DATE:  06/13/2016  REFERRING MD:  Dr. Caleb Popp   CHIEF COMPLAINT:  Acute on Chronic HF  HISTORY OF PRESENT ILLNESS:   81 year old male with extensive medical history as below, presents to ED on 3/5 at [facility] post-fall. Family reports that patient has recently started Lamictal and as felt nauseous and dizzy. In December patient was on Depakote ER for a history of seizures - could not tolerate so switched to Lamictal. During stay patient developed acute on chronic kidney injury, acute on chronic systolic heart failure, and hepatic kidney injury. Decision was made to transfer patient to [facility] for possible right heart cath.   PAST MEDICAL HISTORY :  He  has a past medical history of Arteriosclerotic cardiovascular disease (ASCVD); At risk for sudden cardiac death (09/02/2012); Atrial fibrillation (HCC); Atrial flutter (HCC); Bleeding in brain due to brain aneurysm [facility]) (October 06 2014); CAD (coronary artery disease); CHF (congestive heart failure) (HCC); Chronic anticoagulation (2012); COPD (chronic obstructive pulmonary disease) (HCC); DJD (degenerative joint disease); Gout; Hyperlipidemia; ICD (implantable cardiac defibrillator) in place; Ischemic cardiomyopathy; Kidney stone; Myocardial infarction (1998); NICM (nonischemic cardiomyopathy), EF 25-30% (2012/09/02); Obstructive sleep apnea; Permanent atrial fibrillation (HCC) (02/15/2011); S/P ICD (internal cardiac defibrillator) procedure, 08/17/12, [employer] (09-02-12); and Type II diabetes mellitus (HCC).  PAST SURGICAL HISTORY: He  has a past surgical history that includes Cholecystectomy (2009); Knee Arthroplasty (Left, 1978); Vasectomy (~ 1964); Cystoscopy/retrograde/ureteroscopy (06/28/2011); Stone extraction with basket (06/28/2011); Cardiac defibrillator placement (08/17/2012); Coronary angioplasty  with stent (07/14/1996); Cataract extraction w/ intraocular lens  implant, bilateral (Bilateral, ~ 2011); US ECHOCARDIOGRAPHY (07/08/2012); Myoview perfusion scan (06/02/2012); s/p icd (08/2012); implantable cardioverter defibrillator implant (N/A, 08/17/2012); and Cardiac catheterization (02/2003).  Allergies  Allergen Reactions  . Novocain [Procaine] Nausea And Vomiting and Other (See Comments)    Passes out (also)  . Lipitor [Atorvastatin] Other (See Comments)    Myalgias    . Procaine Hcl Nausea And Vomiting  . Tramadol Itching and Nausea And Vomiting    No current facility-administered medications on file prior to encounter.    Current Outpatient Prescriptions on File Prior to Encounter  Medication Sig  . aspirin EC 81 MG tablet Take 81 mg by mouth daily.  . Cholecalciferol (VITAMIN D3) 5000 units CAPS Take 1 capsule by mouth daily.  . digoxin (LANOXIN) 0.25 MG tablet Take 1/2 tablet daily (Patient taking differently: Take 0.125 mg by mouth daily. Take 1/2 tablet daily)  . furosemide (LASIX) 40 MG tablet Take 1.5 tablets in the morning and 1/2 tablet @ 4 (Patient taking differently: Take 20-60 mg by mouth 2 (two) times daily. Take 1.5 tablets in the morning and 1/2 tablet @ 4)  . isosorbide mononitrate (IMDUR) 30 MG 24 hr tablet Take 0.5 tablets (15 mg total) by mouth at bedtime.  . LamoTRIgine XR 200 MG TB24 Take one tablet at bedtime. (Patient taking differently: Take 200 mg by mouth at bedtime. Take one tablet at bedtime.)  . magnesium oxide (MAG-OX) 400 MG tablet Take 1 tablet (400 mg total) by mouth 2 (two) times daily.  . metFORMIN (GLUCOPHAGE) 500 MG tablet Take 500 mg by mouth 2 (two) times daily with a meal.   . potassium chloride (K-DUR) 10 MEQ tablet Take 1 tablet (10 mEq total) by mouth daily.  . pravastatin (PRAVACHOL) 40 MG tablet Take 1 tablet (40 mg total)  by mouth daily.  Marland Kitchen lamoTRIgine (LAMICTAL) 25 MG tablet 1 tablet twice a day for 7 days, then 2 tablets twice a day for  7 days, then 3 tablets twice a day for 7 days, then Start new prescription for lamotrigine XR 200mg , one tablet at bedtime. (Patient not taking: Reported on 06/06/2016)    FAMILY HISTORY:  His indicated that his mother is deceased. He indicated that his father is deceased.    SOCIAL HISTORY: He  reports that he quit smoking about 24 years ago. His smoking use included Cigarettes. He has a 80.00 pack-year smoking history. He has quit using smokeless tobacco. He reports that he does not drink alcohol or use drugs.  REVIEW OF SYSTEMS:   Unable to review as patient is obtunded.   SUBJECTIVE:  Eventful day over 24 hours Transferred to ICU, intubated, left chest tube placement for pneumothorax, central line placed. Continues on dopamine gtt.   VITAL SIGNS: BP (!) 115/105   Pulse 96   Temp 97.8 F (36.6 C) (Oral)   Resp 16   Ht 5\' 4"  (1.626 m)   Wt 160 lb 15 oz (73 kg)   SpO2 100%   BMI 27.62 kg/m   HEMODYNAMICS: CVP:  [14 mmHg-16 mmHg] 14 mmHg  VENTILATOR SETTINGS: Vent Mode: PSV;CPAP FiO2 (%):  [40 %-100 %] 40 % Set Rate:  [14 bmp] 14 bmp Vt Set:  [500 mL] 500 mL PEEP:  [5 cmH20] 5 cmH20 Pressure Support:  [10 cmH20] 10 cmH20 Plateau Pressure:  [12 cmH20-26 cmH20] 15 cmH20  INTAKE / OUTPUT: I/O last 3 completed shifts: In: 1577.4 [P.O.:60; I.V.:1012.4; Blood:319; IV Piggyback:186] Out: 2470 [Urine:2420; Chest Tube:50]  PHYSICAL EXAMINATION: Blood pressure (!) 115/105, pulse 96, temperature 97.8 F (36.6 C), temperature source Oral, resp. rate 16, height 5\' 4"  (1.626 m), weight 160 lb 15 oz (73 kg), SpO2 100 %. Gen:      No acute distress, non purposeful movements HEENT:  EOMI, sclera anicteric Neck:     No masses; no thyromegaly Lungs:    Clear to auscultation bilaterally; normal respiratory effort CV:         Regular rate and rhythm; no murmurs Abd:      + bowel sounds; soft, non-tender; no palpable masses, no distension Ext:    No edema; adequate peripheral  perfusion Skin:      Warm and dry; no rash Neuro: Non responsive  LABS:  BMET  Recent Labs Lab 06/13/16 1010 06/13/16 1600 06/14/16 0352  NA 125* 126* 127*  K 6.3* 5.9* 4.6  CL 89* 91* 90*  CO2 16* 18* 25  BUN 70* 75* 77*  CREATININE 3.89* 3.78* 3.57*  GLUCOSE 152* 159* 104*    Electrolytes  Recent Labs Lab 06/11/16 1415  06/13/16 1010 06/13/16 1600 06/14/16 0352 06/14/16 0951  CALCIUM  --   < > 9.2 9.0 9.1  --   MG 2.4  --   --   --  3.0* 2.8*  PHOS  --   --   --   --  5.7* 5.1*  < > = values in this interval not displayed.  CBC  Recent Labs Lab 06/30/2016 2111 06/10/16 1412 06/14/16 0352  WBC 10.4 10.8* 8.2  HGB 15.8 15.7 13.8  HCT 46.5 47.0 39.7  PLT 152 153 69*    Coag's  Recent Labs Lab 06/13/16 0833 06/14/16 0352  INR 2.94 2.06    Sepsis Markers No results for input(s): LATICACIDVEN, PROCALCITON, O2SATVEN in the last 168  hours.  ABG  Recent Labs Lab 06/12/16 1730 06/13/16 1445  PHART 7.347* 7.478*  PCO2ART 25.8* 26.2*  PO2ART 90.0 102.0    Liver Enzymes  Recent Labs Lab 06/12/16 1342 06/13/16 0833 06/14/16 0352  AST 4,975* 2,378* 794*  ALT 1,803* 1,567* 1,057*  ALKPHOS 99 96 85  BILITOT 3.8* 3.5* 3.3*  ALBUMIN 4.3 4.0 3.5    Cardiac Enzymes  Recent Labs Lab 06/13/16 1010 06/13/16 1600 06/13/16 2157  TROPONINI 0.25* 0.26* 0.22*    Glucose  Recent Labs Lab 06/13/16 0824 06/13/16 1113 06/13/16 1640 06/13/16 2144 06/14/16 0009 06/14/16 0349  GLUCAP 156* 142* 171* 165* 147* 106*    Imaging Dg Chest Port 1 View  Result Date: 06/14/2016 CLINICAL DATA:  82 year old male with hyponatremia, falls. Intubated with subsequent left pneumothorax, treated with left chest tube. EXAM: PORTABLE CHEST 1 VIEW COMPARISON:  06/13/2016 and earlier. FINDINGS: Portable AP semi upright view at 0406 hours. Left chest tube remains in place. Trace left apical pneumothorax visible today. Stable endotracheal tube tip between the level  the clavicles and carina. Enteric tube courses to the abdomen and loops mildly in the proximal stomach before continuing, tip not included. Stable left chest cardiac AICD. Stable left IJ central line. Increased veiling opacity at the right lung base. Stable cardiac size and mediastinal contours. Calcified aortic atherosclerosis. No pulmonary edema. No left pleural effusion identified. Negative visible bowel gas pattern. IMPRESSION: 1.  Stable lines and tubes. 2. Trace left apical pneumothorax. 3. Increased small right pleural effusion. Electronically Signed   By: Odessa Fleming M.D.   On: 06/14/2016 06:38   Dg Chest Port 1 View  Result Date: 06/13/2016 CLINICAL DATA:  Ventilator dependent respiratory failure. Bedside central venous catheter placement. EXAM: PORTABLE CHEST 1 VIEW 7:23 p.m.: COMPARISON:  Portable chest x-ray earlier today 7:04 p.m., 5:57 p.m., 5:52 p.m. and previously. FINDINGS: New left jugular central venous catheter tip projects over the mid SVC. No evidence of left pneumothorax with the left chest tube in place. Endotracheal tube tip projects approximately 4 cm above the carina. Feeding 2 courses below the diaphragm into the stomach its tip is not included on the image. Cardiac silhouette markedly enlarged, unchanged. No evidence of mediastinal hematoma. Mild atelectasis in the lung bases. Mild pulmonary venous hypertension without overt edema. No confluent airspace consolidation. IMPRESSION: 1. Left jugular central venous catheter tip projects over the mid SVC. No acute complicating features. 2. Remaining support apparatus satisfactory. 3. Left chest tube in place with no pneumothorax. 4. Marked cardiomegaly. Pulmonary venous hypertension without overt edema. 5. Mild bibasilar atelectasis. Electronically Signed   By: Hulan Saas M.D.   On: 06/13/2016 19:41   Dg Chest Port 1 View  Result Date: 06/13/2016 CLINICAL DATA:  82 y/o  M; chest tube for follow-up. EXAM: PORTABLE CHEST 1 VIEW  COMPARISON:  None. FINDINGS: Stable cardiomegaly given projection and technique. Single lead AICD. Enteric tube tip is below the field of view and abdomen. Endotracheal tube tip 3.1 cm from carina. Aortic atherosclerosis with calcification. Interval placement of a left-sided chest tube with trace residual left-sided pneumothorax. Linear opacities at lung bases may represent vascular congestion or minor atelectasis. IMPRESSION: Interval placement of left chest tube with re-expansion of lung and trace residual left-sided pneumothorax. Linear opacities at lung bases may represent minor atelectasis or vascular congestion. Electronically Signed   By: Mitzi Hansen M.D.   On: 06/13/2016 19:19   Dg Chest Port 1 View  Result Date: 06/13/2016 CLINICAL DATA:  Endotracheal tube retraction 2 cm EXAM: PORTABLE CHEST 1 VIEW COMPARISON:  1752 hours on the same day FINDINGS: Unchanged left-sided large pneumothorax. Stable cardiomegaly with aortic atherosclerosis. Endotracheal tube tip is approximately 4.7 cm above the carina in satisfactory position. Gastric tube projects over the left upper quadrant of the abdomen. Left-sided pacemaker apparatus with right ventricular defibrillator lead is noted. No significant change in the appearance of the mediastinum with slight left-to-right shift. Right lung is clear and expanded without consolidation. Osteoarthritic change noted about the [facility] and glenohumeral joints. IMPRESSION: 1. Endotracheal tube tip is approximately 4.7 cm above the carina. 2. Relatively unchanged large left-sided pneumothorax. This finding was previously called to NP [facility] by Dr. Deanne Coffer on the CXR immediately preceding this exam. 3. Stable cardiomegaly with aortic atherosclerosis. 4. Gastric tube extends into the expected location of the stomach. Electronically Signed   By: Tollie Eth M.D.   On: 06/13/2016 18:22   Dg Chest Port 1 View  Result Date: 06/13/2016 CLINICAL DATA:  Pt intubated; ETT  placement EXAM: PORTABLE CHEST - 1 VIEW COMPARISON:  the previous day's study FINDINGS: There is a large left pneumothorax which is new since the previous exam. There is may be some slight rightward mediastinal shift, and there is new depression of the left diaphragmatic leaflet. Endotracheal tube has been placed with its tip approximately 4.7 cm above carina. Feeding tube advanced at least as far as the stomach, tip not seen. Right lung remains clear. Mild cardiomegaly stable. Atheromatous aorta. Left subclavian transvenous AICD stable. No effusion, although the left cardiophrenic angle is excluded. Visualized bones unremarkable. IMPRESSION: 1. Large left pneumothorax, new since previous film, with early signs of tension. Critical Value/emergent results were called by telephone at the time of interpretation on 06/13/2016 at 6:20 pm to [facility] NP, who verbally acknowledged these results. 2. Endotracheal tube and feeding tube in expected location. Electronically Signed   By: Corlis Leak M.D.   On: 06/13/2016 18:21   Dg Abd Portable 1v  Result Date: 06/13/2016 CLINICAL DATA:  NG tube placement. EXAM: PORTABLE ABDOMEN - 1 VIEW COMPARISON:  CT abdomen and pelvis 12/15/2013 FINDINGS: Large caliber enteric tube terminates in the right upper quadrant in the region of the proximal duodenum or possibly distal stomach. Gas is present in loops of grossly nondilated small and large bowel. Cholecystectomy clips and an ICD lead are noted. The visualized lung bases are grossly clear. IMPRESSION: Enteric tube terminates at the expected level of the proximal duodenum. Electronically Signed   By: Sebastian Ache M.D.   On: 06/13/2016 16:07    STUDIES:  CT C Spine 3/4 > Neg acute injury  CT Head 3/4 > No acute of intracranial or cervical spine injury  ECHO 3/5 > 15-20% EF, RV moderately dilated with mild to moderately decreases systolic function  ABD [place] 3/7 > Normal hepatic vascular doppler evaluation, abdominal ascites and right  pleural effusion  Renal US 3/7 > No acute findings, renal cortical atrophy and possible chronic medical rental disease, suspected nonobstructive left lower pole renal calculus, cirrhotic appearing liver with small to moderate volume ascites  CULTURES: MRSA PCR 3/7 > Negative   ANTIBIOTICS: None.   SIGNIFICANT EVENTS: 3/4 > ED for fall/SI  3/7 Transfer to [facility] for cardiogenic shock, multiorgan failure 3/8 Transfer to ICU, intubated, Lt CT placed for pneumothorax, Lt IJ placed  LINES/TUBES: OETT 3/8 >> Lt chest tube 3/8 >> Lt IJ CVC 3/8 >>  DISCUSSION: 82 year old male with extensive cardiac  and renal history. Admitted 3/4 post-fall with suicidal ideation. Admission complicated by acute on chronic systolic HF, acute hepatic injury, and acute on chronic kidney disease.   ASSESSMENT / PLAN:  PULMONARY A: H/O OSA on CPAP Intubated for resp failure Lt tension pneumothorax s/p chest tube placement P:   SBT trials as tolerated.  No plan for extubation today as he remains critically ill Continue chest tube to suction Follow CXR  CARDIOVASCULAR A:  Acute on Chronic Systolic HF (EF 15-20 %) Elevated Troponin  Ischemic Cardiomyopathy S/P PTCA, Stent, ICD H/O HTN, HLD, Afib (not on anticoagulation due to history of falls)  -BNP 2370 P:  Cardiology following Continue dobutamine Follow Venous sats, CVP Hold home cardiac meds  RENAL A:   Acute on Chronic Kidney Disease Hyperkalemia  Hyponatremia  P:   Continue lasix diuresis Renal on board Follow electrolyes   GASTROINTESTINAL A:   Acute Hepatic Injury from cardiogenic shock H/O underlying cirrhosis P:   Follow LFTs > Improving Start tube feeds  HEMATOLOGIC A:   Coagulopathy, elevated INR- likely from liver failure.  s/p FFP 3/8 P:  Follow CBC, coags  INFECTIOUS A:   No issues  P:   Trend Fever and WBC curve   ENDOCRINE A:   Hyperglycemia  H/O DM P:   SSI coverage Hold metformin  NEUROLOGIC A:    Uremic, hepatic Encephalopathy  Mixed anxiety and depression  H/O Seizures, SI  P:   Monitor  Follow EEG given concern for seizure like activity  FAMILY  - Updates: Wife and family updated 3/8. They want everything done - Inter-disciplinary family meet or Palliative Care meeting due by:  3/15  The patient is critically ill with multiple organ system failure and requires high complexity decision making for assessment and support, frequent evaluation and titration of therapies, advanced monitoring, review of radiographic studies and interpretation of complex data.   Critical Care Time devoted to patient care services, exclusive of separately billable procedures, described in this note is 40 minutes.   Chilton Greathouse MD [facility] Pager [phone] If no answer or after 3pm call: [phone] 06/14/2016, 11:57 AM

## 2016-06-14 NOTE — Progress Notes (Signed)
Initial Nutrition Assessment   INTERVENTION:   Vital AF 1.5 @ 30 ml/hr (720 ml/day) 60 ml Prostat BID Provides: 1480 kcal, 108grams protein, and 550 ml free water TF regimen and propofol at current rate providing 1538 total kcal/day (97 % of kcal needs)  NUTRITION DIAGNOSIS:   Inadequate oral intake related to inability to eat as evidenced by NPO status.  GOAL:   Patient will meet greater than or equal to 90% of their needs  MONITOR:   Vent status, I & O's, TF tolerance  REASON FOR ASSESSMENT:   Consult Enteral/tube feeding initiation and management  ASSESSMENT:   Pt with ischemic CM with EF 15-20%, extensive cardiac history, ICD placement in 2014, OSA, obesity, DM II , cirrhosis, and permanent Afib not on anticoag d/t falls. Pt admitted to [facility] post fall with suicidal ideation then tx to [facility] for possible cath. Pt with acute oliguric kidney injury due to acute systolic heart failure exacerbation with cardiogenic shock.    3/8 Cortrak placed, pt intubated then had an emergent placement of L chest tube due to large L PTX likely from positive pressure ventilation.  Nephrology consulted, poor candidate for HD, no high dose lasix  MV: 12.1 L/min Temp (24hrs), Avg:97.7 F (36.5 C), Min:96.4 F (35.8 C), Max:98.2 F (36.8 C)  Propofol: 2.2 ml/hr provides 58 kcal  Na 127, PO4 5.1, magnesium 2.8, AST 794, ALT 1057 TF protocol ordered  Diet Order:  Diet NPO time specified  Skin:  Reviewed, no issues  Last BM:  3/8  Height:   Ht Readings from Last 1 Encounters:  06/13/16 5\' 4"  (1.626 m)    Weight:   Wt Readings from Last 1 Encounters:  06/14/16 160 lb 15 oz (73 kg)  Admission weight 154 lb (69.9 kg)  Ideal Body Weight:  59 kg  BMI:  Body mass index is 27.62 kg/m.  Estimated Nutritional Needs:   Kcal:  1586  Protein:  90-105 grams  Fluid:  >1.5 L/day  EDUCATION NEEDS:   No education needs identified at this time  Kendell Bane RD, LDN,  CNSC [phone] Pager [phone] After Hours Pager

## 2016-06-14 NOTE — Progress Notes (Signed)
S: intubated O:BP (!) 115/105   Pulse 96   Temp 97.8 F (36.6 C) (Oral)   Resp 16   Ht 5' 4" (1.626 m)   Wt 73 kg (160 lb 15 oz)   SpO2 100%   BMI 27.62 kg/m   Intake/Output Summary (Last 24 hours) at 06/14/16 1122 Last data filed at 06/14/16 1100  Gross per 24 hour  Intake            635.8 ml  Output             3795 ml  Net          -3159.2 ml   Weight change: 1.3 kg (2 lb 13.9 oz) Gen: Intubated and sedated CVS: Irreg, irreg Resp: Scattered rhonchi Abd:+ BS ND No HSM Ext: + edema up to thighs NEURO: Sedated   . sodium chloride   Intravenous Once  . chlorhexidine gluconate (MEDLINE KIT)  15 mL Mouth Rinse BID  . Chlorhexidine Gluconate Cloth  6 each Topical Daily  . famotidine  20 mg Per Tube Daily  . feeding supplement (PRO-STAT SUGAR FREE 64)  30 mL Per Tube BID  . feeding supplement (VITAL HIGH PROTEIN)  1,000 mL Per Tube Q24H  . furosemide  120 mg Intravenous Q8H  . insulin aspart  2-6 Units Subcutaneous Q4H  . mouth rinse  15 mL Mouth Rinse 10 times per day  . sodium chloride flush  10-40 mL Intracatheter Q12H  . sodium chloride flush  3 mL Intravenous Q12H   US Renal  Result Date: 06/12/2016 CLINICAL DATA:  82 year old male with acute tubular necrosis. Initial encounter. EXAM: RENAL / URINARY TRACT ULTRASOUND COMPLETE COMPARISON:  Noncontrast CT Abdomen and Pelvis 12/15/2013 FINDINGS: Right Kidney: Length: 10.4 cm. Thinning of the renal cortex. Cortical echogenicity at the upper limits of normal to mildly increased (image 9). No hydronephrosis or right renal mass. Left Kidney: Length: 10.6 cm. Better preserved renal cortex, although cortical echogenicity appears to be similar to the right side at the upper limits of normal to mildly increased. Suspected 7-8 mm lower pole calculus (image 36). No left hydronephrosis or renal mass. Bladder: Appears normal for degree of bladder distention. Other findings: Moderate volume perihepatic ascites with nodular liver contour  (image 69). Small volume pelvic ascites (image 63). IMPRESSION: 1. No acute renal findings. Renal cortical atrophy and possible chronic medical renal disease. Suspected nonobstructing left lower pole renal calculus. 2. Cirrhotic appearing liver with small to moderate volume ascites. Electronically Signed   By: Genevie Ann M.D.   On: 06/12/2016 15:35   [place] Art/ven Flow Abd Pelv Doppler  Result Date: 06/12/2016 CLINICAL DATA:  Elevated LFTs.  Previous cholecystectomy. EXAM: LIMITED RIGHT UPPER QUADRANT ABDOMINAL ULTRASOUND US HEPATIC DOPPLER ARTERIAL/VENOUS ULTRASOUND COMPARISON:  CT 12/15/2013 FINDINGS: Gallbladder:  Surgically absent Common bile duct:  Normal in caliber, 28m diameter. Liver: Homogeneous in echotexture without focal lesion or intrahepatic bile duct dilatation. Portal Vein 5 mm diameter.  Velocities (all hepatopetal): Main:  30-44 cm/sec Right: 26 cm/sec Left:  22 cm/sec Hepatic Vein Velocities (all hepatofugal): Right:  53 cm/sec Middle:  73 cm/sec Left:  59 cm/sec Hepatic Artery Velocity: 56 cm/sec IVC:  48 cm/ sec intrahepatic segment Ascites: Present Right pleural effusion is also noted. IMPRESSION: 1.  Normal hepatic vascular Doppler evaluation. 2. Abdominal ascites and right pleural effusion. Electronically Signed   By: DLucrezia EuropeM.D.   On: 06/12/2016 16:49   Dg Chest Port 1 View  Result Date: 06/14/2016  CLINICAL DATA:  82 year old male with hyponatremia, falls. Intubated with subsequent left pneumothorax, treated with left chest tube. EXAM: PORTABLE CHEST 1 VIEW COMPARISON:  06/13/2016 and earlier. FINDINGS: Portable AP semi upright view at 0406 hours. Left chest tube remains in place. Trace left apical pneumothorax visible today. Stable endotracheal tube tip between the level the clavicles and carina. Enteric tube courses to the abdomen and loops mildly in the proximal stomach before continuing, tip not included. Stable left chest cardiac AICD. Stable left IJ central line. Increased veiling  opacity at the right lung base. Stable cardiac size and mediastinal contours. Calcified aortic atherosclerosis. No pulmonary edema. No left pleural effusion identified. Negative visible bowel gas pattern. IMPRESSION: 1.  Stable lines and tubes. 2. Trace left apical pneumothorax. 3. Increased small right pleural effusion. Electronically Signed   By: Genevie Ann M.D.   On: 06/14/2016 06:38   Dg Chest Port 1 View  Result Date: 06/13/2016 CLINICAL DATA:  Ventilator dependent respiratory failure. Bedside central venous catheter placement. EXAM: PORTABLE CHEST 1 VIEW 7:23 p.m.: COMPARISON:  Portable chest x-ray earlier today 7:04 p.m., 5:57 p.m., 5:52 p.m. and previously. FINDINGS: New left jugular central venous catheter tip projects over the mid SVC. No evidence of left pneumothorax with the left chest tube in place. Endotracheal tube tip projects approximately 4 cm above the carina. Feeding 2 courses below the diaphragm into the stomach its tip is not included on the image. Cardiac silhouette markedly enlarged, unchanged. No evidence of mediastinal hematoma. Mild atelectasis in the lung bases. Mild pulmonary venous hypertension without overt edema. No confluent airspace consolidation. IMPRESSION: 1. Left jugular central venous catheter tip projects over the mid SVC. No acute complicating features. 2. Remaining support apparatus satisfactory. 3. Left chest tube in place with no pneumothorax. 4. Marked cardiomegaly. Pulmonary venous hypertension without overt edema. 5. Mild bibasilar atelectasis. Electronically Signed   By: Evangeline Dakin M.D.   On: 06/13/2016 19:41   Dg Chest Port 1 View  Result Date: 06/13/2016 CLINICAL DATA:  82 y/o  M; chest tube for follow-up. EXAM: PORTABLE CHEST 1 VIEW COMPARISON:  None. FINDINGS: Stable cardiomegaly given projection and technique. Single lead AICD. Enteric tube tip is below the field of view and abdomen. Endotracheal tube tip 3.1 cm from carina. Aortic atherosclerosis with  calcification. Interval placement of a left-sided chest tube with trace residual left-sided pneumothorax. Linear opacities at lung bases may represent vascular congestion or minor atelectasis. IMPRESSION: Interval placement of left chest tube with re-expansion of lung and trace residual left-sided pneumothorax. Linear opacities at lung bases may represent minor atelectasis or vascular congestion. Electronically Signed   By: Kristine Garbe M.D.   On: 06/13/2016 19:19   Dg Chest Port 1 View  Result Date: 06/13/2016 CLINICAL DATA:  Endotracheal tube retraction 2 cm EXAM: PORTABLE CHEST 1 VIEW COMPARISON:  1752 hours on the same day FINDINGS: Unchanged left-sided large pneumothorax. Stable cardiomegaly with aortic atherosclerosis. Endotracheal tube tip is approximately 4.7 cm above the carina in satisfactory position. Gastric tube projects over the left upper quadrant of the abdomen. Left-sided pacemaker apparatus with right ventricular defibrillator lead is noted. No significant change in the appearance of the mediastinum with slight left-to-right shift. Right lung is clear and expanded without consolidation. Osteoarthritic change noted about the [facility] and glenohumeral joints. IMPRESSION: 1. Endotracheal tube tip is approximately 4.7 cm above the carina. 2. Relatively unchanged large left-sided pneumothorax. This finding was previously called to NP [facility] by Dr. Vernard Gambles on the CXR  immediately preceding this exam. 3. Stable cardiomegaly with aortic atherosclerosis. 4. Gastric tube extends into the expected location of the stomach. Electronically Signed   By: Ashley Royalty M.D.   On: 06/13/2016 18:22   Dg Chest Port 1 View  Result Date: 06/13/2016 CLINICAL DATA:  Pt intubated; ETT placement EXAM: PORTABLE CHEST - 1 VIEW COMPARISON:  the previous day's study FINDINGS: There is a large left pneumothorax which is new since the previous exam. There is may be some slight rightward mediastinal shift, and there is  new depression of the left diaphragmatic leaflet. Endotracheal tube has been placed with its tip approximately 4.7 cm above carina. Feeding tube advanced at least as far as the stomach, tip not seen. Right lung remains clear. Mild cardiomegaly stable. Atheromatous aorta. Left subclavian transvenous AICD stable. No effusion, although the left cardiophrenic angle is excluded. Visualized bones unremarkable. IMPRESSION: 1. Large left pneumothorax, new since previous film, with early signs of tension. Critical Value/emergent results were called by telephone at the time of interpretation on 06/13/2016 at 6:20 pm to [facility] NP, who verbally acknowledged these results. 2. Endotracheal tube and feeding tube in expected location. Electronically Signed   By: Lucrezia Europe M.D.   On: 06/13/2016 18:21   Dg Chest Port 1 View  Result Date: 06/12/2016 CLINICAL DATA:  CHF, COPD, former smoker. Patient reports shortness of breath. EXAM: PORTABLE CHEST 1 VIEW COMPARISON:  PA and lateral chest x-ray of June 09, 2016 FINDINGS: The lungs are less well inflated today. The interstitial markings are increased. An area of alveolar opacity is present in the right lower lung. There is no pleural effusion or pneumothorax. The cardiac silhouette is enlarged and the pulmonary vascularity is engorged. The ICD is in stable position. There is calcification in the wall of the aortic arch. IMPRESSION: Interval development of pulmonary interstitial edema secondary to CHF. Patchy alveolar density at the right lung base likely reflects early alveolar edema. Electronically Signed   By: David  Martinique M.D.   On: 06/12/2016 16:22   Dg Abd Portable 1v  Result Date: 06/13/2016 CLINICAL DATA:  NG tube placement. EXAM: PORTABLE ABDOMEN - 1 VIEW COMPARISON:  CT abdomen and pelvis 12/15/2013 FINDINGS: Large caliber enteric tube terminates in the right upper quadrant in the region of the proximal duodenum or possibly distal stomach. Gas is present in loops of  grossly nondilated small and large bowel. Cholecystectomy clips and an ICD lead are noted. The visualized lung bases are grossly clear. IMPRESSION: Enteric tube terminates at the expected level of the proximal duodenum. Electronically Signed   By: Logan Bores M.D.   On: 06/13/2016 16:07   US Abdomen Limited Ruq  Result Date: 06/12/2016 CLINICAL DATA:  Elevated LFTs.  Previous cholecystectomy. EXAM: LIMITED RIGHT UPPER QUADRANT ABDOMINAL ULTRASOUND US HEPATIC DOPPLER ARTERIAL/VENOUS ULTRASOUND COMPARISON:  CT 12/15/2013 FINDINGS: Gallbladder:  Surgically absent Common bile duct:  Normal in caliber, 70m diameter. Liver: Homogeneous in echotexture without focal lesion or intrahepatic bile duct dilatation. Portal Vein 5 mm diameter.  Velocities (all hepatopetal): Main:  30-44 cm/sec Right: 26 cm/sec Left:  22 cm/sec Hepatic Vein Velocities (all hepatofugal): Right:  53 cm/sec Middle:  73 cm/sec Left:  59 cm/sec Hepatic Artery Velocity: 56 cm/sec IVC:  48 cm/ sec intrahepatic segment Ascites: Present Right pleural effusion is also noted. IMPRESSION: 1.  Normal hepatic vascular Doppler evaluation. 2. Abdominal ascites and right pleural effusion. Electronically Signed   By: DLucrezia EuropeM.D.   On: 06/12/2016 16:49  BMET    Component Value Date/Time   NA 127 (L) 06/14/2016 0352   K 4.6 06/14/2016 0352   CL 90 (L) 06/14/2016 0352   CO2 25 06/14/2016 0352   GLUCOSE 104 (H) 06/14/2016 0352   BUN 77 (H) 06/14/2016 0352   CREATININE 3.57 (H) 06/14/2016 0352   CREATININE 1.32 (H) 05/30/2016 1336   CALCIUM 9.1 06/14/2016 0352   GFRNONAA 15 (L) 06/14/2016 0352   GFRAA 17 (L) 06/14/2016 0352   CBC    Component Value Date/Time   WBC 8.2 06/14/2016 0352   RBC 4.11 (L) 06/14/2016 0352   HGB 13.8 06/14/2016 0352   HCT 39.7 06/14/2016 0352   PLT 69 (L) 06/14/2016 0352   MCV 96.6 06/14/2016 0352   MCH 33.6 06/14/2016 0352   MCHC 34.8 06/14/2016 0352   RDW 15.4 06/14/2016 0352   LYMPHSABS 2.1 06/20/2016 2111    MONOABS 1.0 06/11/2016 2111   EOSABS 0.0 06/16/2016 2111   BASOSABS 0.0 07/05/2016 2111     Assessment: 1. Acute on CKD 3 (baseline Scr mid 1's), UO improved and Scr trending down 2. Acute on chronic systolic ht failure EF 17-00% 3. Hyperkalemia improved 4. Elevated LFT's due to shock liver 5. Hyponatemia  Plan: 1. Cont lasix. He is now diuresing fairly well.  Anticipated renal fx to recover unless becomes hemodynamically unstable 2. Daily labs.  He does not need BID chemistries, Mg or PO4 level??    Marrah Vanevery T

## 2016-06-14 NOTE — Progress Notes (Signed)
EEG completed, results pending. 

## 2016-06-14 NOTE — Progress Notes (Signed)
Pt placed back on full support due to increased WOB inc agitation, pt tolerating full support well, RT will monitor

## 2016-06-15 ENCOUNTER — Inpatient Hospital Stay (HOSPITAL_COMMUNITY): Payer: Medicare HMO

## 2016-06-15 DIAGNOSIS — J9601 Acute respiratory failure with hypoxia: Secondary | ICD-10-CM

## 2016-06-15 DIAGNOSIS — R570 Cardiogenic shock: Secondary | ICD-10-CM

## 2016-06-15 LAB — COMPREHENSIVE METABOLIC PANEL
ALBUMIN: 4 g/dL (ref 3.5–5.0)
ALT: 1567 U/L — AB (ref 17–63)
ALT: 633 U/L — AB (ref 17–63)
ANION GAP: 15 (ref 5–15)
AST: 2378 U/L — AB (ref 15–41)
AST: 227 U/L — ABNORMAL HIGH (ref 15–41)
Albumin: 3.2 g/dL — ABNORMAL LOW (ref 3.5–5.0)
Alkaline Phosphatase: 96 U/L (ref 38–126)
Alkaline Phosphatase: 83 U/L (ref 38–126)
Anion gap: 19 — ABNORMAL HIGH (ref 5–15)
BUN: 71 mg/dL — AB (ref 6–20)
BUN: 82 mg/dL — ABNORMAL HIGH (ref 6–20)
CHLORIDE: 89 mmol/L — AB (ref 101–111)
CO2: 15 mmol/L — ABNORMAL LOW (ref 22–32)
CO2: 29 mmol/L (ref 22–32)
CREATININE: 3.9 mg/dL — AB (ref 0.61–1.24)
CREATININE: 3.09 mg/dL — AB (ref 0.61–1.24)
Calcium: 9 mg/dL (ref 8.9–10.3)
Calcium: 8.7 mg/dL — ABNORMAL LOW (ref 8.9–10.3)
Chloride: 87 mmol/L — ABNORMAL LOW (ref 101–111)
GFR calc Af Amer: 16 mL/min — ABNORMAL LOW (ref >60)
GFR calc non Af Amer: 13 mL/min — ABNORMAL LOW (ref >60)
GFR, EST AFRICAN AMERICAN: 21 mL/min — AB (ref >60)
GFR, EST NON AFRICAN AMERICAN: 18 mL/min — AB (ref >60)
GLUCOSE: 148 mg/dL — AB (ref 65–99)
Glucose, Bld: 119 mg/dL — ABNORMAL HIGH (ref 65–99)
POTASSIUM: 6.7 mmol/L — AB (ref 3.5–5.1)
Potassium: 3.3 mmol/L — ABNORMAL LOW (ref 3.5–5.1)
SODIUM: 131 mmol/L — AB (ref 135–145)
Sodium: 123 mmol/L — ABNORMAL LOW (ref 135–145)
Total Bilirubin: 3.5 mg/dL — ABNORMAL HIGH (ref 0.3–1.2)
Total Bilirubin: 4 mg/dL — ABNORMAL HIGH (ref 0.3–1.2)
Total Protein: 6.2 g/dL — ABNORMAL LOW (ref 6.5–8.1)
Total Protein: 5.4 g/dL — ABNORMAL LOW (ref 6.5–8.1)

## 2016-06-15 LAB — BLOOD GAS, ARTERIAL
ACID-BASE EXCESS: 6 mmol/L — AB (ref 0.0–2.0)
Bicarbonate: 29.4 mmol/L — ABNORMAL HIGH (ref 20.0–28.0)
DRAWN BY: 44135
FIO2: 40
O2 SAT: 98.7 %
PEEP/CPAP: 5 cmH2O
PH ART: 7.495 — AB (ref 7.350–7.450)
Patient temperature: 98.6
RATE: 14 resp/min
VT: 500 mL
pCO2 arterial: 38.6 mmHg (ref 32.0–48.0)
pO2, Arterial: 132 mmHg — ABNORMAL HIGH (ref 83.0–108.0)

## 2016-06-15 LAB — BASIC METABOLIC PANEL
Anion gap: 15 (ref 5–15)
BUN: 88 mg/dL — AB (ref 6–20)
CHLORIDE: 89 mmol/L — AB (ref 101–111)
CO2: 31 mmol/L (ref 22–32)
CREATININE: 2.71 mg/dL — AB (ref 0.61–1.24)
Calcium: 8.8 mg/dL — ABNORMAL LOW (ref 8.9–10.3)
GFR calc non Af Amer: 21 mL/min — ABNORMAL LOW (ref >60)
GFR, EST AFRICAN AMERICAN: 24 mL/min — AB (ref >60)
Glucose, Bld: 182 mg/dL — ABNORMAL HIGH (ref 65–99)
POTASSIUM: 3.2 mmol/L — AB (ref 3.5–5.1)
SODIUM: 135 mmol/L (ref 135–145)

## 2016-06-15 LAB — CBC
HEMATOCRIT: 41.6 % (ref 39.0–52.0)
HEMOGLOBIN: 14.1 g/dL (ref 13.0–17.0)
MCH: 33.1 pg (ref 26.0–34.0)
MCHC: 33.9 g/dL (ref 30.0–36.0)
MCV: 97.7 fL (ref 78.0–100.0)
Platelets: 50 10*3/uL — ABNORMAL LOW (ref 150–400)
RBC: 4.26 MIL/uL (ref 4.22–5.81)
RDW: 15.9 % — ABNORMAL HIGH (ref 11.5–15.5)
WBC: 9 10*3/uL (ref 4.0–10.5)

## 2016-06-15 LAB — COOXEMETRY PANEL
Carboxyhemoglobin: 1.2 % (ref 0.5–1.5)
Methemoglobin: 1.1 % (ref 0.0–1.5)
O2 SAT: 75.2 %
TOTAL HEMOGLOBIN: 13.4 g/dL (ref 12.0–16.0)

## 2016-06-15 LAB — URINALYSIS, ROUTINE W REFLEX MICROSCOPIC
BILIRUBIN URINE: NEGATIVE
Bacteria, UA: NONE SEEN
Glucose, UA: NEGATIVE mg/dL
Ketones, ur: NEGATIVE mg/dL
LEUKOCYTES UA: NEGATIVE
NITRITE: NEGATIVE
PH: 6 (ref 5.0–8.0)
Protein, ur: NEGATIVE mg/dL
SQUAMOUS EPITHELIAL / LPF: NONE SEEN
Specific Gravity, Urine: 1.008 (ref 1.005–1.030)

## 2016-06-15 LAB — MAGNESIUM
MAGNESIUM: 2.3 mg/dL (ref 1.7–2.4)
MAGNESIUM: 2 mg/dL (ref 1.7–2.4)

## 2016-06-15 LAB — PHOSPHORUS
PHOSPHORUS: 3.6 mg/dL (ref 2.5–4.6)
PHOSPHORUS: 2.3 mg/dL — AB (ref 2.5–4.6)

## 2016-06-15 LAB — GLUCOSE, CAPILLARY
GLUCOSE-CAPILLARY: 124 mg/dL — AB (ref 65–99)
GLUCOSE-CAPILLARY: 168 mg/dL — AB (ref 65–99)
Glucose-Capillary: 106 mg/dL — ABNORMAL HIGH (ref 65–99)
Glucose-Capillary: 150 mg/dL — ABNORMAL HIGH (ref 65–99)
Glucose-Capillary: 160 mg/dL — ABNORMAL HIGH (ref 65–99)

## 2016-06-15 LAB — PROTIME-INR
INR: 1.97
PROTHROMBIN TIME: 22.7 s — AB (ref 11.4–15.2)

## 2016-06-15 LAB — PROCALCITONIN: PROCALCITONIN: 2.18 ng/mL

## 2016-06-15 MED ORDER — PIPERACILLIN-TAZOBACTAM IN DEX 2-0.25 GM/50ML IV SOLN
2.2500 g | Freq: Four times a day (QID) | INTRAVENOUS | Status: DC
  Administered 2016-06-16 (×3): 2.25 g via INTRAVENOUS
  Filled 2016-06-15 (×3): qty 50

## 2016-06-15 MED ORDER — AMIODARONE LOAD VIA INFUSION
150.0000 mg | Freq: Once | INTRAVENOUS | Status: AC
  Administered 2016-06-15: 150 mg via INTRAVENOUS
  Filled 2016-06-15: qty 83.34

## 2016-06-15 MED ORDER — AMIODARONE HCL IN DEXTROSE 360-4.14 MG/200ML-% IV SOLN
30.0000 mg/h | INTRAVENOUS | Status: DC
  Administered 2016-06-16: 30 mg/h via INTRAVENOUS
  Filled 2016-06-15: qty 200

## 2016-06-15 MED ORDER — AMIODARONE HCL IN DEXTROSE 360-4.14 MG/200ML-% IV SOLN
60.0000 mg/h | INTRAVENOUS | Status: DC
  Administered 2016-06-15 (×2): 60 mg/h via INTRAVENOUS
  Filled 2016-06-15 (×2): qty 200

## 2016-06-15 MED ORDER — POTASSIUM CHLORIDE 20 MEQ/15ML (10%) PO SOLN
40.0000 meq | Freq: Once | ORAL | Status: AC
  Administered 2016-06-15: 40 meq
  Filled 2016-06-15: qty 30

## 2016-06-15 MED ORDER — PIPERACILLIN-TAZOBACTAM 3.375 G IVPB 30 MIN
3.3750 g | Freq: Once | INTRAVENOUS | Status: AC
  Administered 2016-06-15: 3.375 g via INTRAVENOUS
  Filled 2016-06-15: qty 50

## 2016-06-15 MED ORDER — VASOPRESSIN 20 UNIT/ML IV SOLN
0.0300 [IU]/min | INTRAVENOUS | Status: DC
  Administered 2016-06-15 – 2016-06-16 (×2): 0.03 [IU]/min via INTRAVENOUS
  Filled 2016-06-15 (×3): qty 2

## 2016-06-15 MED ORDER — FUROSEMIDE 10 MG/ML IJ SOLN
80.0000 mg | Freq: Two times a day (BID) | INTRAMUSCULAR | Status: DC
  Administered 2016-06-15: 80 mg via INTRAVENOUS
  Filled 2016-06-15: qty 8

## 2016-06-15 MED ORDER — VANCOMYCIN HCL 10 G IV SOLR
1250.0000 mg | Freq: Once | INTRAVENOUS | Status: AC
  Administered 2016-06-15: 1250 mg via INTRAVENOUS
  Filled 2016-06-15: qty 1250

## 2016-06-15 MED ORDER — SODIUM CHLORIDE 0.9 % IV SOLN
500.0000 mg | Freq: Two times a day (BID) | INTRAVENOUS | Status: DC
  Administered 2016-06-15 – 2016-06-19 (×9): 500 mg via INTRAVENOUS
  Filled 2016-06-15 (×10): qty 5

## 2016-06-15 MED ORDER — VANCOMYCIN HCL IN DEXTROSE 1-5 GM/200ML-% IV SOLN: 1000.0000 mg | INTRAVENOUS | Status: DC

## 2016-06-15 NOTE — Progress Notes (Addendum)
Advanced Heart Failure Rounding Note   Subjective:    3/8 Intubated, then developed left PTX thought to be due to positive pressure ventilation. CT placed.    Remains on dobutamine 2. Brisk diuresis noted. Weight down 11 pounds. Creatinine improving. Renal has cut back diuretics. CVP 12-13.    He is off sedation since 8a but not waking up. Breathing over vent (on SBT) but completely non-responsive.   AF rate is up 110-120. SBP 80-90s  EEG: With diffuse slowing and focal left cerebral dysfunction   CO-OX 75% AST 25>4975>794>227 ALT 16>1803> 1057>633 Creatinine 2.24> 3.4>3.72>3.57 > 3.09  Objective:   Weight Range:  Vital Signs:   Temp:  [97.9 F (36.6 C)-99 F (37.2 C)] 98.9 F (37.2 C) (03/10 1559) Pulse Rate:  [93-139] 115 (03/10 1559) Resp:  [14-23] 20 (03/10 1559) BP: (76-113)/(48-91) 100/63 (03/10 1559) SpO2:  [97 %-100 %] 97 % (03/10 1559) FiO2 (%):  [40 %] 40 % (03/10 1559) Weight:  [67.8 kg (149 lb 7.6 oz)] 67.8 kg (149 lb 7.6 oz) (03/10 0438) Last BM Date: 06/13/16  Weight change: Filed Weights   06/13/16 1100 06/14/16 0400 06/15/16 0438  Weight: 74.7 kg (164 lb 10.9 oz) 73 kg (160 lb 15 oz) 67.8 kg (149 lb 7.6 oz)    Intake/Output:   Intake/Output Summary (Last 24 hours) at 06/15/16 1635 Last data filed at 06/15/16 1410  Gross per 24 hour  Intake          1378.75 ml  Output             5950 ml  Net         -4571.25 ml     Physical Exam: CVP 12-13 General:  Intubated, not responsive.  HEENT: normal Neck: supple. JVP to jaw . Carotids 2+ bilat; no bruits. No lymphadenopathy or thryomegaly appreciated. LIJ  Cor: PMI nondisplaced. IRR tachy Lungs: Rhonchi throughtout.  Abdomen: soft, nontender, nondistended. No hepatosplenomegaly. No bruits or masses.  Extremities: no cyanosis, clubbing, rash, edema. R and LLE SCDs. Diffuse echymosis Neuro: intubated, not responsive  Telemetry: personally reviewed. A fib 100-120s  Labs: Basic Metabolic  Panel:  Recent Labs Lab 06/11/16 1415  06/13/16 1010 06/13/16 1600 06/14/16 0352 06/14/16 0951 06/14/16 1700 06/15/16 0454  NA  --   < > 125* 126* 127*  --  128* 131*  K  --   < > 6.3* 5.9* 4.6  --  3.5 3.3*  CL  --   < > 89* 91* 90*  --  87* 87*  CO2  --   < > 16* 18* 25  --  27 29  GLUCOSE  --   < > 152* 159* 104*  --  129* 119*  BUN  --   < > 70* 75* 77*  --  76* 82*  CREATININE  --   < > 3.89* 3.78* 3.57*  --  3.23* 3.09*  CALCIUM  --   < > 9.2 9.0 9.1  --  8.9 8.7*  MG 2.4  --   --   --  3.0* 2.8* 2.6* 2.3  PHOS  --   --   --   --  5.7* 5.1* 4.1 3.6  < > = values in this interval not displayed.  Liver Function Tests:  Recent Labs Lab 06/12/16 0543 06/12/16 1342 06/13/16 0833 06/14/16 0352 06/15/16 0454  AST 3,202* 4,975* 2,378* 794* 227*  ALT 1,131* 1,803* 1,567* 1,057* 633*  ALKPHOS 97 99 96 85 83  BILITOT 4.1* 3.8* 3.5* 3.3* 4.0*  PROT 7.1 6.9 6.2* 5.7* 5.4*  ALBUMIN 4.4 4.3 4.0 3.5 3.2*    Recent Labs Lab 06/07/2016 2111  LIPASE 13    Recent Labs Lab 06/11/16 0511 06/12/16 1342 06/13/16 1010  AMMONIA 67* 45* 38*    CBC:  Recent Labs Lab 06/30/2016 2111 06/10/16 1412 06/14/16 0352 06/15/16 0454  WBC 10.4 10.8* 8.2 9.0  NEUTROABS 7.2  --   --   --   HGB 15.8 15.7 13.8 14.1  HCT 46.5 47.0 39.7 41.6  MCV 100.2* 99.8 96.6 97.7  PLT 152 153 69* 50*    Cardiac Enzymes:  Recent Labs Lab 06/20/2016 2111 06/12/16 1342 06/12/16 1530 06/13/16 1010 06/13/16 1600 06/13/16 2157  CKTOTAL  --   --  38*  --   --   --   TROPONINI 0.06* 0.38*  --  0.25* 0.26* 0.22*    BNP: BNP (last 3 results)  Recent Labs  05/30/16 1336 07/02/2016 2111 06/12/16 1343  BNP 1,104.3* 1,879.0* 2,370.0*    ProBNP (last 3 results) No results for input(s): PROBNP in the last 8760 hours.    Other results:  Imaging: Dg Chest Port 1 View  Result Date: 06/15/2016 CLINICAL DATA:  82 year old male with hyponatremia, falls. Intubated with subsequent left  pneumothorax, treated with left chest tube. EXAM: PORTABLE CHEST 1 VIEW COMPARISON:  06/14/2016 and earlier FINDINGS: Portable AP semi upright view at 0518 hours. Stable left chest tube. Small left apical pneumothorax re - demonstrated and not significantly changed since yesterday. Stable endotracheal tube. Stable visible enteric tube. Left chest cardiac AICD. Stable cardiomegaly and mediastinal contours. Calcified aortic atherosclerosis. Stable veiling opacity at the right lung base. No pneumothorax or pulmonary edema. The left lung is clear. IMPRESSION: 1.  Stable lines and tubes. 2. Small left apical pneumothorax has not significantly changed. 3. Stable small right pleural effusion with right lower lobe collapse or consolidation. Electronically Signed   By: Genevie Ann M.D.   On: 06/15/2016 08:31   Dg Chest Port 1 View  Result Date: 06/14/2016 CLINICAL DATA:  82 year old male with hyponatremia, falls. Intubated with subsequent left pneumothorax, treated with left chest tube. EXAM: PORTABLE CHEST 1 VIEW COMPARISON:  06/13/2016 and earlier. FINDINGS: Portable AP semi upright view at 0406 hours. Left chest tube remains in place. Trace left apical pneumothorax visible today. Stable endotracheal tube tip between the level the clavicles and carina. Enteric tube courses to the abdomen and loops mildly in the proximal stomach before continuing, tip not included. Stable left chest cardiac AICD. Stable left IJ central line. Increased veiling opacity at the right lung base. Stable cardiac size and mediastinal contours. Calcified aortic atherosclerosis. No pulmonary edema. No left pleural effusion identified. Negative visible bowel gas pattern. IMPRESSION: 1.  Stable lines and tubes. 2. Trace left apical pneumothorax. 3. Increased small right pleural effusion. Electronically Signed   By: Genevie Ann M.D.   On: 06/14/2016 06:38   Dg Chest Port 1 View  Result Date: 06/13/2016 CLINICAL DATA:  Ventilator dependent respiratory  failure. Bedside central venous catheter placement. EXAM: PORTABLE CHEST 1 VIEW 7:23 p.m.: COMPARISON:  Portable chest x-ray earlier today 7:04 p.m., 5:57 p.m., 5:52 p.m. and previously. FINDINGS: New left jugular central venous catheter tip projects over the mid SVC. No evidence of left pneumothorax with the left chest tube in place. Endotracheal tube tip projects approximately 4 cm above the carina. Feeding 2 courses below the diaphragm into the stomach its tip is  not included on the image. Cardiac silhouette markedly enlarged, unchanged. No evidence of mediastinal hematoma. Mild atelectasis in the lung bases. Mild pulmonary venous hypertension without overt edema. No confluent airspace consolidation. IMPRESSION: 1. Left jugular central venous catheter tip projects over the mid SVC. No acute complicating features. 2. Remaining support apparatus satisfactory. 3. Left chest tube in place with no pneumothorax. 4. Marked cardiomegaly. Pulmonary venous hypertension without overt edema. 5. Mild bibasilar atelectasis. Electronically Signed   By: Evangeline Dakin M.D.   On: 06/13/2016 19:41   Dg Chest Port 1 View  Result Date: 06/13/2016 CLINICAL DATA:  82 y/o  M; chest tube for follow-up. EXAM: PORTABLE CHEST 1 VIEW COMPARISON:  None. FINDINGS: Stable cardiomegaly given projection and technique. Single lead AICD. Enteric tube tip is below the field of view and abdomen. Endotracheal tube tip 3.1 cm from carina. Aortic atherosclerosis with calcification. Interval placement of a left-sided chest tube with trace residual left-sided pneumothorax. Linear opacities at lung bases may represent vascular congestion or minor atelectasis. IMPRESSION: Interval placement of left chest tube with re-expansion of lung and trace residual left-sided pneumothorax. Linear opacities at lung bases may represent minor atelectasis or vascular congestion. Electronically Signed   By: Kristine Garbe M.D.   On: 06/13/2016 19:19   Dg  Chest Port 1 View  Result Date: 06/13/2016 CLINICAL DATA:  Endotracheal tube retraction 2 cm EXAM: PORTABLE CHEST 1 VIEW COMPARISON:  1752 hours on the same day FINDINGS: Unchanged left-sided large pneumothorax. Stable cardiomegaly with aortic atherosclerosis. Endotracheal tube tip is approximately 4.7 cm above the carina in satisfactory position. Gastric tube projects over the left upper quadrant of the abdomen. Left-sided pacemaker apparatus with right ventricular defibrillator lead is noted. No significant change in the appearance of the mediastinum with slight left-to-right shift. Right lung is clear and expanded without consolidation. Osteoarthritic change noted about the [facility] and glenohumeral joints. IMPRESSION: 1. Endotracheal tube tip is approximately 4.7 cm above the carina. 2. Relatively unchanged large left-sided pneumothorax. This finding was previously called to NP [facility] by Dr. Vernard Gambles on the CXR immediately preceding this exam. 3. Stable cardiomegaly with aortic atherosclerosis. 4. Gastric tube extends into the expected location of the stomach. Electronically Signed   By: Ashley Royalty M.D.   On: 06/13/2016 18:22   Dg Chest Port 1 View  Result Date: 06/13/2016 CLINICAL DATA:  Pt intubated; ETT placement EXAM: PORTABLE CHEST - 1 VIEW COMPARISON:  the previous day's study FINDINGS: There is a large left pneumothorax which is new since the previous exam. There is may be some slight rightward mediastinal shift, and there is new depression of the left diaphragmatic leaflet. Endotracheal tube has been placed with its tip approximately 4.7 cm above carina. Feeding tube advanced at least as far as the stomach, tip not seen. Right lung remains clear. Mild cardiomegaly stable. Atheromatous aorta. Left subclavian transvenous AICD stable. No effusion, although the left cardiophrenic angle is excluded. Visualized bones unremarkable. IMPRESSION: 1. Large left pneumothorax, new since previous film, with early  signs of tension. Critical Value/emergent results were called by telephone at the time of interpretation on 06/13/2016 at 6:20 pm to [facility] NP, who verbally acknowledged these results. 2. Endotracheal tube and feeding tube in expected location. Electronically Signed   By: Lucrezia Europe M.D.   On: 06/13/2016 18:21     Medications:     Scheduled Medications: . chlorhexidine gluconate (MEDLINE KIT)  15 mL Mouth Rinse BID  . Chlorhexidine Gluconate Cloth  6 each  Topical Daily  . famotidine  20 mg Per Tube Daily  . feeding supplement (PRO-STAT SUGAR FREE 64)  60 mL Per Tube BID  . feeding supplement (VITAL 1.5 CAL)  1,000 mL Per Tube Q24H  . furosemide  80 mg Intravenous Q12H  . insulin aspart  2-6 Units Subcutaneous Q4H  . levETIRAcetam  500 mg Intravenous Q12H  . mouth rinse  15 mL Mouth Rinse 10 times per day  . sodium chloride flush  10-40 mL Intracatheter Q12H  . sodium chloride flush  3 mL Intravenous Q12H    Infusions: . DOBUTamine 2 mcg/kg/min (06/12/16 1654)  . propofol (DIPRIVAN) infusion Stopped (06/15/16 0804)    PRN Medications: fentaNYL (SUBLIMAZE) injection, ipratropium-albuterol, MUSCLE RUB, [DISCONTINUED] ondansetron **OR** ondansetron (ZOFRAN) IV, sodium chloride flush   Assessment/Plan/Discussin     1.  A/C Biventricular Heart Failure -> cardiogenic shock --echo 3/5 EF 15-20% RV moderately HK. ICM. (EF similar to past) --co-ox looks good on dobutamine 2. Will decrease to 1 as BP tolerates. May need to switch to levophed  --volume status improving. CVP still 12-13.Renal decreased diuretics --shock liver and shock kidney improving --no blocker or ACE/ARB yet.  2. CAD with ICM --No chest pain.  On ASA 81.  No statin with markedly elevated LFTs.  --No recent cath. Myoview 2/14. Extensive scar LAD and RCA territories. Not gated due to AF 4. AKI on CKD - Likely ATN due to hypotension/shock. Cardiorenal syndrome - Nephrology following. Creatinine coming down on  inotropes  5. Shock liver - GI following. [place] Abd/Kidney completed. No acute findings. Starting to trend down.  6. AMS/Lethargy - Intubated 3/8. Start pepcid.  7. Chronic A fib - rate now up. Unable to use b-blocker with low BP/shock. Will add low-dose amio (careful with liver dysfunction). Cut back dobutamine.  - not anticoagulated due to falls. SCDs in place.  8. Acute respiratory failure and tension PTX -s/p chest tube - CCM following. Tolerating SBT today 9. DMII - on sliding scale.  10. INR supratherapeutic--> INR trending down to 2.0. Using SCDs.  11. Anoxic brain injury -- Neurology following. Results of EEG reviewed. Will continue to follow with sedation off. Progrnosis seems poor.  14. Fall - H/O ICH from fall. Admitted after fall. CT of head negative for bleed.  13. LBBB- chronic  Remains critically ill. Not respondive. EEG concerning. Continue vent support. Hold sedation.   Volume status improved. Co-ox looks good. Renal and hepatic function improving. But HR now fast and BP low. I worry about possible impending sepsis. WBC 9k. Will pan culture. Low threshold to start abx and add vasopressin.   The patient is critically ill with multiple organ systems failure and requires high complexity decision making for assessment and support, frequent evaluation and titration of therapies, application of advanced monitoring technologies and extensive interpretation of multiple databases.   Critical Care Time devoted to patient care services described in this note is 35 Minutes.   Length of Stay: 6 Akyra Bouchie MD 06/15/2016, 4:35 PM  Advanced Heart Failure Team Pager [phone] (M-F; 7a - 4p)  Please contact [facility] for night-coverage after hours (4p -7a ) and weekends on [email or web address]  Addendum:  Patient with increasing hypotension and tachycardia. Co-ox elevated. PCT 2.18. UA negative, Sputum GS: ABUNDANT WBC PRESENT, PREDOMINANTLY PMN  RARE SQUAMOUS EPITHELIAL CELLS  PRESENT  ABUNDANT GRAM POSITIVE COCCI IN PAIRS IN CLUSTERS  MODERATE GRAM NEGATIVE RODS  RARE GRAM POSITIVE RODS   Will start vanc/zosyn. Low threshold to  add vasopressin   Korinna Tat,MD 9:58 PM

## 2016-06-15 NOTE — Progress Notes (Signed)
Pharmacy to identify drug-drug interactions and recommend medication adjustments for those altered by adding amiodarone to the medication regimen (i.e. statins, digoxin, oral hypoglycemics, warfarin.)   Amiodarone Drug - Drug Interaction Consult Note  Recommendations: Monitor for hypokalemia due to effect of diuretic.    Amiodarone is metabolized by the cytochrome P450 system and therefore has the potential to cause many drug interactions. Amiodarone has an average plasma half-life of 50 days (range 20 to 100 days).   There is potential for drug interactions to occur several weeks or months after stopping treatment and the onset of drug interactions may be slow after initiating amiodarone.   []  Statins: Increased risk of myopathy. Simvastatin- restrict dose to 20mg  daily. Other statins: counsel patients to report any muscle pain or weakness immediately.  []  Anticoagulants: Amiodarone can increase anticoagulant effect. Consider warfarin dose reduction. Patients should be monitored closely and the dose of anticoagulant altered accordingly, remembering that amiodarone levels take several weeks to stabilize.  []  Antiepileptics: Amiodarone can increase plasma concentration of phenytoin, the dose should be reduced. Note that small changes in phenytoin dose can result in large changes in levels. Monitor patient and counsel on signs of toxicity.  []  Beta blockers: increased risk of bradycardia, AV block and myocardial depression. Sotalol - avoid concomitant use.  []   Calcium channel blockers (diltiazem and verapamil): increased risk of bradycardia, AV block and myocardial depression.  []   Cyclosporine: Amiodarone increases levels of cyclosporine. Reduced dose of cyclosporine is recommended.  []  Digoxin dose should be halved when amiodarone is started.  [x]  Diuretics: increased risk of cardiotoxicity if hypokalemia occurs.  []  Oral hypoglycemic agents (glyburide, glipizide, glimepiride):  increased risk of hypoglycemia. Patient's glucose levels should be monitored closely when initiating amiodarone therapy.   []  Drugs that prolong the QT interval:  Torsades de pointes risk may be increased with concurrent use - avoid if possible.  Monitor QTc, also keep magnesium/potassium WNL if concurrent therapy can't be avoided. Marland Kitchen Antibiotics: e.g. fluoroquinolones, erythromycin. . Antiarrhythmics: e.g. quinidine, procainamide, disopyramide, sotalol. . Antipsychotics: e.g. phenothiazines, haloperidol.  . Lithium, tricyclic antidepressants, and methadone.   Thank You,  Noah Delaine, RPh Clinical Pharmacist [facility] [phone] 06/15/2016 5:45 PM

## 2016-06-15 NOTE — Progress Notes (Signed)
PULMONARY / CRITICAL CARE MEDICINE   Name: [name] MRN: [id number] DOB: [birth year]    ADMISSION DATE:  07/01/2016 CONSULTATION DATE:  06/13/2016  REFERRING MD:  Dr. Caleb Popp   CHIEF COMPLAINT:  Acute on Chronic HF  HISTORY OF PRESENT ILLNESS:   82 yo male presented to [facility] 3/05 after fall.  He was recently started on lamictal for seizures.  Transferred to [facility] for Rt heart cath.  SUBJECTIVE:  Tolerating SBT.  Remains on dobutamine.  VITAL SIGNS: BP (!) 85/54   Pulse (!) 101   Temp 98.6 F (37 C) (Oral)   Resp 14   Ht 5\' 4"  (1.626 m)   Wt 149 lb 7.6 oz (67.8 kg)   SpO2 97%   BMI 25.66 kg/m   HEMODYNAMICS: CVP:  [3 mmHg-14 mmHg] 3 mmHg  INTAKE / OUTPUT: I/O last 3 completed shifts: In: 1501.9 [I.V.:272.1; Blood:319; NG/GT:662.8; IV Piggyback:248] Out: 8570 [Urine:8520; Chest Tube:50]  PHYSICAL EXAMINATION: General: sedated Neuro: RASS -3 HEENT: pupils reactive, ETT in place Cardiac: irregular Chest: no wheeze Abd: soft, non tender Ext: no edema Skin: no rashes  LABS:  BMET  Recent Labs Lab 06/14/16 0352 06/14/16 1700 06/15/16 0454  NA 127* 128* 131*  K 4.6 3.5 3.3*  CL 90* 87* 87*  CO2 25 27 29   BUN 77* 76* 82*  CREATININE 3.57* 3.23* 3.09*  GLUCOSE 104* 129* 119*    Electrolytes  Recent Labs Lab 06/14/16 0352 06/14/16 0951 06/14/16 1700 06/15/16 0454  CALCIUM 9.1  --  8.9 8.7*  MG 3.0* 2.8* 2.6* 2.3  PHOS 5.7* 5.1* 4.1 3.6    CBC  Recent Labs Lab 06/10/16 1412 06/14/16 0352 06/15/16 0454  WBC 10.8* 8.2 9.0  HGB 15.7 13.8 14.1  HCT 47.0 39.7 41.6  PLT 153 69* 50*    Coag's  Recent Labs Lab 06/13/16 0833 06/14/16 0352 06/15/16 0454  INR 2.94 2.06 1.97    Sepsis Markers No results for input(s): LATICACIDVEN, PROCALCITON, O2SATVEN in the last 168 hours.  ABG  Recent Labs Lab 06/12/16 1730 06/13/16 1445 06/15/16 0357  PHART 7.347* 7.478* 7.495*  PCO2ART 25.8* 26.2* 38.6  PO2ART 90.0 102.0 132*    Liver  Enzymes  Recent Labs Lab 06/13/16 0833 06/14/16 0352 06/15/16 0454  AST 2,378* 794* 227*  ALT 1,567* 1,057* 633*  ALKPHOS 96 85 83  BILITOT 3.5* 3.3* 4.0*  ALBUMIN 4.0 3.5 3.2*    Cardiac Enzymes  Recent Labs Lab 06/13/16 1010 06/13/16 1600 06/13/16 2157  TROPONINI 0.25* 0.26* 0.22*    Glucose  Recent Labs Lab 06/14/16 1120 06/14/16 1730 06/14/16 2056 06/14/16 2117 06/14/16 2354 06/15/16 0447  GLUCAP 121* 133* 116* 125* 130* 106*    Imaging No results found.  STUDIES:  CT C Spine 3/4 > Neg acute injury  CT Head 3/4 > No acute of intracranial or cervical spine injury  ECHO 3/5 > 15-20% EF, RV moderately dilated with mild to moderately decreases systolic function  ABD [place] 3/7 > Normal hepatic vascular doppler evaluation, abdominal ascites and right pleural effusion  Renal US 3/7 > No acute findings, renal cortical atrophy and possible chronic medical rental disease, suspected nonobstructive left lower pole renal calculus, cirrhotic appearing liver with small to moderate volume ascites  SIGNIFICANT EVENTS: 3/4 ED for fall/SI  3/7 Transfer to [facility] for cardiogenic shock, multiorgan failure 3/8 Transfer to ICU, intubated, Lt CT placed for pneumothorax, Lt IJ placed  LINES/TUBES: OETT 3/8 >> Lt chest tube 3/8 >> Lt IJ  CVC 3/8 >>  DISCUSSION: 82 year old male with extensive cardiac and renal history. Admitted 3/4 post-fall with suicidal ideation. Admission complicated by acute on chronic systolic HF, acute hepatic injury, and acute on chronic kidney disease.   ASSESSMENT / PLAN:  Acute hypoxic respiratory failure in setting of cardiogenic shock. Lt tension PTX. Hx of OSA. - pressure support wean >> hopefully extubate soon - chest tube to suction - f/u CXR  Acute on chronic systolic CHF (EF 15 to 20%) Cardiogenic shock. Hx of CAD, HTN, HLD. Hx of A fib >> no anticoagulation due to frequent falls. - dobutamine per cardiology - hold outpt pravachol in  setting of hepatic injury  AKI. Hx of CKD 3. - continue lasix per nephrology  Acute hepatic injury 2nd to cardiogenic shock. Hx of cirrhosis. - f/u LFTs  Hx of DM. - SSI  Acute metabolic encephalopathy. Hx of seizures. - add keppra  Suicidal ideation with hx of anxiety, depression. - will need psych evaluation after extubation  DVT prophylaxis - SCDs SUP - Pepcid Nutrition - tube feeds Goals of care - full code  CC time 31 minutes  Coralyn Helling, MD [facility] Pulmonary/Critical Care 06/15/2016, 8:12 AM Pager:  [phone] After 3pm call: [phone]

## 2016-06-15 NOTE — Progress Notes (Addendum)
Pharmacy Antibiotic Note  [name] is a 82 y.o. male  With r/o  sepsis.  Pharmacy has been consulted for vancomycin and zosyn dosing. -WBC= 9, afebrile, SCr= 2.71 (trend down; noted 1.3-1.6 earlier this year) CrCl ~ 20  Plan: -Zosyn 3.375gm x1 followed by 2.275gm IV q6h -Vancomycin 1250mg  IV x1 followed by 1000mg  IV q48hr -Will follow renal function, cultures and clinical progress   Height: 5\' 4"  (162.6 cm) Weight: 149 lb 7.6 oz (67.8 kg) IBW/kg (Calculated) : 59.2  Temp (24hrs), Avg:98.4 F (36.9 C), Min:97.9 F (36.6 C), Max:98.9 F (37.2 C)   Recent Labs Lab 06/26/16 2111  06/10/16 1412  06/13/16 1600 06/14/16 0352 06/14/16 1700 06/15/16 0454 06/15/16 1727  WBC 10.4  --  10.8*  --   --  8.2  --  9.0  --   CREATININE 1.59*  < >  --   < > 3.78* 3.57* 3.23* 3.09* 2.71*  < > = values in this interval not displayed.  Estimated Creatinine Clearance: 18.5 mL/min (by C-G formula based on SCr of 2.71 mg/dL (H)).    Allergies  Allergen Reactions  . Novocain [Procaine] Nausea And Vomiting and Other (See Comments)    Passes out (also)  . Lipitor [Atorvastatin] Other (See Comments)    Myalgias    . Procaine Hcl Nausea And Vomiting  . Tramadol Itching and Nausea And Vomiting    Antimicrobials this admission: Zosyn 3/10>> vanc 3/10>>  Dose adjustments this admission:   Microbiology results: 3/10 resp 3/10 blood 3/10 urine   Thank you for allowing pharmacy to be a part of this patient's care.  Harland German, Pharm D 06/15/2016 8:29 PM

## 2016-06-15 NOTE — Progress Notes (Signed)
S: intubated O:BP 94/62 (BP Location: Right Arm)   Pulse (!) 132   Temp 98.4 F (36.9 C) (Oral)   Resp 20   Ht '5\' 4"'$  (1.626 m)   Wt 67.8 kg (149 lb 7.6 oz)   SpO2 98%   BMI 25.66 kg/m   Intake/Output Summary (Last 24 hours) at 06/15/16 1025 Last data filed at 06/15/16 1014  Gross per 24 hour  Intake           1156.8 ml  Output             5925 ml  Net          -4768.2 ml   Weight change: -6.9 kg (-15 lb 3.4 oz) Gen: Intubated and sedated CVS: Irreg, irreg, tachy Resp: Scattered rhonchi Abd:+ BS ND No HSM Ext: + edema  NEURO: Sedated   . chlorhexidine gluconate (MEDLINE KIT)  15 mL Mouth Rinse BID  . Chlorhexidine Gluconate Cloth  6 each Topical Daily  . famotidine  20 mg Per Tube Daily  . feeding supplement (PRO-STAT SUGAR FREE 64)  60 mL Per Tube BID  . feeding supplement (VITAL 1.5 CAL)  1,000 mL Per Tube Q24H  . furosemide  120 mg Intravenous Q8H  . insulin aspart  2-6 Units Subcutaneous Q4H  . levETIRAcetam  500 mg Intravenous Q12H  . mouth rinse  15 mL Mouth Rinse 10 times per day  . sodium chloride flush  10-40 mL Intracatheter Q12H  . sodium chloride flush  3 mL Intravenous Q12H   Dg Chest Port 1 View  Result Date: 06/15/2016 CLINICAL DATA:  82 year old male with hyponatremia, falls. Intubated with subsequent left pneumothorax, treated with left chest tube. EXAM: PORTABLE CHEST 1 VIEW COMPARISON:  06/14/2016 and earlier FINDINGS: Portable AP semi upright view at 0518 hours. Stable left chest tube. Small left apical pneumothorax re - demonstrated and not significantly changed since yesterday. Stable endotracheal tube. Stable visible enteric tube. Left chest cardiac AICD. Stable cardiomegaly and mediastinal contours. Calcified aortic atherosclerosis. Stable veiling opacity at the right lung base. No pneumothorax or pulmonary edema. The left lung is clear. IMPRESSION: 1.  Stable lines and tubes. 2. Small left apical pneumothorax has not significantly changed. 3. Stable  small right pleural effusion with right lower lobe collapse or consolidation. Electronically Signed   By: Genevie Ann M.D.   On: 06/15/2016 08:31   Dg Chest Port 1 View  Result Date: 06/14/2016 CLINICAL DATA:  82 year old male with hyponatremia, falls. Intubated with subsequent left pneumothorax, treated with left chest tube. EXAM: PORTABLE CHEST 1 VIEW COMPARISON:  06/13/2016 and earlier. FINDINGS: Portable AP semi upright view at 0406 hours. Left chest tube remains in place. Trace left apical pneumothorax visible today. Stable endotracheal tube tip between the level the clavicles and carina. Enteric tube courses to the abdomen and loops mildly in the proximal stomach before continuing, tip not included. Stable left chest cardiac AICD. Stable left IJ central line. Increased veiling opacity at the right lung base. Stable cardiac size and mediastinal contours. Calcified aortic atherosclerosis. No pulmonary edema. No left pleural effusion identified. Negative visible bowel gas pattern. IMPRESSION: 1.  Stable lines and tubes. 2. Trace left apical pneumothorax. 3. Increased small right pleural effusion. Electronically Signed   By: Genevie Ann M.D.   On: 06/14/2016 06:38   Dg Chest Port 1 View  Result Date: 06/13/2016 CLINICAL DATA:  Ventilator dependent respiratory failure. Bedside central venous catheter placement. EXAM: PORTABLE CHEST 1 VIEW 7:23 p.m.:  COMPARISON:  Portable chest x-ray earlier today 7:04 p.m., 5:57 p.m., 5:52 p.m. and previously. FINDINGS: New left jugular central venous catheter tip projects over the mid SVC. No evidence of left pneumothorax with the left chest tube in place. Endotracheal tube tip projects approximately 4 cm above the carina. Feeding 2 courses below the diaphragm into the stomach its tip is not included on the image. Cardiac silhouette markedly enlarged, unchanged. No evidence of mediastinal hematoma. Mild atelectasis in the lung bases. Mild pulmonary venous hypertension without overt  edema. No confluent airspace consolidation. IMPRESSION: 1. Left jugular central venous catheter tip projects over the mid SVC. No acute complicating features. 2. Remaining support apparatus satisfactory. 3. Left chest tube in place with no pneumothorax. 4. Marked cardiomegaly. Pulmonary venous hypertension without overt edema. 5. Mild bibasilar atelectasis. Electronically Signed   By: Hulan Saas M.D.   On: 06/13/2016 19:41   Dg Chest Port 1 View  Result Date: 06/13/2016 CLINICAL DATA:  82 y/o  M; chest tube for follow-up. EXAM: PORTABLE CHEST 1 VIEW COMPARISON:  None. FINDINGS: Stable cardiomegaly given projection and technique. Single lead AICD. Enteric tube tip is below the field of view and abdomen. Endotracheal tube tip 3.1 cm from carina. Aortic atherosclerosis with calcification. Interval placement of a left-sided chest tube with trace residual left-sided pneumothorax. Linear opacities at lung bases may represent vascular congestion or minor atelectasis. IMPRESSION: Interval placement of left chest tube with re-expansion of lung and trace residual left-sided pneumothorax. Linear opacities at lung bases may represent minor atelectasis or vascular congestion. Electronically Signed   By: Mitzi Hansen M.D.   On: 06/13/2016 19:19   Dg Chest Port 1 View  Result Date: 06/13/2016 CLINICAL DATA:  Endotracheal tube retraction 2 cm EXAM: PORTABLE CHEST 1 VIEW COMPARISON:  1752 hours on the same day FINDINGS: Unchanged left-sided large pneumothorax. Stable cardiomegaly with aortic atherosclerosis. Endotracheal tube tip is approximately 4.7 cm above the carina in satisfactory position. Gastric tube projects over the left upper quadrant of the abdomen. Left-sided pacemaker apparatus with right ventricular defibrillator lead is noted. No significant change in the appearance of the mediastinum with slight left-to-right shift. Right lung is clear and expanded without consolidation. Osteoarthritic change  noted about the [facility] and glenohumeral joints. IMPRESSION: 1. Endotracheal tube tip is approximately 4.7 cm above the carina. 2. Relatively unchanged large left-sided pneumothorax. This finding was previously called to NP [facility] by Dr. Deanne Coffer on the CXR immediately preceding this exam. 3. Stable cardiomegaly with aortic atherosclerosis. 4. Gastric tube extends into the expected location of the stomach. Electronically Signed   By: Tollie Eth M.D.   On: 06/13/2016 18:22   Dg Chest Port 1 View  Result Date: 06/13/2016 CLINICAL DATA:  Pt intubated; ETT placement EXAM: PORTABLE CHEST - 1 VIEW COMPARISON:  the previous day's study FINDINGS: There is a large left pneumothorax which is new since the previous exam. There is may be some slight rightward mediastinal shift, and there is new depression of the left diaphragmatic leaflet. Endotracheal tube has been placed with its tip approximately 4.7 cm above carina. Feeding tube advanced at least as far as the stomach, tip not seen. Right lung remains clear. Mild cardiomegaly stable. Atheromatous aorta. Left subclavian transvenous AICD stable. No effusion, although the left cardiophrenic angle is excluded. Visualized bones unremarkable. IMPRESSION: 1. Large left pneumothorax, new since previous film, with early signs of tension. Critical Value/emergent results were called by telephone at the time of interpretation on 06/13/2016 at 6:20  pm to [facility] NP, who verbally acknowledged these results. 2. Endotracheal tube and feeding tube in expected location. Electronically Signed   By: Lucrezia Europe M.D.   On: 06/13/2016 18:21   Dg Abd Portable 1v  Result Date: 06/13/2016 CLINICAL DATA:  NG tube placement. EXAM: PORTABLE ABDOMEN - 1 VIEW COMPARISON:  CT abdomen and pelvis 12/15/2013 FINDINGS: Large caliber enteric tube terminates in the right upper quadrant in the region of the proximal duodenum or possibly distal stomach. Gas is present in loops of grossly nondilated small and  large bowel. Cholecystectomy clips and an ICD lead are noted. The visualized lung bases are grossly clear. IMPRESSION: Enteric tube terminates at the expected level of the proximal duodenum. Electronically Signed   By: Logan Bores M.D.   On: 06/13/2016 16:07   BMET    Component Value Date/Time   NA 131 (L) 06/15/2016 0454   K 3.3 (L) 06/15/2016 0454   CL 87 (L) 06/15/2016 0454   CO2 29 06/15/2016 0454   GLUCOSE 119 (H) 06/15/2016 0454   BUN 82 (H) 06/15/2016 0454   CREATININE 3.09 (H) 06/15/2016 0454   CREATININE 1.32 (H) 05/30/2016 1336   CALCIUM 8.7 (L) 06/15/2016 0454   GFRNONAA 18 (L) 06/15/2016 0454   GFRAA 21 (L) 06/15/2016 0454   CBC    Component Value Date/Time   WBC 9.0 06/15/2016 0454   RBC 4.26 06/15/2016 0454   HGB 14.1 06/15/2016 0454   HCT 41.6 06/15/2016 0454   PLT 50 (L) 06/15/2016 0454   MCV 97.7 06/15/2016 0454   MCH 33.1 06/15/2016 0454   MCHC 33.9 06/15/2016 0454   RDW 15.9 (H) 06/15/2016 0454   LYMPHSABS 2.1 06/26/2016 2111   MONOABS 1.0 07/06/2016 2111   EOSABS 0.0 07/05/2016 2111   BASOSABS 0.0 06/22/2016 2111     Assessment: 1. Acute on CKD 3 (baseline Scr mid 1's), UO improved and Scr trending down 2. Acute on chronic systolic ht failure EF 69-62% 3. Hyperkalemia improved, now K low 4. Elevated LFT's due to shock liver 5. Hyponatemia  Plan: 1. Decrease lasix dose 2. Replace K as you are doing 3. Daily Scr  [name] T

## 2016-06-15 NOTE — Progress Notes (Signed)
Called Elink to report low potassium value 3.2. Also called Elink  concerned about continued unresponsiveness. MD to call back, no new orders at this time, will continue to monitor.

## 2016-06-16 ENCOUNTER — Inpatient Hospital Stay (HOSPITAL_COMMUNITY): Payer: Medicare HMO

## 2016-06-16 DIAGNOSIS — Z885 Allergy status to narcotic agent status: Secondary | ICD-10-CM

## 2016-06-16 DIAGNOSIS — A412 Sepsis due to unspecified staphylococcus: Secondary | ICD-10-CM

## 2016-06-16 DIAGNOSIS — Z8249 Family history of ischemic heart disease and other diseases of the circulatory system: Secondary | ICD-10-CM

## 2016-06-16 DIAGNOSIS — Z95810 Presence of automatic (implantable) cardiac defibrillator: Secondary | ICD-10-CM

## 2016-06-16 DIAGNOSIS — Z888 Allergy status to other drugs, medicaments and biological substances status: Secondary | ICD-10-CM

## 2016-06-16 DIAGNOSIS — A4102 Sepsis due to Methicillin resistant Staphylococcus aureus: Secondary | ICD-10-CM

## 2016-06-16 DIAGNOSIS — R6520 Severe sepsis without septic shock: Secondary | ICD-10-CM

## 2016-06-16 DIAGNOSIS — Z9911 Dependence on respirator [ventilator] status: Secondary | ICD-10-CM

## 2016-06-16 DIAGNOSIS — Z95828 Presence of other vascular implants and grafts: Secondary | ICD-10-CM

## 2016-06-16 LAB — CBC
HCT: 42.2 % (ref 39.0–52.0)
Hemoglobin: 14.1 g/dL (ref 13.0–17.0)
MCH: 32.7 pg (ref 26.0–34.0)
MCHC: 33.4 g/dL (ref 30.0–36.0)
MCV: 97.9 fL (ref 78.0–100.0)
PLATELETS: 34 10*3/uL — AB (ref 150–400)
RBC: 4.31 MIL/uL (ref 4.22–5.81)
RDW: 16.2 % — AB (ref 11.5–15.5)
WBC: 7.9 10*3/uL (ref 4.0–10.5)

## 2016-06-16 LAB — BLOOD CULTURE ID PANEL (REFLEXED)
Acinetobacter baumannii: NOT DETECTED
CANDIDA GLABRATA: NOT DETECTED
CANDIDA KRUSEI: NOT DETECTED
CANDIDA TROPICALIS: NOT DETECTED
Candida albicans: NOT DETECTED
Candida parapsilosis: NOT DETECTED
ENTEROBACTER CLOACAE COMPLEX: NOT DETECTED
Enterobacteriaceae species: NOT DETECTED
Enterococcus species: NOT DETECTED
Escherichia coli: NOT DETECTED
Haemophilus influenzae: NOT DETECTED
KLEBSIELLA PNEUMONIAE: NOT DETECTED
Klebsiella oxytoca: NOT DETECTED
Listeria monocytogenes: NOT DETECTED
Methicillin resistance: NOT DETECTED
NEISSERIA MENINGITIDIS: NOT DETECTED
PROTEUS SPECIES: NOT DETECTED
Pseudomonas aeruginosa: NOT DETECTED
STAPHYLOCOCCUS SPECIES: DETECTED — AB
STREPTOCOCCUS SPECIES: NOT DETECTED
Serratia marcescens: NOT DETECTED
Staphylococcus aureus (BCID): DETECTED — AB
Streptococcus agalactiae: NOT DETECTED
Streptococcus pneumoniae: NOT DETECTED
Streptococcus pyogenes: NOT DETECTED

## 2016-06-16 LAB — URINE CULTURE: CULTURE: NO GROWTH

## 2016-06-16 LAB — COMPREHENSIVE METABOLIC PANEL
ALT: 324 U/L — AB (ref 17–63)
ANION GAP: 14 (ref 5–15)
AST: 75 U/L — ABNORMAL HIGH (ref 15–41)
Albumin: 2.5 g/dL — ABNORMAL LOW (ref 3.5–5.0)
Alkaline Phosphatase: 76 U/L (ref 38–126)
BUN: 87 mg/dL — ABNORMAL HIGH (ref 6–20)
CALCIUM: 7.7 mg/dL — AB (ref 8.9–10.3)
CO2: 29 mmol/L (ref 22–32)
Chloride: 92 mmol/L — ABNORMAL LOW (ref 101–111)
Creatinine, Ser: 2.51 mg/dL — ABNORMAL HIGH (ref 0.61–1.24)
GFR calc non Af Amer: 23 mL/min — ABNORMAL LOW (ref >60)
GFR, EST AFRICAN AMERICAN: 26 mL/min — AB (ref >60)
Glucose, Bld: 155 mg/dL — ABNORMAL HIGH (ref 65–99)
Potassium: 2.8 mmol/L — ABNORMAL LOW (ref 3.5–5.1)
SODIUM: 135 mmol/L (ref 135–145)
Total Bilirubin: 3.9 mg/dL — ABNORMAL HIGH (ref 0.3–1.2)
Total Protein: 4.8 g/dL — ABNORMAL LOW (ref 6.5–8.1)

## 2016-06-16 LAB — PROCALCITONIN: Procalcitonin: 3.08 ng/mL

## 2016-06-16 LAB — GLUCOSE, CAPILLARY
GLUCOSE-CAPILLARY: 147 mg/dL — AB (ref 65–99)
Glucose-Capillary: 184 mg/dL — ABNORMAL HIGH (ref 65–99)

## 2016-06-16 LAB — PROTIME-INR
INR: 1.89
PROTHROMBIN TIME: 22 s — AB (ref 11.4–15.2)

## 2016-06-16 LAB — TRIGLYCERIDES: Triglycerides: 125 mg/dL (ref <150)

## 2016-06-16 MED ORDER — POTASSIUM CHLORIDE 20 MEQ/15ML (10%) PO SOLN
40.0000 meq | Freq: Once | ORAL | Status: AC
  Administered 2016-06-16: 40 meq via ORAL
  Filled 2016-06-16: qty 30

## 2016-06-16 MED ORDER — AMIODARONE HCL IN DEXTROSE 360-4.14 MG/200ML-% IV SOLN
30.0000 mg/h | INTRAVENOUS | Status: DC
  Administered 2016-06-17 – 2016-06-18 (×5): 30 mg/h via INTRAVENOUS
  Filled 2016-06-16 (×6): qty 200

## 2016-06-16 MED ORDER — AMIODARONE HCL IN DEXTROSE 360-4.14 MG/200ML-% IV SOLN
INTRAVENOUS | Status: AC
  Filled 2016-06-16: qty 400

## 2016-06-16 MED ORDER — SODIUM CHLORIDE 0.9 % IV BOLUS (SEPSIS)
1000.0000 mL | Freq: Once | INTRAVENOUS | Status: AC
  Administered 2016-06-16: 1000 mL via INTRAVENOUS

## 2016-06-16 MED ORDER — CEFAZOLIN SODIUM-DEXTROSE 2-4 GM/100ML-% IV SOLN
2.0000 g | Freq: Two times a day (BID) | INTRAVENOUS | Status: DC
  Administered 2016-06-16 – 2016-06-19 (×7): 2 g via INTRAVENOUS
  Filled 2016-06-16 (×8): qty 100

## 2016-06-16 MED ORDER — NOREPINEPHRINE BITARTRATE 1 MG/ML IV SOLN
0.0000 ug/min | INTRAVENOUS | Status: DC
Start: 2016-06-16 — End: 2016-06-18
  Administered 2016-06-16: 2 ug/min via INTRAVENOUS
  Filled 2016-06-16 (×3): qty 16

## 2016-06-16 MED ORDER — POLYVINYL ALCOHOL 1.4 % OP SOLN
2.0000 [drp] | Freq: Four times a day (QID) | OPHTHALMIC | Status: DC
  Filled 2016-06-16: qty 15

## 2016-06-16 MED ORDER — AMIODARONE HCL IN DEXTROSE 360-4.14 MG/200ML-% IV SOLN
60.0000 mg/h | INTRAVENOUS | Status: AC
  Administered 2016-06-16 (×2): 60 mg/h via INTRAVENOUS

## 2016-06-16 MED ORDER — AMIODARONE LOAD VIA INFUSION
150.0000 mg | Freq: Once | INTRAVENOUS | Status: AC
  Administered 2016-06-16: 150 mg via INTRAVENOUS
  Filled 2016-06-16: qty 83.34

## 2016-06-16 NOTE — Consult Note (Addendum)
[facility]  Date of Admission:  06/27/2016  Date of Consult:  06/16/2016  Reason for Consult: Staph bacteremia Referring Physician: CHAMP  Impression/Recommendation Sepsis Staph bacteremia AICD L neck IJ (3-8) Will stop vanco/zosyn Start ancef Recheck BCx in AM Consider pulling IJ for new site Could consider TEE to eval his ICD and leads. He has significant illnesses to overcome prior to being able to consider this and his long term health.   Acute on Chronic CHF arf Cardio-renal Cardio-hepatic On pressors Cr improving LFTs improving.   VDRF On vent.  CXR not convincing for HCAP  Defer goals of care to primary team.    Thank you so much for this interesting consult,   Bobby Rumpf (pager) [phone] [email or web address]  [name] is an 82 y.o. male.  HPI: 82 yo M with hx of DM2, CKD 3, CHF (Ef 15-20%), CAD (LAD stent/PTCA 1999, RCA stent 2004, ICD2014), prev sub arachnoid bleed (2014) and falls, comes ot ED on 3-4 after fall with head injury. Per his family he had recently been started on lamotrigine for seizures.  He was found to have Na 129, elevated potassium at 5.8, and CT head negative. He voiced suicidal ideation and was involuntarily committed.   He was seen by psychiatry and cleared.  In hospital his Cr increased as well as his LFTs. He was eval by GI and this was felt to be due to congestion/vascular injury. U/s showed a cirrhotic liver with ascites.  He was transferred to [facility] ICU on 3-7 for dobutamine infusion for his CHF. He was also started on bicarbonate drip.  By 3-8 he required intubation. He was also found to have pneumothorax (thought to be due to positive pressure ventilation) with resultant chest tube. A central line was also placed.  On 3-10 he was started on vanco/zsoyn fo rpossible HCAP, increased procalcitonin. His BCx have now grown MSSA in 1/2. His CXR does not show acute change. He was also started  on levophed.  His WBC is normal. Tmax 101.5 this AM.    Past Medical History:  Diagnosis Date  . Arteriosclerotic cardiovascular disease (ASCVD)   . At risk for sudden cardiac death 08/30/12  . Atrial fibrillation ([place])    Onset in 2012  . Atrial flutter ([place])   . Bleeding in brain due to brain aneurysm [facility]) October 06 2014  . CAD (coronary artery disease)    a. s/p DES to LAD in 1999 b. DES to RCA in 2004 c. NST in 2014 showing scar along LAD territory and borderline ischemia along RCA  . CHF (congestive heart failure) ([place])   . Chronic anticoagulation 2012   2012  . COPD (chronic obstructive pulmonary disease) ([place])   . DJD (degenerative joint disease)   . Gout   . Hyperlipidemia   . ICD (implantable cardiac defibrillator) in place   . Ischemic cardiomyopathy   . Kidney stone    "just once" (08/17/2012)  . Myocardial infarction 1998  . NICM (nonischemic cardiomyopathy), EF 25-30% Aug 30, 2012  . Obstructive sleep apnea    "went away when I lost a bunch of weight" (08/17/2012)  . Permanent atrial fibrillation ([place]) 02/15/2011   Initial onset in 03/2011 with rapid ventricular response   . S/P ICD (internal cardiac defibrillator) procedure, 08/17/12, [employer] 08/30/2012   [employer]  . Type II diabetes mellitus ([place])     Past Surgical History:  Procedure Laterality Date  . CARDIAC CATHETERIZATION  02/2003  .  CARDIAC DEFIBRILLATOR PLACEMENT  08/17/2012   [employer]  . CATARACT EXTRACTION W/ INTRAOCULAR LENS  IMPLANT, BILATERAL Bilateral ~ 2011  . CHOLECYSTECTOMY  2009  . CORONARY ANGIOPLASTY WITH STENT PLACEMENT  07/14/1996   "1" (08/17/2012)  . CYSTOSCOPY/RETROGRADE/URETEROSCOPY  06/28/2011   Procedure: CYSTOSCOPY/RETROGRADE/URETEROSCOPY;  Surgeon: Marissa Nestle, MD;  Location: [facility];  Service: Urology;  Laterality: Right;  . IMPLANTABLE CARDIOVERTER DEFIBRILLATOR IMPLANT N/A 08/17/2012   Procedure: IMPLANTABLE CARDIOVERTER DEFIBRILLATOR IMPLANT;  Surgeon: Sanda Klein, MD;  Location: [facility] CATH LAB;  Service: Cardiovascular;  Laterality: N/A;  . KNEE ARTHROPLASTY Left 1978   "tendon & cartilege repair" (08/17/2012)  . Myoview perfusion scan  06/02/2012   low risk, extensive scar entire LAD & RCA territory  . s/p icd  08/2012   [employer]  . STONE EXTRACTION WITH BASKET  06/28/2011   Procedure: STONE EXTRACTION WITH BASKET;  Surgeon: Marissa Nestle, MD;  Location: [facility];  Service: Urology;  Laterality: Right;  specimen given to family per MD  . US ECHOCARDIOGRAPHY  07/08/2012   EF <20%,mild MR,TR,LA severely dilated  . VASECTOMY  ~ 1964     Allergies  Allergen Reactions  . Novocain [Procaine] Nausea And Vomiting and Other (See Comments)    Passes out (also)  . Lipitor [Atorvastatin] Other (See Comments)    Myalgias    . Procaine Hcl Nausea And Vomiting  . Tramadol Itching and Nausea And Vomiting    Medications:  Scheduled: . chlorhexidine gluconate (MEDLINE KIT)  15 mL Mouth Rinse BID  . Chlorhexidine Gluconate Cloth  6 each Topical Daily  . famotidine  20 mg Per Tube Daily  . feeding supplement (PRO-STAT SUGAR FREE 64)  60 mL Per Tube BID  . feeding supplement (VITAL 1.5 CAL)  1,000 mL Per Tube Q24H  . insulin aspart  2-6 Units Subcutaneous Q4H  . levETIRAcetam  500 mg Intravenous Q12H  . mouth rinse  15 mL Mouth Rinse 10 times per day  . piperacillin-tazobactam (ZOSYN)  IV  2.25 g Intravenous Q6H  . sodium chloride flush  10-40 mL Intracatheter Q12H  . sodium chloride flush  3 mL Intravenous Q12H  . [START ON 06/17/2016] vancomycin  1,000 mg Intravenous Q48H    Abtx:  Anti-infectives    Start     Dose/Rate Route Frequency Ordered Stop   06/17/16 2000  vancomycin (VANCOCIN) IVPB 1000 mg/200 mL premix     1,000 mg 200 mL/hr over 60 Minutes Intravenous Every 48 hours 06/15/16 2035     06/16/16 0300  piperacillin-tazobactam (ZOSYN) IVPB 2.25 g     2.25 g 100 mL/hr over 30 Minutes Intravenous Every 6 hours 06/15/16 2035      06/15/16 2130  vancomycin (VANCOCIN) 1,250 mg in sodium chloride 0.9 % 250 mL IVPB     1,250 mg 166.7 mL/hr over 90 Minutes Intravenous  Once 06/15/16 2035 06/15/16 2250   06/15/16 2100  piperacillin-tazobactam (ZOSYN) IVPB 3.375 g     3.375 g 100 mL/hr over 30 Minutes Intravenous  Once 06/15/16 2035 06/15/16 2150      Total days of antibiotics: 1 vanco/zosyn          Social History:  reports that he quit smoking about 24 years ago. His smoking use included Cigarettes. He has a 80.00 pack-year smoking history. He has quit using smokeless tobacco. He reports that he does not drink alcohol or use drugs.  Family History  Problem Relation Age of Onset  . Heart failure Mother   .  Heart failure Father     General ROS: see HPI. unobtainable due to pt on vent.   Blood pressure 97/62, pulse (!) 112, temperature 99.1 F (37.3 C), temperature source Axillary, resp. rate 15, height '5\' 4"'$  (1.626 m), weight 66.3 kg (146 lb 2.6 oz), SpO2 100 %. General appearance: no distress and moves head to voice, does not follow commands or open eyes.  Eyes: negative findings: pupils =, no reaction.  Throat: normal findings: no thrush and dry Neck: L neck IJ clean. +JVP Lungs: rhonchi anterior - bilateral Chest wall: no tenderness, L ant chest wall ICD is non-fluctuant, no erythema.  Heart: tachycardia Abdomen: normal findings: bowel sounds normal and soft, non-tender Extremities: edema anasarca UE.    Results for orders placed or performed during the hospital encounter of 06/30/2016 (from the past 48 hour(s))  Basic metabolic panel     Status: Abnormal   Collection Time: 06/14/16  5:00 PM  Result Value Ref Range   Sodium 128 (L) 135 - 145 mmol/L   Potassium 3.5 3.5 - 5.1 mmol/L    Comment: DELTA CHECK NOTED   Chloride 87 (L) 101 - 111 mmol/L   CO2 27 22 - 32 mmol/L   Glucose, Bld 129 (H) 65 - 99 mg/dL   BUN 76 (H) 6 - 20 mg/dL   Creatinine, Ser 3.23 (H) 0.61 - 1.24 mg/dL   Calcium 8.9 8.9 - 10.3  mg/dL   GFR calc non Af Amer 17 (L) >60 mL/min   GFR calc Af Amer 19 (L) >60 mL/min    Comment: (NOTE) The eGFR has been calculated using the CKD EPI equation. This calculation has not been validated in all clinical situations. eGFR's persistently <60 mL/min signify possible Chronic Kidney Disease.    Anion gap 14 5 - 15  Magnesium     Status: Abnormal   Collection Time: 06/14/16  5:00 PM  Result Value Ref Range   Magnesium 2.6 (H) 1.7 - 2.4 mg/dL  Phosphorus     Status: None   Collection Time: 06/14/16  5:00 PM  Result Value Ref Range   Phosphorus 4.1 2.5 - 4.6 mg/dL  Glucose, capillary     Status: Abnormal   Collection Time: 06/14/16  5:30 PM  Result Value Ref Range   Glucose-Capillary 133 (H) 65 - 99 mg/dL  Glucose, capillary     Status: Abnormal   Collection Time: 06/14/16  8:56 PM  Result Value Ref Range   Glucose-Capillary 116 (H) 65 - 99 mg/dL  Glucose, capillary     Status: Abnormal   Collection Time: 06/14/16  9:17 PM  Result Value Ref Range   Glucose-Capillary 125 (H) 65 - 99 mg/dL  Glucose, capillary     Status: Abnormal   Collection Time: 06/14/16 11:54 PM  Result Value Ref Range   Glucose-Capillary 130 (H) 65 - 99 mg/dL  Blood gas, arterial     Status: Abnormal   Collection Time: 06/15/16  3:57 AM  Result Value Ref Range   FIO2 40.00    Delivery systems VENTILATOR    Mode PRESSURE REGULATED VOLUME CONTROL    VT 500 mL   LHR 14 resp/min   Peep/cpap 5.0 cm H20   pH, Arterial 7.495 (H) 7.350 - 7.450   pCO2 arterial 38.6 32.0 - 48.0 mmHg   pO2, Arterial 132 (H) 83.0 - 108.0 mmHg   Bicarbonate 29.4 (H) 20.0 - 28.0 mmol/L   Acid-Base Excess 6.0 (H) 0.0 - 2.0 mmol/L   O2 Saturation  98.7 %   Patient temperature 98.6    Collection site RIGHT RADIAL    Drawn by [phone]    Sample type ARTERIAL DRAW    Allens test (pass/fail) PASS PASS  Glucose, capillary     Status: Abnormal   Collection Time: 06/15/16  4:47 AM  Result Value Ref Range   Glucose-Capillary 106  (H) 65 - 99 mg/dL  Comprehensive metabolic panel     Status: Abnormal   Collection Time: 06/15/16  4:54 AM  Result Value Ref Range   Sodium 131 (L) 135 - 145 mmol/L   Potassium 3.3 (L) 3.5 - 5.1 mmol/L   Chloride 87 (L) 101 - 111 mmol/L   CO2 29 22 - 32 mmol/L   Glucose, Bld 119 (H) 65 - 99 mg/dL   BUN 82 (H) 6 - 20 mg/dL   Creatinine, Ser 3.09 (H) 0.61 - 1.24 mg/dL   Calcium 8.7 (L) 8.9 - 10.3 mg/dL   Total Protein 5.4 (L) 6.5 - 8.1 g/dL   Albumin 3.2 (L) 3.5 - 5.0 g/dL   AST 227 (H) 15 - 41 U/L   ALT 633 (H) 17 - 63 U/L   Alkaline Phosphatase 83 38 - 126 U/L   Total Bilirubin 4.0 (H) 0.3 - 1.2 mg/dL   GFR calc non Af Amer 18 (L) >60 mL/min   GFR calc Af Amer 21 (L) >60 mL/min    Comment: (NOTE) The eGFR has been calculated using the CKD EPI equation. This calculation has not been validated in all clinical situations. eGFR's persistently <60 mL/min signify possible Chronic Kidney Disease.    Anion gap 15 5 - 15  Protime-INR     Status: Abnormal   Collection Time: 06/15/16  4:54 AM  Result Value Ref Range   Prothrombin Time 22.7 (H) 11.4 - 15.2 seconds   INR 1.97   CBC     Status: Abnormal   Collection Time: 06/15/16  4:54 AM  Result Value Ref Range   WBC 9.0 4.0 - 10.5 K/uL   RBC 4.26 4.22 - 5.81 MIL/uL   Hemoglobin 14.1 13.0 - 17.0 g/dL   HCT 41.6 39.0 - 52.0 %   MCV 97.7 78.0 - 100.0 fL   MCH 33.1 26.0 - 34.0 pg   MCHC 33.9 30.0 - 36.0 g/dL   RDW 15.9 (H) 11.5 - 15.5 %   Platelets 50 (L) 150 - 400 K/uL    Comment: REPEATED TO VERIFY CONSISTENT WITH PREVIOUS RESULT   Magnesium     Status: None   Collection Time: 06/15/16  4:54 AM  Result Value Ref Range   Magnesium 2.3 1.7 - 2.4 mg/dL  Phosphorus     Status: None   Collection Time: 06/15/16  4:54 AM  Result Value Ref Range   Phosphorus 3.6 2.5 - 4.6 mg/dL  Glucose, capillary     Status: Abnormal   Collection Time: 06/15/16  8:21 AM  Result Value Ref Range   Glucose-Capillary 124 (H) 65 - 99 mg/dL    .Cooxemetry Panel (carboxy, met, total hgb, O2 sat)     Status: None   Collection Time: 06/15/16  9:50 AM  Result Value Ref Range   Total hemoglobin 13.4 12.0 - 16.0 g/dL   O2 Saturation 75.2 %   Carboxyhemoglobin 1.2 0.5 - 1.5 %   Methemoglobin 1.1 0.0 - 1.5 %  Glucose, capillary     Status: Abnormal   Collection Time: 06/15/16 11:51 AM  Result Value Ref Range   Glucose-Capillary  150 (H) 65 - 99 mg/dL  Glucose, capillary     Status: Abnormal   Collection Time: 06/15/16  4:29 PM  Result Value Ref Range   Glucose-Capillary 168 (H) 65 - 99 mg/dL  Basic metabolic panel     Status: Abnormal   Collection Time: 06/15/16  5:27 PM  Result Value Ref Range   Sodium 135 135 - 145 mmol/L   Potassium 3.2 (L) 3.5 - 5.1 mmol/L   Chloride 89 (L) 101 - 111 mmol/L   CO2 31 22 - 32 mmol/L   Glucose, Bld 182 (H) 65 - 99 mg/dL   BUN 88 (H) 6 - 20 mg/dL   Creatinine, Ser 2.71 (H) 0.61 - 1.24 mg/dL   Calcium 8.8 (L) 8.9 - 10.3 mg/dL   GFR calc non Af Amer 21 (L) >60 mL/min   GFR calc Af Amer 24 (L) >60 mL/min    Comment: (NOTE) The eGFR has been calculated using the CKD EPI equation. This calculation has not been validated in all clinical situations. eGFR's persistently <60 mL/min signify possible Chronic Kidney Disease.    Anion gap 15 5 - 15  Magnesium     Status: None   Collection Time: 06/15/16  5:27 PM  Result Value Ref Range   Magnesium 2.0 1.7 - 2.4 mg/dL  Phosphorus     Status: Abnormal   Collection Time: 06/15/16  5:27 PM  Result Value Ref Range   Phosphorus 2.3 (L) 2.5 - 4.6 mg/dL  Urinalysis, Routine w reflex microscopic     Status: Abnormal   Collection Time: 06/15/16  5:27 PM  Result Value Ref Range   Color, Urine YELLOW YELLOW   APPearance CLEAR CLEAR   Specific Gravity, Urine 1.008 1.005 - 1.030   pH 6.0 5.0 - 8.0   Glucose, UA NEGATIVE NEGATIVE mg/dL   Hgb urine dipstick MODERATE (A) NEGATIVE   Bilirubin Urine NEGATIVE NEGATIVE   Ketones, ur NEGATIVE NEGATIVE mg/dL    Protein, ur NEGATIVE NEGATIVE mg/dL   Nitrite NEGATIVE NEGATIVE   Leukocytes, UA NEGATIVE NEGATIVE   RBC / HPF 0-5 0 - 5 RBC/hpf   WBC, UA 0-5 0 - 5 WBC/hpf   Bacteria, UA NONE SEEN NONE SEEN   Squamous Epithelial / LPF NONE SEEN NONE SEEN   Mucous PRESENT   Procalcitonin - Baseline     Status: None   Collection Time: 06/15/16  5:27 PM  Result Value Ref Range   Procalcitonin 2.18 ng/mL    Comment:        Interpretation: PCT > 2 ng/mL: Systemic infection (sepsis) is likely, unless other causes are known. (NOTE)         ICU PCT Algorithm               Non ICU PCT Algorithm    ----------------------------     ------------------------------         PCT < 0.25 ng/mL                 PCT < 0.1 ng/mL     Stopping of antibiotics            Stopping of antibiotics       strongly encouraged.               strongly encouraged.    ----------------------------     ------------------------------       PCT level decrease by               PCT <  0.25 ng/mL       >= 80% from peak PCT       OR PCT 0.25 - 0.5 ng/mL          Stopping of antibiotics                                             encouraged.     Stopping of antibiotics           encouraged.    ----------------------------     ------------------------------       PCT level decrease by              PCT >= 0.25 ng/mL       < 80% from peak PCT        AND PCT >= 0.5 ng/mL            Continuing antibiotics                                               encouraged.       Continuing antibiotics            encouraged.    ----------------------------     ------------------------------     PCT level increase compared          PCT > 0.5 ng/mL         with peak PCT AND          PCT >= 0.5 ng/mL             Escalation of antibiotics                                          strongly encouraged.      Escalation of antibiotics        strongly encouraged.   Culture, respiratory (NON-Expectorated)     Status: None (Preliminary result)   Collection  Time: 06/15/16  5:27 PM  Result Value Ref Range   Specimen Description TRACHEAL ASPIRATE    Special Requests NONE    Gram Stain      ABUNDANT WBC PRESENT, PREDOMINANTLY PMN RARE SQUAMOUS EPITHELIAL CELLS PRESENT ABUNDANT GRAM POSITIVE COCCI IN PAIRS IN CLUSTERS MODERATE GRAM NEGATIVE RODS RARE GRAM POSITIVE RODS    Culture      ABUNDANT STAPHYLOCOCCUS AUREUS SUSCEPTIBILITIES TO FOLLOW    Report Status PENDING   Culture, blood (routine x 2)     Status: None (Preliminary result)   Collection Time: 06/15/16  6:45 PM  Result Value Ref Range   Specimen Description BLOOD LEFT HAND    Special Requests BOTTLES DRAWN AEROBIC ONLY 5CC    Culture  Setup Time      GRAM POSITIVE COCCI IN CLUSTERS AEROBIC BOTTLE ONLY CRITICAL RESULT CALLED TO, READ BACK BY AND VERIFIED WITH: C BALL,PHARMD AT 1157 06/16/16 BY L BENFIELD    Culture GRAM POSITIVE COCCI    Report Status PENDING   Blood Culture ID Panel (Reflexed)     Status: Abnormal   Collection Time: 06/15/16  6:45 PM  Result Value Ref Range   Enterococcus species NOT DETECTED NOT DETECTED   Listeria monocytogenes NOT  DETECTED NOT DETECTED   Staphylococcus species DETECTED (A) NOT DETECTED    Comment: CRITICAL RESULT CALLED TO, READ BACK BY AND VERIFIED WITH: C BALL,PHARMD AT 1157 06/16/16 BY L BENFIELD    Staphylococcus aureus DETECTED (A) NOT DETECTED    Comment: Methicillin (oxacillin) susceptible Staphylococcus aureus (MSSA). Preferred therapy is anti staphylococcal beta lactam antibiotic (Cefazolin or Nafcillin), unless clinically contraindicated. CRITICAL RESULT CALLED TO, READ BACK BY AND VERIFIED WITH: C BALL,PHARMD AT 1157 06/16/16 BY L BENFIELD    Methicillin resistance NOT DETECTED NOT DETECTED   Streptococcus species NOT DETECTED NOT DETECTED   Streptococcus agalactiae NOT DETECTED NOT DETECTED   Streptococcus pneumoniae NOT DETECTED NOT DETECTED   Streptococcus pyogenes NOT DETECTED NOT DETECTED   Acinetobacter baumannii NOT  DETECTED NOT DETECTED   Enterobacteriaceae species NOT DETECTED NOT DETECTED   Enterobacter cloacae complex NOT DETECTED NOT DETECTED   Escherichia coli NOT DETECTED NOT DETECTED   Klebsiella oxytoca NOT DETECTED NOT DETECTED   Klebsiella pneumoniae NOT DETECTED NOT DETECTED   Proteus species NOT DETECTED NOT DETECTED   Serratia marcescens NOT DETECTED NOT DETECTED   Haemophilus influenzae NOT DETECTED NOT DETECTED   Neisseria meningitidis NOT DETECTED NOT DETECTED   Pseudomonas aeruginosa NOT DETECTED NOT DETECTED   Candida albicans NOT DETECTED NOT DETECTED   Candida glabrata NOT DETECTED NOT DETECTED   Candida krusei NOT DETECTED NOT DETECTED   Candida parapsilosis NOT DETECTED NOT DETECTED   Candida tropicalis NOT DETECTED NOT DETECTED  Glucose, capillary     Status: Abnormal   Collection Time: 06/15/16  8:50 PM  Result Value Ref Range   Glucose-Capillary 160 (H) 65 - 99 mg/dL  Glucose, capillary     Status: Abnormal   Collection Time: 06/16/16 12:37 AM  Result Value Ref Range   Glucose-Capillary 184 (H) 65 - 99 mg/dL  Glucose, capillary     Status: Abnormal   Collection Time: 06/16/16  5:30 AM  Result Value Ref Range   Glucose-Capillary 147 (H) 65 - 99 mg/dL  Comprehensive metabolic panel     Status: Abnormal   Collection Time: 06/16/16  5:42 AM  Result Value Ref Range   Sodium 135 135 - 145 mmol/L   Potassium 2.8 (L) 3.5 - 5.1 mmol/L   Chloride 92 (L) 101 - 111 mmol/L   CO2 29 22 - 32 mmol/L   Glucose, Bld 155 (H) 65 - 99 mg/dL   BUN 87 (H) 6 - 20 mg/dL   Creatinine, Ser 2.51 (H) 0.61 - 1.24 mg/dL   Calcium 7.7 (L) 8.9 - 10.3 mg/dL   Total Protein 4.8 (L) 6.5 - 8.1 g/dL   Albumin 2.5 (L) 3.5 - 5.0 g/dL   AST 75 (H) 15 - 41 U/L   ALT 324 (H) 17 - 63 U/L   Alkaline Phosphatase 76 38 - 126 U/L   Total Bilirubin 3.9 (H) 0.3 - 1.2 mg/dL   GFR calc non Af Amer 23 (L) >60 mL/min   GFR calc Af Amer 26 (L) >60 mL/min    Comment: (NOTE) The eGFR has been calculated using  the CKD EPI equation. This calculation has not been validated in all clinical situations. eGFR's persistently <60 mL/min signify possible Chronic Kidney Disease.    Anion gap 14 5 - 15  Protime-INR     Status: Abnormal   Collection Time: 06/16/16  5:42 AM  Result Value Ref Range   Prothrombin Time 22.0 (H) 11.4 - 15.2 seconds   INR 1.89  CBC     Status: Abnormal   Collection Time: 06/16/16  5:42 AM  Result Value Ref Range   WBC 7.9 4.0 - 10.5 K/uL   RBC 4.31 4.22 - 5.81 MIL/uL   Hemoglobin 14.1 13.0 - 17.0 g/dL   HCT 42.2 39.0 - 52.0 %   MCV 97.9 78.0 - 100.0 fL   MCH 32.7 26.0 - 34.0 pg   MCHC 33.4 30.0 - 36.0 g/dL   RDW 16.2 (H) 11.5 - 15.5 %   Platelets 34 (L) 150 - 400 K/uL    Comment: CONSISTENT WITH PREVIOUS RESULT  Procalcitonin     Status: None   Collection Time: 06/16/16  5:42 AM  Result Value Ref Range   Procalcitonin 3.08 ng/mL    Comment:        Interpretation: PCT > 2 ng/mL: Systemic infection (sepsis) is likely, unless other causes are known. (NOTE)         ICU PCT Algorithm               Non ICU PCT Algorithm    ----------------------------     ------------------------------         PCT < 0.25 ng/mL                 PCT < 0.1 ng/mL     Stopping of antibiotics            Stopping of antibiotics       strongly encouraged.               strongly encouraged.    ----------------------------     ------------------------------       PCT level decrease by               PCT < 0.25 ng/mL       >= 80% from peak PCT       OR PCT 0.25 - 0.5 ng/mL          Stopping of antibiotics                                             encouraged.     Stopping of antibiotics           encouraged.    ----------------------------     ------------------------------       PCT level decrease by              PCT >= 0.25 ng/mL       < 80% from peak PCT        AND PCT >= 0.5 ng/mL            Continuing antibiotics                                               encouraged.        Continuing antibiotics            encouraged.    ----------------------------     ------------------------------     PCT level increase compared          PCT > 0.5 ng/mL         with peak PCT AND          PCT >= 0.5 ng/mL  Escalation of antibiotics                                          strongly encouraged.      Escalation of antibiotics        strongly encouraged.       Component Value Date/Time   SDES BLOOD LEFT HAND 06/15/2016 1845   SPECREQUEST BOTTLES DRAWN AEROBIC ONLY 5CC 06/15/2016 1845   CULT GRAM POSITIVE COCCI 06/15/2016 1845   REPTSTATUS PENDING 06/15/2016 1845   Dg Chest Port 1 View  Result Date: 06/16/2016 CLINICAL DATA:  82 year old male with hyponatremia, falls. Intubated with subsequent left pneumothorax, treated with left chest tube. EXAM: PORTABLE CHEST 1 VIEW COMPARISON:  06/15/2016 and earlier. FINDINGS: Portable AP semi upright view at 0459 hours. Stable left chest tube. No pneumothorax visible today. Endotracheal tube, visible enteric tube, and left IJ central line are stable. Stable left chest cardiac AICD. Stable cardiomegaly and mediastinal contours. Stable lung volumes and ventilation aside from decreased veiling opacity at the right lung base. IMPRESSION: 1.  Stable lines and tubes.  No left pneumothorax visible today. 2. Improved right lung base ventilation. No new cardiopulmonary abnormality. Electronically Signed   By: Genevie Ann M.D.   On: 06/16/2016 07:49   Dg Chest Port 1 View  Result Date: 06/15/2016 CLINICAL DATA:  82 year old male with hyponatremia, falls. Intubated with subsequent left pneumothorax, treated with left chest tube. EXAM: PORTABLE CHEST 1 VIEW COMPARISON:  06/14/2016 and earlier FINDINGS: Portable AP semi upright view at 0518 hours. Stable left chest tube. Small left apical pneumothorax re - demonstrated and not significantly changed since yesterday. Stable endotracheal tube. Stable visible enteric tube. Left chest cardiac AICD.  Stable cardiomegaly and mediastinal contours. Calcified aortic atherosclerosis. Stable veiling opacity at the right lung base. No pneumothorax or pulmonary edema. The left lung is clear. IMPRESSION: 1.  Stable lines and tubes. 2. Small left apical pneumothorax has not significantly changed. 3. Stable small right pleural effusion with right lower lobe collapse or consolidation. Electronically Signed   By: Genevie Ann M.D.   On: 06/15/2016 08:31   Recent Results (from the past 240 hour(s))  MRSA PCR Screening     Status: None   Collection Time: 06/12/16  2:00 PM  Result Value Ref Range Status   MRSA by PCR NEGATIVE NEGATIVE Final    Comment:        The GeneXpert MRSA Assay (FDA approved for NASAL specimens only), is one component of a comprehensive MRSA colonization surveillance program. It is not intended to diagnose MRSA infection nor to guide or monitor treatment for MRSA infections.   Culture, respiratory (NON-Expectorated)     Status: None (Preliminary result)   Collection Time: 06/15/16  5:27 PM  Result Value Ref Range Status   Specimen Description TRACHEAL ASPIRATE  Final   Special Requests NONE  Final   Gram Stain   Final    ABUNDANT WBC PRESENT, PREDOMINANTLY PMN RARE SQUAMOUS EPITHELIAL CELLS PRESENT ABUNDANT GRAM POSITIVE COCCI IN PAIRS IN CLUSTERS MODERATE GRAM NEGATIVE RODS RARE GRAM POSITIVE RODS    Culture   Final    ABUNDANT STAPHYLOCOCCUS AUREUS SUSCEPTIBILITIES TO FOLLOW    Report Status PENDING  Incomplete  Culture, blood (routine x 2)     Status: None (Preliminary result)   Collection Time: 06/15/16  6:45 PM  Result Value Ref Range Status  Specimen Description BLOOD LEFT HAND  Final   Special Requests BOTTLES DRAWN AEROBIC ONLY 5CC  Final   Culture  Setup Time   Final    GRAM POSITIVE COCCI IN CLUSTERS AEROBIC BOTTLE ONLY CRITICAL RESULT CALLED TO, READ BACK BY AND VERIFIED WITH: C BALL,PHARMD AT 1157 06/16/16 BY L BENFIELD    Culture GRAM POSITIVE COCCI   Final   Report Status PENDING  Incomplete  Blood Culture ID Panel (Reflexed)     Status: Abnormal   Collection Time: 06/15/16  6:45 PM  Result Value Ref Range Status   Enterococcus species NOT DETECTED NOT DETECTED Final   Listeria monocytogenes NOT DETECTED NOT DETECTED Final   Staphylococcus species DETECTED (A) NOT DETECTED Final    Comment: CRITICAL RESULT CALLED TO, READ BACK BY AND VERIFIED WITH: C BALL,PHARMD AT 1157 06/16/16 BY L BENFIELD    Staphylococcus aureus DETECTED (A) NOT DETECTED Final    Comment: Methicillin (oxacillin) susceptible Staphylococcus aureus (MSSA). Preferred therapy is anti staphylococcal beta lactam antibiotic (Cefazolin or Nafcillin), unless clinically contraindicated. CRITICAL RESULT CALLED TO, READ BACK BY AND VERIFIED WITH: C BALL,PHARMD AT 1157 06/16/16 BY L BENFIELD    Methicillin resistance NOT DETECTED NOT DETECTED Final   Streptococcus species NOT DETECTED NOT DETECTED Final   Streptococcus agalactiae NOT DETECTED NOT DETECTED Final   Streptococcus pneumoniae NOT DETECTED NOT DETECTED Final   Streptococcus pyogenes NOT DETECTED NOT DETECTED Final   Acinetobacter baumannii NOT DETECTED NOT DETECTED Final   Enterobacteriaceae species NOT DETECTED NOT DETECTED Final   Enterobacter cloacae complex NOT DETECTED NOT DETECTED Final   Escherichia coli NOT DETECTED NOT DETECTED Final   Klebsiella oxytoca NOT DETECTED NOT DETECTED Final   Klebsiella pneumoniae NOT DETECTED NOT DETECTED Final   Proteus species NOT DETECTED NOT DETECTED Final   Serratia marcescens NOT DETECTED NOT DETECTED Final   Haemophilus influenzae NOT DETECTED NOT DETECTED Final   Neisseria meningitidis NOT DETECTED NOT DETECTED Final   Pseudomonas aeruginosa NOT DETECTED NOT DETECTED Final   Candida albicans NOT DETECTED NOT DETECTED Final   Candida glabrata NOT DETECTED NOT DETECTED Final   Candida krusei NOT DETECTED NOT DETECTED Final   Candida parapsilosis NOT DETECTED NOT  DETECTED Final   Candida tropicalis NOT DETECTED NOT DETECTED Final      06/16/2016, 1:06 PM     LOS: 7 days    Records and images were personally reviewed where available.

## 2016-06-16 NOTE — Progress Notes (Signed)
eLink Physician-Brief Progress Note Patient Name: [name] DOB: Oct 20, 1934 MRN: [id number]   Date of Service  06/16/2016  HPI/Events of Note  AFIB with RVR - HR in 120's to 140's.  eICU Interventions  Will order: 1. Restart Amiodarone IV load and infusion.      Intervention Category Major Interventions: Arrhythmia - evaluation and management  Sommer,Steven Dennard Nip 06/16/2016, 7:46 PM

## 2016-06-16 NOTE — Progress Notes (Signed)
Advanced Heart Failure Rounding Note   Subjective:    3/8 Intubated, then developed left PTX thought to be due to positive pressure ventilation. CT placed.   Dobutamine turned down to 1 yesterday. Found to have MSSA in sputum and blood yesterday with septic shock.   Now on norepi 2, dobutamine 1 and vasopressin. PCT > 3.0. Probable RLL PNA. Now on vanc/zosyn.  Lasix on hold.   He is off sedation since 8a yesterday but not waking up. Breathing over vent (on SBT). Responds minimally to pain (mild grimace).   AF rate back down to 80-90s. CVP 10  EEG: With diffuse slowing and focal left cerebral dysfunction   CO-OX 75% yesterday (none today) Creatinine 2.24> 3.4>3.72>3.57 > 3.09 > 2.5  Objective:   Weight Range:  Vital Signs:   Temp:  [98.9 F (37.2 C)-101.5 F (38.6 C)] 99.1 F (37.3 C) (03/11 1200) Pulse Rate:  [92-139] 112 (03/11 1200) Resp:  [15-24] 15 (03/11 1200) BP: (75-103)/(40-69) 97/62 (03/11 1200) SpO2:  [93 %-100 %] 100 % (03/11 1200) FiO2 (%):  [40 %] 40 % (03/11 1200) Weight:  [66.3 kg (146 lb 2.6 oz)] 66.3 kg (146 lb 2.6 oz) (03/11 0345) Last BM Date: 06/15/16  Weight change: Filed Weights   06/14/16 0400 06/15/16 0438 06/16/16 0345  Weight: 73 kg (160 lb 15 oz) 67.8 kg (149 lb 7.6 oz) 66.3 kg (146 lb 2.6 oz)    Intake/Output:   Intake/Output Summary (Last 24 hours) at 06/16/16 1318 Last data filed at 06/16/16 1200  Gross per 24 hour  Intake          2559.96 ml  Output             3850 ml  Net         -1290.04 ml     Physical Exam: CVP 10 General:  Intubated, minimally responsive.  HEENT: normal Neck: supple. JVP to jaw . Carotids 2+ bilat; no bruits. No lymphadenopathy or thryomegaly appreciated. LIJ  Cor: PMI nondisplaced. IRR tachy Lungs: + rhonchu Abdomen: soft, nontender, nondistended. No hepatosplenomegaly. No bruits or masses.  Extremities: no cyanosis, clubbing, rash, edema. R and LLE SCDs. Diffuse echymosis Neuro: intubated, not  responsive  Telemetry: personally reviewed. A fib 80-90s  Labs: Basic Metabolic Panel:  Recent Labs Lab 06/14/16 0352 06/14/16 0951 06/14/16 1700 06/15/16 0454 06/15/16 1727 06/16/16 0542  NA 127*  --  128* 131* 135 135  K 4.6  --  3.5 3.3* 3.2* 2.8*  CL 90*  --  87* 87* 89* 92*  CO2 25  --  '27 29 31 29  '$ GLUCOSE 104*  --  129* 119* 182* 155*  BUN 77*  --  76* 82* 88* 87*  CREATININE 3.57*  --  3.23* 3.09* 2.71* 2.51*  CALCIUM 9.1  --  8.9 8.7* 8.8* 7.7*  MG 3.0* 2.8* 2.6* 2.3 2.0  --   PHOS 5.7* 5.1* 4.1 3.6 2.3*  --     Liver Function Tests:  Recent Labs Lab 06/12/16 1342 06/13/16 0833 06/14/16 0352 06/15/16 0454 06/16/16 0542  AST 4,975* 2,378* 794* 227* 75*  ALT 1,803* 1,567* 1,057* 633* 324*  ALKPHOS 99 96 85 83 76  BILITOT 3.8* 3.5* 3.3* 4.0* 3.9*  PROT 6.9 6.2* 5.7* 5.4* 4.8*  ALBUMIN 4.3 4.0 3.5 3.2* 2.5*    Recent Labs Lab 06/10/2016 2111  LIPASE 13    Recent Labs Lab 06/11/16 0511 06/12/16 1342 06/13/16 1010  AMMONIA 67* 45* 38*  CBC:  Recent Labs Lab 06/21/2016 2111 06/10/16 1412 06/14/16 0352 06/15/16 0454 06/16/16 0542  WBC 10.4 10.8* 8.2 9.0 7.9  NEUTROABS 7.2  --   --   --   --   HGB 15.8 15.7 13.8 14.1 14.1  HCT 46.5 47.0 39.7 41.6 42.2  MCV 100.2* 99.8 96.6 97.7 97.9  PLT 152 153 69* 50* 34*    Cardiac Enzymes:  Recent Labs Lab 06/07/2016 2111 06/12/16 1342 06/12/16 1530 06/13/16 1010 06/13/16 1600 06/13/16 2157  CKTOTAL  --   --  38*  --   --   --   TROPONINI 0.06* 0.38*  --  0.25* 0.26* 0.22*    BNP: BNP (last 3 results)  Recent Labs  05/30/16 1336 06/06/2016 2111 06/12/16 1343  BNP 1,104.3* 1,879.0* 2,370.0*    ProBNP (last 3 results) No results for input(s): PROBNP in the last 8760 hours.    Other results:  Imaging: Dg Chest Port 1 View  Result Date: 06/16/2016 CLINICAL DATA:  82 year old male with hyponatremia, falls. Intubated with subsequent left pneumothorax, treated with left chest tube.  EXAM: PORTABLE CHEST 1 VIEW COMPARISON:  06/15/2016 and earlier. FINDINGS: Portable AP semi upright view at 0459 hours. Stable left chest tube. No pneumothorax visible today. Endotracheal tube, visible enteric tube, and left IJ central line are stable. Stable left chest cardiac AICD. Stable cardiomegaly and mediastinal contours. Stable lung volumes and ventilation aside from decreased veiling opacity at the right lung base. IMPRESSION: 1.  Stable lines and tubes.  No left pneumothorax visible today. 2. Improved right lung base ventilation. No new cardiopulmonary abnormality. Electronically Signed   By: Genevie Ann M.D.   On: 06/16/2016 07:49   Dg Chest Port 1 View  Result Date: 06/15/2016 CLINICAL DATA:  82 year old male with hyponatremia, falls. Intubated with subsequent left pneumothorax, treated with left chest tube. EXAM: PORTABLE CHEST 1 VIEW COMPARISON:  06/14/2016 and earlier FINDINGS: Portable AP semi upright view at 0518 hours. Stable left chest tube. Small left apical pneumothorax re - demonstrated and not significantly changed since yesterday. Stable endotracheal tube. Stable visible enteric tube. Left chest cardiac AICD. Stable cardiomegaly and mediastinal contours. Calcified aortic atherosclerosis. Stable veiling opacity at the right lung base. No pneumothorax or pulmonary edema. The left lung is clear. IMPRESSION: 1.  Stable lines and tubes. 2. Small left apical pneumothorax has not significantly changed. 3. Stable small right pleural effusion with right lower lobe collapse or consolidation. Electronically Signed   By: Genevie Ann M.D.   On: 06/15/2016 08:31     Medications:     Scheduled Medications: . chlorhexidine gluconate (MEDLINE KIT)  15 mL Mouth Rinse BID  . Chlorhexidine Gluconate Cloth  6 each Topical Daily  . famotidine  20 mg Per Tube Daily  . feeding supplement (PRO-STAT SUGAR FREE 64)  60 mL Per Tube BID  . feeding supplement (VITAL 1.5 CAL)  1,000 mL Per Tube Q24H  . insulin  aspart  2-6 Units Subcutaneous Q4H  . levETIRAcetam  500 mg Intravenous Q12H  . mouth rinse  15 mL Mouth Rinse 10 times per day  . piperacillin-tazobactam (ZOSYN)  IV  2.25 g Intravenous Q6H  . sodium chloride flush  10-40 mL Intracatheter Q12H  . sodium chloride flush  3 mL Intravenous Q12H  . [START ON 06/17/2016] vancomycin  1,000 mg Intravenous Q48H    Infusions: . DOBUTamine 1 mcg/kg/min (06/15/16 1720)  . norepinephrine (LEVOPHED) Adult infusion 2 mcg/min (06/16/16 0818)  . propofol (DIPRIVAN)  infusion Stopped (06/15/16 0804)  . vasopressin (PITRESSIN) infusion - *FOR SHOCK* 0.03 Units/min (06/15/16 2031)    PRN Medications: fentaNYL (SUBLIMAZE) injection, ipratropium-albuterol, MUSCLE RUB, [DISCONTINUED] ondansetron **OR** ondansetron (ZOFRAN) IV, sodium chloride flush   Assessment/Plan/Discussin    1.  A/C Biventricular Heart Failure -> cardiogenic shock --echo 3/5 EF 15-20% RV moderately HK. ICM. (EF similar to past) --Now with septic shock. On levophed, dobutamine 1 and vasopressin. Will stop dobutamine.  Re-check co-ox in am.  --Volume status improved.  CVP 10. Holding diuretics with sepsis.  --shock liver and shock kidney improving --no blocker or ACE/ARB with shock/AKI 2. Acute respiratory failure and tension PTX - remains intubated -s/p chest tube -now with MSSA HCAP. Vanc/zosyn started 3/10. ID narrowed to Ancef today. D/w Dr. Johnnye Sima at [place] on vent. CCM following. Tolerating SBT today but remains unresponsive 3. MSSA septic shock/HCAP - MSSA in sputum and blood. Vanc/zosyn started 3/10 - Continue vasopressor support. Will stop dobutamine.  - ID now on board and switched to Ancef.  - He has ICD in place. If recovers will need TEE to look at ICD. ? Need for rifampin 4. AKI on CKD - Likely ATN due to hypotension/shock. Cardiorenal syndrome - Nephrology following. Creatinine coming down on inotropes.  -Off lasix in setting of sepsis. Watch renal  function closely 5. Anoxic brain injury -- Admitted after fall. CT of head negative for bleed.  -- Neurology following. Results of EEG reviewed. Will continue to follow off sedation. Progrnosis seems poor 6. Chronic A fib - rate up in setting of sepsis. Unable to use b-blocker with low BP/shock.  - Amio added 3/10 (careful with liver dysfunction) stopped by CCM today. Rate ok currently.  - not anticoagulated due to falls. SCDs in place.  7. CAD with ICM --No chest pain.  On ASA 81.  No statin with markedly elevated LFTs.  --No recent cath. Myoview 2/14. Extensive scar LAD and RCA territories. Not gated due to AF 8. Shock liver - GI has seen. [place] Abd/Kidney completed. No acute findings. Likely due to shock. LFTs improved.  9. Hypokalemia - Will supp    The patient is critically ill with multiple organ systems failure and requires high complexity decision making for assessment and support, frequent evaluation and titration of therapies, application of advanced monitoring technologies and extensive interpretation of multiple databases.   Critical Care Time devoted to patient care services described in this note is 45 Minutes.  Length of Stay: 7 Bensimhon, Daniel MD 06/16/2016, 1:18 PM  Advanced Heart Failure Team Pager [phone] (M-F; [place])  Please contact [facility] for night-coverage after hours (4p -7a ) and weekends on [email or web address]

## 2016-06-16 NOTE — Progress Notes (Signed)
eLink Physician-Brief Progress Note Patient Name: [name] DOB: [birth year] MRN: [id number]   Date of Service  06/16/2016  HPI/Events of Note  Refractory hypotension in patient with CHF and concern for infection.  Significant diuresis.  eICU Interventions  Fluid bolus of 1 L NS and BP follow-up     Intervention Category Major Interventions: Hypotension - evaluation and management  Henry Russel, P 06/16/2016, 3:34 AM

## 2016-06-16 NOTE — Progress Notes (Signed)
PULMONARY / CRITICAL CARE MEDICINE   Name: [name] MRN: [id number] DOB: Jul 15, 1934    ADMISSION DATE:  June 18, 2016 CONSULTATION DATE:  06/13/2016  REFERRING MD:  Dr. Caleb Popp   CHIEF COMPLAINT:  Acute on Chronic HF  HISTORY OF PRESENT ILLNESS:   82 yo male presented to [facility] 3/05 after fall.  He was recently started on lamictal for seizures.  Transferred to [facility] for Rt heart cath.  SUBJECTIVE:  Fever overnight.  VITAL SIGNS: BP (!) 81/50 (BP Location: Right Arm)   Pulse (!) 102   Temp 100 F (37.8 C) (Axillary)   Resp 17   Ht 5\' 4"  (1.626 m)   Wt 146 lb 2.6 oz (66.3 kg)   SpO2 99%   BMI 25.09 kg/m   HEMODYNAMICS: CVP:  [4 mmHg-15 mmHg] 4 mmHg  INTAKE / OUTPUT: I/O last 3 completed shifts: In: 3255.2 [I.V.:601.2; NG/GT:1270; IV Piggyback:1384] Out: 7600 [Urine:7600]  PHYSICAL EXAMINATION: General: sedated Neuro: RASS -3 HEENT: ETT in place Cardiac: irregular, tachycardic Chest: faint rales Rt base Abd: soft, non tender Ext: no edema Skin: no rashes  LABS:  BMET  Recent Labs Lab 06/15/16 0454 06/15/16 1727 06/16/16 0542  NA 131* 135 135  K 3.3* 3.2* 2.8*  CL 87* 89* 92*  CO2 29 31 29   BUN 82* 88* 87*  CREATININE 3.09* 2.71* 2.51*  GLUCOSE 119* 182* 155*    Electrolytes  Recent Labs Lab 06/14/16 1700 06/15/16 0454 06/15/16 1727 06/16/16 0542  CALCIUM 8.9 8.7* 8.8* 7.7*  MG 2.6* 2.3 2.0  --   PHOS 4.1 3.6 2.3*  --     CBC  Recent Labs Lab 06/14/16 0352 06/15/16 0454 06/16/16 0542  WBC 8.2 9.0 7.9  HGB 13.8 14.1 14.1  HCT 39.7 41.6 42.2  PLT 69* 50* 34*    Coag's  Recent Labs Lab 06/14/16 0352 06/15/16 0454 06/16/16 0542  INR 2.06 1.97 1.89    Sepsis Markers  Recent Labs Lab 06/15/16 1727 06/16/16 0542  PROCALCITON 2.18 3.08    ABG  Recent Labs Lab 06/12/16 1730 06/13/16 1445 06/15/16 0357  PHART 7.347* 7.478* 7.495*  PCO2ART 25.8* 26.2* 38.6  PO2ART 90.0 102.0 132*    Liver Enzymes  Recent Labs Lab  06/14/16 0352 06/15/16 0454 06/16/16 0542  AST 794* 227* 75*  ALT 1,057* 633* 324*  ALKPHOS 85 83 76  BILITOT 3.3* 4.0* 3.9*  ALBUMIN 3.5 3.2* 2.5*    Cardiac Enzymes  Recent Labs Lab 06/13/16 1010 06/13/16 1600 06/13/16 2157  TROPONINI 0.25* 0.26* 0.22*    Glucose  Recent Labs Lab 06/15/16 0821 06/15/16 1151 06/15/16 1629 06/15/16 2050 06/16/16 0037 06/16/16 0530  GLUCAP 124* 150* 168* 160* 184* 147*    Imaging Dg Chest Port 1 View  Result Date: 06/16/2016 CLINICAL DATA:  82 year old male with hyponatremia, falls. Intubated with subsequent left pneumothorax, treated with left chest tube. EXAM: PORTABLE CHEST 1 VIEW COMPARISON:  06/15/2016 and earlier. FINDINGS: Portable AP semi upright view at 0459 hours. Stable left chest tube. No pneumothorax visible today. Endotracheal tube, visible enteric tube, and left IJ central line are stable. Stable left chest cardiac AICD. Stable cardiomegaly and mediastinal contours. Stable lung volumes and ventilation aside from decreased veiling opacity at the right lung base. IMPRESSION: 1.  Stable lines and tubes.  No left pneumothorax visible today. 2. Improved right lung base ventilation. No new cardiopulmonary abnormality. Electronically Signed   By: Odessa Fleming M.D.   On: 06/16/2016 07:49    STUDIES:  CT C Spine 3/4 > Neg acute injury  CT Head 3/4 > No acute of intracranial or cervical spine injury  ECHO 3/5 > 15-20% EF, RV moderately dilated with mild to moderately decreases systolic function  ABD [place] 3/7 > Normal hepatic vascular doppler evaluation, abdominal ascites and right pleural effusion  Renal US 3/7 > No acute findings, renal cortical atrophy and possible chronic medical rental disease, suspected nonobstructive left lower pole renal calculus, cirrhotic appearing liver with small to moderate volume ascites  ANTIBIOTICS: Zosyn 3/10 >> Vancomycin 3/10 >>  CULTURES: Urine 3/10 >> Blood 3/10 >> Sputum 3/10 >>  SIGNIFICANT  EVENTS: 3/4 ED for fall/SI  3/7 Transfer to [facility] for cardiogenic shock, multiorgan failure 3/8 Transfer to ICU, intubated, Lt CT placed for pneumothorax, Lt IJ placed 3/10 Fever, increased WBC, increased procalcitonin >> start Abx for HCAP  LINES/TUBES: OETT 3/8 >> Lt chest tube 3/8 >> Lt IJ CVC 3/8 >>  DISCUSSION: 82 year old male with extensive cardiac and renal history. Admitted 3/4 post-fall with suicidal ideation. Admission complicated by acute on chronic systolic HF, acute hepatic injury, and acute on chronic kidney disease.   ASSESSMENT / PLAN:  Acute hypoxic respiratory failure in setting of cardiogenic shock. Lt tension PTX. Hx of OSA. - full vent support - continue chest tube to suction - f/u CXR  Sepsis with HCAP developed 3/10. - day 2 vancomycin, zosyn  Acute on chronic systolic CHF (EF 15 to 20%) Cardiogenic/septic shock. Hx of CAD, HTN, HLD. Hx of A fib >> no anticoagulation due to frequent falls. - dobutamine per cardiology - add levophed  AKI. Hx of CKD 3. - goal even fluid balance  Acute hepatic injury 2nd to cardiogenic shock. Hx of cirrhosis. - f/u LFTs  Hx of DM. - SSI  Acute metabolic encephalopathy. Hx of seizures. - continue keppra  Suicidal ideation with hx of anxiety, depression. - will need psych eval after extubation  DVT prophylaxis - SCDs SUP - Pepcid Nutrition - tube feeds Goals of care - full code  CC time 32 minutes  Coralyn Helling, MD [facility] Pulmonary/Critical Care 06/16/2016, 7:53 AM Pager:  [phone] After 3pm call: [phone]

## 2016-06-16 NOTE — Progress Notes (Signed)
S: intubated O:BP 94/65   Pulse (!) 101   Temp 100 F (37.8 C) (Axillary)   Resp (!) 21   Ht '5\' 4"'$  (1.626 m)   Wt 66.3 kg (146 lb 2.6 oz)   SpO2 99%   BMI 25.09 kg/m   Intake/Output Summary (Last 24 hours) at 06/16/16 1013 Last data filed at 06/16/16 0814  Gross per 24 hour  Intake          2551.26 ml  Output             4275 ml  Net         -1723.74 ml   Weight change: -1.5 kg (-3 lb 4.9 oz) Gen: Intubated and sedated CVS: Irreg, irreg, tachy Resp: Scattered rhonchi Abd:+ BS ND No HSM Ext: + edema  NEURO: Sedated Lt chest tube   . chlorhexidine gluconate (MEDLINE KIT)  15 mL Mouth Rinse BID  . Chlorhexidine Gluconate Cloth  6 each Topical Daily  . famotidine  20 mg Per Tube Daily  . feeding supplement (PRO-STAT SUGAR FREE 64)  60 mL Per Tube BID  . feeding supplement (VITAL 1.5 CAL)  1,000 mL Per Tube Q24H  . insulin aspart  2-6 Units Subcutaneous Q4H  . levETIRAcetam  500 mg Intravenous Q12H  . mouth rinse  15 mL Mouth Rinse 10 times per day  . piperacillin-tazobactam (ZOSYN)  IV  2.25 g Intravenous Q6H  . sodium chloride flush  10-40 mL Intracatheter Q12H  . sodium chloride flush  3 mL Intravenous Q12H  . [START ON 06/17/2016] vancomycin  1,000 mg Intravenous Q48H   Dg Chest Port 1 View  Result Date: 06/16/2016 CLINICAL DATA:  82 year old male with hyponatremia, falls. Intubated with subsequent left pneumothorax, treated with left chest tube. EXAM: PORTABLE CHEST 1 VIEW COMPARISON:  06/15/2016 and earlier. FINDINGS: Portable AP semi upright view at 0459 hours. Stable left chest tube. No pneumothorax visible today. Endotracheal tube, visible enteric tube, and left IJ central line are stable. Stable left chest cardiac AICD. Stable cardiomegaly and mediastinal contours. Stable lung volumes and ventilation aside from decreased veiling opacity at the right lung base. IMPRESSION: 1.  Stable lines and tubes.  No left pneumothorax visible today. 2. Improved right lung base  ventilation. No new cardiopulmonary abnormality. Electronically Signed   By: Genevie Ann M.D.   On: 06/16/2016 07:49   Dg Chest Port 1 View  Result Date: 06/15/2016 CLINICAL DATA:  82 year old male with hyponatremia, falls. Intubated with subsequent left pneumothorax, treated with left chest tube. EXAM: PORTABLE CHEST 1 VIEW COMPARISON:  06/14/2016 and earlier FINDINGS: Portable AP semi upright view at 0518 hours. Stable left chest tube. Small left apical pneumothorax re - demonstrated and not significantly changed since yesterday. Stable endotracheal tube. Stable visible enteric tube. Left chest cardiac AICD. Stable cardiomegaly and mediastinal contours. Calcified aortic atherosclerosis. Stable veiling opacity at the right lung base. No pneumothorax or pulmonary edema. The left lung is clear. IMPRESSION: 1.  Stable lines and tubes. 2. Small left apical pneumothorax has not significantly changed. 3. Stable small right pleural effusion with right lower lobe collapse or consolidation. Electronically Signed   By: Genevie Ann M.D.   On: 06/15/2016 08:31   BMET    Component Value Date/Time   NA 135 06/16/2016 0542   K 2.8 (L) 06/16/2016 0542   CL 92 (L) 06/16/2016 0542   CO2 29 06/16/2016 0542   GLUCOSE 155 (H) 06/16/2016 0542   BUN 87 (H) 06/16/2016 0542  CREATININE 2.51 (H) 06/16/2016 0542   CREATININE 1.32 (H) 05/30/2016 1336   CALCIUM 7.7 (L) 06/16/2016 0542   GFRNONAA 23 (L) 06/16/2016 0542   GFRAA 26 (L) 06/16/2016 0542   CBC    Component Value Date/Time   WBC 7.9 06/16/2016 0542   RBC 4.31 06/16/2016 0542   HGB 14.1 06/16/2016 0542   HCT 42.2 06/16/2016 0542   PLT 34 (L) 06/16/2016 0542   MCV 97.9 06/16/2016 0542   MCH 32.7 06/16/2016 0542   MCHC 33.4 06/16/2016 0542   RDW 16.2 (H) 06/16/2016 0542   LYMPHSABS 2.1 06/17/2016 2111   MONOABS 1.0 06/12/2016 2111   EOSABS 0.0 06/07/2016 2111   BASOSABS 0.0 06/06/2016 2111     Assessment: 1. Acute on CKD 3 (baseline Scr mid 1's), UO  excellent, Scr sl lower 2. Acute on chronic systolic ht failure EF 00-92%.  On 3 pressors 3. Hyperkalemia improved, now K low 4. Elevated LFT's due to shock liver 5. Hyponatremia, improved  Plan: 1. Lasix on hold 2. K being replaced 3. Daily labs.  Recheck PO4 in AM Jerimiah Wolman T

## 2016-06-16 NOTE — Progress Notes (Signed)
Pharmacy Antibiotic Note  [name] is a 82 y.o. male  With r/o  sepsis.  Pharmacy has been consulted for vancomycin and zosyn dosing.  Now de-escalating therapy to Ancef.  -WBC= 9, afebrile, SCr= 2.71 (trend down; noted 1.3-1.6 earlier this year) CrCl ~ 20 BCID and TA with MSSA  Plan: Stop vancomycin and zosyn Ancef 2gm IV q12h    Height: 5\' 4"  (162.6 cm) Weight: 146 lb 2.6 oz (66.3 kg) IBW/kg (Calculated) : 59.2  Temp (24hrs), Avg:99.8 F (37.7 C), Min:98.9 F (37.2 C), Max:101.5 F (38.6 C)   Recent Labs Lab 06/18/2016 2111  06/10/16 1412  06/14/16 0352 06/14/16 1700 06/15/16 0454 06/15/16 1727 06/16/16 0542  WBC 10.4  --  10.8*  --  8.2  --  9.0  --  7.9  CREATININE 1.59*  < >  --   < > 3.57* 3.23* 3.09* 2.71* 2.51*  < > = values in this interval not displayed.  Estimated Creatinine Clearance: 20 mL/min (by C-G formula based on SCr of 2.51 mg/dL (H)).    Allergies  Allergen Reactions  . Novocain [Procaine] Nausea And Vomiting and Other (See Comments)    Passes out (also)  . Lipitor [Atorvastatin] Other (See Comments)    Myalgias    . Procaine Hcl Nausea And Vomiting  . Tramadol Itching and Nausea And Vomiting    Antimicrobials this admission: Zosyn 3/10>>3/11 vanc 3/10>>3/11 Ancef 3/11>>  Dose adjustments this admission:   Microbiology results: 3/10 resp MSSA 3/10 blood MSSA 3/10 urine   Leota Sauers Pharm.D. CPP, BCPS Clinical Pharmacist [phone] 06/16/2016 2:48 PM

## 2016-06-16 NOTE — Progress Notes (Signed)
  PHARMACY - PHYSICIAN COMMUNICATION CRITICAL VALUE ALERT - BLOOD CULTURE IDENTIFICATION (BCID)  Results for orders placed or performed during the hospital encounter of 2016-07-04  Blood Culture ID Panel (Reflexed) (Collected: 06/15/2016  6:45 PM)  Result Value Ref Range   Enterococcus species NOT DETECTED NOT DETECTED   Listeria monocytogenes NOT DETECTED NOT DETECTED   Staphylococcus species DETECTED (A) NOT DETECTED   Staphylococcus aureus DETECTED (A) NOT DETECTED   Methicillin resistance NOT DETECTED NOT DETECTED   Streptococcus species NOT DETECTED NOT DETECTED   Streptococcus agalactiae NOT DETECTED NOT DETECTED   Streptococcus pneumoniae NOT DETECTED NOT DETECTED   Streptococcus pyogenes NOT DETECTED NOT DETECTED   Acinetobacter baumannii NOT DETECTED NOT DETECTED   Enterobacteriaceae species NOT DETECTED NOT DETECTED   Enterobacter cloacae complex NOT DETECTED NOT DETECTED   Escherichia coli NOT DETECTED NOT DETECTED   Klebsiella oxytoca NOT DETECTED NOT DETECTED   Klebsiella pneumoniae NOT DETECTED NOT DETECTED   Proteus species NOT DETECTED NOT DETECTED   Serratia marcescens NOT DETECTED NOT DETECTED   Haemophilus influenzae NOT DETECTED NOT DETECTED   Neisseria meningitidis NOT DETECTED NOT DETECTED   Pseudomonas aeruginosa NOT DETECTED NOT DETECTED   Candida albicans NOT DETECTED NOT DETECTED   Candida glabrata NOT DETECTED NOT DETECTED   Candida krusei NOT DETECTED NOT DETECTED   Candida parapsilosis NOT DETECTED NOT DETECTED   Candida tropicalis NOT DETECTED NOT DETECTED    Name of physician (or Provider) Contacted: Dr. Gala Romney  Changes to prescribed antibiotics required: No changes at this time.  Currently treated with Vancomycin and Zosyn both treat MSSA.  Continue broad coverage until TA Cx final to ensure no gram negative coverage needed.  Can de-escalate tomorrow.    Leota Sauers Pharm.D. CPP, BCPS Clinical Pharmacist [phone] 06/16/2016 1:28 PM

## 2016-06-17 ENCOUNTER — Inpatient Hospital Stay (HOSPITAL_COMMUNITY): Payer: Medicare HMO

## 2016-06-17 DIAGNOSIS — A419 Sepsis, unspecified organism: Secondary | ICD-10-CM

## 2016-06-17 DIAGNOSIS — I4891 Unspecified atrial fibrillation: Secondary | ICD-10-CM

## 2016-06-17 DIAGNOSIS — S40812A Abrasion of left upper arm, initial encounter: Secondary | ICD-10-CM

## 2016-06-17 DIAGNOSIS — A4101 Sepsis due to Methicillin susceptible Staphylococcus aureus: Secondary | ICD-10-CM

## 2016-06-17 DIAGNOSIS — R6521 Severe sepsis with septic shock: Secondary | ICD-10-CM

## 2016-06-17 LAB — PREPARE FRESH FROZEN PLASMA
UNIT DIVISION: 0
Unit division: 0

## 2016-06-17 LAB — PHOSPHORUS: PHOSPHORUS: 2.2 mg/dL — AB (ref 2.5–4.6)

## 2016-06-17 LAB — GLUCOSE, CAPILLARY
GLUCOSE-CAPILLARY: 146 mg/dL — AB (ref 65–99)
GLUCOSE-CAPILLARY: 173 mg/dL — AB (ref 65–99)
GLUCOSE-CAPILLARY: 204 mg/dL — AB (ref 65–99)
GLUCOSE-CAPILLARY: 182 mg/dL — AB (ref 65–99)
GLUCOSE-CAPILLARY: 97 mg/dL (ref 65–99)
GLUCOSE-CAPILLARY: 183 mg/dL — AB (ref 65–99)
GLUCOSE-CAPILLARY: 192 mg/dL — AB (ref 65–99)
Glucose-Capillary: 160 mg/dL — ABNORMAL HIGH (ref 65–99)
Glucose-Capillary: 222 mg/dL — ABNORMAL HIGH (ref 65–99)
Glucose-Capillary: 90 mg/dL (ref 65–99)
Glucose-Capillary: 70 mg/dL (ref 65–99)
Glucose-Capillary: 232 mg/dL — ABNORMAL HIGH (ref 65–99)

## 2016-06-17 LAB — COMPREHENSIVE METABOLIC PANEL
ALK PHOS: 94 U/L (ref 38–126)
ALT: 200 U/L — ABNORMAL HIGH (ref 17–63)
ANION GAP: 16 — AB (ref 5–15)
AST: 52 U/L — ABNORMAL HIGH (ref 15–41)
Albumin: 2.9 g/dL — ABNORMAL LOW (ref 3.5–5.0)
BUN: 113 mg/dL — ABNORMAL HIGH (ref 6–20)
CALCIUM: 8.1 mg/dL — AB (ref 8.9–10.3)
CHLORIDE: 96 mmol/L — AB (ref 101–111)
CO2: 27 mmol/L (ref 22–32)
Creatinine, Ser: 2.69 mg/dL — ABNORMAL HIGH (ref 0.61–1.24)
GFR calc non Af Amer: 21 mL/min — ABNORMAL LOW (ref >60)
GFR, EST AFRICAN AMERICAN: 24 mL/min — AB (ref >60)
Glucose, Bld: 145 mg/dL — ABNORMAL HIGH (ref 65–99)
Potassium: 3.5 mmol/L (ref 3.5–5.1)
SODIUM: 139 mmol/L (ref 135–145)
Total Bilirubin: 3.5 mg/dL — ABNORMAL HIGH (ref 0.3–1.2)
Total Protein: 6 g/dL — ABNORMAL LOW (ref 6.5–8.1)

## 2016-06-17 LAB — CBC
HEMATOCRIT: 48.7 % (ref 39.0–52.0)
Hemoglobin: 16.6 g/dL (ref 13.0–17.0)
MCH: 33.7 pg (ref 26.0–34.0)
MCHC: 34.1 g/dL (ref 30.0–36.0)
MCV: 99 fL (ref 78.0–100.0)
Platelets: 46 10*3/uL — ABNORMAL LOW (ref 150–400)
RBC: 4.92 MIL/uL (ref 4.22–5.81)
RDW: 16.9 % — AB (ref 11.5–15.5)
WBC: 17.2 10*3/uL — AB (ref 4.0–10.5)

## 2016-06-17 LAB — BPAM FFP
Blood Product Expiration Date: 201803112359
Blood Product Expiration Date: 201803112359
ISSUE DATE / TIME: 201803081828
ISSUE DATE / TIME: 201803081828
UNIT TYPE AND RH: 5100
Unit Type and Rh: 5100

## 2016-06-17 LAB — PROTIME-INR
INR: 1.73
Prothrombin Time: 20.5 seconds — ABNORMAL HIGH (ref 11.4–15.2)

## 2016-06-17 LAB — COOXEMETRY PANEL
Carboxyhemoglobin: 1.3 % (ref 0.5–1.5)
METHEMOGLOBIN: 0.7 % (ref 0.0–1.5)
O2 Saturation: 60.8 %
TOTAL HEMOGLOBIN: 16.9 g/dL — AB (ref 12.0–16.0)

## 2016-06-17 LAB — PROCALCITONIN: PROCALCITONIN: 6.8 ng/mL

## 2016-06-17 MED ORDER — ACETAMINOPHEN 160 MG/5ML PO SOLN
1000.0000 mg | Freq: Once | ORAL | Status: AC
Start: 2016-06-17 — End: 2016-06-17
  Administered 2016-06-17: 1000 mg via ORAL
  Filled 2016-06-17: qty 40.6

## 2016-06-17 MED ORDER — POTASSIUM CHLORIDE 20 MEQ/15ML (10%) PO SOLN
40.0000 meq | Freq: Once | ORAL | Status: AC
  Administered 2016-06-17: 40 meq via ORAL
  Filled 2016-06-17: qty 30

## 2016-06-17 MED ORDER — FUROSEMIDE 10 MG/ML IJ SOLN
80.0000 mg | Freq: Once | INTRAMUSCULAR | Status: AC
  Administered 2016-06-17: 80 mg via INTRAVENOUS
  Filled 2016-06-17: qty 8

## 2016-06-17 NOTE — Progress Notes (Signed)
Patient ID: [name], male   DOB: Dec 12, 1934, 82 y.o.   MRN: [id number]  [facility] Progress Note    Assessment/ Plan:   1. AKI on chronic kidney disease stage III: Suspected to be ATN associated with sepsis/hemodynamically mediated with CHF decompensation. Restart furosemide this morning per cardiology to augment urine output/diuresis. Discussed with daughter that I would not recommend any forms of renal replacement therapy for the patient given burden of comorbidities. 2. Acute hypoxic respiratory failure with left pneumothorax status post chest tube-continue ventilator support per CCM 3. MSSA bacteremia/sepsis: Suspected to be associated with pacer wire infection/endocarditis-on Ancef 4. Anoxic brain injury status post fall/seizure  Subjective:   No acute events with the patient overnight. Wife who was at bedside fell out of her wheelchair earlier today and was being transported to the emergency room when I went to see patient.    Objective:   BP (!) 91/52 (BP Location: Left Arm)   Pulse (!) 130   Temp 98.2 F (36.8 C) (Axillary)   Resp (!) 29   Ht _0  (1.626 m)   Wt 66.2 kg (145 lb 15.1 oz)   SpO2 98%   BMI 25.05 kg/m   Intake/Output Summary (Last 24 hours) at 06/17/16 1136 Last data filed at 06/17/16 0600  Gross per 24 hour  Intake          1874.89 ml  Output             1495 ml  Net           379.89 ml   Weight change: -0.1 kg (-3.5 oz)  Physical Exam: IOE:[id number], sedated CVS: Irregularly Irregular tachycardia Resp: Coarse breath sounds bilaterally, left chest tube Abd: Soft, obese, nontender Ext: Trace ankle edema  Imaging: Dg Chest Port 1 View  Result Date: 06/17/2016 CLINICAL DATA:  Respiratory failure, COPD, CHF, atrial fibrillation, former heavy smoker. EXAM: PORTABLE CHEST 1 VIEW COMPARISON:  Portable chest x-ray of March 11th 2018 FINDINGS: The lungs are adequately inflated. There is no focal infiltrate, pneumothorax, or significant  pleural effusion. There is persistent subsegmental atelectasis at both lung bases. The cardiac silhouette is enlarged but stable. The central pulmonary vascularity is mildly prominent. Of the endotracheal tube tip lies 4.6 cm above the carina. The feeding tube tip projects below the inferior margin of the image. The ICD is in stable position. The left internal jugular venous catheter tip projects over the proximal SVC. A pigtail catheter projects just inferior to the aortic arch and is stable. IMPRESSION: Improving aeration of both lungs with decreased interstitial edema. Persistent subsegmental atelectasis in the right infrahilar region. No pneumothorax or significant pleural effusion. The support devices are in reasonable position. Cardiomegaly.  Thoracic aortic atherosclerosis. Electronically Signed   By: David  Martinique M.D.   On: 06/17/2016 07:30   Dg Chest Port 1 View  Result Date: 06/16/2016 CLINICAL DATA:  82 year old male with hyponatremia, falls. Intubated with subsequent left pneumothorax, treated with left chest tube. EXAM: PORTABLE CHEST 1 VIEW COMPARISON:  06/15/2016 and earlier. FINDINGS: Portable AP semi upright view at 0459 hours. Stable left chest tube. No pneumothorax visible today. Endotracheal tube, visible enteric tube, and left IJ central line are stable. Stable left chest cardiac AICD. Stable cardiomegaly and mediastinal contours. Stable lung volumes and ventilation aside from decreased veiling opacity at the right lung base. IMPRESSION: 1.  Stable lines and tubes.  No left pneumothorax visible today. 2. Improved right lung base ventilation. No new cardiopulmonary  abnormality. Electronically Signed   By: Genevie Ann M.D.   On: 06/16/2016 07:49    Labs: BMET  Recent Labs Lab 06/13/16 1600 06/14/16 0352 06/14/16 0951 06/14/16 1700 06/15/16 0454 06/15/16 1727 06/16/16 0542 06/17/16 0401  NA 126* 127*  --  128* 131* 135 135 139  K 5.9* 4.6  --  3.5 3.3* 3.2* 2.8* 3.5  CL 91* 90*   --  87* 87* 89* 92* 96*  CO2 18* 25  --  _0 GLUCOSE 159* 104*  --  129* 119* 182* 155* 145*  BUN 75* 77*  --  76* 82* 88* 87* 113*  CREATININE 3.78* 3.57*  --  3.23* 3.09* 2.71* 2.51* 2.69*  CALCIUM 9.0 9.1  --  8.9 8.7* 8.8* 7.7* 8.1*  PHOS  --  5.7* 5.1* 4.1 3.6 2.3*  --  2.2*   CBC  Recent Labs Lab 06/14/16 0352 06/15/16 0454 06/16/16 0542 06/17/16 0401  WBC 8.2 9.0 7.9 17.2*  HGB 13.8 14.1 14.1 16.6  HCT 39.7 41.6 42.2 48.7  MCV 96.6 97.7 97.9 99.0  PLT 69* 50* 34* 46*    Medications:    .  ceFAZolin (ANCEF) IV  2 g Intravenous Q12H  . chlorhexidine gluconate (MEDLINE KIT)  15 mL Mouth Rinse BID  . Chlorhexidine Gluconate Cloth  6 each Topical Daily  . famotidine  20 mg Per Tube Daily  . feeding supplement (PRO-STAT SUGAR FREE 64)  60 mL Per Tube BID  . feeding supplement (VITAL 1.5 CAL)  1,000 mL Per Tube Q24H  . furosemide  80 mg Intravenous Once  . insulin aspart  2-6 Units Subcutaneous Q4H  . levETIRAcetam  500 mg Intravenous Q12H  . mouth rinse  15 mL Mouth Rinse 10 times per day  . sodium chloride flush  10-40 mL Intracatheter Q12H  . sodium chloride flush  3 mL Intravenous Q12H   Elmarie Shiley, MD 06/17/2016, 11:36 AM

## 2016-06-17 NOTE — Progress Notes (Signed)
Advanced Heart Failure Rounding Note   Subjective:    3/8 Intubated, then developed left PTX thought to be due to positive pressure ventilation. CT placed.   Found to have MSSA in sputum and blood with septic shock.   Remains on Norepi 2 and vasopressin, off dobutamine.  PCT > 6.80. Probable RLL PNA. Remains on vanc/zosyn.  Lasix on hold.   He is off sedation since 8a 06/15/16 but not waking up. Has some reflexive response. Toes move upward, mild pupillary response to light. No purposeful movement over vent (on SBT). Responds minimally to pain (mild grimace).   AF rate back up into 120s this am.  On amiodarone 30 mg/hr. CVP 15.   EEG: With diffuse slowing and focal left cerebral dysfunction   CO-OX 60.8 today OFF dobutamine.   Creatinine 2.24> 3.4>3.72>3.57 > 3.09 > 2.5 > 2.69  Objective:   Weight Range:  Vital Signs:   Temp:  [98.1 F (36.7 C)-99.4 F (37.4 C)] 98.2 F (36.8 C) (03/12 0741) Pulse Rate:  [92-137] 130 (03/12 0741) Resp:  [13-26] 25 (03/12 0741) BP: (82-129)/(46-111) 101/82 (03/12 0600) SpO2:  [93 %-100 %] 98 % (03/12 0741) FiO2 (%):  [40 %] 40 % (03/12 0741) Weight:  [145 lb 15.1 oz (66.2 kg)] 145 lb 15.1 oz (66.2 kg) (03/12 0404) Last BM Date: 06/15/16  Weight change: Filed Weights   06/15/16 0438 06/16/16 0345 06/17/16 0404  Weight: 149 lb 7.6 oz (67.8 kg) 146 lb 2.6 oz (66.3 kg) 145 lb 15.1 oz (66.2 kg)    Intake/Output:   Intake/Output Summary (Last 24 hours) at 06/17/16 0743 Last data filed at 06/17/16 0600  Gross per 24 hour  Intake          2394.49 ml  Output             1495 ml  Net           899.49 ml     Physical Exam: CVP 15 General:  Intubated, minimally responsive   HEENT: Normal Neck: supple. JVP elevated to jaw. Carotids 2+ bilat; no bruits. No thyromegaly or nodule noted. LIJ  Cor: PMI nondisplaced. IRR tachy Lungs: Rhonchi throughout Abdomen: soft, ND, no HSM. No bruits or masses. +BS  Extremities: no cyanosis,  clubbing, rash, edema. R and LLE SCDs. Diffuse echymosis Neuro: Intubated, not responsive  Telemetry: Reviewed personally. A fib 110-120s  Labs: Basic Metabolic Panel:  Recent Labs Lab 06/14/16 0352 06/14/16 0951 06/14/16 1700 06/15/16 0454 06/15/16 1727 06/16/16 0542 06/17/16 0401  NA 127*  --  128* 131* 135 135 139  K 4.6  --  3.5 3.3* 3.2* 2.8* 3.5  CL 90*  --  87* 87* 89* 92* 96*  CO2 25  --  _0 GLUCOSE 104*  --  129* 119* 182* 155* 145*  BUN 77*  --  76* 82* 88* 87* 113*  CREATININE 3.57*  --  3.23* 3.09* 2.71* 2.51* 2.69*  CALCIUM 9.1  --  8.9 8.7* 8.8* 7.7* 8.1*  MG 3.0* 2.8* 2.6* 2.3 2.0  --   --   PHOS 5.7* 5.1* 4.1 3.6 2.3*  --  2.2*    Liver Function Tests:  Recent Labs Lab 06/13/16 0833 06/14/16 0352 06/15/16 0454 06/16/16 0542 06/17/16 0401  AST 2,378* 794* 227* 75* 52*  ALT 1,567* 1,057* 633* 324* 200*  ALKPHOS 96 85 83 76 94  BILITOT 3.5* 3.3* 4.0* 3.9* 3.5*  PROT 6.2* 5.7* 5.4* 4.8*  6.0*  ALBUMIN 4.0 3.5 3.2* 2.5* 2.9*   No results for input(s): LIPASE, AMYLASE in the last 168 hours.  Recent Labs Lab 06/11/16 0511 06/12/16 1342 06/13/16 1010  AMMONIA 67* 45* 38*    CBC:  Recent Labs Lab 06/10/16 1412 06/14/16 0352 06/15/16 0454 06/16/16 0542 06/17/16 0401  WBC 10.8* 8.2 9.0 7.9 17.2*  HGB 15.7 13.8 14.1 14.1 16.6  HCT 47.0 39.7 41.6 42.2 48.7  MCV 99.8 96.6 97.7 97.9 99.0  PLT 153 69* 50* 34* PENDING    Cardiac Enzymes:  Recent Labs Lab 06/12/16 1342 06/12/16 1530 06/13/16 1010 06/13/16 1600 06/13/16 2157  CKTOTAL  --  38*  --   --   --   TROPONINI 0.38*  --  0.25* 0.26* 0.22*    BNP: BNP (last 3 results)  Recent Labs  05/30/16 1336 06/17/2016 2111 06/12/16 1343  BNP 1,104.3* 1,879.0* 2,370.0*    ProBNP (last 3 results) No results for input(s): PROBNP in the last 8760 hours.    Other results:  Imaging: Dg Chest Port 1 View  Result Date: 06/17/2016 CLINICAL DATA:  Respiratory failure,  COPD, CHF, atrial fibrillation, former heavy smoker. EXAM: PORTABLE CHEST 1 VIEW COMPARISON:  Portable chest x-ray of March 11th 2018 FINDINGS: The lungs are adequately inflated. There is no focal infiltrate, pneumothorax, or significant pleural effusion. There is persistent subsegmental atelectasis at both lung bases. The cardiac silhouette is enlarged but stable. The central pulmonary vascularity is mildly prominent. Of the endotracheal tube tip lies 4.6 cm above the carina. The feeding tube tip projects below the inferior margin of the image. The ICD is in stable position. The left internal jugular venous catheter tip projects over the proximal SVC. A pigtail catheter projects just inferior to the aortic arch and is stable. IMPRESSION: Improving aeration of both lungs with decreased interstitial edema. Persistent subsegmental atelectasis in the right infrahilar region. No pneumothorax or significant pleural effusion. The support devices are in reasonable position. Cardiomegaly.  Thoracic aortic atherosclerosis. Electronically Signed   By: David  Martinique M.D.   On: 06/17/2016 07:30   Dg Chest Port 1 View  Result Date: 06/16/2016 CLINICAL DATA:  82 year old male with hyponatremia, falls. Intubated with subsequent left pneumothorax, treated with left chest tube. EXAM: PORTABLE CHEST 1 VIEW COMPARISON:  06/15/2016 and earlier. FINDINGS: Portable AP semi upright view at 0459 hours. Stable left chest tube. No pneumothorax visible today. Endotracheal tube, visible enteric tube, and left IJ central line are stable. Stable left chest cardiac AICD. Stable cardiomegaly and mediastinal contours. Stable lung volumes and ventilation aside from decreased veiling opacity at the right lung base. IMPRESSION: 1.  Stable lines and tubes.  No left pneumothorax visible today. 2. Improved right lung base ventilation. No new cardiopulmonary abnormality. Electronically Signed   By: Genevie Ann M.D.   On: 06/16/2016 07:49      Medications:     Scheduled Medications: .  ceFAZolin (ANCEF) IV  2 g Intravenous Q12H  . chlorhexidine gluconate (MEDLINE KIT)  15 mL Mouth Rinse BID  . Chlorhexidine Gluconate Cloth  6 each Topical Daily  . famotidine  20 mg Per Tube Daily  . feeding supplement (PRO-STAT SUGAR FREE 64)  60 mL Per Tube BID  . feeding supplement (VITAL 1.5 CAL)  1,000 mL Per Tube Q24H  . insulin aspart  2-6 Units Subcutaneous Q4H  . levETIRAcetam  500 mg Intravenous Q12H  . mouth rinse  15 mL Mouth Rinse 10 times per day  .  sodium chloride flush  10-40 mL Intracatheter Q12H  . sodium chloride flush  3 mL Intravenous Q12H    Infusions: . amiodarone 30 mg/hr (06/17/16 0525)  . norepinephrine (LEVOPHED) Adult infusion 2 mcg/min (06/16/16 0818)  . propofol (DIPRIVAN) infusion Stopped (06/15/16 0804)  . vasopressin (PITRESSIN) infusion - *FOR SHOCK* 0.03 Units/min (06/16/16 1608)    PRN Medications: fentaNYL (SUBLIMAZE) injection, ipratropium-albuterol, MUSCLE RUB, [DISCONTINUED] ondansetron **OR** ondansetron (ZOFRAN) IV, sodium chloride flush   Assessment/Plan/Discussin    1.  A/C Biventricular Heart Failure -> cardiogenic shock --echo 3/5 EF 15-20% RV moderately HK. ICM. (EF similar to past) --Now with septic shock.  - Remains on levophed and vasopressin.  - Coox stable this am off dobutamine, but sepsis may be contributing.   - Volume status elevated.  CVP 15 this am. Diuretics held yesterday with sepsis. May need to give dose of lasix today.  Pressures remain slightly soft.  - Shock liver improving.   - Creatinine trended down but now back up slightly.  - No BB blocker or ACE/ARB with shock/AKI 2. Acute respiratory failure and tension PTX - remains intubated -s/p chest tube -now with MSSA HCAP. Vanc/zosyn started 3/10.  D/w Dr. Johnnye Sima at [facility] on vent. CCM following. Tolerating SBT but remains unresponsive 3. MSSA septic shock/HCAP - MSSA in sputum and blood.  Vanc/zosyn started 3/10. Narrowed to Ancef 06/16/16 - Continue vasopressor support. Will stop dobutamine.  - ID now on board and switched to Ancef.  - He has ICD in place. If recovers will need TEE to look at ICD. ? Need for rifampin - WBC 7 -> 17. PCT up to > 6. 4. AKI on CKD - Likely ATN due to hypotension/shock. Cardiorenal syndrome - Nephrology following. Creatinine coming down on inotropes.  -Off lasix in setting of sepsis. Watch renal function closely 5. Anoxic brain injury -- Admitted after fall. CT of head negative for bleed.  -- Results of EEG reviewed. Will continue to follow off sedation. Prognosis remains poor.  -- May need to have neurology see.  6. Chronic A fib - Rate up in setting of sepsis. Unable to use b-blocker with low BP/shock.  - Amio added 3/10 (careful with liver dysfunction) stopped by 06/16/16. But added back last night with increased rates.   - Not anticoagulated due to falls. SCDs in place.  7. CAD with ICM --No chest pain.  On ASA 81.  No statin with markedly elevated LFTs.  --No recent cath. Myoview 2/14. Extensive scar LAD and RCA territories. Not gated due to AF 8. Shock liver - GI has seen. [place] Abd/Kidney completed. No acute findings. Likely due to shock.  - Trending down slightly.  9. Hypokalemia - Continue supp.    Pt remains critically ill with poor prognosis.   Length of Stay: [address]  Annamaria Helling  06/17/2016, 7:43 AM  Advanced Heart Failure Team Pager [phone] (M-F; 7a - 4p)  Please contact [facility] for night-coverage after hours (4p -7a ) and weekends on [email or web address]  1. Acute on chronic systolic CHF: Ischemic cardiomyopathy, has ICD. Echo with EF 15-20%, similar to the past. Some of the LFT rise may be due to congestive hepatopathy. Possible hypotensive event prior to admission with shock liver.  He is on norepinephrine and vasopressin now, think septic shock. Today, CVP 15 with co-ox 61%.   - Continue pressors, wean as BP  tolerates.  - Will give dose of Lasix 80 mg IV x 1 and follow response.  2. CAD: No chest pain. On ASA 81. No statin with markedly elevated LFTs.  3. AKI on CKD stage III: Suspect cardiorenal syndrome playing a role, ?ATN from hypotensive episode around time of initial admission (though markedly low BP has not been recorded).  Creatinine has stabilized but back up a bit today.  4. Acute hepatic injury: Suspect shock liver from hypotensive event. Suspect also component of rise from hepatic congestion. Maintain BP and cardiac output. LFTs have been trending down.  5. Neuro: History of seizures.  Abnormal EEG.  Minimally responsive off sedation.  Cannot do MRI with ICD, ?repeat head CT.   6. ID: MSSA in trach aspirate and blood.  Suspect PNA with septic shock.  Elevated PCT.  On cefazolin, ID following.  Strong risk of seeding of ICD.  If he shows neurologic recovery, he will need a TEE.  7. Chronic atrial fibrillation: Has not been anticoagulated due to prior falls. HR in 110s-120s today.  Continue amiodarone gtt.  If goes any higher, increase to 60 mg/hr.  8. Thrombocytopenia: Suspect due to sepsis.  Not getting any heparin.  9.  PTX: On left, due to positive pressure ventilation, has chest tube.  10. Shock: Mixed septic/cardiogenic shock in setting of MSSA bacteremia and baseline low EF.  Continue norepinephrine and vasopressin, wean as tolerated.   The patient is critically ill with poor prognosis at this point.  Family updated at bedside.  35 minutes critical care time.   Loralie Champagne 06/17/2016 11:32 AM

## 2016-06-17 NOTE — Progress Notes (Signed)
Pt's oral temp 103.2. Rechecked with different thermometer. Temp still 103.2. Paged Tyson Alias, MD for orders. Verbal order for 1 gram Tylenol to be given and cooling blanket. Will administer and monitor acutely.    Ronne Savoia N. Jamelle Haring, RN

## 2016-06-17 NOTE — Progress Notes (Addendum)
PULMONARY / CRITICAL CARE MEDICINE   Name: CHELSEA NUSZ MRN: [id number] DOB: [birth year]    ADMISSION DATE:  06/16/2016 CONSULTATION DATE:  06/13/2016  REFERRING MD:  Dr. Caleb Popp   CHIEF COMPLAINT:  Acute on Chronic HF  HISTORY OF PRESENT ILLNESS:   82 yo male presented to [facility] 3/05 after fall.  He was recently started on lamictal for seizures.  Transferred to [facility] for Rt heart cath.  SUBJECTIVE:  Rvr, amio restart  VITAL SIGNS: BP (!) 91/52 (BP Location: Left Arm)   Pulse (!) 130   Temp 98.2 F (36.8 C) (Axillary)   Resp (!) 29   Ht 5\' 4"  (1.626 m)   Wt 66.2 kg (145 lb 15.1 oz)   SpO2 96%   BMI 25.05 kg/m   HEMODYNAMICS: CVP:  [10 mmHg-14 mmHg] 14 mmHg  INTAKE / OUTPUT: I/O last 3 completed shifts: In: 4236.2 [I.V.:1291.2; NG/GT:1280; IV Piggyback:1665] Out: 3570 [Urine:3500; Chest Tube:70]  PHYSICAL EXAMINATION: General: not on sedation, rass -4 Neuro: RASS -4, per sluggish 2mm, localizes to touch feet HEENT: ETT , jvd present Cardiac:  s1 s2 irregular, tachycardic Chest: ronchi, CT  70 output, no leak, remains to suction Abd: soft, non tender Ext: no edema Skin: no rashes  LABS:  BMET  Recent Labs Lab 06/15/16 1727 06/16/16 0542 06/17/16 0401  NA 135 135 139  K 3.2* 2.8* 3.5  CL 89* 92* 96*  CO2 31 29 27   BUN 88* 87* 113*  CREATININE 2.71* 2.51* 2.69*  GLUCOSE 182* 155* 145*    Electrolytes  Recent Labs Lab 06/14/16 1700 06/15/16 0454 06/15/16 1727 06/16/16 0542 06/17/16 0401  CALCIUM 8.9 8.7* 8.8* 7.7* 8.1*  MG 2.6* 2.3 2.0  --   --   PHOS 4.1 3.6 2.3*  --  2.2*    CBC  Recent Labs Lab 06/15/16 0454 06/16/16 0542 06/17/16 0401  WBC 9.0 7.9 17.2*  HGB 14.1 14.1 16.6  HCT 41.6 42.2 48.7  PLT 50* 34* 46*    Coag's  Recent Labs Lab 06/15/16 0454 06/16/16 0542 06/17/16 0401  INR 1.97 1.89 1.73    Sepsis Markers  Recent Labs Lab 06/15/16 1727 06/16/16 0542 06/17/16 0401  PROCALCITON 2.18 3.08 6.80    ABG  Recent  Labs Lab 06/12/16 1730 06/13/16 1445 06/15/16 0357  PHART 7.347* 7.478* 7.495*  PCO2ART 25.8* 26.2* 38.6  PO2ART 90.0 102.0 132*    Liver Enzymes  Recent Labs Lab 06/15/16 0454 06/16/16 0542 06/17/16 0401  AST 227* 75* 52*  ALT 633* 324* 200*  ALKPHOS 83 76 94  BILITOT 4.0* 3.9* 3.5*  ALBUMIN 3.2* 2.5* 2.9*    Cardiac Enzymes  Recent Labs Lab 06/13/16 1010 06/13/16 1600 06/13/16 2157  TROPONINI 0.25* 0.26* 0.22*    Glucose  Recent Labs Lab 06/16/16 1154 06/16/16 1610 06/16/16 1908 06/17/16 0011 06/17/16 0402 06/17/16 0821  GLUCAP 173* 204* 182* 222* 160* 90    Imaging Dg Chest Port 1 View  Result Date: 06/17/2016 CLINICAL DATA:  Respiratory failure, COPD, CHF, atrial fibrillation, former heavy smoker. EXAM: PORTABLE CHEST 1 VIEW COMPARISON:  Portable chest x-ray of March 11th 2018 FINDINGS: The lungs are adequately inflated. There is no focal infiltrate, pneumothorax, or significant pleural effusion. There is persistent subsegmental atelectasis at both lung bases. The cardiac silhouette is enlarged but stable. The central pulmonary vascularity is mildly prominent. Of the endotracheal tube tip lies 4.6 cm above the carina. The feeding tube tip projects below the inferior margin of the image.  The ICD is in stable position. The left internal jugular venous catheter tip projects over the proximal SVC. A pigtail catheter projects just inferior to the aortic arch and is stable. IMPRESSION: Improving aeration of both lungs with decreased interstitial edema. Persistent subsegmental atelectasis in the right infrahilar region. No pneumothorax or significant pleural effusion. The support devices are in reasonable position. Cardiomegaly.  Thoracic aortic atherosclerosis. Electronically Signed   By: David  [place] M.D.   On: 06/17/2016 07:30    STUDIES:  CT C Spine 3/4 > Neg acute injury  CT Head 3/4 > No acute of intracranial or cervical spine injury  ECHO 3/5 > 15-20% EF,  RV moderately dilated with mild to moderately decreases systolic function  ABD [place] 3/7 > Normal hepatic vascular doppler evaluation, abdominal ascites and right pleural effusion  Renal US 3/7 > No acute findings, renal cortical atrophy and possible chronic medical rental disease, suspected nonobstructive left lower pole renal calculus, cirrhotic appearing liver with small to moderate volume ascites  ANTIBIOTICS: Zosyn 3/10 >>3/11 Vancomycin 3/10 >>3/11 Ancef 3/11  CULTURES: Urine 3/10 >> Blood 3/10 >>staph aureus Sputum 3/10 >> 3/13 BC planned  SIGNIFICANT EVENTS: 3/4 ED for fall/SI  3/7 Transfer to [facility] for cardiogenic shock, multiorgan failure 3/8 Transfer to ICU, intubated, Lt CT placed for pneumothorax, Lt IJ placed 3/10 Fever, increased WBC, increased procalcitonin >> start Abx for HCAP  LINES/TUBES: OETT 3/8 >> Lt chest tube 3/8 >> Lt IJ CVC 3/8 >>>3/12  DISCUSSION: 82 year old male with extensive cardiac and renal history. Admitted 3/4 post-fall with suicidal ideation. Admission complicated by acute on chronic systolic HF, acute hepatic injury, and acute on chronic kidney disease.   ASSESSMENT / PLAN:  Acute hypoxic respiratory failure in setting of cardiogenic shock. Lt tension PTX. Hx of OSA. - will keep CT to suction further, likely to water seal in am as recent pcxr ptx apical small -weaning 8/5 pcxr rt base improving overall  Sepsis Bacteremia, presumed endocarditis / pacer wire - ancef -repeat BC in am  -dc line neck, give holiday, pct rise  Acute on chronic systolic CHF (EF 15 to 20%) Cardiogenic/septic shock. Hx of CAD, HTN, HLD. Hx of A fib >> no anticoagulation due to frequent falls. - dobutamine per cardiology - off - vaso off -levo low dose, consider map 55  AKI. Hx of CKD 3. - goal even fluid balance Follow trend crt  Acute hepatic injury 2nd to cardiogenic shock. Hx of cirrhosis. - f/u LFTs  Hx of DM. - SSI  NEURO Acute metabolic  encephalopathy. Hx of seizures. - continue keppra -eeg done -likley will need MRI brain, unless drastically improved  Suicidal ideation with hx of anxiety, depression. - will need psych eval after extubation  DVT prophylaxis - SCDs SUP - Pepcid Nutrition - tube feeds Goals of care - full code   CC time 35 min   Mcarthur Rossetti. Tyson Alias, MD, FACP Pgr: [phone] [facility]

## 2016-06-17 NOTE — Progress Notes (Signed)
Subjective: Overnight, he developed atrial fibrillation with RVR and amiodarone was added.   This morning, his daughter and wife were at bedside. They were not happy with the lamotrigine and felt it precipitated the events that warranted this admission. In terms of skin injury, they recall he fell and scraped his right arm two days prior to admission.     Antibiotics:  Anti-infectives    Start     Dose/Rate Route Frequency Ordered Stop   06/17/16 2000  vancomycin (VANCOCIN) IVPB 1000 mg/200 mL premix  Status:  Discontinued     1,000 mg 200 mL/hr over 60 Minutes Intravenous Every 48 hours 06/15/16 2035 06/16/16 1353   06/16/16 1500  ceFAZolin (ANCEF) IVPB 2g/100 mL premix     2 g 200 mL/hr over 30 Minutes Intravenous Every 12 hours 06/16/16 1444     06/16/16 0300  piperacillin-tazobactam (ZOSYN) IVPB 2.25 g  Status:  Discontinued     2.25 g 100 mL/hr over 30 Minutes Intravenous Every 6 hours 06/15/16 2035 06/16/16 1444   06/15/16 2130  vancomycin (VANCOCIN) 1,250 mg in sodium chloride 0.9 % 250 mL IVPB     1,250 mg 166.7 mL/hr over 90 Minutes Intravenous  Once 06/15/16 2035 06/15/16 2250   06/15/16 2100  piperacillin-tazobactam (ZOSYN) IVPB 3.375 g     3.375 g 100 mL/hr over 30 Minutes Intravenous  Once 06/15/16 2035 06/15/16 2150      Medications: Scheduled Meds: .  ceFAZolin (ANCEF) IV  2 g Intravenous Q12H  . chlorhexidine gluconate (MEDLINE KIT)  15 mL Mouth Rinse BID  . Chlorhexidine Gluconate Cloth  6 each Topical Daily  . famotidine  20 mg Per Tube Daily  . feeding supplement (PRO-STAT SUGAR FREE 64)  60 mL Per Tube BID  . feeding supplement (VITAL 1.5 CAL)  1,000 mL Per Tube Q24H  . insulin aspart  2-6 Units Subcutaneous Q4H  . levETIRAcetam  500 mg Intravenous Q12H  . mouth rinse  15 mL Mouth Rinse 10 times per day  . sodium chloride flush  10-40 mL Intracatheter Q12H  . sodium chloride flush  3 mL Intravenous Q12H   Continuous Infusions: . amiodarone  30 mg/hr (06/17/16 0525)  . norepinephrine (LEVOPHED) '4mg'$  / 243m infusion 2 mcg/min (06/16/16 0818)   PRN Meds:.fentaNYL (SUBLIMAZE) injection, ipratropium-albuterol, MUSCLE RUB, [DISCONTINUED] ondansetron **OR** ondansetron (ZOFRAN) IV, sodium chloride flush    Objective: Weight change: -3.5 oz (-0.1 kg)  Intake/Output Summary (Last 24 hours) at 06/17/16 1118 Last data filed at 06/17/16 0600  Gross per 24 hour  Intake          1874.89 ml  Output             1495 ml  Net           379.89 ml   Blood pressure (!) 91/52, pulse (!) 130, temperature 98.2 F (36.8 C), temperature source Axillary, resp. rate (!) 29, height '5\' 4"'$  (1.626 m), weight 145 lb 15.1 oz (66.2 kg), SpO2 96 %. Temp:  [98.1 F (36.7 C)-99.4 F (37.4 C)] 98.2 F (36.8 C) (03/12 0741) Pulse Rate:  [94-137] 130 (03/12 0741) Resp:  [13-29] 29 (03/12 0900) BP: (87-129)/(52-111) 91/52 (03/12 0900) SpO2:  [94 %-100 %] 96 % (03/12 0900) FiO2 (%):  [40 %] 40 % (03/12 0900) Weight:  [145 lb 15.1 oz (66.2 kg)] 145 lb 15.1 oz (66.2 kg) (03/12 0404)  Physical Exam: General: elderly, ill-appearing man HEENT: sluggish pupillary response  to light, no scleral icterus, ETT in place, L IJ in place Cardiac: irregularly irregular Pulm: coarse breath sounds on anterior ausculation Abd: soft, nontender, nondistended, BS present Ext: cool to touch with mottled appearance of the feet Neuro: sedated, does not react to sternal rub, toes mute  CBC    Component Value Date/Time   WBC 17.2 (H) 06/17/2016 0401   RBC 4.92 06/17/2016 0401   HGB 16.6 06/17/2016 0401   HCT 48.7 06/17/2016 0401   PLT 46 (L) 06/17/2016 0401   MCV 99.0 06/17/2016 0401   MCH 33.7 06/17/2016 0401   MCHC 34.1 06/17/2016 0401   RDW 16.9 (H) 06/17/2016 0401   LYMPHSABS 2.1 06/18/2016 2111   MONOABS 1.0 06/20/2016 2111   EOSABS 0.0 06/18/2016 2111   BASOSABS 0.0 06/20/2016 2111     BMET  Recent Labs  06/16/16 0542 06/17/16 0401  NA 135 139  K 2.8*  3.5  CL 92* 96*  CO2 29 27  GLUCOSE 155* 145*  BUN 87* 113*  CREATININE 2.51* 2.69*  CALCIUM 7.7* 8.1*     Liver Panel   Recent Labs  06/16/16 0542 06/17/16 0401  PROT 4.8* 6.0*  ALBUMIN 2.5* 2.9*  AST 75* 52*  ALT 324* 200*  ALKPHOS 76 94  BILITOT 3.9* 3.5*   Micro Results: Recent Results (from the past 720 hour(s))  MRSA PCR Screening     Status: None   Collection Time: 06/12/16  2:00 PM  Result Value Ref Range Status   MRSA by PCR NEGATIVE NEGATIVE Final    Comment:        The GeneXpert MRSA Assay (FDA approved for NASAL specimens only), is one component of a comprehensive MRSA colonization surveillance program. It is not intended to diagnose MRSA infection nor to guide or monitor treatment for MRSA infections.   Culture, Urine     Status: None   Collection Time: 06/15/16  5:27 PM  Result Value Ref Range Status   Specimen Description URINE, RANDOM  Final   Special Requests Immunocompromised  Final   Culture NO GROWTH  Final   Report Status 06/16/2016 FINAL  Final  Culture, respiratory (NON-Expectorated)     Status: None (Preliminary result)   Collection Time: 06/15/16  5:27 PM  Result Value Ref Range Status   Specimen Description TRACHEAL ASPIRATE  Final   Special Requests NONE  Final   Gram Stain   Final    ABUNDANT WBC PRESENT, PREDOMINANTLY PMN RARE SQUAMOUS EPITHELIAL CELLS PRESENT ABUNDANT GRAM POSITIVE COCCI IN PAIRS IN CLUSTERS MODERATE GRAM NEGATIVE RODS RARE GRAM POSITIVE RODS    Culture   Final    ABUNDANT STAPHYLOCOCCUS AUREUS CULTURE REINCUBATED FOR BETTER GROWTH    Report Status PENDING  Incomplete   Organism ID, Bacteria STAPHYLOCOCCUS AUREUS  Final      Susceptibility   Staphylococcus aureus - MIC*    CIPROFLOXACIN <=0.5 SENSITIVE Sensitive     ERYTHROMYCIN <=0.25 SENSITIVE Sensitive     GENTAMICIN <=0.5 SENSITIVE Sensitive     OXACILLIN 0.5 SENSITIVE Sensitive     TETRACYCLINE <=1 SENSITIVE Sensitive     VANCOMYCIN 1  SENSITIVE Sensitive     TRIMETH/SULFA <=10 SENSITIVE Sensitive     CLINDAMYCIN <=0.25 SENSITIVE Sensitive     RIFAMPIN <=0.5 SENSITIVE Sensitive     Inducible Clindamycin NEGATIVE Sensitive     * ABUNDANT STAPHYLOCOCCUS AUREUS  Culture, blood (routine x 2)     Status: None (Preliminary result)   Collection Time: 06/15/16  6:32  PM  Result Value Ref Range Status   Specimen Description BLOOD RIGHT ARM  Final   Special Requests BOTTLES DRAWN AEROBIC ONLY 8CC  Final   Culture  Setup Time   Final    GRAM POSITIVE COCCI IN CLUSTERS AEROBIC BOTTLE ONLY CRITICAL VALUE NOTED.  VALUE IS CONSISTENT WITH PREVIOUSLY REPORTED AND CALLED VALUE.    Culture GRAM POSITIVE COCCI  Final   Report Status PENDING  Incomplete  Culture, blood (routine x 2)     Status: Abnormal (Preliminary result)   Collection Time: 06/15/16  6:45 PM  Result Value Ref Range Status   Specimen Description BLOOD LEFT HAND  Final   Special Requests BOTTLES DRAWN AEROBIC ONLY 5CC  Final   Culture  Setup Time   Final    GRAM POSITIVE COCCI IN CLUSTERS AEROBIC BOTTLE ONLY CRITICAL RESULT CALLED TO, READ BACK BY AND VERIFIED WITH: C BALL,PHARMD AT 1157 06/16/16 BY L BENFIELD    Culture STAPHYLOCOCCUS AUREUS (A)  Final   Report Status PENDING  Incomplete  Blood Culture ID Panel (Reflexed)     Status: Abnormal   Collection Time: 06/15/16  6:45 PM  Result Value Ref Range Status   Enterococcus species NOT DETECTED NOT DETECTED Final   Listeria monocytogenes NOT DETECTED NOT DETECTED Final   Staphylococcus species DETECTED (A) NOT DETECTED Final    Comment: CRITICAL RESULT CALLED TO, READ BACK BY AND VERIFIED WITH: C BALL,PHARMD AT 1157 06/16/16 BY L BENFIELD    Staphylococcus aureus DETECTED (A) NOT DETECTED Final    Comment: Methicillin (oxacillin) susceptible Staphylococcus aureus (MSSA). Preferred therapy is anti staphylococcal beta lactam antibiotic (Cefazolin or Nafcillin), unless clinically contraindicated. CRITICAL RESULT  CALLED TO, READ BACK BY AND VERIFIED WITH: C BALL,PHARMD AT 1157 06/16/16 BY L BENFIELD    Methicillin resistance NOT DETECTED NOT DETECTED Final   Streptococcus species NOT DETECTED NOT DETECTED Final   Streptococcus agalactiae NOT DETECTED NOT DETECTED Final   Streptococcus pneumoniae NOT DETECTED NOT DETECTED Final   Streptococcus pyogenes NOT DETECTED NOT DETECTED Final   Acinetobacter baumannii NOT DETECTED NOT DETECTED Final   Enterobacteriaceae species NOT DETECTED NOT DETECTED Final   Enterobacter cloacae complex NOT DETECTED NOT DETECTED Final   Escherichia coli NOT DETECTED NOT DETECTED Final   Klebsiella oxytoca NOT DETECTED NOT DETECTED Final   Klebsiella pneumoniae NOT DETECTED NOT DETECTED Final   Proteus species NOT DETECTED NOT DETECTED Final   Serratia marcescens NOT DETECTED NOT DETECTED Final   Haemophilus influenzae NOT DETECTED NOT DETECTED Final   Neisseria meningitidis NOT DETECTED NOT DETECTED Final   Pseudomonas aeruginosa NOT DETECTED NOT DETECTED Final   Candida albicans NOT DETECTED NOT DETECTED Final   Candida glabrata NOT DETECTED NOT DETECTED Final   Candida krusei NOT DETECTED NOT DETECTED Final   Candida parapsilosis NOT DETECTED NOT DETECTED Final   Candida tropicalis NOT DETECTED NOT DETECTED Final    Studies/Results: Dg Chest Port 1 View  Result Date: 06/17/2016 CLINICAL DATA:  Respiratory failure, COPD, CHF, atrial fibrillation, former heavy smoker. EXAM: PORTABLE CHEST 1 VIEW COMPARISON:  Portable chest x-ray of March 11th 2018 FINDINGS: The lungs are adequately inflated. There is no focal infiltrate, pneumothorax, or significant pleural effusion. There is persistent subsegmental atelectasis at both lung bases. The cardiac silhouette is enlarged but stable. The central pulmonary vascularity is mildly prominent. Of the endotracheal tube tip lies 4.6 cm above the carina. The feeding tube tip projects below the inferior margin of the image. The ICD  is in  stable position. The left internal jugular venous catheter tip projects over the proximal SVC. A pigtail catheter projects just inferior to the aortic arch and is stable. IMPRESSION: Improving aeration of both lungs with decreased interstitial edema. Persistent subsegmental atelectasis in the right infrahilar region. No pneumothorax or significant pleural effusion. The support devices are in reasonable position. Cardiomegaly.  Thoracic aortic atherosclerosis. Electronically Signed   By: David  Martinique M.D.   On: 06/17/2016 07:30   Dg Chest Port 1 View  Result Date: 06/16/2016 CLINICAL DATA:  82 year old male with hyponatremia, falls. Intubated with subsequent left pneumothorax, treated with left chest tube. EXAM: PORTABLE CHEST 1 VIEW COMPARISON:  06/15/2016 and earlier. FINDINGS: Portable AP semi upright view at 0459 hours. Stable left chest tube. No pneumothorax visible today. Endotracheal tube, visible enteric tube, and left IJ central line are stable. Stable left chest cardiac AICD. Stable cardiomegaly and mediastinal contours. Stable lung volumes and ventilation aside from decreased veiling opacity at the right lung base. IMPRESSION: 1.  Stable lines and tubes.  No left pneumothorax visible today. 2. Improved right lung base ventilation. No new cardiopulmonary abnormality. Electronically Signed   By: Genevie Ann M.D.   On: 06/16/2016 07:49    Assessment/Plan:  Principal Problem:   Adjustment disorder with mixed anxiety and depressed mood Active Problems:   Permanent atrial fibrillation ([place])   Fall   S/P ICD (internal cardiac defibrillator) procedure, 08/17/12, [employer] implanted   Diabetes type 2, uncontrolled ([place])   HLD (hyperlipidemia)   Systolic CHF, chronic ([facility])   Hyponatremia   Suicidal ideation   Abnormal LFTs   ATN (acute tubular necrosis) ([facility])   CHF (congestive heart failure) ([place])   Acute encephalopathy  [name] is a 82 y.o. male with ischemic cardiomyopathy s/p  ICD, DM2, seizure disorder hospitalized for hyponatremia and suicidal ideation who then developed cardiogenic shock, MSSA septic shock, ventilatory drive respiratory failure complicated by left-sided tension pnemothorax, acute encephalopathy, and now atrial-fibrillation and RVR.   MSSA septic shock: MSSA noted on tracheal aspirate and blood cultures, the former of which would be consistent with pneumonia though imaging does not necessarily support. -Continue cefazolin [Abx Day 3].  -Repeat blood cultures today -Recommend discontinuation of left IJ catheter -Consider TEE to assess ICD if he recovers from his current state of health.  Atrial-fibrillation with RVR: HR 110s-130s this morning on amiodarone.   Cardiogenic shock: Echo 06/10/16 EF 15-20%. Currently on norepinephrine, vasopressin. Heart Failure assesses poor perfusion may be more septic shock given stable co-ox and improved CVP.  Acute encephalopathy: EEG with focal slowing of the left frontal region and moderate diffuse slowing of the background which is more consistent with systemic illness. He is on Keppra for seizure disorder -Consider neuroimaging to reassess  Overall, he has a guarded prognosis and goals of care discussion would be helpful to direct his care plan.     LOS: 8 days   Charlott Rakes 06/17/2016, 11:18 AM

## 2016-06-17 NOTE — Progress Notes (Signed)
Pt temp now 98.5 rectally. Pt remains on cooling blanket.

## 2016-06-17 NOTE — Progress Notes (Signed)
Spoke with ELink MD regarding running quad strength levophed thru a PIV, was told verbally it is okay to run PIV at low doses. Will continue to monitor.

## 2016-06-18 ENCOUNTER — Inpatient Hospital Stay (HOSPITAL_COMMUNITY): Payer: Medicare HMO

## 2016-06-18 DIAGNOSIS — R197 Diarrhea, unspecified: Secondary | ICD-10-CM

## 2016-06-18 DIAGNOSIS — S0990XA Unspecified injury of head, initial encounter: Secondary | ICD-10-CM

## 2016-06-18 DIAGNOSIS — R45851 Suicidal ideations: Secondary | ICD-10-CM

## 2016-06-18 DIAGNOSIS — R600 Localized edema: Secondary | ICD-10-CM

## 2016-06-18 DIAGNOSIS — W19XXXA Unspecified fall, initial encounter: Secondary | ICD-10-CM

## 2016-06-18 LAB — GLUCOSE, CAPILLARY
GLUCOSE-CAPILLARY: 198 mg/dL — AB (ref 65–99)
GLUCOSE-CAPILLARY: 153 mg/dL — AB (ref 65–99)
Glucose-Capillary: 186 mg/dL — ABNORMAL HIGH (ref 65–99)
Glucose-Capillary: 178 mg/dL — ABNORMAL HIGH (ref 65–99)
Glucose-Capillary: 171 mg/dL — ABNORMAL HIGH (ref 65–99)
Glucose-Capillary: 194 mg/dL — ABNORMAL HIGH (ref 65–99)

## 2016-06-18 LAB — C DIFFICILE QUICK SCREEN W PCR REFLEX
C DIFFICILE (CDIFF) INTERP: NOT DETECTED
C DIFFICILE (CDIFF) TOXIN: NEGATIVE
C Diff antigen: NEGATIVE

## 2016-06-18 LAB — COMPREHENSIVE METABOLIC PANEL
ALK PHOS: 109 U/L (ref 38–126)
ALT: 53 U/L (ref 17–63)
AST: 244 U/L — AB (ref 15–41)
Albumin: 2.8 g/dL — ABNORMAL LOW (ref 3.5–5.0)
Anion gap: 24 — ABNORMAL HIGH (ref 5–15)
BILIRUBIN TOTAL: 4.5 mg/dL — AB (ref 0.3–1.2)
BUN: 149 mg/dL — AB (ref 6–20)
CHLORIDE: 95 mmol/L — AB (ref 101–111)
CO2: 20 mmol/L — ABNORMAL LOW (ref 22–32)
CREATININE: 3.62 mg/dL — AB (ref 0.61–1.24)
Calcium: 8.5 mg/dL — ABNORMAL LOW (ref 8.9–10.3)
GFR calc Af Amer: 17 mL/min — ABNORMAL LOW (ref >60)
GFR, EST NON AFRICAN AMERICAN: 15 mL/min — AB (ref >60)
Glucose, Bld: 153 mg/dL — ABNORMAL HIGH (ref 65–99)
Potassium: 4.4 mmol/L (ref 3.5–5.1)
Sodium: 139 mmol/L (ref 135–145)
Total Protein: 5.9 g/dL — ABNORMAL LOW (ref 6.5–8.1)

## 2016-06-18 LAB — CBC
HCT: 50.4 % (ref 39.0–52.0)
Hemoglobin: 17.2 g/dL — ABNORMAL HIGH (ref 13.0–17.0)
MCH: 33.5 pg (ref 26.0–34.0)
MCHC: 34.1 g/dL (ref 30.0–36.0)
MCV: 98.1 fL (ref 78.0–100.0)
PLATELETS: 50 10*3/uL — AB (ref 150–400)
RBC: 5.14 MIL/uL (ref 4.22–5.81)
RDW: 17.5 % — AB (ref 11.5–15.5)
WBC: 33.1 10*3/uL — AB (ref 4.0–10.5)

## 2016-06-18 LAB — CULTURE, BLOOD (ROUTINE X 2)

## 2016-06-18 MED ORDER — NOREPINEPHRINE BITARTRATE 1 MG/ML IV SOLN
0.0000 ug/min | INTRAVENOUS | Status: DC
  Administered 2016-06-18: 9 ug/min via INTRAVENOUS
  Administered 2016-06-18: 11 ug/min via INTRAVENOUS
  Administered 2016-06-19: 6 ug/min via INTRAVENOUS
  Filled 2016-06-18 (×4): qty 4

## 2016-06-18 NOTE — Progress Notes (Signed)
Advanced Heart Failure Rounding Note   Subjective:    3/8 Intubated, then developed left PTX thought to be due to positive pressure ventilation. CT placed.   Found to have MSSA in sputum and blood with septic shock.   Remains on levophed at 12 mcg.  PCT > 6.80. 06/17/16. Probable RLL PNA. Now on Ancef alone.    Off sedation since 8 am 06/15/16. Responsiveness has gradually decreased. No longer responding to pain. Has had no purposeful movement since sedation weaned.   Developed septic shock 06/17/16 with Tmax 104.8.   Remains febrile this am at 101.4. Up to 102 rectally.  AF rate remains up into 120s. On amiodarone 30 mg/hr. Central line removed with sepsis.   EEG: With diffuse slowing and focal left cerebral dysfunction   Creatinine 2.24> 3.4>3.72>3.57 > 3.09 > 2.5 > 2.69 > 3.62.  BUN up to 149. Oliguric (130cc) despite IV lasix yesterday.  Objective:   Weight Range:  Vital Signs:   Temp:  [98.5 F (36.9 C)-104.8 F (40.4 C)] 101.4 F (38.6 C) (03/13 0735) Pulse Rate:  [106-152] 124 (03/13 0743) Resp:  [20-32] 27 (03/13 0743) BP: (63-134)/(48-93) 93/75 (03/13 0743) SpO2:  [92 %-100 %] 96 % (03/13 0743) FiO2 (%):  [40 %] 40 % (03/13 0743) Weight:  [146 lb 9.7 oz (66.5 kg)] 146 lb 9.7 oz (66.5 kg) (03/13 0332) Last BM Date: 06/18/16  Weight change: Filed Weights   06/16/16 0345 06/17/16 0404 06/18/16 0332  Weight: 146 lb 2.6 oz (66.3 kg) 145 lb 15.1 oz (66.2 kg) 146 lb 9.7 oz (66.5 kg)    Intake/Output:   Intake/Output Summary (Last 24 hours) at 06/18/16 0814 Last data filed at 06/18/16 0700  Gross per 24 hour  Intake          2076.46 ml  Output              390 ml  Net          1686.46 ml     Physical Exam: General:  Intubated, unresponsive.    HEENT: Normal Neck: supple. JVP elevated to jaw with prominent cv waves. Carotids 2+ bilat; no bruits. No thyromegaly or nodule noted. LIJ  Cor: PMI nondisplaced. IRR. Tachy.  Lungs: Rhonchi throughout.  Abdomen:  soft, ND, no HSM. No bruits or masses. +BS   Extremities: No clubbing, rash, edema. R and LLE SCDs. Diffuse echymosis. Legs cold and mottled. Neuro: Intubated, unresponsive.   Telemetry: Personally reviewed, Afib 120s   Labs: Basic Metabolic Panel:  Recent Labs Lab 06/14/16 0352 06/14/16 0951 06/14/16 1700 06/15/16 0454 06/15/16 1727 06/16/16 0542 06/17/16 0401  NA 127*  --  128* 131* 135 135 139  K 4.6  --  3.5 3.3* 3.2* 2.8* 3.5  CL 90*  --  87* 87* 89* 92* 96*  CO2 25  --  '27 29 31 29 27  '$ GLUCOSE 104*  --  129* 119* 182* 155* 145*  BUN 77*  --  76* 82* 88* 87* 113*  CREATININE 3.57*  --  3.23* 3.09* 2.71* 2.51* 2.69*  CALCIUM 9.1  --  8.9 8.7* 8.8* 7.7* 8.1*  MG 3.0* 2.8* 2.6* 2.3 2.0  --   --   PHOS 5.7* 5.1* 4.1 3.6 2.3*  --  2.2*    Liver Function Tests:  Recent Labs Lab 06/13/16 0833 06/14/16 0352 06/15/16 0454 06/16/16 0542 06/17/16 0401  AST 2,378* 794* 227* 75* 52*  ALT 1,567* 1,057* 633* 324* 200*  ALKPHOS  96 85 83 76 94  BILITOT 3.5* 3.3* 4.0* 3.9* 3.5*  PROT 6.2* 5.7* 5.4* 4.8* 6.0*  ALBUMIN 4.0 3.5 3.2* 2.5* 2.9*   No results for input(s): LIPASE, AMYLASE in the last 168 hours.  Recent Labs Lab 06/12/16 1342 06/13/16 1010  AMMONIA 45* 38*    CBC:  Recent Labs Lab 06/14/16 0352 06/15/16 0454 06/16/16 0542 06/17/16 0401 06/18/16 0634  WBC 8.2 9.0 7.9 17.2* 33.1*  HGB 13.8 14.1 14.1 16.6 17.2*  HCT 39.7 41.6 42.2 48.7 50.4  MCV 96.6 97.7 97.9 99.0 98.1  PLT 69* 50* 34* 46* PENDING    Cardiac Enzymes:  Recent Labs Lab 06/12/16 1342 06/12/16 1530 06/13/16 1010 06/13/16 1600 06/13/16 2157  CKTOTAL  --  38*  --   --   --   TROPONINI 0.38*  --  0.25* 0.26* 0.22*    BNP: BNP (last 3 results)  Recent Labs  05/30/16 1336 06/10/2016 2111 06/12/16 1343  BNP 1,104.3* 1,879.0* 2,370.0*    ProBNP (last 3 results) No results for input(s): PROBNP in the last 8760 hours.    Other results:  Imaging: Dg Chest Port 1  View  Result Date: 06/18/2016 CLINICAL DATA:  Acute respiratory failure, shortness of breath, intubated patient. EXAM: PORTABLE CHEST 1 VIEW COMPARISON:  Portable chest x-ray dated June 17, 2016 FINDINGS: The lungs are well-expanded. There is increasing density in the right infrahilar region. The retrocardiac region on the left is more dense. There are probable small bilateral pleural effusions. No recurrent left-sided pneumothorax is observed. The small caliber chest tube is in stable position. The cardiac silhouette is top-normal in size. The pulmonary vascularity is not clearly engorged. The ICD is in stable position. The endotracheal tube tip measures 4.2 cm above the carina. The feeding tube tip projects below the inferior margin of the image. IMPRESSION: Increasing bibasilar density greatest on the right worrisome for atelectasis or pneumonia. There is no pneumothorax nor significant pleural effusion. The support tubes are in stable position. Electronically Signed   By: David  Martinique M.D.   On: 06/18/2016 07:08   Dg Chest Port 1 View  Result Date: 06/17/2016 CLINICAL DATA:  Respiratory failure, COPD, CHF, atrial fibrillation, former heavy smoker. EXAM: PORTABLE CHEST 1 VIEW COMPARISON:  Portable chest x-ray of March 11th 2018 FINDINGS: The lungs are adequately inflated. There is no focal infiltrate, pneumothorax, or significant pleural effusion. There is persistent subsegmental atelectasis at both lung bases. The cardiac silhouette is enlarged but stable. The central pulmonary vascularity is mildly prominent. Of the endotracheal tube tip lies 4.6 cm above the carina. The feeding tube tip projects below the inferior margin of the image. The ICD is in stable position. The left internal jugular venous catheter tip projects over the proximal SVC. A pigtail catheter projects just inferior to the aortic arch and is stable. IMPRESSION: Improving aeration of both lungs with decreased interstitial edema.  Persistent subsegmental atelectasis in the right infrahilar region. No pneumothorax or significant pleural effusion. The support devices are in reasonable position. Cardiomegaly.  Thoracic aortic atherosclerosis. Electronically Signed   By: David  Martinique M.D.   On: 06/17/2016 07:30     Medications:     Scheduled Medications: .  ceFAZolin (ANCEF) IV  2 g Intravenous Q12H  . chlorhexidine gluconate (MEDLINE KIT)  15 mL Mouth Rinse BID  . Chlorhexidine Gluconate Cloth  6 each Topical Daily  . famotidine  20 mg Per Tube Daily  . feeding supplement (PRO-STAT SUGAR FREE 64)  60 mL Per Tube BID  . feeding supplement (VITAL 1.5 CAL)  1,000 mL Per Tube Q24H  . insulin aspart  2-6 Units Subcutaneous Q4H  . levETIRAcetam  500 mg Intravenous Q12H  . mouth rinse  15 mL Mouth Rinse 10 times per day  . sodium chloride flush  10-40 mL Intracatheter Q12H  . sodium chloride flush  3 mL Intravenous Q12H    Infusions: . amiodarone 30 mg/hr (06/18/16 0700)  . norepinephrine (LEVOPHED) '4mg'$  / 265m infusion 12 mcg/min (06/18/16 0700)    PRN Medications: fentaNYL (SUBLIMAZE) injection, ipratropium-albuterol, MUSCLE RUB, [DISCONTINUED] ondansetron **OR** ondansetron (ZOFRAN) IV, sodium chloride flush   Assessment/Plan/Discussin    1.  A/C Biventricular Heart Failure -> cardiogenic shock - echo 3/5 EF 15-20% RV moderately HK. ICM. (EF similar to past) - Now with septic shock.  - Remains on levophed.  - Volume status elevated. Now oliguiric. BUN 149 and creatinine climbing with worsening shock.  Not CVVHD candidate with current condition.  - AST worse again. Creatinine back up significantly.    - No BB blocker or ACE/ARB with shock/AKI 2. Acute respiratory failure and tension PTX - remains intubated - s/p chest tube - now with MSSA HCAP. Vanc/zosyn started 3/10.  D/w Dr. [place] vent. CCM following. Tolerating SBT but remains unresponsive - No change to current. 3. MSSA  septic shock/HCAP - MSSA in sputum and blood. Vanc/zosyn started 3/10. Narrowed to Ancef 06/16/16 - Continue vasopressor support. Now off dobutamine, remains on norepinephrine.  - He has ICD in place. If recovers neurologically will need TEE to look at ICD. - WBC 7 -> 17 -> 33.1. PCT up to > 6. Remains on Ancef alone per ID.  4. AKI on CKD - Likely ATN due to hypotension/shock. Cardiorenal syndrome - Nephrology following. Creatinine and BUN worsening. Unlikely candidate for CVVHD.   -Off lasix in setting of sepsis.  5. Anoxic brain injury -- Admitted after fall. CT of head negative for bleed.  -- Results of EEG reviewed. Will continue to follow off sedation. Prognosis remains poor.  -- No change to current plan.  6. Chronic A fib -- Rate up in setting of sepsis. Unable to use b-blocker with low BP/shock.  -- Amio gtt for rate control.   -- Not anticoagulated due to falls. SCDs in place.  -- No change to current plan.  7. CAD with ICM -- On ASA 81.  No statin with markedly elevated LFTs.  -- No recent cath. Myoview 2/14. Extensive scar LAD and RCA territories. Not gated due to AF 8. Shock liver - GI has seen. [place]d/Kidney completed. No acute findings. Likely due to shock.  9. Hypokalemia - Continue supp.   10. Loose stools - ? CDiff with high WBC and high fever.   Pt critically ill and worsening.  Suspect prognosis extremely poor in short term.  Will discuss with family when they arrive to room.  Pt wife currently being treated in hospital after fall 06/17/16.  Length of Stay: [place] 06/18/2016, 8:14 AM  Advanced Heart Failure Team Pager [phone](M-F; 7a - 4p)  Please contact [facility] for night-coverage after hours (4p -7a ) and weekends on [email or web address]  Patient seen with PA, agree with the above note.  Severe sepsis with shock from MSSA.  He remains febrile with elevated WBCs. Now with worsening renal failure and oliguria.  Suspect anoxic brain  injury with no responsiveness off sedation.  Very poor prognosis.  Would continue current level of support for now, not HD candidate.  Will need family meeting for goals of care as comfort care would be appropriate at this point.   [name] 06/18/2016 10:31 AM

## 2016-06-18 NOTE — Progress Notes (Signed)
Patient ID: [name], male   DOB: 28-Jan-1935, 82 y.o.   MRN: [id number]  [facility] Progress Note    Assessment/ Plan:   1. AKI on chronic kidney disease stage III: Suspected to be ATN associated with sepsis/hemodynamically mediated with CHF decompensation. Meager UOP with worsening renal function overnight. Based on comorbidities and current mental compromise, NOT a candidate for any forms of RRT. Would recommend palliative/comfort care approach at this point as discussed with CCM and cardiology services who concur. 2. Acute hypoxic respiratory failure with left pneumothorax status post chest tube-continue ventilator support per CCM 3. MSSA bacteremia/sepsis: Suspected to be associated with pacer wire infection/endocarditis-on Ancef 4. Anoxic brain injury status post fall/seizure  Subjective:   No acute events overnight, having diarrhea with stool sample en route for C difficile testing   Objective:   BP 108/69 (BP Location: Left Arm)   Pulse (!) 119   Temp (!) 101.4 F (38.6 C) (Oral)   Resp (!) 26   Ht _0  (1.626 m)   Wt 66.5 kg (146 lb 9.7 oz)   SpO2 96%   BMI 25.16 kg/m   Intake/Output Summary (Last 24 hours) at 06/18/16 1026 Last data filed at 06/18/16 0800  Gross per 24 hour  Intake          1984.66 ml  Output              390 ml  Net          1594.66 ml   Weight change: 0.3 kg (10.6 oz)  Physical Exam: SAY:[id number], unresponsive CVS: Irregularly Irregular tachycardia Resp: Coarse breath sounds bilaterally, left chest tube Abd: Soft, obese, nontender Ext: Trace ankle edema. Dusky toes and fingertips  Imaging: Dg Chest Port 1 View  Result Date: 06/18/2016 CLINICAL DATA:  Acute respiratory failure, shortness of breath, intubated patient. EXAM: PORTABLE CHEST 1 VIEW COMPARISON:  Portable chest x-ray dated June 17, 2016 FINDINGS: The lungs are well-expanded. There is increasing density in the right infrahilar region. The retrocardiac region on  the left is more dense. There are probable small bilateral pleural effusions. No recurrent left-sided pneumothorax is observed. The small caliber chest tube is in stable position. The cardiac silhouette is top-normal in size. The pulmonary vascularity is not clearly engorged. The ICD is in stable position. The endotracheal tube tip measures 4.2 cm above the carina. The feeding tube tip projects below the inferior margin of the image. IMPRESSION: Increasing bibasilar density greatest on the right worrisome for atelectasis or pneumonia. There is no pneumothorax nor significant pleural effusion. The support tubes are in stable position. Electronically Signed   By: David  Martinique M.D.   On: 06/18/2016 07:08   Dg Chest Port 1 View  Result Date: 06/17/2016 CLINICAL DATA:  Respiratory failure, COPD, CHF, atrial fibrillation, former heavy smoker. EXAM: PORTABLE CHEST 1 VIEW COMPARISON:  Portable chest x-ray of March 11th 2018 FINDINGS: The lungs are adequately inflated. There is no focal infiltrate, pneumothorax, or significant pleural effusion. There is persistent subsegmental atelectasis at both lung bases. The cardiac silhouette is enlarged but stable. The central pulmonary vascularity is mildly prominent. Of the endotracheal tube tip lies 4.6 cm above the carina. The feeding tube tip projects below the inferior margin of the image. The ICD is in stable position. The left internal jugular venous catheter tip projects over the proximal SVC. A pigtail catheter projects just inferior to the aortic arch and is stable. IMPRESSION: Improving aeration of both lungs with  decreased interstitial edema. Persistent subsegmental atelectasis in the right infrahilar region. No pneumothorax or significant pleural effusion. The support devices are in reasonable position. Cardiomegaly.  Thoracic aortic atherosclerosis. Electronically Signed   By: David  Martinique M.D.   On: 06/17/2016 07:30    Labs: BMET  Recent Labs Lab  06/14/16 0352 06/14/16 0951 06/14/16 1700 06/15/16 0454 06/15/16 1727 06/16/16 0542 06/17/16 0401 06/18/16 0634  NA 127*  --  128* 131* 135 135 139 139  K 4.6  --  3.5 3.3* 3.2* 2.8* 3.5 4.4  CL 90*  --  87* 87* 89* 92* 96* 95*  CO2 25  --  _0 20*  GLUCOSE 104*  --  129* 119* 182* 155* 145* 153*  BUN 77*  --  76* 82* 88* 87* 113* 149*  CREATININE 3.57*  --  3.23* 3.09* 2.71* 2.51* 2.69* 3.62*  CALCIUM 9.1  --  8.9 8.7* 8.8* 7.7* 8.1* 8.5*  PHOS 5.7* 5.1* 4.1 3.6 2.3*  --  2.2*  --    CBC  Recent Labs Lab 06/15/16 0454 06/16/16 0542 06/17/16 0401 06/18/16 0634  WBC 9.0 7.9 17.2* 33.1*  HGB 14.1 14.1 16.6 17.2*  HCT 41.6 42.2 48.7 50.4  MCV 97.7 97.9 99.0 98.1  PLT 50* 34* 46* 50*    Medications:    .  ceFAZolin (ANCEF) IV  2 g Intravenous Q12H  . chlorhexidine gluconate (MEDLINE KIT)  15 mL Mouth Rinse BID  . Chlorhexidine Gluconate Cloth  6 each Topical Daily  . famotidine  20 mg Per Tube Daily  . feeding supplement (PRO-STAT SUGAR FREE 64)  60 mL Per Tube BID  . feeding supplement (VITAL 1.5 CAL)  1,000 mL Per Tube Q24H  . insulin aspart  2-6 Units Subcutaneous Q4H  . levETIRAcetam  500 mg Intravenous Q12H  . mouth rinse  15 mL Mouth Rinse 10 times per day  . sodium chloride flush  10-40 mL Intracatheter Q12H  . sodium chloride flush  3 mL Intravenous Q12H   Elmarie Shiley, MD 06/18/2016, 10:26 AM

## 2016-06-18 NOTE — Progress Notes (Signed)
eLink Physician-Brief Progress Note Patient Name: [name] DOB: [birth year] MRN: [id number]   Date of Service  06/18/2016  HPI/Events of Note  Loose stool  eICU Interventions  flexiseal     Intervention Category Minor Interventions: Routine modifications to care plan (e.g. PRN medications for pain, fever)  Max Fickle 06/18/2016, 12:56 AM

## 2016-06-18 NOTE — Progress Notes (Signed)
Spoke with daughter Claris Che via phone gave her an update on her fathers current state. She plans on coming to bedside this afternoon.  Will continue to monitor.

## 2016-06-18 NOTE — Progress Notes (Signed)
Subjective: Yesterday, he had a fever with T104.8 and was covered with a cooling blanket. Unfortunately, his wife fell from her wheelchair and had to go to the ED for evaluation.   This morning, no family members are at bedside. He does not awaken to stimuli. He also developed diarrhea and was found to have worsening leukocytosis and fever.   Antibiotics:  Anti-infectives    Start     Dose/Rate Route Frequency Ordered Stop   06/17/16 2000  vancomycin (VANCOCIN) IVPB 1000 mg/200 mL premix  Status:  Discontinued     1,000 mg 200 mL/hr over 60 Minutes Intravenous Every 48 hours 06/15/16 2035 06/16/16 1353   06/16/16 1500  ceFAZolin (ANCEF) IVPB 2g/100 mL premix     2 g 200 mL/hr over 30 Minutes Intravenous Every 12 hours 06/16/16 1444     06/16/16 0300  piperacillin-tazobactam (ZOSYN) IVPB 2.25 g  Status:  Discontinued     2.25 g 100 mL/hr over 30 Minutes Intravenous Every 6 hours 06/15/16 2035 06/16/16 1444   06/15/16 2130  vancomycin (VANCOCIN) 1,250 mg in sodium chloride 0.9 % 250 mL IVPB     1,250 mg 166.7 mL/hr over 90 Minutes Intravenous  Once 06/15/16 2035 06/15/16 2250   06/15/16 2100  piperacillin-tazobactam (ZOSYN) IVPB 3.375 g     3.375 g 100 mL/hr over 30 Minutes Intravenous  Once 06/15/16 2035 06/15/16 2150      Medications: Scheduled Meds: .  ceFAZolin (ANCEF) IV  2 g Intravenous Q12H  . chlorhexidine gluconate (MEDLINE KIT)  15 mL Mouth Rinse BID  . Chlorhexidine Gluconate Cloth  6 each Topical Daily  . famotidine  20 mg Per Tube Daily  . feeding supplement (PRO-STAT SUGAR FREE 64)  60 mL Per Tube BID  . feeding supplement (VITAL 1.5 CAL)  1,000 mL Per Tube Q24H  . insulin aspart  2-6 Units Subcutaneous Q4H  . levETIRAcetam  500 mg Intravenous Q12H  . mouth rinse  15 mL Mouth Rinse 10 times per day  . sodium chloride flush  10-40 mL Intracatheter Q12H  . sodium chloride flush  3 mL Intravenous Q12H   Continuous Infusions: . amiodarone 30 mg/hr  (06/18/16 0957)  . norepinephrine (LEVOPHED) '4mg'$  / 242m infusion     PRN Meds:.fentaNYL (SUBLIMAZE) injection, ipratropium-albuterol, MUSCLE RUB, [DISCONTINUED] ondansetron **OR** ondansetron (ZOFRAN) IV, sodium chloride flush    Objective: Weight change: 10.6 oz (0.3 kg)  Intake/Output Summary (Last 24 hours) at 06/18/16 1044 Last data filed at 06/18/16 0800  Gross per 24 hour  Intake          1984.66 ml  Output              390 ml  Net          1594.66 ml   Blood pressure 95/65, pulse (!) 119, temperature (!) 101.4 F (38.6 C), temperature source Oral, resp. rate (!) 25, height '5\' 4"'$  (1.626 m), weight 146 lb 9.7 oz (66.5 kg), SpO2 96 %. Temp:  [98.5 F (36.9 C)-104.8 F (40.4 C)] 101.4 F (38.6 C) (03/13 0735) Pulse Rate:  [106-152] 119 (03/13 1000) Resp:  [20-32] 25 (03/13 1000) BP: (63-134)/(48-93) 95/65 (03/13 1000) SpO2:  [92 %-100 %] 96 % (03/13 1000) FiO2 (%):  [40 %] 40 % (03/13 0743) Weight:  [146 lb 9.7 oz (66.5 kg)] 146 lb 9.7 oz (66.5 kg) (03/13 0332)  Physical Exam: General: elderly, ill-appearing man, resting in bed HEENT: sluggish pupillary response  to light, ETT tube in place, left IJ has been discontinued Cardiac: irregularly irregular Pulm: coarse breath sounds in the anterior lung fields Abd: soft, nontender, nondistended, BS present Ext: cool to touch, dusky appearance of the hands and feet Neuro: does not respond to sternal rub or noxious stimuli   CBC: CBC Latest Ref Rng & Units 06/18/2016 06/17/2016 06/16/2016  WBC 4.0 - 10.5 K/uL 33.1(H) 17.2(H) 7.9  Hemoglobin 13.0 - 17.0 g/dL 17.2(H) 16.6 14.1  Hematocrit 39.0 - 52.0 % 50.4 48.7 42.2  Platelets 150 - 400 K/uL 50(L) 46(L) 34(L)    BMET  Recent Labs  06/17/16 0401 06/18/16 0634  NA 139 139  K 3.5 4.4  CL 96* 95*  CO2 27 20*  GLUCOSE 145* 153*  BUN 113* 149*  CREATININE 2.69* 3.62*  CALCIUM 8.1* 8.5*     Liver Panel   Recent Labs  06/17/16 0401 06/18/16 0634  PROT 6.0* 5.9*   ALBUMIN 2.9* 2.8*  AST 52* 244*  ALT 200* 53  ALKPHOS 94 109  BILITOT 3.5* 4.5*    Micro Results: Recent Results (from the past 720 hour(s))  MRSA PCR Screening     Status: None   Collection Time: 06/12/16  2:00 PM  Result Value Ref Range Status   MRSA by PCR NEGATIVE NEGATIVE Final    Comment:        The GeneXpert MRSA Assay (FDA approved for NASAL specimens only), is one component of a comprehensive MRSA colonization surveillance program. It is not intended to diagnose MRSA infection nor to guide or monitor treatment for MRSA infections.   Culture, Urine     Status: None   Collection Time: 06/15/16  5:27 PM  Result Value Ref Range Status   Specimen Description URINE, RANDOM  Final   Special Requests Immunocompromised  Final   Culture NO GROWTH  Final   Report Status 06/16/2016 FINAL  Final  Culture, respiratory (NON-Expectorated)     Status: None (Preliminary result)   Collection Time: 06/15/16  5:27 PM  Result Value Ref Range Status   Specimen Description TRACHEAL ASPIRATE  Final   Special Requests NONE  Final   Gram Stain   Final    ABUNDANT WBC PRESENT, PREDOMINANTLY PMN RARE SQUAMOUS EPITHELIAL CELLS PRESENT ABUNDANT GRAM POSITIVE COCCI IN PAIRS IN CLUSTERS MODERATE GRAM NEGATIVE RODS RARE GRAM POSITIVE RODS    Culture   Final    ABUNDANT STAPHYLOCOCCUS AUREUS CULTURE REINCUBATED FOR BETTER GROWTH    Report Status PENDING  Incomplete   Organism ID, Bacteria STAPHYLOCOCCUS AUREUS  Final      Susceptibility   Staphylococcus aureus - MIC*    CIPROFLOXACIN <=0.5 SENSITIVE Sensitive     ERYTHROMYCIN <=0.25 SENSITIVE Sensitive     GENTAMICIN <=0.5 SENSITIVE Sensitive     OXACILLIN 0.5 SENSITIVE Sensitive     TETRACYCLINE <=1 SENSITIVE Sensitive     VANCOMYCIN 1 SENSITIVE Sensitive     TRIMETH/SULFA <=10 SENSITIVE Sensitive     CLINDAMYCIN <=0.25 SENSITIVE Sensitive     RIFAMPIN <=0.5 SENSITIVE Sensitive     Inducible Clindamycin NEGATIVE Sensitive     *  ABUNDANT STAPHYLOCOCCUS AUREUS  Culture, blood (routine x 2)     Status: Abnormal   Collection Time: 06/15/16  6:32 PM  Result Value Ref Range Status   Specimen Description BLOOD RIGHT ARM  Final   Special Requests BOTTLES DRAWN AEROBIC ONLY 8CC  Final   Culture  Setup Time   Final    GRAM POSITIVE  COCCI IN CLUSTERS AEROBIC BOTTLE ONLY CRITICAL VALUE NOTED.  VALUE IS CONSISTENT WITH PREVIOUSLY REPORTED AND CALLED VALUE.    Culture (A)  Final    STAPHYLOCOCCUS AUREUS SUSCEPTIBILITIES PERFORMED ON PREVIOUS CULTURE WITHIN THE LAST 5 DAYS.    Report Status 06/18/2016 FINAL  Final  Culture, blood (routine x 2)     Status: Abnormal   Collection Time: 06/15/16  6:45 PM  Result Value Ref Range Status   Specimen Description BLOOD LEFT HAND  Final   Special Requests BOTTLES DRAWN AEROBIC ONLY 5CC  Final   Culture  Setup Time   Final    GRAM POSITIVE COCCI IN CLUSTERS AEROBIC BOTTLE ONLY CRITICAL RESULT CALLED TO, READ BACK BY AND VERIFIED WITH: C BALL,PHARMD AT 1157 06/16/16 BY L BENFIELD    Culture STAPHYLOCOCCUS AUREUS (A)  Final   Report Status 06/18/2016 FINAL  Final   Organism ID, Bacteria STAPHYLOCOCCUS AUREUS  Final      Susceptibility   Staphylococcus aureus - MIC*    CIPROFLOXACIN <=0.5 SENSITIVE Sensitive     ERYTHROMYCIN <=0.25 SENSITIVE Sensitive     GENTAMICIN <=0.5 SENSITIVE Sensitive     OXACILLIN 0.5 SENSITIVE Sensitive     TETRACYCLINE <=1 SENSITIVE Sensitive     VANCOMYCIN 1 SENSITIVE Sensitive     TRIMETH/SULFA <=10 SENSITIVE Sensitive     CLINDAMYCIN <=0.25 SENSITIVE Sensitive     RIFAMPIN <=0.5 SENSITIVE Sensitive     Inducible Clindamycin NEGATIVE Sensitive     * STAPHYLOCOCCUS AUREUS  Blood Culture ID Panel (Reflexed)     Status: Abnormal   Collection Time: 06/15/16  6:45 PM  Result Value Ref Range Status   Enterococcus species NOT DETECTED NOT DETECTED Final   Listeria monocytogenes NOT DETECTED NOT DETECTED Final   Staphylococcus species DETECTED (A) NOT  DETECTED Final    Comment: CRITICAL RESULT CALLED TO, READ BACK BY AND VERIFIED WITH: C BALL,PHARMD AT 1157 06/16/16 BY L BENFIELD    Staphylococcus aureus DETECTED (A) NOT DETECTED Final    Comment: Methicillin (oxacillin) susceptible Staphylococcus aureus (MSSA). Preferred therapy is anti staphylococcal beta lactam antibiotic (Cefazolin or Nafcillin), unless clinically contraindicated. CRITICAL RESULT CALLED TO, READ BACK BY AND VERIFIED WITH: C BALL,PHARMD AT 1157 06/16/16 BY L BENFIELD    Methicillin resistance NOT DETECTED NOT DETECTED Final   Streptococcus species NOT DETECTED NOT DETECTED Final   Streptococcus agalactiae NOT DETECTED NOT DETECTED Final   Streptococcus pneumoniae NOT DETECTED NOT DETECTED Final   Streptococcus pyogenes NOT DETECTED NOT DETECTED Final   Acinetobacter baumannii NOT DETECTED NOT DETECTED Final   Enterobacteriaceae species NOT DETECTED NOT DETECTED Final   Enterobacter cloacae complex NOT DETECTED NOT DETECTED Final   Escherichia coli NOT DETECTED NOT DETECTED Final   Klebsiella oxytoca NOT DETECTED NOT DETECTED Final   Klebsiella pneumoniae NOT DETECTED NOT DETECTED Final   Proteus species NOT DETECTED NOT DETECTED Final   Serratia marcescens NOT DETECTED NOT DETECTED Final   Haemophilus influenzae NOT DETECTED NOT DETECTED Final   Neisseria meningitidis NOT DETECTED NOT DETECTED Final   Pseudomonas aeruginosa NOT DETECTED NOT DETECTED Final   Candida albicans NOT DETECTED NOT DETECTED Final   Candida glabrata NOT DETECTED NOT DETECTED Final   Candida krusei NOT DETECTED NOT DETECTED Final   Candida parapsilosis NOT DETECTED NOT DETECTED Final   Candida tropicalis NOT DETECTED NOT DETECTED Final    Studies/Results: Dg Chest Port 1 View  Result Date: 06/18/2016 CLINICAL DATA:  Acute respiratory failure, shortness of breath, intubated  patient. EXAM: PORTABLE CHEST 1 VIEW COMPARISON:  Portable chest x-ray dated June 17, 2016 FINDINGS: The lungs are  well-expanded. There is increasing density in the right infrahilar region. The retrocardiac region on the left is more dense. There are probable small bilateral pleural effusions. No recurrent left-sided pneumothorax is observed. The small caliber chest tube is in stable position. The cardiac silhouette is top-normal in size. The pulmonary vascularity is not clearly engorged. The ICD is in stable position. The endotracheal tube tip measures 4.2 cm above the carina. The feeding tube tip projects below the inferior margin of the image. IMPRESSION: Increasing bibasilar density greatest on the right worrisome for atelectasis or pneumonia. There is no pneumothorax nor significant pleural effusion. The support tubes are in stable position. Electronically Signed   By: David  Martinique M.D.   On: 06/18/2016 07:08   Dg Chest Port 1 View  Result Date: 06/17/2016 CLINICAL DATA:  Respiratory failure, COPD, CHF, atrial fibrillation, former heavy smoker. EXAM: PORTABLE CHEST 1 VIEW COMPARISON:  Portable chest x-ray of March 11th 2018 FINDINGS: The lungs are adequately inflated. There is no focal infiltrate, pneumothorax, or significant pleural effusion. There is persistent subsegmental atelectasis at both lung bases. The cardiac silhouette is enlarged but stable. The central pulmonary vascularity is mildly prominent. Of the endotracheal tube tip lies 4.6 cm above the carina. The feeding tube tip projects below the inferior margin of the image. The ICD is in stable position. The left internal jugular venous catheter tip projects over the proximal SVC. A pigtail catheter projects just inferior to the aortic arch and is stable. IMPRESSION: Improving aeration of both lungs with decreased interstitial edema. Persistent subsegmental atelectasis in the right infrahilar region. No pneumothorax or significant pleural effusion. The support devices are in reasonable position. Cardiomegaly.  Thoracic aortic atherosclerosis. Electronically  Signed   By: David  Martinique M.D.   On: 06/17/2016 07:30      Assessment/Plan:  Principal Problem:   Adjustment disorder with mixed anxiety and depressed mood Active Problems:   Permanent atrial fibrillation ([place])   Fall   S/P ICD (internal cardiac defibrillator) procedure, 08/17/12, [employer] implanted   Diabetes type 2, uncontrolled ([place])   HLD (hyperlipidemia)   Systolic CHF, chronic ([facility])   Hyponatremia   Suicidal ideation   Abnormal LFTs   ATN (acute tubular necrosis) ([facility])   CHF (congestive heart failure) ([place])   Acute encephalopathy  [name] is a 82 y.o. male with ischemic cardiomyopathy s/p ICD, DM2, seizure disorder hospitalized for hyponatremia and suicidal ideation who then developed cardiogenic shock complicated by acute on chronic kidney disease, MSSA septic shock, ventilatory drive respiratory failure complicated by left tension pneumothorax, acute encephalopathy now with diarrhea  MSSA septic shock: Suspect source may have been the wound he sustained on his left arm after fall as tracheal aspirate is not concordant with imaging findings to support pneumonia.  -Continue cefazolin [HAL 4] -Repeat blood cultures pending  Diarrhea: C. Dif pending.  We agree with the other clinicians regarding goals of care discussion and overall poor prognosis. TEE will likely not change management.    LOS: 9 days   Charlott Rakes 06/18/2016, 10:44 AM

## 2016-06-18 NOTE — Progress Notes (Signed)
PULMONARY / CRITICAL CARE MEDICINE   Name: VINCIENT VANAMAN MRN: [id number] DOB: 1935-01-06    ADMISSION DATE:  06/14/2016 CONSULTATION DATE:  06/13/2016  REFERRING MD:  Dr. Caleb Popp   CHIEF COMPLAINT:  Acute on Chronic HF  HISTORY OF PRESENT ILLNESS:   82 yo male presented [facility] 3/[facility] after fall.  He was recently started on lamictal for seizures.  Transferred to [facility] for Rt heart cath.  SUBJECTIVE:  Line was removed, BP little better Levo at 12 Poor neuro exam remains  VITAL SIGNS: BP 108/69 (BP Location: Left Arm)   Pulse (!) 119   Temp (!) 101.4 F (38.6 C) (Oral)   Resp (!) 26   Ht 5\' 4"  (1.626 m)   Wt 66.5 kg (146 lb 9.7 oz)   SpO2 96%   BMI 25.16 kg/m   HEMODYNAMICS:    INTAKE / OUTPUT: I/O last 3 completed shifts: In: 3542.5 [I.V.:1587.5; NG/GT:1340; IV Piggyback:615] Out: 960 [Urine:630; Chest Tube:330]  PHYSICAL EXAMINATION: General: not on sedation, rass -4, does attempt to bite to sternal rub Neuro: RASS -4, per sluggish 2mm slight downward gaze HEENT: ETT , jvd wnl Cardiac:  s1 s2 irregular, tachycardic Chest: coarse Abd: soft, non tender, no r/g Ext: no edema, peripheral livido Skin: no rashes  LABS:  BMET  Recent Labs Lab 06/16/16 0542 06/17/16 0401 06/18/16 0634  NA 135 139 139  K 2.8* 3.5 4.4  CL 92* 96* 95*  CO2 29 27 20*  BUN 87* 113* 149*  CREATININE 2.51* 2.69* 3.62*  GLUCOSE 155* 145* 153*    Electrolytes  Recent Labs Lab 06/14/16 1700 06/15/16 0454 06/15/16 1727 06/16/16 0542 06/17/16 0401 06/18/16 0634  CALCIUM 8.9 8.7* 8.8* 7.7* 8.1* 8.5*  MG 2.6* 2.3 2.0  --   --   --   PHOS 4.1 3.6 2.3*  --  2.2*  --     CBC  Recent Labs Lab 06/16/16 0542 06/17/16 0401 06/18/16 0634  WBC 7.9 17.2* 33.1*  HGB 14.1 16.6 17.2*  HCT 42.2 48.7 50.4  PLT 34* 46* 50*    Coag's  Recent Labs Lab 06/15/16 0454 06/16/16 0542 06/17/16 0401  INR 1.97 1.89 1.73    Sepsis Markers  Recent Labs Lab 06/15/16 1727 06/16/16 0542  06/17/16 0401  PROCALCITON 2.18 3.08 6.80    ABG  Recent Labs Lab 06/12/16 1730 06/13/16 1445 06/15/16 0357  PHART 7.347* 7.478* 7.495*  PCO2ART 25.8* 26.2* 38.6  PO2ART 90.0 102.0 132*    Liver Enzymes  Recent Labs Lab 06/16/16 0542 06/17/16 0401 06/18/16 0634  AST 75* 52* 244*  ALT 324* 200* 53  ALKPHOS 76 94 109  BILITOT 3.9* 3.5* 4.5*  ALBUMIN 2.5* 2.9* 2.8*    Cardiac Enzymes  Recent Labs Lab 06/13/16 1010 06/13/16 1600 06/13/16 2157  TROPONINI 0.25* 0.26* 0.22*    Glucose  Recent Labs Lab 06/17/16 1129 06/17/16 1556 06/17/16 2012 06/17/16 2329 06/18/16 0326 06/18/16 0805  GLUCAP 97 183* 192* 232* 198* 153*    Imaging Dg Chest Port 1 View  Result Date: 06/18/2016 CLINICAL DATA:  Acute respiratory failure, shortness of breath, intubated patient. EXAM: PORTABLE CHEST 1 VIEW COMPARISON:  Portable chest x-ray dated June 17, 2016 FINDINGS: The lungs are well-expanded. There is increasing density in the right infrahilar region. The retrocardiac region on the left is more dense. There are probable small bilateral pleural effusions. No recurrent left-sided pneumothorax is observed. The small caliber chest tube is in stable position. The cardiac silhouette  is top-normal in size. The pulmonary vascularity is not clearly engorged. The ICD is in stable position. The endotracheal tube tip measures 4.2 cm above the carina. The feeding tube tip projects below the inferior margin of the image. IMPRESSION: Increasing bibasilar density greatest on the right worrisome for atelectasis or pneumonia. There is no pneumothorax nor significant pleural effusion. The support tubes are in stable position. Electronically Signed   By: David  [place]az[place] M.D.   On: 06/18/2016 07:08    STUDIES:  CT C Spine 3/4 > Neg acute injury  CT Head 3/4 > No acute of intracranial or cervical spine injury  ECHO 3/5 > 15-20% EF, RV moderately dilated with mild to moderately decreases systolic  function  ABD [place] 3/7 > Normal hepatic vascular doppler evaluation, abdominal ascites and right pleural effusion  Renal US 3/7 > No acute findings, renal cortical atrophy and possible chronic medical rental disease, suspected nonobstructive left lower pole renal calculus, cirrhotic appearing liver with small to moderate volume ascites  ANTIBIOTICS: Zosyn 3/10 >>3/11 Vancomycin 3/10 >>3/11 Ancef 3/11  CULTURES: Urine 3/10 >> Blood 3/10 >>staph aureus Sputum 3/10 >> 3/13 BC planned  SIGNIFICANT EVENTS: 3/4 ED for fall/SI  3/7 Transfer to [facility] for cardiogenic shock, multiorgan failure 3/8 Transfer to ICU, intubated, Lt CT placed for pneumothorax, Lt IJ placed 3/10 Fever, increased WBC, increased procalcitonin >> start Abx for HCAP  LINES/TUBES: OETT 3/8 >> Lt chest tube 3/8 >> Lt IJ CVC 3/8 >>>3/12  DISCUSSION: 82 year old male with extensive cardiac and renal history. Admitted 3/4 post-fall with suicidal ideation. Admission complicated by acute on chronic systolic HF, acute hepatic injury, and acute on chronic kidney disease.   ASSESSMENT / PLAN:  Acute hypoxic respiratory failure in setting of cardiogenic shock. Lt tension PTX. Hx of OSA. -water seal, pcxr in 4 hours -weaning ps 8 -needs comfort care  Sepsis Bacteremia, presumed endocarditis / pacer wire - ancef -line out, holiday -avoid central access x 24 hours more if able -no TEE yet, let s assess neurostatus  Acute on chronic systolic CHF (EF 15 to 20%) Cardiogenic/septic shock. Hx of CAD, HTN, HLD. Hx of A fib >> no anticoagulation due to frequent falls. -levo low dose, consider map 55, appears we can reduce avoiding central acces  AKI. Hx of CKD 3. - crt rise, avoid lasix Chem in am  Doubt he is HD candidate  Acute hepatic injury 2nd to cardiogenic shock. Hx of cirrhosis. Liver failure ( sepsis) - f/u LFTs -dose adjust meds, bili, d/w phamracy  Hx of DM. - SSI  NEURO Acute metabolic  encephalopathy. Hx of seizures. R/o CVA, abscess - continue keppra -eeg result not seen, will call for result -unable MRI with pacer -get CT   DVT prophylaxis - SCDs SUP - Pepcid Nutrition - tube feeds hold for CT Goals of care - full code   CC time 30 min   Mcarthur Rossetti. Tyson Alias, MD, FACP Pgr: [phone] [facility]

## 2016-06-18 NOTE — Progress Notes (Addendum)
While cleaning patient up, patient placed in supine position for approximately 2-3 minutes, bp dropped to 50/30. MD Feintstein made aware, per MD hold off on head CT and try to arrange a family meeting tomorrow 3/14 at noon. Daughter Claris Che called and notified of family meeting, she plans to be at bedside tomorrow at noon. Contacted 5W where patients wife is admitted. Patients wife will be brought to 4N at noon to attend meeting. 5W RN Ginger made aware. Will continue to monitor.

## 2016-06-18 NOTE — Progress Notes (Signed)
PHARMACY - PHYSICIAN COMMUNICATION CRITICAL VALUE ALERT - BLOOD CULTURE IDENTIFICATION (BCID)  Repeat blood cx on 3/12 remains positive with GPC in clusters. Most likely MSSA from previous blood cx's. If continuing aggressive care will need to evaluate ICD for infection. Continues on cefazolin for now.  Enzo Bi, PharmD, BCPS Clinical Pharmacist Pager [phone] 06/18/2016 3:24 PM

## 2016-06-19 ENCOUNTER — Inpatient Hospital Stay (HOSPITAL_COMMUNITY): Payer: Medicare HMO

## 2016-06-19 LAB — CULTURE, RESPIRATORY

## 2016-06-19 LAB — BLOOD CULTURE ID PANEL (REFLEXED)
Acinetobacter baumannii: NOT DETECTED
CANDIDA ALBICANS: DETECTED — AB
CANDIDA GLABRATA: NOT DETECTED
CANDIDA TROPICALIS: NOT DETECTED
Candida krusei: NOT DETECTED
Candida parapsilosis: NOT DETECTED
ENTEROBACTER CLOACAE COMPLEX: NOT DETECTED
ENTEROBACTERIACEAE SPECIES: NOT DETECTED
Enterococcus species: NOT DETECTED
Escherichia coli: NOT DETECTED
Haemophilus influenzae: NOT DETECTED
Klebsiella oxytoca: NOT DETECTED
Klebsiella pneumoniae: NOT DETECTED
Listeria monocytogenes: NOT DETECTED
NEISSERIA MENINGITIDIS: NOT DETECTED
PSEUDOMONAS AERUGINOSA: NOT DETECTED
Proteus species: NOT DETECTED
STREPTOCOCCUS AGALACTIAE: NOT DETECTED
STREPTOCOCCUS SPECIES: NOT DETECTED
Serratia marcescens: NOT DETECTED
Staphylococcus aureus (BCID): NOT DETECTED
Staphylococcus species: NOT DETECTED
Streptococcus pneumoniae: NOT DETECTED
Streptococcus pyogenes: NOT DETECTED

## 2016-06-19 LAB — CBC WITH DIFFERENTIAL/PLATELET
BASOS ABS: 0.3 10*3/uL — AB (ref 0.0–0.1)
Basophils Relative: 1 %
EOS ABS: 0 10*3/uL (ref 0.0–0.7)
Eosinophils Relative: 0 %
HCT: 44.9 % (ref 39.0–52.0)
HEMOGLOBIN: 15.5 g/dL (ref 13.0–17.0)
Lymphocytes Relative: 5 %
Lymphs Abs: 1.6 10*3/uL (ref 0.7–4.0)
MCH: 33 pg (ref 26.0–34.0)
MCHC: 34.5 g/dL (ref 30.0–36.0)
MCV: 95.5 fL (ref 78.0–100.0)
Monocytes Absolute: 1.3 10*3/uL — ABNORMAL HIGH (ref 0.1–1.0)
Monocytes Relative: 4 %
Neutro Abs: 29.4 10*3/uL — ABNORMAL HIGH (ref 1.7–7.7)
Neutrophils Relative %: 90 %
PLATELETS: 67 10*3/uL — AB (ref 150–400)
RBC: 4.7 MIL/uL (ref 4.22–5.81)
RDW: 16.9 % — AB (ref 11.5–15.5)
WBC: 32.6 10*3/uL — AB (ref 4.0–10.5)

## 2016-06-19 LAB — GLUCOSE, CAPILLARY: GLUCOSE-CAPILLARY: 276 mg/dL — AB (ref 65–99)

## 2016-06-19 LAB — COMPREHENSIVE METABOLIC PANEL
ALK PHOS: 172 U/L — AB (ref 38–126)
ALT: 20 U/L (ref 17–63)
ANION GAP: 20 — AB (ref 5–15)
AST: 476 U/L — AB (ref 15–41)
Albumin: 2.5 g/dL — ABNORMAL LOW (ref 3.5–5.0)
BILIRUBIN TOTAL: 3.5 mg/dL — AB (ref 0.3–1.2)
BUN: 178 mg/dL — AB (ref 6–20)
CO2: 23 mmol/L (ref 22–32)
Calcium: 7.9 mg/dL — ABNORMAL LOW (ref 8.9–10.3)
Chloride: 92 mmol/L — ABNORMAL LOW (ref 101–111)
Creatinine, Ser: 4.41 mg/dL — ABNORMAL HIGH (ref 0.61–1.24)
GFR calc Af Amer: 13 mL/min — ABNORMAL LOW (ref >60)
GFR, EST NON AFRICAN AMERICAN: 12 mL/min — AB (ref >60)
Glucose, Bld: 282 mg/dL — ABNORMAL HIGH (ref 65–99)
POTASSIUM: 4.2 mmol/L (ref 3.5–5.1)
Sodium: 135 mmol/L (ref 135–145)
TOTAL PROTEIN: 5.4 g/dL — AB (ref 6.5–8.1)

## 2016-06-19 LAB — CULTURE, RESPIRATORY W GRAM STAIN

## 2016-06-19 LAB — PROTIME-INR
INR: 2.29
Prothrombin Time: 25.6 seconds — ABNORMAL HIGH (ref 11.4–15.2)

## 2016-06-19 LAB — CULTURE, BLOOD (ROUTINE X 2)

## 2016-06-19 MED ORDER — INSULIN ASPART 100 UNIT/ML ~~LOC~~ SOLN
8.0000 [IU] | Freq: Once | SUBCUTANEOUS | Status: AC
  Administered 2016-06-19: 8 [IU] via SUBCUTANEOUS

## 2016-06-19 MED ORDER — LACTULOSE 10 GM/15ML PO SOLN
30.0000 g | Freq: Two times a day (BID) | ORAL | Status: DC
  Administered 2016-06-19: 30 g via ORAL
  Filled 2016-06-19: qty 45

## 2016-06-19 MED ORDER — SODIUM CHLORIDE 0.9 % IV SOLN
10.0000 mg/h | INTRAVENOUS | Status: DC
  Administered 2016-06-19: 10 mg/h via INTRAVENOUS
  Filled 2016-06-19: qty 10

## 2016-06-19 MED ORDER — MORPHINE BOLUS VIA INFUSION
5.0000 mg | INTRAVENOUS | Status: DC | PRN
Start: 2016-06-19 — End: 2016-06-20
  Filled 2016-06-19: qty 20

## 2016-06-20 LAB — CULTURE, BLOOD (ROUTINE X 2)

## 2016-06-20 NOTE — Progress Notes (Signed)
Spoke with Medical examiner Amy Annia Friendly explained about fall death is not related. Stated that patient is not medical examiner case. Ilean Skill LPN

## 2016-06-21 ENCOUNTER — Telehealth: Payer: Self-pay

## 2016-06-21 NOTE — Telephone Encounter (Signed)
On 19-Jul-2016 I received a death certificate from [facility] (faxed). The death certificate is for cremation. The patient is a patient of Doctor Tyson Alias. The death certificate will be taken to [facility]) this am for Doctor Marchelle Gearing to sign since Doctor Tyson Alias is off until Monday and it is for a cremation.  On 2016/07/19 I received the death certificate back from Doctor Ramaswamy. I got the death certificate ready and called the funeral home to let them know I faxed the death certificate to the funeral home per the funeral home request.

## 2016-06-24 ENCOUNTER — Telehealth: Payer: Self-pay

## 2016-06-24 NOTE — Discharge Summary (Signed)
NAMEDANNI, MEES                  ACCOUNT NO.:  [id number]  MEDICAL RECORD NO.:  [id number]  LOCATION:  [id number]                        FACILITY:  [facility]  PHYSICIAN:  Nelda Bucks, MD DATE OF BIRTH:  Jul 01, 1934  DATE OF ADMISSION:  2016-06-28 DATE OF DISCHARGE:                              DISCHARGE SUMMARY   DEATH SUMMARY:  This is a 82 year old male, presented from [facility] on 03/05 after a fall.  He recently was started on Lamictal for seizures.  He was transferred to [facility] for right heart catheterization.  His hospital course was significant for being transferred to [facility], being found to be in cardiogenic shock and multiorgan failure.  On March 7th, transferred to the ICU, required intubation, developed unfortunately a spontaneous positive pressure- related pneumothorax requiring chest tube placement with successfully drainage of air.  Also had a left IJ placed, not related to the pneumothorax.  He continued to do very poorly, had Renal consultation. The patient was noted not be a candidate for dialysis.  He was being followed and managed by the heart failure service with inotropes and pressors for cardiogenic shock and continued to worsen.  Had fevers, increased white count, and increased procalcitonin.  On March 10th, treated for hospital-acquired pneumonia.  Essentially, continued to do very poorly and was not going to survive.  He was not a candidate for dialysis and was suffering through this.  He had very poor neurologic status related to severe encephalopathy, most likely metabolic and hepatic in nature and the patient had comfort care provided and expired.  FINAL DIAGNOSIS UPON DEATH: 1. Cardiogenic shock secondary to heart failure. 2. Acute respiratory failure. 3. Spontaneous positive pressure-related pneumothorax. 4. Encephalopathy, severe, acute metabolic and hepatic. 5. 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  Presumed pacer wire  infection. 6. Acute renal failure on top of chronic renal failure. 7. Hepatic acute injury secondary to cardiogenic shock.     Nelda Bucks, MD     DJF/MEDQ  D:  06/24/2016  T:  06/24/2016  Job:  [id number]

## 2016-06-24 NOTE — Telephone Encounter (Signed)
On 06/24/16 I received a death certificate from [facility] (original). The death certificate is for cremation. The patient is a patient of Doctor Ramaswamy.  The death certificate will be taken to Pulmonary Unit @ [facility] for signature.   On 2016/07/24 I received the death certificate back from Doctor Ramaswamy. I got the death certificate ready and called the funeral home to let them know I mailed the death certificate to [employer] per the funeral home request.

## 2016-07-07 NOTE — Progress Notes (Signed)
Paged ELink patient expired [phone] verfied by 2 nurses. Ilean Skill LPN

## 2016-07-07 NOTE — Progress Notes (Signed)
PHARMACY - PHYSICIAN COMMUNICATION CRITICAL VALUE ALERT - BLOOD CULTURE IDENTIFICATION (BCID)  Results for orders placed or performed during the hospital encounter of 06/08/2016  Blood Culture ID Panel (Reflexed) (Collected: 06/17/2016 12:08 PM)  Result Value Ref Range   Enterococcus species NOT DETECTED NOT DETECTED   Listeria monocytogenes NOT DETECTED NOT DETECTED   Staphylococcus species NOT DETECTED NOT DETECTED   Staphylococcus aureus NOT DETECTED NOT DETECTED   Streptococcus species NOT DETECTED NOT DETECTED   Streptococcus agalactiae NOT DETECTED NOT DETECTED   Streptococcus pneumoniae NOT DETECTED NOT DETECTED   Streptococcus pyogenes NOT DETECTED NOT DETECTED   Acinetobacter baumannii NOT DETECTED NOT DETECTED   Enterobacteriaceae species NOT DETECTED NOT DETECTED   Enterobacter cloacae complex NOT DETECTED NOT DETECTED   Escherichia coli NOT DETECTED NOT DETECTED   Klebsiella oxytoca NOT DETECTED NOT DETECTED   Klebsiella pneumoniae NOT DETECTED NOT DETECTED   Proteus species NOT DETECTED NOT DETECTED   Serratia marcescens NOT DETECTED NOT DETECTED   Haemophilus influenzae NOT DETECTED NOT DETECTED   Neisseria meningitidis NOT DETECTED NOT DETECTED   Pseudomonas aeruginosa NOT DETECTED NOT DETECTED   Candida albicans DETECTED (A) NOT DETECTED   Candida glabrata NOT DETECTED NOT DETECTED   Candida krusei NOT DETECTED NOT DETECTED   Candida parapsilosis NOT DETECTED NOT DETECTED   Candida tropicalis NOT DETECTED NOT DETECTED    Name of physician (or Provider) Contacted: Sommer  Changes to prescribed antibiotics required: None, comfort care planned.    Tad Moore, BCPS  Clinical Pharmacist Pager [phone]  27-Jun-2016 8:16 PM

## 2016-07-07 NOTE — Telephone Encounter (Signed)
error 

## 2016-07-07 NOTE — Progress Notes (Signed)
Nutrition Brief Note  Chart reviewed. Family meeting today, pt to transition to comfort care.  No further nutrition interventions at this time.  Please re-consult as needed.   Kendell Bane RD, LDN, CNSC [phone] Pager [phone] After Hours Pager

## 2016-07-07 NOTE — Progress Notes (Signed)
Patient ID: [name], male   DOB: 25-May-1934, 82 y.o.   MRN: [id number]  [facility] Progress Note    Assessment/ Plan:   1. AKI on chronic kidney disease stage III: Suspected to be ATN associated with sepsis/hemodynamically mediated with CHF decompensation. Marginal UOP with worsening renal function overnight. Based on comorbidities and dismal, NOT a candidate for any forms of RRT. Would recommend palliative/comfort care approach at this point as discussed with CCM and cardiology services who concur. 2. Acute hypoxic respiratory failure with left pneumothorax status post chest tube-continue ventilator support per CCM 3. MSSA bacteremia/sepsis: Suspected to be associated with pacer wire infection/endocarditis-on Ancef 4. Anoxic brain injury status post fall/seizure  Subjective:   No acute events overnight, remains unresponsive and without any clinical improvement of MODS   Objective:   BP (!) 82/66   Pulse (!) 101   Temp 99.5 F (37.5 C) (Rectal)   Resp (!) 23   Ht '5\' 4"'$  (1.626 m)   Wt 68 kg (149 lb 14.6 oz)   SpO2 97%   BMI 25.73 kg/m   Intake/Output Summary (Last 24 hours) at 2016/07/06 0939 Last data filed at 07/06/2016 0800  Gross per 24 hour  Intake          2037.26 ml  Output              605 ml  Net          1432.26 ml   Weight change: 1.5 kg (3 lb 4.9 oz)  Physical Exam: [id number], unresponsive CVS: Irregularly Irregular tachycardia Resp: Coarse breath sounds bilaterally, left chest tube Abd: Soft, obese, nontender Ext: Trace ankle edema. Dusky toes and fingertips  Imaging: Dg Chest Port 1 View  Result Date: 2016-07-06 CLINICAL DATA:  Hypoxia EXAM: PORTABLE CHEST 1 VIEW COMPARISON:  June 18, 2016 FINDINGS: Endotracheal tube tip is 5.1 cm above the carina. Feeding to tip is below the diaphragm. Pigtail catheter present in the left hemithorax. Pacemaker lead attached right ventricle. There is no appreciable pneumothorax. There is patchy bibasilar  atelectasis with small bilateral pleural effusions. No airspace consolidation. There is cardiomegaly. The pulmonary vascularity is within normal limits. There is atherosclerotic calcification in the aorta. No evident bone lesions. IMPRESSION: Tube positions as described without pneumothorax. Stable cardiomegaly. There is aortic atherosclerosis. There are small pleural effusions bilaterally with mild bibasilar atelectasis. No airspace consolidation. Electronically Signed   By: Lowella Grip III M.D.   On: 07/06/16 07:04   Dg Chest Port 1 View  Result Date: 06/18/2016 CLINICAL DATA:  Pneumothorax. EXAM: PORTABLE CHEST 1 VIEW COMPARISON:  06/18/2016. FINDINGS: Endotracheal tube, feeding tube, left chest tube in stable position. Cardiac pacer in stable position. Cardiomegaly with pulmonary vascular prominence and bilateral interstitial prominence consistent with CHF. Persistent right base infiltrate with slight interval clearing. Small bilateral pleural effusions. No pneumothorax . IMPRESSION: 1. Lines and tubes including left chest tube in stable position. No pneumothorax. 2. Persistent right base infiltrate consistent with pneumonia, slight interim clearing. 3. Cardiac pacer stable position. Cardiomegaly with bilateral pulmonary interstitial prominence and small bilateral pleural effusions consistent with CHF. Electronically Signed   By: Marcello Moores  Register   On: 06/18/2016 14:31   Dg Chest Port 1 View  Result Date: 06/18/2016 CLINICAL DATA:  Acute respiratory failure, shortness of breath, intubated patient. EXAM: PORTABLE CHEST 1 VIEW COMPARISON:  Portable chest x-ray dated June 17, 2016 FINDINGS: The lungs are well-expanded. There is increasing density in the right infrahilar region. The  retrocardiac region on the left is more dense. There are probable small bilateral pleural effusions. No recurrent left-sided pneumothorax is observed. The small caliber chest tube is in stable position. The cardiac  silhouette is top-normal in size. The pulmonary vascularity is not clearly engorged. The ICD is in stable position. The endotracheal tube tip measures 4.2 cm above the carina. The feeding tube tip projects below the inferior margin of the image. IMPRESSION: Increasing bibasilar density greatest on the right worrisome for atelectasis or pneumonia. There is no pneumothorax nor significant pleural effusion. The support tubes are in stable position. Electronically Signed   By: David  Martinique M.D.   On: 06/18/2016 07:08    Labs: BMET  Recent Labs Lab 06/14/16 0352 06/14/16 0951 06/14/16 1700 06/15/16 0454 06/15/16 1727 06/16/16 0542 06/17/16 0401 06/18/16 0634 06/27/16 0303  NA 127*  --  128* 131* 135 135 139 139 135  K 4.6  --  3.5 3.3* 3.2* 2.8* 3.5 4.4 4.2  CL 90*  --  87* 87* 89* 92* 96* 95* 92*  CO2 25  --  '27 29 31 29 27 '$ 20* 23  GLUCOSE 104*  --  129* 119* 182* 155* 145* 153* 282*  BUN 77*  --  76* 82* 88* 87* 113* 149* 178*  CREATININE 3.57*  --  3.23* 3.09* 2.71* 2.51* 2.69* 3.62* 4.41*  CALCIUM 9.1  --  8.9 8.7* 8.8* 7.7* 8.1* 8.5* 7.9*  PHOS 5.7* 5.1* 4.1 3.6 2.3*  --  2.2*  --   --    CBC  Recent Labs Lab 06/16/16 0542 06/17/16 0401 06/18/16 0634 2016/06/27 0303  WBC 7.9 17.2* 33.1* 32.6*  NEUTROABS  --   --   --  29.4*  HGB 14.1 16.6 17.2* 15.5  HCT 42.2 48.7 50.4 44.9  MCV 97.9 99.0 98.1 95.5  PLT 34* 46* 50* 67*    Medications:    .  ceFAZolin (ANCEF) IV  2 g Intravenous Q12H  . chlorhexidine gluconate (MEDLINE KIT)  15 mL Mouth Rinse BID  . famotidine  20 mg Per Tube Daily  . feeding supplement (PRO-STAT SUGAR FREE 64)  60 mL Per Tube BID  . feeding supplement (VITAL 1.5 CAL)  1,000 mL Per Tube Q24H  . insulin aspart  2-6 Units Subcutaneous Q4H  . lactulose  30 g Oral BID  . levETIRAcetam  500 mg Intravenous Q12H  . mouth rinse  15 mL Mouth Rinse 10 times per day  . sodium chloride flush  3 mL Intravenous Q12H   Elmarie Shiley, MD 06-27-2016, 9:39 AM

## 2016-07-07 NOTE — Progress Notes (Signed)
Chaplain was paged for support of an actively dying Pt. Chaplain reported to nurse and check with staus. Pt is hours from death. Two daughters and wife are present. Wife pt in next room. Chaplain prayed with family and help them to release fater. Daughter kept tell hime to wake up and get better. Chaplain helped them see it is better to let him go for a healing.  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 Asked if theyb needed anything elise and they release Chaplain . They may call when he transition.    07/06/2016 2200  Clinical Encounter Type  Visited With Patient and family together  Visit Type Patient actively dying  Referral From Nurse  Spiritual Encounters  Spiritual Needs Emotional;Grief support  Stress Factors  Patient Stress Factors Major life changes  Family Stress Factors Health changes

## 2016-07-07 NOTE — Progress Notes (Addendum)
PULMONARY / CRITICAL CARE MEDICINE   Name: [name] MRN: [id number] DOB: [birth year]    ADMISSION DATE:  06/06/2016 CONSULTATION DATE:  06/13/2016  REFERRING MD:  Dr. Caleb Popp   CHIEF COMPLAINT:  Acute on Chronic HF  HISTORY OF PRESENT ILLNESS:   82 yo male presented to [facility] 3/[facility] after fall.  He was recently started on lamictal for seizures.  Transferred to [facility] for Rt heart cath.  SUBJECTIVE:  Unable to head CT with hemodynamics and sats Poor neurostatus  VITAL SIGNS: BP (!) 82/66   Pulse (!) 101   Temp 99.5 F (37.5 C) (Rectal)   Resp (!) 23   Ht 5\' 4"  (1.626 m)   Wt 68 kg (149 lb 14.6 oz)   SpO2 97%   BMI 25.73 kg/m   HEMODYNAMICS:    INTAKE / OUTPUT: I/O last 3 completed shifts: In: 3195.2 [I.V.:1300.2; NG/GT:1280; IV Piggyback:615] Out: 955 [Urine:310; Stool:275; Chest Tube:370]  PHYSICAL EXAMINATION: General: not on sedation, rass -4, biting with stermal rub, opened eyes to pain Neuro: RASS -4, per sluggish 2mm HEENT: ETT , jvd wnl Cardiac:  s1 s2 irregular, tachycardic, slight 104 Chest: coarse Abd: soft, non tender, no r/g Ext: no edema, peripheral livido Skin: no rashes  LABS:  BMET  Recent Labs Lab 06/17/16 0401 06/18/16 0634 2016-06-21 0303  NA 139 139 135  K 3.5 4.4 4.2  CL 96* 95* 92*  CO2 27 20* 23  BUN 113* 149* 178*  CREATININE 2.69* 3.62* 4.41*  GLUCOSE 145* 153* 282*    Electrolytes  Recent Labs Lab 06/14/16 1700 06/15/16 0454 06/15/16 1727  06/17/16 0401 06/18/16 0634 June 21, 2016 0303  CALCIUM 8.9 8.7* 8.8*  < > 8.1* 8.5* 7.9*  MG 2.6* 2.3 2.0  --   --   --   --   PHOS 4.1 3.6 2.3*  --  2.2*  --   --   < > = values in this interval not displayed.  CBC  Recent Labs Lab 06/17/16 0401 06/18/16 0634 2016-06-21 0303  WBC 17.2* 33.1* 32.6*  HGB 16.6 17.2* 15.5  HCT 48.7 50.4 44.9  PLT 46* 50* 67*    Coag's  Recent Labs Lab 06/16/16 0542 06/17/16 0401 06/21/2016 0303  INR 1.89 1.73 2.29    Sepsis Markers  Recent  Labs Lab 06/15/16 1727 06/16/16 0542 06/17/16 0401  PROCALCITON 2.18 3.08 6.80    ABG  Recent Labs Lab 06/12/16 1730 06/13/16 1445 06/15/16 0357  PHART 7.347* 7.478* 7.495*  PCO2ART 25.8* 26.2* 38.6  PO2ART 90.0 102.0 132*    Liver Enzymes  Recent Labs Lab 06/17/16 0401 06/18/16 0634 2016-06-21 0303  AST 52* 244* 476*  ALT 200* 53 20  ALKPHOS 94 109 172*  BILITOT 3.5* 4.5* 3.5*  ALBUMIN 2.9* 2.8* 2.5*    Cardiac Enzymes  Recent Labs Lab 06/13/16 1010 06/13/16 1600 06/13/16 2157  TROPONINI 0.25* 0.26* 0.22*    Glucose  Recent Labs Lab 06/18/16 0805 06/18/16 1120 06/18/16 1642 06/18/16 1945 06/18/16 2324 Jun 21, 2016 0305  GLUCAP 153* 186* 178* 171* 194* 276*    Imaging Dg Chest Port 1 View  Result Date: 06-21-2016 CLINICAL DATA:  Hypoxia EXAM: PORTABLE CHEST 1 VIEW COMPARISON:  June 18, 2016 FINDINGS: Endotracheal tube tip is 5.1 cm above the carina. Feeding to tip is below the diaphragm. Pigtail catheter present in the left hemithorax. Pacemaker lead attached right ventricle. There is no appreciable pneumothorax. There is patchy bibasilar atelectasis with small bilateral pleural effusions. No airspace consolidation.  There is cardiomegaly. The pulmonary vascularity is within normal limits. There is atherosclerotic calcification in the aorta. No evident bone lesions. IMPRESSION: Tube positions as described without pneumothorax. Stable cardiomegaly. There is aortic atherosclerosis. There are small pleural effusions bilaterally with mild bibasilar atelectasis. No airspace consolidation. Electronically Signed   By: Bretta Bang III M.D.   On: 06/14/2016 07:04   Dg Chest Port 1 View  Result Date: 06/18/2016 CLINICAL DATA:  Pneumothorax. EXAM: PORTABLE CHEST 1 VIEW COMPARISON:  06/18/2016. FINDINGS: Endotracheal tube, feeding tube, left chest tube in stable position. Cardiac pacer in stable position. Cardiomegaly with pulmonary vascular prominence and  bilateral interstitial prominence consistent with CHF. Persistent right base infiltrate with slight interval clearing. Small bilateral pleural effusions. No pneumothorax . IMPRESSION: 1. Lines and tubes including left chest tube in stable position. No pneumothorax. 2. Persistent right base infiltrate consistent with pneumonia, slight interim clearing. 3. Cardiac pacer stable position. Cardiomegaly with bilateral pulmonary interstitial prominence and small bilateral pleural effusions consistent with CHF. Electronically Signed   By: Maisie Fus  Register   On: 06/18/2016 14:31    STUDIES:  CT C Spine 3/4 > Neg acute injury  CT Head 3/4 > No acute of intracranial or cervical spine injury  ECHO 3/5 > 15-20% EF, RV moderately dilated with mild to moderately decreases systolic function  ABD [place] 3/7 > Normal hepatic vascular doppler evaluation, abdominal ascites and right pleural effusion  Renal US 3/7 > No acute findings, renal cortical atrophy and possible chronic medical rental disease, suspected nonobstructive left lower pole renal calculus, cirrhotic appearing liver with small to moderate volume ascites  ANTIBIOTICS: Zosyn 3/10 >>3/11 Vancomycin 3/10 >>3/11 Ancef 3/11>>>  CULTURES: Urine 3/10 >> Blood 3/10 >>staph aureus Sputum 3/10 >>  3/13 BC planned  SIGNIFICANT EVENTS: 3/4 ED for fall/SI  3/7 Transfer to [facility] for cardiogenic shock, multiorgan failure 3/8 Transfer to ICU, intubated, Lt CT placed for pneumothorax, Lt IJ placed 3/10 Fever, increased WBC, increased procalcitonin >> start Abx for HCAP  LINES/TUBES: OETT 3/8 >> Lt chest tube 3/8 >> Lt IJ CVC 3/8 >>>3/12  DISCUSSION: 82 year old male with extensive cardiac and renal history. Admitted 3/4 post-fall with suicidal ideation. Admission complicated by acute on chronic systolic HF, acute hepatic injury, and acute on chronic kidney disease.   ASSESSMENT / PLAN:  Acute hypoxic respiratory failure in setting of cardiogenic shock. Lt  tension PTX. Hx of OSA. -water seal, no PTX, no leak, maintain CT for now -pcxr in am for ptx risk -weaning cpa 5 ps 5, goal 2 hours, may need ps increase -will discuss comfort as goal with family today at noon  Sepsis Bacteremia, presumed endocarditis / pacer wire - ancef, but has continued pos BC -avoiding central access -no TEE, will not change outcome -will discuss comfort care  Acute on chronic systolic CHF (EF 15 to 20%) Cardiogenic/septic shock. Hx of CAD, HTN, HLD. Hx of A fib >> no anticoagulation due to frequent falls. -levo low dose, consider map 55, levo at 6, monitor PIV closely  avoiding central acces  AKI. Hx of CKD 3. - not candidate HD May need restart lasix in infusion Needs comfort care  Acute hepatic injury 2nd to cardiogenic shock. Hx of cirrhosis. Liver failure ( sepsis) - f/u LFT, bili noted -adding lactulose, likley will need rifax  Hx of DM. - SSI  NEURO Acute metabolic encephalopathy. Hx of seizures. R/o CVA, abscess At risk hepatic enceph - continue keppra -add lactulos, may need rifax,  wont change outcome  -eeg 3/9- triphasics, slow -get CT, unable with hemodynamics and vent   DVT prophylaxis - SCDs SUP - Pepcid Nutrition - tube feeds hold for CT Goals of care - full code family meeting today noon   CC time 30 min   Mcarthur Rossetti. Tyson Alias, MD, FACP Pgr: [phone] [facility]   I have had extensive discussions with family wife, kids, grandchildren We discussed patients current circumstances and organ failures. We also discussed patient's prior wishes under circumstances such as this. Family has decided to NOT perform resuscitation if arrest and now provide comfort care.  Mcarthur Rossetti. Tyson Alias, MD, FACP Pgr: [phone] [facility]

## 2016-07-07 NOTE — Progress Notes (Signed)
Notified [facility] MD Nicholos Johns regarding pts CBG of 276, orders received for a one time dose of 8 units of novolog insulin.

## 2016-07-07 NOTE — Progress Notes (Signed)
Advanced Heart Failure Rounding Note   Subjective:    3/8 Intubated, then developed left PTX thought to be due to positive pressure ventilation. CT placed.   Found to have MSSA in sputum and blood with septic shock.   Remains on levophed at 6 mcg.  PCT > 6.80. 06/17/16. Probable RLL PNA. Now on Ancef alone.    Off sedation since 8 am 06/15/16. Responsiveness has gradually decreased. Pt remains unresponsive. No purposeful movement.  Family meeting planned for this afternoon.    Developed septic shock 06/17/16 with Tmax 104.8. Tmax 101.4.   AF rates in 100s. Remains on amiodarone 30 mg/hr.    EEG: With diffuse slowing and focal left cerebral dysfunction   Creatinine 2.24> 3.4>3.72>3.57 > 3.09 > 2.5 > 2.69 > 3.62 > 4.41. BUN 178.  to 149. Oliguric with only 180 cc of urine total.      Objective:   Weight Range:  Vital Signs:   Temp:  [98.5 F (36.9 C)-100 F (37.8 C)] 99.5 F (37.5 C) (03/14 0800) Pulse Rate:  [91-127] 104 (03/14 0750) Resp:  [18-25] 23 (03/14 0750) BP: (49-107)/(32-78) 78/60 (03/14 0750) SpO2:  [92 %-99 %] 97 % (03/14 0750) FiO2 (%):  [40 %] 40 % (03/14 0750) Weight:  [149 lb 14.6 oz (68 kg)] 149 lb 14.6 oz (68 kg) (03/14 0337) Last BM Date: 06/18/16  Weight change: Filed Weights   06/17/16 0404 06/18/16 0332 26-Jun-2016 0337  Weight: 145 lb 15.1 oz (66.2 kg) 146 lb 9.7 oz (66.5 kg) 149 lb 14.6 oz (68 kg)    Intake/Output:   Intake/Output Summary (Last 24 hours) at Jun 26, 2016 0825 Last data filed at 06-26-2016 0800  Gross per 24 hour  Intake          2095.26 ml  Output              605 ml  Net          1490.26 ml     Physical Exam: General:  Intubated. HEENT: +ETT Neck: supple. JVP elevated to ear with prominent CV waves. Carotids 2+ bilat; no bruits. No thyromegaly or nodule noted.  Cor: PMI nondisplaced. Irregular. Tachy.   Lungs: Rhonchi throughout.  Abdomen: mildly distended, ND, no HSM. No bruits or masses. +BS   Extremities: 1-2+  peripheral edema. Hands and feet cyanotic and cold to the touch. R and LLE SCDs. Diffuse echymosis.  Neuro: Intubated, unresponsive.    Telemetry: Reviewed personally, Afib 100-110s    Labs: Basic Metabolic Panel:  Recent Labs Lab 06/14/16 0352 06/14/16 0951 06/14/16 1700 06/15/16 0454 06/15/16 1727 06/16/16 0542 06/17/16 0401 06/18/16 0634 June 26, 2016 0303  NA 127*  --  128* 131* 135 135 139 139 135  K 4.6  --  3.5 3.3* 3.2* 2.8* 3.5 4.4 4.2  CL 90*  --  87* 87* 89* 92* 96* 95* 92*  CO2 25  --  _0 20* 23  GLUCOSE 104*  --  129* 119* 182* 155* 145* 153* 282*  BUN 77*  --  76* 82* 88* 87* 113* 149* 178*  CREATININE 3.57*  --  3.23* 3.09* 2.71* 2.51* 2.69* 3.62* 4.41*  CALCIUM 9.1  --  8.9 8.7* 8.8* 7.7* 8.1* 8.5* 7.9*  MG 3.0* 2.8* 2.6* 2.3 2.0  --   --   --   --   PHOS 5.7* 5.1* 4.1 3.6 2.3*  --  2.2*  --   --     Liver Function  Tests:  Recent Labs Lab 06/15/16 0454 06/16/16 0542 06/17/16 0401 06/18/16 0634 06/28/16 0303  AST 227* 75* 52* 244* 476*  ALT 633* 324* 200* 53 20  ALKPHOS 83 76 94 109 172*  BILITOT 4.0* 3.9* 3.5* 4.5* 3.5*  PROT 5.4* 4.8* 6.0* 5.9* 5.4*  ALBUMIN 3.2* 2.5* 2.9* 2.8* 2.5*   No results for input(s): LIPASE, AMYLASE in the last 168 hours.  Recent Labs Lab 06/12/16 1342 06/13/16 1010  AMMONIA 45* 38*    CBC:  Recent Labs Lab 06/15/16 0454 06/16/16 0542 06/17/16 0401 06/18/16 0634 2016-06-28 0303  WBC 9.0 7.9 17.2* 33.1* 32.6*  NEUTROABS  --   --   --   --  29.4*  HGB 14.1 14.1 16.6 17.2* 15.5  HCT 41.6 42.2 48.7 50.4 44.9  MCV 97.7 97.9 99.0 98.1 95.5  PLT 50* 34* 46* 50* 67*    Cardiac Enzymes:  Recent Labs Lab 06/12/16 1342 06/12/16 1530 06/13/16 1010 06/13/16 1600 06/13/16 2157  CKTOTAL  --  38*  --   --   --   TROPONINI 0.38*  --  0.25* 0.26* 0.22*    BNP: BNP (last 3 results)  Recent Labs  05/30/16 1336 06/08/2016 2111 06/12/16 1343  BNP 1,104.3* 1,879.0* 2,370.0*    ProBNP (last 3  results) No results for input(s): PROBNP in the last 8760 hours.    Other results:  Imaging: Dg Chest Port 1 View  Result Date: 2016-06-28 CLINICAL DATA:  Hypoxia EXAM: PORTABLE CHEST 1 VIEW COMPARISON:  June 18, 2016 FINDINGS: Endotracheal tube tip is 5.1 cm above the carina. Feeding to tip is below the diaphragm. Pigtail catheter present in the left hemithorax. Pacemaker lead attached right ventricle. There is no appreciable pneumothorax. There is patchy bibasilar atelectasis with small bilateral pleural effusions. No airspace consolidation. There is cardiomegaly. The pulmonary vascularity is within normal limits. There is atherosclerotic calcification in the aorta. No evident bone lesions. IMPRESSION: Tube positions as described without pneumothorax. Stable cardiomegaly. There is aortic atherosclerosis. There are small pleural effusions bilaterally with mild bibasilar atelectasis. No airspace consolidation. Electronically Signed   By: Lowella Grip III M.D.   On: 28-Jun-2016 07:04   Dg Chest Port 1 View  Result Date: 06/18/2016 CLINICAL DATA:  Pneumothorax. EXAM: PORTABLE CHEST 1 VIEW COMPARISON:  06/18/2016. FINDINGS: Endotracheal tube, feeding tube, left chest tube in stable position. Cardiac pacer in stable position. Cardiomegaly with pulmonary vascular prominence and bilateral interstitial prominence consistent with CHF. Persistent right base infiltrate with slight interval clearing. Small bilateral pleural effusions. No pneumothorax . IMPRESSION: 1. Lines and tubes including left chest tube in stable position. No pneumothorax. 2. Persistent right base infiltrate consistent with pneumonia, slight interim clearing. 3. Cardiac pacer stable position. Cardiomegaly with bilateral pulmonary interstitial prominence and small bilateral pleural effusions consistent with CHF. Electronically Signed   By: Marcello Moores  Register   On: 06/18/2016 14:31   Dg Chest Port 1 View  Result Date:  06/18/2016 CLINICAL DATA:  Acute respiratory failure, shortness of breath, intubated patient. EXAM: PORTABLE CHEST 1 VIEW COMPARISON:  Portable chest x-ray dated June 17, 2016 FINDINGS: The lungs are well-expanded. There is increasing density in the right infrahilar region. The retrocardiac region on the left is more dense. There are probable small bilateral pleural effusions. No recurrent left-sided pneumothorax is observed. The small caliber chest tube is in stable position. The cardiac silhouette is top-normal in size. The pulmonary vascularity is not clearly engorged. The ICD is in stable position. The endotracheal  tube tip measures 4.2 cm above the carina. The feeding tube tip projects below the inferior margin of the image. IMPRESSION: Increasing bibasilar density greatest on the right worrisome for atelectasis or pneumonia. There is no pneumothorax nor significant pleural effusion. The support tubes are in stable position. Electronically Signed   By: David  Martinique M.D.   On: 06/18/2016 07:08     Medications:     Scheduled Medications: .  ceFAZolin (ANCEF) IV  2 g Intravenous Q12H  . chlorhexidine gluconate (MEDLINE KIT)  15 mL Mouth Rinse BID  . Chlorhexidine Gluconate Cloth  6 each Topical Daily  . famotidine  20 mg Per Tube Daily  . feeding supplement (PRO-STAT SUGAR FREE 64)  60 mL Per Tube BID  . feeding supplement (VITAL 1.5 CAL)  1,000 mL Per Tube Q24H  . insulin aspart  2-6 Units Subcutaneous Q4H  . levETIRAcetam  500 mg Intravenous Q12H  . mouth rinse  15 mL Mouth Rinse 10 times per day  . sodium chloride flush  10-40 mL Intracatheter Q12H  . sodium chloride flush  3 mL Intravenous Q12H    Infusions: . amiodarone 30 mg/hr (2016-06-21 0800)  . norepinephrine (LEVOPHED) 57m / 2571minfusion 6 mcg/min (0303/16/2018800)    PRN Medications: fentaNYL (SUBLIMAZE) injection, ipratropium-albuterol, MUSCLE RUB, [DISCONTINUED] ondansetron **OR** ondansetron (ZOFRAN) IV, sodium chloride  flush   Assessment/Plan/Discussin    1.  A/C Biventricular Heart Failure -> cardiogenic shock - echo 3/5 EF 15-20% RV moderately HK. ICM. (EF similar to past) - Now with septic shock.  - Remains on levophed at 6 mcg/min - Volume status elevated and worsening. Now in severe renal failure. He is not CVVHD candidate. BUN 178 - AST trending up with shock.   - No BB blocker or ACE/ARB with shock/AKI 2. Acute respiratory failure and tension PTX - Remains intubated. - s/p chest tube.  - now with MSSA HCAP. Vanc/zosyn started 3/10.   - Remains on vent. CCM following. Tolerating SBT but remains unresponsive - No change to current. 3. MSSA septic shock/HCAP - MSSA in sputum and blood. Vanc/zosyn started 3/10. Narrowed to Ancef 06/16/16 - Continue vasopressor support. Now off dobutamine, remains on norepinephrine.  - He has ICD in place. If recovers neurologically will need TEE to look at ICD, but suspect he will not.  - WBC 7 -> 17 -> 33.1 ->32.6. PCT up to > 6 06/17/16. Remains on Ancef alone per ID.  - Per CCM/ID.  4. AKI on CKD - Likely ATN due to hypotension/shock. Cardiorenal syndrome - Nephrology following. Creatinine and BUN worsening. Unlikely candidate for CVVHD.   -Off lasix in setting of sepsis.  5. Anoxic brain injury -- Admitted after fall. CT of head negative for bleed.  -- Results of EEG reviewed. Will continue to follow off sedation. Prognosis remains poor.  -- No change.  6. Chronic A fib -- Rate up in setting of sepsis. Unable to use b-blocker with low BP/shock.  -- Amio gtt for rate control.   -- Not anticoagulated due to falls. SCDs in place.  -- No change to current plan.   7. CAD with ICM -- On ASA 81.  No statin with markedly elevated LFTs.  -- No recent cath. Myoview 2/14. Extensive scar LAD and RCA territories. Not gated due to AF 8. Shock liver - GI has seen. USKoreabd/Kidney completed. No acute findings. Likely due to shock.  9. Hypokalemia - Continue supp.    10. Loose stools - ?  CDiff with high WBC and high fever.   Pt showing no improvement and clinically worsening. Family meeting this afternoon. Will likely transition to comfort care.  He is not an HD candidate.   Length of Stay: [address]  Annamaria Helling  July 13, 2016, 8:25 AM  Advanced Heart Failure Team Pager [phone] (M-F; 7a - 4p)  Please contact [facility] for night-coverage after hours (4p -7a ) and weekends on [email or web address]  Patient seen with PA, agree with the above note.  Severe septic shock with worsening BP, norepinephrine via a peripheral IV.  Worsening renal function and oliguria, not candidate for RRT.  MSSA bacteremia, concern for ICD infection.  No neurological recovery evident off sedation.   At this point, prognosis is very poor and patient's clinical situation is worsening.  I would favor transition to comfort care.  There will be family meeting to discuss this today.   Loralie Champagne 07/13/16 10:45 AM

## 2016-07-07 NOTE — Progress Notes (Signed)
   17-Jul-2016 1632  Clinical Encounter Type  Visited With Patient  Visit Type Initial  Referral From Nurse  Consult/Referral To Chaplain  Recommendations (follow up as needed)  Spiritual Encounters  Spiritual Needs Other (Comment) (none identified, follow up)  Pt. Asleep at moment, will follow up as needed.  Chaplain Rosamond Andress A. Alli Jasmer [phone]

## 2016-07-07 NOTE — Progress Notes (Signed)
eLink Physician-Brief Progress Note Patient Name: [name] DOB: 13-Jun-1934 MRN: [id number]   Date of Service  06/11/2016  HPI/Events of Note  Patient now comfort care. Request for transfer to Palliative Care Bed.   eICU Interventions  Will transfer to Palliative Care Bed.      Intervention Category Minor Interventions: Routine modifications to care plan (e.g. PRN medications for pain, fever)  Sommer,Steven Eugene 07/06/2016, 6:02 PM

## 2016-07-07 NOTE — Progress Notes (Signed)
Patient extubated with the withdrawal of life sustaining measures protocol to room air. Patient comfortable at this time. RN and family at bedside.

## 2016-07-07 NOTE — Progress Notes (Signed)
Morphine drip discontinued. Wasted in sink with Ilean Skill, LPN.

## 2016-07-07 DEATH — deceased

## 2016-07-12 ENCOUNTER — Encounter (HOSPITAL_BASED_OUTPATIENT_CLINIC_OR_DEPARTMENT_OTHER): Payer: Medicare HMO

## 2016-07-17 ENCOUNTER — Telehealth: Payer: Self-pay | Admitting: Neurology

## 2016-07-17 NOTE — Telephone Encounter (Addendum)
Pt's daughter called today and said the medicine caused him to fall scraping the skin off his arm. She said he got a pair of scissors and cut the skin off which caused a staph infection. This went to his heart, affected his defibrillator and he died. Pt's daughter said "he was started on the medication right off the bat at 200mg ". I asked what was the name of the medication, she said "I don't know, it starts with an L". She then repeated it over again, I apologized for her loss. She said it is ok, he is in heaven but she sounded angry/upset.

## 2016-07-17 NOTE — Telephone Encounter (Signed)
Pt's daughter said the medicine caused him to fall scrapping the skin off his arm. She said he got a pair of scissors and cut the skin off which caused a staph infection. This went to his heart, effected his defibrillator and he died. Pt's daughter said "he was started on the medication right off the bat at 200mg ". I asked what was the name of the medication, she said "I don't know, it starts with an L. She then repeated it over again, I apologized for her loss.

## 2016-07-17 NOTE — Telephone Encounter (Addendum)
error 

## 2016-07-29 ENCOUNTER — Ambulatory Visit: Payer: Medicare HMO | Admitting: Cardiovascular Disease

## 2016-10-02 ENCOUNTER — Ambulatory Visit: Payer: Medicare HMO | Admitting: Neurology
